# Patient Record
Sex: Male | Born: 1960 | Race: White | Hispanic: No | Smoking: Current every day smoker
Health system: Southern US, Community
[De-identification: ages and names within clinical notes are randomized; demographics above are authoritative.]

## PROBLEM LIST (undated history)

## (undated) DIAGNOSIS — F1020 Alcohol dependence, uncomplicated: Secondary | ICD-10-CM

## (undated) DIAGNOSIS — M069 Rheumatoid arthritis, unspecified: Secondary | ICD-10-CM

## (undated) DIAGNOSIS — E7800 Pure hypercholesterolemia, unspecified: Secondary | ICD-10-CM

## (undated) DIAGNOSIS — K7460 Unspecified cirrhosis of liver: Secondary | ICD-10-CM

## (undated) DIAGNOSIS — M1990 Unspecified osteoarthritis, unspecified site: Secondary | ICD-10-CM

## (undated) DIAGNOSIS — J849 Interstitial pulmonary disease, unspecified: Secondary | ICD-10-CM

## (undated) DIAGNOSIS — R0600 Dyspnea, unspecified: Secondary | ICD-10-CM

## (undated) DIAGNOSIS — I2510 Atherosclerotic heart disease of native coronary artery without angina pectoris: Secondary | ICD-10-CM

## (undated) DIAGNOSIS — I10 Essential (primary) hypertension: Secondary | ICD-10-CM

## (undated) DIAGNOSIS — F419 Anxiety disorder, unspecified: Secondary | ICD-10-CM

## (undated) DIAGNOSIS — J8410 Pulmonary fibrosis, unspecified: Secondary | ICD-10-CM

## (undated) HISTORY — DX: Atherosclerotic heart disease of native coronary artery without angina pectoris: I25.10

## (undated) HISTORY — DX: Unspecified cirrhosis of liver: K74.60

## (undated) HISTORY — PX: KNEE CARTILAGE SURGERY: SHX688

## (undated) HISTORY — DX: Alcohol dependence, uncomplicated: F10.20

## (undated) HISTORY — DX: Interstitial pulmonary disease, unspecified: J84.9

## (undated) HISTORY — DX: Essential (primary) hypertension: I10

## (undated) HISTORY — PX: JOINT REPLACEMENT: SHX530

---

## 2002-07-16 ENCOUNTER — Ambulatory Visit (HOSPITAL_BASED_OUTPATIENT_CLINIC_OR_DEPARTMENT_OTHER): Admission: RE | Admit: 2002-07-16 | Discharge: 2002-07-16 | Payer: Self-pay | Admitting: Orthopedic Surgery

## 2002-09-28 ENCOUNTER — Emergency Department (HOSPITAL_COMMUNITY): Admission: EM | Admit: 2002-09-28 | Discharge: 2002-09-28 | Payer: Self-pay | Admitting: Emergency Medicine

## 2009-06-10 ENCOUNTER — Emergency Department (HOSPITAL_COMMUNITY): Admission: EM | Admit: 2009-06-10 | Discharge: 2009-06-10 | Payer: Self-pay | Admitting: Emergency Medicine

## 2010-04-11 LAB — DIFFERENTIAL
Basophils Absolute: 0.1 10*3/uL (ref 0.0–0.1)
Basophils Relative: 1 % (ref 0–1)
Lymphs Abs: 3.1 10*3/uL (ref 0.7–4.0)
Monocytes Absolute: 0.6 10*3/uL (ref 0.1–1.0)
Neutro Abs: 8.3 10*3/uL — ABNORMAL HIGH (ref 1.7–7.7)
Neutrophils Relative %: 68 % (ref 43–77)

## 2010-04-11 LAB — URINALYSIS, ROUTINE W REFLEX MICROSCOPIC
Bilirubin Urine: NEGATIVE
Glucose, UA: NEGATIVE mg/dL
Hgb urine dipstick: NEGATIVE
Ketones, ur: NEGATIVE mg/dL
Leukocytes, UA: NEGATIVE
Nitrite: NEGATIVE
Protein, ur: 30 mg/dL — AB
Specific Gravity, Urine: 1.026 (ref 1.005–1.030)
Urobilinogen, UA: 1 mg/dL (ref 0.0–1.0)
pH: 8 (ref 5.0–8.0)

## 2010-04-11 LAB — URINE MICROSCOPIC-ADD ON

## 2010-04-11 LAB — RAPID URINE DRUG SCREEN, HOSP PERFORMED
Amphetamines: NOT DETECTED
Barbiturates: NOT DETECTED
Benzodiazepines: NOT DETECTED
Cocaine: NOT DETECTED
Opiates: NOT DETECTED
Tetrahydrocannabinol: POSITIVE — AB

## 2010-04-11 LAB — CBC
Hemoglobin: 16.3 g/dL (ref 13.0–17.0)
MCHC: 34 g/dL (ref 30.0–36.0)
Platelets: 182 10*3/uL (ref 150–400)
WBC: 12.3 10*3/uL — ABNORMAL HIGH (ref 4.0–10.5)

## 2010-04-11 LAB — BASIC METABOLIC PANEL
BUN: 12 mg/dL (ref 6–23)
CO2: 25 mEq/L (ref 19–32)
Calcium: 9.4 mg/dL (ref 8.4–10.5)
Chloride: 107 mEq/L (ref 96–112)
Creatinine, Ser: 0.87 mg/dL (ref 0.4–1.5)
GFR calc Af Amer: >60 mL/min (ref >60)
GFR calc non Af Amer: >60 mL/min (ref >60)
Glucose, Bld: 105 mg/dL — ABNORMAL HIGH (ref 70–99)
Potassium: 3.4 mEq/L — ABNORMAL LOW (ref 3.5–5.1)
Sodium: 139 mEq/L (ref 135–145)

## 2010-04-11 LAB — POCT CARDIAC MARKERS: CKMB, poc: 1.9 ng/mL (ref 1.0–8.0)

## 2010-06-10 NOTE — Op Note (Signed)
NAME:  Robert Greene, Robert Greene                       ACCOUNT NO.:  [id number]   MEDICAL RECORD NO.:  [id number]                   PATIENT TYPE:  AMB   LOCATION:  DSC                                  FACILITY:  [facility]   PHYSICIAN:  Feliberto Gottron. Turner Daniels, M.D.                DATE OF BIRTH:  02-19-1960   DATE OF PROCEDURE:  07/16/2002  DATE OF DISCHARGE:                                 OPERATIVE REPORT   PREOPERATIVE DIAGNOSES:  Left knee medial meniscal tear.   POSTOPERATIVE DIAGNOSES:  Left knee bucket handle medial meniscal tear plus  osteochondral loose body.   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   SURGEON:  Feliberto Gottron. Turner Daniels, M.D.   ASSISTANTLaural Benes. Jannet Mantis.   ANESTHESIA:  General endotracheal.   ESTIMATED BLOOD LOSS:  Minimal.   FLUIDS REPLACED:  crystalloids.   DRAINS:  None.   TOURNIQUET TIME:  None.   INDICATIONS FOR PROCEDURE:  The patient is a 50 year old man who presented  to our office yesterday with a locked knee consistent with a bucket handle  tear of the medial meniscus.  Some years ago he had had a large open  procedure done by Dr. Orland Jarred, I believe this was done 30 years ago,  probably a medial meniscectomy.  York Spaniel he was around 14 when it was done, but  he doesn't recall the exact nature of the operation.  In any event, over the  last few days, he spontaneously developed a locked knee with exquisite  medial joint line pain and what feels like a palpable either stump of a  meniscus or loose body on the medial side of the joint.  He is taken for  arthroscopic evaluation and treatment of his knee.  Preoperative radiographs  were consistent with very mild grade 1 [place] changes, good maintenance  of articular cartilage height and only some squaring off of the medial  tibial and femoral  condyles.   DESCRIPTION OF PROCEDURE:  The patient was identified by arm band and take  to the operating room at [facility].  Appropriate  anesthetic monitors were attached.  General endotracheal was induced with  the patient in the supine position.  A lateral post was applied to the table  and the left lower extremity prepped and draped in the usual sterile fashion  from the ankle to the midthigh.  Using a #11 blade, standard inferomedial  and inferolateral peripatellar portals were then made allowing introduction  of the arthroscope through the inferolateral portal and outflow through the  inferomedial portal.  We immediately encountered some hypertrophic synovium  and what appeared to be a fibrous band along the tract of where you would  normally expect the medial plica.  The patella and trochlea themselves were  in good condition.  When we cleaned the synovium off of what appeared to be  a fibrous band, it actually turned out to be a bucket handle tear of the  medial meniscus that tracked all the way down into the notch and medially  around the medial femoral condyle.  This was then removed with a 4.2 great  white sucker shaver.  Moving down the medial femoral condyle __________ were  identified an osteochondral loose body that was about 6 x 7 x 4 mm in size.  Still on its bed on the medial femoral condyle.  Far medial at the junction  of the synovium and the cartilage and this was also removed with an  arthroscopic rasper using the medial portal.  The ACL and the PCL were noted  to be intact.  The lateral compartment was in excellent condition.  The  gutters were cleared.  The lateral meniscus was in good condition.  The  scope was used to clear the posterior compartments going lateral to the PCL.  At this point the knee was washed out with normal saline solution.  The  arthroscopic instruments were removed.  A dressing of Xeroform, 4 x 4  dressing sponges, Webril  and Ace wrap was applied.  The patient was awakened  and taken to the recovery room without difficulty.                                                 Feliberto Gottron. Turner Daniels, M.D.    Ovid Curd  D:  07/16/2002  T:  07/17/2002  Job:  [id number]

## 2010-11-19 ENCOUNTER — Emergency Department (HOSPITAL_COMMUNITY)
Admission: EM | Admit: 2010-11-19 | Discharge: 2010-11-19 | Disposition: A | Discharge status: Home / Self Care | Payer: PRIVATE HEALTH INSURANCE | Attending: Emergency Medicine | Admitting: Emergency Medicine

## 2010-11-19 DIAGNOSIS — T887XXA Unspecified adverse effect of drug or medicament, initial encounter: Secondary | ICD-10-CM | POA: Insufficient documentation

## 2010-11-19 DIAGNOSIS — I10 Essential (primary) hypertension: Secondary | ICD-10-CM | POA: Insufficient documentation

## 2010-11-19 DIAGNOSIS — F17200 Nicotine dependence, unspecified, uncomplicated: Secondary | ICD-10-CM | POA: Insufficient documentation

## 2013-05-12 ENCOUNTER — Emergency Department (HOSPITAL_COMMUNITY)
Admission: EM | Admit: 2013-05-12 | Discharge: 2013-05-12 | Disposition: A | Discharge status: Home / Self Care | Payer: PRIVATE HEALTH INSURANCE | Attending: Emergency Medicine | Admitting: Emergency Medicine

## 2013-05-12 ENCOUNTER — Encounter (HOSPITAL_COMMUNITY): Payer: Self-pay | Admitting: Emergency Medicine

## 2013-05-12 DIAGNOSIS — I10 Essential (primary) hypertension: Secondary | ICD-10-CM | POA: Insufficient documentation

## 2013-05-12 DIAGNOSIS — L500 Allergic urticaria: Secondary | ICD-10-CM | POA: Insufficient documentation

## 2013-05-12 DIAGNOSIS — Z79899 Other long term (current) drug therapy: Secondary | ICD-10-CM | POA: Insufficient documentation

## 2013-05-12 DIAGNOSIS — F10929 Alcohol use, unspecified with intoxication, unspecified: Secondary | ICD-10-CM

## 2013-05-12 DIAGNOSIS — F1010 Alcohol abuse, uncomplicated: Secondary | ICD-10-CM | POA: Insufficient documentation

## 2013-05-12 DIAGNOSIS — E7800 Pure hypercholesterolemia, unspecified: Secondary | ICD-10-CM | POA: Insufficient documentation

## 2013-05-12 DIAGNOSIS — F17200 Nicotine dependence, unspecified, uncomplicated: Secondary | ICD-10-CM | POA: Insufficient documentation

## 2013-05-12 DIAGNOSIS — T7840XA Allergy, unspecified, initial encounter: Secondary | ICD-10-CM

## 2013-05-12 HISTORY — DX: Essential (primary) hypertension: I10

## 2013-05-12 HISTORY — DX: Pure hypercholesterolemia, unspecified: E78.00

## 2013-05-12 MED ORDER — PREDNISONE 50 MG PO TABS: 50.0000 mg | ORAL_TABLET | Freq: Every day | ORAL | Status: DC

## 2013-05-12 MED ORDER — DIPHENHYDRAMINE HCL 50 MG/ML IJ SOLN
25.0000 mg | Freq: Once | INTRAMUSCULAR | Status: AC
  Administered 2013-05-12: 25 mg via INTRAVENOUS
  Filled 2013-05-12: qty 1

## 2013-05-12 MED ORDER — METHYLPREDNISOLONE SODIUM SUCC 125 MG IJ SOLR: 125.0000 mg | Freq: Once | INTRAMUSCULAR | Status: DC

## 2013-05-12 MED ORDER — DIPHENHYDRAMINE HCL 25 MG PO TABS: 50.0000 mg | ORAL_TABLET | ORAL | Status: DC | PRN

## 2013-05-12 MED ORDER — METHYLPREDNISOLONE SODIUM SUCC 125 MG IJ SOLR
125.0000 mg | Freq: Once | INTRAMUSCULAR | Status: AC
  Administered 2013-05-12: 125 mg via INTRAVENOUS
  Filled 2013-05-12: qty 2

## 2013-05-12 NOTE — ED Provider Notes (Signed)
CSN: [id number]     Arrival date & time 05/12/13  0120 History   First MD Initiated Contact with Patient 05/12/13 0158     Chief Complaint  Patient presents with  . Allergic Reaction  . Urticaria  . Insect Bite     (Consider location/radiation/quality/duration/timing/severity/associated sxs/prior Treatment) HPI Patient admits to drinking heavily this evening. He sat on the curb around 10 PM. He thinks he was bitten by an insect or spider. He develop a raised rash to the right posterior thigh that is itching. He had no shortness of breath or wheezing. He states he did not have his EpiPen so they called EMS. Paramedics state patient Benadryl and Zantac. He states the itching and rash has since improved significantly. He continues to have no respiratory difficulties. Past Medical History  Diagnosis Date  . Hypertension   . Hypercholesteremia    History reviewed. No pertinent past surgical history. No family history on file. History  Substance Use Topics  . Smoking status: Current Every Day Smoker  . Smokeless tobacco: Not on file  . Alcohol Use: Yes    Review of Systems  Constitutional: Negative for fever and chills.  HENT: Negative for facial swelling, trouble swallowing and voice change.   Respiratory: Negative for shortness of breath and stridor.   Cardiovascular: Negative for chest pain.  Gastrointestinal: Negative for nausea, vomiting and abdominal pain.  Musculoskeletal: Negative for back pain and neck stiffness.  Skin: Positive for rash. Negative for wound.  Neurological: Negative for dizziness, weakness, light-headedness, numbness and headaches.  All other systems reviewed and are negative.     Allergies  Spider antivenin  Home Medications   Prior to Admission medications   Medication Sig Start Date End Date Taking? Authorizing Provider  EPINEPHrine (EPIPEN) 0.3 mg/0.3 mL SOAJ injection Inject 0.3 mg into the muscle once.   Yes Historical Provider, MD   lisinopril (PRINIVIL,ZESTRIL) 20 MG tablet Take 20 mg by mouth daily.   Yes Historical Provider, MD  Omega-3 Fatty Acids (FISH OIL PO) Take 2-4 capsules by mouth daily.   Yes Historical Provider, MD  simvastatin (ZOCOR) 80 MG tablet Take 80 mg by mouth daily.   Yes Historical Provider, MD   BP 147/94  Pulse 92  Temp(Src) 98.2 F (36.8 C) (Oral)  Resp 20  SpO2 97% Physical Exam  Nursing note and vitals reviewed. Constitutional: He is oriented to person, place, and time. He appears well-developed and well-nourished. No distress.  HENT:  Head: Normocephalic and atraumatic.  Mouth/Throat: Oropharynx is clear and moist.  No intraoral swelling  Eyes: EOM are normal. Pupils are equal, round, and reactive to light.  Neck: Normal range of motion. Neck supple.  Cardiovascular: Normal rate and regular rhythm.   Pulmonary/Chest: Effort normal and breath sounds normal. No respiratory distress. He has no wheezes. He has no rales. He exhibits no tenderness.  Abdominal: Soft. Bowel sounds are normal. He exhibits no distension and no mass. There is no tenderness. There is no rebound and no guarding.  Musculoskeletal: Normal range of motion. He exhibits no edema and no tenderness.  Neurological: He is alert and oriented to person, place, and time.  Clinically intoxicated. Slurring words. Moves all extremities without deficit.  Skin: Skin is warm and dry. Rash noted. No erythema.  Raised erythematous rash to the posterior right thigh. No evidence of infection.  Psychiatric: He has a normal mood and affect. His behavior is normal.    ED Course  Procedures (including critical care time)  Labs Review Labs Reviewed - No data to display  Imaging Review No results found.   EKG Interpretation None      MDM   Final diagnoses:  None      Patient is now much more alert. He's been observed in the emergency department for some time. He has no stridor or difficulty breathing. His rash is  improved. Return precautions have been given. Patient will be discharged home with family.  Julianne Rice, MD 05/12/13 [phone]

## 2013-05-12 NOTE — Discharge Instructions (Signed)
Alcohol Intoxication °Alcohol intoxication occurs when the amount of alcohol that a person has consumed impairs his or her ability to mentally and physically function. Alcohol directly impairs the normal chemical activity of the brain. Drinking large amounts of alcohol can lead to changes in mental function and behavior, and it can cause many physical effects that can be harmful.  °Alcohol intoxication can range in severity from mild to very severe. Various factors can affect the level of intoxication that occurs, such as the person's age, gender, weight, frequency of alcohol consumption, and the presence of other medical conditions (such as diabetes, seizures, or heart conditions). Dangerous levels of alcohol intoxication may occur when people drink large amounts of alcohol in a short period (binge drinking). Alcohol can also be especially dangerous when combined with certain prescription medicines or "recreational" drugs. °SIGNS AND SYMPTOMS °Some common signs and symptoms of mild alcohol intoxication include: °· Loss of coordination. °· Changes in mood and behavior. °· Impaired judgment. °· Slurred speech. °As alcohol intoxication progresses to more severe levels, other signs and symptoms will appear. These may include: °· Vomiting. °· Confusion and impaired memory. °· Slowed breathing. °· Seizures. °· Loss of consciousness. °DIAGNOSIS  °Your health care provider will take a medical history and perform a physical exam. You will be asked about the amount and type of alcohol you have consumed. Blood tests will be done to measure the concentration of alcohol in your blood. In many places, your blood alcohol level must be lower than 80 mg/dL (0.08%) to legally drive. However, many dangerous effects of alcohol can occur at much lower levels.  °TREATMENT  °People with alcohol intoxication often do not require treatment. Most of the effects of alcohol intoxication are temporary, and they go away as the alcohol naturally  leaves the body. Your health care provider will monitor your condition until you are stable enough to go home. Fluids are sometimes given through an IV access tube to help prevent dehydration.  °HOME CARE INSTRUCTIONS °· Do not drive after drinking alcohol. °· Stay hydrated. Drink enough water and fluids to keep your urine clear or pale yellow. Avoid caffeine.   °· Only take over-the-counter or prescription medicines as directed by your health care provider.   °SEEK MEDICAL CARE IF:  °· You have persistent vomiting.   °· You do not feel better after a few days. °· You have frequent alcohol intoxication. Your health care provider can help determine if you should see a substance use treatment counselor. °SEEK IMMEDIATE MEDICAL CARE IF:  °· You become shaky or tremble when you try to stop drinking.   °· You shake uncontrollably (seizure).   °· You throw up (vomit) blood. This may be bright red or may look like black coffee grounds.   °· You have blood in your stool. This may be bright red or may appear as a black, tarry, bad smelling stool.   °· You become lightheaded or faint.   °MAKE SURE YOU:  °· Understand these instructions. °· Will watch your condition. °· Will get help right away if you are not doing well or get worse. °Document Released: 10/19/2004 Document Revised: 09/11/2012 Document Reviewed: 06/14/2012 °ExitCare® Patient Information ©2014 [employer]. °Allergies °Allergies may happen from anything your body is sensitive to. This may be food, medicines, pollens, chemicals, and nearly anything around you in everyday life that produces allergens. An allergen is anything that causes an allergy producing substance. Heredity is often a factor in causing these problems. This means you may have some of the   same allergies as your parents. Food allergies happen in all age groups. Food allergies are some of the most severe and life threatening. Some common food allergies are cow's milk, seafood, eggs, nuts, wheat,  and soybeans. SYMPTOMS   Swelling around the mouth.  An itchy red rash or hives.  Vomiting or diarrhea.  Difficulty breathing. SEVERE ALLERGIC REACTIONS ARE LIFE-THREATENING. This reaction is called anaphylaxis. It can cause the mouth and throat to swell and cause difficulty with breathing and swallowing. In severe reactions only a trace amount of food (for example, peanut oil in a salad) may cause death within seconds. Seasonal allergies occur in all age groups. These are seasonal because they usually occur during the same season every year. They may be a reaction to molds, grass pollens, or tree pollens. Other causes of problems are house dust mite allergens, pet dander, and mold spores. The symptoms often consist of nasal congestion, a runny itchy nose associated with sneezing, and tearing itchy eyes. There is often an associated itching of the mouth and ears. The problems happen when you come in contact with pollens and other allergens. Allergens are the particles in the air that the body reacts to with an allergic reaction. This causes you to release allergic antibodies. Through a chain of events, these eventually cause you to release histamine into the blood stream. Although it is meant to be protective to the body, it is this release that causes your discomfort. This is why you were given anti-histamines to feel better. If you are unable to pinpoint the offending allergen, it may be determined by skin or blood testing. Allergies cannot be cured but can be controlled with medicine. Hay fever is a collection of all or some of the seasonal allergy problems. It may often be treated with simple over-the-counter medicine such as diphenhydramine. Take medicine as directed. Do not drink alcohol or drive while taking this medicine. Check with your caregiver or package insert for child dosages. If these medicines are not effective, there are many new medicines your caregiver can prescribe. Stronger  medicine such as nasal spray, eye drops, and corticosteroids may be used if the first things you try do not work well. Other treatments such as immunotherapy or desensitizing injections can be used if all else fails. Follow up with your caregiver if problems continue. These seasonal allergies are usually not life threatening. They are generally more of a nuisance that can often be handled using medicine. HOME CARE INSTRUCTIONS   If unsure what causes a reaction, keep a diary of foods eaten and symptoms that follow. Avoid foods that cause reactions.  If hives or rash are present:  Take medicine as directed.  You may use an over-the-counter antihistamine (diphenhydramine) for hives and itching as needed.  Apply cold compresses (cloths) to the skin or take baths in cool water. Avoid hot baths or showers. Heat will make a rash and itching worse.  If you are severely allergic:  Following a treatment for a severe reaction, hospitalization is often required for closer follow-up.  Wear a medic-alert bracelet or necklace stating the allergy.  You and your family must learn how to give adrenaline or use an anaphylaxis kit.  If you have had a severe reaction, always carry your anaphylaxis kit or EpiPen with you. Use this medicine as directed by your caregiver if a severe reaction is occurring. Failure to do so could have a fatal outcome. SEEK MEDICAL CARE IF:  You suspect a food allergy. Symptoms generally  happen within 30 minutes of eating a food. °· Your symptoms have not gone away within 2 days or are getting worse. °· You develop new symptoms. °· You want to retest yourself or your child with a food or drink you think causes an allergic reaction. Never do this if an anaphylactic reaction to that food or drink has happened before. Only do this under the care of a caregiver. °SEEK IMMEDIATE MEDICAL CARE IF:  °· You have difficulty breathing, are wheezing, or have a tight feeling in your chest or  throat. °· You have a swollen mouth, or you have hives, swelling, or itching all over your body. °· You have had a severe reaction that has responded to your anaphylaxis kit or an EpiPen®. These reactions may return when the medicine has worn off. These reactions should be considered life threatening. °MAKE SURE YOU:  °· Understand these instructions. °· Will watch your condition. °· Will get help right away if you are not doing well or get worse. °Document Released: 04/04/2002 Document Revised: 05/06/2012 Document Reviewed: 09/09/2007 °ExitCare® Patient Information ©2014 [employer]. ° °

## 2013-05-12 NOTE — ED Notes (Signed)
Pt states that he has used an epi pen before due to spider bite. Pt states that tonight he believes he sat on a spider and has a big welt. States that he could not find his epi pen and was nervous that he would stop breathing and so called EMS. States that he is feeling a lot better now in the room. States that he has drank a pint of liquor today.

## 2013-05-12 NOTE — ED Notes (Signed)
Dr  Yelverton in room.  

## 2013-05-12 NOTE — ED Notes (Addendum)
Here by EMS for allergic reaction, reports sudden onset of hives, redness and itching. Was working in yard and sat on a wall, thinks he got bit by a spider. Also admits to drinking a pint of liquor and smoking a cigarette prior to sx onset. Severe hives and itching upon EMS arrival. NSL 20g placed in L hand and benadryl and zantac given. Sx improved upon arrival to ED. H/o same type of reaction. Has an epi pen but did not use tonight (it is out of date).pt intoxicated, talkative, repetitive. LS CTA. Throat unremarkable.   ("Pt also reports having a friend upstairs on a ventilator who is suppose to be taken of of vent tomorrow. That is why he was drinking and he would like to see that pt & or the family upstairs").

## 2013-07-25 ENCOUNTER — Emergency Department (HOSPITAL_COMMUNITY)
Admission: EM | Admit: 2013-07-25 | Discharge: 2013-07-25 | Disposition: A | Discharge status: Home / Self Care | Payer: PRIVATE HEALTH INSURANCE | Attending: Emergency Medicine | Admitting: Emergency Medicine

## 2013-07-25 ENCOUNTER — Encounter (HOSPITAL_COMMUNITY): Payer: Self-pay | Admitting: Emergency Medicine

## 2013-07-25 DIAGNOSIS — I10 Essential (primary) hypertension: Secondary | ICD-10-CM | POA: Insufficient documentation

## 2013-07-25 DIAGNOSIS — Y9389 Activity, other specified: Secondary | ICD-10-CM | POA: Insufficient documentation

## 2013-07-25 DIAGNOSIS — T63461A Toxic effect of venom of wasps, accidental (unintentional), initial encounter: Secondary | ICD-10-CM | POA: Insufficient documentation

## 2013-07-25 DIAGNOSIS — F17200 Nicotine dependence, unspecified, uncomplicated: Secondary | ICD-10-CM | POA: Insufficient documentation

## 2013-07-25 DIAGNOSIS — Z79899 Other long term (current) drug therapy: Secondary | ICD-10-CM | POA: Insufficient documentation

## 2013-07-25 DIAGNOSIS — T63441A Toxic effect of venom of bees, accidental (unintentional), initial encounter: Secondary | ICD-10-CM

## 2013-07-25 DIAGNOSIS — IMO0002 Reserved for concepts with insufficient information to code with codable children: Secondary | ICD-10-CM | POA: Insufficient documentation

## 2013-07-25 DIAGNOSIS — E7800 Pure hypercholesterolemia, unspecified: Secondary | ICD-10-CM | POA: Insufficient documentation

## 2013-07-25 DIAGNOSIS — T6391XA Toxic effect of contact with unspecified venomous animal, accidental (unintentional), initial encounter: Secondary | ICD-10-CM | POA: Insufficient documentation

## 2013-07-25 DIAGNOSIS — Y9289 Other specified places as the place of occurrence of the external cause: Secondary | ICD-10-CM | POA: Insufficient documentation

## 2013-07-25 MED ORDER — PREDNISONE 20 MG PO TABS: ORAL_TABLET | ORAL | Status: DC

## 2013-07-25 MED ORDER — METHYLPREDNISOLONE SODIUM SUCC 125 MG IJ SOLR
125.0000 mg | Freq: Once | INTRAMUSCULAR | Status: AC
  Administered 2013-07-25: 125 mg via INTRAVENOUS
  Filled 2013-07-25: qty 2

## 2013-07-25 MED ORDER — FAMOTIDINE 20 MG PO TABS: 20.0000 mg | ORAL_TABLET | Freq: Two times a day (BID) | ORAL | Status: DC

## 2013-07-25 MED ORDER — DIPHENHYDRAMINE HCL 25 MG PO TABS: 50.0000 mg | ORAL_TABLET | ORAL | Status: DC | PRN

## 2013-07-25 MED ORDER — SODIUM CHLORIDE 0.9 % IV BOLUS (SEPSIS)
1000.0000 mL | Freq: Once | INTRAVENOUS | Status: AC
  Administered 2013-07-25: 1000 mL via INTRAVENOUS

## 2013-07-25 MED ORDER — DIPHENHYDRAMINE HCL 50 MG/ML IJ SOLN
25.0000 mg | Freq: Once | INTRAMUSCULAR | Status: AC
  Administered 2013-07-25: 25 mg via INTRAVENOUS
  Filled 2013-07-25: qty 1

## 2013-07-25 MED ORDER — HYDROMORPHONE HCL PF 1 MG/ML IJ SOLN
0.5000 mg | Freq: Once | INTRAMUSCULAR | Status: AC
  Administered 2013-07-25: 14:00:00 via INTRAVENOUS
  Filled 2013-07-25: qty 1

## 2013-07-25 MED ORDER — FAMOTIDINE IN NACL 20-0.9 MG/50ML-% IV SOLN
20.0000 mg | Freq: Once | INTRAVENOUS | Status: AC
  Administered 2013-07-25: 20 mg via INTRAVENOUS
  Filled 2013-07-25: qty 50

## 2013-07-25 MED ORDER — EPINEPHRINE 0.3 MG/0.3ML IJ SOAJ
0.3000 mg | Freq: Once | INTRAMUSCULAR | Status: AC
  Administered 2013-07-25: 0.3 mg via INTRAMUSCULAR
  Filled 2013-07-25: qty 0.3

## 2013-07-25 MED ORDER — ONDANSETRON HCL 4 MG/2ML IJ SOLN
4.0000 mg | Freq: Once | INTRAMUSCULAR | Status: AC
  Administered 2013-07-25: 4 mg via INTRAVENOUS
  Filled 2013-07-25: qty 2

## 2013-07-25 NOTE — ED Notes (Signed)
Pt anxious, asking to go home.  Explained we need to continue to observe him for a while to ensure safe discharge home.  Swelling to face much improved, lips normal, ear lobes only slightly swollen.  Hives less pronounced but remain.

## 2013-07-25 NOTE — ED Provider Notes (Signed)
CSN: [id number]     Arrival date & time 07/25/13  1159 History   First MD Initiated Contact with Patient 07/25/13 1202     Chief Complaint  Patient presents with  . Allergic Reaction     (Consider location/radiation/quality/duration/timing/severity/associated sxs/prior Treatment) HPI  53 year old male with history of bee sting reaction who presents for evaluation of bee sting. Approximately 2-1/2 hours ago patient was outside fixing in a condition when he got stung by multiple bees. Reports things was to his right lower leg. Subsequently patient developed his, itchiness, and chills. Patient received 1 EpiPen injection and 50 of Benadryl prior to arrival. Currently patient complains of hives and chills, but denies any significant headache, throat swelling, tongue swelling, trouble breathing, nausea vomiting diarrhea with exception of some mild abdominal cramping.    Past Medical History  Diagnosis Date  . Hypertension   . Hypercholesteremia    No past surgical history on file. No family history on file. History  Substance Use Topics  . Smoking status: Current Every Day Smoker  . Smokeless tobacco: Not on file  . Alcohol Use: Yes    Review of Systems  All other systems reviewed and are negative.     Allergies  Spider antivenin  Home Medications   Prior to Admission medications   Medication Sig Start Date End Date Taking? Authorizing Provider  diphenhydrAMINE (BENADRYL) 25 MG tablet Take 2 tablets (50 mg total) by mouth every 4 (four) hours as needed for itching. 05/12/13   Julianne Rice, MD  EPINEPHrine (EPIPEN) 0.3 mg/0.3 mL SOAJ injection Inject 0.3 mg into the muscle once.    Historical Provider, MD  lisinopril (PRINIVIL,ZESTRIL) 20 MG tablet Take 20 mg by mouth daily.    Historical Provider, MD  Omega-3 Fatty Acids (FISH OIL PO) Take 2-4 capsules by mouth daily.    Historical Provider, MD  predniSONE (DELTASONE) 50 MG tablet Take 1 tablet (50 mg total) by mouth daily.  05/12/13   Julianne Rice, MD  simvastatin (ZOCOR) 80 MG tablet Take 80 mg by mouth daily.    Historical Provider, MD   There were no vitals taken for this visit. Physical Exam  Constitutional: He is oriented to person, place, and time. He appears well-developed and well-nourished. No distress.  HENT:  Head: Atraumatic.  No mucosal swelling, no tongue swelling, no lip swelling noted  Eyes: Conjunctivae are normal.  Neck: Normal range of motion. Neck supple.  Cardiovascular: Normal rate and regular rhythm.   Pulmonary/Chest: Effort normal and breath sounds normal. He has no wheezes.  Abdominal: Soft. There is no tenderness.  Musculoskeletal: He exhibits no edema.  Neurological: He is alert and oriented to person, place, and time.  Skin: No rash (pruritic hives noted throughout body.) noted.  Psychiatric: He has a normal mood and affect.    ED Course  Procedures (including critical care time)  Patient presents with acute allergic reaction to bee sting. No identified stinger noted on exam. Patient has pruritic hives throughout body without signs of airway compromise.  Mild abdominal cramping, which suggest some evidence of anaphylaxis. He did receive EpiPen injection prior to arrival. Will give Solu-Medrol, Benadryl, Pepcid, IV fluid, and we'll closely monitor. Discussed with Dr. Aline Brochure  1:39 PM Pt now complaining of face tightness and having body aches.  No obvious signs of airway compromise on reexamination mild edema noted to L lower lip and bilateral ear lobes.  will give 0.3 epi IM and continue to monitor.    5:18 PM  Patient received a total of 3 intramuscular epinephrine along with Solu-Medrol, Benadryl, and Pepcid. Patient was monitored for 5 hours with improvement. Patient felt stable for discharge. He is currently without any airway compromise. He does have EpiPen at home that is up-to-date. Patient was made aware to use EpiPen as needed and to also come to the ER promptly if he  get stung again. He agrees. Return precautions discussed.  CRITICAL CARE Performed by: Domenic Moras Total critical care time: 1hr Critical care time was exclusive of separately billable procedures and treating other patients. Critical care was necessary to treat or prevent imminent or life-threatening deterioration. Critical care was time spent personally by me on the following activities: development of treatment plan with patient and/or surrogate as well as nursing, discussions with consultants, evaluation of patient's response to treatment, examination of patient, obtaining history from patient or surrogate, ordering and performing treatments and interventions, ordering and review of laboratory studies, ordering and review of radiographic studies, pulse oximetry and re-evaluation of patient's condition.   Labs Review Labs Reviewed - No data to display  Imaging Review No results found.   EKG Interpretation   Date/Time:  Friday July 25 2013 12:32:28 EDT Ventricular Rate:  68 PR Interval:  161 QRS Duration: 78 QT Interval:  401 QTC Calculation: 426 R Axis:   49 Text Interpretation:  Sinus rhythm Left atrial enlargement Anteroseptal  infarct, old Confirmed by HARRISON  MD, [place] ([id number]) on 07/25/2013  12:34:57 PM      MDM   Final diagnoses:  Bee sting reaction, accidental or unintentional, initial encounter  Anaphylactic reaction to bee sting, accidental or unintentional, initial encounter    BP 121/72  Pulse 74  Temp(Src) 98.1 F (36.7 C) (Oral)  Resp 15  Ht 5\' 10"  (1.778 m)  Wt 205 lb (92.987 kg)  BMI 29.41 kg/m2  SpO2 98%     Domenic Moras, PA-C 07/25/13 1720

## 2013-07-25 NOTE — ED Notes (Signed)
Stung by a bee posterior right leg, epi pen used at home and took 50mg  benadryl prior to EMS arrival.  Hives noted all over body, pt shakey.

## 2013-07-25 NOTE — ED Notes (Signed)
Pt face more swollen, ear lobes very puffy, eyes more swollen.  Dr Aline Brochure into room.

## 2013-07-25 NOTE — Discharge Instructions (Signed)

## 2013-07-25 NOTE — ED Notes (Signed)
Swelling subsiding minimally; pt feels his lips are less tingling. Continues to deny respiratory distress.  No signs of abnormal respirations noted.

## 2013-07-25 NOTE — ED Notes (Signed)
Skin normal in color, minimal ear lobe swelling remains.  Lungs clear.  Pt more than ready to go home.

## 2013-07-25 NOTE — ED Notes (Signed)
C/O bee sting at 0930.  Went home and used epi pen, then took benadryl.  Felt better then suddenly became worse, called 911.  Face swollen, feels as though mouth is swollen, hives cover body.  C/O knot epigastric area.  Suddenly felt nausea, medication given here.

## 2013-07-26 ENCOUNTER — Emergency Department (HOSPITAL_COMMUNITY)
Admission: EM | Admit: 2013-07-26 | Discharge: 2013-07-26 | Disposition: A | Discharge status: Home / Self Care | Payer: PRIVATE HEALTH INSURANCE | Attending: Emergency Medicine | Admitting: Emergency Medicine

## 2013-07-26 ENCOUNTER — Encounter (HOSPITAL_COMMUNITY): Payer: Self-pay | Admitting: Emergency Medicine

## 2013-07-26 DIAGNOSIS — T63441S Toxic effect of venom of bees, accidental (unintentional), sequela: Secondary | ICD-10-CM

## 2013-07-26 DIAGNOSIS — E7800 Pure hypercholesterolemia, unspecified: Secondary | ICD-10-CM | POA: Insufficient documentation

## 2013-07-26 DIAGNOSIS — T63461A Toxic effect of venom of wasps, accidental (unintentional), initial encounter: Secondary | ICD-10-CM | POA: Insufficient documentation

## 2013-07-26 DIAGNOSIS — IMO0002 Reserved for concepts with insufficient information to code with codable children: Secondary | ICD-10-CM | POA: Insufficient documentation

## 2013-07-26 DIAGNOSIS — Y939 Activity, unspecified: Secondary | ICD-10-CM | POA: Insufficient documentation

## 2013-07-26 DIAGNOSIS — Z79899 Other long term (current) drug therapy: Secondary | ICD-10-CM | POA: Insufficient documentation

## 2013-07-26 DIAGNOSIS — Y929 Unspecified place or not applicable: Secondary | ICD-10-CM | POA: Insufficient documentation

## 2013-07-26 DIAGNOSIS — I10 Essential (primary) hypertension: Secondary | ICD-10-CM | POA: Insufficient documentation

## 2013-07-26 DIAGNOSIS — T6391XA Toxic effect of contact with unspecified venomous animal, accidental (unintentional), initial encounter: Secondary | ICD-10-CM | POA: Insufficient documentation

## 2013-07-26 DIAGNOSIS — F17200 Nicotine dependence, unspecified, uncomplicated: Secondary | ICD-10-CM | POA: Insufficient documentation

## 2013-07-26 LAB — CBC WITH DIFFERENTIAL/PLATELET
BASOS ABS: 0 10*3/uL (ref 0.0–0.1)
Basophils Relative: 0 % (ref 0–1)
EOS PCT: 0 % (ref 0–5)
Eosinophils Absolute: 0 10*3/uL (ref 0.0–0.7)
HCT: 47.3 % (ref 39.0–52.0)
Hemoglobin: 16.2 g/dL (ref 13.0–17.0)
Lymphocytes Relative: 13 % (ref 12–46)
Lymphs Abs: 2.4 10*3/uL (ref 0.7–4.0)
MCH: 31.6 pg (ref 26.0–34.0)
MCHC: 34.2 g/dL (ref 30.0–36.0)
MCV: 92.2 fL (ref 78.0–100.0)
Monocytes Absolute: 0.7 10*3/uL (ref 0.1–1.0)
Monocytes Relative: 4 % (ref 3–12)
Neutro Abs: 15.2 10*3/uL — ABNORMAL HIGH (ref 1.7–7.7)
Neutrophils Relative %: 83 % — ABNORMAL HIGH (ref 43–77)
Platelets: 155 10*3/uL (ref 150–400)
RBC: 5.13 MIL/uL (ref 4.22–5.81)
RDW: 13.2 % (ref 11.5–15.5)
WBC: 18.4 10*3/uL — ABNORMAL HIGH (ref 4.0–10.5)

## 2013-07-26 LAB — BASIC METABOLIC PANEL
ANION GAP: 13 (ref 5–15)
BUN: 13 mg/dL (ref 6–23)
CALCIUM: 9.3 mg/dL (ref 8.4–10.5)
CO2: 23 mEq/L (ref 19–32)
CREATININE: 0.63 mg/dL (ref 0.50–1.35)
Chloride: 103 mEq/L (ref 96–112)
GFR calc Af Amer: >90 mL/min (ref >90)
GFR calc non Af Amer: >90 mL/min (ref >90)
Glucose, Bld: 109 mg/dL — ABNORMAL HIGH (ref 70–99)
Potassium: 4.4 mEq/L (ref 3.7–5.3)
Sodium: 139 mEq/L (ref 137–147)

## 2013-07-26 MED ORDER — SODIUM CHLORIDE 0.9 % IV SOLN
INTRAVENOUS | Status: DC
Start: 2013-07-26 — End: 2013-07-26
  Administered 2013-07-26: 15:00:00 via INTRAVENOUS

## 2013-07-26 MED ORDER — EPINEPHRINE 0.3 MG/0.3ML IJ SOAJ
0.3000 mg | Freq: Once | INTRAMUSCULAR | Status: AC
Start: 2013-07-26 — End: 2013-07-26
  Administered 2013-07-26: 0.3 mg via INTRAMUSCULAR

## 2013-07-26 MED ORDER — SODIUM CHLORIDE 0.9 % IV BOLUS (SEPSIS)
1000.0000 mL | Freq: Once | INTRAVENOUS | Status: AC
  Administered 2013-07-26: 1000 mL via INTRAVENOUS

## 2013-07-26 MED ORDER — FAMOTIDINE IN NACL 20-0.9 MG/50ML-% IV SOLN
20.0000 mg | Freq: Once | INTRAVENOUS | Status: AC
  Administered 2013-07-26: 20 mg via INTRAVENOUS
  Filled 2013-07-26: qty 50

## 2013-07-26 MED ORDER — DIPHENHYDRAMINE HCL 50 MG/ML IJ SOLN
25.0000 mg | Freq: Once | INTRAMUSCULAR | Status: AC
  Administered 2013-07-26: 25 mg via INTRAVENOUS
  Filled 2013-07-26: qty 1

## 2013-07-26 NOTE — ED Provider Notes (Signed)
Medical screening examination/treatment/procedure(s) were conducted as a shared visit with non-physician practitioner(s) and myself.  I personally evaluated the patient during the encounter.   EKG Interpretation   Date/Time:  Friday July 25 2013 12:32:28 EDT Ventricular Rate:  68 PR Interval:  161 QRS Duration: 78 QT Interval:  401 QTC Calculation: 426 R Axis:   49 Text Interpretation:  Sinus rhythm Left atrial enlargement Anteroseptal  infarct, old Confirmed by Sibel Khurana  MD, [place] ([id number]) on 07/25/2013  12:34:57 PM      I interviewed and examined the patient. Lungs are CTAB. Cardiac exam wnl. Abdomen soft. Diffuse urticarial rash. Gi cramping on my exam. Pt gave him self epi IM pta. On exam he began developing swelling of his lips, ears, eye lids. He got a total of 3 IM epi's. He was monitored for 5 hours. Rash and swelling resolving. Pt has an epi pen at home. He continues to appear well. Strong return precautions given.    Blanchard Kelch, MD 07/26/13 [phone]

## 2013-07-26 NOTE — Discharge Instructions (Signed)
Anaphylactic Reaction °An anaphylactic reaction is a sudden, severe allergic reaction that involves the whole body. It can be life threatening. A hospital stay is often required. People with asthma, eczema, or hay fever are slightly more likely to have an anaphylactic reaction. °CAUSES  °An anaphylactic reaction may be caused by anything to which you are allergic. After being exposed to the allergic substance, your immune system becomes sensitized to it. When you are exposed to that allergic substance again, an allergic reaction can occur. Common causes of an anaphylactic reaction include: °· Medicines. °· Foods, especially peanuts, wheat, shellfish, milk, and eggs. °· Insect bites or stings. °· Blood products. °· Chemicals, such as dyes, latex, and contrast material used for imaging tests. °SYMPTOMS  °When an allergic reaction occurs, the body releases histamine and other substances. These substances cause symptoms such as tightening of the airway. Symptoms often develop within seconds or minutes of exposure. Symptoms may include: °· Skin rash or hives. °· Itching. °· Chest tightness. °· Swelling of the eyes, tongue, or lips. °· Trouble breathing or swallowing. °· Lightheadedness or fainting. °· Anxiety or confusion. °· Stomach pains, vomiting, or diarrhea. °· Nasal congestion. °· A fast or irregular heartbeat (palpitations). °DIAGNOSIS  °Diagnosis is based on your history of recent exposure to allergic substances, your symptoms, and a physical exam. Your caregiver may also perform blood or urine tests to confirm the diagnosis. °TREATMENT  °Epinephrine medicine is the main treatment for an anaphylactic reaction. Other medicines that may be used for treatment include antihistamines, steroids, and albuterol. In severe cases, fluids and medicine to support blood pressure may be given through an intravenous line (IV). Even if you improve after treatment, you need to be observed to make sure your condition does not get  worse. This may require a stay in the hospital. °HOME CARE INSTRUCTIONS  °· Wear a medical alert bracelet or necklace stating your allergy. °· You and your family must learn how to use an anaphylaxis kit or give an epinephrine injection to temporarily treat an emergency allergic reaction. Always carry your epinephrine injection or anaphylaxis kit with you. This can be lifesaving if you have a severe reaction. °· Do not drive or perform tasks after treatment until the medicines used to treat your reaction have worn off, or until your caregiver says it is okay. °· If you have hives or a rash: °¨ Take medicines as directed by your caregiver. °¨ You may use an over-the-counter antihistamine (diphenhydramine) as needed. °¨ Apply cold compresses to the skin or take baths in cool water. Avoid hot baths or showers. °SEEK MEDICAL CARE IF:  °· You develop symptoms of an allergic reaction to a new substance. Symptoms may start right away or minutes later. °· You develop a rash, hives, or itching. °· You develop new symptoms. °SEEK IMMEDIATE MEDICAL CARE IF:  °· You have swelling of the mouth, difficulty breathing, or wheezing. °· You have a tight feeling in your chest or throat. °· You develop hives, swelling, or itching all over your body. °· You develop severe vomiting or diarrhea. °· You feel faint or pass out. °This is an emergency. Use your epinephrine injection or anaphylaxis kit as you have been instructed. Call your local emergency services (911 in [place] Even if you improve after the injection, you need to be examined at a hospital emergency department. °MAKE SURE YOU:  °· Understand these instructions. °· Will watch your condition. °· Will get help right away if you are not   doing well or get worse. Document Released: 01/09/2005 Document Revised: 01/14/2013 Document Reviewed: 04/12/2011 Exit[employer] Patient Information 2015 [employer]. This information is not intended to replace advice given to you by your health  care provider. Make sure you discuss any questions you have with your health care provider. Epinephrine Injection Epinephrine is a medicine given by injection to temporarily treat an emergency allergic reaction. It is also used to treat severe asthmatic attacks and other lung problems. The medicine helps to enlarge (dilate) the small breathing tubes of the lungs. A life-threatening, sudden allergic reaction that involves the whole body is called anaphylaxis. Because of potential side effects, epinephrine should only be used as directed by your caregiver. RISKS AND COMPLICATIONS Possible side effects of epinephrine injections include:  Chest pain.  Irregular or rapid heartbeat.  Shortness of breath.  Nausea.  Vomiting.  Abdominal pain or cramping.  Sweating.  Dizziness.  Weakness.  Headache.  Nervousness. Report all side effects to your caregiver. HOW TO GIVE AN EPINEPHRINE INJECTION Give the epinephrine injection immediately when symptoms of a severe reaction begin. Inject the medicine into the outer thigh or any available, large muscle. Your caregiver can teach you how to do this. You do not need to remove any clothing. After the injection, call your local emergency services (911 in [place] Even if you improve after the injection, you need to be examined at a hospital emergency department. Epinephrine works quickly, but it also wears off quickly. Delayed reactions can occur. A delayed reaction may be as serious and dangerous as the initial reaction. HOME CARE INSTRUCTIONS  Make sure you and your family know how to give an epinephrine injection.  Use epinephrine injections as directed by your caregiver. Do not use this medicine more often or in larger doses than prescribed.  Always carry your epinephrine injection or anaphylaxis kit with you. This can be lifesaving if you have a severe reaction.  Store the medicine in a cool, dry place. If the medicine becomes discolored or  cloudy, dispose of it properly and replace it with new medicine.  Check the expiration date on your medicine. It may be unsafe to use medicines past their expiration date.  Tell your caregiver about any other medicines you are taking. Some medicines can react badly with epinephrine.  Tell your caregiver about any medical conditions you have, such as diabetes, high blood pressure (hypertension), heart disease, irregular heartbeats, or if you are pregnant. SEEK IMMEDIATE MEDICAL CARE IF:  You have used an epinephrine injection. Call your local emergency services (911 in [place] Even if you improve after the injection, you need to be examined at a hospital emergency department to make sure your allergic reaction is under control. You will also be monitored for adverse effects from the medicine.  You have chest pain.  You have irregular or fast heartbeats.  You have shortness of breath.  You have severe headaches.  You have severe nausea, vomiting, or abdominal cramps.  You have severe pain, swelling, or redness in the area where you gave the injection. Document Released: 01/07/2000 Document Revised: 04/03/2011 Document Reviewed: 09/28/2010 [employer] Patient Information 2015 [employer]. This information is not intended to replace advice given to you by your health care provider. Make sure you discuss any questions you have with your health care provider.  

## 2013-07-26 NOTE — ED Notes (Signed)
Pt in via EMS, in c/o allergic reaction, pt seen for same yesterday after a bee sting, given epi three times during stay yesterday, today had recurrence of symptoms, first noted hives and rash, went to fire station and EMS was called, noted hoarseness to his voice and noted airway swelling- given 0.15mg  of EPI, 5mg  albuterol, 50mg  benadryl, 50mg  zantac, and 125mg  solumedrol. Hoarseness noted on arrival with continued oral swelling which does not look improved via EMS. PA to bedside.

## 2013-07-26 NOTE — ED Provider Notes (Signed)
CSN: [id number]     Arrival date & time 07/26/13  1135 History   First MD Initiated Contact with Patient 07/26/13 1140     Chief Complaint  Patient presents with  . Allergic Reaction     (Consider location/radiation/quality/duration/timing/severity/associated sxs/prior Treatment) HPI  53 year old male brought in via EMS for worsening allergic reaction. Patient was seen yesterday in ER by me for evaluation of an allergic reaction to bee sting. He had a mild anaphylactic reaction, and received 3 separate dose of epinephrine along with cadre of meds including Benadryl, Pepcid, IV fluid, Solu-Medrol. He was monitored for 5 hours and was improving. He was discharge with strong return precautions. He was discharged with prescription for steroid, pepcid, benadryl but did not fill his meds.  Patient report he woke up this morning not feeling well, tightness sensation around the throat and noticed that his voice was hoarse. He then noticed return of his hives and itchiness. He then went to the closest fire station and EMS was called. Prior to arrival patient received 0.15 mg epi, 5 mg of albuterol, 50 mg of Benadryl, and 50 mg of Zantac along with 125 mg of soluMedrol.  He is currently complaining of difficulty breathing, feeling nauseous, and tightness throughout. No reexposure to bee sting or any other offending agent.  Hx limited due to acuity of the pt's condition.    Past Medical History  Diagnosis Date  . Hypertension   . Hypercholesteremia   . Hypercholesteremia    Past Surgical History  Procedure Laterality Date  . Knee arthroplasty Left    History reviewed. No pertinent family history. History  Substance Use Topics  . Smoking status: Current Every Day Smoker -- 1.50 packs/day    Types: Cigarettes  . Smokeless tobacco: Not on file  . Alcohol Use: 0.5 oz/week    1 drink(s) per week    Review of Systems  Unable to perform ROS: Acuity of condition      Allergies  Bee venom and  Spider antivenin  Home Medications   Prior to Admission medications   Medication Sig Start Date End Date Taking? Authorizing Provider  diphenhydrAMINE (BENADRYL) 25 MG tablet Take 2 tablets (50 mg total) by mouth every 4 (four) hours as needed for itching or allergies. 07/25/13   Domenic Moras, PA-C  EPINEPHrine (EPIPEN) 0.3 mg/0.3 mL SOAJ injection Inject 0.3 mg into the muscle once.    Historical Provider, MD  famotidine (PEPCID) 20 MG tablet Take 1 tablet (20 mg total) by mouth 2 (two) times daily. 07/25/13   Domenic Moras, PA-C  lisinopril (PRINIVIL,ZESTRIL) 20 MG tablet Take 20 mg by mouth daily.    Historical Provider, MD  Omega-3 Fatty Acids (FISH OIL PO) Take 2 capsules by mouth 2 (two) times daily.     Historical Provider, MD  predniSONE (DELTASONE) 20 MG tablet 2 tabs po daily x 4 days 07/25/13   Domenic Moras, PA-C  simvastatin (ZOCOR) 80 MG tablet Take 80 mg by mouth daily.    Historical Provider, MD   BP 160/98  Pulse 63  Resp 22  SpO2 99% Physical Exam  Nursing note and vitals reviewed. Constitutional: He is oriented to person, place, and time.  Patient is sitting upright, hoarseness noted in mild distress.  HENT:  Head: Normocephalic and atraumatic.  Mild lip edema, and tongue edema. No stridor. Hoarsed voice  Eyes: Conjunctivae are normal.  Neck: No tracheal deviation present.  Cardiovascular: Normal rate and regular rhythm.   Pulmonary/Chest: Effort normal  and breath sounds normal. He has no wheezes.  Abdominal: Soft. There is no tenderness.  Neurological: He is alert and oriented to person, place, and time.  Skin:  The skin is flushed, hives noted throughout body less intense than yesterday.    ED Course  Procedures (including critical care time)  12:00 PM Pt with anaphylactic reaction from bee sting from yesterday, improves with treatment in ER but now worsen this AM.  On exam pt has mild resp distress, with some airway compromise given hoarsed voice and mild lip and tongue  edema.  Pt is hemodynamically stable, normal heart rate.  Appropriate treatment was given prior to arrival.  IVF continues in ER.  Another dose of 0.3mg  Epi IM given promptly upon arrival.  Worsening of sxs likely because pt did not continue with his steroid treatment as previously prescribed.    1:00 PM No worsening of sxs after reexamination.  Plan to monitor for the next 4-6 hrs.  Care discussed with Dr. Roderic Palau.   Patient has elevated WBC of 18.4. This is likely secondary to stress reaction and recent steroid use.  2:56 PM Pt felt better, voice is improving but not back to normal, however hives has nearly resolved.  Will continue to monitor.  Will continue with IVF, benadryl/pepcid.  5:03 PM Patient state he felt much better he feels comfortable returning home. Patient agrees to resume his medication previously prescribed. He is aware to return promptly if his symptoms worsen. He is to follow with his PCP for further management.  Labs Review Labs Reviewed  CBC WITH DIFFERENTIAL - Abnormal; Notable for the following:    WBC 18.4 (*)    Neutrophils Relative % 83 (*)    Neutro Abs 15.2 (*)    All other components within normal limits  BASIC METABOLIC PANEL - Abnormal; Notable for the following:    Glucose, Bld 109 (*)    All other components within normal limits    Imaging Review No results found.   EKG Interpretation None      MDM   Final diagnoses:  Bee sting-induced anaphylaxis, accidental or unintentional, sequela    BP 131/62  Pulse 78  Resp 19  SpO2 98%     Domenic Moras, PA-C 07/26/13 1704

## 2013-07-26 NOTE — ED Provider Notes (Signed)
Medical screening examination/treatment/procedure(s) were conducted as a shared visit with non-physician practitioner(s) and myself.  I personally evaluated the patient during the encounter.   EKG Interpretation None      Pt complains of an allergic reaction.  pe rash to torso with swelling to face  Maudry Diego, MD 07/26/13 1945

## 2015-04-02 ENCOUNTER — Emergency Department (HOSPITAL_COMMUNITY)
Admission: EM | Admit: 2015-04-02 | Discharge: 2015-04-03 | Disposition: A | Discharge status: Home / Self Care | Payer: PRIVATE HEALTH INSURANCE | Attending: Emergency Medicine | Admitting: Emergency Medicine

## 2015-04-02 ENCOUNTER — Encounter (HOSPITAL_COMMUNITY): Payer: Self-pay | Admitting: Oncology

## 2015-04-02 DIAGNOSIS — E7800 Pure hypercholesterolemia, unspecified: Secondary | ICD-10-CM | POA: Diagnosis not present

## 2015-04-02 DIAGNOSIS — Z7952 Long term (current) use of systemic steroids: Secondary | ICD-10-CM | POA: Insufficient documentation

## 2015-04-02 DIAGNOSIS — I10 Essential (primary) hypertension: Secondary | ICD-10-CM | POA: Insufficient documentation

## 2015-04-02 DIAGNOSIS — H578 Other specified disorders of eye and adnexa: Secondary | ICD-10-CM | POA: Diagnosis present

## 2015-04-02 DIAGNOSIS — R531 Weakness: Secondary | ICD-10-CM | POA: Diagnosis not present

## 2015-04-02 DIAGNOSIS — F17210 Nicotine dependence, cigarettes, uncomplicated: Secondary | ICD-10-CM | POA: Insufficient documentation

## 2015-04-02 DIAGNOSIS — I6521 Occlusion and stenosis of right carotid artery: Secondary | ICD-10-CM

## 2015-04-02 DIAGNOSIS — Z79899 Other long term (current) drug therapy: Secondary | ICD-10-CM | POA: Insufficient documentation

## 2015-04-02 NOTE — ED Notes (Signed)
Pt reports attempting to go to work today however reports that he, "couldn't function."  "Didn't feel right."  Pt cannot expound on sx.  Does report difficulty keeping right eye open.  Speech is clear, grips equal b/l, face is symmetrical.

## 2015-04-03 ENCOUNTER — Emergency Department (HOSPITAL_COMMUNITY): Payer: PRIVATE HEALTH INSURANCE

## 2015-04-03 LAB — DIFFERENTIAL
BASOS ABS: 0 10*3/uL (ref 0.0–0.1)
Basophils Relative: 0 %
EOS ABS: 0.2 10*3/uL (ref 0.0–0.7)
Eosinophils Relative: 2 %
LYMPHS ABS: 5.5 10*3/uL — AB (ref 0.7–4.0)
Lymphocytes Relative: 54 %
MONO ABS: 0.7 10*3/uL (ref 0.1–1.0)
MONOS PCT: 7 %
Neutro Abs: 3.8 10*3/uL (ref 1.7–7.7)
Neutrophils Relative %: 37 %

## 2015-04-03 LAB — COMPREHENSIVE METABOLIC PANEL
ALT: 52 U/L (ref 17–63)
AST: 48 U/L — AB (ref 15–41)
Albumin: 4.1 g/dL (ref 3.5–5.0)
Alkaline Phosphatase: 71 U/L (ref 38–126)
Anion gap: 12 (ref 5–15)
BILIRUBIN TOTAL: 0.3 mg/dL (ref 0.3–1.2)
BUN: 12 mg/dL (ref 6–20)
CO2: 23 mmol/L (ref 22–32)
CREATININE: 0.67 mg/dL (ref 0.61–1.24)
Calcium: 9 mg/dL (ref 8.9–10.3)
Chloride: 108 mmol/L (ref 101–111)
GFR calc Af Amer: >60 mL/min (ref >60)
Glucose, Bld: 111 mg/dL — ABNORMAL HIGH (ref 65–99)
POTASSIUM: 3.5 mmol/L (ref 3.5–5.1)
Sodium: 143 mmol/L (ref 135–145)
TOTAL PROTEIN: 7.1 g/dL (ref 6.5–8.1)

## 2015-04-03 LAB — CBC
HEMATOCRIT: 45.6 % (ref 39.0–52.0)
HEMOGLOBIN: 15.5 g/dL (ref 13.0–17.0)
MCH: 32 pg (ref 26.0–34.0)
MCHC: 34 g/dL (ref 30.0–36.0)
MCV: 94 fL (ref 78.0–100.0)
Platelets: 191 10*3/uL (ref 150–400)
RBC: 4.85 MIL/uL (ref 4.22–5.81)
RDW: 13.7 % (ref 11.5–15.5)
WBC: 10.1 10*3/uL (ref 4.0–10.5)

## 2015-04-03 LAB — I-STAT CHEM 8, ED
BUN: 11 mg/dL (ref 6–20)
CALCIUM ION: 1.09 mmol/L — AB (ref 1.12–1.23)
Chloride: 106 mmol/L (ref 101–111)
Creatinine, Ser: 1 mg/dL (ref 0.61–1.24)
GLUCOSE: 108 mg/dL — AB (ref 65–99)
HCT: 49 % (ref 39.0–52.0)
HEMOGLOBIN: 16.7 g/dL (ref 13.0–17.0)
Potassium: 3.5 mmol/L (ref 3.5–5.1)
Sodium: 143 mmol/L (ref 135–145)
TCO2: 24 mmol/L (ref 0–100)

## 2015-04-03 LAB — CBG MONITORING, ED: GLUCOSE-CAPILLARY: 100 mg/dL — AB (ref 65–99)

## 2015-04-03 LAB — PROTIME-INR
INR: 1.02 (ref 0.00–1.49)
Prothrombin Time: 13.2 seconds (ref 11.6–15.2)

## 2015-04-03 LAB — APTT: APTT: 28 s (ref 24–37)

## 2015-04-03 MED ORDER — IOHEXOL 350 MG/ML SOLN
100.0000 mL | Freq: Once | INTRAVENOUS | Status: AC | PRN
  Administered 2015-04-03: 100 mL via INTRAVENOUS

## 2015-04-03 MED ORDER — ASPIRIN 81 MG PO CHEW
324.0000 mg | CHEWABLE_TABLET | Freq: Once | ORAL | Status: AC
  Administered 2015-04-03: 324 mg via ORAL
  Filled 2015-04-03: qty 4

## 2015-04-03 NOTE — ED Provider Notes (Signed)
CSN: [id number]     Arrival date & time 04/02/15  2305 History   None    Chief Complaint  Patient presents with  . Eye Problem     (Consider location/radiation/quality/duration/timing/severity/associated sxs/prior Treatment) Patient is a 55 y.o. male presenting with neurologic complaint. The history is provided by the patient.  Neurologic Problem This is a new problem. The current episode started today. The problem has been resolved. Associated symptoms include a visual change and weakness. Nothing aggravates the symptoms. He has tried nothing for the symptoms. The treatment provided no relief.  Pt reports at 10 am on 3/10 he felt weak, was unable to drive.  Pt reports difficulty with depth perception, weakness.  Pt reports trouble getting right eyelid to stay up.   Pt reports symptoms have resolved now. Pt has a history of htn, diabetes, hypercholesteremia,   Past Medical History  Diagnosis Date  . Hypertension   . Hypercholesteremia   . Hypercholesteremia    Past Surgical History  Procedure Laterality Date  . Knee arthroplasty Left    History reviewed. No pertinent family history. Social History  Substance Use Topics  . Smoking status: Current Every Day Smoker -- 1.50 packs/day    Types: Cigarettes  . Smokeless tobacco: Never Used  . Alcohol Use: 0.5 oz/week    1 Standard drinks or equivalent per week    Review of Systems  Neurological: Positive for weakness.  All other systems reviewed and are negative.     Allergies  Bee venom and Spider antivenin  Home Medications   Prior to Admission medications   Medication Sig Start Date End Date Taking? Authorizing Provider  atorvastatin (LIPITOR) 40 MG tablet 1 TABLET ONCE A DAY ORALLY 30 DAY(S) 03/04/15  Yes Historical Provider, MD  diphenhydrAMINE (BENADRYL) 25 MG tablet Take 25 mg by mouth every 4 (four) hours as needed for itching.   Yes Historical Provider, MD  lisinopril (PRINIVIL,ZESTRIL) 20 MG tablet Take 20 mg by  mouth daily.   Yes Historical Provider, MD  Omega-3 Fatty Acids (FISH OIL PO) Take 2 capsules by mouth 2 (two) times daily.    Yes Historical Provider, MD  predniSONE (DELTASONE) 5 MG tablet Take 5 mg by mouth daily. 03/22/15  Yes Historical Provider, MD  ranitidine (ZANTAC) 150 MG tablet Take 150 mg by mouth 2 (two) times daily.   Yes Historical Provider, MD  EPINEPHrine (EPIPEN) 0.3 mg/0.3 mL SOAJ injection Inject 0.3 mg into the muscle once.    Historical Provider, MD   BP 130/85 mmHg  Pulse 68  Temp(Src) 98.3 F (36.8 C) (Oral)  Resp 15  Ht 5\' 10"  (1.778 m)  Wt 97.523 kg  BMI 30.85 kg/m2  SpO2 98% Physical Exam  Constitutional: He is oriented to person, place, and time. He appears well-developed and well-nourished.  HENT:  Head: Normocephalic.  Right Ear: External ear normal.  Left Ear: External ear normal.  Nose: Nose normal.  Mouth/Throat: Oropharynx is clear and moist.  Eyes: Conjunctivae and EOM are normal. Pupils are equal, round, and reactive to light.  Neck: Normal range of motion.  Cardiovascular: Normal rate and normal heart sounds.   Pulmonary/Chest: Effort normal and breath sounds normal.  Abdominal: Soft. He exhibits no distension.  Musculoskeletal: Normal range of motion.  Neurological: He is alert and oriented to person, place, and time.  Skin: Skin is warm.  Psychiatric: He has a normal mood and affect.  Nursing note and vitals reviewed.   ED Course  Procedures (including  critical care time) Labs Review Labs Reviewed  DIFFERENTIAL - Abnormal; Notable for the following:    Lymphs Abs 5.5 (*)    All other components within normal limits  COMPREHENSIVE METABOLIC PANEL - Abnormal; Notable for the following:    Glucose, Bld 111 (*)    AST 48 (*)    All other components within normal limits  CBG MONITORING, ED - Abnormal; Notable for the following:    Glucose-Capillary 100 (*)    All other components within normal limits  I-STAT CHEM 8, ED - Abnormal;  Notable for the following:    Glucose, Bld 108 (*)    Calcium, Ion 1.09 (*)    All other components within normal limits  PROTIME-INR  APTT  CBC    Imaging Review Ct Angio Head W/cm &/or Wo Cm  04/03/2015  CLINICAL DATA:  Initial evaluation for acute confusion. EXAM: CT ANGIOGRAPHY HEAD AND NECK TECHNIQUE: Multidetector CT imaging of the head and neck was performed using the standard protocol during bolus administration of intravenous contrast. Multiplanar CT image reconstructions and MIPs were obtained to evaluate the vascular anatomy. Carotid stenosis measurements (when applicable) are obtained utilizing NASCET criteria, using the distal internal carotid diameter as the denominator. CONTRAST:  134mL OMNIPAQUE IOHEXOL 350 MG/ML SOLN COMPARISON:  Prior CT from earlier the same day. FINDINGS: FINDINGS CTA NECK Aortic arch: Visualized aortic arch of normal caliber with normal branch pattern. No high-grade stenosis seen at the origin of the great vessels. Minimal plaque at the origin of the left subclavian artery noted. Visualized subclavian arteries well opacified distally. Right carotid system: Right common carotid artery patent from its origin to the bifurcation. Scattered atheromatous plaque about the right carotid bifurcation/proximal right ICA. Stenosis of approximately 40-50% by NASCET criteria. Right ICA widely patent distally to the skullbase. No evidence for dissection, high-grade flow-limiting stenosis, or vascular occlusion within the right rib carotid are system. Atheromatous plaque at the origin of the right external carotid artery as well. Right external carotid artery and its branches otherwise normal in appearance. Left carotid system: Left common carotid artery widely patent from its origin to the bifurcation. Scattered calcified atheromatous plaque about the left carotid bifurcation without significant narrowing. Left ICA well opacified distally to the skullbase. No dissection,  flow-limiting stenosis, or vascular occlusion within the left carotid artery system. Vertebral arteries:Both vertebral arteries arise from the subclavian arteries. Focal plaque at the origin of the right vertebral artery with a least moderate stenosis. Vertebral arteries are otherwise well opacified along their entire course without evidence for dissection, stenosis, or occlusion. Vertebral arteries are somewhat diffusely diminutive bilaterally. Skeleton: No acute osseous abnormality. No worrisome lytic or blastic osseous lesions. Straightening with slight reversal the normal cervical lordosis. Mild degenerative spondylolysis present at C5-6 and C6-7. Other neck: Visualized lungs are grossly clear. Paraseptal and centrilobular emphysema noted. Visualized superior mediastinum within normal limits. Thyroid normal. No adenopathy within the neck. No acute soft tissue abnormality. CTA HEAD Anterior circulation: Petrous segments are widely patent bilaterally. Scattered calcified atheromatous plaque within the cavernous ICAs with secondary mild to moderate multi focal narrowing A1 segments patent. Anterior communicating artery normal. Anterior cerebral arteries well opacified to their distal aspects. M1 segments patent without stenosis or occlusion. MCA bifurcations normal. Distal MCA branches well opacified and symmetric. Posterior circulation: Vertebral arteries are somewhat diminutive but widely patent to the vertebrobasilar junction. Posterior inferior cerebral arteries patent. Basilar artery diminutive but patent to its distal aspect. Superior cerebellar arteries patent bilaterally. P1  segments markedly hypoplastic, particularly on the left. Prominent bilateral posterior communicating arteries. PCAs well opacified to their distal aspect without stenosis or proximal occlusion. Venous sinuses: Patent without evidence for venous sinus thrombosis. Anatomic variants: Diminutive vertebrobasilar system with PCA supplied  mainly via posterior communicating arteries. No aneurysm. Delayed phase: No pathologic enhancement. IMPRESSION: CTA NECK IMPRESSION: 1. No acute abnormality within the major arterial vasculature of the neck. 2. Atheromatous plaque about the proximal right ICA with short-segment stenosis of approximately 40-50% by NASCET criteria. 3. Mild atheromatous plaque about the left carotid bifurcation without significant stenosis. 4. Patent vertebral arteries within the neck. CTA HEAD IMPRESSION: 1. No large or proximal arterial branch occlusion within the intracranial circulation. No high-grade or correctable stenosis. 2. Atheromatous plaque within the cavernous ICAs bilaterally with secondary mild to moderate multi focal narrowing. Electronically Signed   By: Jeannine Boga M.D.   On: 04/03/2015 05:23   Ct Head Wo Contrast  04/03/2015  CLINICAL DATA:  TIA symptoms at 10 a.m. yesterday. Patient reports "He did not feel right" EXAM: CT HEAD WITHOUT CONTRAST TECHNIQUE: Contiguous axial images were obtained from the base of the skull through the vertex without intravenous contrast. COMPARISON:  06/10/2009 FINDINGS: No intracranial hemorrhage, mass effect, or midline shift. No hydrocephalus. The basilar cisterns are patent. No evidence of territorial infarct. No intracranial fluid collection. Calvarium is intact. Included paranasal sinuses and mastoid air cells are well aerated. IMPRESSION: No acute intracranial abnormality. Electronically Signed   By: Jeb Levering M.D.   On: 04/03/2015 03:17   Ct Angio Neck W/cm &/or Wo/cm  04/03/2015  CLINICAL DATA:  Initial evaluation for acute confusion. EXAM: CT ANGIOGRAPHY HEAD AND NECK TECHNIQUE: Multidetector CT imaging of the head and neck was performed using the standard protocol during bolus administration of intravenous contrast. Multiplanar CT image reconstructions and MIPs were obtained to evaluate the vascular anatomy. Carotid stenosis measurements (when  applicable) are obtained utilizing NASCET criteria, using the distal internal carotid diameter as the denominator. CONTRAST:  111mL OMNIPAQUE IOHEXOL 350 MG/ML SOLN COMPARISON:  Prior CT from earlier the same day. FINDINGS: FINDINGS CTA NECK Aortic arch: Visualized aortic arch of normal caliber with normal branch pattern. No high-grade stenosis seen at the origin of the great vessels. Minimal plaque at the origin of the left subclavian artery noted. Visualized subclavian arteries well opacified distally. Right carotid system: Right common carotid artery patent from its origin to the bifurcation. Scattered atheromatous plaque about the right carotid bifurcation/proximal right ICA. Stenosis of approximately 40-50% by NASCET criteria. Right ICA widely patent distally to the skullbase. No evidence for dissection, high-grade flow-limiting stenosis, or vascular occlusion within the right rib carotid are system. Atheromatous plaque at the origin of the right external carotid artery as well. Right external carotid artery and its branches otherwise normal in appearance. Left carotid system: Left common carotid artery widely patent from its origin to the bifurcation. Scattered calcified atheromatous plaque about the left carotid bifurcation without significant narrowing. Left ICA well opacified distally to the skullbase. No dissection, flow-limiting stenosis, or vascular occlusion within the left carotid artery system. Vertebral arteries:Both vertebral arteries arise from the subclavian arteries. Focal plaque at the origin of the right vertebral artery with a least moderate stenosis. Vertebral arteries are otherwise well opacified along their entire course without evidence for dissection, stenosis, or occlusion. Vertebral arteries are somewhat diffusely diminutive bilaterally. Skeleton: No acute osseous abnormality. No worrisome lytic or blastic osseous lesions. Straightening with slight reversal the normal cervical lordosis.  Mild degenerative spondylolysis present at C5-6 and C6-7. Other neck: Visualized lungs are grossly clear. Paraseptal and centrilobular emphysema noted. Visualized superior mediastinum within normal limits. Thyroid normal. No adenopathy within the neck. No acute soft tissue abnormality. CTA HEAD Anterior circulation: Petrous segments are widely patent bilaterally. Scattered calcified atheromatous plaque within the cavernous ICAs with secondary mild to moderate multi focal narrowing A1 segments patent. Anterior communicating artery normal. Anterior cerebral arteries well opacified to their distal aspects. M1 segments patent without stenosis or occlusion. MCA bifurcations normal. Distal MCA branches well opacified and symmetric. Posterior circulation: Vertebral arteries are somewhat diminutive but widely patent to the vertebrobasilar junction. Posterior inferior cerebral arteries patent. Basilar artery diminutive but patent to its distal aspect. Superior cerebellar arteries patent bilaterally. P1 segments markedly hypoplastic, particularly on the left. Prominent bilateral posterior communicating arteries. PCAs well opacified to their distal aspect without stenosis or proximal occlusion. Venous sinuses: Patent without evidence for venous sinus thrombosis. Anatomic variants: Diminutive vertebrobasilar system with PCA supplied mainly via posterior communicating arteries. No aneurysm. Delayed phase: No pathologic enhancement. IMPRESSION: CTA NECK IMPRESSION: 1. No acute abnormality within the major arterial vasculature of the neck. 2. Atheromatous plaque about the proximal right ICA with short-segment stenosis of approximately 40-50% by NASCET criteria. 3. Mild atheromatous plaque about the left carotid bifurcation without significant stenosis. 4. Patent vertebral arteries within the neck. CTA HEAD IMPRESSION: 1. No large or proximal arterial branch occlusion within the intracranial circulation. No high-grade or  correctable stenosis. 2. Atheromatous plaque within the cavernous ICAs bilaterally with secondary mild to moderate multi focal narrowing. Electronically Signed   By: Jeannine Boga M.D.   On: 04/03/2015 05:23   I have personally reviewed and evaluated these images and lab results as part of my medical decision-making.   EKG Interpretation   Date/Time:  Saturday April 03 2015 02:10:58 EST Ventricular Rate:  60 PR Interval:  189 QRS Duration: 93 QT Interval:  428 QTC Calculation: 428 R Axis:   65 Text Interpretation:  Sinus rhythm Consider left ventricular hypertrophy  Confirmed by [facility]  MD, Emmaline Kluver ([id number]) on 04/03/2015 2:56:40 AM      MDM I spoke to Dr. Leonel Ramsay Neurology  who advised ct angio head and neck, if normal aspirin therapy.  I discussed results with Dr. Leonel Ramsay.   Pt counseled on results.   Final diagnoses:  Transient cerebral ischemia, unspecified transient cerebral ischemia type  Bilateral carotid artery disease ([place])    Meds ordered this encounter  Medications  . predniSONE (DELTASONE) 5 MG tablet    Sig: Take 5 mg by mouth daily.    Refill:  1  . atorvastatin (LIPITOR) 40 MG tablet    Sig: 1 TABLET ONCE A DAY ORALLY 30 DAY(S)    Refill:  4  . ranitidine (ZANTAC) 150 MG tablet    Sig: Take 150 mg by mouth 2 (two) times daily.  Marland Kitchen aspirin chewable tablet 324 mg    Sig:   . iohexol (OMNIPAQUE) 350 MG/ML injection 100 mL    Sig:     An After Visit Summary was printed and given to the patient.  Hollace Kinnier [place], PA-C 04/03/15 [id number]  April Palumbo, MD 04/03/15 [phone]

## 2015-04-03 NOTE — Discharge Instructions (Signed)
°Carotid Artery Disease °The carotid arteries are the two main arteries on either side of the neck that supply blood to the brain. Carotid artery disease, also called carotid artery stenosis, is the narrowing or blockage of one or both carotid arteries. Carotid artery disease increases your risk for a stroke or a transient ischemic attack (TIA). A TIA is an episode in which a waxy, fatty substance that accumulates within the artery (plaque) blocks blood flow to the brain. A TIA is considered a "warning stroke."  °CAUSES  °· Buildup of plaque inside the carotid arteries (atherosclerosis) (common). °· A weakened outpouching in an artery (aneurysm). °· Inflammation of the carotid artery (arteritis). °· A fibrous growth within the carotid artery (fibromuscular dysplasia). °· Tissue death within the carotid artery due to radiation treatment (post-radiation necrosis). °· Decreased blood flow due to spasms of the carotid artery (vasospasm). °· Separation of the walls of the carotid artery (carotid dissection). °RISK FACTORS °· High cholesterol (dyslipidemia).   °· High blood pressure (hypertension).   °· Smoking.   °· Obesity.   °· Diabetes.   °· Family history of cardiovascular disease.   °· Inactivity or lack of regular exercise.   °· Being male. Men have an increased risk of developing atherosclerosis earlier in life than women.   °SYMPTOMS  °Carotid artery disease does not cause symptoms. °DIAGNOSIS °Diagnosis of carotid artery disease may include:  °· A physical exam. Your health care provider may hear an abnormal sound (bruit) when listening to the carotid arteries.   °· Specific tests that look at the blood flow in the carotid arteries. These tests include:   °¨ Carotid artery ultrasonography.   °¨ Carotid or cerebral angiography.   °¨ Computerized tomographic angiography (CTA).   °¨ Magnetic resonance angiography (MRA).   °TREATMENT  °Treatment of carotid artery disease can include a combination of treatments.  Treatment options include: °· Surgery. You may have:   °¨ A carotid endarterectomy. This is a surgery to remove the blockages in the carotid arteries.   °¨ A carotid angioplasty with stenting. This is a nonsurgical interventional procedure. A wire mesh (stent) is used to widen the blocked carotid arteries.   °· Medicines to control blood pressure, cholesterol, and reduce blood clotting (antiplatelet therapy).   °· Adjusting your diet.   °· Lifestyle changes such as:   °¨ Quitting smoking.   °¨ Exercising as tolerated or as directed by your health care provider.   °¨ Controlling and maintaining a good blood pressure.   °¨ Keeping cholesterol levels under control.   °HOME CARE INSTRUCTIONS  °· Take medicines only as directed by your health care provider. Make sure you understand all your medicine instructions. Do not stop your medicines without talking to your health care provider.   °· Follow your health care provider's diet instructions. It is important to eat a healthy diet that is low in saturated fats and includes plenty of fresh fruits, vegetables, and lean meats. High-fat, high-sodium foods as well as foods that are fried, overly processed, or have poor nutritional value should be avoided. °· Maintain a healthy weight.   °· Stay physically active. It is recommended that you get at least 30 minutes of activity every day.   °· Do not use any tobacco products including cigarettes, chewing tobacco, or electronic cigarettes. If you need help quitting, ask your health care provider. °· Limit alcohol use to:   °¨ No more than 2 drinks per day for men.   °¨ No more than 1 drink per day for nonpregnant women.   °· Do not use illegal drugs.   °· Keep all follow-up visits as directed by your health   care provider.   °SEEK IMMEDIATE MEDICAL CARE IF:  °You develop TIA or stroke symptoms. These include:  °· Sudden weakness or numbness on one side of the body, such as in the face, arm, or leg.   °· Sudden confusion.    °· Trouble speaking (aphasia) or understanding.   °· Sudden trouble seeing out of one or both eyes.   °· Sudden trouble walking.   °· Dizziness or feeling like you might faint.   °· Loss of balance or coordination.   °· Sudden severe headache with no known cause.   °· Sudden trouble swallowing (dysphagia).   °If you have any of these symptoms, call your local emergency services (911 in [place] Do not drive yourself to the clinic or hospital. This is a medical emergency.  °  °This information is not intended to replace advice given to you by your health care provider. Make sure you discuss any questions you have with your health care provider. °  °Document Released: 04/03/2011 Document Revised: 01/30/2014 Document Reviewed: 07/10/2012 °Elsevier Interactive Patient Education ©2016 [employer] ° °

## 2016-01-20 ENCOUNTER — Other Ambulatory Visit: Payer: Self-pay | Admitting: Physician Assistant

## 2016-01-20 DIAGNOSIS — R945 Abnormal results of liver function studies: Principal | ICD-10-CM

## 2016-01-20 DIAGNOSIS — R7989 Other specified abnormal findings of blood chemistry: Secondary | ICD-10-CM

## 2016-02-02 ENCOUNTER — Ambulatory Visit
Admission: RE | Admit: 2016-02-02 | Discharge: 2016-02-02 | Disposition: A | Discharge status: Home / Self Care | Payer: PRIVATE HEALTH INSURANCE | Source: Ambulatory Visit | Attending: Physician Assistant | Admitting: Physician Assistant

## 2016-02-02 DIAGNOSIS — R7989 Other specified abnormal findings of blood chemistry: Secondary | ICD-10-CM

## 2016-02-02 DIAGNOSIS — R945 Abnormal results of liver function studies: Principal | ICD-10-CM

## 2016-11-19 ENCOUNTER — Encounter (HOSPITAL_COMMUNITY): Payer: Self-pay | Admitting: *Deleted

## 2016-11-19 ENCOUNTER — Emergency Department (HOSPITAL_COMMUNITY)
Admission: EM | Admit: 2016-11-19 | Discharge: 2016-11-19 | Disposition: A | Discharge status: Home / Self Care | Payer: PRIVATE HEALTH INSURANCE | Attending: Emergency Medicine | Admitting: Emergency Medicine

## 2016-11-19 DIAGNOSIS — Z96652 Presence of left artificial knee joint: Secondary | ICD-10-CM | POA: Insufficient documentation

## 2016-11-19 DIAGNOSIS — F17210 Nicotine dependence, cigarettes, uncomplicated: Secondary | ICD-10-CM | POA: Insufficient documentation

## 2016-11-19 DIAGNOSIS — Z79899 Other long term (current) drug therapy: Secondary | ICD-10-CM | POA: Insufficient documentation

## 2016-11-19 DIAGNOSIS — T782XXA Anaphylactic shock, unspecified, initial encounter: Secondary | ICD-10-CM | POA: Insufficient documentation

## 2016-11-19 DIAGNOSIS — I10 Essential (primary) hypertension: Secondary | ICD-10-CM | POA: Insufficient documentation

## 2016-11-19 HISTORY — DX: Unspecified osteoarthritis, unspecified site: M19.90

## 2016-11-19 MED ORDER — SODIUM CHLORIDE 0.9 % IV BOLUS (SEPSIS)
1000.0000 mL | Freq: Once | INTRAVENOUS | Status: AC
  Administered 2016-11-19: 1000 mL via INTRAVENOUS

## 2016-11-19 MED ORDER — PREDNISONE 10 MG (21) PO TBPK: ORAL_TABLET | ORAL | 0 refills | Status: DC

## 2016-11-19 MED ORDER — METHYLPREDNISOLONE SODIUM SUCC 125 MG IJ SOLR
125.0000 mg | Freq: Once | INTRAMUSCULAR | Status: AC
  Administered 2016-11-19: 125 mg via INTRAVENOUS
  Filled 2016-11-19: qty 2

## 2016-11-19 MED ORDER — SODIUM CHLORIDE 0.9 % IV SOLN
INTRAVENOUS | Status: DC
  Administered 2016-11-19: 12:00:00 via INTRAVENOUS

## 2016-11-19 MED ORDER — HYDROXYZINE HCL 25 MG PO TABS: 25.0000 mg | ORAL_TABLET | Freq: Four times a day (QID) | ORAL | 0 refills | Status: DC

## 2016-11-19 MED ORDER — FAMOTIDINE 20 MG PO TABS
20.0000 mg | ORAL_TABLET | Freq: Once | ORAL | Status: AC
  Administered 2016-11-19: 20 mg via ORAL
  Filled 2016-11-19: qty 1

## 2016-11-19 MED ORDER — HYDROXYZINE HCL 25 MG PO TABS
25.0000 mg | ORAL_TABLET | Freq: Once | ORAL | Status: AC
  Administered 2016-11-19: 25 mg via ORAL
  Filled 2016-11-19: qty 1

## 2016-11-19 MED ORDER — EPINEPHRINE 0.3 MG/0.3ML IJ SOAJ: INTRAMUSCULAR | 0 refills | Status: AC

## 2016-11-19 NOTE — ED Notes (Signed)
Patient is resting comfortably. 

## 2016-11-19 NOTE — ED Triage Notes (Signed)
Pt arrived via EMS, from home due to allergic reaction possible spider bite. He is not quite sure. He woke up with hives on stomach, arms and legs. He experience nausea en route to hospital. No emesis episode. He was administered Benadryl 75 mg at home and Benadryl 25 mg IV en route. He also had his Epi Pen this morning with another dose en route to ED. Zofran 4 mg  IV. Nausea and hives relieved by time of arrival to ED.

## 2016-11-19 NOTE — ED Notes (Signed)
Bed: KG40 Expected date: 11/19/16 Expected time: 9:06 AM Means of arrival: Ambulance Comments: Allergic reaction ([place])

## 2016-11-19 NOTE — ED Provider Notes (Signed)
[facility] DEPT Provider Note   CSN: [id number] Arrival date & time: 11/19/16  6387     History   Chief Complaint Chief Complaint  Patient presents with  . Allergic Reaction    HPI Robert Greene is a 56 y.o. male.  Pt presents to the ED today with an allergic reaction.  He woke up around 0600 with hives and sob.  He felt like his face was swelling.  He took benadryl at home.  This did not help, so he called EMS.  He gave himself his epi pen (prior hx bee sting allergy) which did not help that much either.  The EMS gave him additional 25 mg IV benadryl enroute and another epi pen.  Pt had some nausea, so he was given zofran en route as well.  Sx have gotten much better.  Pt denies sob or nausea.  He does not feel like there is any swelling in his throat.  Pt has no idea what caused the reaction.  No new food or products.  He did have a new itching insect bite to his right posterior thigh, so he questions if a spider bit him.      Past Medical History:  Diagnosis Date  . Arthritis    rheumatoid  . Hypercholesteremia   . Hypercholesteremia   . Hypertension     There are no active problems to display for this patient.   Past Surgical History:  Procedure Laterality Date  . KNEE ARTHROPLASTY Left        Home Medications    Prior to Admission medications   Medication Sig Start Date End Date Taking? Authorizing Provider  Adalimumab (HUMIRA) 40 MG/0.4ML PSKT Inject 40 mg into the skin every 14 (fourteen) days.    Yes [provider]  atorvastatin (LIPITOR) 80 MG tablet Take 80 mg by mouth daily. 10/18/16  Yes [provider]  diphenhydrAMINE (BENADRYL) 25 MG tablet Take 25 mg by mouth every 4 (four) hours as needed for itching.   Yes [provider]  lisinopril (PRINIVIL,ZESTRIL) 20 MG tablet Take 20 mg by mouth daily.   Yes [provider]  naproxen sodium (ANAPROX) 220 MG tablet Take 220 mg by mouth 2  (two) times daily with a meal.   Yes [provider]  Omega-3 Fatty Acids (FISH OIL PO) Take 2 capsules by mouth 2 (two) times daily.    Yes [provider]  ranitidine (ZANTAC) 150 MG tablet Take 150 mg by mouth 2 (two) times daily.   Yes [provider]  EPINEPHrine 0.3 mg/0.3 mL IJ SOAJ injection Use once.  If ineffective, use 2nd dose. 11/19/16   Isla Pence, MD  hydrOXYzine (ATARAX/VISTARIL) 25 MG tablet Take 1 tablet (25 mg total) by mouth every 6 (six) hours. 11/19/16   Isla Pence, MD  predniSONE (STERAPRED UNI-PAK 21 TAB) 10 MG (21) TBPK tablet Take 6 tabs by mouth daily  for 2 days, then 5 tabs for 2 days, then 4 tabs for 2 days, then 3 tabs for 2 days, 2 tabs for 2 days, then 1 tab by mouth daily for 2 days 11/19/16   Isla Pence, MD    Family History No family history on file.  Social History Social History  Substance Use Topics  . Smoking status: Current Every Day Smoker    Packs/day: 1.50    Types: Cigarettes  . Smokeless tobacco: Never Used  . Alcohol use 0.5 oz/week    1 Standard  drinks or equivalent per week     Allergies   Bee venom and Spider antivenin [black widow spider antivenin (l.mactans)]   Review of Systems Review of Systems  HENT: Positive for facial swelling.   Respiratory: Positive for shortness of breath.   Skin: Positive for rash.  All other systems reviewed and are negative.    Physical Exam Updated Vital Signs BP 140/80   Pulse 76   Temp 97.7 F (36.5 C) (Oral)   Resp (!) 21   SpO2 94%   Physical Exam  Constitutional: He is oriented to person, place, and time. He appears well-developed and well-nourished.  HENT:  Head: Normocephalic and atraumatic.  Right Ear: External ear normal.  Left Ear: External ear normal.  Nose: Nose normal.  Mouth/Throat: Oropharynx is clear and moist.  Voice is hoarse, but pt said this is normal for him.   Eyes: Pupils are equal, round, and reactive to light.  Conjunctivae and EOM are normal.  Neck: Normal range of motion. Neck supple.  Cardiovascular: Normal rate, regular rhythm, normal heart sounds and intact distal pulses.   Pulmonary/Chest: Effort normal and breath sounds normal.  Abdominal: Soft. Bowel sounds are normal.  Musculoskeletal: Normal range of motion.  Neurological: He is alert and oriented to person, place, and time.  Skin: Skin is warm.  Skin still red, but no more hives  Psychiatric: He has a normal mood and affect. His behavior is normal. Judgment and thought content normal.  Nursing note and vitals reviewed.    ED Treatments / Results  Labs (all labs ordered are listed, but only abnormal results are displayed) Labs Reviewed - No data to display  EKG  EKG Interpretation None       Radiology No results found.  Procedures Procedures (including critical care time)  Medications Ordered in ED Medications  sodium chloride 0.9 % bolus 1,000 mL (0 mLs Intravenous Stopped 11/19/16 1302)    And  0.9 %  sodium chloride infusion ( Intravenous New Bag/Given 11/19/16 1206)  methylPREDNISolone sodium succinate (SOLU-MEDROL) 125 mg/2 mL injection 125 mg (125 mg Intravenous Given 11/19/16 1055)  famotidine (PEPCID) tablet 20 mg (20 mg Oral Given 11/19/16 1056)  hydrOXYzine (ATARAX/VISTARIL) tablet 25 mg (25 mg Oral Given 11/19/16 1204)     Initial Impression / Assessment and Plan / ED Course  I have reviewed the triage vital signs and the nursing notes.  Pertinent labs & imaging results that were available during my care of the patient were reviewed by me and considered in my medical decision making (see chart for details).     Pt is doing much better.  He was watched for several hours.  He is stable for d/c.  He knows to return if worse.  Final Clinical Impressions(s) / ED Diagnoses   Final diagnoses:  Anaphylaxis, initial encounter    New Prescriptions New Prescriptions   EPINEPHRINE 0.3 MG/0.3 ML IJ SOAJ  INJECTION    Use once.  If ineffective, use 2nd dose.   HYDROXYZINE (ATARAX/VISTARIL) 25 MG TABLET    Take 1 tablet (25 mg total) by mouth every 6 (six) hours.   PREDNISONE (STERAPRED UNI-PAK 21 TAB) 10 MG (21) TBPK TABLET    Take 6 tabs by mouth daily  for 2 days, then 5 tabs for 2 days, then 4 tabs for 2 days, then 3 tabs for 2 days, 2 tabs for 2 days, then 1 tab by mouth daily for 2 days     Isla Pence, MD  11/19/16 1303  

## 2017-02-09 ENCOUNTER — Other Ambulatory Visit: Payer: Self-pay | Admitting: Family Medicine

## 2017-02-09 ENCOUNTER — Other Ambulatory Visit: Payer: Self-pay | Admitting: Acute Care

## 2017-02-09 DIAGNOSIS — F17210 Nicotine dependence, cigarettes, uncomplicated: Secondary | ICD-10-CM

## 2017-02-09 DIAGNOSIS — Z122 Encounter for screening for malignant neoplasm of respiratory organs: Secondary | ICD-10-CM

## 2017-02-09 DIAGNOSIS — I739 Peripheral vascular disease, unspecified: Principal | ICD-10-CM

## 2017-02-09 DIAGNOSIS — I779 Disorder of arteries and arterioles, unspecified: Secondary | ICD-10-CM

## 2017-02-14 ENCOUNTER — Encounter: Payer: Self-pay | Admitting: Acute Care

## 2017-02-14 ENCOUNTER — Ambulatory Visit (INDEPENDENT_AMBULATORY_CARE_PROVIDER_SITE_OTHER): Payer: PRIVATE HEALTH INSURANCE | Admitting: Acute Care

## 2017-02-14 ENCOUNTER — Ambulatory Visit
Admission: RE | Admit: 2017-02-14 | Discharge: 2017-02-14 | Disposition: A | Discharge status: Home / Self Care | Payer: 59 | Source: Ambulatory Visit | Attending: Family Medicine | Admitting: Family Medicine

## 2017-02-14 ENCOUNTER — Ambulatory Visit (INDEPENDENT_AMBULATORY_CARE_PROVIDER_SITE_OTHER)
Admission: RE | Admit: 2017-02-14 | Discharge: 2017-02-14 | Disposition: A | Discharge status: Home / Self Care | Payer: PRIVATE HEALTH INSURANCE | Source: Ambulatory Visit | Attending: Acute Care | Admitting: Acute Care

## 2017-02-14 DIAGNOSIS — F17210 Nicotine dependence, cigarettes, uncomplicated: Secondary | ICD-10-CM

## 2017-02-14 DIAGNOSIS — I779 Disorder of arteries and arterioles, unspecified: Secondary | ICD-10-CM

## 2017-02-14 DIAGNOSIS — I739 Peripheral vascular disease, unspecified: Principal | ICD-10-CM

## 2017-02-14 DIAGNOSIS — Z122 Encounter for screening for malignant neoplasm of respiratory organs: Secondary | ICD-10-CM

## 2017-02-14 NOTE — Progress Notes (Signed)
Shared Decision Making Visit Lung Cancer Screening Program [phone])   Eligibility:  Age 57 y.o.  Pack Years Smoking History Calculation 50 pack year smoking history (# packs/per year x # years smoked)  Recent History of coughing up blood  no  Unexplained weight loss? no ( >Than 15 pounds within the last 6 months )  Prior History Lung / other cancer no (Diagnosis within the last 5 years already requiring surveillance chest CT Scans).  Smoking Status Current Smoker  Former Smokers: Years since quit: NA  Quit Date: NA  Visit Components:  Discussion included one or more decision making aids. yes  Discussion included risk/benefits of screening. yes  Discussion included potential follow up diagnostic testing for abnormal scans. yes  Discussion included meaning and risk of over diagnosis. yes  Discussion included meaning and risk of False Positives. yes  Discussion included meaning of total radiation exposure. yes  Counseling Included:  Importance of adherence to annual lung cancer LDCT screening. yes  Impact of comorbidities on ability to participate in the program. yes  Ability and willingness to under diagnostic treatment. yes  Smoking Cessation Counseling:  Current Smokers:   Discussed importance of smoking cessation. yes  Information about tobacco cessation classes and interventions provided to patient. yes  Patient provided with "ticket" for LDCT Scan. yes  Symptomatic Patient. no  Counseling  Diagnosis Code: Tobacco Use Z72.0  Asymptomatic Patient yes  Counseling (Intermediate counseling: > three minutes counseling) D5329  Former Smokers:   Discussed the importance of maintaining cigarette abstinence. yes  Diagnosis Code: Personal History of Nicotine Dependence. J24.268  Information about tobacco cessation classes and interventions provided to patient. Yes  Patient provided with "ticket" for LDCT Scan. yes  Written Order for Lung Cancer  Screening with LDCT placed in Epic. Yes (CT Chest Lung Cancer Screening Low Dose W/O CM) [id number] Z12.2-Screening of respiratory organs Z87.891-Personal history of nicotine dependence  I have spent 25 minutes of face to face time with Robert Greene discussing the risks and benefits of lung cancer screening. We viewed a power point together that explained in detail the above noted topics. We paused at intervals to allow for questions to be asked and answered to ensure understanding.We discussed that the single most powerful action that he can take to decrease his risk of developing lung cancer is to quit smoking. We discussed whether or not he is ready to commit to setting a quit date. He is currently not ready to set a quit date.We discussed options for tools to aid in quitting smoking including nicotine replacement therapy, non-nicotine medications, support groups, Quit Smart classes, and behavior modification. We discussed that often times setting smaller, more achievable goals, such as eliminating 1 cigarette a day for a week and then 2 cigarettes a day for a week can be helpful in slowly decreasing the number of cigarettes smoked. This allows for a sense of accomplishment as well as providing a clinical benefit. I gave him the " Be Stronger Than Your Excuses" card with contact information for community resources, classes, free nicotine replacement therapy, and access to mobile apps, text messaging, and on-line smoking cessation help. I have also given him my card and contact information in the event he needs to contact me. We discussed the time and location of the scan, and that either Robert Glassman RN or I will call with the results within 24-48 hours of receiving them. I have offered him  a copy of the power point we viewed  as  a resource in the event they need reinforcement of the concepts we discussed today in the office. The patient verbalized understanding of all of  the above and had no further  questions upon leaving the office. They have my contact information in the event they have any further questions.  I spent 4 minutes counseling on smoking cessation and the health risks of continued tobacco abuse.  I explained to the patient that there has been a high incidence of coronary artery disease noted on these exams. I explained that this is a non-gated exam therefore degree or severity cannot be determined. This patient is on statin therapy. I have asked the patient to follow-up with their PCP regarding any incidental finding of coronary artery disease and management with diet or medication as their PCP  feels is clinically indicated. The patient verbalized understanding of the above and had no further questions upon completion of the visit.      Magdalen Spatz, NP 02/14/2017

## 2017-02-23 ENCOUNTER — Telehealth: Payer: Self-pay | Admitting: Acute Care

## 2017-02-23 DIAGNOSIS — J849 Interstitial pulmonary disease, unspecified: Secondary | ICD-10-CM

## 2017-02-27 ENCOUNTER — Other Ambulatory Visit: Payer: Self-pay | Admitting: Acute Care

## 2017-02-27 DIAGNOSIS — F17210 Nicotine dependence, cigarettes, uncomplicated: Secondary | ICD-10-CM

## 2017-02-27 DIAGNOSIS — Z122 Encounter for screening for malignant neoplasm of respiratory organs: Secondary | ICD-10-CM

## 2017-02-27 NOTE — Telephone Encounter (Signed)
Spoke to pt and given consult appt with Dr [name] 03/27/17 4:30.  Order placed for HRCT supine and prone.  Pt verbalized understanding.

## 2017-03-22 ENCOUNTER — Ambulatory Visit (INDEPENDENT_AMBULATORY_CARE_PROVIDER_SITE_OTHER)
Admission: RE | Admit: 2017-03-22 | Discharge: 2017-03-22 | Disposition: A | Discharge status: Home / Self Care | Payer: 59 | Source: Ambulatory Visit | Attending: Acute Care | Admitting: Acute Care

## 2017-03-22 DIAGNOSIS — J849 Interstitial pulmonary disease, unspecified: Secondary | ICD-10-CM

## 2017-03-25 ENCOUNTER — Telehealth: Payer: Self-pay | Admitting: Internal Medicine

## 2017-03-25 DIAGNOSIS — I2510 Atherosclerotic heart disease of native coronary artery without angina pectoris: Secondary | ICD-10-CM

## 2017-03-25 DIAGNOSIS — I2584 Coronary atherosclerosis due to calcified coronary lesion: Principal | ICD-10-CM

## 2017-03-25 NOTE — Telephone Encounter (Signed)
Robert Greene  Seeing him 03/27/17 for ILD picked up on low dose CT by SArah G in Jan 2019. When he comes   A. Needs ILD questionnaire B. Simple walk test   Dr. Brand Males, M.D., [facility].C.P Pulmonary and Critical Care Medicine Staff Physician, [place] Director - Interstitial Lung Disease  Program  Pulmonary [place] at [place], [place], [address]  Pager: [phone], If no answer or between  15:00h - 7:00h: call [phone] Telephone: [phone]

## 2017-03-26 NOTE — Telephone Encounter (Signed)
Noted. Already had that written down on my schedule tomorrow of pt appts.  Thanks!

## 2017-03-27 ENCOUNTER — Ambulatory Visit (INDEPENDENT_AMBULATORY_CARE_PROVIDER_SITE_OTHER): Payer: 59 | Admitting: Internal Medicine

## 2017-03-27 ENCOUNTER — Encounter: Payer: Self-pay | Admitting: Internal Medicine

## 2017-03-27 VITALS — BP 136/82 | HR 65 | Ht 70.0 in | Wt 218.8 lb

## 2017-03-27 DIAGNOSIS — F17210 Nicotine dependence, cigarettes, uncomplicated: Secondary | ICD-10-CM | POA: Diagnosis not present

## 2017-03-27 DIAGNOSIS — J849 Interstitial pulmonary disease, unspecified: Secondary | ICD-10-CM | POA: Diagnosis not present

## 2017-03-27 DIAGNOSIS — Z77120 Contact with and (suspected) exposure to mold (toxic): Secondary | ICD-10-CM

## 2017-03-27 DIAGNOSIS — M069 Rheumatoid arthritis, unspecified: Secondary | ICD-10-CM

## 2017-03-27 DIAGNOSIS — Z77098 Contact with and (suspected) exposure to other hazardous, chiefly nonmedicinal, chemicals: Secondary | ICD-10-CM

## 2017-03-27 DIAGNOSIS — Z7729 Contact with and (suspected ) exposure to other hazardous substances: Secondary | ICD-10-CM | POA: Diagnosis not present

## 2017-03-27 NOTE — Progress Notes (Signed)
Subjective:    Patient ID: Robert Greene, male    DOB: [birth year], 57 y.o.   MRN: [id number]  PCP [name], MD   HPI    OV 03/27/2017 - ILD CLINIC  Chief Complaint  Patient presents with  . Consult    Consult per Eric Form, NP for ILD seen on HRCT 03/22/17.  Pt states he has occ. SOB, and sinus problems. Denies any cough or CP.   57 year old male referred for interstitial lung disease.  He is known to have rheumatoid arthritis for a few years and is on Humira.  He is also a heavy smoker.  He underwent screening low-dose CT scan of the chest and of January 2019 and this showed interstitial lung disease.  This was followed up by high-resolution CT chest which has confirmed interstitial lung disease.  According to thoracic radiology it is indeterminate for UIP.  There is also associated emphysema.  Therefore has been referred to interstitial lung disease clinic  [employer] College of chest physicians interstitial lung disease questionnaire  : He has symptoms of cough occasionally but not bothersome for several years.  It is cough at night and it is a dry cough without any sputum.  He has mild shortness of breath when hurrying on level ground or walking up a slight hill.  This is been going on for 2 years without any change.  In terms of his past medical history he has some arthralgia which is due to rheumatoid arthritis and is on Humira.  He has acid reflux disease.  Otherwise past medical history is negative  In terms of personal exposure history: He is a heavy smoker started smoking at age 41.  Smokes 20 cigarettes a day.  He is trying to quit.  He does not want the help of Chantix or Zyban.  He is trying to quit on his own by reducing the number of cigarettes he smokes each day.  He does not use any street drugs.  In terms of family history of lung disease: Positive for COPD.  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  He thinks his father-in-law got this from paint exposure.  There is no pulmonary fibrosis and the blood family  Home exposure history: Negative for humidifier [place] or hot tub or Jacuzzi O.  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  So he gets both bleach exposure and mold exposure.  He gets exposed at least 4 homes each year for the last 35 years.  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  There were around 40 pigeons and a small 100 square feet room.  He would visit these patients once a week for 2 years.  He would then feed them and pick them up to race.  During this time he got exposed to significant amount of bird feathers and toilet.  Occupational history: As a Curator as above involving bleach, paint fumes and mold  Pulmonary toxicity history: Negative.  Denies any amiodarone or beta really mL cobalt or radiation or chemotherapy or BCG or nitrofurantoin exposure  Walking desaturation test on 03/27/2017 185 feet x 3 laps on ROOM AIR:  did not desaturate.  Rest pulse ox was 100%, final pulse ox was 98%. HR response 63/min at rest to 86/min at peak exertion. Patient FERLANDO LIA  Did not Desaturate < 88% . [name] did not  Desaturated </= 3% points. [name] did not get tachyardic   IMPRESSION: 1. The appearance of the lungs is compatible with interstitial lung disease, with a CT pattern considered indeterminate for UIP (usual interstitial pneumonia). 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 2. Mild diffuse bronchial wall thickening with mild centrilobular and  paraseptal emphysema; imaging findings suggestive of underlying COPD. 3. Aortic atherosclerosis, in addition to left anterior descending coronary artery disease. Please note that although the presence of coronary artery calcium documents the presence of coronary artery disease, the severity of this disease and any potential stenosis cannot be assessed on this non-gated CT examination. Assessment for potential risk factor modification, dietary therapy or pharmacologic therapy may be warranted, if clinically indicated.  Aortic Atherosclerosis (ICD10-I70.0) and Emphysema (ICD10-J43.9).   Electronically Signed   By: Vinnie Langton M.D.   On: 03/22/2017 10:25     has a past medical history of Arthritis, Hypercholesteremia, Hypercholesteremia, and Hypertension.   reports that he has been smoking cigarettes.  He has a 50.00 pack-year smoking history. he has never used smokeless tobacco.  Past Surgical History:  Procedure Laterality Date  . KNEE ARTHROPLASTY Left     Allergies  Allergen Reactions  . Bee Venom Anaphylaxis, Hives and Rash  . Spider Antivenin [Black Widow Spider Antivenin (L.Mactans)] Anaphylaxis, Hives and Rash     There is no immunization history on file for this patient.  No family history on file.   Current Outpatient Medications:  .  Adalimumab (HUMIRA) 40 MG/0.4ML PSKT, Inject 40 mg into the skin every 14 (fourteen) days. , Disp: , Rfl:  .  atorvastatin (LIPITOR) 80 MG tablet, Take 80 mg by mouth daily., Disp: , Rfl: 1 .  gabapentin (NEURONTIN) 100 MG capsule, , Disp: , Rfl:  .  hydroxychloroquine (PLAQUENIL) 200 MG tablet, TAKE 2 TABLETS BY MOUTH ONCE A DAY WITH FOOD OR MILK, Disp: , Rfl: 2 .  lisinopril (PRINIVIL,ZESTRIL) 20 MG tablet, Take 20 mg by mouth daily., Disp: , Rfl:  .  Omega-3 Fatty Acids (FISH OIL PO), Take 2 capsules by mouth 2 (two) times daily. , Disp: , Rfl:  .  ranitidine (ZANTAC) 150 MG tablet, Take 150 mg by mouth 2 (two) times  daily., Disp: , Rfl:  .  diphenhydrAMINE (BENADRYL) 25 MG tablet, Take 25 mg by mouth every 4 (four) hours as needed for itching., Disp: , Rfl:  .  EPINEPHrine 0.3 mg/0.3 mL IJ SOAJ injection, Use once.  If ineffective, use 2nd dose. (Patient not taking: Reported on 03/27/2017), Disp: 2 Device, Rfl: 0 .  hydrOXYzine (ATARAX/VISTARIL) 25 MG tablet, Take 1 tablet (25 mg total) by mouth every 6 (six) hours. (Patient not taking: Reported on 03/27/2017), Disp: 12 tablet, Rfl: 0 .  naproxen sodium (ANAPROX) 220 MG tablet, Take 220 mg by mouth 2 (two) times daily with a meal., Disp: , Rfl:    Review of Systems  Constitutional: Negative for fever and unexpected weight change.  HENT: Negative for congestion, dental problem, ear pain, nosebleeds, postnasal drip, rhinorrhea, sinus pressure, sneezing, sore throat and trouble swallowing.  Eyes: Negative for redness and itching.  Respiratory: Positive for shortness of breath. Negative for cough, chest tightness and wheezing.   Cardiovascular: Negative for palpitations and leg swelling.  Gastrointestinal: Negative for nausea and vomiting.  Genitourinary: Negative for dysuria.  Musculoskeletal: Positive for joint swelling.  Skin: Negative for rash.  Allergic/Immunologic: Negative.  Negative for environmental allergies, food allergies and immunocompromised state.  Neurological: Negative for headaches.  Hematological: Does not bruise/bleed easily.  Psychiatric/Behavioral: Negative for dysphoric mood. The patient is not nervous/anxious.        Objective:   Physical Exam  Constitutional: He is oriented to person, place, and time. He appears well-developed and well-nourished. No distress.  HENT:  Head: Normocephalic and atraumatic.  Right Ear: External ear normal.  Left Ear: External ear normal.  Mouth/Throat: Oropharynx is clear and moist. No oropharyngeal exudate.  Eyes: Conjunctivae and EOM are normal. Pupils are equal, round, and reactive to light. Right  eye exhibits no discharge. Left eye exhibits no discharge. No scleral icterus.  Neck: Normal range of motion. Neck supple. No JVD present. No tracheal deviation present. No thyromegaly present.  Cardiovascular: Normal rate, regular rhythm and intact distal pulses. Exam reveals no gallop and no friction rub.  No murmur heard. Pulmonary/Chest: Effort normal and breath sounds normal. No respiratory distress. He has no wheezes. He has no rales. He exhibits no tenderness.  Scattered crackles  Abdominal: Soft. Bowel sounds are normal. He exhibits no distension and no mass. There is no tenderness. There is no rebound and no guarding.  Visible obesity  Musculoskeletal: Normal range of motion. He exhibits no edema or tenderness.  Warm hand joints  Lymphadenopathy:    He has no cervical adenopathy.  Neurological: He is alert and oriented to person, place, and time. He has normal reflexes. No cranial nerve deficit. Coordination normal.  Skin: Skin is warm and dry. No rash noted. He is not diaphoretic. No erythema. No pallor.  Psychiatric: He has a normal mood and affect. His behavior is normal. Judgment and thought content normal.  Nursing note and vitals reviewed.   Vitals:   03/27/17 1618  BP: 136/82  Pulse: 65  SpO2: 97%  Weight: 218 lb 12.8 oz (99.2 kg)  Height: 5\' 10"  (1.778 m)         Assessment & Plan:     ICD-10-CM   1. Interstitial pulmonary disease (HCC) J84.9 CT Chest High Resolution    Pulmonary function test  2. Cigarette smoker F17.210   3. Mold exposure Z77.120   4. Long-term exposure involving bird droppings Z77.29   5. Exposure to paint fumes Z77.098   6. Rheumatoid arthritis, involving unspecified site, unspecified rheumatoid factor presence ([place]) M06.9     He has multiple different possibilities for interstitial lung disease and this includes smoking-related ILD, undifferentiated ILD because of fumes exposure, hypersensitivity pneumonitis both due to mold and bird  exposure and rheumatoid arthritis related ILD.  He could still have IPF although the probability for this is less than 50% on basis of indeterminate CT scan but he is a male with a current history of smoking.  Based on all this and the fact that interstitial lung disease can be unpredictable and can get worse at any time I recommended surgical lung biopsy.  However he is worried about the risks of surgical lung biopsy.  I told him he is at low risk.  Therefore we created an alternative plan which is that he is guaranteed to me that he will  quit smoking within the next 4-6 weeks.  He will then do a CT scan of the chest 3 months after that and of the interstitial lung disease is still persistent he will subject himself to surgical lung biopsy.  There is some risk of interstitial lung disease getting worse during this time it is less than 5-10%.  He wants to accept this risk and try the quit smoking approach first.  Meanwhile I will try to query his autoimmune records from Dr. Amil Amen his rheumatologist.     > 50% of this > 40 min visit spent in face to face counseling or/and coordination of care     Dr. Brand Males, M.D., [facility].C.P Pulmonary and Critical Care Medicine Staff Physician, [place] Director - Interstitial Lung Disease  Program  Pulmonary [place] at [place], [place], [address]  Pager: [phone], If no answer or between  15:00h - 7:00h: call [phone] Telephone: [phone]

## 2017-03-27 NOTE — Patient Instructions (Signed)
ICD-10-CM   1. Interstitial pulmonary disease ([place]) J84.9   2. Cigarette smoker F17.210   3. Mold exposure Z77.120   4. Long-term exposure involving bird droppings Z77.29   5. Exposure to paint fumes Z77.098   6. Rheumatoid arthritis, involving unspecified site, unspecified rheumatoid factor presence ([place]) M06.9     You have Pulmonary fibrosis which could be due to #2 to #6  PLAN  - you need surgical lung biopsy to figure out the cause - I think you are low risk for biopsy realated complication - I am willing to hold off on biopsy for now provided you quit smoking in next 1 month and we can reassess in 3 months aftert that if findings are resolved - So, quit smoking next month - Do HRCT in Aug 2019 - IN Aug 2019: Pre-bd spiro and dlco only. No lung volume or bd response. No post-bd spiro - Sign release to get Dr Amil Amen notes  Followup  August 2019 with HRCT and PFT test; return earlier if worse

## 2017-03-27 NOTE — Telephone Encounter (Signed)
Let patient know that  does have coronary artery calcification and if no normal cardiac stress test past few years; please refer to cardiologist - [facility] or Dr Einar Gip, first available  I forgto to tell him this

## 2017-03-28 ENCOUNTER — Telehealth: Payer: Self-pay | Admitting: Internal Medicine

## 2017-03-28 NOTE — Telephone Encounter (Signed)
MR please advise patient is currently seeing cardiologist

## 2017-03-28 NOTE — Telephone Encounter (Signed)
lmtcb x1 for pt. 

## 2017-03-28 NOTE — Telephone Encounter (Signed)
Called and spoke with patient, he is aware of MR results. Awaiting MR response back for a question.

## 2017-03-28 NOTE — Telephone Encounter (Signed)
Any cardiologist - DR Einar Gip if he can be seen sooner or [facility]

## 2017-03-28 NOTE — Telephone Encounter (Signed)
Who is his cardiologist ? And has he ever had a stress test?

## 2017-03-28 NOTE — Telephone Encounter (Signed)
Called and spoke with patient he is now stating that he does not see a cardiologist he saw a radiologist at one point. MR please advise who you would like for me to refer him to. Thanks.

## 2017-03-29 NOTE — Telephone Encounter (Signed)
Called and spoke with patient, advised him that we are sending in a referral for cardiology and he verbalized that was ok. Sending in referral, nothing further needed.

## 2017-04-25 ENCOUNTER — Telehealth: Payer: Self-pay | Admitting: Internal Medicine

## 2017-04-25 NOTE — Telephone Encounter (Signed)
Treadmill with SPECT stress test - 04/16/08 - low risk study at DR [facility] pracicie  ,Dr. Brand Males, M.D., [facility].C.P Pulmonary and Critical Care Medicine Staff Physician, [place] Director - Interstitial Lung Disease  Program  Pulmonary [place] at [place], [place], [address]  Pager: [phone], If no answer or between  15:00h - 7:00h: call [phone] Telephone: [phone]

## 2017-09-06 ENCOUNTER — Ambulatory Visit (INDEPENDENT_AMBULATORY_CARE_PROVIDER_SITE_OTHER)
Admission: RE | Admit: 2017-09-06 | Discharge: 2017-09-06 | Disposition: A | Discharge status: Home / Self Care | Payer: 59 | Source: Ambulatory Visit | Attending: Internal Medicine | Admitting: Internal Medicine

## 2017-09-06 DIAGNOSIS — J849 Interstitial pulmonary disease, unspecified: Secondary | ICD-10-CM | POA: Diagnosis not present

## 2017-09-07 NOTE — Progress Notes (Signed)
ATC, NA and voicemail not set up yet

## 2017-09-26 ENCOUNTER — Ambulatory Visit (INDEPENDENT_AMBULATORY_CARE_PROVIDER_SITE_OTHER): Payer: 59 | Admitting: Internal Medicine

## 2017-09-26 ENCOUNTER — Encounter: Payer: Self-pay | Admitting: Internal Medicine

## 2017-09-26 VITALS — BP 128/80 | HR 67 | Ht 69.25 in | Wt 213.0 lb

## 2017-09-26 DIAGNOSIS — M0600 Rheumatoid arthritis without rheumatoid factor, unspecified site: Secondary | ICD-10-CM

## 2017-09-26 DIAGNOSIS — Z77120 Contact with and (suspected) exposure to mold (toxic): Secondary | ICD-10-CM

## 2017-09-26 DIAGNOSIS — F17210 Nicotine dependence, cigarettes, uncomplicated: Secondary | ICD-10-CM | POA: Diagnosis not present

## 2017-09-26 DIAGNOSIS — Z77098 Contact with and (suspected) exposure to other hazardous, chiefly nonmedicinal, chemicals: Secondary | ICD-10-CM

## 2017-09-26 DIAGNOSIS — Z23 Encounter for immunization: Secondary | ICD-10-CM | POA: Diagnosis not present

## 2017-09-26 DIAGNOSIS — J849 Interstitial pulmonary disease, unspecified: Secondary | ICD-10-CM

## 2017-09-26 LAB — PULMONARY FUNCTION TEST
DL/VA % PRED: 106 %
DL/VA: 4.9 ml/min/mmHg/L
DLCO UNC % PRED: 97 %
DLCO UNC: 30.39 ml/min/mmHg
FEF 25-75 Pre: 1.82 L/sec
FEF2575-%PRED-PRE: 60 %
FEV1-%Pred-Pre: 84 %
FEV1-Pre: 3.03 L
FEV1FVC-%PRED-PRE: 92 %
FEV6-%PRED-PRE: 93 %
FEV6-PRE: 4.21 L
FEV6FVC-%PRED-PRE: 101 %
FVC-%PRED-PRE: 91 %
FVC-PRE: 4.32 L
PRE FEV6/FVC RATIO: 97 %
Pre FEV1/FVC ratio: 70 %

## 2017-09-26 NOTE — Progress Notes (Signed)
PFT done today. 

## 2017-09-26 NOTE — Progress Notes (Signed)
OV 03/27/2017 - ILD CLINIC  Chief Complaint  Patient presents with  . Consult    Consult per Eric Form, NP for ILD seen on HRCT 03/22/17.  Pt states he has occ. SOB, and sinus problems. Denies any cough or CP.   57 year old male referred for interstitial lung disease.  He is known to have rheumatoid arthritis for a few years and is on Humira.  He is also a heavy smoker.  He underwent screening low-dose CT scan of the chest and of January 2019 and this showed interstitial lung disease.  This was followed up by high-resolution CT chest which has confirmed interstitial lung disease.  According to thoracic radiology it is indeterminate for UIP.  There is also associated emphysema.  Therefore has been referred to interstitial lung disease clinic  American College of chest physicians interstitial lung disease questionnaire  : He has symptoms of cough occasionally but not bothersome for several years.  It is cough at night and it is a dry cough without any sputum.  He has mild shortness of breath when hurrying on level ground or walking up a slight hill.  This is been going on for 2 years without any change.  In terms of his past medical history he has some arthralgia which is due to rheumatoid arthritis and is on Humira.  He has acid reflux disease.  Otherwise past medical history is negative  In terms of personal exposure history: He is a heavy smoker started smoking at age 46.  Smokes 20 cigarettes a day.  He is trying to quit.  He does not want the help of Chantix or Zyban.  He is trying to quit on his own by reducing the number of cigarettes he smokes each day.  He does not use any street drugs.  In terms of family history of lung disease: Positive for COPD.  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  He thinks his father-in-law got this from paint exposure.  There is no pulmonary fibrosis and the blood  family  Home exposure history: Negative for humidifier [place] or hot tub or Jacuzzi O.  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  So he gets both bleach exposure and mold exposure.  He gets exposed at least 4 homes each year for the last 35 years.  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  There were around 40 pigeons and a small 100 square feet room.  He would visit these patients once a week for 2 years.  He would then feed them and pick them up to race.  During this time he got exposed to significant amount of bird feathers and toilet.  Occupational history: As a Curator as above involving bleach, paint fumes and mold  Pulmonary toxicity history: Negative.  Denies any amiodarone or beta really mL cobalt or radiation or chemotherapy or BCG or nitrofurantoin exposure  Walking desaturation test on 03/27/2017 185 feet x 3 laps on ROOM AIR:  did not desaturate. Rest pulse ox was 100%, final pulse ox was 98%. HR response 63/min at rest to 86/min at peak exertion. Patient Robert Greene  Did not Desaturate <  88% . Robert Greene did not  Desaturated </= 3% points. Robert Greene did not get tachyardic   IMPRESSION: 1. The appearance of the lungs is compatible with interstitial lung disease, with a CT pattern considered indeterminate for UIP (usual interstitial pneumonia). 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 2. Mild diffuse bronchial wall thickening with mild centrilobular and paraseptal emphysema; imaging findings suggestive of underlying COPD. 3. Aortic atherosclerosis, in addition to  left anterior descending coronary artery disease. Please note that although the presence of coronary artery calcium documents the presence of coronary artery disease, the severity of this disease and any potential stenosis cannot be assessed on this non-gated CT examination. Assessment for potential risk factor modification, dietary therapy or pharmacologic therapy may be warranted, if clinically indicated.  Aortic Atherosclerosis (ICD10-I70.0) and Emphysema (ICD10-J43.9).   Electronically Signed   By: Vinnie Langton M.D.   On: 03/22/2017 10:25      OV 09/26/2017  Subjective:  Patient ID: Robert Greene, male , DOB: 01-17-1961 , age MRN: [id number] , ADDRESS: [place] [id number]   09/26/2017 -   Chief Complaint  Patient presents with  . Follow-up    HRCT performed 8/15 and PFT performed today. Pt states he will have occ SOB but denies any real complaints.     HPI Robert Greene 57 y.o. -presents for shortness of breath and ILD evaluation. In the interim he has seen Dr. Harriet Pho. He had a cardiac stress test in March 2019 actually and this was normal with an ejection fraction of 60%. He has seen Dr. Amil Amen of rheumatology 05/08/2017 and I reviewed this note.Diagnosis is polymyalgia rheumatica and seronegative rheumatoid arthritis. He has a history of heavy alcohol use.Marland Kitchen Appears July 2016 rheumatoid factor, CCP, ANA and IgM were negative. In this visit he was supposed to quit smoking and reevaluate. However he has not been able to quit smoking. He continues to spray paint. Continues to be exposed to pigeons. His primary function test ironically is normal as is the walk test but his high resolution CT chest August 2019 that I personally visualized the show ILD. Based on 2018 ATS criteria and mypersonal visualization opinion I think the pattern is indeterminate although the most recent radiologist has called this is probable UIP.      Simple office walk 185 feet  x  3 laps goal with forehead probe 09/26/2017   O2 used Room air  Number laps completed 3  Comments about pace Normal to mod pace  Resting Pulse Ox/HR 99% and 69/min  Final Pulse Ox/HR 99% and 89/min  Desaturated </= 88% no  Desaturated <= 3% points no  Got Tachycardic >/= 90/min No but almost  Symptoms at end of test no  Miscellaneous comments x    IMPRESSION:  1. Spectrum of findings suggestive of a mild fibrotic interstitial lung disease with slight basilar gradient and no frank honeycombing. 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 No appreciable progression since 02/14/2017 chest CT. 2. One vessel coronary atherosclerosis. 3. Mild diffuse hepatic steatosis.  Aortic Atherosclerosis (ICD10-I70.0) and Emphysema (ICD10-J43.9).   Electronically Signed   By: Ilona Sorrel M.D.   On: 09/06/2017 10:53  ROS     has a past medical history of Arthritis, Hypercholesteremia, Hypercholesteremia, and Hypertension.  reports that he has been smoking cigarettes. He has a 50.00 pack-year smoking history. He has never used smokeless tobacco.  Past Surgical History:  Procedure Laterality Date  . KNEE ARTHROPLASTY Left     Allergies  Allergen Reactions  . Bee Venom Anaphylaxis, Hives and Rash  . Spider Antivenin [Black Widow Spider Antivenin (L.Mactans)] Anaphylaxis, Hives and Rash     There is no immunization history on file for this patient.  No family history on file.   Current Outpatient Medications:  .  Adalimumab (HUMIRA) 40 MG/0.4ML PSKT, Inject 40 mg into the skin every 14 (fourteen) days. , Disp: , Rfl:  .  atorvastatin (LIPITOR) 80 MG tablet, Take 80 mg by mouth daily., Disp: , Rfl: 1 .  diphenhydrAMINE (BENADRYL) 25 MG tablet, Take 25 mg by mouth every 4 (four) hours as needed for itching., Disp: , Rfl:  .  gabapentin (NEURONTIN) 100 MG capsule, , Disp: , Rfl:  .  hydroxychloroquine (PLAQUENIL) 200 MG tablet, Patient been taking 1 a day,  Disp: , Rfl: 2 .  hydrOXYzine (ATARAX/VISTARIL) 25 MG tablet, Take 1 tablet (25 mg total) by mouth every 6 (six) hours., Disp: 12 tablet, Rfl: 0 .  lisinopril (PRINIVIL,ZESTRIL) 20 MG tablet, Take 20 mg by mouth daily., Disp: , Rfl:  .  naproxen sodium (ANAPROX) 220 MG tablet, Take 220 mg by mouth 2 (two) times daily with a meal., Disp: , Rfl:  .  Omega-3 Fatty Acids (FISH OIL PO), Take 2 capsules by mouth 2 (two) times daily. , Disp: , Rfl:  .  ranitidine (ZANTAC) 150 MG tablet, Take 150 mg by mouth 2 (two) times daily., Disp: , Rfl:  .  EPINEPHrine 0.3 mg/0.3 mL IJ SOAJ injection, Use once.  If ineffective, use 2nd dose. (Patient not taking: Reported on 09/26/2017), Disp: 2 Device, Rfl: 0      Objective:   Vitals:   09/26/17 1040  BP: 128/80  Pulse: 67  SpO2: 97%  Weight: 213 lb (96.6 kg)  Height: 5' 9.25" (1.759 m)    Estimated body mass index is 31.23 kg/m as calculated from the following:   Height as of this encounter: 5' 9.25" (1.759 m).   Weight as of this encounter: 213 lb (96.6 kg).  @WEIGHTCHANGE @  [employer]   09/26/17 1040  Weight: 213 lb (96.6 kg)     Physical Exam  mainly discusion visit    Assessment:       ICD-10-CM   1. Interstitial pulmonary disease ([place]) J84.9   2. Exposure to paint fumes Z77.098   3. Mold exposure Z77.120   4. Cigarette smoker F17.210   5. Seronegative rheumatoid arthritis ([place]) M06.00        Plan:     There is still mild persistent interstitial lung disease or pulmonary fibrosis Cause of this is unknown but he has several risk factors as mentioned above. Based on our extensive discussion about the risks, benefits and limitations of different approaches we have agreed especially based on the fact he could not quit smoking since last visit  PLAN  -refer Dr Roxan Hockey - CVTS for surgical lung biopsy eval - biopsy decision based on early lung disease ILD, need to make a diagnosis, prognostic information and therapeutic  information. And the fact is unable to quit smoking  Followup 8 weeks in ILD clinic   > 50% of this > 25 min visit spent in face to face counseling or coordination of care - by this undersigned MD -  Dr Brand Males. This includes one or more of the following documented above: discussion of test results, diagnostic or treatment recommendations, prognosis, risks and benefits of management options, instructions, education, compliance or risk-factor reduction          SIGNATURE    Dr. Brand Males, M.D., F.C.C.P,  Pulmonary and Critical Care Medicine Staff Physician, [place] Director - Interstitial Lung Disease  Program  Pulmonary [place] at [place], [place], [address]  Pager: [phone], If no answer or between  15:00h - 7:00h: call [phone] Telephone: [phone]  11:34 AM 09/26/2017

## 2017-09-26 NOTE — Addendum Note (Signed)
Addended by: Jannette Spanner on: 09/26/2017 11:53 AM   Modules accepted: Orders

## 2017-09-26 NOTE — Patient Instructions (Addendum)
Interstitial pulmonary disease (HCC) Exposure to paint fumes Mold exposure Cigarette smoker Seronegative rheumatoid arthritis ([place])   There is still mild persistent interstitial lung disease or pulmonary fibrosis Cause of this is unknown but he has several risk factors as mentioned above. Based on our extensive discussion about the risks, benefits and limitations of different approaches we have agreed especially based on the fact he could not quit smoking since last visit  PLAN  -refer Dr Roxan Hockey - CV[facility] for surgical lung biopsy eval  Followup 8 weeks in ILD clinic

## 2017-10-15 ENCOUNTER — Encounter: Payer: Self-pay | Admitting: Thoracic Surgery (Cardiothoracic Vascular Surgery)

## 2017-10-15 ENCOUNTER — Other Ambulatory Visit: Payer: Self-pay

## 2017-10-15 ENCOUNTER — Institutional Professional Consult (permissible substitution) (INDEPENDENT_AMBULATORY_CARE_PROVIDER_SITE_OTHER): Payer: 59 | Admitting: Thoracic Surgery (Cardiothoracic Vascular Surgery)

## 2017-10-15 VITALS — BP 130/84 | HR 64 | Resp 18 | Ht 69.25 in | Wt 212.4 lb

## 2017-10-15 DIAGNOSIS — J849 Interstitial pulmonary disease, unspecified: Secondary | ICD-10-CM | POA: Diagnosis not present

## 2017-10-15 NOTE — Progress Notes (Signed)
PCP is [name], MD Referring Provider is Brand Males, MD  Chief Complaint  Patient presents with  . Interstitial Lung Disease    new patient consultation Chest CT 09/06/17, PFTs 09/26/17    HPI: [name] is a 57 year old gentleman with a history of tobacco abuse, hypertension, hyperlipidemia, and rheumatoid arthritis.  He smoked about 1-1/4 packs of cigarettes for 40 years.  He saw Dr. Rex Kras back in January.  He recommended a low-dose screening CT for lung cancer.  There were no suspicious lung nodules, but the CT did show evidence of interstitial lung disease.  He was referred to Dr. [name].  He recently had a follow-up CT which again showed interstitial lung disease without significant honeycombing.  He does have some mild exertional shortness of breath.  He is currently in the process of applying for disability.  He is able to walk up and down stairs and climb ladders.  He has been trying to quit smoking.  He is not having any chest pain, pressure, or tightness at rest or with exertion. Zubrod Score: At the time of surgery this patient's most appropriate activity status/level should be described as: []     0    Normal activity, no symptoms [x]     1    Restricted in physical strenuous activity but ambulatory, able to do out light work []     2    Ambulatory and capable of self care, unable to do work activities, up and about >50 % of waking hours                              []     3    Only limited self care, in bed greater than 50% of waking hours []     4    Completely disabled, no self care, confined to bed or chair []     5    Moribund  Past Medical History:  Diagnosis Date  . Arthritis    rheumatoid  . Hypercholesteremia   . Hypercholesteremia   . Hypertension     Past Surgical History:  Procedure Laterality Date  . KNEE ARTHROPLASTY Left     No family history on file.  Social History Social History   Tobacco Use  . Smoking status: Current Every Day  Smoker    Packs/day: 1.25    Years: 40.00    Pack years: 50.00    Types: Cigarettes  . Smokeless tobacco: Never Used  . Tobacco comment: currently smoking 0.5ppd as of 09/26/17 ep  Substance Use Topics  . Alcohol use: Yes    Alcohol/week: 1.0 standard drinks    Types: 1 Standard drinks or equivalent per week  . Drug use: No    Current Outpatient Medications  Medication Sig Dispense Refill  . Adalimumab (HUMIRA) 40 MG/0.4ML PSKT Inject 40 mg into the skin every 14 (fourteen) days.     Marland Kitchen atorvastatin (LIPITOR) 80 MG tablet Take 80 mg by mouth daily.  1  . diphenhydrAMINE (BENADRYL) 25 MG tablet Take 25 mg by mouth every 4 (four) hours as needed for itching.    Marland Kitchen EPINEPHrine 0.3 mg/0.3 mL IJ SOAJ injection Use once.  If ineffective, use 2nd dose. 2 Device 0  . gabapentin (NEURONTIN) 100 MG capsule     . hydroxychloroquine (PLAQUENIL) 200 MG tablet Patient been taking 1 a day  2  . hydrOXYzine (ATARAX/VISTARIL) 25 MG tablet Take 1 tablet (25 mg total) by  mouth every 6 (six) hours. 12 tablet 0  . lisinopril (PRINIVIL,ZESTRIL) 20 MG tablet Take 20 mg by mouth daily.    . naproxen sodium (ANAPROX) 220 MG tablet Take 220 mg by mouth 2 (two) times daily with a meal.    . Omega-3 Fatty Acids (FISH OIL PO) Take 2 capsules by mouth 2 (two) times daily.     . ranitidine (ZANTAC) 150 MG tablet Take 150 mg by mouth 2 (two) times daily.     No current facility-administered medications for this visit.     Allergies  Allergen Reactions  . Bee Venom Anaphylaxis, Hives and Rash  . Spider Antivenin [Black Widow Spider Antivenin (L.Mactans)] Anaphylaxis, Hives and Rash    Review of Systems  Constitutional: Negative for activity change, chills, fever and unexpected weight change.  HENT: Negative for trouble swallowing and voice change.   Eyes: Negative for visual disturbance.  Respiratory: Positive for shortness of breath. Negative for cough and wheezing.   Cardiovascular: Negative for chest pain,  palpitations and leg swelling.  Gastrointestinal: Positive for abdominal pain (Frequent heartburn). Negative for abdominal distention.  Genitourinary: Negative for difficulty urinating and dysuria.  Musculoskeletal: Positive for arthralgias and joint swelling. Negative for gait problem.  Neurological: Negative for seizures, syncope and weakness.  Hematological: Negative for adenopathy. Does not bruise/bleed easily.  All other systems reviewed and are negative.   BP 130/84 (BP Location: Left Arm, Patient Position: Sitting, Cuff Size: Normal)   Pulse 64   Resp 18   Ht 5' 9.25" (1.759 m)   Wt 212 lb 6.4 oz (96.3 kg)   SpO2 98% Comment: RA  BMI 31.14 kg/m  Physical Exam  Constitutional: He is oriented to person, place, and time. He appears well-developed and well-nourished. No distress.  HENT:  Head: Normocephalic and atraumatic.  Mouth/Throat: No oropharyngeal exudate.  Eyes: Pupils are equal, round, and reactive to light. Conjunctivae and EOM are normal. No scleral icterus.  Neck: No thyromegaly present.  Cardiovascular: Normal rate, regular rhythm and normal heart sounds. Exam reveals no gallop and no friction rub.  No murmur heard. Pulmonary/Chest: Effort normal and breath sounds normal. No respiratory distress. He has no wheezes. He has no rales.  Abdominal: Soft. He exhibits no distension. There is no tenderness.  Musculoskeletal: He exhibits deformity (Mild joint swelling hands). He exhibits no edema.  Lymphadenopathy:    He has no cervical adenopathy.  Neurological: He is alert and oriented to person, place, and time. No cranial nerve deficit. He exhibits normal muscle tone. Coordination normal.  Skin: Skin is warm and dry.  Vitals reviewed.    Diagnostic Tests: CT CHEST WITHOUT CONTRAST  TECHNIQUE: Multidetector CT imaging of the chest was performed following the standard protocol without intravenous contrast. High resolution imaging of the lungs, as well as  inspiratory and expiratory imaging, was performed.  COMPARISON:  03/22/2017 high-resolution chest CT.  FINDINGS: Cardiovascular: Normal heart size. No significant pericardial effusion/thickening. Left anterior descending coronary atherosclerosis. Atherosclerotic nonaneurysmal thoracic aorta. Normal caliber pulmonary arteries.  Mediastinum/Nodes: No discrete thyroid nodules. Unremarkable esophagus. No pathologically enlarged axillary, mediastinal or hilar lymph nodes, noting limited sensitivity for the detection of hilar adenopathy on this noncontrast study.  Lungs/Pleura: No pneumothorax. No pleural effusion. Mild centrilobular and paraseptal emphysema with diffuse bronchial wall thickening and saber sheath trachea. No acute consolidative airspace disease, lung masses or significant pulmonary nodules. There is patchy subpleural reticulation and ground-glass attenuation in both lungs with associated minimal traction bronchiolectasis. No frank honeycombing. No  significant air trapping on the expiration sequence. Slight basilar gradient to these findings. Findings asymmetrically involve the right lung. No appreciable progression since 02/14/2017 chest CT.  Upper abdomen: Mild diffuse hepatic steatosis.  Musculoskeletal: No aggressive appearing focal osseous lesions. Mild thoracic spondylosis.  IMPRESSION: 1. Spectrum of findings suggestive of a mild fibrotic interstitial lung disease with slight basilar gradient and no frank honeycombing. 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 No appreciable progression since 02/14/2017 chest CT. 2. One vessel coronary atherosclerosis. 3. Mild diffuse hepatic steatosis.  Aortic Atherosclerosis (ICD10-I70.0) and Emphysema (ICD10-J43.9).   Electronically Signed   By: Ilona Sorrel M.D.   On: 09/06/2017 10:53 I personally reviewed the CT images and concur with the findings noted above  Impression: Mr.  Marling is a 57 year old gentleman with a history of tobacco abuse, hypertension, hyperlipidemia, and rheumatoid arthritis.  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  He has some exposure to chickens and pigeons which his brother owns, that has been rather minor.  He had a low-dose screening CT for lung cancer due to his smoking history back in January.  He was found to have interstitial lung disease.  High-resolution CT there is mild fibrotic interstitial lung disease without frank honeycombing.  Dr. [name] feels that a lung biopsy will be useful in providing a definitive diagnosis that we will give prognostic information and help guide therapy.  I discussed the procedure with him.  We will plan to do a right VATS for lung biopsy.  I informed him of the general nature of the procedure, the need for general anesthesia, the incisions to be used, the use of a drainage tube postoperatively, the expected hospital stay, and the overall recovery.  He understands this is diagnostic and not therapeutic.  I informed him of the indications, risk, benefits, and alternatives.  He understands the risks include, but are not limited to death, MI, DVT, PE, bleeding, possible need for transfusion, infection, prolonged air leak, cardiac arrhythmias, as well as possibility of other unforeseeable complications.  He thinks he wants to proceed with the biopsy, but wants to think about it before setting a date for the procedure.  He is trying to quit smoking and wants to get further along with that process before having the procedure done.  He also is waiting on some disability paperwork.  He thinks he may want to do the procedure in mid or late October.  Plan: Right VATS for lung biopsy for interstitial lung disease.  Patient will call to schedule.  Melrose Nakayama, MD [facility] [phone]

## 2017-11-22 ENCOUNTER — Ambulatory Visit (INDEPENDENT_AMBULATORY_CARE_PROVIDER_SITE_OTHER): Payer: 59 | Admitting: Internal Medicine

## 2017-11-22 ENCOUNTER — Encounter: Payer: Self-pay | Admitting: Internal Medicine

## 2017-11-22 VITALS — BP 132/88 | HR 73 | Ht 70.0 in | Wt 215.0 lb

## 2017-11-22 DIAGNOSIS — Z23 Encounter for immunization: Secondary | ICD-10-CM | POA: Diagnosis not present

## 2017-11-22 DIAGNOSIS — J849 Interstitial pulmonary disease, unspecified: Secondary | ICD-10-CM | POA: Diagnosis not present

## 2017-11-22 NOTE — Addendum Note (Signed)
Addended by: Karmen Stabs on: 11/22/2017 02:14 PM   Modules accepted: Orders

## 2017-11-22 NOTE — Patient Instructions (Signed)
ICD-10-CM   1. ILD (interstitial lung disease) ([place]) J84.9      Please make appointment with Dr Roxan Hockey and schedule lung biopsy Pneumovax vaccine 11/22/2017 Please talk to PCP [name], MD -  and ensure you get  shingrix (GSK) inactivated vaccine against shingles   Followup 6 weeks to ILD clinic to discuss biopsy results

## 2017-11-22 NOTE — Progress Notes (Signed)
OV 03/27/2017 - ILD CLINIC  Chief Complaint  Patient presents with  . Consult    Consult per Eric Form, NP for ILD seen on HRCT 03/22/17.  Pt states he has occ. SOB, and sinus problems. Denies any cough or CP.   57 year old male referred for interstitial lung disease.  He is known to have rheumatoid arthritis for a few years and is on Humira.  He is also a heavy smoker.  He underwent screening low-dose CT scan of the chest and of January 2019 and this showed interstitial lung disease.  This was followed up by high-resolution CT chest which has confirmed interstitial lung disease.  According to thoracic radiology it is indeterminate for UIP.  There is also associated emphysema.  Therefore has been referred to interstitial lung disease clinic  [employer] College of chest physicians interstitial lung disease questionnaire  : He has symptoms of cough occasionally but not bothersome for several years.  It is cough at night and it is a dry cough without any sputum.  He has mild shortness of breath when hurrying on level ground or walking up a slight hill.  This is been going on for 2 years without any change.  In terms of his past medical history he has some arthralgia which is due to rheumatoid arthritis and is on Humira.  He has acid reflux disease.  Otherwise past medical history is negative  In terms of personal exposure history: He is a heavy smoker started smoking at age 13.  Smokes 20 cigarettes a day.  He is trying to quit.  He does not want the help of Chantix or Zyban.  He is trying to quit on his own by reducing the number of cigarettes he smokes each day.  He does not use any street drugs.  In terms of family history of lung disease: Positive for COPD.  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  He thinks his father-in-law got this from paint exposure.  There is no pulmonary fibrosis and the blood family  Home  exposure history: Negative for humidifier [place] or hot tub or Jacuzzi O.  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  So he gets both bleach exposure and mold exposure.  He gets exposed at least 4 homes each year for the last 35 years.  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  There were around 40 pigeons and a small 100 square feet room.  He would visit these patients once a week for 2 years.  He would then feed them and pick them up to race.  During this time he got exposed to significant amount of bird feathers and toilet.  Occupational history: As a Curator as above involving bleach, paint fumes and mold  Pulmonary toxicity history: Negative.  Denies any amiodarone or beta really mL cobalt or radiation or chemotherapy or BCG or nitrofurantoin exposure  Walking desaturation test on 03/27/2017 185 feet x 3 laps on ROOM AIR:  did not desaturate. Rest pulse ox was 100%, final pulse ox was 98%. HR response 63/min at rest to 86/min at peak exertion. Patient Robert Greene  Did not Desaturate < 88% .  Robert Greene did not  Desaturated </= 3% points. Robert Greene did not get tachyardic   IMPRESSION: 1. The appearance of the lungs is compatible with interstitial lung disease, with a CT pattern considered indeterminate for UIP (usual interstitial pneumonia). 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 2. Mild diffuse bronchial wall thickening with mild centrilobular and paraseptal emphysema; imaging findings suggestive of underlying COPD. 3. Aortic atherosclerosis, in addition to left anterior  descending coronary artery disease. Please note that although the presence of coronary artery calcium documents the presence of coronary artery disease, the severity of this disease and any potential stenosis cannot be assessed on this non-gated CT examination. Assessment for potential risk factor modification, dietary therapy or pharmacologic therapy may be warranted, if clinically indicated.  Aortic Atherosclerosis (ICD10-I70.0) and Emphysema (ICD10-J43.9).   Electronically Signed   By: Vinnie Langton M.D.   On: 03/22/2017 10:25      OV 09/26/2017  Subjective:  Patient ID: Robert Greene, male , DOB: 10/15/1960 , age MRN: [id number] , ADDRESS: [place] [id number]   09/26/2017 -   Chief Complaint  Patient presents with  . Follow-up    HRCT performed 8/15 and PFT performed today. Pt states he will have occ SOB but denies any real complaints.     HPI Robert Greene 57 y.o. -presents for shortness of breath and ILD evaluation. In the interim he has seen Dr. Harriet Pho. He had a cardiac stress test in March 2019 actually and this was normal with an ejection fraction of 60%. He has seen Dr. Amil Amen of rheumatology 05/08/2017 and I reviewed this note.Diagnosis is polymyalgia rheumatica and seronegative rheumatoid arthritis. He has a history of heavy alcohol use.Marland Kitchen Appears July 2016 rheumatoid factor, CCP, ANA and IgM were negative. In this visit he was supposed to quit smoking and reevaluate. However he has not been able to quit smoking. He continues to spray paint. Continues to be exposed to pigeons. His primary function test ironically is normal as is the walk test but his high resolution CT chest August 2019 that I personally visualized the show ILD. Based on 2018 ATS criteria and mypersonal visualization opinion I think the pattern is indeterminate although the most recent radiologist has called this is probable UIP.     IMPRESSION:  1. Spectrum of findings  suggestive of a mild fibrotic interstitial lung disease with slight basilar gradient and no frank honeycombing. 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 No appreciable progression since 02/14/2017 chest CT. 2. One vessel coronary atherosclerosis. 3. Mild diffuse hepatic steatosis.  Aortic Atherosclerosis (ICD10-I70.0) and Emphysema (ICD10-J43.9).   Electronically Signed   By: Ilona Sorrel M.D.   On: 09/06/2017 10:53   OV 11/22/2017  Subjective:  Patient ID: Robert Greene, male , DOB: 09-Oct-1960 , age 34 y.o. , MRN: [id number] , ADDRESS: [place] [address]   11/22/2017 -   Chief Complaint  Patient presents with  . Follow-up    States his breathing is the same at his baseline. No new concerns.      HPI Robert Greene 57 y.o. -interstitial lung disease not otherwise specified.  Here for follow-up.  This visit was supposed to be after surgical lung biopsy.  However he visited with Dr. Roxan Hockey and decided  to defer the date of the biopsy because he wanted to go on disability.  He now is on disability and feels he is ready to have surgical lung biopsy.  He says he will call Dr. Roxan Hockey and make an appointment.  In the interim he feels he is mildly more short of breath especially before inclines and steps than before.  Otherwise no other new problems.  Immunization record shows that he could benefit from Pneumovax.  He is up-to-date with the flu shot.  Walking desaturation test suggest possible worsening and tachycardia than before     Simple office walk 185 feet x  3 laps goal with forehead probe 09/26/2017  11/22/2017   O2 used Room air Room air  Number laps completed 3 3  Comments about pace Normal to mod pace   Resting Pulse Ox/HR 99% and 69/min 100% and 73/min  Final Pulse Ox/HR 99% and 89/min 100% and 97/min  Desaturated </= 88% no no  Desaturated <= 3% points no no  Got Tachycardic >/= 90/min No but almost yes  Symptoms  at end of test no x  Miscellaneous comments x x     ROS - per HPI     has a past medical history of Arthritis, Hypercholesteremia, Hypercholesteremia, and Hypertension.   reports that he has been smoking cigarettes. He has a 50.00 pack-year smoking history. He has never used smokeless tobacco.  Past Surgical History:  Procedure Laterality Date  . KNEE ARTHROPLASTY Left     Allergies  Allergen Reactions  . Bee Venom Anaphylaxis, Hives and Rash  . Spider Antivenin [Black Widow Spider Antivenin (L.Mactans)] Anaphylaxis, Hives and Rash    Immunization History  Administered Date(s) Administered  . Influenza,inj,Quad PF,6+ Mos 09/26/2017    No family history on file.   Current Outpatient Medications:  .  Adalimumab (HUMIRA) 40 MG/0.4ML PSKT, Inject 40 mg into the skin every 14 (fourteen) days. , Disp: , Rfl:  .  atorvastatin (LIPITOR) 80 MG tablet, Take 80 mg by mouth daily., Disp: , Rfl: 1 .  diphenhydrAMINE (BENADRYL) 25 MG tablet, Take 25 mg by mouth every 4 (four) hours as needed for itching., Disp: , Rfl:  .  EPINEPHrine 0.3 mg/0.3 mL IJ SOAJ injection, Use once.  If ineffective, use 2nd dose., Disp: 2 Device, Rfl: 0 .  gabapentin (NEURONTIN) 100 MG capsule, , Disp: , Rfl:  .  hydroxychloroquine (PLAQUENIL) 200 MG tablet, Patient been taking 1 a day, Disp: , Rfl: 2 .  lisinopril (PRINIVIL,ZESTRIL) 20 MG tablet, Take 20 mg by mouth daily., Disp: , Rfl:  .  naproxen sodium (ANAPROX) 220 MG tablet, Take 220 mg by mouth 2 (two) times daily with a meal., Disp: , Rfl:  .  Omega-3 Fatty Acids (FISH OIL PO), Take 2 capsules by mouth 2 (two) times daily. , Disp: , Rfl:  .  ranitidine (ZANTAC) 150 MG tablet, Take 150 mg by mouth 2 (two) times daily., Disp: , Rfl:       Objective:   Vitals:   11/22/17 1328  BP: 132/88  Pulse: 73  SpO2: (!) 10%  Weight: 215 lb (97.5 kg)  Height: 5\' 10"  (1.778 m)    Estimated body mass index is 30.85 kg/m as calculated from the  following:   Height as of this encounter: 5\' 10"  (1.778 m).   Weight as of this encounter: 215 lb (97.5 kg).  @WEIGHTCHANGE @  Filed Weights   11/22/17 1328  Weight: 215 lb (97.5 kg)  Physical Exam  General Appearance:    Alert, cooperative, no distress, appears stated age - yes , Deconditioned looking - no , OBESE  - overwight +, Sitting on Wheelchair -  no  Head:    Normocephalic, without obvious abnormality, atraumatic  Eyes:    PERRL, conjunctiva/corneas clear,  Ears:    Normal TM's and external ear canals, both ears  Nose:   Nares normal, septum midline, mucosa normal, no drainage    or sinus tenderness. OXYGEN ON  - no . Patient is @ ra   Throat:   Lips, mucosa, and tongue normal; teeth and gums normal. Cyanosis on lips - no  Neck:   Supple, symmetrical, trachea midline, no adenopathy;    thyroid:  no enlargement/tenderness/nodules; no carotid   bruit or JVD  Back:     Symmetric, no curvature, ROM normal, no CVA tenderness  Lungs:     Distress - no , Wheeze no, Barrell Chest - no, Purse lip breathing - no, Crackles - very faint basal crackles   Chest Wall:    No tenderness or deformity.    Heart:    Regular rate and rhythm, S1 and S2 normal, no rub   or gallop, Murmur - no  Breast Exam:    NOT DONE  Abdomen:     Soft, non-tender, bowel sounds active all four quadrants,    no masses, no organomegaly. Visceral obesity - yes  Genitalia:   NOT DONE  Rectal:   NOT DONE  Extremities:   Extremities - normal, Has Cane - no, Clubbing - no, Edema - no  Pulses:   2+ and symmetric all extremities  Skin:   Stigmata of Connective Tissue Disease - no  Lymph nodes:   Cervical, supraclavicular, and axillary nodes normal  Psychiatric:  Neurologic:   Pleasant - yes, Anxious - no, Flat affect - no  CAm-ICU - neg, Alert and Oriented x 3 - yes, Moves all 4s - yes, Speech - normal, Cognition - intact           Assessment:       ICD-10-CM   1. ILD (interstitial lung disease) ([place])  J84.9        Plan:     Patient Instructions     ICD-10-CM   1. ILD (interstitial lung disease) ([place]) J84.9      Please make appointment with Dr Roxan Hockey and schedule lung biopsy Pneumovax vaccine 11/22/2017 Please talk to PCP [name], MD -  and ensure you get  shingrix (GSK) inactivated vaccine against shingles   Followup 6 weeks to ILD clinic to discuss biopsy results   (> 50% of this 15 min visit spent in face to face counseling or/and coordination of care by this undersigned MD - Dr Brand Males. This includes one or more of the following documented above: discussion of test results, diagnostic or treatment recommendations, prognosis, risks and benefits of management options, instructions, education, compliance or risk-factor reduction)   SIGNATURE    Dr. Brand Males, M.D., F.C.C.P,  Pulmonary and Critical Care Medicine Staff Physician, [place] Director - Interstitial Lung Disease  Program  [facility] [place] [facility] [place], [place], [address]  Pager: [phone], If no answer or between  15:00h - 7:00h: call [phone] Telephone: [phone]  1:59 PM 11/22/2017

## 2017-11-28 ENCOUNTER — Encounter (HOSPITAL_COMMUNITY): Payer: Self-pay | Admitting: Emergency Medicine

## 2017-11-28 ENCOUNTER — Emergency Department (HOSPITAL_COMMUNITY)
Admission: EM | Admit: 2017-11-28 | Discharge: 2017-11-28 | Discharge status: Against Medical Advice | Payer: 59 | Attending: Emergency Medicine | Admitting: Emergency Medicine

## 2017-11-28 ENCOUNTER — Other Ambulatory Visit: Payer: Self-pay

## 2017-11-28 ENCOUNTER — Telehealth: Payer: Self-pay | Admitting: Internal Medicine

## 2017-11-28 DIAGNOSIS — Z5321 Procedure and treatment not carried out due to patient leaving prior to being seen by health care provider: Secondary | ICD-10-CM | POA: Diagnosis not present

## 2017-11-28 DIAGNOSIS — L500 Allergic urticaria: Secondary | ICD-10-CM | POA: Diagnosis present

## 2017-11-28 NOTE — Telephone Encounter (Signed)
I spoke with Robert Greene and she states that you gave her a verbal order to schedule this. This has been canceled at this time.

## 2017-11-28 NOTE — ED Notes (Signed)
Bed: WA07 Expected date:  Expected time:  Means of arrival:  Comments: 63 M Anaphylaxis, epi given

## 2017-11-28 NOTE — Telephone Encounter (Signed)
Patient is aware and he will make a follow up apt with Dr. Gorden Harms.Nothing further needed at this time.

## 2017-11-28 NOTE — ED Notes (Signed)
Patient states he has had 5-6 episodes of allergic reaction. His doctor think it is rheumatoid arthritis. Patient has rash all down right side. Patient states he feels fine.

## 2017-11-28 NOTE — ED Notes (Signed)
Patient requested for IV to be taken out and he was leaving because the doctor was taking to long. Patient has ETOH on board.

## 2017-11-28 NOTE — Telephone Encounter (Signed)
Called and spoke with Robert Greene I let her know that we would be canceling the bronch for 11/29/17. We would be calling to reschedule the bronch.    Patient is aware that bronch has been canceled and that we would call back to reschedule.  MR please advise on dates you are able  to do the Bronch.

## 2017-11-28 NOTE — ED Triage Notes (Signed)
Patient had anaphylatic reaction. He had hives, sob, and flushed all over. Patient waited 2 hours before calling EMS. Patient is itching on face but is not having the sob now.

## 2017-11-28 NOTE — ED Triage Notes (Signed)
Per EMS, pt was given Solu-Medrol 125 mg IV and epi 0.3 mg and Benadryl 100 mg.

## 2017-11-28 NOTE — Telephone Encounter (Signed)
reivew of my 11/22/17 notes indicate that he is supposed to Dr Roxan Hockey for surgical lung bx. My notes do NOT indicate scheduling bronchoscopy. I called Baxter Flattery in [place] 3:24 PM 11/28/2017 and she said that patient was confused as well. She said [place] from our office scheduled the bronchoscopy on10/31/19. Could you please investigate how this happened? Did I make an error?  Also, please get patient an appt with DR Roxan Hockey.

## 2017-11-29 ENCOUNTER — Other Ambulatory Visit: Payer: Self-pay | Admitting: *Deleted

## 2017-11-29 ENCOUNTER — Encounter: Payer: Self-pay | Admitting: *Deleted

## 2017-11-29 ENCOUNTER — Inpatient Hospital Stay (HOSPITAL_COMMUNITY)
Admission: RE | Admit: 2017-11-29 | Discharge: 2017-11-29 | Disposition: A | Discharge status: Home / Self Care | Payer: 59 | Source: Ambulatory Visit

## 2017-11-29 ENCOUNTER — Ambulatory Visit (HOSPITAL_COMMUNITY): Admission: RE | Admit: 2017-11-29 | Payer: 59 | Source: Ambulatory Visit | Admitting: Internal Medicine

## 2017-11-29 ENCOUNTER — Encounter (HOSPITAL_COMMUNITY): Admission: RE | Payer: Self-pay | Source: Ambulatory Visit

## 2017-11-29 DIAGNOSIS — J849 Interstitial pulmonary disease, unspecified: Secondary | ICD-10-CM

## 2017-11-29 SURGERY — BRONCHOSCOPY, WITH FLUOROSCOPY
Anesthesia: Moderate Sedation | Laterality: Bilateral

## 2017-12-04 NOTE — Pre-Procedure Instructions (Signed)
IVAL PACER  12/04/2017      [id number] - [place], [place] [address] Phone: [phone] Fax: [phone]    Your procedure is scheduled on December 06, 2017.  Report to [facility] Admitting at 1045 AM.  Call this number if you have problems the morning of surgery:  [phone]   Remember:  Do not eat or drink after midnight.    Take these medicines the morning of surgery with A SIP OF WATER  Hydroxychloroquine (plaquenil) Epi Pen-if needed Benadryl-if needed  7 days prior to surgery STOP taking any Aspirin (unless otherwise instructed by your surgeon), Aleve, Naproxen, Ibuprofen, Motrin, Advil, Goody's, BC's, all herbal medications, fish oil, and all vitamins    Do not wear jewelry  Do not wear lotions, powders, or colognes, or deodorant.  Men may shave face and neck.  Do not bring valuables to the hospital.  [facility] is not responsible for any belongings or valuables.  Contacts, dentures or bridgework may not be worn into surgery.  Leave your suitcase in the car.  After surgery it may be brought to your room.  For patients admitted to the hospital, discharge time will be determined by your treatment team.  Patients discharged the day of surgery will not be allowed to drive home.   [facility]- Preparing For Surgery  Before surgery, you can play an important role. Because skin is not sterile, your skin needs to be as free of germs as possible. You can reduce the number of germs on your skin by washing with CHG (chlorahexidine gluconate) Soap before surgery.  CHG is an antiseptic cleaner which kills germs and bonds with the skin to continue killing germs even after washing.    Oral Hygiene is also important to reduce your risk of infection.  Remember - BRUSH YOUR TEETH THE MORNING OF SURGERY WITH YOUR REGULAR TOOTHPASTE  Please do not use if you have an allergy to CHG or antibacterial soaps.  If your skin becomes reddened/irritated stop using the CHG.  Do not shave (including legs and underarms) for at least 48 hours prior to first CHG shower. It is OK to shave your face.  Please follow these instructions carefully.   1. Shower the NIGHT BEFORE SURGERY and the MORNING OF SURGERY with CHG.   2. If you chose to wash your hair, wash your hair first as usual with your normal shampoo.  3. After you shampoo, rinse your hair and body thoroughly to remove the shampoo.  4. Use CHG as you would any other liquid soap. You can apply CHG directly to the skin and wash gently with a scrungie or a clean washcloth.   5. Apply the CHG Soap to your body ONLY FROM THE NECK DOWN.  Do not use on open wounds or open sores. Avoid contact with your eyes, ears, mouth and genitals (private parts). Wash Face and genitals (private parts)  with your normal soap.  6. Wash thoroughly, paying special attention to the area where your surgery will be performed.  7. Thoroughly rinse your body with warm water from the neck down.  8. DO NOT shower/wash with your normal soap after using and rinsing off the CHG Soap.  9. Pat yourself dry with a CLEAN TOWEL.  10. Wear CLEAN PAJAMAS to bed the night before surgery, wear comfortable clothes the morning of surgery  11. Place CLEAN SHEETS on your bed the night of your  first shower and DO NOT SLEEP WITH PETS.  Day of Surgery:  Do not apply any deodorants/lotions.  Please wear clean clothes to the hospital/surgery center.   Remember to brush your teeth WITH YOUR REGULAR TOOTHPASTE.  Please read over the following fact sheets that you were given. Pain Booklet, Coughing and Deep Breathing, MRSA Information and Surgical Site Infection Prevention

## 2017-12-05 ENCOUNTER — Ambulatory Visit (HOSPITAL_COMMUNITY)
Admission: RE | Admit: 2017-12-05 | Discharge: 2017-12-05 | Disposition: A | Discharge status: Home / Self Care | Payer: 59 | Source: Ambulatory Visit | Attending: Thoracic Surgery (Cardiothoracic Vascular Surgery) | Admitting: Thoracic Surgery (Cardiothoracic Vascular Surgery)

## 2017-12-05 ENCOUNTER — Encounter (HOSPITAL_COMMUNITY)
Admission: RE | Admit: 2017-12-05 | Discharge: 2017-12-05 | Disposition: A | Discharge status: Home / Self Care | Payer: 59 | Source: Ambulatory Visit | Attending: Thoracic Surgery (Cardiothoracic Vascular Surgery) | Admitting: Thoracic Surgery (Cardiothoracic Vascular Surgery)

## 2017-12-05 ENCOUNTER — Encounter (HOSPITAL_COMMUNITY): Payer: Self-pay

## 2017-12-05 ENCOUNTER — Other Ambulatory Visit: Payer: Self-pay

## 2017-12-05 DIAGNOSIS — F419 Anxiety disorder, unspecified: Secondary | ICD-10-CM | POA: Insufficient documentation

## 2017-12-05 DIAGNOSIS — J8410 Pulmonary fibrosis, unspecified: Secondary | ICD-10-CM

## 2017-12-05 DIAGNOSIS — Z01818 Encounter for other preprocedural examination: Secondary | ICD-10-CM | POA: Insufficient documentation

## 2017-12-05 DIAGNOSIS — E7800 Pure hypercholesterolemia, unspecified: Secondary | ICD-10-CM

## 2017-12-05 DIAGNOSIS — M069 Rheumatoid arthritis, unspecified: Secondary | ICD-10-CM | POA: Insufficient documentation

## 2017-12-05 DIAGNOSIS — K760 Fatty (change of) liver, not elsewhere classified: Secondary | ICD-10-CM | POA: Insufficient documentation

## 2017-12-05 DIAGNOSIS — J439 Emphysema, unspecified: Secondary | ICD-10-CM | POA: Insufficient documentation

## 2017-12-05 DIAGNOSIS — F17210 Nicotine dependence, cigarettes, uncomplicated: Secondary | ICD-10-CM

## 2017-12-05 DIAGNOSIS — I2510 Atherosclerotic heart disease of native coronary artery without angina pectoris: Secondary | ICD-10-CM

## 2017-12-05 DIAGNOSIS — I10 Essential (primary) hypertension: Secondary | ICD-10-CM | POA: Insufficient documentation

## 2017-12-05 DIAGNOSIS — J849 Interstitial pulmonary disease, unspecified: Secondary | ICD-10-CM

## 2017-12-05 HISTORY — DX: Anxiety disorder, unspecified: F41.9

## 2017-12-05 HISTORY — DX: Dyspnea, unspecified: R06.00

## 2017-12-05 HISTORY — DX: Pulmonary fibrosis, unspecified: J84.10

## 2017-12-05 LAB — BLOOD GAS, ARTERIAL
ACID-BASE EXCESS: 3.5 mmol/L — AB (ref 0.0–2.0)
Bicarbonate: 27 mmol/L (ref 20.0–28.0)
DRAWN BY: 470591
FIO2: 0.21
O2 SAT: 94.8 %
PCO2 ART: 37.2 mmHg (ref 32.0–48.0)
Patient temperature: 98.6
pH, Arterial: 7.473 — ABNORMAL HIGH (ref 7.350–7.450)
pO2, Arterial: 73 mmHg — ABNORMAL LOW (ref 83.0–108.0)

## 2017-12-05 LAB — CBC
HEMATOCRIT: 48.6 % (ref 39.0–52.0)
Hemoglobin: 15.2 g/dL (ref 13.0–17.0)
MCH: 29.3 pg (ref 26.0–34.0)
MCHC: 31.3 g/dL (ref 30.0–36.0)
MCV: 93.6 fL (ref 80.0–100.0)
PLATELETS: 188 10*3/uL (ref 150–400)
RBC: 5.19 MIL/uL (ref 4.22–5.81)
RDW: 13.6 % (ref 11.5–15.5)
WBC: 7.4 10*3/uL (ref 4.0–10.5)
nRBC: 0 % (ref 0.0–0.2)

## 2017-12-05 LAB — COMPREHENSIVE METABOLIC PANEL
ALBUMIN: 3.9 g/dL (ref 3.5–5.0)
ALT: 47 U/L — ABNORMAL HIGH (ref 0–44)
ANION GAP: 10 (ref 5–15)
AST: 40 U/L (ref 15–41)
Alkaline Phosphatase: 69 U/L (ref 38–126)
BILIRUBIN TOTAL: 0.5 mg/dL (ref 0.3–1.2)
BUN: 13 mg/dL (ref 6–20)
CHLORIDE: 106 mmol/L (ref 98–111)
CO2: 24 mmol/L (ref 22–32)
Calcium: 9.2 mg/dL (ref 8.9–10.3)
Creatinine, Ser: 0.69 mg/dL (ref 0.61–1.24)
GFR calc Af Amer: >60 mL/min (ref >60)
GLUCOSE: 103 mg/dL — AB (ref 70–99)
POTASSIUM: 3.7 mmol/L (ref 3.5–5.1)
Sodium: 140 mmol/L (ref 135–145)
TOTAL PROTEIN: 6.9 g/dL (ref 6.5–8.1)

## 2017-12-05 LAB — URINALYSIS, ROUTINE W REFLEX MICROSCOPIC
BILIRUBIN URINE: NEGATIVE
Glucose, UA: NEGATIVE mg/dL
Hgb urine dipstick: NEGATIVE
Ketones, ur: 5 mg/dL — AB
Leukocytes, UA: NEGATIVE
NITRITE: NEGATIVE
PH: 7 (ref 5.0–8.0)
PROTEIN: NEGATIVE mg/dL
Specific Gravity, Urine: 1.026 (ref 1.005–1.030)

## 2017-12-05 LAB — SURGICAL PCR SCREEN
MRSA, PCR: NEGATIVE
Staphylococcus aureus: NEGATIVE

## 2017-12-05 LAB — TYPE AND SCREEN
ABO/RH(D): O POS
ANTIBODY SCREEN: NEGATIVE

## 2017-12-05 LAB — APTT: APTT: 29 s (ref 24–36)

## 2017-12-05 LAB — ABO/RH: ABO/RH(D): O POS

## 2017-12-05 LAB — PROTIME-INR
INR: 0.91
Prothrombin Time: 12.2 seconds (ref 11.4–15.2)

## 2017-12-05 NOTE — Progress Notes (Signed)
Anesthesia Chart Review:  Case:  [id number] Date/Time:  12/06/17 1229   Procedures:      VIDEO ASSISTED THORACOSCOPY (Right Chest)     LUNG BIOPSY (Right )   Anesthesia type:  General   Pre-op diagnosis:  ILD   Location:  [facility] OR ROOM 10 / [place] [facility]   Surgeon:  Melrose Nakayama, MD      DISCUSSION: Patient is a 57 year old male scheduled for the above procedure. He was found to have ILD on recent lung cancer screening CT. Pulmonologist referred to CT surgery for lung biopsy in hopes of getting a definitive diagnosis.   History includes smoking, HTN, hypercholesterolemia, RA, pulmonary fibrosis, dyspnea, anxiety. He had recent cardiology evaluation for dyspnea and coronary calcifications on CT (see below).   Unusual P axis (inverted or biphasic p wave inferior leads, mainly aVF) on pre-operative EKG is more prominent when compared to 10/05/17 EKG at [facility]. No afib noted. HR 57-68 bpm. Recent cardiac testing. If no acute changes then I would anticipate that he can proceed as planned.   VS: BP 137/86   Pulse 68   Temp 36.7 C   Resp 20   Ht 5\' 11"  (1.803 m)   Wt 98.7 kg   SpO2 97%   BMI 30.34 kg/m   PROVIDERS: [name], MD is PCP Kela Millin, MD is cardiologist. Last visit 10/07/17 with Jeri Lager, NP. Six month follow-up recommended.  Leigh Aurora, MD is rheumatologist [facility])  LABS: Labs reviewed: Acceptable for surgery. (all labs ordered are listed, but only abnormal results are displayed)  Labs Reviewed  BLOOD GAS, ARTERIAL - Abnormal; Notable for the following components:      Result Value   pH, Arterial 7.473 (*)    pO2, Arterial 73.0 (*)    Acid-Base Excess 3.5 (*)    All other components within normal limits  COMPREHENSIVE METABOLIC PANEL - Abnormal; Notable for the following components:   Glucose, Bld 103 (*)    ALT 47 (*)    All other components within normal limits  URINALYSIS, ROUTINE W REFLEX MICROSCOPIC - Abnormal;  Notable for the following components:   APPearance HAZY (*)    Ketones, ur 5 (*)    All other components within normal limits  SURGICAL PCR SCREEN  APTT  CBC  PROTIME-INR  TYPE AND SCREEN  ABO/RH    OTHER: PFTs 09/26/17: FVC 4.32 (91%), FEV1 3.03 (84%), FEV1/FVC 70% (92%), FEF 25-75% 1.82 (60%). DLCO unc 30.39 (97%).  Simple office walk 185 feet x  3 laps goal with forehead probe 09/26/2017  11/22/2017   O2 used Room air Room air  Number laps completed 3 3  Comments about pace Normal to mod pace   Resting Pulse Ox/HR 99% and 69/min 100% and 73/min  Final Pulse Ox/HR 99% and 89/min 100% and 97/min  Desaturated </= 88% no no  Desaturated <= 3% points no no  Got Tachycardic >/= 90/min No but almost yes  Symptoms at end of test no x  Miscellaneous comments x x    IMAGES: CXR 12/05/17: PENDING  CT chest (high resolution) 09/06/17: IMPRESSION: 1. Spectrum of findings suggestive of a mild fibrotic interstitial lung disease with slight basilar gradient and no frank honeycombing. 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 No appreciable progression since 02/14/2017 chest CT. 2. One vessel coronary atherosclerosis. 3. Mild diffuse hepatic steatosis. Aortic Atherosclerosis (ICD10-I70.0) and Emphysema (ICD10-J43.9).   EKG: 12/05/17: Unusual P axis, possible ectopic atrial  bradycardia with PACs.    CV: Echo 05/10/17 [facility]): Conclusions: 1.  Left ventricle cavity is normal in size.  Mild concentric hypertrophy of the left ventricle.  Normal global wall motion.  Normal diastolic filling pattern.  Calculated EF 60%. 2.  No significant valvular abnormality.   3.  Inadequate tricuspid regurgitation jet to estimate pulmonary artery pressure.  Normal right atrial pressure.  Nuclear stress test 04/16/17 (ordered by Dr. [name] due to coronary calcifications on CT, done at [facility]): Impression: 1.  The patient performed  treadmill exercise using a Bruce protocol, completing 6:44 minutes.  The patient completed an estimated workload of 8.09 METS, reaching 89% of maximal predicted heart rate.  Normal resting blood pressure with mild exaggerated exercise response, peak BP 210/70 mmHg.  Patient did not develop symptoms other than fatigue during the procedure.  Good exercise capacity.  Stress electrocardiogram showed no ischemic changes. 2.  The overall quality of study is excellent.  There is no evidence of abnormal lung activity.  Stress and rest SPECT images demonstrate homogeneous tracer distribution throughout the myocardium.  Gated SPECT imaging reveals normal myocardial thickening and wall motion.  The left ventricular ejection fraction was normal at 60%.  Low risk study.  Carotid U/S 02/14/17: IMPRESSION: Moderate bilateral carotid atherosclerosis. No hemodynamically significant ICA stenosis. Degree of narrowing less than 50% bilaterally by ultrasound criteria. Patent antegrade vertebral flow bilaterally.   Past Medical History:  Diagnosis Date  . Anxiety   . Arthritis    rheumatoid  . Dyspnea   . Hypercholesteremia   . Hypercholesteremia   . Hypertension   . Pulmonary fibrosis ([place])     Past Surgical History:  Procedure Laterality Date  . JOINT REPLACEMENT     x2 scopes not replacement    MEDICATIONS: . atorvastatin (LIPITOR) 80 MG tablet  . diphenhydrAMINE (BENADRYL) 25 MG tablet  . EPINEPHrine 0.3 mg/0.3 mL IJ SOAJ injection  . gabapentin (NEURONTIN) 100 MG capsule  . HUMIRA PEN 40 MG/0.8ML PNKT  . hydrochlorothiazide (HYDRODIURIL) 12.5 MG tablet  . hydroxychloroquine (PLAQUENIL) 200 MG tablet  . losartan (COZAAR) 100 MG tablet  . naproxen sodium (ANAPROX) 220 MG tablet  . Omega-3 Fatty Acids (FISH OIL PO)  . ranitidine (ZANTAC) 150 MG tablet   No current facility-administered medications for this encounter.     George Hugh [facility] Short Stay Center/Anesthesiology Phone  [phone] 12/05/2017 2:47 PM

## 2017-12-06 ENCOUNTER — Inpatient Hospital Stay (HOSPITAL_COMMUNITY)
Admission: RE | Admit: 2017-12-06 | Discharge: 2017-12-09 | DRG: 168 | Disposition: A | Discharge status: Home / Self Care | Payer: 59 | Source: Ambulatory Visit | Attending: Thoracic Surgery (Cardiothoracic Vascular Surgery) | Admitting: Thoracic Surgery (Cardiothoracic Vascular Surgery)

## 2017-12-06 ENCOUNTER — Inpatient Hospital Stay (HOSPITAL_COMMUNITY): Payer: 59 | Admitting: Certified Registered Nurse Anesthetist

## 2017-12-06 ENCOUNTER — Other Ambulatory Visit: Payer: Self-pay

## 2017-12-06 ENCOUNTER — Inpatient Hospital Stay (HOSPITAL_COMMUNITY): Payer: 59 | Admitting: Vascular Surgery

## 2017-12-06 ENCOUNTER — Encounter (HOSPITAL_COMMUNITY)
Admission: RE | Disposition: A | Discharge status: Home / Self Care | Payer: Self-pay | Source: Ambulatory Visit | Attending: Thoracic Surgery (Cardiothoracic Vascular Surgery)

## 2017-12-06 ENCOUNTER — Inpatient Hospital Stay (HOSPITAL_COMMUNITY): Payer: 59

## 2017-12-06 ENCOUNTER — Encounter (HOSPITAL_COMMUNITY): Payer: Self-pay | Admitting: *Deleted

## 2017-12-06 DIAGNOSIS — J84112 Idiopathic pulmonary fibrosis: Principal | ICD-10-CM | POA: Diagnosis present

## 2017-12-06 DIAGNOSIS — E7800 Pure hypercholesterolemia, unspecified: Secondary | ICD-10-CM | POA: Diagnosis present

## 2017-12-06 DIAGNOSIS — F17210 Nicotine dependence, cigarettes, uncomplicated: Secondary | ICD-10-CM | POA: Diagnosis present

## 2017-12-06 DIAGNOSIS — Z96652 Presence of left artificial knee joint: Secondary | ICD-10-CM | POA: Diagnosis present

## 2017-12-06 DIAGNOSIS — E785 Hyperlipidemia, unspecified: Secondary | ICD-10-CM | POA: Diagnosis present

## 2017-12-06 DIAGNOSIS — Z91030 Bee allergy status: Secondary | ICD-10-CM

## 2017-12-06 DIAGNOSIS — J432 Centrilobular emphysema: Secondary | ICD-10-CM | POA: Diagnosis present

## 2017-12-06 DIAGNOSIS — I700 Atherosclerosis of aorta: Secondary | ICD-10-CM | POA: Diagnosis present

## 2017-12-06 DIAGNOSIS — Z91038 Other insect allergy status: Secondary | ICD-10-CM | POA: Diagnosis not present

## 2017-12-06 DIAGNOSIS — M47814 Spondylosis without myelopathy or radiculopathy, thoracic region: Secondary | ICD-10-CM | POA: Diagnosis present

## 2017-12-06 DIAGNOSIS — K760 Fatty (change of) liver, not elsewhere classified: Secondary | ICD-10-CM | POA: Diagnosis present

## 2017-12-06 DIAGNOSIS — J849 Interstitial pulmonary disease, unspecified: Secondary | ICD-10-CM | POA: Diagnosis not present

## 2017-12-06 DIAGNOSIS — Z4682 Encounter for fitting and adjustment of non-vascular catheter: Secondary | ICD-10-CM

## 2017-12-06 DIAGNOSIS — M069 Rheumatoid arthritis, unspecified: Secondary | ICD-10-CM | POA: Diagnosis present

## 2017-12-06 DIAGNOSIS — I10 Essential (primary) hypertension: Secondary | ICD-10-CM | POA: Diagnosis present

## 2017-12-06 DIAGNOSIS — Z09 Encounter for follow-up examination after completed treatment for conditions other than malignant neoplasm: Secondary | ICD-10-CM

## 2017-12-06 DIAGNOSIS — I2510 Atherosclerotic heart disease of native coronary artery without angina pectoris: Secondary | ICD-10-CM | POA: Diagnosis present

## 2017-12-06 HISTORY — PX: LUNG BIOPSY: SHX5088

## 2017-12-06 HISTORY — PX: VIDEO ASSISTED THORACOSCOPY: SHX5073

## 2017-12-06 SURGERY — VIDEO ASSISTED THORACOSCOPY
Anesthesia: General | Site: Chest | Laterality: Right

## 2017-12-06 MED ORDER — FENTANYL CITRATE (PF) 250 MCG/5ML IJ SOLN
INTRAMUSCULAR | Status: AC
  Filled 2017-12-06: qty 5

## 2017-12-06 MED ORDER — ONDANSETRON HCL 4 MG/2ML IJ SOLN: 4.0000 mg | Freq: Once | INTRAMUSCULAR | Status: DC | PRN

## 2017-12-06 MED ORDER — ACETAMINOPHEN 500 MG PO TABS
1000.0000 mg | ORAL_TABLET | Freq: Four times a day (QID) | ORAL | Status: DC
  Administered 2017-12-06 – 2017-12-09 (×8): 1000 mg via ORAL
  Filled 2017-12-06 (×8): qty 2

## 2017-12-06 MED ORDER — MEPERIDINE HCL 50 MG/ML IJ SOLN: 6.2500 mg | INTRAMUSCULAR | Status: DC | PRN

## 2017-12-06 MED ORDER — PANTOPRAZOLE SODIUM 40 MG PO TBEC
40.0000 mg | DELAYED_RELEASE_TABLET | Freq: Every day | ORAL | Status: DC
  Administered 2017-12-06 – 2017-12-09 (×4): 40 mg via ORAL
  Filled 2017-12-06 (×4): qty 1

## 2017-12-06 MED ORDER — MIDAZOLAM HCL 2 MG/2ML IJ SOLN
INTRAMUSCULAR | Status: AC
  Filled 2017-12-06: qty 2

## 2017-12-06 MED ORDER — LACTATED RINGERS IV SOLN
INTRAVENOUS | Status: DC
  Administered 2017-12-06: 11:00:00 via INTRAVENOUS

## 2017-12-06 MED ORDER — ONDANSETRON HCL 4 MG/2ML IJ SOLN: 4.0000 mg | Freq: Four times a day (QID) | INTRAMUSCULAR | Status: DC | PRN

## 2017-12-06 MED ORDER — LACTATED RINGERS IV SOLN
INTRAVENOUS | Status: DC | PRN
  Administered 2017-12-06: 11:00:00 via INTRAVENOUS

## 2017-12-06 MED ORDER — TRAMADOL HCL 50 MG PO TABS
50.0000 mg | ORAL_TABLET | Freq: Four times a day (QID) | ORAL | Status: DC | PRN
  Administered 2017-12-07 – 2017-12-09 (×3): 100 mg via ORAL
  Filled 2017-12-06 (×3): qty 2

## 2017-12-06 MED ORDER — BISACODYL 5 MG PO TBEC
10.0000 mg | DELAYED_RELEASE_TABLET | Freq: Every day | ORAL | Status: DC
  Administered 2017-12-08 – 2017-12-09 (×2): 10 mg via ORAL
  Filled 2017-12-06 (×4): qty 2

## 2017-12-06 MED ORDER — FENTANYL CITRATE (PF) 100 MCG/2ML IJ SOLN
INTRAMUSCULAR | Status: AC
  Filled 2017-12-06: qty 2

## 2017-12-06 MED ORDER — LIDOCAINE 2% (20 MG/ML) 5 ML SYRINGE
INTRAMUSCULAR | Status: DC | PRN
  Administered 2017-12-06: 80 mg via INTRAVENOUS

## 2017-12-06 MED ORDER — GABAPENTIN 100 MG PO CAPS
100.0000 mg | ORAL_CAPSULE | Freq: Every day | ORAL | Status: DC
  Administered 2017-12-06 – 2017-12-08 (×3): 100 mg via ORAL
  Filled 2017-12-06 (×3): qty 1

## 2017-12-06 MED ORDER — HYDROMORPHONE HCL 1 MG/ML IJ SOLN
0.2500 mg | INTRAMUSCULAR | Status: DC | PRN
  Administered 2017-12-06 (×4): 0.5 mg via INTRAVENOUS

## 2017-12-06 MED ORDER — ATORVASTATIN CALCIUM 80 MG PO TABS
80.0000 mg | ORAL_TABLET | Freq: Every day | ORAL | Status: DC
  Administered 2017-12-07 – 2017-12-09 (×3): 80 mg via ORAL
  Filled 2017-12-06 (×3): qty 1

## 2017-12-06 MED ORDER — NALOXONE HCL 0.4 MG/ML IJ SOLN: 0.4000 mg | INTRAMUSCULAR | Status: DC | PRN

## 2017-12-06 MED ORDER — SUGAMMADEX SODIUM 200 MG/2ML IV SOLN
INTRAVENOUS | Status: DC | PRN
  Administered 2017-12-06: 200 mg via INTRAVENOUS

## 2017-12-06 MED ORDER — ACETAMINOPHEN 160 MG/5ML PO SOLN
1000.0000 mg | Freq: Four times a day (QID) | ORAL | Status: DC
  Filled 2017-12-06: qty 40.6

## 2017-12-06 MED ORDER — CEFAZOLIN SODIUM-DEXTROSE 2-4 GM/100ML-% IV SOLN
INTRAVENOUS | Status: AC
  Filled 2017-12-06: qty 100

## 2017-12-06 MED ORDER — FENTANYL CITRATE (PF) 100 MCG/2ML IJ SOLN
INTRAMUSCULAR | Status: DC | PRN
  Administered 2017-12-06: 50 ug via INTRAVENOUS
  Administered 2017-12-06: 150 ug via INTRAVENOUS
  Administered 2017-12-06: 100 ug via INTRAVENOUS

## 2017-12-06 MED ORDER — HYDROMORPHONE HCL 1 MG/ML IJ SOLN
INTRAMUSCULAR | Status: AC
  Administered 2017-12-06: 0.5 mg via INTRAVENOUS
  Filled 2017-12-06: qty 1

## 2017-12-06 MED ORDER — PROPOFOL 10 MG/ML IV BOLUS
INTRAVENOUS | Status: AC
  Filled 2017-12-06: qty 20

## 2017-12-06 MED ORDER — POTASSIUM CHLORIDE 10 MEQ/50ML IV SOLN: 10.0000 meq | Freq: Every day | INTRAVENOUS | Status: DC | PRN

## 2017-12-06 MED ORDER — ROCURONIUM BROMIDE 10 MG/ML (PF) SYRINGE
PREFILLED_SYRINGE | INTRAVENOUS | Status: DC | PRN
  Administered 2017-12-06: 50 mg via INTRAVENOUS
  Administered 2017-12-06: 10 mg via INTRAVENOUS

## 2017-12-06 MED ORDER — ALBUTEROL SULFATE (2.5 MG/3ML) 0.083% IN NEBU
2.5000 mg | INHALATION_SOLUTION | RESPIRATORY_TRACT | Status: DC
  Administered 2017-12-06: 2.5 mg via RESPIRATORY_TRACT
  Filled 2017-12-06: qty 3

## 2017-12-06 MED ORDER — CEFAZOLIN SODIUM-DEXTROSE 2-4 GM/100ML-% IV SOLN
2.0000 g | Freq: Three times a day (TID) | INTRAVENOUS | Status: AC
  Administered 2017-12-06 – 2017-12-07 (×2): 2 g via INTRAVENOUS
  Filled 2017-12-06 (×2): qty 100

## 2017-12-06 MED ORDER — ONDANSETRON HCL 4 MG/2ML IJ SOLN
INTRAMUSCULAR | Status: DC | PRN
  Administered 2017-12-06: 4 mg via INTRAVENOUS

## 2017-12-06 MED ORDER — DIPHENHYDRAMINE HCL 50 MG/ML IJ SOLN: 12.5000 mg | Freq: Four times a day (QID) | INTRAMUSCULAR | Status: DC | PRN

## 2017-12-06 MED ORDER — BUPIVACAINE HCL (PF) 0.5 % IJ SOLN
INTRAMUSCULAR | Status: AC
  Filled 2017-12-06: qty 30

## 2017-12-06 MED ORDER — SENNOSIDES-DOCUSATE SODIUM 8.6-50 MG PO TABS
1.0000 | ORAL_TABLET | Freq: Every day | ORAL | Status: DC
  Administered 2017-12-06 – 2017-12-08 (×3): 1 via ORAL
  Filled 2017-12-06 (×3): qty 1

## 2017-12-06 MED ORDER — PROPOFOL 10 MG/ML IV BOLUS
INTRAVENOUS | Status: DC | PRN
  Administered 2017-12-06: 40 mg via INTRAVENOUS
  Administered 2017-12-06: 120 mg via INTRAVENOUS

## 2017-12-06 MED ORDER — HYDROCHLOROTHIAZIDE 12.5 MG PO CAPS
12.5000 mg | ORAL_CAPSULE | Freq: Every day | ORAL | Status: DC
  Administered 2017-12-07 – 2017-12-09 (×3): 12.5 mg via ORAL
  Filled 2017-12-06 (×3): qty 1

## 2017-12-06 MED ORDER — SODIUM CHLORIDE 0.9% FLUSH: 9.0000 mL | INTRAVENOUS | Status: DC | PRN

## 2017-12-06 MED ORDER — LOSARTAN POTASSIUM 50 MG PO TABS
100.0000 mg | ORAL_TABLET | Freq: Every day | ORAL | Status: DC
  Administered 2017-12-07 – 2017-12-09 (×2): 100 mg via ORAL
  Filled 2017-12-06 (×3): qty 2

## 2017-12-06 MED ORDER — HYDROXYCHLOROQUINE SULFATE 200 MG PO TABS: 200.0000 mg | ORAL_TABLET | Freq: Every day | ORAL | Status: DC

## 2017-12-06 MED ORDER — FENTANYL 40 MCG/ML IV SOLN
INTRAVENOUS | Status: DC
  Administered 2017-12-06: 45 ug via INTRAVENOUS
  Administered 2017-12-06: 18:00:00 via INTRAVENOUS
  Administered 2017-12-06: 105 ug via INTRAVENOUS
  Administered 2017-12-07: 150 ug via INTRAVENOUS
  Administered 2017-12-07 (×2): 75 ug via INTRAVENOUS
  Administered 2017-12-07: 126.9 ug via INTRAVENOUS
  Administered 2017-12-07: 90 ug via INTRAVENOUS
  Administered 2017-12-08: 1000 ug via INTRAVENOUS
  Administered 2017-12-08: 75 ug via INTRAVENOUS
  Administered 2017-12-08: 105 ug via INTRAVENOUS
  Administered 2017-12-08: 90 ug via INTRAVENOUS
  Filled 2017-12-06: qty 1000
  Filled 2017-12-06: qty 25

## 2017-12-06 MED ORDER — DEXAMETHASONE SODIUM PHOSPHATE 10 MG/ML IJ SOLN
INTRAMUSCULAR | Status: DC | PRN
  Administered 2017-12-06: 10 mg via INTRAVENOUS

## 2017-12-06 MED ORDER — MIDAZOLAM HCL 5 MG/5ML IJ SOLN
INTRAMUSCULAR | Status: DC | PRN
  Administered 2017-12-06: 2 mg via INTRAVENOUS

## 2017-12-06 MED ORDER — DIPHENHYDRAMINE HCL 12.5 MG/5ML PO ELIX
12.5000 mg | ORAL_SOLUTION | Freq: Four times a day (QID) | ORAL | Status: DC | PRN
  Filled 2017-12-06: qty 5

## 2017-12-06 MED ORDER — BUPIVACAINE LIPOSOME 1.3 % IJ SUSP
INTRAMUSCULAR | Status: DC | PRN
  Administered 2017-12-06: 75 mL

## 2017-12-06 MED ORDER — ACETAMINOPHEN 10 MG/ML IV SOLN
INTRAVENOUS | Status: AC
  Filled 2017-12-06: qty 100

## 2017-12-06 MED ORDER — CEFAZOLIN SODIUM-DEXTROSE 2-4 GM/100ML-% IV SOLN
2.0000 g | INTRAVENOUS | Status: AC
  Administered 2017-12-06: 2 g via INTRAVENOUS

## 2017-12-06 MED ORDER — BUPIVACAINE LIPOSOME 1.3 % IJ SUSP
20.0000 mL | Freq: Once | INTRAMUSCULAR | Status: DC
  Filled 2017-12-06: qty 20

## 2017-12-06 MED ORDER — ENOXAPARIN SODIUM 40 MG/0.4ML ~~LOC~~ SOLN
40.0000 mg | SUBCUTANEOUS | Status: DC
  Administered 2017-12-07 – 2017-12-09 (×3): 40 mg via SUBCUTANEOUS
  Filled 2017-12-06 (×3): qty 0.4

## 2017-12-06 MED ORDER — DEXTROSE-NACL 5-0.9 % IV SOLN
INTRAVENOUS | Status: DC
  Administered 2017-12-06: via INTRAVENOUS

## 2017-12-06 SURGICAL SUPPLY — 79 items
ADH SKN CLS APL DERMABOND .7 (GAUZE/BANDAGES/DRESSINGS) ×1
APL SWBSTK 6 STRL LF DISP (MISCELLANEOUS)
APPLICATOR COTTON TIP 6 STRL (MISCELLANEOUS) IMPLANT
APPLICATOR COTTON TIP 6IN STRL (MISCELLANEOUS)
APPLIER CLIP ROT 10 11.4 M/L (STAPLE)
APR CLP MED LRG 11.4X10 (STAPLE)
BAG SPEC RTRVL LRG 6X4 10 (ENDOMECHANICALS)
CANISTER SUCT 3000ML PPV (MISCELLANEOUS) ×2 IMPLANT
CATH THORACIC 28FR (CATHETERS) ×2 IMPLANT
CATH THORACIC 28FR RT ANG (CATHETERS) IMPLANT
CATH THORACIC 36FR (CATHETERS) IMPLANT
CATH THORACIC 36FR RT ANG (CATHETERS) IMPLANT
CLIP APPLIE ROT 10 11.4 M/L (STAPLE) IMPLANT
CLIP VESOCCLUDE MED 6/CT (CLIP) IMPLANT
CONN Y 3/8X3/8X3/8  BEN (MISCELLANEOUS) ×1
CONN Y 3/8X3/8X3/8 BEN (MISCELLANEOUS) ×1 IMPLANT
CONT SPEC 4OZ CLIKSEAL STRL BL (MISCELLANEOUS) ×12 IMPLANT
COVER SURGICAL LIGHT HANDLE (MISCELLANEOUS) ×2 IMPLANT
COVER WAND RF STERILE (DRAPES) ×2 IMPLANT
DERMABOND ADVANCED (GAUZE/BANDAGES/DRESSINGS) ×1
DERMABOND ADVANCED .7 DNX12 (GAUZE/BANDAGES/DRESSINGS) ×1 IMPLANT
DRAIN CHANNEL 28F RND 3/8 FF (WOUND CARE) IMPLANT
DRAIN CHANNEL 32F RND 10.7 FF (WOUND CARE) IMPLANT
DRAPE CV SPLIT W-CLR ANES SCRN (DRAPES) ×2 IMPLANT
DRAPE LAPAROSCOPIC ABDOMINAL (DRAPES) ×2 IMPLANT
DRAPE ORTHO SPLIT 77X108 STRL (DRAPES) ×2
DRAPE SLUSH/WARMER DISC (DRAPES) ×2 IMPLANT
DRAPE SURG ORHT 6 SPLT 77X108 (DRAPES) ×1 IMPLANT
ELECT REM PT RETURN 9FT ADLT (ELECTROSURGICAL) ×2
ELECTRODE REM PT RTRN 9FT ADLT (ELECTROSURGICAL) ×1 IMPLANT
GAUZE SPONGE 4X4 12PLY STRL (GAUZE/BANDAGES/DRESSINGS) ×2 IMPLANT
GAUZE SPONGE 4X4 12PLY STRL LF (GAUZE/BANDAGES/DRESSINGS) ×2 IMPLANT
GLOVE SURG SIGNA 7.5 PF LTX (GLOVE) ×4 IMPLANT
GOWN STRL REUS W/ TWL LRG LVL3 (GOWN DISPOSABLE) ×2 IMPLANT
GOWN STRL REUS W/ TWL XL LVL3 (GOWN DISPOSABLE) ×1 IMPLANT
GOWN STRL REUS W/TWL LRG LVL3 (GOWN DISPOSABLE) ×4
GOWN STRL REUS W/TWL XL LVL3 (GOWN DISPOSABLE) ×2
HEMOSTAT SURGICEL 2X14 (HEMOSTASIS) IMPLANT
IV CATH 22GX1 FEP (IV SOLUTION) IMPLANT
KIT BASIN OR (CUSTOM PROCEDURE TRAY) ×2 IMPLANT
KIT TURNOVER KIT B (KITS) ×2 IMPLANT
NS IRRIG 1000ML POUR BTL (IV SOLUTION) ×4 IMPLANT
PACK CHEST (CUSTOM PROCEDURE TRAY) ×2 IMPLANT
PAD ARMBOARD 7.5X6 YLW CONV (MISCELLANEOUS) ×4 IMPLANT
POUCH ENDO CATCH II 15MM (MISCELLANEOUS) IMPLANT
POUCH SPECIMEN RETRIEVAL 10MM (ENDOMECHANICALS) IMPLANT
RELOAD STAPLER GOLD 60MM (STAPLE) ×5 IMPLANT
SEALANT PROGEL (MISCELLANEOUS) IMPLANT
SEALANT SURG COSEAL 4ML (VASCULAR PRODUCTS) IMPLANT
SEALANT SURG COSEAL 8ML (VASCULAR PRODUCTS) IMPLANT
SOLUTION ANTI FOG 6CC (MISCELLANEOUS) ×2 IMPLANT
SPONGE INTESTINAL PEANUT (DISPOSABLE) IMPLANT
SPONGE TONSIL TAPE 1 RFD (DISPOSABLE) ×2 IMPLANT
STAPLE ECHEON FLEX 60 POW ENDO (STAPLE) ×2 IMPLANT
STAPLER RELOAD GOLD 60MM (STAPLE) ×10
SUT PROLENE 4 0 RB 1 (SUTURE)
SUT PROLENE 4-0 RB1 .5 CRCL 36 (SUTURE) IMPLANT
SUT SILK  1 MH (SUTURE) ×1
SUT SILK 1 MH (SUTURE) ×1 IMPLANT
SUT SILK 2 0SH CR/8 30 (SUTURE) IMPLANT
SUT SILK 3 0SH CR/8 30 (SUTURE) IMPLANT
SUT VIC AB 1 CTX 36 (SUTURE) ×2
SUT VIC AB 1 CTX36XBRD ANBCTR (SUTURE) ×1 IMPLANT
SUT VIC AB 2-0 CTX 36 (SUTURE) ×2 IMPLANT
SUT VIC AB 2-0 UR6 27 (SUTURE) IMPLANT
SUT VIC AB 3-0 MH 27 (SUTURE) IMPLANT
SUT VIC AB 3-0 X1 27 (SUTURE) ×2 IMPLANT
SUT VICRYL 2 TP 1 (SUTURE) IMPLANT
SYR 20CC LL (SYRINGE) IMPLANT
SYSTEM SAHARA CHEST DRAIN ATS (WOUND CARE) ×2 IMPLANT
TAPE CLOTH 4X10 WHT NS (GAUZE/BANDAGES/DRESSINGS) ×2 IMPLANT
TAPE CLOTH SURG 4X10 WHT LF (GAUZE/BANDAGES/DRESSINGS) ×2 IMPLANT
TIP APPLICATOR SPRAY EXTEND 16 (VASCULAR PRODUCTS) IMPLANT
TOWEL GREEN STERILE (TOWEL DISPOSABLE) ×2 IMPLANT
TOWEL GREEN STERILE FF (TOWEL DISPOSABLE) ×2 IMPLANT
TRAY FOLEY MTR SLVR 16FR STAT (SET/KITS/TRAYS/PACK) ×2 IMPLANT
TROCAR XCEL BLADELESS 5X75MML (TROCAR) ×2 IMPLANT
TROCAR XCEL NON-BLD 5MMX100MML (ENDOMECHANICALS) IMPLANT
WATER STERILE IRR 1000ML POUR (IV SOLUTION) ×2 IMPLANT

## 2017-12-06 NOTE — Anesthesia Preprocedure Evaluation (Signed)
Anesthesia Evaluation  Patient identified by MRN, date of birth, ID band Patient awake    Reviewed: Allergy & Precautions, NPO status , Patient's Chart, lab work & pertinent test results  Airway Mallampati: I  TM Distance: >3 FB Neck ROM: Full    Dental   Pulmonary Current Smoker,    Pulmonary exam normal        Cardiovascular hypertension, Pt. on medications Normal cardiovascular exam     Neuro/Psych Anxiety    GI/Hepatic   Endo/Other    Renal/GU      Musculoskeletal   Abdominal   Peds  Hematology   Anesthesia Other Findings   Reproductive/Obstetrics                             Anesthesia Physical Anesthesia Plan  ASA: III  Anesthesia Plan: General   Post-op Pain Management:    Induction: Intravenous  PONV Risk Score and Plan: 1 and Ondansetron and Treatment may vary due to age or medical condition  Airway Management Planned: Double Lumen EBT  Additional Equipment: Arterial line and CVP  Intra-op Plan:   Post-operative Plan: Extubation in OR  Informed Consent: I have reviewed the patients History and Physical, chart, labs and discussed the procedure including the risks, benefits and alternatives for the proposed anesthesia with the patient or authorized representative who has indicated his/her understanding and acceptance.     Plan Discussed with: CRNA and Surgeon  Anesthesia Plan Comments:         Anesthesia Quick Evaluation

## 2017-12-06 NOTE — Brief Op Note (Addendum)
12/06/2017  4:48 PM  PATIENT:  Robert Greene  57 y.o. male  PRE-OPERATIVE DIAGNOSIS:  ILD  POST-OPERATIVE DIAGNOSIS:  ILD  PROCEDURE:  Procedure(s): VIDEO ASSISTED THORACOSCOPY (Right) LUNG BIOPSY (Right)  INTERCOSTAL NERVE BLOCKS- 3-9  SURGEON:  Surgeon(s) and Role:    * Melrose Nakayama, MD - Primary  PHYSICIAN ASSISTANT: WAYNE GOLD PA-C  ANESTHESIA:   general  EBL:  100 mL   BLOOD ADMINISTERED:none  DRAINS: 1 28 F Chest Tube(s) in the RIGHT HEMITHORAX   LOCAL MEDICATIONS USED:  BUPIVICAINE   SPECIMEN:  Source of Specimen:  RUL/RML/RLL WEDGE RESECTIONS  DISPOSITION OF SPECIMEN:  PATHOLOGY  COUNTS:  YES  TOURNIQUET:  * No tourniquets in log *  DICTATION: .Other Dictation: Dictation Number PENDING  PLAN OF CARE: Admit to inpatient   PATIENT DISPOSITION:  PACU - hemodynamically stable.   Delay start of Pharmacological VTE agent (>24hrs) due to surgical blood loss or risk of bleeding: yes  COMPLICATIONS: NO KNOWN

## 2017-12-06 NOTE — Anesthesia Postprocedure Evaluation (Signed)
Anesthesia Post Note  [name]  Procedure(s) Performed: VIDEO ASSISTED THORACOSCOPY (Right Chest) LUNG BIOPSY (Right Chest)     Patient location during evaluation: PACU Anesthesia Type: General Level of consciousness: awake and alert Pain management: pain level controlled Vital Signs Assessment: post-procedure vital signs reviewed and stable Respiratory status: spontaneous breathing, nonlabored ventilation, respiratory function stable and patient connected to nasal cannula oxygen Cardiovascular status: blood pressure returned to baseline and stable Postop Assessment: no apparent nausea or vomiting Anesthetic complications: no    Last Vitals:  Vitals:   12/06/17 1805 12/06/17 1931  BP:    Pulse:    Resp:    Temp: 36.5 C   SpO2:  97%    Last Pain:  Vitals:   12/06/17 1805  TempSrc:   PainSc: 4                  Ziyon Soltau DAVID

## 2017-12-06 NOTE — H&P (Signed)
PCP is [name], MD Referring Provider is Brand Males, MD      Chief Complaint  Patient presents with  . Interstitial Lung Disease    new patient consultation Chest CT 09/06/17, PFTs 09/26/17    HPI: Robert Greene is a 57 year old gentleman with a history of tobacco abuse, hypertension, hyperlipidemia, and rheumatoid arthritis.  He smoked about 1-1/4 packs of cigarettes for 40 years.  He saw Dr. Rex Kras back in January.  He recommended a low-dose screening CT for lung cancer.  There were no suspicious lung nodules, but the CT did show evidence of interstitial lung disease.  He was referred to Dr. [name].  He recently had a follow-up CT which again showed interstitial lung disease without significant honeycombing.  He does have some mild exertional shortness of breath.  He is currently in the process of applying for disability.  He is able to walk up and down stairs and climb ladders.  He has been trying to quit smoking.  He is not having any chest pain, pressure, or tightness at rest or with exertion. Zubrod Score: At the time of surgery this patient's most appropriate activity status/level should be described as: []     0    Normal activity, no symptoms [x]     1    Restricted in physical strenuous activity but ambulatory, able to do out light work []     2    Ambulatory and capable of self care, unable to do work activities, up and about >50 % of waking hours                              []     3    Only limited self care, in bed greater than 50% of waking hours []     4    Completely disabled, no self care, confined to bed or chair []     5    Moribund      Past Medical History:  Diagnosis Date  . Arthritis    rheumatoid  . Hypercholesteremia   . Hypercholesteremia   . Hypertension          Past Surgical History:  Procedure Laterality Date  . KNEE ARTHROPLASTY Left     No family history on file.  Social History Social History        Tobacco Use   . Smoking status: Current Every Day Smoker    Packs/day: 1.25    Years: 40.00    Pack years: 50.00    Types: Cigarettes  . Smokeless tobacco: Never Used  . Tobacco comment: currently smoking 0.5ppd as of 09/26/17 ep  Substance Use Topics  . Alcohol use: Yes    Alcohol/week: 1.0 standard drinks    Types: 1 Standard drinks or equivalent per week  . Drug use: No          Current Outpatient Medications  Medication Sig Dispense Refill  . Adalimumab (HUMIRA) 40 MG/0.4ML PSKT Inject 40 mg into the skin every 14 (fourteen) days.     Marland Kitchen atorvastatin (LIPITOR) 80 MG tablet Take 80 mg by mouth daily.  1  . diphenhydrAMINE (BENADRYL) 25 MG tablet Take 25 mg by mouth every 4 (four) hours as needed for itching.    Marland Kitchen EPINEPHrine 0.3 mg/0.3 mL IJ SOAJ injection Use once.  If ineffective, use 2nd dose. 2 Device 0  . gabapentin (NEURONTIN) 100 MG capsule     . hydroxychloroquine (PLAQUENIL) 200  MG tablet Patient been taking 1 a day  2  . hydrOXYzine (ATARAX/VISTARIL) 25 MG tablet Take 1 tablet (25 mg total) by mouth every 6 (six) hours. 12 tablet 0  . lisinopril (PRINIVIL,ZESTRIL) 20 MG tablet Take 20 mg by mouth daily.    . naproxen sodium (ANAPROX) 220 MG tablet Take 220 mg by mouth 2 (two) times daily with a meal.    . Omega-3 Fatty Acids (FISH OIL PO) Take 2 capsules by mouth 2 (two) times daily.     . ranitidine (ZANTAC) 150 MG tablet Take 150 mg by mouth 2 (two) times daily.     No current facility-administered medications for this visit.         Allergies  Allergen Reactions  . Bee Venom Anaphylaxis, Hives and Rash  . Spider Antivenin [Black Widow Spider Antivenin (L.Mactans)] Anaphylaxis, Hives and Rash    Review of Systems  Constitutional: Negative for activity change, chills, fever and unexpected weight change.  HENT: Negative for trouble swallowing and voice change.   Eyes: Negative for visual disturbance.  Respiratory: Positive for shortness of  breath. Negative for cough and wheezing.   Cardiovascular: Negative for chest pain, palpitations and leg swelling.  Gastrointestinal: Positive for abdominal pain (Frequent heartburn). Negative for abdominal distention.  Genitourinary: Negative for difficulty urinating and dysuria.  Musculoskeletal: Positive for arthralgias and joint swelling. Negative for gait problem.  Neurological: Negative for seizures, syncope and weakness.  Hematological: Negative for adenopathy. Does not bruise/bleed easily.  All other systems reviewed and are negative.   BP 130/84 (BP Location: Left Arm, Patient Position: Sitting, Cuff Size: Normal)   Pulse 64   Resp 18   Ht 5' 9.25" (1.759 m)   Wt 212 lb 6.4 oz (96.3 kg)   SpO2 98% Comment: RA  BMI 31.14 kg/m  Physical Exam  Constitutional: He is oriented to person, place, and time. He appears well-developed and well-nourished. No distress.  HENT:  Head: Normocephalic and atraumatic.  Mouth/Throat: No oropharyngeal exudate.  Eyes: Pupils are equal, round, and reactive to light. Conjunctivae and EOM are normal. No scleral icterus.  Neck: No thyromegaly present.  Cardiovascular: Normal rate, regular rhythm and normal heart sounds. Exam reveals no gallop and no friction rub.  No murmur heard. Pulmonary/Chest: Effort normal and breath sounds normal. No respiratory distress. He has no wheezes. He has no rales.  Abdominal: Soft. He exhibits no distension. There is no tenderness.  Musculoskeletal: He exhibits deformity (Mild joint swelling hands). He exhibits no edema.  Lymphadenopathy:    He has no cervical adenopathy.  Neurological: He is alert and oriented to person, place, and time. No cranial nerve deficit. He exhibits normal muscle tone. Coordination normal.  Skin: Skin is warm and dry.  Vitals reviewed.    Diagnostic Tests: CT CHEST WITHOUT CONTRAST  TECHNIQUE: Multidetector CT imaging of the chest was performed following the standard protocol  without intravenous contrast. High resolution imaging of the lungs, as well as inspiratory and expiratory imaging, was performed.  COMPARISON: 03/22/2017 high-resolution chest CT.  FINDINGS: Cardiovascular: Normal heart size. No significant pericardial effusion/thickening. Left anterior descending coronary atherosclerosis. Atherosclerotic nonaneurysmal thoracic aorta. Normal caliber pulmonary arteries.  Mediastinum/Nodes: No discrete thyroid nodules. Unremarkable esophagus. No pathologically enlarged axillary, mediastinal or hilar lymph nodes, noting limited sensitivity for the detection of hilar adenopathy on this noncontrast study.  Lungs/Pleura: No pneumothorax. No pleural effusion. Mild centrilobular and paraseptal emphysema with diffuse bronchial wall thickening and saber sheath trachea. No acute consolidative airspace  disease, lung masses or significant pulmonary nodules. There is patchy subpleural reticulation and ground-glass attenuation in both lungs with associated minimal traction bronchiolectasis. No frank honeycombing. No significant air trapping on the expiration sequence. Slight basilar gradient to these findings. Findings asymmetrically involve the right lung. No appreciable progression since 02/14/2017 chest CT.  Upper abdomen: Mild diffuse hepatic steatosis.  Musculoskeletal: No aggressive appearing focal osseous lesions. Mild thoracic spondylosis.  IMPRESSION: 1. Spectrum of findings suggestive of a mild fibrotic interstitial lung disease with slight basilar gradient and no frank honeycombing. 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 No appreciable progression since 02/14/2017 chest CT. 2. One vessel coronary atherosclerosis. 3. Mild diffuse hepatic steatosis.  Aortic Atherosclerosis (ICD10-I70.0) and Emphysema (ICD10-J43.9).   Electronically Signed By: Ilona Sorrel M.D. On: 09/06/2017 10:53 I personally  reviewed the CT images and concur with the findings noted above  Impression: Robert Greene is a 57 year old gentleman with a history of tobacco abuse, hypertension, hyperlipidemia, and rheumatoid arthritis.  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  He has some exposure to chickens and pigeons which his brother owns, that has been rather minor.  He had a low-dose screening CT for lung cancer due to his smoking history back in January.  He was found to have interstitial lung disease.  High-resolution CT there is mild fibrotic interstitial lung disease without frank honeycombing.  Dr. [name] feels that a lung biopsy will be useful in providing a definitive diagnosis that we will give prognostic information and help guide therapy.  I discussed the procedure with him.  We will plan to do a right VATS for lung biopsy.  I informed him of the general nature of the procedure, the need for general anesthesia, the incisions to be used, the use of a drainage tube postoperatively, the expected hospital stay, and the overall recovery.  He understands this is diagnostic and not therapeutic.  I informed him of the indications, risk, benefits, and alternatives.  He understands the risks include, but are not limited to death, MI, DVT, PE, bleeding, possible need for transfusion, infection, prolonged air leak, cardiac arrhythmias, as well as possibility of other unforeseeable complications.  He thinks he wants to proceed with the biopsy, but wants to think about it before setting a date for the procedure.  He is trying to quit smoking and wants to get further along with that process before having the procedure done.  He also is waiting on some disability paperwork.  He thinks he may want to do the procedure in mid or late October.  Plan: Right VATS for lung biopsy for interstitial lung disease.  Patient will call to schedule.  Melrose Nakayama, MD [facility] [phone]        Electronically signed by Melrose Nakayama, MD at 10/15/2017 4:58 PM

## 2017-12-06 NOTE — Anesthesia Procedure Notes (Signed)
Procedure Name: Intubation Date/Time: 12/06/2017 3:45 PM Performed by: Myna Bright, CRNA Pre-anesthesia Checklist: Patient identified, Emergency Drugs available, Suction available and Patient being monitored Patient Re-evaluated:Patient Re-evaluated prior to induction Oxygen Delivery Method: Circle system utilized Preoxygenation: Pre-oxygenation with 100% oxygen Induction Type: IV induction Ventilation: Mask ventilation without difficulty and Oral airway inserted - appropriate to patient size Laryngoscope Size: 4 and Mac Grade View: Grade I Endobronchial tube: Left, Double lumen EBT, EBT position confirmed by auscultation and EBT position confirmed by fiberoptic bronchoscope and 39 Fr Number of attempts: 1 Airway Equipment and Method: Stylet Placement Confirmation: ETT inserted through vocal cords under direct vision,  positive ETCO2 and breath sounds checked- equal and bilateral Tube secured with: Tape Dental Injury: Teeth and Oropharynx as per pre-operative assessment

## 2017-12-06 NOTE — Anesthesia Procedure Notes (Signed)
Arterial Line Insertion Start/End11/14/2019 11:35 AM, 12/06/2017 11:43 AM Performed by: Cleda Daub, CRNA, CRNA  Patient location: Pre-op. Preanesthetic checklist: patient identified, IV checked, site marked, risks and benefits discussed, surgical consent, monitors and equipment checked, pre-op evaluation and anesthesia consent Right, radial was placed Catheter size: 20 G Hand hygiene performed , maximum sterile barriers used  and Seldinger technique used Allen's test indicative of satisfactory collateral circulation Attempts: 2 (1st attempt by Neldon Newport.) Procedure performed without using ultrasound guided technique. Ultrasound Notes:anatomy identified, needle tip was noted to be adjacent to the nerve/plexus identified and no ultrasound evidence of intravascular and/or intraneural injection Following insertion, dressing applied and Biopatch. Post procedure assessment: normal  Patient tolerated the procedure well with no immediate complications.

## 2017-12-06 NOTE — Interval H&P Note (Signed)
History and Physical Interval Note: No interval change 12/06/2017 3:45 PM  [name]  has presented today for surgery, with the diagnosis of ILD  The various methods of treatment have been discussed with the patient and family. After consideration of risks, benefits and other options for treatment, the patient has consented to  Procedure(s): VIDEO ASSISTED THORACOSCOPY (Right) LUNG BIOPSY (Right) as a surgical intervention .  The patient's history has been reviewed, patient examined, no change in status, stable for surgery.  I have reviewed the patient's chart and labs.  Questions were answered to the patient's satisfaction.     Robert Greene

## 2017-12-06 NOTE — Transfer of Care (Signed)
Immediate Anesthesia Transfer of Care Note  [name]  Procedure(s) Performed: VIDEO ASSISTED THORACOSCOPY (Right Chest) LUNG BIOPSY (Right Chest)  Patient Location: PACU  Anesthesia Type:General  Level of Consciousness: awake, alert , oriented and patient cooperative  Airway & Oxygen Therapy: Patient Spontanous Breathing and Patient connected to nasal cannula oxygen  Post-op Assessment: Report given to RN, Post -op Vital signs reviewed and stable and Patient moving all extremities  Post vital signs: Reviewed and stable  Last Vitals:  Vitals Value Taken Time  BP 166/95 12/06/2017  5:03 PM  Temp    Pulse 74 12/06/2017  5:03 PM  Resp 17 12/06/2017  5:04 PM  SpO2 97 % 12/06/2017  5:03 PM  Vitals shown include unvalidated device data.  Last Pain:  Vitals:   12/06/17 1057  TempSrc:   PainSc: 0-No pain      Patients Stated Pain Goal: 0 (89/38/10 1751)  Complications: No apparent anesthesia complications

## 2017-12-07 ENCOUNTER — Inpatient Hospital Stay (HOSPITAL_COMMUNITY): Payer: 59

## 2017-12-07 ENCOUNTER — Encounter (HOSPITAL_COMMUNITY): Payer: Self-pay | Admitting: Thoracic Surgery (Cardiothoracic Vascular Surgery)

## 2017-12-07 LAB — BLOOD GAS, ARTERIAL
Acid-Base Excess: 0.1 mmol/L (ref 0.0–2.0)
BICARBONATE: 23.9 mmol/L (ref 20.0–28.0)
Drawn by: 317771
O2 CONTENT: 2 L/min
O2 Saturation: 96.4 %
PATIENT TEMPERATURE: 98.6
PH ART: 7.429 (ref 7.350–7.450)
pCO2 arterial: 36.8 mmHg (ref 32.0–48.0)
pO2, Arterial: 86.2 mmHg (ref 83.0–108.0)

## 2017-12-07 LAB — BASIC METABOLIC PANEL
ANION GAP: 10 (ref 5–15)
BUN: 12 mg/dL (ref 6–20)
CO2: 22 mmol/L (ref 22–32)
Calcium: 8.9 mg/dL (ref 8.9–10.3)
Chloride: 102 mmol/L (ref 98–111)
Creatinine, Ser: 1 mg/dL (ref 0.61–1.24)
GFR calc Af Amer: >60 mL/min (ref >60)
Glucose, Bld: 205 mg/dL — ABNORMAL HIGH (ref 70–99)
POTASSIUM: 3.7 mmol/L (ref 3.5–5.1)
SODIUM: 134 mmol/L — AB (ref 135–145)

## 2017-12-07 LAB — CBC
HCT: 43.3 % (ref 39.0–52.0)
HEMOGLOBIN: 14.5 g/dL (ref 13.0–17.0)
MCH: 30.3 pg (ref 26.0–34.0)
MCHC: 33.5 g/dL (ref 30.0–36.0)
MCV: 90.6 fL (ref 80.0–100.0)
Platelets: 164 10*3/uL (ref 150–400)
RBC: 4.78 MIL/uL (ref 4.22–5.81)
RDW: 13.2 % (ref 11.5–15.5)
WBC: 16.4 10*3/uL — AB (ref 4.0–10.5)
nRBC: 0 % (ref 0.0–0.2)

## 2017-12-07 LAB — ACID FAST SMEAR (AFB)

## 2017-12-07 LAB — ACID FAST SMEAR (AFB, MYCOBACTERIA): Acid Fast Smear: NEGATIVE

## 2017-12-07 MED ORDER — ALBUTEROL SULFATE (2.5 MG/3ML) 0.083% IN NEBU
2.5000 mg | INHALATION_SOLUTION | RESPIRATORY_TRACT | Status: DC
  Administered 2017-12-07: 2.5 mg via RESPIRATORY_TRACT
  Filled 2017-12-07: qty 3

## 2017-12-07 MED ORDER — ORAL CARE MOUTH RINSE
15.0000 mL | Freq: Two times a day (BID) | OROMUCOSAL | Status: DC
  Administered 2017-12-07 – 2017-12-08 (×4): 15 mL via OROMUCOSAL

## 2017-12-07 MED ORDER — ALBUTEROL SULFATE (2.5 MG/3ML) 0.083% IN NEBU: 2.5000 mg | INHALATION_SOLUTION | RESPIRATORY_TRACT | Status: DC | PRN

## 2017-12-07 MED ORDER — ALUM & MAG HYDROXIDE-SIMETH 200-200-20 MG/5ML PO SUSP
30.0000 mL | Freq: Four times a day (QID) | ORAL | Status: DC | PRN
  Administered 2017-12-07 (×2): 30 mL via ORAL
  Filled 2017-12-07 (×2): qty 30

## 2017-12-07 MED ORDER — KETOROLAC TROMETHAMINE 30 MG/ML IJ SOLN
30.0000 mg | Freq: Four times a day (QID) | INTRAMUSCULAR | Status: AC
  Administered 2017-12-07 – 2017-12-08 (×4): 30 mg via INTRAVENOUS
  Filled 2017-12-07 (×4): qty 1

## 2017-12-07 NOTE — Progress Notes (Signed)
1 Day Post-Op Procedure(s) (LRB): VIDEO ASSISTED THORACOSCOPY (Right) LUNG BIOPSY (Right) Subjective: C/o incisional pain   Objective: Vital signs in last 24 hours: Temp:  [97.7 F (36.5 C)-98.7 F (37.1 C)] 98.3 F (36.8 C) (11/15 0736) Pulse Rate:  [58-74] 70 (11/15 0736) Cardiac Rhythm: Normal sinus rhythm (11/15 0700) Resp:  [12-22] 18 (11/15 0736) BP: (143-172)/(85-102) 143/102 (11/15 0736) SpO2:  [93 %-100 %] 98 % (11/15 0741) Arterial Line BP: (190)/(98) 190/98 (11/14 1719) Weight:  [98.7 kg] 98.7 kg (11/14 1057)  Hemodynamic parameters for last 24 hours:    Intake/Output from previous day: 11/14 0701 - 11/15 0700 In: 1269.1 [I.V.:1169; IV Piggyback:100.1] Out: 1207 [Urine:1025; Blood:100; Chest Tube:82] Intake/Output this shift: Total I/O In: -  Out: 525 [Urine:525]  General appearance: alert, cooperative and no distress Neurologic: intact Heart: regular rate and rhythm Lungs: clear to auscultation bilaterally Abdomen: normal findings: soft, non-tender no air leak  Lab Results: Recent Labs    12/05/17 1015 12/07/17 0412  WBC 7.4 16.4*  HGB 15.2 14.5  HCT 48.6 43.3  PLT 188 164   BMET:  Recent Labs    12/05/17 1015 12/07/17 0412  NA 140 134*  K 3.7 3.7  CL 106 102  CO2 24 22  GLUCOSE 103* 205*  BUN 13 12  CREATININE 0.69 1.00  CALCIUM 9.2 8.9    PT/INR:  Recent Labs    12/05/17 1015  LABPROT 12.2  INR 0.91   ABG    Component Value Date/Time   PHART 7.429 12/07/2017 0416   HCO3 23.9 12/07/2017 0416   TCO2 24 04/03/2015 0212   O2SAT 96.4 12/07/2017 0416   CBG (last 3)  No results for input(s): GLUCAP in the last 72 hours.  Assessment/Plan: S/P Procedure(s) (LRB): VIDEO ASSISTED THORACOSCOPY (Right) LUNG BIOPSY (Right) -Doing well POD # 1 Pain control- continue PCA, add toradol for 24 hours No air leak- CT to water seal, should be able to DC tomorrow Tolerating PO- IV to KVO Enoxaparin for DVT prophylaxis Dc a  line ambulate  LOS: 1 day    Melrose Nakayama 12/07/2017

## 2017-12-07 NOTE — Progress Notes (Signed)
There is chest remove order in the morning, but Pt stated that doctor told him CT tube remove by tomorrow. Read Dr. Roxan Hockey note, but there wasn't note remove chest tube. Paged Dr. Roxan Hockey, no returned call from him. Clarified with PA [place] regarding this matter and leave chest tube in until tomorrow. Patient took Protonix as scheduled in the morning, but Pt c/o heart burn. PA Patrick Jupiter will order for it. HS [employer]

## 2017-12-07 NOTE — Discharge Instructions (Signed)
Thoracoscopy, Care After °Refer to this sheet in the next few weeks. These instructions provide you with information about caring for yourself after your procedure. Your health care provider may also give you more specific instructions. Your treatment has been planned according to current medical practices, but problems sometimes occur. Call your health care provider if you have any problems or questions after your procedure. °What can I expect after the procedure? °After your procedure, it is common to feel sore for up to two weeks. °Follow these instructions at home: °· There are many different ways to close and cover an incision, including stitches (sutures), skin glue, and adhesive strips. Follow your health care provider's instructions about: °? Incision care. °? Bandage (dressing) changes and removal. °? Incision closure removal. °· Check your incision area every day for signs of infection. Watch for: °? Redness, swelling, or pain. °? Fluid, blood, or pus. °· Take medicines only as directed by your health care provider. °· Try to cough often. Coughing helps to protect against lung infection (pneumonia). It may hurt to cough. If this happens, hold a pillow against your chest when you cough. °· Take deep breaths. This also helps to protect against pneumonia. °· If you were given an incentive spirometer, use it as directed by your health care provider. °· Do not take baths, swim, or use a hot tub until your health care provider approves. You may take showers. °· Avoid lifting until your health care provider approves. °· Avoid driving until your health care provider approves. °· Do not travel by airplane after the chest tube is removed until your health care provider approves. °Contact a health care provider if: °· You have a fever. °· Pain medicines do not ease your pain. °· You have redness, swelling, or increasing pain in your incision area. °· You develop a cough that does not go away, or you are coughing up  mucus that is yellow or green. °Get help right away if: °· You have fluid, blood, or pus coming from your incision. °· There is a bad smell coming from your incision or dressing. °· You develop a rash. °· You have difficulty breathing. °· You cough up blood. °· You develop light-headedness or you feel faint. °· You develop chest pain. °· Your heartbeat feels irregular or very fast. °This information is not intended to replace advice given to you by your health care provider. Make sure you discuss any questions you have with your health care provider. °Document Released: 07/29/2004 Document Revised: 09/12/2015 Document Reviewed: 09/24/2013 °Elsevier Interactive Patient Education © 2018 Elsevier Inc. ° °

## 2017-12-07 NOTE — Op Note (Signed)
NAME: TASEAN, MANCHA MEDICAL RECORD JA:[id number] ACCOUNT [id number] DATE OF BIRTH:09/16/1960 FACILITY: [facility] LOCATION: [facility] PHYSICIAN:STEVEN Chaya Jan, MD  OPERATIVE REPORT  DATE OF PROCEDURE:  12/06/2017  PREOPERATIVE DIAGNOSIS:  Interstitial lung disease.  POSTOPERATIVE DIAGNOSIS:  Interstitial lung disease.  PROCEDURE:  Right video-assisted thoracoscopy, Lung biopsy x3 and Intercostal nerve block.  SURGEON:  Modesto Charon, MD  ASSISTANT:  Jadene Pierini, PA-C  ANESTHESIA:  General.  FINDINGS:  Frozen section showed some fibrosis and interstitial changes.  CLINICAL NOTE:  The patient is a 57 year old gentleman with interstitial lung disease.  He was referred for a surgical biopsy.  He understood this was diagnostic and not therapeutic.  No guarantee could be given of a definitive diagnosis.  He was  informed of the indications, risks, benefits, and alternatives.  He accepted the risks and wished to proceed.  OPERATIVE NOTE:  Mr. Sofranko was brought to the operating room on 12/06/2017.  Anesthesia placed an arterial blood pressure monitoring line in the preoperative holding area.  He had induction of general anesthesia and was intubated with a double lumen  endotracheal tube.  Intravenous antibiotics were administered.  A Foley catheter was placed.  Sequential compression devices were placed for DVT prophylaxis.  He was placed in a left lateral decubitus position and the right chest was prepped and draped  in the usual sterile fashion.  Single lung ventilation of the left lung was initiated and was tolerated well throughout the procedure.  A solution containing 20 mL of liposomal bupivacaine, 30 mL of 0.5% bupivacaine and 50 mL of saline was mixed.  This was used for local anesthesia at the incisions as well as for the intercostal nerve blocks.  The area of the seventh interspace in the  mid axillary line was injected.  An incision then was made and a 5 mm port was  advanced into the chest.  The thoracoscope was advanced into the chest.  There was good isolation of the right lung.  Site was selected for incision in the fifth interspace  anterolaterally.  This area was injected with the bupivacaine solution.  An incision was made.  This was about 4 cm in length.  No rib spreading was performed during the procedure.  The CT scan showed significant changes in the superior segment of the  lower lobes well as in the upper lobe adjacent to that.  The upper lobe adjacent to the superior segment was biopsied with sequential firings of an Echelon 60 mm stapler using gold cartridges.  The specimen was removed.  A small piece was sent for AFB and fungal cultures and the remainder sent to pathology. Frozen section of the specimen subsequently returned showing interstitial changes and fibrosis.  The lateral aspect of the middle lobe then was biopsied again using the Echelon stapler and finally the superior segment of the lower  lobe was biopsied with sequential firings of the Echelon stapler.  These specimens were sent for permanent pathology only.  There was good hemostasis at all the staple lines.  Intercostal nerve blocks were performed from the third to the ninth  interspaces, 5 mL of the liposomal bupivacaine solution was injected into each interspace.  A 28 French chest tube was placed through the original port incision and secured with a #1 silk suture.  The lung was reinflated.  There was good aeration of all  3 lobes.  The working incision was closed in 3 layers.  The chest tube was placed to suction.  The patient was  placed back in supine position.  He was extubated in the operating room and taken to the [facility] in good condition.  TN/NUANCE  D:12/06/2017 T:12/07/2017 JOB:[id number]

## 2017-12-07 NOTE — Discharge Summary (Addendum)
Physician Discharge Summary  Patient ID: Robert Greene MRN: [id number] DOB/AGE: [birth year] 57 y.o.  Admit date: 12/06/2017 Discharge date: 12/09/2017  Admission Diagnoses: Interstitial lung disease  Discharge Diagnoses:  Active Problems:   ILD (interstitial lung disease) ([place])  Past Medical History:  Diagnosis Date  . Anxiety   . Arthritis    rheumatoid  . Dyspnea   . Hypercholesteremia   . Hypercholesteremia   . Hypertension   . Pulmonary fibrosis ([place])    History of present illness: The patient is a 57 year old male with a history of tobacco abuse, hypertension, hyperlipidemia and rheumatoid arthritis who was referred to Dr. Roxan Hockey for thoracic surgical consultation.  The patient smoked 1 to 1-1/4 packs of cigarettes daily for approximately 40 years.  He was recently seen by Dr. Rex Kras and recommended low-dose CT scan of his lung for surveillance.  Were no suspicious lung nodules but the CT did show evidence of interstitial lung disease and he was referred to Dr. [name] with pulmonary medicine.  He recently had a follow-up CT scan which again showed interstitial lung disease without significant honeycombing.  Does have some mild additional shortness of breath.  He was referred to Dr. Roxan Hockey for lung biopsy for further diagnostic evaluation and was admitted this hospitalization for the procedure.  Discharged Condition: good  Hospital Course: The patient was admitted electively and on 12/06/2017  he was taken to the operating room where he underwent the below described procedure.  He tolerated it well was taken to the postanesthesia care unit in stable condition.  On postoperative day #1 his chest tube had no air leak and was placed to waterseal.  Chest tube was removed on 12/08/2017.  All other routine lines monitors and drainage devices have been discontinued in the standard fashion.  He does not have a postoperative acute blood loss anemia and hemoglobin hematocrit are  14.5/43.3 respectively.  At the time of discharge the patient is felt to be quite stable.  Postoperative hospital course:  The patient is overall progressed nicely.  He has maintained stable hemodynamics.  Pathology is currently pending.  Pathology: pending  Consults: None  Significant Diagnostic Studies: Routine postop labs and serial chest x-rays  Treatments: surgery:   12/06/2017  4:48 PM  PATIENT:  Robert Greene  57 y.o. male  PRE-OPERATIVE DIAGNOSIS:  ILD  POST-OPERATIVE DIAGNOSIS:  ILD  PROCEDURE:  Procedure(s): VIDEO ASSISTED THORACOSCOPY (Right) LUNG BIOPSY (Right)  INTERCOSTAL NERVE BLOCKS- 3-9  SURGEON:  Surgeon(s) and Role:    * Melrose Nakayama, MD - Primary  PHYSICIAN ASSISTANT: WAYNE GOLD PA-C  ANESTHESIA:   general  Discharge Exam: Blood pressure 135/77, pulse (!) 52, temperature 98.1 F (36.7 C), temperature source Oral, resp. rate 18, height 5\' 11"  (1.803 m), weight 98.7 kg, SpO2 95 %.  General appearance: alert, cooperative and no distress Heart: sinus brady Lungs: clear to auscultation bilaterally Abdomen: soft, non-tender; bowel sounds normal; no masses,  no organomegaly Extremities: extremities normal, atraumatic, no cyanosis or edema Wound: clean and dry  Disposition: Discharge disposition: 01-Home or Self Care        Allergies as of 12/09/2017      Reactions   Bee Venom Anaphylaxis, Hives, Rash   Spider Antivenin [black Widow Spider Antivenin (l.mactans)] Anaphylaxis, Hives, Rash      Medication List    STOP taking these medications   naproxen sodium 220 MG tablet Commonly known as:  ALEVE     TAKE these medications   acetaminophen 500  MG tablet Commonly known as:  TYLENOL Take 2 tablets (1,000 mg total) by mouth every 6 (six) hours.   atorvastatin 80 MG tablet Commonly known as:  LIPITOR Take 80 mg by mouth daily.   diphenhydrAMINE 25 MG tablet Commonly known as:  BENADRYL Take 25 mg by mouth daily  as needed for itching.   EPINEPHrine 0.3 mg/0.3 mL Soaj injection Commonly known as:  EPI-PEN Use once.  If ineffective, use 2nd dose. What changed:    how much to take  how to take this  when to take this  additional instructions   FISH OIL PO Take 2 capsules by mouth 2 (two) times daily.   gabapentin 100 MG capsule Commonly known as:  NEURONTIN Take 100 mg by mouth at bedtime.   HUMIRA PEN 40 MG/0.8ML Pnkt Generic drug:  Adalimumab Inject 40 mg into the skin every 14 (fourteen) days.   hydrochlorothiazide 12.5 MG tablet Commonly known as:  HYDRODIURIL Take 12.5 mg by mouth daily.   hydroxychloroquine 200 MG tablet Commonly known as:  PLAQUENIL Take 200 mg by mouth daily.   losartan 100 MG tablet Commonly known as:  COZAAR Take 100 mg by mouth daily.   ranitidine 150 MG tablet Commonly known as:  ZANTAC Take 150 mg by mouth at bedtime as needed for heartburn.   traMADol 50 MG tablet Commonly known as:  ULTRAM Take 1 tablet (50 mg total) by mouth every 6 (six) hours as needed (mild pain).      Follow-up Information    [facility] Follow up.   Specialty:  Cardiothoracic Surgery Why:  Appointment with the nurse on 12/14/2017 at 11:30 AM for suture removal Contact information: [place], [place]       Melrose Nakayama, MD Follow up.   Specialty:  Cardiothoracic Surgery Why:  Appointment to see the surgeon on 12/25/2017 at 3 PM.  Is obtain a chest x-ray at [place] at 2:30 PM.  [facility] imaging is located in the same office complex on the first floor. Contact information: [address] [place] [address] [phone]        Brand Males, MD. Call in 1 day(s).   Specialty:  Pulmonary Disease Why:  Appointment scheduled for early December Contact information: [place]  [id number] [phone]           Signed: Elgie Collard 12/09/2017, 11:27 AM

## 2017-12-07 NOTE — Clinical Social Work Note (Signed)
CSW acknowledges consult for medications. RNCM has been notified.   CSW signing off. Consult again if any social work needs arise.  Dayton Scrape, [place]

## 2017-12-08 ENCOUNTER — Inpatient Hospital Stay (HOSPITAL_COMMUNITY): Payer: 59

## 2017-12-08 LAB — COMPREHENSIVE METABOLIC PANEL
ALT: 31 U/L (ref 0–44)
AST: 30 U/L (ref 15–41)
Albumin: 3.4 g/dL — ABNORMAL LOW (ref 3.5–5.0)
Alkaline Phosphatase: 54 U/L (ref 38–126)
Anion gap: 6 (ref 5–15)
BILIRUBIN TOTAL: 0.9 mg/dL (ref 0.3–1.2)
BUN: 14 mg/dL (ref 6–20)
CHLORIDE: 102 mmol/L (ref 98–111)
CO2: 27 mmol/L (ref 22–32)
CREATININE: 0.83 mg/dL (ref 0.61–1.24)
Calcium: 8.6 mg/dL — ABNORMAL LOW (ref 8.9–10.3)
GFR calc Af Amer: >60 mL/min (ref >60)
Glucose, Bld: 133 mg/dL — ABNORMAL HIGH (ref 70–99)
Potassium: 3.6 mmol/L (ref 3.5–5.1)
Sodium: 135 mmol/L (ref 135–145)
TOTAL PROTEIN: 6.1 g/dL — AB (ref 6.5–8.1)

## 2017-12-08 LAB — CBC
HCT: 42.3 % (ref 39.0–52.0)
HEMOGLOBIN: 13.4 g/dL (ref 13.0–17.0)
MCH: 29.2 pg (ref 26.0–34.0)
MCHC: 31.7 g/dL (ref 30.0–36.0)
MCV: 92.2 fL (ref 80.0–100.0)
NRBC: 0 % (ref 0.0–0.2)
Platelets: 155 10*3/uL (ref 150–400)
RBC: 4.59 MIL/uL (ref 4.22–5.81)
RDW: 13.6 % (ref 11.5–15.5)
WBC: 13.1 10*3/uL — AB (ref 4.0–10.5)

## 2017-12-08 MED ORDER — GUAIFENESIN ER 600 MG PO TB12
1200.0000 mg | ORAL_TABLET | Freq: Two times a day (BID) | ORAL | Status: DC
  Administered 2017-12-08 – 2017-12-09 (×3): 1200 mg via ORAL
  Filled 2017-12-08 (×3): qty 2

## 2017-12-08 MED ORDER — POTASSIUM CHLORIDE CRYS ER 20 MEQ PO TBCR
20.0000 meq | EXTENDED_RELEASE_TABLET | Freq: Two times a day (BID) | ORAL | Status: AC
  Administered 2017-12-08 (×2): 20 meq via ORAL
  Filled 2017-12-08 (×2): qty 1

## 2017-12-08 NOTE — Progress Notes (Signed)
HR 40's-50's Sinus brady around 9 am. Stayed few minuts on 50's then back to 60's, but continuous HR between 50's - 60's. Especially when he resting on the bed. When he ambulates, HR goes up 70's-80's. Notified PA Tessa regarding this matter and hold losartan. Wasted fentanyl 18 ml into container and witness by RN Earlie Server. HS [employer]

## 2017-12-08 NOTE — Progress Notes (Signed)
Patient ID: Robert Greene, male   DOB: [birth year], 57 y.o.   MRN: [id number]      [address]       [place] [address]             [phone]                 2 Days Post-Op Procedure(s) (LRB): VIDEO ASSISTED THORACOSCOPY (Right) LUNG BIOPSY (Right)  LOS: 2 days   Subjective: Ambulating ,   Objective: Vital signs in last 24 hours: Patient Vitals for the past 24 hrs:  BP Temp Temp src Pulse Resp SpO2  12/08/17 0832 - - - - 18 -  12/08/17 0751 137/88 98 F (36.7 C) Oral (!) 55 17 97 %  12/08/17 0418 123/76 97.8 F (36.6 C) Oral 62 16 94 %  12/08/17 0038 - - - - (!) 25 92 %  12/07/17 2329 131/84 98.2 F (36.8 C) Oral 60 19 94 %  12/07/17 2023 - - - - 15 94 %  12/07/17 1945 (!) 143/85 97.6 F (36.4 C) Oral 82 20 93 %  12/07/17 1559 - - - - (!) 23 98 %  12/07/17 1533 (!) 145/81 (!) 97.5 F (36.4 C) Oral 63 13 95 %  12/07/17 1307 - - - - (!) 22 94 %  12/07/17 1200 (!) 155/95 97.9 F (36.6 C) Oral 70 18 92 %    Filed Weights   12/06/17 1057  Weight: 98.7 kg    Hemodynamic parameters for last 24 hours:    Intake/Output from previous day: 11/15 0701 - 11/16 0700 In: 993.5 [P.O.:360; I.V.:633.5] Out: 1577 [Urine:1375; Chest Tube:202] Intake/Output this shift: No intake/output data recorded.  Scheduled Meds: . acetaminophen  1,000 mg Oral Q6H   Or  . acetaminophen (TYLENOL) oral liquid 160 mg/5 mL  1,000 mg Oral Q6H  . atorvastatin  80 mg Oral Daily  . bisacodyl  10 mg Oral Daily  . enoxaparin (LOVENOX) injection  40 mg Subcutaneous Q24H  . fentaNYL   Intravenous Q4H  . gabapentin  100 mg Oral QHS  . hydrochlorothiazide  12.5 mg Oral Daily  . losartan  100 mg Oral Daily  . mouth rinse  15 mL Mouth Rinse BID  . pantoprazole  40 mg Oral Daily  . senna-docusate  1 tablet Oral QHS   Continuous Infusions: . dextrose 5 % and 0.9% NaCl 10 mL/hr at 12/07/17 0910  . potassium chloride     PRN Meds:.albuterol, alum & mag hydroxide-simeth,  diphenhydrAMINE **OR** diphenhydrAMINE, naloxone **AND** sodium chloride flush, ondansetron (ZOFRAN) IV, potassium chloride, traMADol  General appearance: alert, cooperative and no distress Neurologic: intact Heart: regular rate and rhythm, S1, S2 normal, no murmur, click, rub or gallop Lungs: diminished breath sounds RLL Abdomen: soft, non-tender; bowel sounds normal; no masses,  no organomegaly Extremities: extremities normal, atraumatic, no cyanosis or edema and Homans sign is negative, no sign of DVT Wound: intact  Lab Results: CBC: Recent Labs    12/07/17 0412 12/08/17 0231  WBC 16.4* 13.1*  HGB 14.5 13.4  HCT 43.3 42.3  PLT 164 155   BMET:  Recent Labs    12/07/17 0412 12/08/17 0231  NA 134* 135  K 3.7 3.6  CL 102 102  CO2 22 27  GLUCOSE 205* 133*  BUN 12 14  CREATININE 1.00 0.83  CALCIUM 8.9 8.6*    PT/INR: No results for input(s): LABPROT, INR in the last 72 hours.   Radiology Dg  Chest Port 1 View  Result Date: 12/08/2017 CLINICAL DATA:  Follow-up right lung biopsy EXAM: PORTABLE CHEST 1 VIEW COMPARISON:  12/07/2017 FINDINGS: Cardiac shadow is mildly enlarged but accentuated by the portable technique. The lungs are well aerated bilaterally. Minimal pneumothorax is noted in the right apex. Right chest tube is noted in place. No focal infiltrate or effusion is seen. Mild stable interstitial changes are noted. IMPRESSION: Minimal right apical pneumothorax. Chest tube in satisfactory position. Electronically Signed   By: Inez Catalina M.D.   On: 12/08/2017 09:39   Dg Chest Port 1 View  Result Date: 12/07/2017 CLINICAL DATA:  Interstitial lung disease. EXAM: PORTABLE CHEST 1 VIEW COMPARISON:  12/06/2017. FINDINGS: Right chest tube in stable position. No pneumothorax identified. Stable right chest wall subcutaneous emphysema. Stable cardiomegaly. Stable right lung postsurgical change. Right perihilar atelectatic changes again noted. No pleural effusion. No acute bony  abnormality. IMPRESSION: Right chest tube in stable position. No pneumothorax. Stable right chest wall subcutaneous emphysema. Postsurgical changes right lung unchanged. Mild right perihilar atelectasis again noted. Electronically Signed   By: Marcello Moores  Register   On: 12/07/2017 09:42   Dg Chest Port 1 View  Result Date: 12/06/2017 CLINICAL DATA:  Status post right lung biopsy. EXAM: PORTABLE CHEST 1 VIEW COMPARISON:  December 05, 2017 FINDINGS: Postsurgical changes are seen in the right mid lung with a suture line. A right chest tube is identified. No definitive pneumothorax. The cardiomediastinal silhouette is stable. No other acute abnormalities. Air is seen in the soft tissues of the right chest wall. IMPRESSION: Postoperative changes on the right with a suture line in the right mid lung and air in the subcutaneous tissues of the right chest wall. There is a right chest tube but no identified pneumothorax. Electronically Signed   By: Dorise Bullion III M.D   On: 12/06/2017 19:33     Assessment/Plan: S/P Procedure(s) (LRB): VIDEO ASSISTED THORACOSCOPY (Right) LUNG BIOPSY (Right) Mobilize D/c chest tube  Start laxative, stool softener Fu chest xray in am   Grace Isaac MD 12/08/2017 11:06 AM

## 2017-12-08 NOTE — Plan of Care (Signed)
Pt progressing

## 2017-12-09 ENCOUNTER — Inpatient Hospital Stay (HOSPITAL_COMMUNITY): Payer: 59

## 2017-12-09 MED ORDER — TRAMADOL HCL 50 MG PO TABS: 50.0000 mg | ORAL_TABLET | Freq: Four times a day (QID) | ORAL | 0 refills | Status: DC | PRN

## 2017-12-09 MED ORDER — ACETAMINOPHEN 500 MG PO TABS: 1000.0000 mg | ORAL_TABLET | Freq: Four times a day (QID) | ORAL | 0 refills | Status: DC

## 2017-12-09 NOTE — Progress Notes (Addendum)
      [address]       [place] [address]             [phone]      3 Days Post-Op Procedure(s) (LRB): VIDEO ASSISTED THORACOSCOPY (Right) LUNG BIOPSY (Right) Subjective: Ready for discharge today  Objective: Vital signs in last 24 hours: Temp:  [97.5 F (36.4 C)-98.9 F (37.2 C)] 98.1 F (36.7 C) (11/17 0743) Pulse Rate:  [52-81] 52 (11/17 0743) Cardiac Rhythm: Sinus bradycardia (11/17 0750) Resp:  [15-37] 18 (11/17 0743) BP: (123-173)/(59-99) 135/77 (11/17 0743) SpO2:  [92 %-99 %] 95 % (11/17 0743)     Intake/Output from previous day: 11/16 0701 - 11/17 0700 In: 1340 [P.O.:1030; I.V.:310] Out: 1000 [Urine:1000] Intake/Output this shift: Total I/O In: 120 [P.O.:120] Out: -   General appearance: alert, cooperative and no distress Heart: sinus brady Lungs: clear to auscultation bilaterally Abdomen: soft, non-tender; bowel sounds normal; no masses,  no organomegaly Extremities: extremities normal, atraumatic, no cyanosis or edema Wound: clean and dry  Lab Results: Recent Labs    12/07/17 0412 12/08/17 0231  WBC 16.4* 13.1*  HGB 14.5 13.4  HCT 43.3 42.3  PLT 164 155   BMET:  Recent Labs    12/07/17 0412 12/08/17 0231  NA 134* 135  K 3.7 3.6  CL 102 102  CO2 22 27  GLUCOSE 205* 133*  BUN 12 14  CREATININE 1.00 0.83  CALCIUM 8.9 8.6*    PT/INR: No results for input(s): LABPROT, INR in the last 72 hours. ABG    Component Value Date/Time   PHART 7.429 12/07/2017 0416   HCO3 23.9 12/07/2017 0416   TCO2 24 04/03/2015 0212   O2SAT 96.4 12/07/2017 0416   CBG (last 3)  No results for input(s): GLUCAP in the last 72 hours.  Dg Chest 2 View  Result Date: 12/09/2017 CLINICAL DATA:  Status post right VATS and lung biopsy EXAM: CHEST - 2 VIEW COMPARISON:  12/08/2017 FINDINGS: Cardiac shadow is stable. Previously seen right-sided chest tube has been removed in the interval. The tiny right apical pneumothorax is again identified  and stable. No new focal infiltrate is seen. Mild stable interstitial opacities are seen. No bony abnormality is noted. IMPRESSION: Tiny right apical pneumothorax stable from the previous exam. Stable interstitial opacities bilaterally. Electronically Signed   By: Inez Catalina M.D.   On: 12/09/2017 07:24   Assessment/Plan: S/P Procedure(s) (LRB): VIDEO ASSISTED THORACOSCOPY (Right) LUNG BIOPSY (Right)  1. Tiny stable right apical pneumo s/p chest tube removal yesterday. Tolerating room air with excellent oxygen saturation.  2. Sinus brady in the 50s, BP well controlled. Continue cozaar and HCTZ. Not on BB. He does drop into the 40s when he is sleeping.  3. Renal function stable-creatinine 0.83 4. Not a diabetic 5. H and H is stable- 13.4/42.3  Plan: Discharge today. Will need cardiology follow-up for bradycardia. Hold off on suture removal-we will remove in our office.     LOS: 3 days    Elgie Collard 12/09/2017  Patient feels well today Chest xray this am reviewed  I have seen and examined Robert Greene and agree with the above assessment  and plan.  Grace Isaac MD [place] [phone] Office [phone] 12/09/2017 11:35 AM

## 2017-12-09 NOTE — Plan of Care (Signed)

## 2017-12-09 NOTE — Progress Notes (Signed)
Pt discharge instructions given to patient by RN, pt understood upcoming appointments and new medication changes. Pt discharged with spouse.

## 2017-12-11 LAB — AEROBIC/ANAEROBIC CULTURE W GRAM STAIN (SURGICAL/DEEP WOUND)
Culture: NO GROWTH
Gram Stain: NONE SEEN

## 2017-12-14 ENCOUNTER — Ambulatory Visit (INDEPENDENT_AMBULATORY_CARE_PROVIDER_SITE_OTHER): Payer: Self-pay

## 2017-12-14 DIAGNOSIS — Z4802 Encounter for removal of sutures: Secondary | ICD-10-CM

## 2017-12-14 DIAGNOSIS — J849 Interstitial pulmonary disease, unspecified: Secondary | ICD-10-CM

## 2017-12-14 NOTE — Progress Notes (Signed)
Removed 1 suture from chest tube incision sites, no signs of infection and patient tolerated well.

## 2017-12-15 ENCOUNTER — Telehealth: Payer: Self-pay | Admitting: Internal Medicine

## 2017-12-15 NOTE — Telephone Encounter (Signed)
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  Pl;an -he will send to [facility] in [place], [place] for 2nd opinion - keep fu with Mannam 01/08/18    SIGNATURE    Dr. Brand Males, M.D., F.C.C.P,  Pulmonary and Critical Care Medicine Staff Physician, [place] Director - Interstitial Lung Disease  Program  Pulmonary [place] at [place], [place], [address]  Pager: [phone], If no answer or between  15:00h - 7:00h: call [phone] Telephone: [phone]  4:16 PM 12/15/2017

## 2017-12-17 ENCOUNTER — Other Ambulatory Visit (HOSPITAL_COMMUNITY)
Admission: RE | Admit: 2017-12-17 | Discharge: 2017-12-17 | Disposition: A | Discharge status: Home / Self Care | Payer: 59 | Source: Ambulatory Visit | Attending: Anatomic Pathology | Admitting: Anatomic Pathology

## 2017-12-17 DIAGNOSIS — J849 Interstitial pulmonary disease, unspecified: Secondary | ICD-10-CM | POA: Diagnosis present

## 2017-12-17 DIAGNOSIS — J84115 Respiratory bronchiolitis interstitial lung disease: Secondary | ICD-10-CM | POA: Insufficient documentation

## 2017-12-18 ENCOUNTER — Other Ambulatory Visit: Payer: Self-pay | Admitting: Physician Assistant

## 2017-12-20 ENCOUNTER — Other Ambulatory Visit: Payer: Self-pay | Admitting: Physician Assistant

## 2017-12-25 ENCOUNTER — Ambulatory Visit
Admission: RE | Admit: 2017-12-25 | Discharge: 2017-12-25 | Disposition: A | Discharge status: Home / Self Care | Payer: 59 | Source: Ambulatory Visit | Attending: Thoracic Surgery (Cardiothoracic Vascular Surgery) | Admitting: Thoracic Surgery (Cardiothoracic Vascular Surgery)

## 2017-12-25 ENCOUNTER — Ambulatory Visit (INDEPENDENT_AMBULATORY_CARE_PROVIDER_SITE_OTHER): Payer: Self-pay | Admitting: Thoracic Surgery (Cardiothoracic Vascular Surgery)

## 2017-12-25 ENCOUNTER — Other Ambulatory Visit: Payer: Self-pay | Admitting: Thoracic Surgery (Cardiothoracic Vascular Surgery)

## 2017-12-25 VITALS — BP 150/87 | HR 90 | Resp 20 | Ht 71.0 in | Wt 217.0 lb

## 2017-12-25 DIAGNOSIS — J849 Interstitial pulmonary disease, unspecified: Secondary | ICD-10-CM

## 2017-12-25 DIAGNOSIS — Z09 Encounter for follow-up examination after completed treatment for conditions other than malignant neoplasm: Secondary | ICD-10-CM

## 2017-12-25 MED ORDER — TRAMADOL HCL 50 MG PO TABS: 50.0000 mg | ORAL_TABLET | Freq: Four times a day (QID) | ORAL | 0 refills | Status: DC | PRN

## 2017-12-25 NOTE — Progress Notes (Signed)
[address]       [place], [address]             [phone]    HPI: Mr. Nee returns for scheduled follow-up visit  Vaughn Beaumier is a 57 year old gentleman with history of tobacco abuse who recently was found to have interstitial lung disease on a low-dose screening CT for lung cancer.  He was referred for a lung biopsy.  I did that on 12/06/2017.  Postoperative course was uncomplicated and he went home on day 3.  He is still having some incisional pain.  He is run out of tramadol.  He is requesting a refill on that primarily to use at night before he goes to bed.  His breathing is unchanged.  He did stop smoking.  He has an appointment with Dr. [name] on December 19.  Past Medical History:  Diagnosis Date  . Anxiety   . Arthritis    rheumatoid  . Dyspnea   . Hypercholesteremia   . Hypercholesteremia   . Hypertension   . Pulmonary fibrosis ([place])     Current Outpatient Medications  Medication Sig Dispense Refill  . acetaminophen (TYLENOL) 500 MG tablet Take 2 tablets (1,000 mg total) by mouth every 6 (six) hours. 30 tablet 0  . atorvastatin (LIPITOR) 80 MG tablet Take 80 mg by mouth daily.  1  . diphenhydrAMINE (BENADRYL) 25 MG tablet Take 25 mg by mouth daily as needed for itching.     Marland Kitchen EPINEPHrine 0.3 mg/0.3 mL IJ SOAJ injection Use once.  If ineffective, use 2nd dose. (Patient taking differently: Inject 0.3 mg into the muscle once. ) 2 Device 0  . gabapentin (NEURONTIN) 100 MG capsule Take 100 mg by mouth at bedtime.     Marland Kitchen HUMIRA PEN 40 MG/0.8ML PNKT Inject 40 mg into the skin every 14 (fourteen) days.    . hydrochlorothiazide (HYDRODIURIL) 12.5 MG tablet Take 12.5 mg by mouth daily.     . hydroxychloroquine (PLAQUENIL) 200 MG tablet Take 200 mg by mouth daily.   2  . losartan (COZAAR) 100 MG tablet Take 100 mg by mouth daily.  5  . Omega-3 Fatty Acids (FISH OIL PO) Take 2 capsules by mouth 2 (two) times daily.     . ranitidine (ZANTAC) 150  MG tablet Take 150 mg by mouth at bedtime as needed for heartburn.     . traMADol (ULTRAM) 50 MG tablet Take 1 tablet (50 mg total) by mouth every 6 (six) hours as needed (mild pain). 20 tablet 0   No current facility-administered medications for this visit.     Physical Exam BP (!) 150/87   Pulse 90   Resp 20   Ht 5\' 11"  (1.803 m)   Wt 217 lb (98.4 kg)   SpO2 99% Comment: RA  BMI 30.78 kg/m  57 year old man in no acute distress Alert and oriented x3 with no focal deficits Lungs with faint crackles bilaterally Cardiac regular rate and rhythm Incisions healing well  Diagnostic Tests: CHEST - 2 VIEW  COMPARISON:  12/09/17  FINDINGS: Cardiac shadows within normal limits. The lungs are well aerated bilaterally. Patchy bibasilar changes are noted similar to that seen on the prior exam consistent with the given clinical history of interstitial lung disease. No new focal infiltrate or effusion is seen. No bony abnormality is noted.  IMPRESSION: Patchy interstitial changes in the bases bilaterally stable from the previous exam.   Electronically Signed   By: Elta Guadeloupe  Lukens M.D.   On: 12/25/2017 14:43 I personally reviewed the chest x-ray images and concur with the findings noted above  Impression: Mr. Disney is a 57 year old gentleman with a history of exposure to paint and also tobacco abuse.  He has interstitial lung disease that was found on a low-dose CT screening for lung cancer.  I did a right VATS for lung biopsies on 12/06/2017.  He did well postoperatively and went home on day 3.  He is still having some incisional pain.  It primarily bothers him at night and then when he first gets up in the morning.  He is requesting additional tramadol.  I gave him a prescription for 20 tablets no refills he can use 1 tablet up to 4 times daily as needed for pain.  He also may use acetaminophen or ibuprofen for pain if needed.  He may begin driving.  Appropriate precautions  were discussed.  He should build into new activities gradually but there are no strict limits on his activities.  Plan:  Tramadol 50 mg p.o. every 6 hours as needed, 20 tablets, no refills Follow-up with Dr. [name] as scheduled I will be happy to see him back anytime in the future if I can be of any further assistance with his care  Melrose Nakayama, MD [facility] [phone]

## 2017-12-26 ENCOUNTER — Encounter (HOSPITAL_COMMUNITY): Payer: Self-pay | Admitting: Thoracic Surgery (Cardiothoracic Vascular Surgery)

## 2017-12-27 LAB — CULTURE, FUNGUS WITHOUT SMEAR

## 2018-01-01 ENCOUNTER — Telehealth: Payer: Self-pay | Admitting: Internal Medicine

## 2018-01-01 DIAGNOSIS — J849 Interstitial pulmonary disease, unspecified: Secondary | ICD-10-CM

## 2018-01-01 NOTE — Telephone Encounter (Signed)
Called and spoke with pt letting him know that MR was wanting him to repeat PFT prior to OV with Dr.Mannam. Pt expressed understanding. appt has been scheduled for pt to have PFT (spiro and dlco only) Friday, 12/13 at 11:30. Pt does have our new office address.  Order was also placed for the PFT. Nothing further needed.

## 2018-01-01 NOTE — Telephone Encounter (Signed)
Raquel Sarna: Please have patient do spirometry/dlco prior to seeing Dr Vaughan Browner next week  Praveen: Bx read by local and [facility] - is SRIF. He still smokes. He si coming to review bx results with you.   THanks    SIGNATURE    Dr. Brand Males, M.D., F.C.C.P,  Pulmonary and Critical Care Medicine Staff Physician, [employer] Director - Interstitial Lung Disease  Program  Pulmonary [place] at [place], [place], [address]  Pager: [phone], If no answer or between  15:00h - 7:00h: call [phone] Telephone: [phone]  9:09 AM 01/01/2018       reports that he has been smoking cigarettes. He has a 50.00 pack-year smoking history. He has never used smokeless tobacco.

## 2018-01-04 ENCOUNTER — Ambulatory Visit (INDEPENDENT_AMBULATORY_CARE_PROVIDER_SITE_OTHER): Payer: 59 | Admitting: Internal Medicine

## 2018-01-04 DIAGNOSIS — J849 Interstitial pulmonary disease, unspecified: Secondary | ICD-10-CM | POA: Diagnosis not present

## 2018-01-04 LAB — PULMONARY FUNCTION TEST
DL/VA % PRED: 97 %
DL/VA: 4.45 ml/min/mmHg/L
DLCO unc % pred: 81 %
DLCO unc: 25.65 ml/min/mmHg
FEF 25-75 Pre: 1.93 L/sec
FEF2575-%Pred-Pre: 63 %
FEV1-%Pred-Pre: 78 %
FEV1-PRE: 2.84 L
FEV1FVC-%Pred-Pre: 94 %
FEV6-%Pred-Pre: 85 %
FEV6-PRE: 3.89 L
FEV6FVC-%Pred-Pre: 102 %
FVC-%Pred-Pre: 83 %
FVC-PRE: 3.95 L
Pre FEV1/FVC ratio: 72 %
Pre FEV6/FVC Ratio: 98 %

## 2018-01-04 NOTE — Progress Notes (Signed)
Spirometry and dlco. 

## 2018-01-08 ENCOUNTER — Encounter: Payer: Self-pay | Admitting: Pulmonary Disease

## 2018-01-08 ENCOUNTER — Ambulatory Visit (INDEPENDENT_AMBULATORY_CARE_PROVIDER_SITE_OTHER): Payer: 59 | Admitting: Pulmonary Disease

## 2018-01-08 VITALS — BP 132/84 | HR 74 | Ht 71.0 in | Wt 222.0 lb

## 2018-01-08 DIAGNOSIS — M0600 Rheumatoid arthritis without rheumatoid factor, unspecified site: Secondary | ICD-10-CM

## 2018-01-08 DIAGNOSIS — J849 Interstitial pulmonary disease, unspecified: Secondary | ICD-10-CM

## 2018-01-08 NOTE — Patient Instructions (Addendum)
Your lung biopsy shows smoking-related interstitial lung disease and scarring The treatment for this is to stop smoking and not pick up cigarettes again  There is no evidence that the rheumatoid arthritis is causing the pulmonary fibrosis We will need to keep a close watch on your lungs to make sure there is no progression I will order 6-minute walk test, spirometry and diffusion capacity 3 months time. Follow-up with Dr. [name] after these tests.

## 2018-01-08 NOTE — Progress Notes (Addendum)
Robert Greene    [id number]    22-Jul-1960  Primary Care Physician:Little, Lennette Bihari, MD  Referring Physician: [name], [place], [place] [address]  Chief complaint: Follow-up for interstitial lung disease.  HPI: 57 Y/O with ILD, rheumatoid arthritis, heavy smoker with inhalational exposures. Patient of Dr. 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  Underwent surgical lung biopsy and is here for review of biopsy results  States that he quit smoking in November 2019 History of rheumatoid arthritis.  He is currently on Humira with good control of symptoms. He has recovered well from surgery.  He has some mild tenderness at the site otherwise no dyspnea, cough, sputum production.  Pets: Has a cat Occupation: Works as a Curator Exposures: Reports exposure to paint fumes mold, remote exposure to pigeons Smoking history: 50-pack-year smoker.  Quit smoking in Nov 2019 Travel history: No significant travel. Relevant family history: Father had COPD.  Outpatient Encounter Medications as of 01/08/2018  Medication Sig  . acetaminophen (TYLENOL) 500 MG tablet Take 2 tablets (1,000 mg total) by mouth every 6 (six) hours.  Marland Kitchen atorvastatin (LIPITOR) 80 MG tablet Take 80 mg by mouth daily.  . diphenhydrAMINE (BENADRYL) 25 MG tablet Take 25 mg by mouth daily as needed for itching.   Marland Kitchen EPINEPHrine 0.3 mg/0.3 mL IJ SOAJ injection Use once.  If ineffective, use 2nd dose. (Patient taking differently: Inject 0.3 mg into the muscle once. )  . gabapentin (NEURONTIN) 100 MG capsule Take 100 mg by mouth at bedtime.   Marland Kitchen HUMIRA PEN 40 MG/0.8ML PNKT Inject 40 mg into the skin every 14 (fourteen) days.  . hydrochlorothiazide (HYDRODIURIL) 12.5 MG tablet Take 12.5 mg by mouth daily.   . hydroxychloroquine (PLAQUENIL) 200 MG tablet Take 200 mg by mouth daily.   Marland Kitchen losartan (COZAAR) 100 MG tablet Take 100 mg by mouth daily.  . Omega-3 Fatty  Acids (FISH OIL PO) Take 2 capsules by mouth 2 (two) times daily.   . ranitidine (ZANTAC) 150 MG tablet Take 150 mg by mouth at bedtime as needed for heartburn.   . [DISCONTINUED] traMADol (ULTRAM) 50 MG tablet Take 1 tablet (50 mg total) by mouth every 6 (six) hours as needed (mild pain).   No facility-administered encounter medications on file as of 01/08/2018.     Allergies as of 01/08/2018 - Review Complete 01/08/2018  Allergen Reaction Noted  . Bee venom Anaphylaxis, Hives, and Rash 07/25/2013  . Spider antivenin [black widow spider antivenin (l.mactans)] Anaphylaxis, Hives, and Rash 05/12/2013    Past Medical History:  Diagnosis Date  . Anxiety   . Arthritis    rheumatoid  . Dyspnea   . Hypercholesteremia   . Hypercholesteremia   . Hypertension   . Pulmonary fibrosis ([place])     Past Surgical History:  Procedure Laterality Date  . JOINT REPLACEMENT     x2 scopes not replacement  . LUNG BIOPSY Right 12/06/2017   Procedure: LUNG BIOPSY;  Surgeon: Melrose Nakayama, MD;  Location: [facility];  Service: Thoracic;  Laterality: Right;  Marland Kitchen VIDEO ASSISTED THORACOSCOPY Right 12/06/2017   Procedure: VIDEO ASSISTED THORACOSCOPY;  Surgeon: Melrose Nakayama, MD;  Location: [facility];  Service: Thoracic;  Laterality: Right;    No family history on file.  Social History   Socioeconomic History  . Marital status: Married    Spouse name: Not on file  . Number of children: Not on  file  . Years of education: Not on file  . Highest education level: Not on file  Occupational History  . Not on file  Social Needs  . Financial resource strain: Not on file  . Food insecurity:    Worry: Not on file    Inability: Not on file  . Transportation needs:    Medical: Not on file    Non-medical: Not on file  Tobacco Use  . Smoking status: Former Smoker    Packs/day: 1.25    Years: 40.00    Pack years: 50.00    Types: Cigarettes    Last attempt to quit: 12/05/2017    Years since  quitting: 0.0  . Smokeless tobacco: Never Used  . Tobacco comment: 5 cigarettes per day 11/22/2017  Substance and Sexual Activity  . Alcohol use: Yes    Alcohol/week: 1.0 standard drinks    Types: 1 Standard drinks or equivalent per week    Comment: daily  . Drug use: No  . Sexual activity: Yes    Birth control/protection: None  Lifestyle  . Physical activity:    Days per week: Not on file    Minutes per session: Not on file  . Stress: Not on file  Relationships  . Social connections:    Talks on phone: Not on file    Gets together: Not on file    Attends religious service: Not on file    Active member of club or organization: Not on file    Attends meetings of clubs or organizations: Not on file    Relationship status: Not on file  . Intimate partner violence:    Fear of current or ex partner: Not on file    Emotionally abused: Not on file    Physically abused: Not on file    Forced sexual activity: Not on file  Other Topics Concern  . Not on file  Social History Narrative  . Not on file   Review of systems: Review of Systems  Constitutional: Negative for fever and chills.  HENT: Negative.   Eyes: Negative for blurred vision.  Respiratory: as per HPI  Cardiovascular: Negative for chest pain and palpitations.  Gastrointestinal: Negative for vomiting, diarrhea, blood per rectum. Genitourinary: Negative for dysuria, urgency, frequency and hematuria.  Musculoskeletal: Negative for myalgias, back pain and joint pain.  Skin: Negative for itching and rash.  Neurological: Negative for dizziness, tremors, focal weakness, seizures and loss of consciousness.  Endo/Heme/Allergies: Negative for environmental allergies.  Psychiatric/Behavioral: Negative for depression, suicidal ideas and hallucinations.  All other systems reviewed and are negative.  Physical Exam: Blood pressure 132/84, pulse 74, height 5\' 11"  (1.803 m), weight 222 lb (100.7 kg), SpO2 96 %. Gen:      No acute  distress HEENT:  EOMI, sclera anicteric Neck:     No masses; no thyromegaly Lungs:    Clear to auscultation bilaterally; normal respiratory effort CV:         Regular rate and rhythm; no murmurs Abd:      + bowel sounds; soft, non-tender; no palpable masses, no distension Ext:    No edema; adequate peripheral perfusion Skin:      Warm and dry; no rash Neuro: alert and oriented x 3 Psych: normal mood and affect  Data Reviewed: Imaging: CT high-resolution 03/22/2017- groundglass attenuation, peripheral bronchiolectasis.  No honeycombing with mild basilar gradient.  Mild emphysematous changes. CT high-resolution 09/06/2017-stable interstitial lung disease and emphysema. I reviewed the images personally  PFTs: 09/26/2017-FVC  4.32 [91%], FEV1 3.03 [84%], F/F 70, DLCO 97% 01/04/2018-FVC 3.95 [83%], FEV1 2.84 [78%], F/F 72, DLCO 81%  Pathology Surgical lung biopsy 12/06/2017 Chronic interstitial lung disease with emphysema, respiratory bronchiolitis and smoking-related interstitial fibrosis Case sent to [facility] clinic for second opinion with similar read.  Assessment:  Interstitial lung disease Path reviewed with patient.  Findings are consistent with smoking-related interstitial fibrosis The main treatment for this is smoking cessation.  Patient has been off cigarettes for about 4 weeks now PFTs today show slight reduction in FVC and diffusion capacity but unreliable as he still has some postoperative pain and was unable to give full effort.  He does have rheumatoid arthritis but no evidence of NSIP fibrosis or RA-ILD.  No evidence of hypersensitivity pneumonitis on pathology Suggest close monitoring.  Hopefully his pulmonary fibrosis will remain stable If there is progression then we may need to reassess Discussed plan in detail with patient today.  Plan/Recommendations: - Smoking cessation - Spirometry, diffusion capacity and 6-minute walk test.  Follow-up with Dr.  [name].  Marshell Garfinkel MD [facility] 01/08/2018, 1:30 PM  CC: [name], MD

## 2018-01-21 LAB — ACID FAST CULTURE WITH REFLEXED SENSITIVITIES: ACID FAST CULTURE - AFSCU3: NEGATIVE

## 2018-01-23 DIAGNOSIS — N200 Calculus of kidney: Secondary | ICD-10-CM

## 2018-01-23 HISTORY — DX: Calculus of kidney: N20.0

## 2018-02-08 ENCOUNTER — Ambulatory Visit (INDEPENDENT_AMBULATORY_CARE_PROVIDER_SITE_OTHER): Payer: 59 | Admitting: *Deleted

## 2018-02-08 DIAGNOSIS — J849 Interstitial pulmonary disease, unspecified: Secondary | ICD-10-CM

## 2018-02-08 NOTE — Progress Notes (Signed)
SIX MIN WALK 02/08/2018 11/22/2017 09/26/2017 03/27/2017  Medications Hydrochlorothiazide 12.5mg , Omega-3 100mg  this morning at 7am. - - -  Supplimental Oxygen during Test? (L/min) No - No No  Laps 12 - - -  Partial Lap (in Meters) 0 - - -  Baseline BP (sitting) 150/98 - - -  Baseline Heartrate 66 - - -  Baseline Dyspnea (Borg Scale) 3 - - -  Baseline Fatigue (Borg Scale) 4 - - -  Baseline SPO2 99 - - -  BP (sitting) 144/100 - - -  Heartrate 75 - - -  Dyspnea (Borg Scale) 4 - - -  Fatigue (Borg Scale) 4 - - -  SPO2 100 - - -  BP (sitting) 140/96 - - -  Heartrate 75 - - -  SPO2 99 - - -  Stopped or Paused before Six Minutes No - - -  Distance Completed 408 - - -  Tech Comments: Patient walked normal pace, denied any SOB or Dizzy feeling or any pain during the walk. kmw Walked a moderate pace, denies sob or chest discomfort. Tolerated well.  Pt walked at a normal to moderate pace completing all required laps. Pt denied any complaints. Pt walked at a moderate pace completing all required laps. Denied any complaints during walk.

## 2018-02-13 ENCOUNTER — Ambulatory Visit: Payer: 59 | Admitting: Internal Medicine

## 2018-03-12 ENCOUNTER — Encounter: Payer: Self-pay | Admitting: Internal Medicine

## 2018-03-12 ENCOUNTER — Ambulatory Visit (INDEPENDENT_AMBULATORY_CARE_PROVIDER_SITE_OTHER): Payer: 59 | Admitting: Internal Medicine

## 2018-03-12 VITALS — BP 138/98 | HR 68 | Ht 70.0 in | Wt 225.0 lb

## 2018-03-12 DIAGNOSIS — J849 Interstitial pulmonary disease, unspecified: Secondary | ICD-10-CM | POA: Diagnosis not present

## 2018-03-12 LAB — PULMONARY FUNCTION TEST
DL/VA % PRED: 119 %
DL/VA: 5.16 ml/min/mmHg/L
DLCO unc % pred: 105 %
DLCO unc: 29.08 ml/min/mmHg
FEF 25-75 Pre: 1.86 L/sec
FEF2575-%Pred-Pre: 60 %
FEV1-%PRED-PRE: 73 %
FEV1-PRE: 2.64 L
FEV1FVC-%Pred-Pre: 94 %
FEV6-%PRED-PRE: 80 %
FEV6-PRE: 3.67 L
FEV6FVC-%PRED-PRE: 103 %
FVC-%PRED-PRE: 77 %
FVC-PRE: 3.69 L
Pre FEV1/FVC ratio: 72 %
Pre FEV6/FVC Ratio: 99 %

## 2018-03-12 NOTE — Progress Notes (Signed)
OV 03/27/2017 - ILD CLINIC  Chief Complaint  Patient presents with  . Consult    Consult per Eric Form, NP for ILD seen on HRCT 03/22/17.  Pt states he has occ. SOB, and sinus problems. Denies any cough or CP.   58 year old male referred for interstitial lung disease.  He is known to have rheumatoid arthritis for a few years and is on Humira.  He is also a heavy smoker.  He underwent screening low-dose CT scan of the chest and of January 2019 and this showed interstitial lung disease.  This was followed up by high-resolution CT chest which has confirmed interstitial lung disease.  According to thoracic radiology it is indeterminate for UIP.  There is also associated emphysema.  Therefore has been referred to interstitial lung disease clinic  American College of chest physicians interstitial lung disease questionnaire  : He has symptoms of cough occasionally but not bothersome for several years.  It is cough at night and it is a dry cough without any sputum.  He has mild shortness of breath when hurrying on level ground or walking up a slight hill.  This is been going on for 2 years without any change.  In terms of his past medical history he has some arthralgia which is due to rheumatoid arthritis and is on Humira.  He has acid reflux disease.  Otherwise past medical history is negative  In terms of personal exposure history: He is a heavy smoker started smoking at age 13.  Smokes 20 cigarettes a day.  He is trying to quit.  He does not want the help of Chantix or Zyban.  He is trying to quit on his own by reducing the number of cigarettes he smokes each day.  He does not use any street drugs.  In terms of family history of lung disease: Positive for COPD.  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  He thinks his father-in-law got this from paint exposure.  There is no pulmonary fibrosis and the blood family  Home  exposure history: Negative for humidifier [place] or hot tub or Jacuzzi O.  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  So he gets both bleach exposure and mold exposure.  He gets exposed at least 4 homes each year for the last 35 years.  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  There were around 40 pigeons and a small 100 square feet room.  He would visit these patients once a week for 2 years.  He would then feed them and pick them up to race.  During this time he got exposed to significant amount of bird feathers and toilet.  Occupational history: As a Curator as above involving bleach, paint fumes and mold  Pulmonary toxicity history: Negative.  Denies any amiodarone or beta really mL cobalt or radiation or chemotherapy or BCG or nitrofurantoin exposure  Walking desaturation test on 03/27/2017 185 feet x 3 laps on ROOM AIR:  did not desaturate. Rest pulse ox was 100%, final pulse ox was 98%. HR response 63/min at rest to 86/min at peak exertion. Patient Robert Greene  Did not Desaturate < 88% .  Robert Greene did not  Desaturated </= 3% points. Robert Greene did not get tachyardic   IMPRESSION: 1. The appearance of the lungs is compatible with interstitial lung disease, with a CT pattern considered indeterminate for UIP (usual interstitial pneumonia). 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 2. Mild diffuse bronchial wall thickening with mild centrilobular and paraseptal emphysema; imaging findings suggestive of underlying COPD. 3. Aortic atherosclerosis, in addition to left anterior  descending coronary artery disease. Please note that although the presence of coronary artery calcium documents the presence of coronary artery disease, the severity of this disease and any potential stenosis cannot be assessed on this non-gated CT examination. Assessment for potential risk factor modification, dietary therapy or pharmacologic therapy may be warranted, if clinically indicated.  Aortic Atherosclerosis (ICD10-I70.0) and Emphysema (ICD10-J43.9).   Electronically Signed   By: Vinnie Langton M.D.   On: 03/22/2017 10:25      OV 09/26/2017  Subjective:  Patient ID: Robert Greene, male , DOB: 10/15/1960 , age MRN: [id number] , ADDRESS: [place] [id number]   09/26/2017 -   Chief Complaint  Patient presents with  . Follow-up    HRCT performed 8/15 and PFT performed today. Pt states he will have occ SOB but denies any real complaints.     HPI Robert Greene 58 y.o. -presents for shortness of breath and ILD evaluation. In the interim he has seen Dr. Harriet Pho. He had a cardiac stress test in March 2019 actually and this was normal with an ejection fraction of 60%. He has seen Dr. Amil Amen of rheumatology 05/08/2017 and I reviewed this note.Diagnosis is polymyalgia rheumatica and seronegative rheumatoid arthritis. He has a history of heavy alcohol use.Marland Kitchen Appears July 2016 rheumatoid factor, CCP, ANA and IgM were negative. In this visit he was supposed to quit smoking and reevaluate. However he has not been able to quit smoking. He continues to spray paint. Continues to be exposed to pigeons. His primary function test ironically is normal as is the walk test but his high resolution CT chest August 2019 that I personally visualized the show ILD. Based on 2018 ATS criteria and mypersonal visualization opinion I think the pattern is indeterminate although the most recent radiologist has called this is probable UIP.     IMPRESSION:  1. Spectrum of findings  suggestive of a mild fibrotic interstitial lung disease with slight basilar gradient and no frank honeycombing. 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 No appreciable progression since 02/14/2017 chest CT. 2. One vessel coronary atherosclerosis. 3. Mild diffuse hepatic steatosis.  Aortic Atherosclerosis (ICD10-I70.0) and Emphysema (ICD10-J43.9).   Electronically Signed   By: Ilona Sorrel M.D.   On: 09/06/2017 10:53   OV 11/22/2017  Subjective:  Patient ID: Robert Greene, male , DOB: 09-Oct-1960 , age 34 y.o. , MRN: [id number] , ADDRESS: [place] [address]   11/22/2017 -   Chief Complaint  Patient presents with  . Follow-up    States his breathing is the same at his baseline. No new concerns.      HPI Robert Greene 58 y.o. -interstitial lung disease not otherwise specified.  Here for follow-up.  This visit was supposed to be after surgical lung biopsy.  However he visited with Dr. Roxan Hockey and decided  to defer the date of the biopsy because he wanted to go on disability.  He now is on disability and feels he is ready to have surgical lung biopsy.  He says he will call Dr. Roxan Hockey and make an appointment.  In the interim he feels he is mildly more short of breath especially before inclines and steps than before.  Otherwise no other new problems.  Immunization record shows that he could benefit from Pneumovax.  He is up-to-date with the flu shot.  Walking desaturation test suggest possible worsening and tachycardia than before       OV 03/12/2018  Subjective:  Patient ID: Robert Greene, male , DOB: 01-Mar-1960 , age 89 y.o. , MRN: [id number] , ADDRESS: [place] [address]   03/12/2018 -   Chief Complaint  Patient presents with  . Follow-up    PFT performed today. Pt states breathing is about the same since last visit and states he has had some chest congestion.    Follow-up biopsy-proven smoking-related  interstitial lung fibrosis #14 2019  HPI Robert Greene 58 y.o. -returns for follow-up.  He says that he has quit smoking now for 4 months.  Overall he feels stable.  He had pulmonary function test today that shows a decline in FVC but stability and DLCO value.  He feels the same.  He does not believe that the [facility] decline is real.  His symptom scores are reported below and minimal.  He is gaining some weight because of quitting smoking.  He is up-to-date with his flu shot.  SYMPTOM SCALE - ILD 03/12/2018   O2 use ra  Shortness of Breath 0 -> 5 scale with 5 being worst (score 6 If unable to do)  At rest 1  Simple tasks - showers, clothes change, eating, shaving 0*  Household (dishes, doing bed, laundry) 1*  Shopping 0  Walking level at own pace 1  Walking keeping up with others of same age 69  Walking up Stairs 2  Walking up Hill 2  Total (40 - 48) Dyspnea Score 8  How bad is your cough? 1  How bad is your fatigue 1        Simple office walk 185 feet x  3 laps goal with forehead probe 09/26/2017  11/22/2017  03/12/2018   O2 used Room air Room air Room air  Number laps completed 3 3 3  x 185 feet  Comments about pace Normal to mod pace  normal  Resting Pulse Ox/HR 99% and 69/min 100% and 73/min 99% and 68/min  Final Pulse Ox/HR 99% and 89/min 100% and 97/min 97% and 88/min  Desaturated </= 88% no no no  Desaturated <= 3% points no no no  Got Tachycardic >/= 90/min No but almost yes no  Symptoms at end of test no x none  Miscellaneous comments x x x    Results for Robert Greene, Robert Greene (MRN [id number]) as of 03/12/2018 10:11  Ref. Range 09/26/2017 09:45 01/04/2018 11:05 03/12/2018 09:01  FVC-Pre Latest Units: L 4.32 3.95 3.69  FVC-%Pred-Pre Latest Units: % 91 83 77   Results for Robert Greene, Robert Greene (MRN [id number]) as of 03/12/2018 10:11  Ref. Range 09/26/2017 09:45 01/04/2018 11:05 03/12/2018 09:01-GL i.e. equation  DLCO unc Latest Units: ml/min/mmHg 30.39 25.65 29.08  DLCO unc % pred  Latest Units: % 97 81 105   ROS - per HPI     has a past medical history of Anxiety, Arthritis, Dyspnea, Hypercholesteremia, Hypercholesteremia, Hypertension,  and Pulmonary fibrosis ([place]).   reports that he quit smoking about 3 months ago. His smoking use included cigarettes. He has a 50.00 pack-year smoking history. He has never used smokeless tobacco.  Past Surgical History:  Procedure Laterality Date  . JOINT REPLACEMENT     x2 scopes not replacement  . LUNG BIOPSY Right 12/06/2017   Procedure: LUNG BIOPSY;  Surgeon: Melrose Nakayama, MD;  Location: [facility];  Service: Thoracic;  Laterality: Right;  Marland Kitchen VIDEO ASSISTED THORACOSCOPY Right 12/06/2017   Procedure: VIDEO ASSISTED THORACOSCOPY;  Surgeon: Melrose Nakayama, MD;  Location: [facility];  Service: Thoracic;  Laterality: Right;    Allergies  Allergen Reactions  . Bee Venom Anaphylaxis, Hives and Rash  . Spider Antivenin [Black Widow Spider Antivenin (L.Mactans)] Anaphylaxis, Hives and Rash    Immunization History  Administered Date(s) Administered  . Influenza,inj,Quad PF,6+ Mos 09/26/2017  . Pneumococcal Conjugate-13 11/22/2017    No family history on file.   Current Outpatient Medications:  .  acetaminophen (TYLENOL) 500 MG tablet, Take 2 tablets (1,000 mg total) by mouth every 6 (six) hours., Disp: 30 tablet, Rfl: 0 .  atorvastatin (LIPITOR) 80 MG tablet, Take 80 mg by mouth daily., Disp: , Rfl: 1 .  diphenhydrAMINE (BENADRYL) 25 MG tablet, Take 25 mg by mouth daily as needed for itching. , Disp: , Rfl:  .  gabapentin (NEURONTIN) 100 MG capsule, Take 100 mg by mouth at bedtime. , Disp: , Rfl:  .  HUMIRA PEN 40 MG/0.8ML PNKT, Inject 40 mg into the skin every 14 (fourteen) days., Disp: , Rfl:  .  hydrochlorothiazide (HYDRODIURIL) 12.5 MG tablet, Take 12.5 mg by mouth daily. , Disp: , Rfl:  .  hydroxychloroquine (PLAQUENIL) 200 MG tablet, Take 200 mg by mouth daily. , Disp: , Rfl: 2 .  losartan (COZAAR) 100 MG  tablet, Take 100 mg by mouth daily., Disp: , Rfl: 5 .  Omega-3 Fatty Acids (FISH OIL PO), Take 2 capsules by mouth 2 (two) times daily. , Disp: , Rfl:  .  EPINEPHrine 0.3 mg/0.3 mL IJ SOAJ injection, Use once.  If ineffective, use 2nd dose. (Patient not taking: Reported on 03/12/2018), Disp: 2 Device, Rfl: 0      Objective:   Vitals:   03/12/18 0929  BP: (!) 138/98  Pulse: 68  SpO2: 96%  Weight: 225 lb (102.1 kg)  Height: 5\' 10"  (1.778 m)    Estimated body mass index is 32.28 kg/m as calculated from the following:   Height as of this encounter: 5\' 10"  (1.778 m).   Weight as of this encounter: 225 lb (102.1 kg).  @WEIGHTCHANGE @  [employer]   03/12/18 0929  Weight: 225 lb (102.1 kg)     Physical Exam  General Appearance:    Alert, cooperative, no distress, appears stated age - es , Deconditioned looking - no , OBESE  - no, Sitting on Wheelchair -  no  Head:    Normocephalic, without obvious abnormality, atraumatic  Eyes:    PERRL, conjunctiva/corneas clear,  Ears:    Normal TM's and external ear canals, both ears  Nose:   Nares normal, septum midline, mucosa normal, no drainage    or sinus tenderness. OXYGEN ON  - no . Patient is @ ra   Throat:   Lips, mucosa, and tongue normal; teeth and gums normal. Cyanosis on lips - no  Neck:   Supple, symmetrical, trachea midline, no adenopathy;    thyroid:  no enlargement/tenderness/nodules; no carotid  bruit or JVD  Back:     Symmetric, no curvature, ROM normal, no CVA tenderness  Lungs:     Distress - no , Wheeze no, Barrell Chest - no, Purse lip breathing - no, Crackles - no   Chest Wall:    No tenderness or deformity.    Heart:    Regular rate and rhythm, S1 and S2 normal, no rub   or gallop, Murmur - no  Breast Exam:    NOT DONE  Abdomen:     Soft, non-tender, bowel sounds active all four quadrants,    no masses, no organomegaly. Visceral obesity - yes  Genitalia:   NOT DONE  Rectal:   NOT DONE  Extremities:    Extremities - normal, Has Cane - no, Clubbing - no, Edema - no  Pulses:   2+ and symmetric all extremities  Skin:   Stigmata of Connective Tissue Disease - no  Lymph nodes:   Cervical, supraclavicular, and axillary nodes normal  Psychiatric:  Neurologic:   Pleasant - yes, Anxious - no, Flat affect - no  CAm-ICU - neg, Alert and Oriented x 3 - yes, Moves all 4s - yes, Speech - normal, Cognition - intact           Assessment:       ICD-10-CM   1. ILD (interstitial lung disease) ([place]) J84.9 Pulmonary function test       Plan:     Patient Instructions     ICD-10-CM   1. ILD (interstitial lung disease) (HCC) J84.9    There is one element of your breathing test that is slowly worse over time but based on walking test and symptom scores everything appears stable  Plan  Glad you quit smoking and this will make a big difference  But please keep in mind that sometimes these fibrotic lung diseases can decide to get worse even if you have quit smoking  In case your lungs decide to get worse in the future we can consider nintedanib anti-fibrotic drug that should be approved in the next few months for progressive fibrosis of the non--IPF variety  I will also consider you for the ILD-Pro research study in the future; take consent form   Do repeat spirometry and diffusion capacity breathing test in 6 months    Follow-up Return to ILD clinic in 6 months but after the breathing test Continue to remain in remission with your smoking Return sooner if you feel your breathing is getting worse I   > 50% of this > 25 min visit spent in face to face counseling or coordination of care - by this undersigned MD - Dr Brand Males. This includes one or more of the following documented above: discussion of test results, diagnostic or treatment recommendations, prognosis, risks and benefits of management options, instructions, education, compliance or risk-factor reduction    SIGNATURE     Dr. Brand Males, M.D., F.C.C.P,  Pulmonary and Critical Care Medicine Staff Physician, [place] Director - Interstitial Lung Disease  Program  Pulmonary [place] [facility] [place], [place], [address]  Pager: [phone], If no answer or between  15:00h - 7:00h: call [phone] Telephone: [phone]  10:30 AM 03/12/2018

## 2018-03-12 NOTE — Patient Instructions (Addendum)
ICD-10-CM   1. ILD (interstitial lung disease) (HCC) J84.9    There is one element of your breathing test that is slowly worse over time but based on walking test and symptom scores everything appears stable  Plan  Glad you quit smoking and this will make a big difference  But please keep in mind that sometimes these fibrotic lung diseases can decide to get worse even if you have quit smoking  In case your lungs decide to get worse in the future we can consider nintedanib anti-fibrotic drug that should be approved in the next few months for progressive fibrosis of the non--IPF variety  I will also consider you for the ILD-Pro research study in the future; take consent form   Do repeat spirometry and diffusion capacity breathing test in 6 months    Follow-up Return to ILD clinic in 6 months but after the breathing test Continue to remain in remission with your smoking Return sooner if you feel your breathing is getting worse I

## 2018-03-12 NOTE — Progress Notes (Signed)
Patient completed pre spiro and dlco only.

## 2018-04-03 ENCOUNTER — Ambulatory Visit (INDEPENDENT_AMBULATORY_CARE_PROVIDER_SITE_OTHER): Payer: 59 | Admitting: Pulmonary Disease

## 2018-04-03 DIAGNOSIS — J849 Interstitial pulmonary disease, unspecified: Secondary | ICD-10-CM

## 2018-04-03 NOTE — Progress Notes (Signed)
   Interstitial Lung Disease Multidisciplinary Conference   Robert Greene    MRN [id number]    DOB [birth year]  Primary Care Physician:Little, Lennette Bihari, MD  Referring Physician: Dr. Brand Males, MD  Time of Conference: 7.30am- 8.30am Date of conference: 04/03/2018 Location of Conference: -  [place]; typically 2nd Tuesday of each month  Participating Pulmonary:  - Dr Marshell Garfinkel, MD Pathology: Dr Jaquita Folds, MD Radiology: Dr Vinnie Langton MD  Brief History:  58 year old heavy smoker with interstitial lung disease.  Has bird, paint exposure.  Underwent surgical lung biopsy on 12/06/2017  MDD discussion of CT scan   - Date or time period of scan: February 2019, August 2019  - Features mentioned:  Lower lobe predominant disease which is asymmetric in appearance > Right greater than left with groundglass opacities, septal thickening, peribronchovascular thickening, traction bronchiectasis.  No honeycombing  - What is the final conclusion per 2018 ATS/Fleischner Criteria -Probable UIP  Pathology discussion of biopsy: Emphysematous changes.  Pigmented macrophages, ropy fibrosis with architectural distortion.  No granulomas.  Stellate scars Findings are consistent with SRIF.  No UIP.  Second opinion obtained from [facility] clinic which agrees with above conclusion.  PFTs:  Mild obstructive airway disease.  Normal diffusion capacity.  MDD Impression/Recs:  Pathology and clinical picture consistent with smoking related interstitial fibrosis (SRIF).  Time Spent in preparation and discussion: 35 mins  Marshell Garfinkel MD [facility] 04/03/2018, 5:29 PM

## 2018-04-05 NOTE — Progress Notes (Signed)
Subjective:   Robert Greene, male    DOB: 05-13-1960, 58 y.o.   MRN: [id number]  [name], MD:  Chief Complaint  Patient presents with   Hypertension   Follow-up    HPI: Robert Greene  is a 58 y.o. male  with known coronary calcification by CT scan, rheumatoid arthritis, hypertension, hyperlipidemia, history of heavy tobacco use, recently diagnosed in January 2019 with interstitial lung disease and pulmonary fibrosis followed by Dr. [name]. Underwent exercise stress testing on 04/16/2017 that was considered low risk study. Echocardiogram on 05/10/2017 showed mild LVH, otherwise normal. No evidence of pulmonary hypertension.   Underwent Surgical lung biopsy on 12/06/2017 revealing chronic interstitial lung disease with emphysema, respiratory bronchiolitis and smoking-related interstitial fibrosis.  He is now here on 6 month office visit.   Past Medical History:  Diagnosis Date   Anxiety    Arthritis    rheumatoid   Coronary artery calcification seen on CAT scan 04/08/2018   Dyspnea    HTN (hypertension) 04/08/2018   Hypercholesteremia    Hypercholesteremia    Hypertension    Pulmonary fibrosis ([place])     Past Surgical History:  Procedure Laterality Date   JOINT REPLACEMENT     x2 scopes not replacement   LUNG BIOPSY Right 12/06/2017   Procedure: LUNG BIOPSY;  Surgeon: Melrose Nakayama, MD;  Location: [facility];  Service: Thoracic;  Laterality: Right;   VIDEO ASSISTED THORACOSCOPY Right 12/06/2017   Procedure: VIDEO ASSISTED THORACOSCOPY;  Surgeon: Melrose Nakayama, MD;  Location: [facility];  Service: Thoracic;  Laterality: Right;    History reviewed. No pertinent family history.  Social History   Socioeconomic History   Marital status: Married    Spouse name: Not on file   Number of children: 2   Years of education: Not on file   Highest education level: Not on file  Occupational History   Not on file  Social Needs    Financial resource strain: Not on file   Food insecurity:    Worry: Not on file    Inability: Not on file   Transportation needs:    Medical: Not on file    Non-medical: Not on file  Tobacco Use   Smoking status: Former Smoker    Packs/day: 1.25    Years: 40.00    Pack years: 50.00    Types: Cigarettes    Last attempt to quit: 12/05/2017    Years since quitting: 0.3   Smokeless tobacco: Never Used   Tobacco comment: 5 cigarettes per day 11/22/2017  Substance and Sexual Activity   Alcohol use: Yes    Alcohol/week: 1.0 standard drinks    Types: 1 Standard drinks or equivalent per week    Comment: daily   Drug use: No   Sexual activity: Yes    Birth control/protection: None  Lifestyle   Physical activity:    Days per week: Not on file    Minutes per session: Not on file   Stress: Not on file  Relationships   Social connections:    Talks on phone: Not on file    Gets together: Not on file    Attends religious service: Not on file    Active member of club or organization: Not on file    Attends meetings of clubs or organizations: Not on file    Relationship status: Not on file   Intimate partner violence:    Fear of current or ex partner: Not  on file    Emotionally abused: Not on file    Physically abused: Not on file    Forced sexual activity: Not on file  Other Topics Concern   Not on file  Social History Narrative   Not on file    Current Meds  Medication Sig   atorvastatin (LIPITOR) 80 MG tablet Take 80 mg by mouth daily.   diphenhydrAMINE (BENADRYL) 25 MG tablet Take 25 mg by mouth daily as needed for itching.    EPINEPHrine 0.3 mg/0.3 mL IJ SOAJ injection Use once.  If ineffective, use 2nd dose.   gabapentin (NEURONTIN) 100 MG capsule Take 100 mg by mouth daily as needed.    HUMIRA PEN 40 MG/0.8ML PNKT Inject 40 mg into the skin every 14 (fourteen) days.   hydrochlorothiazide (HYDRODIURIL) 12.5 MG tablet Take 12.5 mg by mouth daily.     losartan (COZAAR) 100 MG tablet Take 100 mg by mouth daily.   Omega-3 Fatty Acids (FISH OIL PO) Take 2 capsules by mouth 2 (two) times daily.      Review of Systems  Constitution: Negative for decreased appetite, malaise/fatigue, weight gain and weight loss.  HENT: Positive for hoarse voice (chronic).   Eyes: Negative for visual disturbance.  Cardiovascular: Positive for dyspnea on exertion and leg swelling (bilateral knees; RA). Negative for chest pain, claudication, orthopnea, palpitations and syncope.  Respiratory: Negative for hemoptysis and wheezing.   Endocrine: Negative for cold intolerance and heat intolerance.  Hematologic/Lymphatic: Does not bruise/bleed easily.  Skin: Negative for nail changes.  Musculoskeletal: Negative for muscle weakness and myalgias.  Gastrointestinal: Negative for abdominal pain, change in bowel habit, nausea and vomiting.  Neurological: Negative for difficulty with concentration, dizziness, focal weakness and headaches.  Psychiatric/Behavioral: Negative for altered mental status and suicidal ideas.  All other systems reviewed and are negative.      Objective:     Blood pressure (!) 141/90, pulse 70, height _0  (1.702 m), weight 227 lb 6.4 oz (103.1 kg), SpO2 97 %.  Cardiac studies:  EKG 04/08/2018: Probable sinus rhythm vs. ectopic atrial rhythm at 66 bpm, normal axis, early R wave provression. No evidence of ischemia.  EKG 10/05/2017: Sinus bradycardia at 57 bpm, normal axis, early repolarization. No evidence of ischemia. No change compared to EKG 03/2017.  CT scan of chest 09/06/2017: 1. Spectrum of findings suggestive of a mild fibrotic interstitial lung disease with slight basilar gradient and no frank honeycombing. 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  No appreciable progression since 02/14/2017 chest CT. 2. One vessel coronary atherosclerosis. 3. Mild diffuse hepatic steatosis.  Echocardiogram 05/10/2017:  Left ventricle cavity is normal in size. Mild concentric hypertrophy of the left ventricle. Normal global wall motion. Normal diastolic filling pattern. Calculated EF 60%. No significant valvular abnormality. Inadequate tricuspid regurgitation jet to estimate pulmonary artery pressure. Normal right atrial pressure.  Exercise sestamibi stress test 04/16/2017: 1. The patient performed treadmill exercise using a Bruce protocol, completing 6:44 minutes. The patient completed an estimated workload of 8.09 METS, reaching 89% of the maximum predicted heart rate. Normal resting blood pressure with mildly exaggerated exercise response, peak BP 210/70 mmHg. The patient did not develop symptoms other than fatigue during the procedure. Good exercise capacity. Stress electrocardiogram showed no ischemic changes. 2. The overall quality of the study is excellent. There is no evidence of abnormal lung activity. Stress and rest SPECT images demonstrate homogeneous tracer distribution throughout the myocardium. Gated SPECT imaging reveals normal myocardial thickening and wall  motion. The left ventricular ejection fraction was normal (60%). 3. Low risk study.  CT scan of chest 03/22/2017: 1. The appearance of the lungs is compatible with interstitial lung disease, with a CT pattern considered indeterminate for UIP (usual interstitial pneumonia). 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 2. Mild diffuse bronchial wall thickening with mild centrilobular and paraseptal emphysema; imaging findings suggestive of underlying COPD. 3. Aortic atherosclerosis, in addition to left anterior descending coronary artery disease. Please note that although the presence of coronary artery calcium documents the presence of coronary artery  disease, the severity of this disease and any potential stenosis cannot be assessed on this non-gated CT examination.  US Carotid Bilateral 02/14/2017: Moderate bilateral carotid atherosclerosis. No hemodynamically significant ICA stenosis. Degree of narrowing less than 50% bilaterally by ultrasound criteria.  Recent Labs:    CMP Latest Ref Rng & Units 12/08/2017  Glucose 70 - 99 mg/dL 133(H)  BUN 6 - 20 mg/dL 14  Creatinine 0.61 - 1.24 mg/dL 0.83  Sodium 135 - 145 mmol/L 135  Potassium 3.5 - 5.1 mmol/L 3.6  Chloride 98 - 111 mmol/L 102  CO2 22 - 32 mmol/L 27  Calcium 8.9 - 10.3 mg/dL 8.6(L)  Total Protein 6.5 - 8.1 g/dL 6.1(L)  Total Bilirubin 0.3 - 1.2 mg/dL 0.9  Alkaline Phos 38 - 126 U/L 54  AST 15 - 41 U/L 30  ALT 0 - 44 U/L 31  eGFR non Af Amer >70m/min >60   CBC Latest Ref Rng & Units 12/08/2017  WBC 4.0 - 10.5 K/uL 13.1(H)  Hemoglobin 13.0 - 17.0 g/dL 13.4  Hematocrit 39.0 - 52.0 % 42.3  Platelets 150 - 400 K/uL 155   08/28/2017: Hepatitis panel negative. 05/24/2017: Cholesterol 181, triglycerides 73, HDL 57, LDL 109. 02/08/2017: Hemoglobin A1c 6%. TSH normal.     Physical Exam  Constitutional: He appears well-developed and well-nourished. No distress.  HENT:  Head: Atraumatic.  Eyes: Conjunctivae are normal.  Neck: Neck supple. No JVD present. No thyromegaly present.  Cardiovascular: Normal rate, regular rhythm, normal heart sounds and intact distal pulses. Exam reveals no gallop.  No murmur heard. Pulmonary/Chest: Effort normal and breath sounds normal.  Abdominal: Soft. Bowel sounds are normal.  Musculoskeletal: Normal range of motion.        General: No edema.  Neurological: He is alert.  Skin: Skin is warm and dry.  Psychiatric: He has a normal mood and affect.          Assessment & Recommendations:  1. Essential hypertension Elevated today, but is found to be out of amlodipine. Will refill this today. He will continue to monitor at home as well.    2. Coronary artery calcification seen on CAT scan Negative nuclear stress test. No symptoms of angina. Has shortness of breath from ILD that is stable. Recommended continued aggressive medical management.  3. Mixed hyperlipidemia On high dose Lipitor and Zetia. Will obtain lipids and CMP today for follow up.  4. ILD (interstitial lung disease) ([place] Being followed by Dr. RChase Caller Found to have ILD from tobacco use. Shortness of breath has been stable. He is currently debating starting research study and will further discuss with pulmonary.   5. Former smoker Recently quit smoking a few months ago. I have congratulated him on this. Encouraged continued complete smoking cessation.  Advised him to be careful of weight gain. Has gained approximately 10 lbs since quitting.   6. Laboratory examination 05/24/2017: Cholesterol 181, triglycerides 73, HDL 57, LDL 109.   Plan: Overall patient is doing well without any worsening symptoms. Recommend continued risk factor modification. Will continue with annual follow ups, but encouraged sooner follow up if needed.   Jeri Lager, MSN, APRN, FNP-C [facility], [place] Office: [phone] Fax: [phone]

## 2018-04-08 ENCOUNTER — Other Ambulatory Visit: Payer: Self-pay

## 2018-04-08 ENCOUNTER — Ambulatory Visit: Payer: Self-pay | Admitting: Cardiology

## 2018-04-08 ENCOUNTER — Encounter: Payer: Self-pay | Admitting: Cardiology

## 2018-04-08 VITALS — BP 141/90 | HR 70 | Ht 67.0 in | Wt 227.4 lb

## 2018-04-08 DIAGNOSIS — J849 Interstitial pulmonary disease, unspecified: Secondary | ICD-10-CM

## 2018-04-08 DIAGNOSIS — Z87891 Personal history of nicotine dependence: Secondary | ICD-10-CM

## 2018-04-08 DIAGNOSIS — I2510 Atherosclerotic heart disease of native coronary artery without angina pectoris: Secondary | ICD-10-CM

## 2018-04-08 DIAGNOSIS — E782 Mixed hyperlipidemia: Secondary | ICD-10-CM | POA: Insufficient documentation

## 2018-04-08 DIAGNOSIS — Z0189 Encounter for other specified special examinations: Secondary | ICD-10-CM | POA: Insufficient documentation

## 2018-04-08 DIAGNOSIS — I10 Essential (primary) hypertension: Secondary | ICD-10-CM | POA: Insufficient documentation

## 2018-04-08 HISTORY — DX: Essential (primary) hypertension: I10

## 2018-04-08 HISTORY — DX: Atherosclerotic heart disease of native coronary artery without angina pectoris: I25.10

## 2018-04-08 MED ORDER — AMLODIPINE BESYLATE 5 MG PO TABS: 5.0000 mg | ORAL_TABLET | Freq: Every day | ORAL | 3 refills | Status: DC

## 2018-04-09 ENCOUNTER — Other Ambulatory Visit: Payer: Self-pay | Admitting: Cardiology

## 2018-04-10 LAB — COMPREHENSIVE METABOLIC PANEL
ALK PHOS: 99 IU/L (ref 39–117)
ALT: 88 IU/L — ABNORMAL HIGH (ref 0–44)
AST: 82 IU/L — AB (ref 0–40)
Albumin/Globulin Ratio: 1.8 (ref 1.2–2.2)
Albumin: 4.6 g/dL (ref 3.8–4.9)
BILIRUBIN TOTAL: 0.3 mg/dL (ref 0.0–1.2)
BUN/Creatinine Ratio: 12 (ref 9–20)
BUN: 9 mg/dL (ref 6–24)
CO2: 25 mmol/L (ref 20–29)
Calcium: 9.5 mg/dL (ref 8.7–10.2)
Chloride: 99 mmol/L (ref 96–106)
Creatinine, Ser: 0.75 mg/dL — ABNORMAL LOW (ref 0.76–1.27)
GFR calc Af Amer: 118 mL/min/{1.73_m2} (ref >59)
GFR, EST NON AFRICAN AMERICAN: 102 mL/min/{1.73_m2} (ref >59)
GLOBULIN, TOTAL: 2.5 g/dL (ref 1.5–4.5)
Glucose: 149 mg/dL — ABNORMAL HIGH (ref 65–99)
POTASSIUM: 4.2 mmol/L (ref 3.5–5.2)
SODIUM: 142 mmol/L (ref 134–144)
Total Protein: 7.1 g/dL (ref 6.0–8.5)

## 2018-07-30 ENCOUNTER — Other Ambulatory Visit: Payer: Self-pay

## 2018-07-30 MED ORDER — HYDROCHLOROTHIAZIDE 12.5 MG PO TABS: 12.5000 mg | ORAL_TABLET | Freq: Every day | ORAL | 1 refills | Status: DC

## 2018-08-09 ENCOUNTER — Other Ambulatory Visit: Payer: Self-pay

## 2018-08-09 MED ORDER — LOSARTAN POTASSIUM 100 MG PO TABS: 100.0000 mg | ORAL_TABLET | Freq: Every day | ORAL | 1 refills | Status: DC

## 2018-10-15 ENCOUNTER — Ambulatory Visit: Payer: PRIVATE HEALTH INSURANCE | Admitting: Cardiology

## 2018-11-08 ENCOUNTER — Ambulatory Visit: Payer: 59 | Admitting: Cardiology

## 2018-11-11 ENCOUNTER — Other Ambulatory Visit: Payer: Self-pay | Admitting: *Deleted

## 2018-11-11 DIAGNOSIS — F17210 Nicotine dependence, cigarettes, uncomplicated: Secondary | ICD-10-CM

## 2018-11-11 DIAGNOSIS — Z122 Encounter for screening for malignant neoplasm of respiratory organs: Secondary | ICD-10-CM

## 2018-11-27 ENCOUNTER — Telehealth (INDEPENDENT_AMBULATORY_CARE_PROVIDER_SITE_OTHER): Payer: PRIVATE HEALTH INSURANCE | Admitting: Cardiology

## 2018-11-27 ENCOUNTER — Other Ambulatory Visit: Payer: Self-pay

## 2018-11-27 ENCOUNTER — Encounter: Payer: Self-pay | Admitting: Cardiology

## 2018-11-27 ENCOUNTER — Telehealth: Payer: Self-pay

## 2018-11-27 VITALS — BP 130/76 | HR 107 | Ht 70.0 in | Wt 222.0 lb

## 2018-11-27 DIAGNOSIS — I2510 Atherosclerotic heart disease of native coronary artery without angina pectoris: Secondary | ICD-10-CM | POA: Diagnosis not present

## 2018-11-27 DIAGNOSIS — Z720 Tobacco use: Secondary | ICD-10-CM | POA: Diagnosis not present

## 2018-11-27 DIAGNOSIS — I10 Essential (primary) hypertension: Secondary | ICD-10-CM

## 2018-11-27 DIAGNOSIS — E782 Mixed hyperlipidemia: Secondary | ICD-10-CM

## 2018-11-27 NOTE — Progress Notes (Signed)
Primary Physician:  [name], MD   Patient ID: Robert Greene, male    DOB: 07-05-1960, 58 y.o.   MRN: [id number]  Subjective:    Chief Complaint  Patient presents with  . Hypertension  . Hyperlipidemia  . Follow-up   This visit type was conducted due to national recommendations for restrictions regarding the COVID-19 Pandemic (e.g. social distancing).  This format is felt to be most appropriate for this patient at this time.  All issues noted in this document were discussed and addressed.  No physical exam was performed (except for noted visual exam findings with Telehealth visits).  The patient has consented to conduct a Telehealth visit and understands insurance will be billed.   I discussed the limitations of evaluation and management by telemedicine and the availability of in person appointments. The patient expressed understanding and agreed to proceed.  Virtual Visit via Video Note is as below  I connected with Robert Greene, on 11/27/18 at 0900 by telephone and verified that I am speaking with the correct person using two identifiers. Unable to perform video visit as patient did not have equipment.    I have discussed with the patient regarding the safety during COVID Pandemic and steps and precautions including social distancing with the patient.    HPI: Robert Greene  is a 58 y.o. male  with known coronary calcification by CT scan, rheumatoid arthritis, hypertension, hyperlipidemia, history of heavy tobacco use, recently diagnosed in January 2019 with interstitial lung disease and pulmonary fibrosis followed by Dr. [name]. Underwent exercise stress testing on 04/16/2017 that was considered low risk study. Echocardiogram on 05/10/2017 showed mild LVH, otherwise normal. No evidence of pulmonary hypertension.   Underwent Surgical lung biopsy on 12/06/2017 revealing chronic interstitial lung disease with emphysema, respiratory bronchiolitis and smoking-related  interstitial fibrosis.  This is a 6 month virtual visit for HTN and HLD. He is doing well without any complaints. Dyspnea on exertion is unchanged, only if he over exerts himself. No chest pain. He has been changed from Humira to Enbrel due to worsening arthritis, but does feel that his symptoms have improved.   He states that he is due for physical with PCP, he has not had recent labs. Blood pressure has been well controlled.  Past Medical History:  Diagnosis Date  . Anxiety   . Arthritis    rheumatoid  . Coronary artery calcification seen on CAT scan 04/08/2018  . Dyspnea   . HTN (hypertension) 04/08/2018  . Hypercholesteremia   . Hypercholesteremia   . Hypertension   . Pulmonary fibrosis ([place])     Past Surgical History:  Procedure Laterality Date  . JOINT REPLACEMENT     x2 scopes not replacement  . LUNG BIOPSY Right 12/06/2017   Procedure: LUNG BIOPSY;  Surgeon: Melrose Nakayama, MD;  Location: [facility];  Service: Thoracic;  Laterality: Right;  Marland Kitchen VIDEO ASSISTED THORACOSCOPY Right 12/06/2017   Procedure: VIDEO ASSISTED THORACOSCOPY;  Surgeon: Melrose Nakayama, MD;  Location: [facility];  Service: Thoracic;  Laterality: Right;    Social History   Socioeconomic History  . Marital status: Married    Spouse name: Not on file  . Number of children: 2  . Years of education: Not on file  . Highest education level: Not on file  Occupational History  . Not on file  Social Needs  . Financial resource strain: Not on file  . Food insecurity    Worry: Not on file  Inability: Not on file  . Transportation needs    Medical: Not on file    Non-medical: Not on file  Tobacco Use  . Smoking status: Current Every Day Smoker    Years: 40.00    Types: Cigarettes    Last attempt to quit: 12/05/2017    Years since quitting: 0.9  . Smokeless tobacco: Never Used  . Tobacco comment: 2 cigarettes daily   Substance and Sexual Activity  . Alcohol use: Yes    Alcohol/week: 1.0  standard drinks    Types: 1 Standard drinks or equivalent per week    Comment: daily  . Drug use: No  . Sexual activity: Yes    Birth control/protection: None  Lifestyle  . Physical activity    Days per week: Not on file    Minutes per session: Not on file  . Stress: Not on file  Relationships  . Social Herbalist on phone: Not on file    Gets together: Not on file    Attends religious service: Not on file    Active member of club or organization: Not on file    Attends meetings of clubs or organizations: Not on file    Relationship status: Not on file  . Intimate partner violence    Fear of current or ex partner: Not on file    Emotionally abused: Not on file    Physically abused: Not on file    Forced sexual activity: Not on file  Other Topics Concern  . Not on file  Social History Narrative  . Not on file    Review of Systems  Constitution: Negative for decreased appetite, malaise/fatigue, weight gain and weight loss.  HENT: Positive for hoarse voice (chronic).   Eyes: Negative for visual disturbance.  Cardiovascular: Positive for dyspnea on exertion and leg swelling (bilateral knees; RA). Negative for chest pain, claudication, orthopnea, palpitations and syncope.  Respiratory: Negative for hemoptysis and wheezing.   Endocrine: Negative for cold intolerance and heat intolerance.  Hematologic/Lymphatic: Does not bruise/bleed easily.  Skin: Negative for nail changes.  Musculoskeletal: Negative for muscle weakness and myalgias.  Gastrointestinal: Negative for abdominal pain, change in bowel habit, nausea and vomiting.  Neurological: Negative for difficulty with concentration, dizziness, focal weakness and headaches.  Psychiatric/Behavioral: Negative for altered mental status and suicidal ideas.  All other systems reviewed and are negative.     Objective:  Blood pressure 130/76, pulse (!) 107, height 5\' 10"  (1.778 m), weight 222 lb (100.7 kg). Body mass index  is 31.85 kg/m.   Physical exam not performed or limited due to virtual visit.    Please see exam details from prior visit is as below.     Physical Exam  Constitutional: He is oriented to person, place, and time. He appears well-developed and well-nourished. No distress.  HENT:  Head: Normocephalic and atraumatic.  Eyes: Conjunctivae are normal.  Neck: Normal range of motion. Neck supple. No JVD present. No thyromegaly present.  Cardiovascular: Normal rate, regular rhythm, normal heart sounds and intact distal pulses. Exam reveals no gallop.  No murmur heard. Pulmonary/Chest: Effort normal and breath sounds normal.  Abdominal: Soft. Bowel sounds are normal.  Musculoskeletal: Normal range of motion.        General: No edema.  Neurological: He is alert and oriented to person, place, and time.  Skin: Skin is warm and dry.  Psychiatric: He has a normal mood and affect. His behavior is normal.  Radiology: CT scan of chest 09/06/2017: 1. Spectrum of findings suggestive of a mild fibrotic interstitial lung disease with slight basilar gradient and no frank honeycombing. 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  No appreciable progression since 02/14/2017 chest CT. 2. One vessel coronary atherosclerosis. 3. Mild diffuse hepatic steatosis.  Laboratory examination:   05/24/2017: Cholesterol 181, triglycerides 73, HDL 57, LDL 109.  CMP Latest Ref Rng & Units 04/09/2018 12/08/2017 12/07/2017  Glucose 65 - 99 mg/dL 149(H) 133(H) 205(H)  BUN 6 - 24 mg/dL 9 14 12   Creatinine 0.76 - 1.27 mg/dL 0.75(L) 0.83 1.00  Sodium 134 - 144 mmol/L 142 135 134(L)  Potassium 3.5 - 5.2 mmol/L 4.2 3.6 3.7  Chloride 96 - 106 mmol/L 99 102 102  CO2 20 - 29 mmol/L 25 27 22   Calcium 8.7 - 10.2 mg/dL 9.5 8.6(L) 8.9  Total Protein 6.0 - 8.5 g/dL 7.1 6.1(L) -  Total Bilirubin 0.0 - 1.2 mg/dL 0.3 0.9 -  Alkaline Phos 39 - 117 IU/L 99 54 -  AST 0 - 40 IU/L 82(H) 30 -  ALT 0 - 44 IU/L  88(H) 31 -   CBC Latest Ref Rng & Units 12/08/2017 12/07/2017 12/05/2017  WBC 4.0 - 10.5 K/uL 13.1(H) 16.4(H) 7.4  Hemoglobin 13.0 - 17.0 g/dL 13.4 14.5 15.2  Hematocrit 39.0 - 52.0 % 42.3 43.3 48.6  Platelets 150 - 400 K/uL 155 164 188   Lipid Panel  No results found for: CHOL, TRIG, HDL, CHOLHDL, VLDL, LDLCALC, LDLDIRECT HEMOGLOBIN A1C No results found for: HGBA1C, MPG TSH No results for input(s): TSH in the last 8760 hours.  PRN Meds:. Medications Discontinued During This Encounter  Medication Reason  . acetaminophen (TYLENOL) 500 MG tablet   . gabapentin (NEURONTIN) 100 MG capsule   . HUMIRA PEN 40 MG/0.8ML PNKT    Current Meds  Medication Sig  . amLODipine (NORVASC) 5 MG tablet Take 1 tablet (5 mg total) by mouth daily.  Marland Kitchen atorvastatin (LIPITOR) 80 MG tablet Take 80 mg by mouth daily.  . diphenhydrAMINE (BENADRYL) 25 MG tablet Take 25 mg by mouth daily as needed for itching.   Marland Kitchen EPINEPHrine 0.3 mg/0.3 mL IJ SOAJ injection Use once.  If ineffective, use 2nd dose.  . etanercept (ENBREL) 50 MG/ML injection Inject 50 mg into the skin once a week.  . hydrochlorothiazide (HYDRODIURIL) 12.5 MG tablet Take 1 tablet (12.5 mg total) by mouth daily.  . hydroxychloroquine (PLAQUENIL) 200 MG tablet Take 200 mg by mouth daily.   Marland Kitchen losartan (COZAAR) 100 MG tablet Take 1 tablet (100 mg total) by mouth daily.  . Naproxen Sodium 220 MG CAPS Take 1 tablet by mouth as needed.  . Omega-3 Fatty Acids (FISH OIL PO) Take 2 capsules by mouth 2 (two) times daily.     Cardiac Studies:   Echocardiogram 05/10/2017: Left ventricle cavity is normal in size. Mild concentric hypertrophy of the left ventricle. Normal global wall motion. Normal diastolic filling pattern. Calculated EF 60%. No significant valvular abnormality. Inadequate tricuspid regurgitation jet to estimate pulmonary artery pressure. Normal right atrial pressure.  Exercise sestamibi stress test 04/16/2017: 1. The patient performed  treadmill exercise using a Bruce protocol, completing 6:44 minutes. The patient completed an estimated workload of 8.09 METS, reaching 89% of the maximum predicted heart rate. Normal resting blood pressure with mildly exaggerated exercise response, peak BP 210/70 mmHg. The patient did not develop symptoms other than fatigue during the procedure. Good exercise capacity. Stress electrocardiogram showed no ischemic  changes. 2. The overall quality of the study is excellent. There is no evidence of abnormal lung activity. Stress and rest SPECT images demonstrate homogeneous tracer distribution throughout the myocardium. Gated SPECT imaging reveals normal myocardial thickening and wall motion. The left ventricular ejection fraction was normal (60%). 3. Low risk study.  US Carotid Bilateral 02/14/2017: Moderate bilateral carotid atherosclerosis. No hemodynamically significant ICA stenosis. Degree of narrowing less than 50% bilaterally by ultrasound criteria.  Assessment:   Coronary artery calcification seen on CAT scan  Mixed hyperlipidemia  Essential hypertension  Current tobacco use  EKG 04/08/2018: Probable sinus rhythm vs. ectopic atrial rhythm at 66 bpm, normal axis, early R wave provression. No evidence of ischemia.  Recommendations:   This is a 19-month virtual visit for follow-up on hypertension, hyperlipidemia, and coronary artery calcification seen on CT scan.  Patient is doing well without any complaints.  Has chronic dyspnea on exertion related to interstitial lung disease and tobacco use.  No exertional chest pain.  He has remained active despite the pandemic and tolerates as well.  Blood pressure remains well controlled on current medical therapy, will continue the same.  He has not had recent lipid profile testing.  He is due for annual physical with his PCP.  He will call to schedule an appointment in have lipids checked at that time and then will send a copy to me for evaluation.  He is on  Lipitor for management of this, he was previously on Zetia but is unsure as to what happened to this prescription.  We will hold off on restarting until we know baseline lipids.  Unfortunately, patient had quit smoking for 6 months but has recently resumed smoking 2 cigarettes/day.  We have discussed the importance of again working on complete smoking cessation.  He appears motivated and will work on this.  I will plan to see him back in 6 months or sooner if needed.  Miquel Dunn, MSN, APRN, FNP-C [facility]. [place] Office: [phone] Fax: [phone]

## 2018-12-06 ENCOUNTER — Other Ambulatory Visit: Payer: Self-pay

## 2018-12-06 ENCOUNTER — Ambulatory Visit (INDEPENDENT_AMBULATORY_CARE_PROVIDER_SITE_OTHER)
Admission: RE | Admit: 2018-12-06 | Discharge: 2018-12-06 | Disposition: A | Discharge status: Home / Self Care | Payer: PRIVATE HEALTH INSURANCE | Source: Ambulatory Visit | Attending: Acute Care | Admitting: Acute Care

## 2018-12-06 DIAGNOSIS — Z87891 Personal history of nicotine dependence: Secondary | ICD-10-CM

## 2018-12-06 DIAGNOSIS — Z122 Encounter for screening for malignant neoplasm of respiratory organs: Secondary | ICD-10-CM

## 2018-12-06 DIAGNOSIS — F17210 Nicotine dependence, cigarettes, uncomplicated: Secondary | ICD-10-CM

## 2018-12-17 ENCOUNTER — Other Ambulatory Visit: Payer: Self-pay | Admitting: *Deleted

## 2018-12-17 DIAGNOSIS — Z122 Encounter for screening for malignant neoplasm of respiratory organs: Secondary | ICD-10-CM

## 2018-12-17 DIAGNOSIS — F17210 Nicotine dependence, cigarettes, uncomplicated: Secondary | ICD-10-CM

## 2018-12-18 ENCOUNTER — Telehealth: Payer: Self-pay | Admitting: Internal Medicine

## 2018-12-18 NOTE — Telephone Encounter (Signed)
Spoke with pt, advised him we called him about the CT scan results. He already has the results and nothing further is needed.

## 2019-02-05 ENCOUNTER — Other Ambulatory Visit: Payer: Self-pay | Admitting: Cardiology

## 2019-03-14 ENCOUNTER — Telehealth: Payer: Self-pay | Admitting: Internal Medicine

## 2019-03-14 DIAGNOSIS — J849 Interstitial pulmonary disease, unspecified: Secondary | ICD-10-CM

## 2019-03-14 NOTE — Telephone Encounter (Signed)
Robert Greene was last seen in January 2020 with a plan to come back in 6 months with this has not happened because of the pandemic.  I just reviewed the note from rheumatology [facility] rheumatology December 05, 2018.  He is on rheumatoid arthritis treatment with TNF alpha blockade.  He also is a diagnosis of polymyalgia rheumatica.  He has smoking-related ILD  Plan -Do spirometry and DLCO appointment in April 2021 on May 2021 -Given appointment to see me 15 minutes slot ILD clinic or any other clinic 15 minutes but after the spirometry.

## 2019-03-18 NOTE — Telephone Encounter (Signed)
Order placed for PFT and recall placed for pt's appts to be scheduled May. Nothing further needed.

## 2019-04-11 ENCOUNTER — Other Ambulatory Visit: Payer: Self-pay | Admitting: Cardiology

## 2019-06-13 ENCOUNTER — Other Ambulatory Visit (HOSPITAL_COMMUNITY)
Admission: RE | Admit: 2019-06-13 | Discharge: 2019-06-13 | Disposition: A | Discharge status: Home / Self Care | Payer: 59 | Source: Ambulatory Visit | Attending: Cardiology | Admitting: Cardiology

## 2019-06-13 DIAGNOSIS — Z01812 Encounter for preprocedural laboratory examination: Secondary | ICD-10-CM | POA: Diagnosis present

## 2019-06-13 DIAGNOSIS — Z20822 Contact with and (suspected) exposure to covid-19: Secondary | ICD-10-CM | POA: Diagnosis not present

## 2019-06-13 LAB — SARS CORONAVIRUS 2 (TAT 6-24 HRS): SARS Coronavirus 2: NEGATIVE

## 2019-06-16 ENCOUNTER — Other Ambulatory Visit: Payer: Self-pay

## 2019-06-16 ENCOUNTER — Ambulatory Visit (INDEPENDENT_AMBULATORY_CARE_PROVIDER_SITE_OTHER): Payer: 59 | Admitting: Internal Medicine

## 2019-06-16 DIAGNOSIS — J849 Interstitial pulmonary disease, unspecified: Secondary | ICD-10-CM | POA: Diagnosis not present

## 2019-06-16 LAB — PULMONARY FUNCTION TEST
DL/VA % pred: 113 %
DL/VA: 4.83 ml/min/mmHg/L
DLCO cor % pred: 103 %
DLCO cor: 28.02 ml/min/mmHg
DLCO unc % pred: 103 %
DLCO unc: 28.02 ml/min/mmHg
FEF 25-75 Pre: 2 L/sec
FEF2575-%Pred-Pre: 67 %
FEV1-%Pred-Pre: 83 %
FEV1-Pre: 2.96 L
FEV1FVC-%Pred-Pre: 94 %
FEV6-%Pred-Pre: 92 %
FEV6-Pre: 4.12 L
FEV6FVC-%Pred-Pre: 104 %
FVC-%Pred-Pre: 88 %
FVC-Pre: 4.14 L
Pre FEV1/FVC ratio: 71 %
Pre FEV6/FVC Ratio: 100 %

## 2019-06-16 NOTE — Progress Notes (Signed)
Spiro/DLCO performed today. 

## 2019-06-26 ENCOUNTER — Other Ambulatory Visit: Payer: Self-pay

## 2019-06-26 ENCOUNTER — Ambulatory Visit (INDEPENDENT_AMBULATORY_CARE_PROVIDER_SITE_OTHER): Payer: 59 | Admitting: Internal Medicine

## 2019-06-26 ENCOUNTER — Encounter: Payer: Self-pay | Admitting: Internal Medicine

## 2019-06-26 VITALS — BP 120/70 | HR 94 | Temp 97.9°F | Ht 70.0 in | Wt 216.6 lb

## 2019-06-26 DIAGNOSIS — Z7189 Other specified counseling: Secondary | ICD-10-CM | POA: Diagnosis not present

## 2019-06-26 DIAGNOSIS — J849 Interstitial pulmonary disease, unspecified: Secondary | ICD-10-CM

## 2019-06-26 DIAGNOSIS — Z7185 Encounter for immunization safety counseling: Secondary | ICD-10-CM

## 2019-06-26 DIAGNOSIS — F17210 Nicotine dependence, cigarettes, uncomplicated: Secondary | ICD-10-CM

## 2019-06-26 NOTE — Progress Notes (Signed)
OV 03/27/2017 - ILD CLINIC  Chief Complaint  Patient presents with  . Consult    Consult per Robert Form, NP for ILD seen on HRCT 03/22/17.  Pt states he has occ. SOB, and sinus problems. Denies any cough or CP.   59 year old male referred for interstitial lung disease.  He is known to have rheumatoid arthritis for a few years and is on Humira.  He is also a heavy smoker.  He underwent screening low-dose CT scan of the chest and of January 2019 and this showed interstitial lung disease.  This was followed up by high-resolution CT chest which has confirmed interstitial lung disease.  According to thoracic radiology it is indeterminate for UIP.  There is also associated emphysema.  Therefore has been referred to interstitial lung disease clinic  [employer] College of chest physicians interstitial lung disease questionnaire  : He has symptoms of cough occasionally but not bothersome for several years.  It is cough at night and it is a dry cough without any sputum.  He has mild shortness of breath when hurrying on level ground or walking up a slight hill.  This is been going on for 2 years without any change.  In terms of his past medical history he has some arthralgia which is due to rheumatoid arthritis and is on Humira.  He has acid reflux disease.  Otherwise past medical history is negative  In terms of personal exposure history: He is a heavy smoker started smoking at age 13.  Smokes 20 cigarettes a day.  He is trying to quit.  He does not want the help of Chantix or Zyban.  He is trying to quit on his own by reducing the number of cigarettes he smokes each day.  He does not use any street drugs.  In terms of family history of lung disease: Positive for COPD.  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  He thinks his father-in-law got this from paint exposure.  There is no pulmonary fibrosis and the blood family  Home  exposure history: Negative for humidifier [place] or hot tub or Jacuzzi O.  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  So he gets both bleach exposure and mold exposure.  He gets exposed at least 4 homes each year for the last 35 years.  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  There were around 40 pigeons and a small 100 square feet room.  He would visit these patients once a week for 2 years.  He would then feed them and pick them up to race.  During this time he got exposed to significant amount of bird feathers and toilet.  Occupational history: As a Curator as above involving bleach, paint fumes and mold  Pulmonary toxicity history: Negative.  Denies any amiodarone or beta really mL cobalt or radiation or chemotherapy or BCG or nitrofurantoin exposure  Walking desaturation test on 03/27/2017 185 feet x 3 laps on ROOM AIR:  did not desaturate. Rest pulse ox was 100%, final pulse ox was 98%. HR response 63/min at rest to 86/min at peak exertion. Patient Robert Greene  Did not Desaturate < 88% .  Robert Greene did not  Desaturated </= 3% points. Robert Greene did not get tachyardic   IMPRESSION: 1. The appearance of the lungs is compatible with interstitial lung disease, with a CT pattern considered indeterminate for UIP (usual interstitial pneumonia). 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 2. Mild diffuse bronchial wall thickening with mild centrilobular and paraseptal emphysema; imaging findings suggestive of underlying COPD. 3. Aortic atherosclerosis, in addition to left anterior  descending coronary artery disease. Please note that although the presence of coronary artery calcium documents the presence of coronary artery disease, the severity of this disease and any potential stenosis cannot be assessed on this non-gated CT examination. Assessment for potential risk factor modification, dietary therapy or pharmacologic therapy may be warranted, if clinically indicated.  Aortic Atherosclerosis (ICD10-I70.0) and Emphysema (ICD10-J43.9).   Electronically Signed   By: Robert Greene M.D.   On: 03/22/2017 10:25      OV 09/26/2017  Subjective:  Patient ID: Robert Greene, male , DOB: 10/15/1960 , age MRN: [id number] , ADDRESS: [place] [id number]   09/26/2017 -   Chief Complaint  Patient presents with  . Follow-up    HRCT performed 8/15 and PFT performed today. Pt states he will have occ SOB but denies any real complaints.     HPI Robert Greene 59 y.o. -presents for shortness of breath and ILD evaluation. In the interim he has seen Dr. Harriet Greene. He had a cardiac stress test in March 2019 actually and this was normal with an ejection fraction of 60%. He has seen Dr. Amil Greene of rheumatology 05/08/2017 and I reviewed this note.Diagnosis is polymyalgia rheumatica and seronegative rheumatoid arthritis. He has a history of heavy alcohol use.Marland Kitchen Appears July 2016 rheumatoid factor, CCP, ANA and IgM were negative. In this visit he was supposed to quit smoking and reevaluate. However he has not been able to quit smoking. He continues to spray paint. Continues to be exposed to pigeons. His primary function test ironically is normal as is the walk test but his high resolution CT chest August 2019 that I personally visualized the show ILD. Based on 2018 ATS criteria and mypersonal visualization opinion I think the pattern is indeterminate although the most recent radiologist has called this is probable UIP.     IMPRESSION:  1. Spectrum of findings  suggestive of a mild fibrotic interstitial lung disease with slight basilar gradient and no frank honeycombing. 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 No appreciable progression since 02/14/2017 chest CT. 2. One vessel coronary atherosclerosis. 3. Mild diffuse hepatic steatosis.  Aortic Atherosclerosis (ICD10-I70.0) and Emphysema (ICD10-J43.9).   Electronically Signed   By: Ilona Sorrel M.D.   On: 09/06/2017 10:53   OV 11/22/2017  Subjective:  Patient ID: Robert Greene, male , DOB: 09-Oct-1960 , age 34 y.o. , MRN: [id number] , ADDRESS: [place] [address]   11/22/2017 -   Chief Complaint  Patient presents with  . Follow-up    States his breathing is the same at his baseline. No new concerns.      HPI Robert Greene 59 y.o. -interstitial lung disease not otherwise specified.  Here for follow-up.  This visit was supposed to be after surgical lung biopsy.  However he visited with Dr. Roxan Hockey and decided  to defer the date of the biopsy because he wanted to go on disability.  He now is on disability and feels he is ready to have surgical lung biopsy.  He says he will call Dr. Roxan Hockey and make an appointment.  In the interim he feels he is mildly more short of breath especially before inclines and steps than before.  Otherwise no other new problems.  Immunization record shows that he could benefit from Pneumovax.  He is up-to-date with the flu shot.  Walking desaturation test suggest possible worsening and tachycardia than before       OV 03/12/2018  Subjective:  Patient ID: Robert Greene, male , DOB: 11/20/1960 , age 59 y.o. , MRN: [id number] , ADDRESS: [place] [address]   03/12/2018 -   Chief Complaint  Patient presents with  . Follow-up    PFT performed today. Pt states breathing is about the same since last visit and states he has had some chest congestion.    Follow-up biopsy-proven smoking-related  interstitial lung fibrosis #14 2019  HPI Robert Greene 59 y.o. -returns for follow-up.  He says that he has quit smoking now for 4 months.  Overall he feels stable.  He had pulmonary function test today that shows a decline in FVC but stability and DLCO value.  He feels the same.  He does not believe that the [facility] decline is real.  His symptom scores are reported below and minimal.  He is gaining some weight because of quitting smoking.  He is up-to-date with his flu shot.      Results for MICHEIL, KLAUS (MRN [id number]) as of 03/12/2018 10:11  Ref. Range 09/26/2017 09:45 01/04/2018 11:05 03/12/2018 09:01  FVC-Pre Latest Units: L 4.32 3.95 3.69  FVC-%Pred-Pre Latest Units: % 91 83 77   Results for NAMARI, BRETON (MRN [id number]) as of 03/12/2018 10:11  Ref. Range 09/26/2017 09:45 01/04/2018 11:05 03/12/2018 09:01-GL i.e. equation  DLCO unc Latest Units: ml/min/mmHg 30.39 25.65 29.08  DLCO unc % pred Latest Units: % 97 81 105     OV 06/26/2019  Subjective:  Patient ID: Robert Greene, male , DOB: Aug 02, 1960 , age 66 y.o. , MRN: [id number] , ADDRESS: [place] [id number]   06/26/2019 -   Chief Complaint  Patient presents with  . Follow-up    sob with yard work.  cough from sinus.  has post nasal drip.   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 -presents for follow-up of the same.  He is on observation therapy given stability in lung function.  Last seen before the pandemic.  After that discussion ILD conference confirmed smoking-related interstitial fibrosis.  He says he was doing well.  Overall he says shortness of breath is the same.  I will do symptom questionnaire shows a two-point variation and dyspnea..  This could not be clinically significant.  His simple walking desaturation test is stable.  His pulmonary function test done today is stable.  However he is relapsed with the smoking.  This happened in May 2020.  He  says he did smoke 1 cigarette after a craving and then he relapsed.  His wife is also smoking but she is quitting.  She is down to 2 cigarettes/day.  He is refused help with Chantix or Zyban.  He says he will do it out of willpower and he will do it along with his wife in terms of quitting  smoking.  He did have a low-dose CT scan of the chest a #2020 for lung cancer screening.  In this the ILD is noted to be stable.  He is not had his Covid vaccine.  He says that is not in of long-term safety data.  He is just happy isolating.  He says he might consider Covid vaccine once his long-term safety data.  He is not satisfied with the short-term safety data and the current safety data that is available worldwide.    SYMPTOM SCALE - ILD 03/12/2018  06/26/2019   O2 use ra   Shortness of Breath 0 -> 5 scale with 5 being worst (score 6 If unable to do)   At rest 1 1  Simple tasks - showers, clothes change, eating, shaving 0* 1  Household (dishes, doing bed, laundry) 1* 2  Shopping 0 1  Walking level at own pace 1 2  Walking up Stairs 2 3  Total (40 - 48) Dyspnea Score 8 10  How bad is your cough? 1 1  How bad is your fatigue 1 1  nausea  1  vomit  0  diarrhea  0  anxiety  1  depression  0       Simple office walk 185 feet x  3 laps goal with forehead probe 09/26/2017  11/22/2017  03/12/2018  06/26/2019   O2 used Room air Room air Room air Room air  Number laps completed 3 3 3  x 185 feet 3 x 185  Comments about pace Normal to mod pace  normal nl  Resting Pulse Ox/HR 99% and 69/min 100% and 73/min 99% and 68/min 100% and 69  Final Pulse Ox/HR 99% and 89/min 100% and 97/min 97% and 88/min 100% and 91/min  Desaturated </= 88% no no no no  Desaturated <= 3% points no no no no  Got Tachycardic >/= 90/min No but almost yes no yes  Symptoms at end of test no x none Mild dyspnea  Miscellaneous comments x x x      Results for TARA, MALISZEWSKI (MRN [id number]) as of 06/26/2019 11:49  Ref. Range  09/26/2017 10:17 01/04/2018 11:05 03/12/2018 09:01 06/16/2019 08:51  FVC-Pre Latest Units: L 4.32 3.95 3.69 4.14  FVC-%Pred-Pre Latest Units: % 91 83 77 88   Results for Robert Greene, Robert Greene (MRN [id number]) as of 06/26/2019 11:49  Ref. Range 09/26/2017 10:17 01/04/2018 11:05 03/12/2018 09:01 06/16/2019 08:51  DLCO unc Latest Units: ml/min/mmHg 30.39 25.65 29.08 28.02  DLCO unc % pred Latest Units: % 97 81 105 103   ROS - per HPI     has a past medical history of Anxiety, Arthritis, Coronary artery calcification seen on CAT scan (04/08/2018), Dyspnea, HTN (hypertension) (04/08/2018), Hypercholesteremia, Hypercholesteremia, Hypertension, and Pulmonary fibrosis ([place]).   reports that he has been smoking cigarettes. He has smoked for the past 40.00 years. He has never used smokeless tobacco.  Past Surgical History:  Procedure Laterality Date  . JOINT REPLACEMENT     x2 scopes not replacement  . LUNG BIOPSY Right 12/06/2017   Procedure: LUNG BIOPSY;  Surgeon: Melrose Nakayama, MD;  Location: [facility];  Service: Thoracic;  Laterality: Right;  Marland Kitchen VIDEO ASSISTED THORACOSCOPY Right 12/06/2017   Procedure: VIDEO ASSISTED THORACOSCOPY;  Surgeon: Melrose Nakayama, MD;  Location: [facility];  Service: Thoracic;  Laterality: Right;    Allergies  Allergen Reactions  . Bee Venom Anaphylaxis, Hives and Rash  . Spider Antivenin [Black Widow Spider Antivenin (L.Mactans)] Anaphylaxis, Hives  and Rash    Immunization History  Administered Date(s) Administered  . Influenza,inj,Quad PF,6+ Mos 09/26/2017, 09/01/2018  . Pneumococcal Conjugate-13 11/22/2017  . Zoster 06/24/2019    No family history on file.   Current Outpatient Medications:  .  amLODipine (NORVASC) 5 MG tablet, TAKE 1 TABLET BY MOUTH EVERY DAY, Disp: 90 tablet, Rfl: 0 .  atorvastatin (LIPITOR) 80 MG tablet, Take 80 mg by mouth daily., Disp: , Rfl: 1 .  diphenhydrAMINE (BENADRYL) 25 MG tablet, Take 25 mg by mouth daily as needed for itching. ,  Disp: , Rfl:  .  EPINEPHrine 0.3 mg/0.3 mL IJ SOAJ injection, Use once.  If ineffective, use 2nd dose., Disp: 2 Device, Rfl: 0 .  etanercept (ENBREL) 50 MG/ML injection, Inject 50 mg into the skin once a week., Disp: , Rfl:  .  hydrochlorothiazide (HYDRODIURIL) 12.5 MG tablet, TAKE 1 TABLET BY MOUTH EVERY DAY, Disp: 90 tablet, Rfl: 1 .  hydroxychloroquine (PLAQUENIL) 200 MG tablet, Take 200 mg by mouth daily. , Disp: , Rfl: 2 .  losartan (COZAAR) 100 MG tablet, TAKE 1 TABLET BY MOUTH EVERY DAY, Disp: 90 tablet, Rfl: 1 .  Omega-3 Fatty Acids (FISH OIL PO), Take 2 capsules by mouth 2 (two) times daily. , Disp: , Rfl:       Objective:   Vitals:   06/26/19 1120  BP: 120/70  Pulse: 94  Temp: 97.9 F (36.6 C)  TempSrc: Oral  SpO2: 98%  Weight: 216 lb 9.6 oz (98.2 kg)  Height: 5\' 10"  (1.778 m)    Estimated body mass index is 31.08 kg/m as calculated from the following:   Height as of this encounter: 5\' 10"  (1.778 m).   Weight as of this encounter: 216 lb 9.6 oz (98.2 kg).  @WEIGHTCHANGE @  [employer]   06/26/19 1120  Weight: 216 lb 9.6 oz (98.2 kg)     Physical Exam Pleasant male.  Oral cavity normal faint basal crackles at the most.  Mild possible clubbing no sinus n no edema.  No stigmata of connective tissue disease.  Normal heart sounds.       Assessment:       ICD-10-CM   1. ILD (interstitial lung disease) ([place])  J84.9   2. Moderate cigarette smoker (10-19 per day)  F17.210 Pulmonary Function Test  3. Vaccine counseling  Z71.89        Plan:     Patient Instructions  ILD (interstitial lung disease) ([place])  -Currently stable and this is due to smoking call smoking-related interstitial fibrosis [S RIF] -Continued smoking is going to put you at risk for sudden progression but we are lucky that it is stable so far  Plan -November/December 2021 do high-resolution CT chest (cancer low-dose CT scan of the chest for cancer screening] -Do spirometry and DLCO  November/December 2021  Moderate cigarette smoker (10-19 per day)  -Too bad this has been relapsed for 1 year  Plan -Respect your decision not to take Chantix or Zyban -Respect your decision that he will work with your wife and willpower to quit smoking -I hope you have quit smoking in the next few months  Vaccine counseling  -Noted that you have not taken the Covid vaccine  Plan -After extensive counseling respect the fact that he will just follow social distancing approach to prevent Covid -Please note that there is presence of monoclonal antibody infusions as prophylaxis and treatment for COVID-19 in case you get infected or exposed  Follow-up -6 months do high-resolution  CT chest supine and prone -6 months do spirometry and DLCO -Return to see Dr. [name] for a 30-minute face-to-face visit in ILD clinic or any day in 6 months after the test       SIGNATURE    Dr. Brand Males, M.D., F.C.C.P,  Pulmonary and Critical Care Medicine Staff Physician, [place] Director - Interstitial Lung Disease  Program  Pulmonary [address] [facility] [place], [place], [address]  Pager: [phone], If no answer or between  15:00h - 7:00h: call [phone] Telephone: [phone]  12:46 PM 06/26/2019

## 2019-06-26 NOTE — Patient Instructions (Addendum)
ILD (interstitial lung disease) ([place])  -Currently stable and this is due to smoking call smoking-related interstitial fibrosis [S RIF] -Continued smoking is going to put you at risk for sudden progression but we are lucky that it is stable so far  Plan -November/December 2021 do high-resolution CT chest (cancer low-dose CT scan of the chest for cancer screening] -Do spirometry and DLCO November/December 2021  Moderate cigarette smoker (10-19 per day)  -Too bad this has been relapsed for 1 year  Plan -Respect your decision not to take Chantix or Zyban -Respect your decision that he will work with your wife and willpower to quit smoking -I hope you have quit smoking in the next few months  Vaccine counseling  -Noted that you have not taken the Covid vaccine  Plan -After extensive counseling respect the fact that he will just follow social distancing approach to prevent Covid -Please note that there is presence of monoclonal antibody infusions as prophylaxis and treatment for COVID-19 in case you get infected or exposed  Follow-up -6 months do high-resolution CT chest supine and prone -6 months do spirometry and DLCO -Return to see Dr. [name] for a 30-minute face-to-face visit in ILD clinic or any day in 6 months after the test

## 2019-06-27 ENCOUNTER — Ambulatory Visit: Payer: Self-pay | Admitting: Cardiology

## 2019-07-07 ENCOUNTER — Encounter: Payer: Self-pay | Admitting: Cardiology

## 2019-07-07 ENCOUNTER — Ambulatory Visit: Payer: 59 | Admitting: Cardiology

## 2019-07-07 ENCOUNTER — Other Ambulatory Visit: Payer: Self-pay

## 2019-07-07 VITALS — BP 140/82 | HR 64 | Ht 70.0 in | Wt 220.0 lb

## 2019-07-07 DIAGNOSIS — J849 Interstitial pulmonary disease, unspecified: Secondary | ICD-10-CM

## 2019-07-07 DIAGNOSIS — I10 Essential (primary) hypertension: Secondary | ICD-10-CM

## 2019-07-07 DIAGNOSIS — E782 Mixed hyperlipidemia: Secondary | ICD-10-CM

## 2019-07-07 DIAGNOSIS — I6523 Occlusion and stenosis of bilateral carotid arteries: Secondary | ICD-10-CM | POA: Insufficient documentation

## 2019-07-07 DIAGNOSIS — I2510 Atherosclerotic heart disease of native coronary artery without angina pectoris: Secondary | ICD-10-CM

## 2019-07-07 DIAGNOSIS — Z720 Tobacco use: Secondary | ICD-10-CM

## 2019-07-07 NOTE — Progress Notes (Signed)
Robert Greene Date of Birth: 02-08-1960 MRN: [id number] Primary Care Provider:Little, Lennette Bihari, MD Former Cardiology Providers: Jeri Lager, APRN, FNP-C Primary Cardiologist: Rex Kras, DO, [facility] (established care 07/07/2019)  Date: 07/07/19 Last Visit: 11/27/2018  Chief Complaint  Patient presents with   Coronary Artery Disease   Follow-up    HPI  Robert Greene is a 59 y.o.  male who presents to the office with a chief complaint of " management of hypertension hyperlipidemia." Patient's past medical history and cardiovascular risk factors include:  coronary calcification by CT scan, rheumatoid arthritis, hypertension, hyperlipidemia, history of heavy tobacco use, recently diagnosed in January 2019 with interstitial lung disease and pulmonary fibrosis followed by Dr. [name].  His cardiac work-up so far has included an echocardiogram and stress test. Underwent exercise stress testing on 04/16/2017 that was considered low risk study. Echocardiogram on 05/10/2017 showed mild LVH, otherwise normal. No evidence of pulmonary hypertension.  UnderwentSurgical lung biopsyon11/14/2019 revealing chronic interstitial lung disease with emphysema, respiratory bronchiolitis and smoking-related interstitial fibrosis.  He is here today for 41-month follow-up for management of hypertension and hyperlipidemia. Patient does not check his blood pressures at home.  However compliant with medications and dietary changes.  His blood pressure is on the normal and he is recommended to check it periodically to make sure that his systolic blood pressures are between 120-130 mmHg.  Patient is currently on atorvastatin for lipid management.  His last lipid profile was checked in February 2020 results reviewed and noted below.  Unfortunately, patient continues to smoke three fourth of a pack per day.  He had period of time for 6 months where he had stopped completely.  He is encouraged on importance is  complete smoking cessation.  ALLERGIES: Allergies  Allergen Reactions   Bee Venom Anaphylaxis, Hives and Rash   Spider Antivenin [Black Widow Spider Antivenin (L.Mactans)] Anaphylaxis, Hives and Rash     MEDICATION LIST PRIOR TO VISIT: Current Outpatient Medications on File Prior to Visit  Medication Sig Dispense Refill   amLODipine (NORVASC) 5 MG tablet TAKE 1 TABLET BY MOUTH EVERY DAY 90 tablet 0   atorvastatin (LIPITOR) 80 MG tablet Take 80 mg by mouth daily.  1   diphenhydrAMINE (BENADRYL) 25 MG tablet Take 25 mg by mouth daily as needed for itching.      EPINEPHrine 0.3 mg/0.3 mL IJ SOAJ injection Use once.  If ineffective, use 2nd dose. 2 Device 0   etanercept (ENBREL) 50 MG/ML injection Inject 50 mg into the skin once a week.     hydrochlorothiazide (HYDRODIURIL) 12.5 MG tablet TAKE 1 TABLET BY MOUTH EVERY DAY 90 tablet 1   hydroxychloroquine (PLAQUENIL) 200 MG tablet Take 200 mg by mouth daily.   2   losartan (COZAAR) 100 MG tablet TAKE 1 TABLET BY MOUTH EVERY DAY 90 tablet 1   Omega-3 Fatty Acids (FISH OIL PO) Take 2 capsules by mouth 2 (two) times daily.      No current facility-administered medications on file prior to visit.    PAST MEDICAL HISTORY: Past Medical History:  Diagnosis Date   Anxiety    Arthritis    rheumatoid   Coronary artery calcification seen on CAT scan 04/08/2018   Dyspnea    HTN (hypertension) 04/08/2018   Hypercholesteremia    Hypercholesteremia    Hypertension    Pulmonary fibrosis ([place])     PAST SURGICAL HISTORY: Past Surgical History:  Procedure Laterality Date   JOINT REPLACEMENT     x2 scopes  not replacement   LUNG BIOPSY Right 12/06/2017   Procedure: LUNG BIOPSY;  Surgeon: Melrose Nakayama, MD;  Location: [facility];  Service: Thoracic;  Laterality: Right;   VIDEO ASSISTED THORACOSCOPY Right 12/06/2017   Procedure: VIDEO ASSISTED THORACOSCOPY;  Surgeon: Melrose Nakayama, MD;  Location: [place];  Service:  Thoracic;  Laterality: Right;    FAMILY HISTORY: The patient's family history is not on file.   SOCIAL HISTORY:  The patient  reports that he has been smoking cigarettes. He has smoked for the past 40.00 years. He has never used smokeless tobacco. He reports current alcohol use of about 1.0 standard drink of alcohol per week. He reports that he does not use drugs.  Review of Systems  Constitutional: Negative for decreased appetite, malaise/fatigue, weight gain and weight loss.  HENT: Positive for hoarse voice (chronic).   Eyes: Negative for visual disturbance.  Cardiovascular: Positive for dyspnea on exertion (chronic and stable ). Negative for chest pain, claudication, leg swelling, orthopnea, palpitations and syncope.  Respiratory: Negative for hemoptysis and wheezing.   Endocrine: Negative for cold intolerance and heat intolerance.  Hematologic/Lymphatic: Does not bruise/bleed easily.  Skin: Negative for nail changes.  Musculoskeletal: Positive for joint pain. Negative for muscle weakness and myalgias.  Gastrointestinal: Negative for abdominal pain, change in bowel habit, nausea and vomiting.  Neurological: Negative for difficulty with concentration, dizziness, focal weakness and headaches.  Psychiatric/Behavioral: Negative for altered mental status and suicidal ideas.  All other systems reviewed and are negative.   PHYSICAL EXAM: Vitals with BMI 07/07/2019 07/07/2019 06/26/2019  Height - 5\' 10"  5\' 10"   Weight - 220 lbs 216 lbs 10 oz  BMI - 58.85 02.77  Systolic 412 878 676  Diastolic 82 96 70  Pulse 64 65 94    CONSTITUTIONAL: Well-developed and well-nourished. No acute distress.  SKIN: Skin is warm and dry. No rash noted. No cyanosis. No pallor. No jaundice HEAD: Normocephalic and atraumatic.  EYES: No scleral icterus MOUTH/THROAT: Moist oral membranes.  NECK: No JVD present. No thyromegaly noted. No carotid bruits  LYMPHATIC: No visible cervical adenopathy.  CHEST Normal  respiratory effort. No intercostal retractions  LUNGS: Decreased breath sound at the bases.  No stridor. No wheezes. No rales.  CARDIOVASCULAR: Regular, positive S1-S2, no murmurs rubs or gallops appreciated. ABDOMINAL: No apparent ascites.  EXTREMITIES: No peripheral edema  HEMATOLOGIC: No significant bruising NEUROLOGIC: Oriented to person, place, and time. Nonfocal. Normal muscle tone.  PSYCHIATRIC: Normal mood and affect. Normal behavior. Cooperative  RADIOLOGY: CT scan of chest8/15/2019: 1. Spectrum of findings suggestive of a mild fibrotic interstitial lung disease with slight basilar gradient and no frank honeycombing. 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  No appreciable progression since 02/14/2017 chest CT. 2. One vessel coronary atherosclerosis. 3. Mild diffuse hepatic steatosis.  CARDIAC DATABASE: EKG: 07/07/2019: Normal sinus, 61 bpm, normal axis, poor R wave progression, without underlying injury pattern.    Echocardiogram: 05/05/2017: LVEF 60%, mild LVH, normal diastolic filling pressures, no significant valvular abnormality,  Stress Testing:  Exercise sestamibi stress test 04/16/2017: 1. The patient performed treadmill exercise using a Bruce protocol, completing 6:44 minutes, achieved 8.09 METS, 89%MPHR.  2.  Hypertensive response to exercise. 3. Stress electrocardiogram showed no ischemic changes.  4. Stress and rest SPECT images demonstrate homogeneous tracer distribution throughout the myocardium. Gated SPECT imaging reveals normal myocardial thickening and wall motion. LVEF by Gated SPECT 60%. Low risk study.  US Carotid Bilateral 02/14/2017:Moderate bilateral carotid atherosclerosis. No hemodynamically  significant ICA stenosis. Degree of narrowing less than 50% bilaterally by ultrasound criteria.  LABORATORY DATA: CBC Latest Ref Rng & Units 12/08/2017 12/07/2017 12/05/2017  WBC 4.0 - 10.5 K/uL 13.1(H) 16.4(H) 7.4  Hemoglobin 13.0 -  17.0 g/dL 13.4 14.5 15.2  Hematocrit 39 - 52 % 42.3 43.3 48.6  Platelets 150 - 400 K/uL 155 164 188    CMP Latest Ref Rng & Units 04/09/2018 12/08/2017 12/07/2017  Glucose 65 - 99 mg/dL 149(H) 133(H) 205(H)  BUN 6 - 24 mg/dL 9 14 12   Creatinine 0.76 - 1.27 mg/dL 0.75(L) 0.83 1.00  Sodium 134 - 144 mmol/L 142 135 134(L)  Potassium 3.5 - 5.2 mmol/L 4.2 3.6 3.7  Chloride 96 - 106 mmol/L 99 102 102  CO2 20 - 29 mmol/L 25 27 22   Calcium 8.7 - 10.2 mg/dL 9.5 8.6(L) 8.9  Total Protein 6.0 - 8.5 g/dL 7.1 6.1(L) -  Total Bilirubin 0.0 - 1.2 mg/dL 0.3 0.9 -  Alkaline Phos 39 - 117 IU/L 99 54 -  AST 0 - 40 IU/L 82(H) 30 -  ALT 0 - 44 IU/L 88(H) 31 -    Lipid Panel  No results found for: CHOL, TRIG, HDL, CHOLHDL, VLDL, LDLCALC, LDLDIRECT, LABVLDL  No results found for: HGBA1C No components found for: NTPROBNP No results found for: TSH  Cardiac Panel (last 3 results) No results for input(s): CKTOTAL, CKMB, TROPONINIHS, RELINDX in the last 72 hours.  IMPRESSION:    ICD-10-CM   1. Coronary artery calcification seen on CAT scan  I25.10 EKG 12-Lead  2. Essential hypertension  I10   3. Mixed hyperlipidemia  E78.2   4. ILD (interstitial lung disease) ([place])  J84.9   5. Current tobacco use  Z72.0   6. Atherosclerosis of both carotid arteries  I65.23 PCV CAROTID DUPLEX (BILATERAL)     RECOMMENDATIONS: Robert Greene is a 59 y.o. male whose past medical history and cardiovascular risk factors include:  coronary calcification by CT scan, rheumatoid arthritis, hypertension, hyperlipidemia, history of heavy tobacco use, recently diagnosed in January 2019 with interstitial lung disease and pulmonary fibrosis followed by Dr. [name].  Benign essential hypertension:  Office blood pressures are upper limit of normal.  Patient is asked to keep a log of his blood pressures at home approximately 3 times a week.  If his systolic blood pressures are consistently greater than 140 mmHg he is asked to  call the office for further medication titration prior to the next visit.  Low-salt diet recommended.  Medications reconciled.  Hyperlipidemia:  Last lipid profile performed in February 2020.  Recommended repeat lipid profile but he would like to get it done with his PCP.  I have asked him to bring the labs at the next office visit.  Currently on atorvastatin 80 mg p.o. nightly  He used to be on Zetia in the past.  Bilateral carotid artery atherosclerosis:  We will check carotid duplex.  Patient will have lipids done with his PCP  Continue statin therapy.  Active tobacco use:  Patient has stopped smoking for approximately 6 months.  However, he has started smoking again and at the last office visit had plans to stop.  However he continues to smoke three fourths of a pack on a daily basis.  Educated on the importance of complete smoking cessation.  Had a detailed discussion with the patient in regards to being on aspirin 81 mg p.o. daily given his underlying history of carotid artery atherosclerosis and coronary artery calcification.  However, patient states that in the past he had nosebleeds and therefore has stopped taking aspirin.  I have asked him to discuss this further with his PCP and even possibly see ENT if needed.  FINAL MEDICATION LIST END OF ENCOUNTER: No orders of the defined types were placed in this encounter.   There are no discontinued medications.   Current Outpatient Medications:    amLODipine (NORVASC) 5 MG tablet, TAKE 1 TABLET BY MOUTH EVERY DAY, Disp: 90 tablet, Rfl: 0   atorvastatin (LIPITOR) 80 MG tablet, Take 80 mg by mouth daily., Disp: , Rfl: 1   diphenhydrAMINE (BENADRYL) 25 MG tablet, Take 25 mg by mouth daily as needed for itching. , Disp: , Rfl:    EPINEPHrine 0.3 mg/0.3 mL IJ SOAJ injection, Use once.  If ineffective, use 2nd dose., Disp: 2 Device, Rfl: 0   etanercept (ENBREL) 50 MG/ML injection, Inject 50 mg into the skin once a week.,  Disp: , Rfl:    hydrochlorothiazide (HYDRODIURIL) 12.5 MG tablet, TAKE 1 TABLET BY MOUTH EVERY DAY, Disp: 90 tablet, Rfl: 1   hydroxychloroquine (PLAQUENIL) 200 MG tablet, Take 200 mg by mouth daily. , Disp: , Rfl: 2   losartan (COZAAR) 100 MG tablet, TAKE 1 TABLET BY MOUTH EVERY DAY, Disp: 90 tablet, Rfl: 1   Omega-3 Fatty Acids (FISH OIL PO), Take 2 capsules by mouth 2 (two) times daily. , Disp: , Rfl:   Orders Placed This Encounter  Procedures   EKG 12-Lead   PCV CAROTID DUPLEX (BILATERAL)   --Continue cardiac medications as reconciled in final medication list. --Return in about 6 weeks (around 08/18/2019) for review test results.. Or sooner if needed. --Continue follow-up with your primary care physician regarding the management of your other chronic comorbid conditions.  Patient's questions and concerns were addressed to his satisfaction. He voices understanding of the instructions provided during this encounter.   This note was created using a voice recognition software as a result there may be grammatical errors inadvertently enclosed that do not reflect the nature of this encounter. Every attempt is made to correct such errors.  Rex Kras, [place], [facility]  Pager: [phone] Office: [phone]

## 2019-07-16 ENCOUNTER — Other Ambulatory Visit: Payer: Self-pay

## 2019-07-16 ENCOUNTER — Ambulatory Visit: Payer: 59

## 2019-07-16 DIAGNOSIS — I6523 Occlusion and stenosis of bilateral carotid arteries: Secondary | ICD-10-CM

## 2019-07-24 ENCOUNTER — Telehealth: Payer: Self-pay

## 2019-07-24 NOTE — Telephone Encounter (Signed)
-----   Message from [place], [place] sent at 07/21/2019 12:06 AM EDT ----- Carotid duplex noted plaque in bilateral common and internal carotid arteries no significant stenosis.  We will discuss additional details at the next office visit.

## 2019-07-24 NOTE — Telephone Encounter (Signed)
LMTCB

## 2019-07-25 NOTE — Telephone Encounter (Signed)
LM with results and appointment time . Instructed to call back with any questions

## 2019-07-29 ENCOUNTER — Other Ambulatory Visit: Payer: Self-pay | Admitting: Cardiology

## 2019-08-18 ENCOUNTER — Ambulatory Visit: Payer: 59 | Admitting: Cardiology

## 2019-08-24 ENCOUNTER — Other Ambulatory Visit: Payer: Self-pay | Admitting: Cardiology

## 2019-09-16 ENCOUNTER — Other Ambulatory Visit: Payer: Self-pay | Admitting: Cardiology

## 2019-09-16 ENCOUNTER — Other Ambulatory Visit: Payer: Self-pay

## 2019-09-16 ENCOUNTER — Ambulatory Visit
Admission: EM | Admit: 2019-09-16 | Discharge: 2019-09-16 | Disposition: A | Discharge status: Home / Self Care | Payer: 59 | Attending: Emergency Medicine | Admitting: Emergency Medicine

## 2019-09-16 DIAGNOSIS — N23 Unspecified renal colic: Secondary | ICD-10-CM | POA: Diagnosis not present

## 2019-09-16 LAB — POCT URINALYSIS DIP (MANUAL ENTRY)
Bilirubin, UA: NEGATIVE
Blood, UA: NEGATIVE
Glucose, UA: NEGATIVE mg/dL
Ketones, POC UA: NEGATIVE mg/dL
Leukocytes, UA: NEGATIVE
Nitrite, UA: NEGATIVE
Protein Ur, POC: NEGATIVE mg/dL
Spec Grav, UA: 1.02 (ref 1.010–1.025)
Urobilinogen, UA: 0.2 E.U./dL
pH, UA: 7.5 (ref 5.0–8.0)

## 2019-09-16 MED ORDER — ONDANSETRON 4 MG PO TBDP: 4.0000 mg | ORAL_TABLET | Freq: Three times a day (TID) | ORAL | 0 refills | Status: DC | PRN

## 2019-09-16 MED ORDER — KETOROLAC TROMETHAMINE 10 MG PO TABS: 10.0000 mg | ORAL_TABLET | Freq: Four times a day (QID) | ORAL | 0 refills | Status: DC | PRN

## 2019-09-16 MED ORDER — KETOROLAC TROMETHAMINE 30 MG/ML IJ SOLN
30.0000 mg | Freq: Once | INTRAMUSCULAR | Status: AC
  Administered 2019-09-16: 30 mg via INTRAMUSCULAR

## 2019-09-16 NOTE — Discharge Instructions (Addendum)
Toradol shot given. Take toradol every 6 hours for pain, zofran every 8 hours for nausea. Go to ER for worsening pain, vomiting, blood in urine, you are unable to urinate, or you develop a fever.

## 2019-09-16 NOTE — ED Triage Notes (Signed)
Pt c/o L flank pain since last night, pain radiating into LLQ starting this AM

## 2019-09-16 NOTE — ED Provider Notes (Signed)
[facility]    CSN: [id number] Arrival date & time: 09/16/19  2025      History   Chief Complaint Chief Complaint  Patient presents with  . Flank Pain    HPI Robert Greene is a 59 y.o. male  Presenting for left flank pain that radiates to left lower quadrant of abdomen since very early this morning.  Approximately 3 AM.  States has become worse, constant since.  Denies urinary symptoms such as hematuria, dysuria, decreased urine output.  No nausea or vomiting, fever.  Denies history of renal calculi.  No change in activity level or injury.  Past Medical History:  Diagnosis Date  . Anxiety   . Arthritis    rheumatoid  . Coronary artery calcification seen on CAT scan 04/08/2018  . Dyspnea   . HTN (hypertension) 04/08/2018  . Hypercholesteremia   . Hypercholesteremia   . Hypertension   . Pulmonary fibrosis [facility])     Patient Active Problem List   Diagnosis Date Noted  . Atherosclerosis of both carotid arteries 07/07/2019  . Essential hypertension 04/08/2018  . Coronary artery calcification seen on CAT scan 04/08/2018  . Laboratory examination 04/08/2018  . Mixed hyperlipidemia 04/08/2018  . ILD (interstitial lung disease) ([place]) 12/06/2017    Past Surgical History:  Procedure Laterality Date  . JOINT REPLACEMENT     x2 scopes not replacement  . LUNG BIOPSY Right 12/06/2017   Procedure: LUNG BIOPSY;  Surgeon: Melrose Nakayama, MD;  Location: [place];  Service: Thoracic;  Laterality: Right;  Marland Kitchen VIDEO ASSISTED THORACOSCOPY Right 12/06/2017   Procedure: VIDEO ASSISTED THORACOSCOPY;  Surgeon: Melrose Nakayama, MD;  Location: [facility] OR;  Service: Thoracic;  Laterality: Right;       Home Medications    Prior to Admission medications   Medication Sig Start Date End Date Taking? Authorizing Provider  losartan (COZAAR) 100 MG tablet TAKE 1 TABLET BY MOUTH EVERY DAY 08/25/19   Tolia, Sunit, DO  amLODipine (NORVASC) 5 MG tablet TAKE 1 TABLET BY MOUTH  EVERY DAY 07/30/19   Adrian Prows, MD  atorvastatin (LIPITOR) 80 MG tablet Take 80 mg by mouth daily. 10/18/16   [provider]  diphenhydrAMINE (BENADRYL) 25 MG tablet Take 25 mg by mouth daily as needed for itching.     [provider]  EPINEPHrine 0.3 mg/0.3 mL IJ SOAJ injection Use once.  If ineffective, use 2nd dose. 11/19/16   Isla Pence, MD  etanercept (ENBREL) 50 MG/ML injection Inject 50 mg into the skin once a week.    [provider]  hydrochlorothiazide (HYDRODIURIL) 12.5 MG tablet TAKE 1 TABLET BY MOUTH EVERY DAY 02/05/19   Miquel Dunn, NP  hydroxychloroquine (PLAQUENIL) 200 MG tablet Take 200 mg by mouth daily.  02/14/17   [provider]  ketorolac (TORADOL) 10 MG tablet Take 1 tablet (10 mg total) by mouth every 6 (six) hours as needed. 09/16/19   Hall-Potvin, [place], PA-C  Omega-3 Fatty Acids (FISH OIL PO) Take 2 capsules by mouth 2 (two) times daily.     [provider]  ondansetron (ZOFRAN ODT) 4 MG disintegrating tablet Take 1 tablet (4 mg total) by mouth every 8 (eight) hours as needed for nausea or vomiting. 09/16/19   Hall-Potvin, [place], PA-C    Family History History reviewed. No pertinent family history.  Social History Social History   Tobacco Use  . Smoking status: Current Every Day Smoker    Years: 40.00    Types:  Cigarettes  . Smokeless tobacco: Never Used  . Tobacco comment: quit 12/05/2017, but is now smoking 10-15 cigarettes daily   Vaping Use  . Vaping Use: Former  Substance Use Topics  . Alcohol use: Yes    Alcohol/week: 1.0 standard drink    Types: 1 Standard drinks or equivalent per week    Comment: daily  . Drug use: No     Allergies   Bee venom and Spider antivenin [black widow spider antivenin (l.mactans)]   Review of Systems As per HPI   Physical Exam Triage Vital Signs ED Triage Vitals [09/16/19 1025]  Enc Vitals Group     BP 116/79     Pulse Rate 81     Resp 20     Temp  97.8 F (36.6 C)     Temp Source Oral     SpO2 97 %     Weight      Height      Head Circumference      Peak Flow      Pain Score      Pain Loc      Pain Edu?      Excl. in [place]?    No data found.  Updated Vital Signs BP 116/79 (BP Location: Left Arm)   Pulse 81   Temp 97.8 F (36.6 C) (Oral)   Resp 20   SpO2 97%   Visual Acuity Right Eye Distance:   Left Eye Distance:   Bilateral Distance:    Right Eye Near:   Left Eye Near:    Bilateral Near:     Physical Exam Constitutional:      General: He is not in acute distress. HENT:     Head: Normocephalic and atraumatic.  Eyes:     General: No scleral icterus.    Pupils: Pupils are equal, round, and reactive to light.  Cardiovascular:     Rate and Rhythm: Normal rate.  Pulmonary:     Effort: Pulmonary effort is normal. No respiratory distress.     Breath sounds: No wheezing.  Abdominal:     Tenderness: There is left CVA tenderness. There is no right CVA tenderness.     Comments: LLQ TTP  Skin:    Coloration: Skin is not jaundiced or pale.  Neurological:     Mental Status: He is alert and oriented to person, place, and time.      UC Treatments / Results  Labs (all labs ordered are listed, but only abnormal results are displayed) Labs Reviewed  POCT URINALYSIS DIP (MANUAL ENTRY)    EKG   Radiology No results found.  Procedures Procedures (including critical care time)  Medications Ordered in UC Medications  ketorolac (TORADOL) 30 MG/ML injection 30 mg (30 mg Intramuscular Given 09/16/19 1126)    Initial Impression / Assessment and Plan / UC Course  I have reviewed the triage vital signs and the nursing notes.  Pertinent labs & imaging results that were available during my care of the patient were reviewed by me and considered in my medical decision making (see chart for details).     Patient given Toradol in office.  Urine unremarkable.  H&P concerning for renal colic.  Toradol improved  symptoms.  Will provide supportive care, strainer as outlined below.  Return precautions discussed, pt verbalized understanding and is agreeable to plan. Final Clinical Impressions(s) / UC Diagnoses   Final diagnoses:  Renal colic on left side     Discharge Instructions  Toradol shot given. Take toradol every 6 hours for pain, zofran every 8 hours for nausea. Go to ER for worsening pain, vomiting, blood in urine, you are unable to urinate, or you develop a fever.    ED Prescriptions    Medication Sig Dispense Auth. Provider   ketorolac (TORADOL) 10 MG tablet Take 1 tablet (10 mg total) by mouth every 6 (six) hours as needed. 20 tablet Hall-Potvin, [place], PA-C   ondansetron (ZOFRAN ODT) 4 MG disintegrating tablet Take 1 tablet (4 mg total) by mouth every 8 (eight) hours as needed for nausea or vomiting. 21 tablet Hall-Potvin, [place], PA-C     PDMP not reviewed this encounter.   Hall-Potvin, [place], [place] 09/16/19 1143

## 2019-09-19 IMAGING — CR DG CHEST 2V
2 series · 2 of 2 positions shown · non-contrast
Comparison: Chest CT scan September 06, 2017 and chest x-ray November 20, 2011.

CLINICAL DATA: Preoperative examination prior right lung surgery.
Long-term smoker recently discontinued. History of pulmonary
fibrosis.

EXAM:
CHEST - 2 VIEW

[w chest pa]
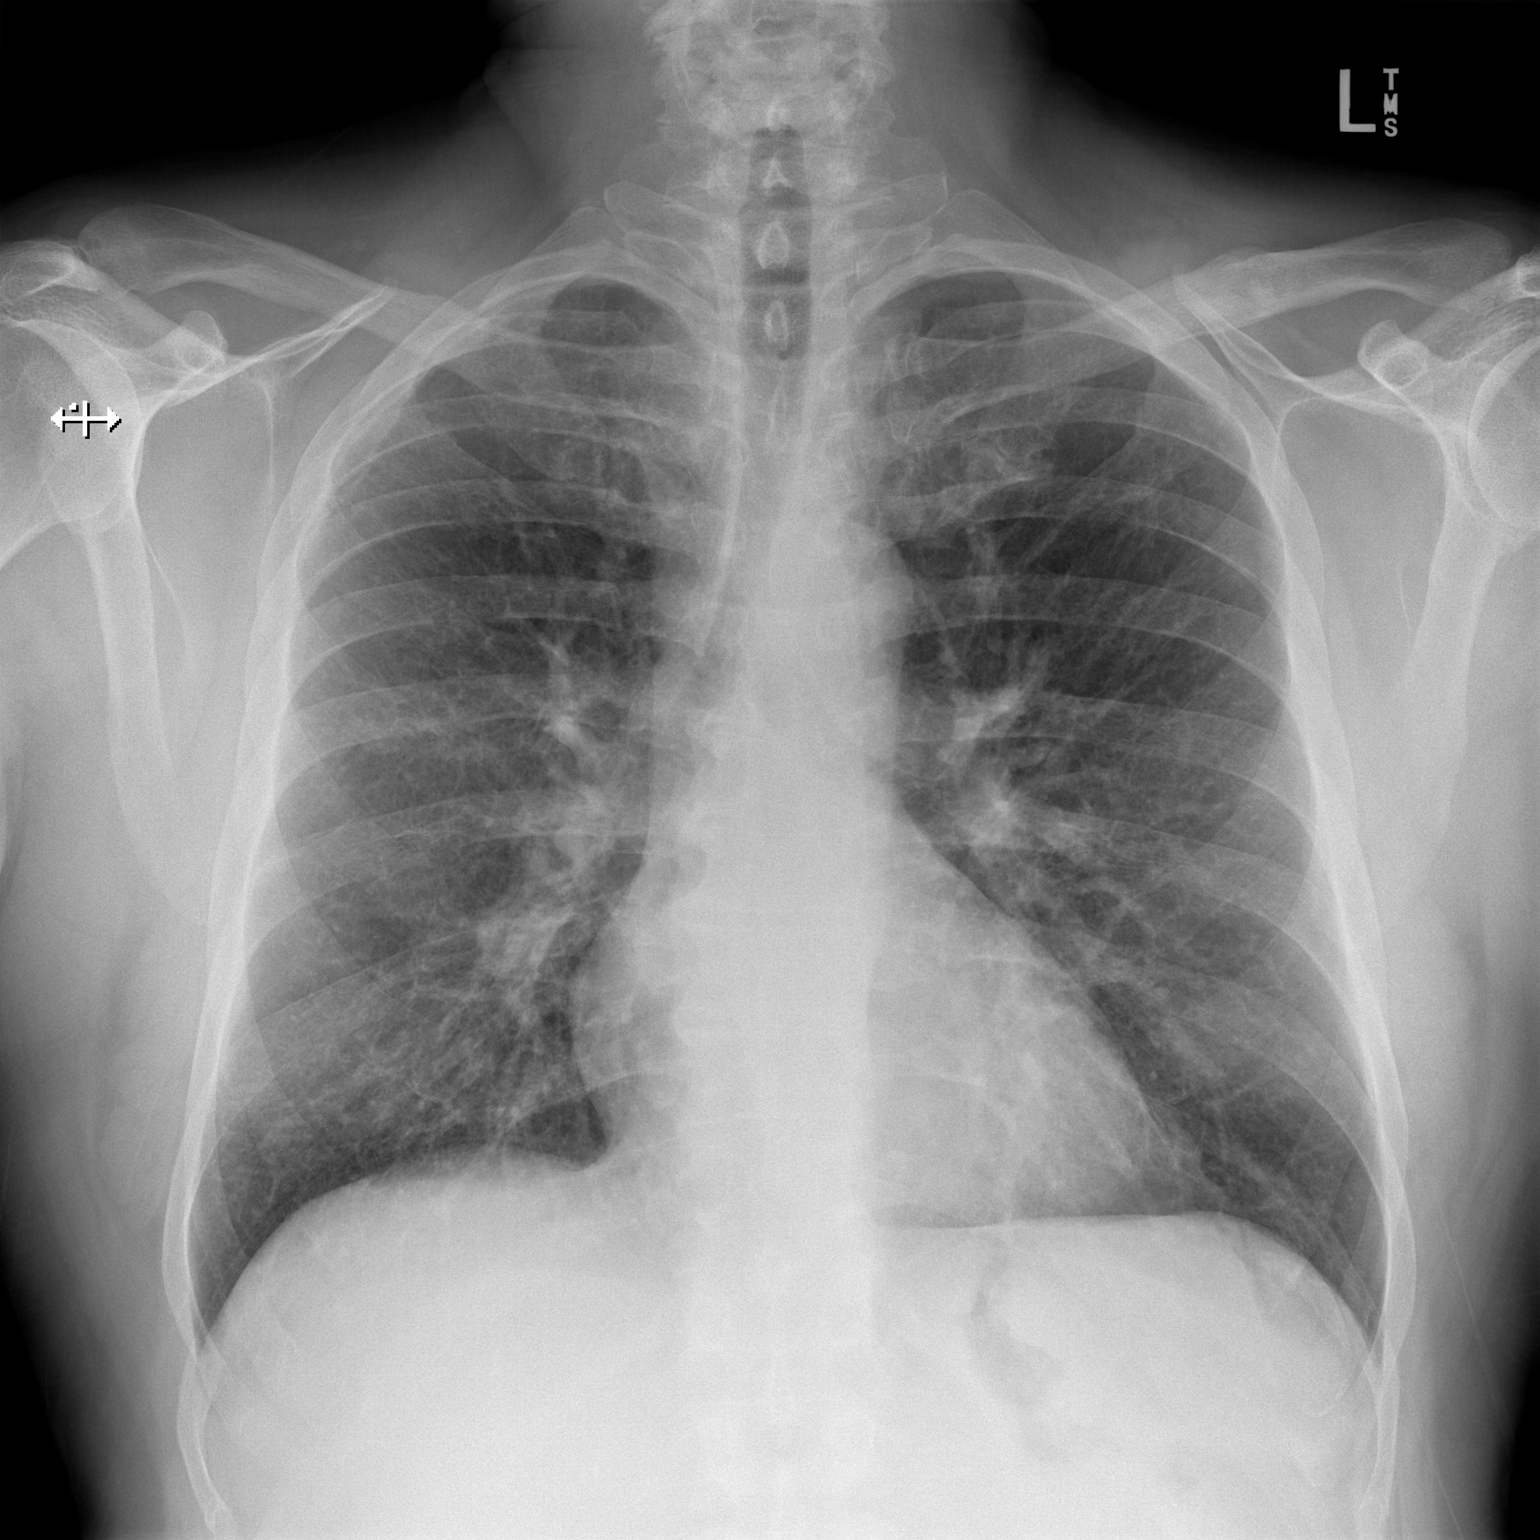

[w chest lat]
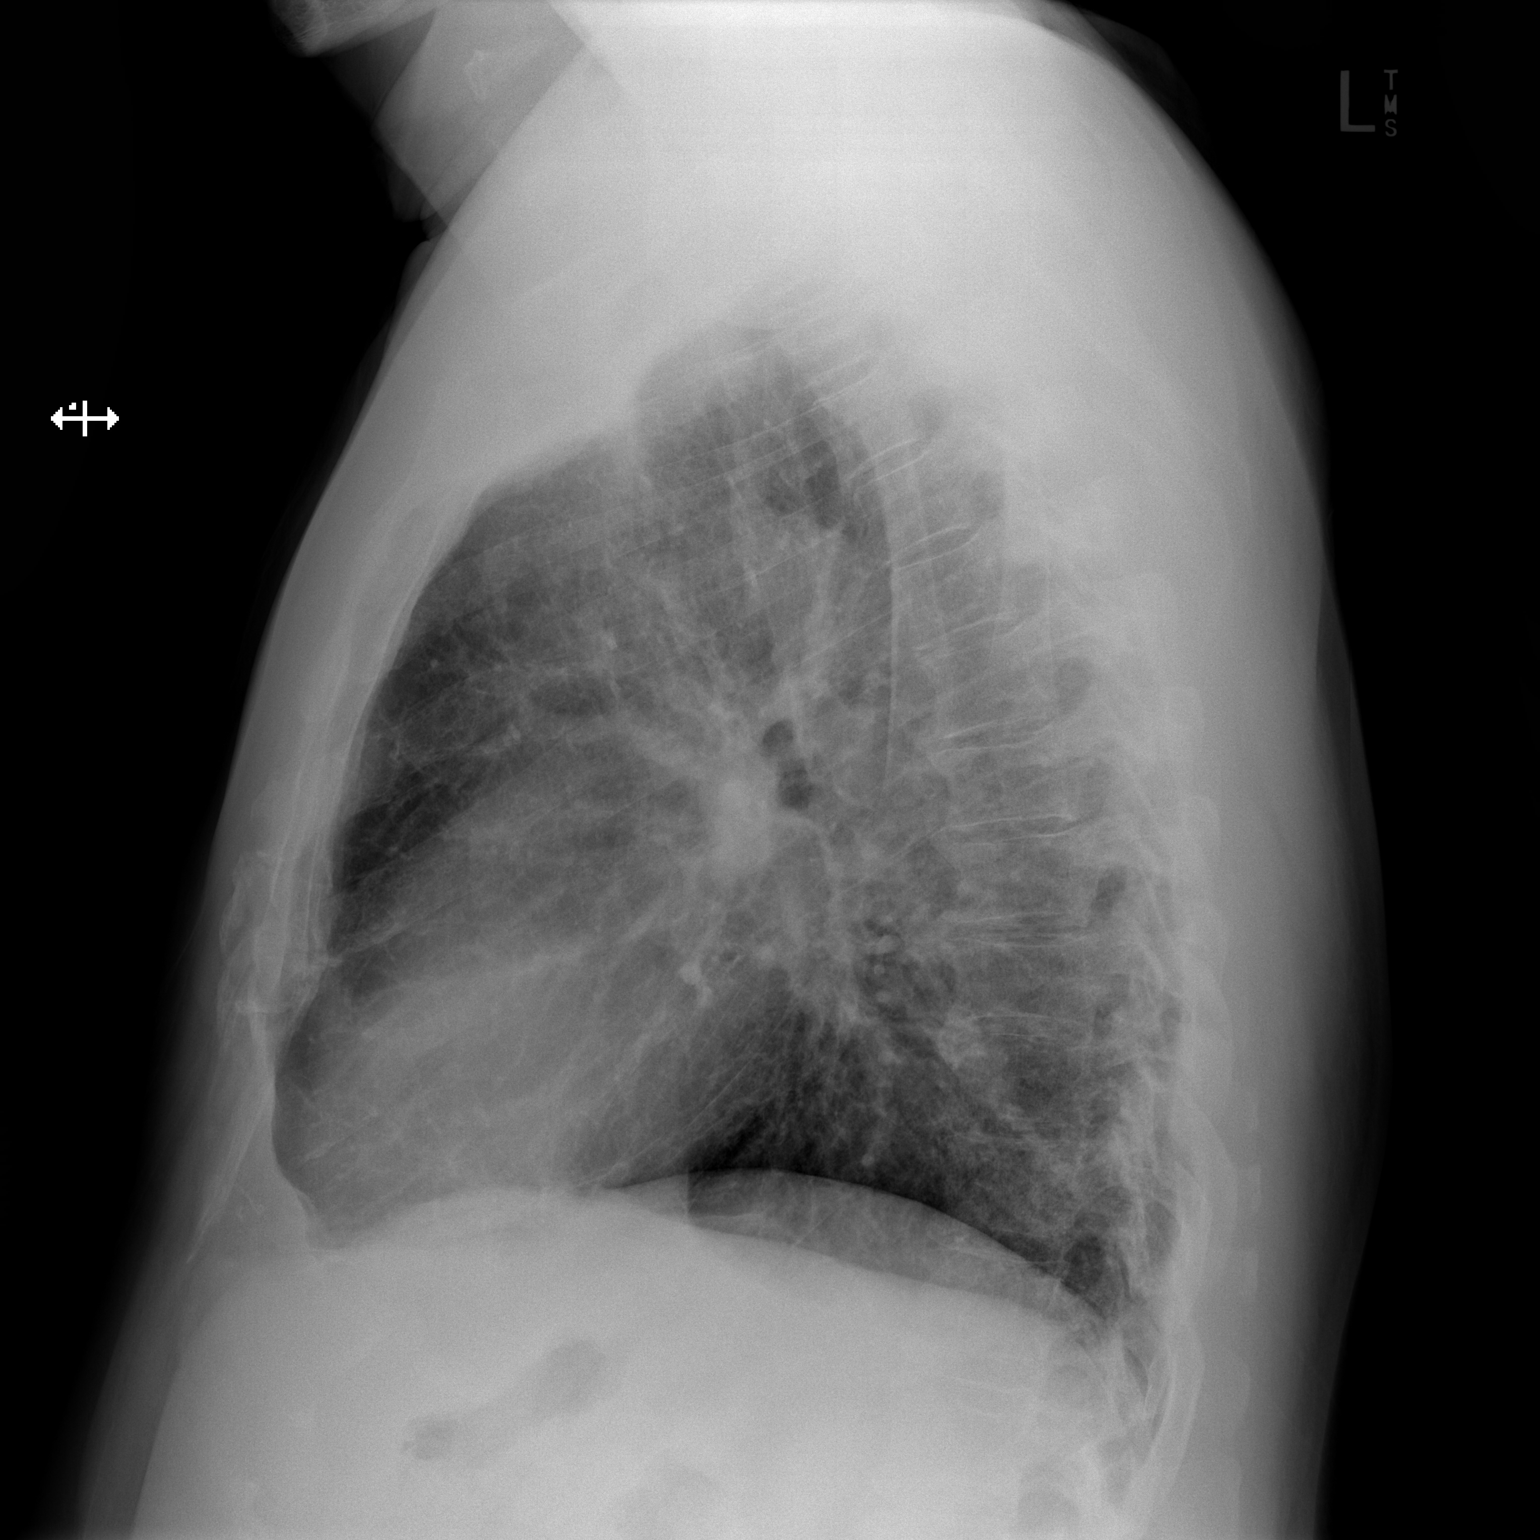

[2 of 2 positions shown; findings below may reference images not displayed]

FINDINGS: The lungs are well-expanded. The interstitial markings are increased
bilaterally. There is no alveolar infiltrate or pleural effusion.
The heart and pulmonary vascularity are normal. The mediastinum is
normal in width. The bony thorax exhibits no acute abnormality.
IMPRESSION: Increased interstitial markings consistent within the patient's
known diagnosis of pulmonary fibrosis. No alveolar pneumonia, CHF,
nor other acute cardiopulmonary abnormality.

## 2019-09-20 IMAGING — DX DG CHEST 1V PORT
1 series · 1 of 1 positions shown · non-contrast
Comparison: December 05, 2017

CLINICAL DATA: Status post right lung biopsy.

EXAM:
PORTABLE CHEST 1 VIEW

[chest ap]
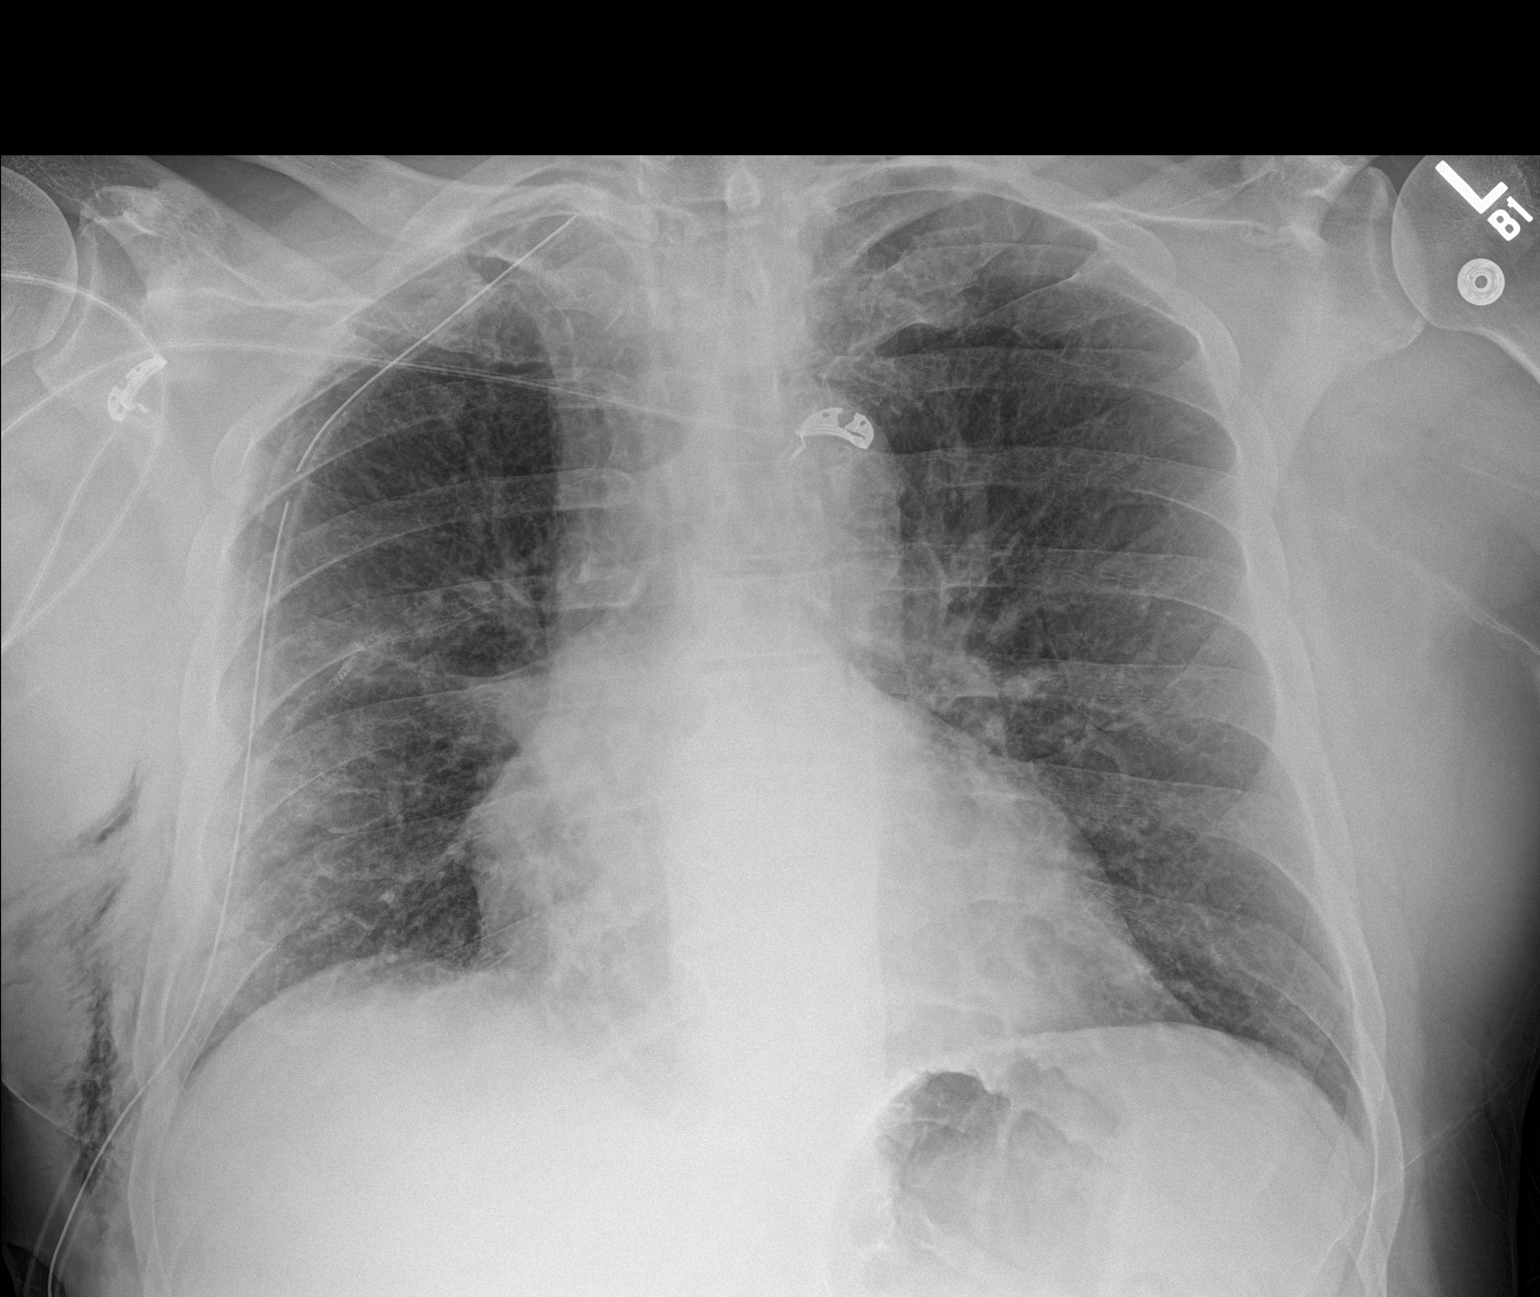

[1 of 1 positions shown; findings below may reference images not displayed]

FINDINGS: Postsurgical changes are seen in the right mid lung with a suture
line. A right chest tube is identified. No definitive pneumothorax.
The cardiomediastinal silhouette is stable. No other acute
abnormalities. Air is seen in the soft tissues of the right chest
wall.
IMPRESSION: Postoperative changes on the right with a suture line in the right
mid lung and air in the subcutaneous tissues of the right chest
wall. There is a right chest tube but no identified pneumothorax.

## 2019-09-21 IMAGING — DX DG CHEST 1V PORT
1 series · 1 of 1 positions shown · non-contrast
Comparison: 12/06/2017.

CLINICAL DATA: Interstitial lung disease.

EXAM:
PORTABLE CHEST 1 VIEW

[chest]
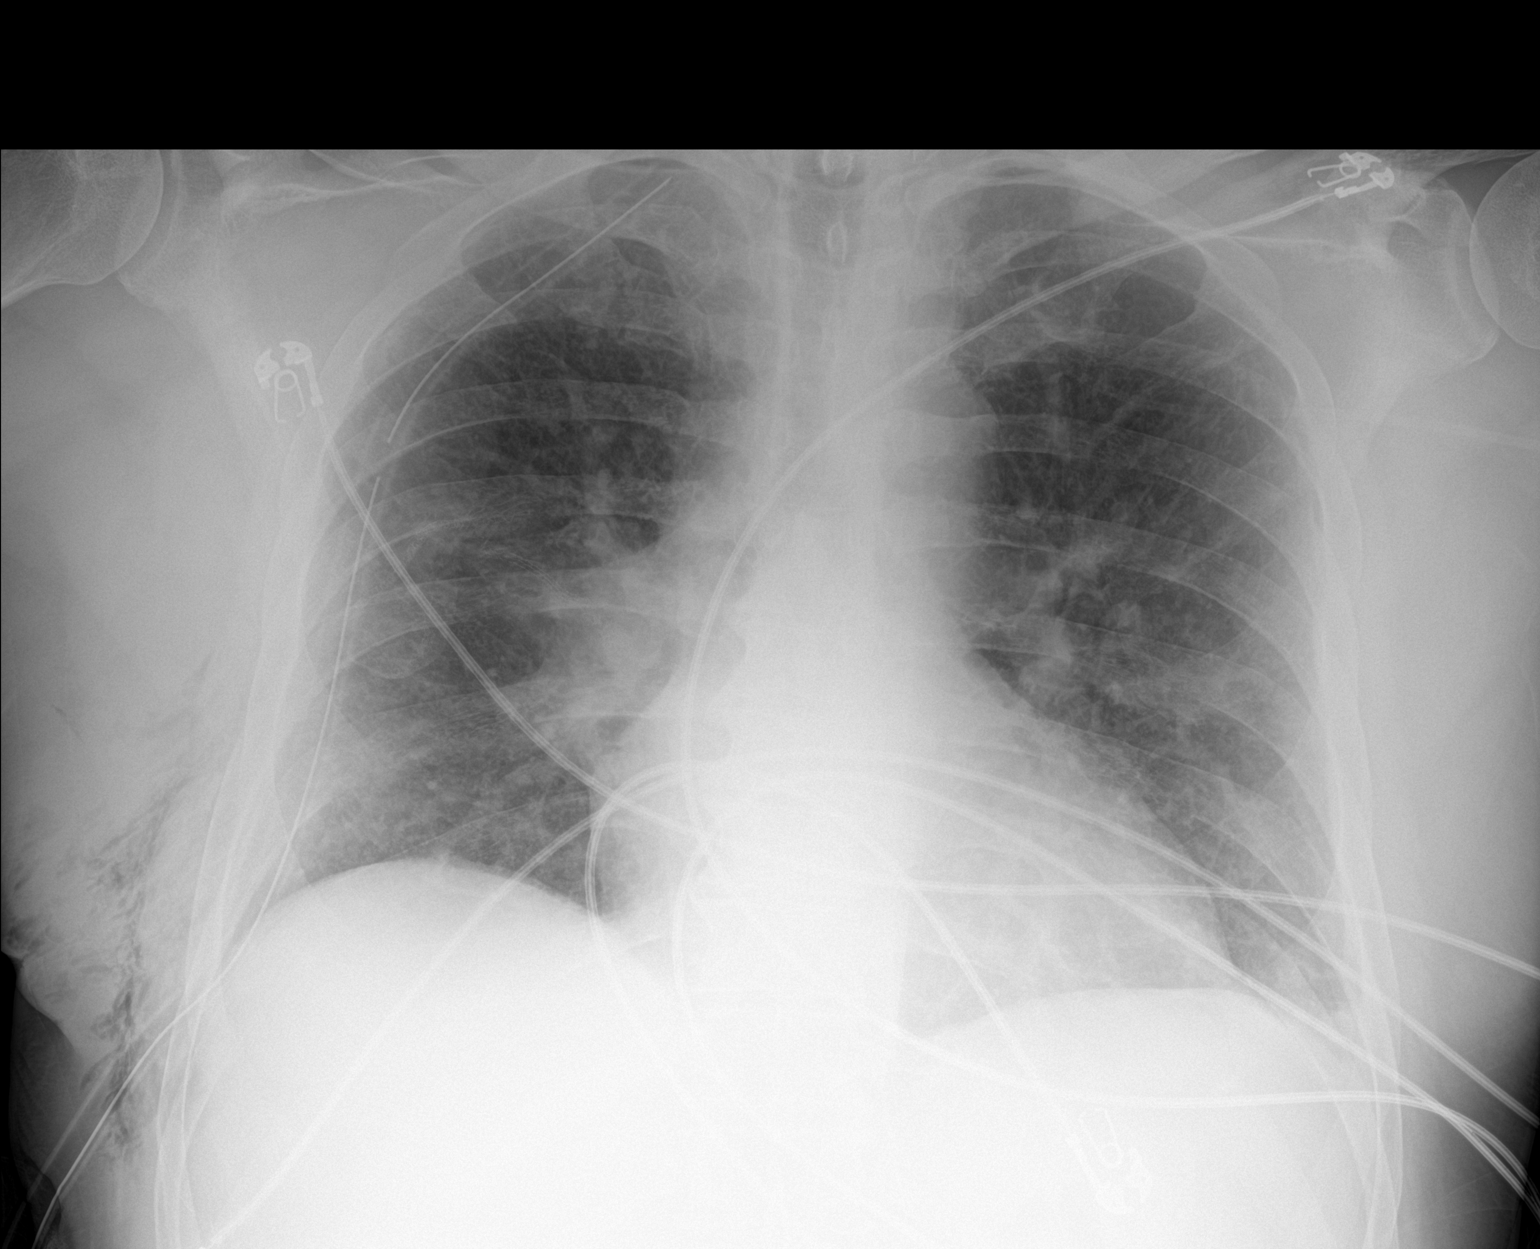

[1 of 1 positions shown; findings below may reference images not displayed]

FINDINGS: Right chest tube in stable position. No pneumothorax identified.
Stable right chest wall subcutaneous emphysema. Stable cardiomegaly.
Stable right lung postsurgical change. Right perihilar atelectatic
changes again noted. No pleural effusion. No acute bony abnormality.
IMPRESSION: Right chest tube in stable position. No pneumothorax. Stable right
chest wall subcutaneous emphysema. Postsurgical changes right lung
unchanged. Mild right perihilar atelectasis again noted.

## 2019-09-23 IMAGING — DX DG CHEST 2V
2 series · 2 of 2 positions shown · non-contrast
Comparison: 12/08/2017

CLINICAL DATA: Status post right VATS and lung biopsy

EXAM:
CHEST - 2 VIEW

[chest pa]
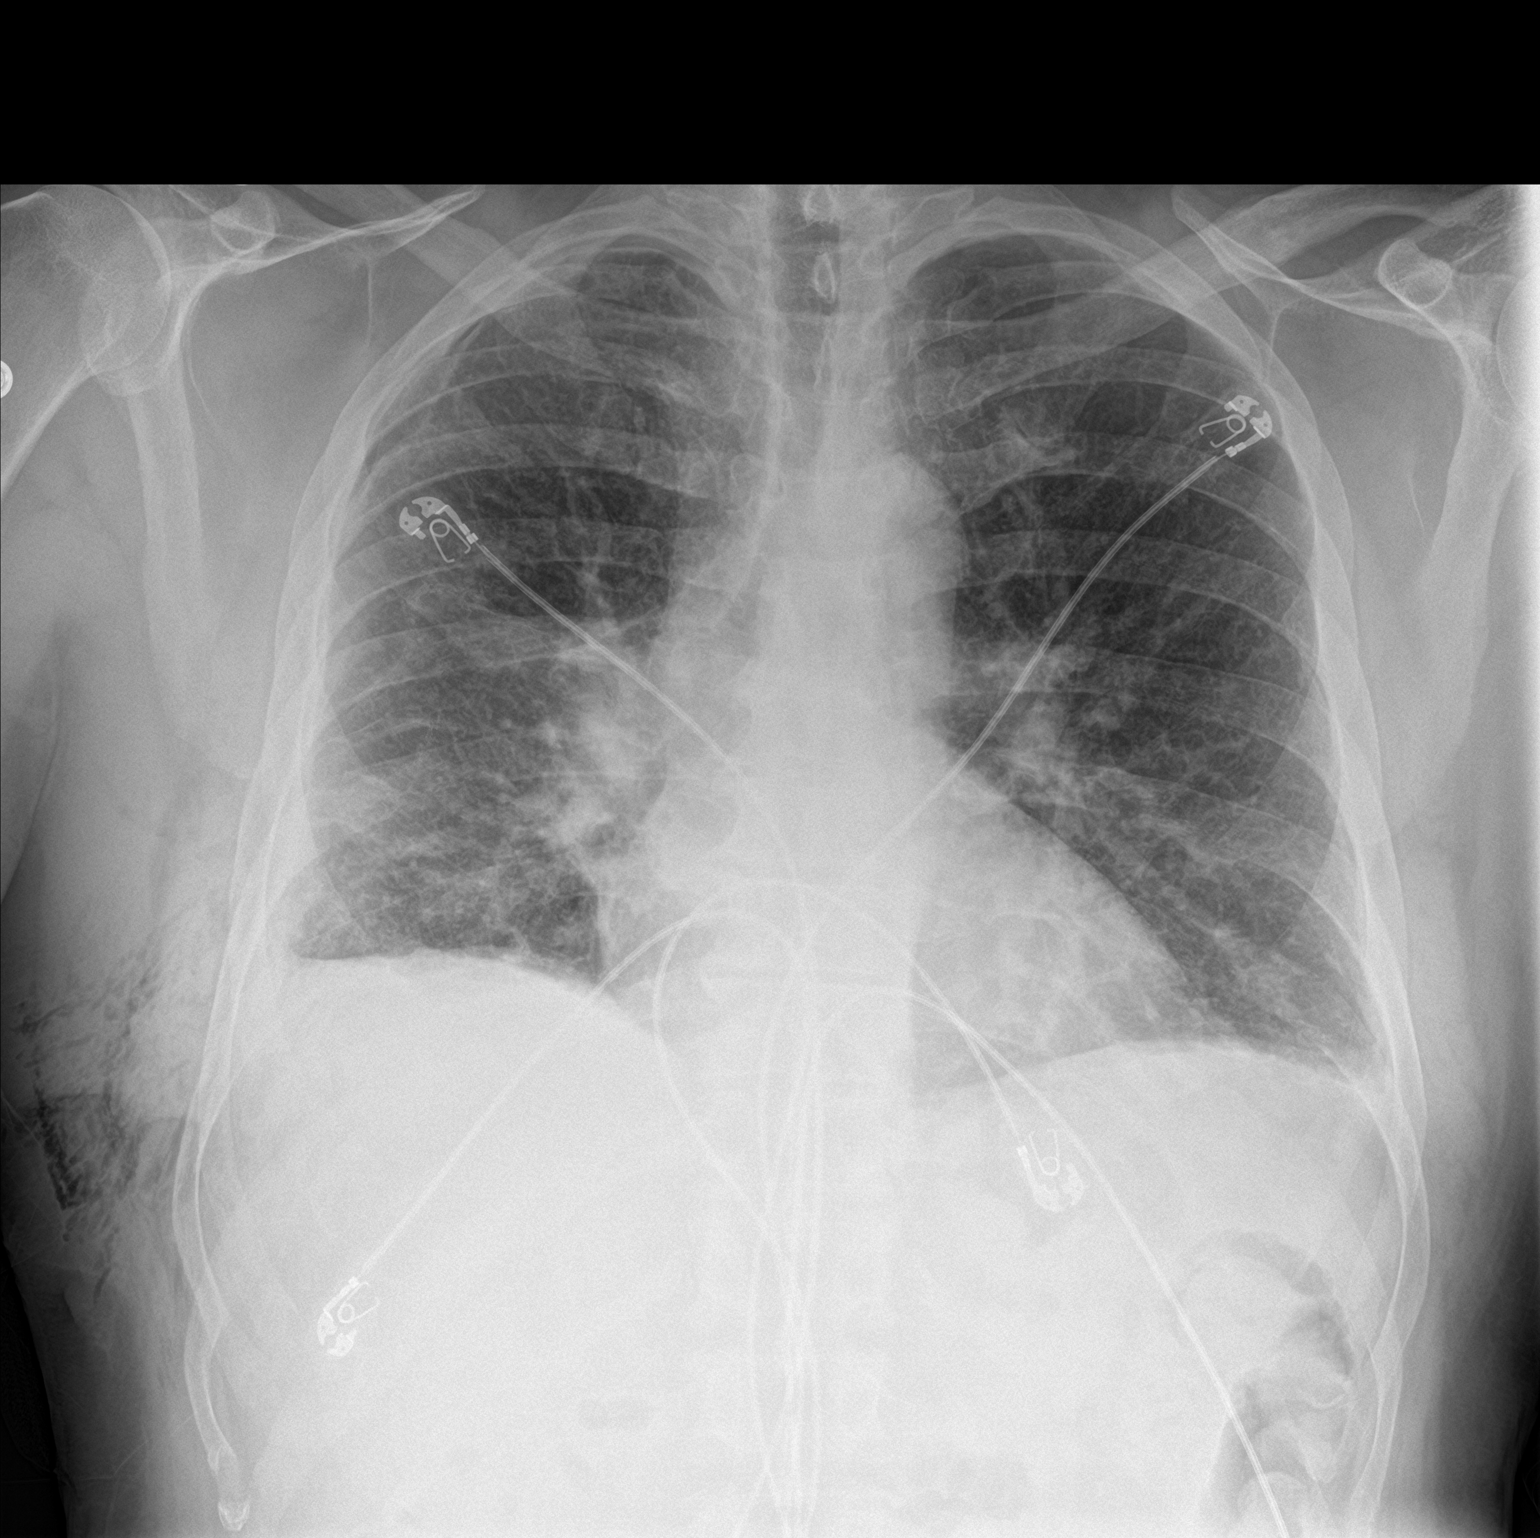

[chest lat]
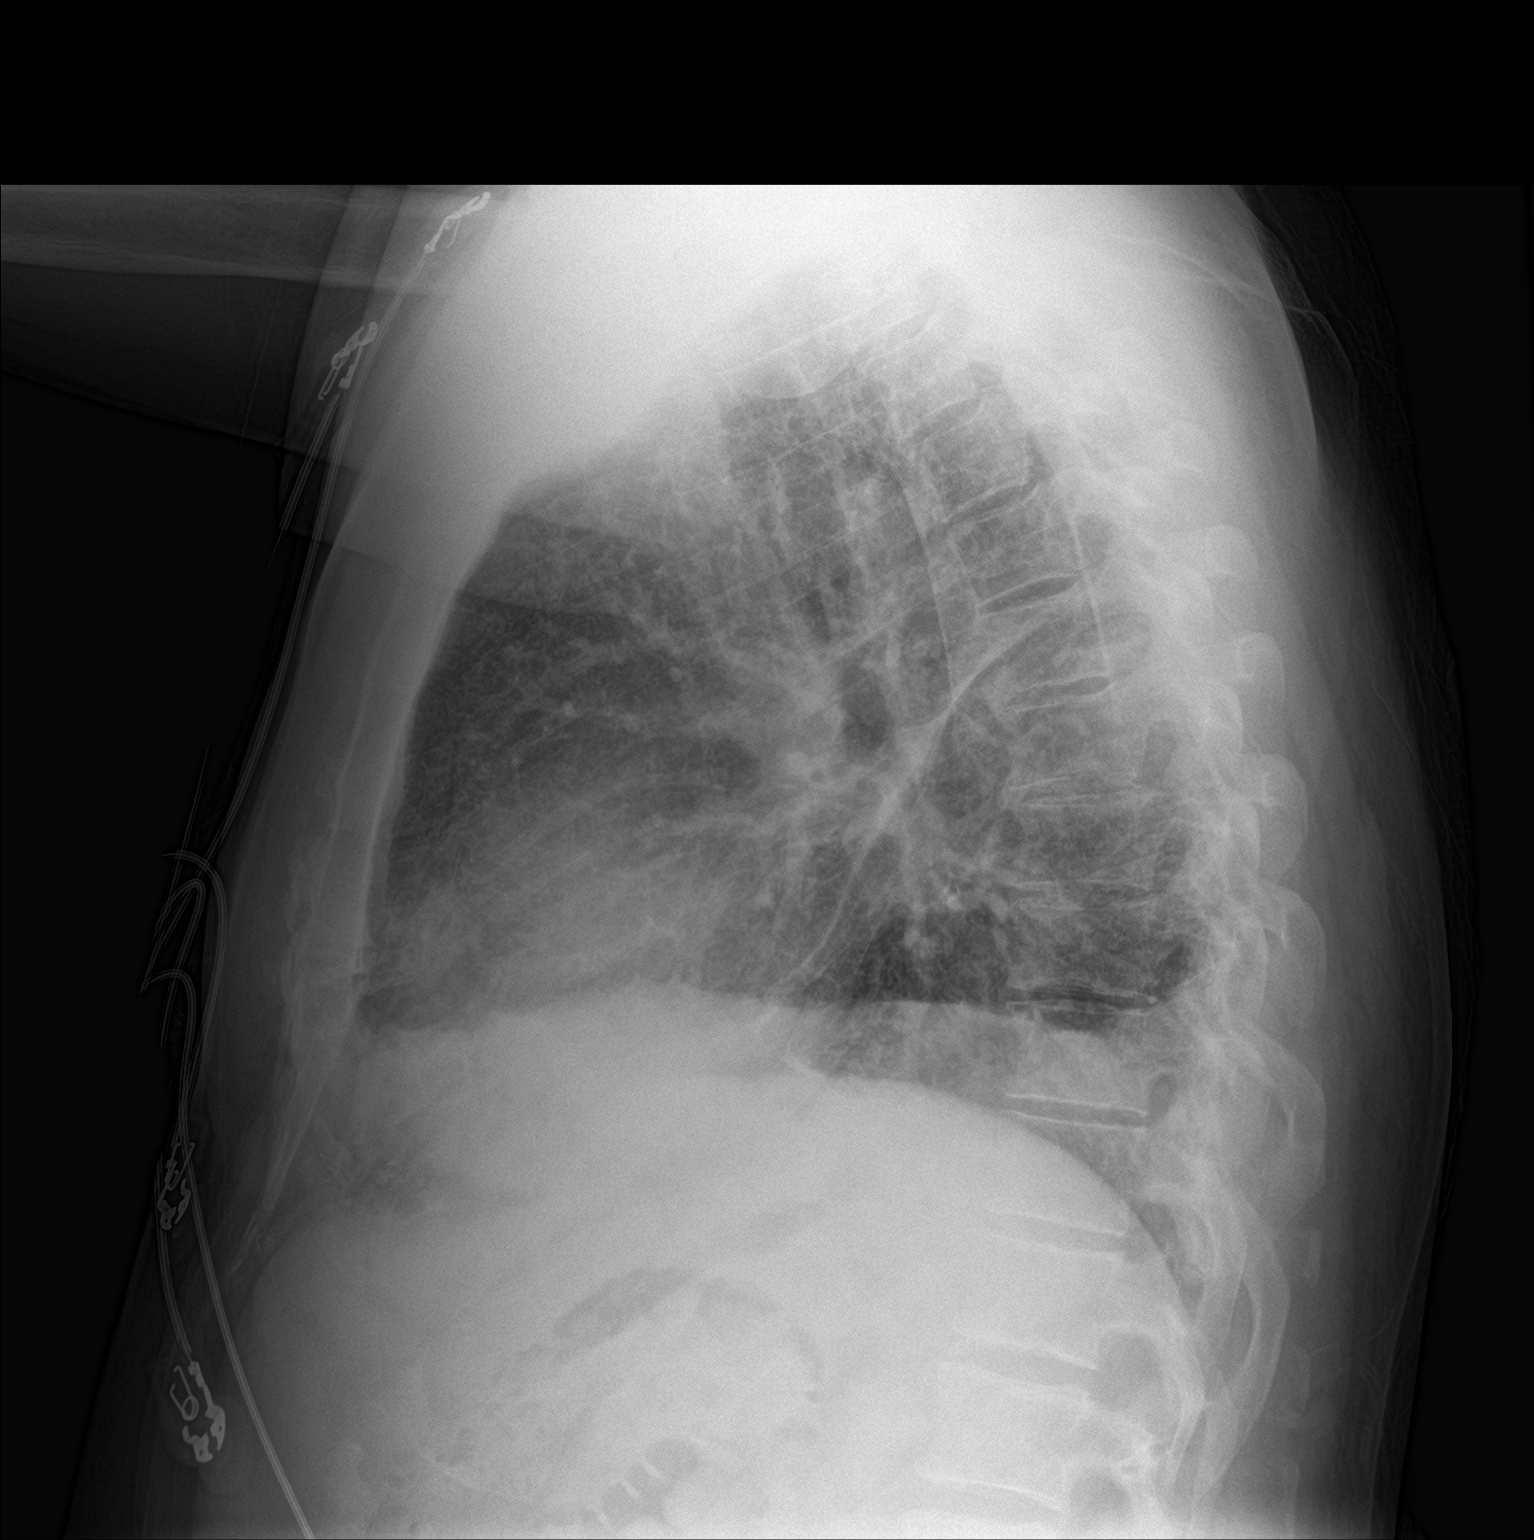

[2 of 2 positions shown; findings below may reference images not displayed]

FINDINGS: Cardiac shadow is stable. Previously seen right-sided chest tube has
been removed in the interval. The tiny right apical pneumothorax is
again identified and stable. No new focal infiltrate is seen. Mild
stable interstitial opacities are seen. No bony abnormality is
noted.
IMPRESSION: Tiny right apical pneumothorax stable from the previous exam.

Stable interstitial opacities bilaterally.

## 2019-10-28 ENCOUNTER — Other Ambulatory Visit: Payer: Self-pay | Admitting: Cardiology

## 2019-12-08 ENCOUNTER — Ambulatory Visit (INDEPENDENT_AMBULATORY_CARE_PROVIDER_SITE_OTHER)
Admission: RE | Admit: 2019-12-08 | Discharge: 2019-12-08 | Disposition: A | Discharge status: Home / Self Care | Payer: Medicare (Managed Care) | Source: Ambulatory Visit | Attending: Acute Care | Admitting: Acute Care

## 2019-12-08 ENCOUNTER — Other Ambulatory Visit: Payer: Self-pay

## 2019-12-08 DIAGNOSIS — F17210 Nicotine dependence, cigarettes, uncomplicated: Secondary | ICD-10-CM

## 2019-12-08 DIAGNOSIS — Z122 Encounter for screening for malignant neoplasm of respiratory organs: Secondary | ICD-10-CM

## 2019-12-08 NOTE — Progress Notes (Signed)
Please call patient and let them  know their  low dose Ct was read as a Lung RADS 1, negative study: no nodules or definitely benign nodules. Radiology recommendation is for a repeat LDCT in 12 months. .Please let them  know we will order and schedule their  annual screening scan for 11/2020. Please let them  know there was notation of CAD on their  scan.  Please remind the patient  that this is a non-gated exam therefore degree or severity of disease  cannot be determined. Please have them  follow up with their PCP regarding potential risk factor modification, dietary therapy or pharmacologic therapy if clinically indicated. Pt.  is  currently on statin therapy. Please place order for annual  screening scan for  01/2020 and fax results to PCP. Thanks so much. Pt. Is followed by Dr. [name] for ILD.

## 2019-12-10 ENCOUNTER — Other Ambulatory Visit: Payer: Self-pay | Admitting: *Deleted

## 2019-12-10 DIAGNOSIS — F17210 Nicotine dependence, cigarettes, uncomplicated: Secondary | ICD-10-CM

## 2019-12-23 ENCOUNTER — Other Ambulatory Visit (HOSPITAL_COMMUNITY)
Admission: RE | Admit: 2019-12-23 | Discharge: 2019-12-23 | Disposition: A | Discharge status: Home / Self Care | Payer: Medicare (Managed Care) | Source: Ambulatory Visit | Attending: Internal Medicine | Admitting: Internal Medicine

## 2019-12-23 DIAGNOSIS — Z20822 Contact with and (suspected) exposure to covid-19: Secondary | ICD-10-CM | POA: Diagnosis not present

## 2019-12-23 DIAGNOSIS — Z01812 Encounter for preprocedural laboratory examination: Secondary | ICD-10-CM | POA: Diagnosis present

## 2019-12-23 LAB — SARS CORONAVIRUS 2 (TAT 6-24 HRS): SARS Coronavirus 2: NEGATIVE

## 2019-12-26 ENCOUNTER — Other Ambulatory Visit: Payer: Self-pay

## 2019-12-26 ENCOUNTER — Ambulatory Visit (INDEPENDENT_AMBULATORY_CARE_PROVIDER_SITE_OTHER): Payer: Medicare (Managed Care) | Admitting: Internal Medicine

## 2019-12-26 DIAGNOSIS — F17210 Nicotine dependence, cigarettes, uncomplicated: Secondary | ICD-10-CM | POA: Diagnosis not present

## 2019-12-26 LAB — PULMONARY FUNCTION TEST
DL/VA % pred: 99 %
DL/VA: 4.26 ml/min/mmHg/L
DLCO cor % pred: 97 %
DLCO cor: 26.6 ml/min/mmHg
DLCO unc % pred: 97 %
DLCO unc: 26.6 ml/min/mmHg
FEF 25-75 Pre: 2.09 L/sec
FEF2575-%Pred-Pre: 70 %
FEV1-%Pred-Pre: 86 %
FEV1-Pre: 3.07 L
FEV1FVC-%Pred-Pre: 95 %
FEV6-%Pred-Pre: 93 %
FEV6-Pre: 4.17 L
FEV6FVC-%Pred-Pre: 103 %
FVC-%Pred-Pre: 90 %
FVC-Pre: 4.24 L
Pre FEV1/FVC ratio: 72 %
Pre FEV6/FVC Ratio: 98 %

## 2019-12-26 NOTE — Progress Notes (Signed)
Spirometry and Dlco done today. 

## 2020-01-01 ENCOUNTER — Other Ambulatory Visit: Payer: Self-pay

## 2020-01-01 ENCOUNTER — Ambulatory Visit (INDEPENDENT_AMBULATORY_CARE_PROVIDER_SITE_OTHER): Payer: Medicare (Managed Care) | Admitting: Internal Medicine

## 2020-01-01 ENCOUNTER — Encounter: Payer: Self-pay | Admitting: Internal Medicine

## 2020-01-01 ENCOUNTER — Telehealth: Payer: Self-pay | Admitting: Internal Medicine

## 2020-01-01 VITALS — BP 120/80 | HR 91 | Temp 98.2°F | Ht 70.0 in | Wt 226.0 lb

## 2020-01-01 DIAGNOSIS — Z7185 Encounter for immunization safety counseling: Secondary | ICD-10-CM | POA: Diagnosis not present

## 2020-01-01 DIAGNOSIS — J849 Interstitial pulmonary disease, unspecified: Secondary | ICD-10-CM

## 2020-01-01 DIAGNOSIS — Z23 Encounter for immunization: Secondary | ICD-10-CM | POA: Diagnosis not present

## 2020-01-01 DIAGNOSIS — F17210 Nicotine dependence, cigarettes, uncomplicated: Secondary | ICD-10-CM | POA: Diagnosis not present

## 2020-01-01 NOTE — Telephone Encounter (Signed)
HHi Robert Greene got LDCT 12/08/19. Because he has ILD , I think I will just take over doing HRCT once a year. I do not see value in LDCT   Is that ok?  Thanks  MR \

## 2020-01-01 NOTE — Telephone Encounter (Signed)
That is your call totally. If you would prefer to remove the patient from the screening program and manage his screenings with HRCT we understand.   Langley Gauss and Rodena Piety, Dr. [name] is going to manage this patient's Lung Cancer Screenings  with HRCT's annually. Please remove him from the lung cancer screening program at [facility] request.   Thanks

## 2020-01-01 NOTE — Patient Instructions (Addendum)
ILD (interstitial lung disease) ([place])  -Currently stable and this is due to smoking call smoking-related interstitial fibrosis [S RIF] -Continued smoking is going to put you at risk for sudden progression but we are lucky that it is stable so far =- respect rehab referral  Plan -6 months do spirometry and dlco - return in 6 months - aim for HRCT in Nov /Dec 2022  Moderate cigarette smoker (10-19 per day)  -Too bad this has been relapsed for 1.5  years  Plan -Respect your decision not to take Zyban -I hope you have quit smoking in the next few months - replace LDCT screen once a year with HRCT once a yerr - next Nov 2022 - Dr [name] will coordinate Vaccine counseling  -respect covid vaccine hesitancy - you will benefit from pneumovax  - Plan -Pneumovax vaccine 01/01/2020  -  let CMA know to update your flu vaccine record - consider Covid vaccine when you feel comfortable with data   Follow-up -6 months dodo spirometry and DLCO -Return to see Dr. [name] for a 30-minute face-to-face visit in ILD clinic or any day in 6 months after the test

## 2020-01-01 NOTE — Progress Notes (Signed)
OV 03/27/2017 - ILD CLINIC  Chief Complaint  Patient presents with  . Consult    Consult per Robert Form, NP for ILD seen on HRCT 03/22/17.  Pt states he has occ. SOB, and sinus problems. Denies any cough or CP.   59 year old male referred for interstitial lung disease.  He is known to have rheumatoid arthritis for a few years and is on Humira.  He is also a heavy smoker.  He underwent screening low-dose CT scan of the chest and of January 2019 and this showed interstitial lung disease.  This was followed up by high-resolution CT chest which has confirmed interstitial lung disease.  According to thoracic radiology it is indeterminate for UIP.  There is also associated emphysema.  Therefore has been referred to interstitial lung disease clinic  American College of chest physicians interstitial lung disease questionnaire  : He has symptoms of cough occasionally but not bothersome for several years.  It is cough at night and it is a dry cough without any sputum.  He has mild shortness of breath when hurrying on level ground or walking up a slight hill.  This is been going on for 2 years without any change.  In terms of his past medical history he has some arthralgia which is due to rheumatoid arthritis and is on Humira.  He has acid reflux disease.  Otherwise past medical history is negative  In terms of personal exposure history: He is a heavy smoker started smoking at age 13.  Smokes 20 cigarettes a day.  He is trying to quit.  He does not want the help of Chantix or Zyban.  He is trying to quit on his own by reducing the number of cigarettes he smokes each day.  He does not use any street drugs.  In terms of family history of lung disease: Positive for COPD.  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  He thinks his father-in-law got this from paint exposure.  There is no pulmonary fibrosis and the blood family  Home  exposure history: Negative for humidifier [place] or hot tub or Jacuzzi O.  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  So he gets both bleach exposure and mold exposure.  He gets exposed at least 4 homes each year for the last 35 years.  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  There were around 40 pigeons and a small 100 square feet room.  He would visit these patients once a week for 2 years.  He would then feed them and pick them up to race.  During this time he got exposed to significant amount of bird feathers and toilet.  Occupational history: As a Curator as above involving bleach, paint fumes and mold  Pulmonary toxicity history: Negative.  Denies any amiodarone or beta really mL cobalt or radiation or chemotherapy or BCG or nitrofurantoin exposure  Walking desaturation test on 03/27/2017 185 feet x 3 laps on ROOM AIR:  did not desaturate. Rest pulse ox was 100%, final pulse ox was 98%. HR response 63/min at rest to 86/min at peak exertion. Patient Robert Greene  Did not Desaturate < 88% .  Robert Greene did not  Desaturated </= 3% points. Robert Greene did not get tachyardic   IMPRESSION: 1. The appearance of the lungs is compatible with interstitial lung disease, with a CT pattern considered indeterminate for UIP (usual interstitial pneumonia). 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 2. Mild diffuse bronchial wall thickening with mild centrilobular and paraseptal emphysema; imaging findings suggestive of underlying COPD. 3. Aortic atherosclerosis, in addition to left anterior  descending coronary artery disease. Please note that although the presence of coronary artery calcium documents the presence of coronary artery disease, the severity of this disease and any potential stenosis cannot be assessed on this non-gated CT examination. Assessment for potential risk factor modification, dietary therapy or pharmacologic therapy may be warranted, if clinically indicated.  Aortic Atherosclerosis (ICD10-I70.0) and Emphysema (ICD10-J43.9).   Electronically Signed   By: Robert Greene M.D.   On: 03/22/2017 10:25      OV 09/26/2017  Subjective:  Patient ID: Robert Greene, male , DOB: 10/15/1960 , age MRN: [id number] , ADDRESS: [place] [id number]   09/26/2017 -   Chief Complaint  Patient presents with  . Follow-up    HRCT performed 8/15 and PFT performed today. Pt states he will have occ SOB but denies any real complaints.     HPI Robert Greene 59 y.o. -presents for shortness of breath and ILD evaluation. In the interim he has seen Dr. Harriet Pho. He had a cardiac stress test in March 2019 actually and this was normal with an ejection fraction of 60%. He has seen Dr. Amil Amen of rheumatology 05/08/2017 and I reviewed this note.Diagnosis is polymyalgia rheumatica and seronegative rheumatoid arthritis. He has a history of heavy alcohol use.Marland Kitchen Appears July 2016 rheumatoid factor, CCP, ANA and IgM were negative. In this visit he was supposed to quit smoking and reevaluate. However he has not been able to quit smoking. He continues to spray paint. Continues to be exposed to pigeons. His primary function test ironically is normal as is the walk test but his high resolution CT chest August 2019 that I personally visualized the show ILD. Based on 2018 ATS criteria and mypersonal visualization opinion I think the pattern is indeterminate although the most recent radiologist has called this is probable UIP.     IMPRESSION:  1. Spectrum of findings  suggestive of a mild fibrotic interstitial lung disease with slight basilar gradient and no frank honeycombing. 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 No appreciable progression since 02/14/2017 chest CT. 2. One vessel coronary atherosclerosis. 3. Mild diffuse hepatic steatosis.  Aortic Atherosclerosis (ICD10-I70.0) and Emphysema (ICD10-J43.9).   Electronically Signed   By: Ilona Sorrel M.D.   On: 09/06/2017 10:53   OV 11/22/2017  Subjective:  Patient ID: Robert Greene, male , DOB: 09-Oct-1960 , age 34 y.o. , MRN: [id number] , ADDRESS: [place] [address]   11/22/2017 -   Chief Complaint  Patient presents with  . Follow-up    States his breathing is the same at his baseline. No new concerns.      HPI Robert Greene 59 y.o. -interstitial lung disease not otherwise specified.  Here for follow-up.  This visit was supposed to be after surgical lung biopsy.  However he visited with Dr. Roxan Hockey and decided  to defer the date of the biopsy because he wanted to go on disability.  He now is on disability and feels he is ready to have surgical lung biopsy.  He says he will call Dr. Roxan Hockey and make an appointment.  In the interim he feels he is mildly more short of breath especially before inclines and steps than before.  Otherwise no other new problems.  Immunization record shows that he could benefit from Pneumovax.  He is up-to-date with the flu shot.  Walking desaturation test suggest possible worsening and tachycardia than before       OV 03/12/2018  Subjective:  Patient ID: Robert Greene, male , DOB: 11/20/1960 , age 59 y.o. , MRN: [id number] , ADDRESS: [place] [address]   03/12/2018 -   Chief Complaint  Patient presents with  . Follow-up    PFT performed today. Pt states breathing is about the same since last visit and states he has had some chest congestion.    Follow-up biopsy-proven smoking-related  interstitial lung fibrosis #14 2019  HPI Robert Greene 59 y.o. -returns for follow-up.  He says that he has quit smoking now for 4 months.  Overall he feels stable.  He had pulmonary function test today that shows a decline in FVC but stability and DLCO value.  He feels the same.  He does not believe that the [facility] decline is real.  His symptom scores are reported below and minimal.  He is gaining some weight because of quitting smoking.  He is up-to-date with his flu shot.      Results for MICHEIL, KLAUS (MRN [id number]) as of 03/12/2018 10:11  Ref. Range 09/26/2017 09:45 01/04/2018 11:05 03/12/2018 09:01  FVC-Pre Latest Units: L 4.32 3.95 3.69  FVC-%Pred-Pre Latest Units: % 91 83 77   Results for NAMARI, BRETON (MRN [id number]) as of 03/12/2018 10:11  Ref. Range 09/26/2017 09:45 01/04/2018 11:05 03/12/2018 09:01-GL i.e. equation  DLCO unc Latest Units: ml/min/mmHg 30.39 25.65 29.08  DLCO unc % pred Latest Units: % 97 81 105     OV 06/26/2019  Subjective:  Patient ID: Robert Greene, male , DOB: Aug 02, 1960 , age 66 y.o. , MRN: [id number] , ADDRESS: [place] [id number]   06/26/2019 -   Chief Complaint  Patient presents with  . Follow-up    sob with yard work.  cough from sinus.  has post nasal drip.   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 -presents for follow-up of the same.  He is on observation therapy given stability in lung function.  Last seen before the pandemic.  After that discussion ILD conference confirmed smoking-related interstitial fibrosis.  He says he was doing well.  Overall he says shortness of breath is the same.  I will do symptom questionnaire shows a two-point variation and dyspnea..  This could not be clinically significant.  His simple walking desaturation test is stable.  His pulmonary function test done today is stable.  However he is relapsed with the smoking.  This happened in May 2020.  He  says he did smoke 1 cigarette after a craving and then he relapsed.  His wife is also smoking but she is quitting.  She is down to 2 cigarettes/day.  He is refused help with Chantix or Zyban.  He says he will do it out of willpower and he will do it along with his wife in terms of quitting  smoking.  He did have a low-dose CT scan of the chest a #2020 for lung cancer screening.  In this the ILD is noted to be stable.  He is not had his Covid vaccine.  He says that is not in of long-term safety data.  He is just happy isolating.  He says he might consider Covid vaccine once his long-term safety data.  He is not satisfied with the short-term safety data and the current safety data that is available worldwide.    OV 01/01/2020   Subjective:  Patient ID: Robert Greene, male , DOB: [birth year], age 70 y.o. years. , MRN: [id number],  ADDRESS: [address] [place] [address] PCP  [name], MD Providers : Treatment Team:  Attending Provider: Brand Males, MD Patient Care Team: [name], MD as PCP - General (Family Medicine)    Chief Complaint  Patient presents with  . Follow-up    ILD, doing well       HPI Robert Greene 59 y.o. 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  Overall he is stable as documented in the symptom score below.  He continues to smoke.  In fact he smoking half pack cigarettes a day.  In terms of his vaccination he has had Prevnar but not Pneumovax.  He is willing to have Pneumovax.  He says he has had his flu shot but the records do not seem updated for this.  He again refused Covid vaccine.  His symptom score and PFTs are stable.  He did not have high-resolution CT chest but instead he had low-dose CT chest for lung cancer screening.  But at least looking at symptom score walk test and desaturation test everything is stable in terms of his ILD.  He complains of some dyspnea on exertion as documented below.  We offered him pulmonary rehabilitation but  he refused.  He says he is working out enough in the yard.    SYMPTOM SCALE - ILD 03/12/2018  06/26/2019  01/01/2020   O2 use ra  ra  Shortness of Breath 0 -> 5 scale with 5 being worst (score 6 If unable to do)    At rest 1 1 1   Simple tasks - showers, clothes change, eating, shaving 0* 1 2  Household (dishes, doing bed, laundry) 1* 2 2  Shopping 0 1 1  Walking level at own pace 1 2 1   Walking up Stairs 2 3 3   Total (40 - 48) Dyspnea Score 8 10 10   How bad is your cough? 1 1 1   How bad is your fatigue 1 1 1   nausea  1 2  vomit  0 0  diarrhea  0 0  anxiety  1 0  depression  0 0       Simple office walk 185 feet x  3 laps goal with forehead probe 09/26/2017  11/22/2017  03/12/2018  /yf  06/26/2019   O2 used Room air Room air Room air  Room air  Number laps completed 3 3 3  x 185 feet  3 x 185  Comments about pace Normal to mod pace  normal  nl  Resting Pulse Ox/HR 99% and 69/min 100% and 73/min 99% and 68/min 96% annd 91/min 100% and 69  Final Pulse Ox/HR 99% and 89/min 100% and 97/min 97% and 88/min 96% and 101 100% and 91/min  Desaturated </= 88% no no no  no  Desaturated <= 3% points no no no  no  Got Tachycardic >/=  90/min No but almost yes no  yes  Symptoms at end of test no x none  Mild dyspnea  Miscellaneous comments x x x       Results for FERNAND, SORBELLO (MRN [id number]) as of 06/26/2019 11:49  Ref. Range 09/26/2017 10:17 01/04/2018 11:05 03/12/2018 09:01 06/16/2019 08:51 12/26/19  FVC-Pre Latest Units: L 4.32 3.95 3.69 4.14 4.24  FVC-%Pred-Pre Latest Units: % 91 83 77 88 90   Results for NNAEMEKA, SAMSON (MRN [id number]) as of 06/26/2019 11:49  Ref. Range 09/26/2017 10:17 01/04/2018 11:05 03/12/2018 09:01 06/16/2019 08:51 01/01/2020   DLCO unc Latest Units: ml/min/mmHg 30.39 25.65 29.08 28.02 26.6  DLCO unc % pred Latest Units: % 97 81 105 103 97%    CT chest LDCT 12/08/19   IMPRESSION: 1. Lung-RADS 1, negative. Continue annual screening with low-dose chest CT  without contrast in 12 months. 2. Basilar predominant subpleural fibrosis, suboptimally characterized with low-dose technique. Nonspecific interstitial pneumonitis or usual interstitial pneumonitis could have this appearance. Patient underwent lung biopsy 12/06/2017. 3. Aortic atherosclerosis (ICD10-I70.0). Coronary artery calcification. 4.  Emphysema (ICD10-J43.9).   Electronically Signed   By: Lorin Picket M.D.   On: 12/08/2019 11:24    has a past medical history of Anxiety, Arthritis, Coronary artery calcification seen on CAT scan (04/08/2018), Dyspnea, HTN (hypertension) (04/08/2018), Hypercholesteremia, Hypercholesteremia, Hypertension, and Pulmonary fibrosis ([place]).   reports that he has been smoking cigarettes. He has smoked for the past 40.00 years. He has never used smokeless tobacco.  Past Surgical History:  Procedure Laterality Date  . JOINT REPLACEMENT     x2 scopes not replacement  . LUNG BIOPSY Right 12/06/2017   Procedure: LUNG BIOPSY;  Surgeon: Melrose Nakayama, MD;  Location: [facility];  Service: Thoracic;  Laterality: Right;  Marland Kitchen VIDEO ASSISTED THORACOSCOPY Right 12/06/2017   Procedure: VIDEO ASSISTED THORACOSCOPY;  Surgeon: Melrose Nakayama, MD;  Location: [facility];  Service: Thoracic;  Laterality: Right;    Allergies  Allergen Reactions  . Bee Venom Anaphylaxis, Hives and Rash  . Spider Antivenin [Black Widow Spider Antivenin (L.Mactans)] Anaphylaxis, Hives and Rash    Immunization History  Administered Date(s) Administered  . Influenza,inj,Quad PF,6+ Mos 09/26/2017, 09/01/2018  . Pneumococcal Conjugate-13 11/22/2017  . Zoster 06/24/2019    History reviewed. No pertinent family history.   Current Outpatient Medications:  .  amLODipine (NORVASC) 5 MG tablet, TAKE 1 TABLET BY MOUTH EVERY DAY, Disp: 90 tablet, Rfl: 0 .  atorvastatin (LIPITOR) 80 MG tablet, Take 80 mg by mouth daily., Disp: , Rfl: 1 .  diphenhydrAMINE (BENADRYL) 25 MG tablet, Take  25 mg by mouth daily as needed for itching. , Disp: , Rfl:  .  EPINEPHrine 0.3 mg/0.3 mL IJ SOAJ injection, Use once.  If ineffective, use 2nd dose., Disp: 2 Device, Rfl: 0 .  etanercept (ENBREL) 50 MG/ML injection, Inject 50 mg into the skin once a week., Disp: , Rfl:  .  hydrochlorothiazide (HYDRODIURIL) 12.5 MG tablet, TAKE 1 TABLET BY MOUTH EVERY DAY, Disp: 90 tablet, Rfl: 1 .  hydroxychloroquine (PLAQUENIL) 200 MG tablet, Take 200 mg by mouth daily. , Disp: , Rfl: 2 .  ketorolac (TORADOL) 10 MG tablet, Take 1 tablet (10 mg total) by mouth every 6 (six) hours as needed., Disp: 20 tablet, Rfl: 0 .  losartan (COZAAR) 100 MG tablet, TAKE 1 TABLET BY MOUTH EVERY DAY, Disp: 90 tablet, Rfl: 1 .  Omega-3 Fatty Acids (FISH OIL PO), Take 2 capsules by mouth 2 (two)  times daily. , Disp: , Rfl:  .  ondansetron (ZOFRAN ODT) 4 MG disintegrating tablet, Take 1 tablet (4 mg total) by mouth every 8 (eight) hours as needed for nausea or vomiting., Disp: 21 tablet, Rfl: 0      Objective:   Vitals:   01/01/20 0912  BP: 120/80  Pulse: 91  Temp: 98.2 F (36.8 C)  TempSrc: Oral  SpO2: 96%  Weight: 226 lb (102.5 kg)  Height: 5\' 10"  (1.778 m)    Estimated body mass index is 32.43 kg/m as calculated from the following:   Height as of this encounter: 5\' 10"  (1.778 m).   Weight as of this encounter: 226 lb (102.5 kg).  @WEIGHTCHANGE @  [employer]   01/01/20 0912  Weight: 226 lb (102.5 kg)     Physical Exam General: No distress. Looks well Neuro: Alert and Oriented x 3. GCS 15. Speech normal Psych: Pleasant Resp:  Barrel Chest - no.  Wheeze - no, Crackles - yes t base, No overt respiratory distress CVS: Normal heart sounds. Murmurs - no Ext: Stigmata of Connective Tissue Disease - no HEENT: Normal upper airway. PEERL +. No post nasal drip        Assessment:       ICD-10-CM   1. ILD (interstitial lung disease) ([facility])  J84.9 Pulmonary function test       Plan:     Patient  Instructions  ILD (interstitial lung disease) ([place])  -Currently stable and this is due to smoking call smoking-related interstitial fibrosis [S RIF] -Continued smoking is going to put you at risk for sudden progression but we are lucky that it is stable so far =- respect rehab referral  Plan -6 months do spirometry and dlco - return in 6 months - aim for HRCT in Nov /Dec 2022  Moderate cigarette smoker (10-19 per day)  -Too bad this has been relapsed for 1.5  years  Plan -Respect your decision not to take Zyban -I hope you have quit smoking in the next few months - replace LDCT screen once a year with HRCT once a yerr - next Nov 2022 - Dr [name] will coordinate Vaccine counseling  -respect covid vaccine hesitancy - you will benefit from pneumovax  - Plan -Pneumovax vaccine 01/01/2020  -  let CMA know to update your flu vaccine record - consider Covid vaccine when you feel comfortable with data   Follow-up -6 months dodo spirometry and DLCO -Return to see Dr. [name] for a 30-minute face-to-face visit in ILD clinic or any day in 6 months after the test       SIGNATURE    Dr. Brand Males, M.D., F.C.C.P,  Pulmonary and Critical Care Medicine Staff Physician, [place] Director - Interstitial Lung Disease  Program  Pulmonary [place] at [place], [place], [address]  Pager: [phone], If no answer or between  15:00h - 7:00h: call [phone] Telephone: [phone]  9:39 AM 01/01/2020

## 2020-01-02 ENCOUNTER — Other Ambulatory Visit: Payer: Self-pay | Admitting: Internal Medicine

## 2020-01-02 DIAGNOSIS — J849 Interstitial pulmonary disease, unspecified: Secondary | ICD-10-CM

## 2020-01-02 NOTE — Telephone Encounter (Signed)
Ok thanks. I will take care of it

## 2020-02-17 ENCOUNTER — Other Ambulatory Visit: Payer: Self-pay | Admitting: Cardiology

## 2020-03-02 ENCOUNTER — Other Ambulatory Visit: Payer: Self-pay | Admitting: Cardiology

## 2020-03-17 ENCOUNTER — Other Ambulatory Visit: Payer: Self-pay | Admitting: Cardiology

## 2020-06-01 ENCOUNTER — Other Ambulatory Visit: Payer: Self-pay | Admitting: Cardiology

## 2020-07-05 ENCOUNTER — Other Ambulatory Visit: Payer: Self-pay | Admitting: Cardiology

## 2020-07-27 ENCOUNTER — Other Ambulatory Visit (HOSPITAL_COMMUNITY)
Admission: RE | Admit: 2020-07-27 | Discharge: 2020-07-27 | Disposition: A | Discharge status: Home / Self Care | Payer: Medicare Other | Source: Ambulatory Visit | Attending: Internal Medicine | Admitting: Internal Medicine

## 2020-07-27 DIAGNOSIS — Z01812 Encounter for preprocedural laboratory examination: Secondary | ICD-10-CM | POA: Insufficient documentation

## 2020-07-27 DIAGNOSIS — Z20822 Contact with and (suspected) exposure to covid-19: Secondary | ICD-10-CM | POA: Insufficient documentation

## 2020-07-28 LAB — SARS CORONAVIRUS 2 (TAT 6-24 HRS): SARS Coronavirus 2: NEGATIVE

## 2020-07-30 ENCOUNTER — Other Ambulatory Visit: Payer: Self-pay

## 2020-07-30 ENCOUNTER — Ambulatory Visit (INDEPENDENT_AMBULATORY_CARE_PROVIDER_SITE_OTHER): Payer: Medicare Other | Admitting: Internal Medicine

## 2020-07-30 DIAGNOSIS — J849 Interstitial pulmonary disease, unspecified: Secondary | ICD-10-CM

## 2020-07-30 LAB — PULMONARY FUNCTION TEST
DL/VA % pred: 103 %
DL/VA: 4.39 ml/min/mmHg/L
DLCO cor % pred: 95 %
DLCO cor: 25.83 ml/min/mmHg
DLCO unc % pred: 95 %
DLCO unc: 25.83 ml/min/mmHg
FEF 25-75 Pre: 2.11 L/sec
FEF2575-%Pred-Pre: 72 %
FEV1-%Pred-Pre: 85 %
FEV1-Pre: 2.99 L
FEV1FVC-%Pred-Pre: 96 %
FEV6-%Pred-Pre: 92 %
FEV6-Pre: 4.1 L
FEV6FVC-%Pred-Pre: 104 %
FVC-%Pred-Pre: 88 %
FVC-Pre: 4.1 L
Pre FEV1/FVC ratio: 73 %
Pre FEV6/FVC Ratio: 100 %

## 2020-07-30 NOTE — Progress Notes (Signed)
Spirometry/DLCO performed today. 

## 2020-07-30 NOTE — Patient Instructions (Addendum)
Spirometry/DLCO performed today. 

## 2020-08-03 DIAGNOSIS — R21 Rash and other nonspecific skin eruption: Secondary | ICD-10-CM | POA: Diagnosis not present

## 2020-08-28 ENCOUNTER — Other Ambulatory Visit: Payer: Self-pay | Admitting: Cardiology

## 2020-09-04 ENCOUNTER — Other Ambulatory Visit: Payer: Self-pay | Admitting: Cardiology

## 2020-09-06 ENCOUNTER — Other Ambulatory Visit: Payer: Self-pay | Admitting: Cardiology

## 2020-09-06 DIAGNOSIS — K922 Gastrointestinal hemorrhage, unspecified: Secondary | ICD-10-CM | POA: Diagnosis not present

## 2020-09-06 DIAGNOSIS — M25561 Pain in right knee: Secondary | ICD-10-CM | POA: Diagnosis not present

## 2020-09-06 DIAGNOSIS — R21 Rash and other nonspecific skin eruption: Secondary | ICD-10-CM | POA: Diagnosis not present

## 2020-09-11 ENCOUNTER — Other Ambulatory Visit: Payer: Self-pay | Admitting: Student

## 2020-09-13 DIAGNOSIS — R7309 Other abnormal glucose: Secondary | ICD-10-CM | POA: Diagnosis not present

## 2020-09-13 DIAGNOSIS — K219 Gastro-esophageal reflux disease without esophagitis: Secondary | ICD-10-CM | POA: Diagnosis not present

## 2020-09-13 DIAGNOSIS — K922 Gastrointestinal hemorrhage, unspecified: Secondary | ICD-10-CM | POA: Diagnosis not present

## 2020-09-13 DIAGNOSIS — G4700 Insomnia, unspecified: Secondary | ICD-10-CM | POA: Diagnosis not present

## 2020-09-21 ENCOUNTER — Ambulatory Visit: Payer: Medicare Other | Admitting: Internal Medicine

## 2020-10-02 ENCOUNTER — Other Ambulatory Visit: Payer: Self-pay | Admitting: Cardiology

## 2020-10-18 ENCOUNTER — Other Ambulatory Visit: Payer: Self-pay

## 2020-10-18 ENCOUNTER — Encounter: Payer: Self-pay | Admitting: Dermatology

## 2020-10-18 ENCOUNTER — Ambulatory Visit: Payer: Medicare Other | Admitting: Dermatology

## 2020-10-18 DIAGNOSIS — L918 Other hypertrophic disorders of the skin: Secondary | ICD-10-CM

## 2020-10-18 DIAGNOSIS — R21 Rash and other nonspecific skin eruption: Secondary | ICD-10-CM | POA: Diagnosis not present

## 2020-10-18 DIAGNOSIS — L821 Other seborrheic keratosis: Secondary | ICD-10-CM

## 2020-10-22 ENCOUNTER — Encounter: Payer: Self-pay | Admitting: Internal Medicine

## 2020-10-22 ENCOUNTER — Ambulatory Visit (INDEPENDENT_AMBULATORY_CARE_PROVIDER_SITE_OTHER): Payer: Medicare Other | Admitting: Internal Medicine

## 2020-10-22 ENCOUNTER — Other Ambulatory Visit: Payer: Self-pay

## 2020-10-22 VITALS — BP 132/76 | HR 63 | Temp 98.3°F | Wt 212.4 lb

## 2020-10-22 DIAGNOSIS — Z23 Encounter for immunization: Secondary | ICD-10-CM | POA: Diagnosis not present

## 2020-10-22 DIAGNOSIS — J849 Interstitial pulmonary disease, unspecified: Secondary | ICD-10-CM | POA: Diagnosis not present

## 2020-10-22 NOTE — Progress Notes (Signed)
Subjective:  Patient ID: Robert Greene, male , DOB: 1960/05/30 , age 60 y.o. 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  Overall he is stable as documented in the symptom score below.  He continues to smoke.  In fact he smoking half pack cigarettes a day.  In terms of his vaccination he has had Prevnar but not Pneumovax.  He is willing to have Pneumovax.  He says he has had his flu shot but the records do not seem updated for this.  He again refused Covid vaccine.  His symptom score and PFTs are stable.  He did not have high-resolution CT chest but instead he had low-dose CT chest for lung cancer screening.  But at least looking at symptom score walk test and desaturation test everything is stable in terms of his ILD.  He complains of some dyspnea on exertion as documented below.  We offered him pulmonary rehabilitation but he refused.  He says he is working out enough in the yard.    Results for Robert Greene, Robert Greene (MRN [id number]) as of 06/26/2019 11:49  Ref. Range 09/26/2017 10:17 01/04/2018 11:05 03/12/2018 09:01 06/16/2019 08:51 12/26/19  FVC-Pre Latest Units: L 4.32 3.95 3.69 4.14 4.24  FVC-%Pred-Pre Latest Units: % 91 83 77 88 90   Results for Robert Greene, Robert Greene (MRN [id number]) as of 06/26/2019 11:49  Ref. Range 09/26/2017 10:17 01/04/2018 11:05 03/12/2018 09:01 06/16/2019 08:51 01/01/2020   DLCO unc Latest Units: ml/min/mmHg 30.39 25.65 29.08 28.02 26.6  DLCO unc % pred Latest Units: % 97 81 105 103 97%    CT chest LDCT 12/08/19   IMPRESSION: 1. Lung-RADS 1, negative.  Continue annual screening with low-dose chest CT without contrast in 12 months. 2. Basilar predominant subpleural fibrosis, suboptimally characterized with low-dose technique. Nonspecific interstitial pneumonitis or usual interstitial pneumonitis could have this appearance. Patient underwent lung biopsy 12/06/2017. 3. Aortic atherosclerosis (ICD10-I70.0). Coronary artery calcification. 4.  Emphysema (ICD10-J43.9).     Electronically Signed   By: Lorin Picket M.D.   On: 12/08/2019 11:24   OV 10/22/2020      10/22/2020 -   Chief Complaint  Patient presents with   Follow-up    Pt states he has been doing okay since last visit and states his breathing is about the same.     HPI Robert Greene 60 y.o. -with a history of smoking related-ILD and rheumatoid arthritis here for follow-up.  Patient reports that since his last visit he has been doing about the same.  His breathing has been stable at rest.  However he continues to have dyspnea on exertion especially after working in his yard.  He reports difficulty walking on the stairs but he thinks this is more related to joint stiffness and pain from his rheumatoid arthritis.  He has cut down on his smoking to about half a pack of cigarettes per day.  He wants to continue to quit cold [place] and is not interested in any smoking cessation resources.  States he received the pneumonia vaccine last year and  he is interested in getting the flu vaccine today.   SYMPTOM SCALE - ILD 03/12/2018  06/26/2019  01/01/2020  10/22/2020  O2 use ra  ra   Shortness of Breath 0 -> 5 scale with 5 being worst (score 6 If unable to do)     At rest 1 1 1 1   Simple tasks - showers, clothes change, eating, shaving 0* 1 2 1   Household (dishes, doing bed, laundry) 1* 2 2 2   Shopping 0 1 1 1   Walking level at own pace 1 2 1  0  Walking up Stairs 2 3 3 3   Total (40 - 48) Dyspnea Score 8 10 10 9   How bad is your cough? 1 1 1 1   How bad is your fatigue 1 1 1 1    nausea  1 2 1   vomit  0 0 0  diarrhea  0 0 0  anxiety  1 0 1  depression  0 0 2     Simple office walk 185 feet x  3 laps goal with forehead probe 09/26/2017  11/22/2017  03/12/2018  /yf  06/26/2019  10/22/2020  O2 used Room air Room air Room air  Room air Room air  Number laps completed 3 3 3  x 185 feet  3 x 185 3  Comments about pace Normal to mod pace  normal  nl nl  Resting Pulse Ox/HR 99% and 69/min 100% and 73/min 99% and 68/min 96% annd 91/min 100% and 69 100% and 63  Final Pulse Ox/HR 99% and 89/min 100% and 97/min 97% and 88/min 96% and 101 100% and 91/min 100% and 102  Desaturated </= 88% no no no  no no  Desaturated <= 3% points no no no  no Yes  Got Tachycardic >/= 90/min No but almost yes no  yes Yes  Symptoms at end of test no x none  Mild dyspnea Mild SOB  Miscellaneous comments x x x   x    CT Chest data  No results found.  PFT  PFT Results Latest Ref Rng & Units 07/30/2020 12/26/2019 06/16/2019 03/12/2018 01/04/2018 09/26/2017  FVC-Pre L 4.10 4.24 4.14 3.69 3.95 4.32  FVC-Predicted Pre % 88 90 88 77 83 91  Pre FEV1/FVC % % 73 72 71 72 72 70  FEV1-Pre L 2.99 3.07 2.96 2.64 2.84 3.03  FEV1-Predicted Pre % 85 86 83 73 78 84  DLCO uncorrected ml/min/mmHg 25.83 26.60 28.02 29.08 25.65 30.39  DLCO UNC% % 95 97 103 105 81 97  DLCO corrected ml/min/mmHg 25.83 26.60 28.02 - - -  DLCO COR %Predicted % 95 97 103 - - -  DLVA Predicted % 103 99 113 119 97 106       has a past medical history of Anxiety, Arthritis, Coronary artery calcification seen on CAT scan (04/08/2018), Dyspnea, HTN (hypertension) (04/08/2018), Hypercholesteremia, Hypercholesteremia, Hypertension, and Pulmonary fibrosis ([place]).   reports that he has been smoking cigarettes. He has a 60.00 pack-year smoking history. He has never used smokeless tobacco.  Past Surgical History:  Procedure Laterality Date   JOINT REPLACEMENT     x2 scopes not replacement   LUNG BIOPSY Right 12/06/2017   Procedure: LUNG  BIOPSY;  Surgeon: Melrose Nakayama, MD;  Location: [facility];  Service: Thoracic;  Laterality: Right;   VIDEO ASSISTED THORACOSCOPY Right 12/06/2017   Procedure: VIDEO ASSISTED THORACOSCOPY;  Surgeon: Melrose Nakayama, MD;  Location: [facility];  Service: Thoracic;  Laterality: Right;    Allergies  Allergen Reactions   Bee Venom Anaphylaxis, Hives and Rash   Spider Antivenin [Black Widow Spider Antivenin (L.Mactans)] Anaphylaxis, Hives and Rash    Immunization History  Administered Date(s) Administered   Influenza,inj,Quad PF,6+ Mos 09/26/2017, 09/01/2018   Pneumococcal Conjugate-13 11/22/2017   Pneumococcal Polysaccharide-23 01/01/2020   Zoster, Live 06/24/2019    No family history on file.   Current Outpatient Medications:    amLODipine (NORVASC) 5 MG tablet, TAKE 1 TABLET BY MOUTH EVERY DAY, Disp: 90 tablet, Rfl: 0   atorvastatin (LIPITOR) 80 MG tablet, Take 80 mg by mouth daily., Disp: , Rfl: 1   diphenhydrAMINE (BENADRYL) 25 MG tablet, Take 25 mg by mouth daily as needed for itching. , Disp: , Rfl:    EPINEPHrine 0.3 mg/0.3 mL IJ SOAJ injection, Use once.  If ineffective, use 2nd dose., Disp: 2 Device, Rfl: 0   etanercept (ENBREL) 50 MG/ML injection, Inject 50 mg into the skin once a week., Disp: , Rfl:    hydrochlorothiazide (HYDRODIURIL) 12.5 MG tablet, TAKE 1 TABLET BY MOUTH EVERY DAY, Disp: 90 tablet, Rfl: 1   hydroxychloroquine (PLAQUENIL) 200 MG tablet, Take 200 mg by mouth daily. , Disp: , Rfl: 2   ketorolac (TORADOL) 10 MG tablet, Take 1 tablet (10 mg total) by mouth every 6 (six) hours as needed., Disp: 20 tablet, Rfl: 0   losartan (COZAAR) 100 MG tablet, TAKE 1 TABLET BY MOUTH EVERY DAY, Disp: 90 tablet, Rfl: 1   Omega-3 Fatty Acids (FISH OIL PO), Take 2 capsules by mouth 2 (two) times daily. , Disp: , Rfl:    ondansetron (ZOFRAN ODT) 4 MG disintegrating tablet, Take 1 tablet (4 mg total) by mouth every 8 (eight) hours as needed for nausea or vomiting., Disp: 21  tablet, Rfl: 0      Objective:   Vitals:   10/22/20 1428  BP: 132/76  Pulse: 63  Temp: 98.3 F (36.8 C)  TempSrc: Oral  SpO2: 100%  Weight: 212 lb 6.4 oz (96.3 kg)    Estimated body mass index is 30.48 kg/m as calculated from the following:   Height as of 01/01/20: 5\' 10"  (1.778 m).   Weight as of this encounter: 212 lb 6.4 oz (96.3 kg).  @WEIGHTCHANGE @  [employer]   10/22/20 1428  Weight: 212 lb 6.4 oz (96.3 kg)     Physical Exam General: Pleasant, well-appearing elderly male.  No distress.  Neuro: Alert and Oriented x 3. GCS 15. Speech normal Psych: Pleasant.  Normal mood and affect. Resp: Lungs clear to auscultation bilaterally.  Normal effort.  No wheezing or rales. CVS: Normal heart sounds.  Regular rate and rhythm.  No M/R/G. Ext: Stigmata of Connective Tissue Disease -no HEENT: Normal upper airway. PEERL +. No post nasal drip.  Moist mucous membranes.     Assessment:       ICD-10-CM   1. Interstitial pulmonary disease (HCC)  J84.9 Pulmonary function test    CT Chest High Resolution    2. Need for immunization against influenza  Z23 Flu Vaccine QUAD 73mo+IM (Fluarix, Fluzone & Alfiuria Quad PF)     60 year old patient with smoking-related interstitial lung disease here for follow-up. Patient reports that his symptoms have been stable but continues to have progressive dyspnea on exertion.  PFT in July 2022 showed stable lung function.  2-minute walk test in clinic today show tachycardia but stable O2 sats at 100% before and after.   Patient continues to smoke but has not cut down to half  a pack of cigarettes per day.  He is not interested in Chantix and would like to continue to quit on his own.  Plan to get a follow-up PFT and high-resolution chest CT in 3 months to further evaluate lung function.  Interested in getting a flu shot today.  Does not want the COVID-vaccine.  Rheumatoid arthritis Reports he was diagnosed about 4 years ago.  He was initially  on Humira but was switched to Enbrel about 2 years ago.  States this has been helping with his symptoms but he continues to have joint stiffness and occasional pain limiting his ability to walk up the stairs.  Patient is also on Plaquenil 200 mg daily    Plan:   Plan: Flu shot today High-res CT chest December PFT in December  Clinic follow in December after studies Patient Instructions  Continue to work on quitting cigarette. Great job cutting down to half a pack/day.  Plan: Flu shot today High-res CT chest December PFT in December  Clinic follow in December after studies   SIGNATURE    Dr. Brand Males, M.D., F.C.C.P,  Pulmonary and Critical Care Medicine Staff Physician, [place] Director - Interstitial Lung Disease  Program  [facility] [place], [place], [address]  Pager: [phone], If no answer or between  15:00h - 7:00h: call [phone] Telephone: [phone]  3:00 PM 10/22/2020

## 2020-10-22 NOTE — Patient Instructions (Signed)
Continue to work on quitting cigarette. Great job cutting down to half a pack/day.  Plan: Flu shot today High-res CT chest December PFT in December  Clinic follow in December after studies

## 2020-10-25 DIAGNOSIS — Z91030 Bee allergy status: Secondary | ICD-10-CM | POA: Diagnosis not present

## 2020-10-25 DIAGNOSIS — M25561 Pain in right knee: Secondary | ICD-10-CM | POA: Diagnosis not present

## 2020-10-25 DIAGNOSIS — G4700 Insomnia, unspecified: Secondary | ICD-10-CM | POA: Diagnosis not present

## 2020-11-01 ENCOUNTER — Encounter: Payer: Self-pay | Admitting: Dermatology

## 2020-11-01 NOTE — Progress Notes (Signed)
   Follow-Up Visit   Subjective  Robert Greene is a 60 y.o. male who presents for the following: Skin Problem (Rash  x 3 months- on left and right leg- no itch just irritating Tx - cortisone cream ).  Rash on legs Location:  Duration:  Quality:  Associated Signs/Symptoms: Modifying Factors:  Severity:  Timing: Context:   Objective  Well appearing patient in no apparent distress; mood and affect are within normal limits. Left Lower Leg - Anterior, Right Lower Leg - Anterior See photograph: Patchy macular hyperpigmentation with some nonblanching telangiectasia.  This would be compatible with pigmented purpuric eruption or changes of chronic stasis dermatitis.  Minimal pedal edema.     Left Forearm - Anterior Tan textured 6 mm flattopped papule  Right Axilla 2 mm pedunculated papule    All sun exposed areas plus back examined.   Assessment & Plan    Rash and other nonspecific skin eruption Left Lower Leg - Anterior; Right Lower Leg - Anterior  We discussed the possibility that biopsy would help establish a diagnosis.  However this is unlikely to result in effective therapy or improve patient's overall wellness and could cause a nonhealing leg wound.  Biopsy is deferred.  Seborrheic keratosis Left Forearm - Anterior  Leave if stable  Skin tag Right Axilla  No intervention necessary      I, Lavonna Monarch, MD, have reviewed all documentation for this visit.  The documentation on 11/01/20 for the exam, diagnosis, procedures, and orders are all accurate and complete.

## 2020-11-15 DIAGNOSIS — I7300 Raynaud's syndrome without gangrene: Secondary | ICD-10-CM | POA: Diagnosis not present

## 2020-11-15 DIAGNOSIS — Z7952 Long term (current) use of systemic steroids: Secondary | ICD-10-CM | POA: Diagnosis not present

## 2020-11-15 DIAGNOSIS — M0609 Rheumatoid arthritis without rheumatoid factor, multiple sites: Secondary | ICD-10-CM | POA: Diagnosis not present

## 2020-11-15 DIAGNOSIS — J849 Interstitial pulmonary disease, unspecified: Secondary | ICD-10-CM | POA: Diagnosis not present

## 2020-11-15 DIAGNOSIS — R7989 Other specified abnormal findings of blood chemistry: Secondary | ICD-10-CM | POA: Diagnosis not present

## 2020-11-15 DIAGNOSIS — M25461 Effusion, right knee: Secondary | ICD-10-CM | POA: Diagnosis not present

## 2020-11-15 DIAGNOSIS — M353 Polymyalgia rheumatica: Secondary | ICD-10-CM | POA: Diagnosis not present

## 2020-12-09 ENCOUNTER — Other Ambulatory Visit: Payer: Self-pay

## 2020-12-09 ENCOUNTER — Ambulatory Visit (INDEPENDENT_AMBULATORY_CARE_PROVIDER_SITE_OTHER)
Admission: RE | Admit: 2020-12-09 | Discharge: 2020-12-09 | Disposition: A | Discharge status: Home / Self Care | Payer: Medicare Other | Source: Ambulatory Visit | Attending: Internal Medicine | Admitting: Internal Medicine

## 2020-12-09 DIAGNOSIS — J432 Centrilobular emphysema: Secondary | ICD-10-CM | POA: Diagnosis not present

## 2020-12-09 DIAGNOSIS — J849 Interstitial pulmonary disease, unspecified: Secondary | ICD-10-CM | POA: Diagnosis not present

## 2020-12-09 DIAGNOSIS — J479 Bronchiectasis, uncomplicated: Secondary | ICD-10-CM | POA: Diagnosis not present

## 2020-12-09 DIAGNOSIS — I2510 Atherosclerotic heart disease of native coronary artery without angina pectoris: Secondary | ICD-10-CM | POA: Diagnosis not present

## 2020-12-10 ENCOUNTER — Telehealth: Payer: Self-pay | Admitting: Internal Medicine

## 2020-12-10 ENCOUNTER — Ambulatory Visit (INDEPENDENT_AMBULATORY_CARE_PROVIDER_SITE_OTHER): Payer: Medicare Other | Admitting: Internal Medicine

## 2020-12-10 DIAGNOSIS — K7460 Unspecified cirrhosis of liver: Secondary | ICD-10-CM

## 2020-12-10 DIAGNOSIS — R160 Hepatomegaly, not elsewhere classified: Secondary | ICD-10-CM | POA: Diagnosis not present

## 2020-12-10 DIAGNOSIS — F1010 Alcohol abuse, uncomplicated: Secondary | ICD-10-CM | POA: Diagnosis not present

## 2020-12-10 DIAGNOSIS — F4321 Adjustment disorder with depressed mood: Secondary | ICD-10-CM

## 2020-12-10 NOTE — Progress Notes (Signed)
OV 12/10/2020  Subjective:  Patient ID: Robert Greene, male , DOB: 12-10-1960 , age 60 y.o. , MRN: [id number] , ADDRESS: [place] [id number] PCP Collene Leyden, MD Patient Care Team: Collene Leyden, MD as PCP - General (Family Medicine) Lavonna Monarch, MD as Consulting Physician (Dermatology)  This Provider for this visit: Treatment Team:  Attending Provider: Brand Males, MD  Type of visit: Telephone/Video Circumstance: COVID-19 national emergency Identification of patient Robert Greene with August 28, 1960 and MRN [id number] - 2 person identifier Risks: Risks, benefits, limitations of telephone visit explained. Patient understood and verbalized agreement to proceed Anyone else on call: just patient Patient location: his home - [phone] This provider location: [address]; [place]; [place] [address]. [facility]. [phone]    12/10/2020 -  call to give CT abd results.  He has interstitial lung disease.  He has follow-up appointment pending with me in mid December 2022.  He had routine ILD CT scan and this shows ILD stable but the report also shows new onset of cirrhosis with liver mass.  I therefore set up this telephone appointment to call him and give him the results.  He said his wife passed away at age 75 from a heart attack in the spring 2022.  Since then he has been drinking a lot of alcohol.  He saw his rheumatologist Dr. Amil Amen recently and was advised to quit drinking because his liver enzymes were high.  I did express to him that there is findings of cirrhosis.  He told me that a few years ago he had right upper quadrant ultrasound it was normal.  Expressed to him that he also has a liver mass.  Told him this requires an MRI.  He recollects having had an MRI.  He says there is no metal in his body.  He feels that there is no claustrophobia and he can safely have an MRI.  Told him that I would set up these test but then  refer him to GI.  He is agreeable with this plan.   HPI Robert Greene 60 y.o. -    CT Chest data  CT Chest High Resolution  Result Date: 12/09/2020 CLINICAL DATA:  60 year old male with history of pulmonary fibrosis. Shortness of breath on exertion. Smoker. EXAM: CT CHEST WITHOUT CONTRAST TECHNIQUE: Multidetector CT imaging of the chest was performed following the standard protocol without intravenous contrast. High resolution imaging of the lungs, as well as inspiratory and expiratory imaging, was performed. COMPARISON:  Chest CT 12/08/2019. FINDINGS: Cardiovascular: Heart size is normal. There is no significant pericardial fluid, thickening or pericardial calcification. There is aortic atherosclerosis, as well as atherosclerosis of the great vessels of the mediastinum and the coronary arteries, including calcified atherosclerotic plaque in the left main, left anterior descending and left circumflex coronary arteries. Mediastinum/Nodes: No pathologically enlarged mediastinal or hilar lymph nodes. Esophagus is unremarkable in appearance. No axillary lymphadenopathy. Lungs/Pleura: High-resolution images again demonstrate some patchy areas of ground-glass attenuation, septal thickening, subpleural reticulation, traction bronchiectasis and peripheral bronchiolectasis. Findings are mildly progressive compared to the prior study. Findings are also asymmetric involving the right lung to a greater extent than the left (similar to the prior study). No frank honeycombing. Inspiratory and expiratory imaging is unremarkable. There is also mild diffuse bronchial wall thickening with mild centrilobular and paraseptal emphysema. No acute consolidative airspace disease. No pleural effusions. No definite suspicious appearing pulmonary nodules or  masses are noted. Upper Abdomen: Diffuse low attenuation throughout the visualized hepatic parenchyma, indicative of a background of hepatic steatosis. Liver has a slightly  nodular contour, suggesting underlying cirrhosis. There is an incompletely imaged mass-like area in the right upper quadrant of the abdomen, which appears contiguous with the undersurface of the right lobe of the liver in the region of segment 6 (axial image 177 of series 2), measuring at least 4.3 x 3.6 cm with ill-defined poorly delineated margins, concerning for potential neoplasm. Musculoskeletal: There are no aggressive appearing lytic or blastic lesions noted in the visualized portions of the skeleton. IMPRESSION: 1. The appearance of the lungs is compatible with interstitial lung disease, with a spectrum of findings categorized as probable usual interstitial pneumonia (UIP) per current ATS guidelines. 2. Mild diffuse bronchial wall thickening with mild centrilobular and paraseptal emphysema is also noted; imaging findings suggestive of concurrent COPD. 3. Aortic atherosclerosis, in addition to left main and 3 vessel coronary artery disease. Please note that although the presence of coronary artery calcium documents the presence of coronary artery disease, the severity of this disease and any potential stenosis cannot be assessed on this non-gated CT examination. Assessment for potential risk factor modification, dietary therapy or pharmacologic therapy may be warranted, if clinically indicated. 4. Hepatic steatosis, with morphologic changes in the liver concerning for underlying cirrhosis. Notably, there is also an incompletely imaged mass-like area in the right upper quadrant of the abdomen, potentially emanating from the liver. This has poorly defined margins and is concerning for neoplasm. Further evaluation with nonemergent abdominal MRI with and without IV gadolinium is strongly recommended in the near future to better evaluate this finding. These results will be called to the ordering clinician or representative by the Radiologist Assistant, and communication documented in the PACS or [employer].  Aortic Atherosclerosis (ICD10-I70.0) and Emphysema (ICD10-J43.9). Electronically Signed   By: Vinnie Langton M.D.   On: 12/09/2020 19:21      PFT  PFT Results Latest Ref Rng & Units 07/30/2020 12/26/2019 06/16/2019 03/12/2018 01/04/2018 09/26/2017  FVC-Pre L 4.10 4.24 4.14 3.69 3.95 4.32  FVC-Predicted Pre % 88 90 88 77 83 91  Pre FEV1/FVC % % 73 72 71 72 72 70  FEV1-Pre L 2.99 3.07 2.96 2.64 2.84 3.03  FEV1-Predicted Pre % 85 86 83 73 78 84  DLCO uncorrected ml/min/mmHg 25.83 26.60 28.02 29.08 25.65 30.39  DLCO UNC% % 95 97 103 105 81 97  DLCO corrected ml/min/mmHg 25.83 26.60 28.02 - - -  DLCO COR %Predicted % 95 97 103 - - -  DLVA Predicted % 103 99 113 119 97 106       has a past medical history of Anxiety, Arthritis, Coronary artery calcification seen on CAT scan (04/08/2018), Dyspnea, HTN (hypertension) (04/08/2018), Hypercholesteremia, Hypercholesteremia, Hypertension, and Pulmonary fibrosis ([place]).   reports that he has been smoking cigarettes. He has a 60.00 pack-year smoking history. He has never used smokeless tobacco.  Past Surgical History:  Procedure Laterality Date   JOINT REPLACEMENT     x2 scopes not replacement   LUNG BIOPSY Right 12/06/2017   Procedure: LUNG BIOPSY;  Surgeon: Melrose Nakayama, MD;  Location: [facility];  Service: Thoracic;  Laterality: Right;   VIDEO ASSISTED THORACOSCOPY Right 12/06/2017   Procedure: VIDEO ASSISTED THORACOSCOPY;  Surgeon: Melrose Nakayama, MD;  Location: [facility];  Service: Thoracic;  Laterality: Right;    Allergies  Allergen Reactions   Bee Venom Anaphylaxis, Hives and Rash  Spider Antivenin [Black Widow Spider Antivenin (L.Mactans)] Anaphylaxis, Hives and Rash    Immunization History  Administered Date(s) Administered   Influenza,inj,Quad PF,6+ Mos 09/26/2017, 09/01/2018, 10/22/2020   Pneumococcal Conjugate-13 11/22/2017   Pneumococcal Polysaccharide-23 01/01/2020   Zoster, Live 06/24/2019    No family history on  file.   Current Outpatient Medications:    amLODipine (NORVASC) 5 MG tablet, TAKE 1 TABLET BY MOUTH EVERY DAY, Disp: 90 tablet, Rfl: 0   atorvastatin (LIPITOR) 80 MG tablet, Take 80 mg by mouth daily., Disp: , Rfl: 1   diphenhydrAMINE (BENADRYL) 25 MG tablet, Take 25 mg by mouth daily as needed for itching. , Disp: , Rfl:    EPINEPHrine 0.3 mg/0.3 mL IJ SOAJ injection, Use once.  If ineffective, use 2nd dose., Disp: 2 Device, Rfl: 0   etanercept (ENBREL) 50 MG/ML injection, Inject 50 mg into the skin once a week., Disp: , Rfl:    hydrochlorothiazide (HYDRODIURIL) 12.5 MG tablet, TAKE 1 TABLET BY MOUTH EVERY DAY, Disp: 90 tablet, Rfl: 1   hydroxychloroquine (PLAQUENIL) 200 MG tablet, Take 200 mg by mouth daily. , Disp: , Rfl: 2   ketorolac (TORADOL) 10 MG tablet, Take 1 tablet (10 mg total) by mouth every 6 (six) hours as needed., Disp: 20 tablet, Rfl: 0   losartan (COZAAR) 100 MG tablet, TAKE 1 TABLET BY MOUTH EVERY DAY, Disp: 90 tablet, Rfl: 1   Omega-3 Fatty Acids (FISH OIL PO), Take 2 capsules by mouth 2 (two) times daily. , Disp: , Rfl:    ondansetron (ZOFRAN ODT) 4 MG disintegrating tablet, Take 1 tablet (4 mg total) by mouth every 8 (eight) hours as needed for nausea or vomiting., Disp: 21 tablet, Rfl: 0      Objective:   There were no vitals filed for this visit.  Estimated body mass index is 30.48 kg/m as calculated from the following:   Height as of 01/01/20: 5\' 10"  (1.778 m).   Weight as of 10/22/20: 212 lb 6.4 oz (96.3 kg).  @WEIGHTCHANGE @  There were no vitals filed for this visit.   Physical Exam Sounded sad with a hoarse voice on the phone     Assessment:       ICD-10-CM   1. Liver mass  R16.0     2. Hepatic cirrhosis, unspecified hepatic cirrhosis type, unspecified whether ascites present ([place])  K74.60     3. Alcohol abuse  F10.10     4. Grief reaction  F43.21          Plan:     Patient Instructions     ICD-10-CM   1. Liver mass  R16.0     2.  Hepatic cirrhosis, unspecified hepatic cirrhosis type, unspecified whether ascites present ([place])  K74.60     3. Alcohol abuse  F10.10     4. Grief reaction  F43.21      ILD itself is stable But you appear to have cirrhosis of the liver and liver mass that is new Sorry to hear about your wife passing away in the spring 2022 from a heart attack at age 36 The grief is understandable and appreciate you being vulnerable and expressing higher intake of alcohol  Plan   Do blood work CBC, chemistry, liver function test, PT, PTT alpha-fetoprotein, alpha-1 antitrypsin phenotype, hepatitis virus panel and QuantiFERON gold  Do MRI of the abdomen with and without IV gadolinium  Refer to GI  Encourage you to quit drinking alcohol  Keep pulmonary clinic follow-up for pulmonary  issues in mid December 2020 as scheduled    (Telephone visit - Level 02 visit: Estb 11-20 for this visit type which was visit type: telephone visit in total care time and counseling or/and coordination of care by this undersigned MD - Dr Brand Males. This includes one or more of the following for care delivered on 12/10/2020 same day: pre-charting, chart review, note writing, documentation discussion of test results, diagnostic or treatment recommendations, prognosis, risks and benefits of management options, instructions, education, compliance or risk-factor reduction. It excludes time spent by the [facility] or office staff in the care of the patient. Actual time was 11 min. E&M code is [id number])   SIGNATURE    Dr. Brand Males, M.D., F.C.C.P,  Pulmonary and Critical Care Medicine Staff Physician, [place] Director - Interstitial Lung Disease  Program  P[facility] at [place], [place], [address]  Pager: [phone], If no answer or between  15:00h - 7:00h: call [phone] Telephone: [phone]  10:52 AM 12/10/2020

## 2020-12-10 NOTE — Patient Instructions (Addendum)
ICD-10-CM   1. Liver mass  R16.0     2. Hepatic cirrhosis, unspecified hepatic cirrhosis type, unspecified whether ascites present ([place])  K74.60     3. Alcohol abuse  F10.10     4. Grief reaction  F43.21      ILD itself is stable But you appear to have cirrhosis of the liver and liver mass that is new Sorry to hear about your wife passing away in the spring 2022 from a heart attack at age 60 The grief is understandable and appreciate you being vulnerable and expressing higher intake of alcohol  Plan   Do blood work CBC, chemistry, liver function test, PT, PTT alpha-fetoprotein, alpha-1 antitrypsin phenotype, hepatitis virus panel and QuantiFERON gold  Do MRI of the abdomen with and without IV gadolinium  Refer to GI  Encourage you to quit drinking alcohol  Keep pulmonary clinic follow-up for pulmonary issues in mid December 2020 as scheduled

## 2020-12-10 NOTE — Telephone Encounter (Signed)
Received call report from Opal Sidles with [facility] radi[facility] on CT impression 4.  Report is available via mychart.   MR, please advise. Thanks

## 2020-12-10 NOTE — Telephone Encounter (Signed)
  I need to disclose these MRI results 12/10/2020 to him - going to call him know [phone] Narrative & Impression  CLINICAL DATA:  60 year old male with history of pulmonary fibrosis. Shortness of breath on exertion. Smoker.   EXAM: CT CHEST WITHOUT CONTRAST   TECHNIQUE: Multidetector CT imaging of the chest was performed following the standard protocol without intravenous contrast. High resolution imaging of the lungs, as well as inspiratory and expiratory imaging, was performed.   COMPARISON:  Chest CT 12/08/2019.   FINDINGS: Cardiovascular: Heart size is normal. There is no significant pericardial fluid, thickening or pericardial calcification. There is aortic atherosclerosis, as well as atherosclerosis of the great vessels of the mediastinum and the coronary arteries, including calcified atherosclerotic plaque in the left main, left anterior descending and left circumflex coronary arteries.   Mediastinum/Nodes: No pathologically enlarged mediastinal or hilar lymph nodes. Esophagus is unremarkable in appearance. No axillary lymphadenopathy.   Lungs/Pleura: High-resolution images again demonstrate some patchy areas of ground-glass attenuation, septal thickening, subpleural reticulation, traction bronchiectasis and peripheral bronchiolectasis. Findings are mildly progressive compared to the prior study. Findings are also asymmetric involving the right lung to a greater extent than the left (similar to the prior study). No frank honeycombing. Inspiratory and expiratory imaging is unremarkable. There is also mild diffuse bronchial wall thickening with mild centrilobular and paraseptal emphysema. No acute consolidative airspace disease. No pleural effusions. No definite suspicious appearing pulmonary nodules or masses are noted.   Upper Abdomen: Diffuse low attenuation throughout the visualized hepatic parenchyma, indicative of a background of hepatic steatosis. Liver  has a slightly nodular contour, suggesting underlying cirrhosis. There is an incompletely imaged mass-like area in the right upper quadrant of the abdomen, which appears contiguous with the undersurface of the right lobe of the liver in the region of segment 6 (axial image 177 of series 2), measuring at least 4.3 x 3.6 cm with ill-defined poorly delineated margins, concerning for potential neoplasm.   Musculoskeletal: There are no aggressive appearing lytic or blastic lesions noted in the visualized portions of the skeleton.   IMPRESSION: 1. The appearance of the lungs is compatible with interstitial lung disease, with a spectrum of findings categorized as probable usual interstitial pneumonia (UIP) per current ATS guidelines. 2. Mild diffuse bronchial wall thickening with mild centrilobular and paraseptal emphysema is also noted; imaging findings suggestive of concurrent COPD. 3. Aortic atherosclerosis, in addition to left main and 3 vessel coronary artery disease. Please note that although the presence of coronary artery calcium documents the presence of coronary artery disease, the severity of this disease and any potential stenosis cannot be assessed on this non-gated CT examination. Assessment for potential risk factor modification, dietary therapy or pharmacologic therapy may be warranted, if clinically indicated. 4. Hepatic steatosis, with morphologic changes in the liver concerning for underlying cirrhosis. Notably, there is also an incompletely imaged mass-like area in the right upper quadrant of the abdomen, potentially emanating from the liver. This has poorly defined margins and is concerning for neoplasm. Further evaluation with nonemergent abdominal MRI with and without IV gadolinium is strongly recommended in the near future to better evaluate this finding.   These results will be called to the ordering clinician or representative by the Radiologist Assistant, and  communication documented in the PACS or [employer].   Aortic Atherosclerosis (ICD10-I70.0) and Emphysema (ICD10-J43.9).     Electronically Signed   By: Vinnie Langton M.D.   On: 12/09/2020 19:21

## 2020-12-13 ENCOUNTER — Other Ambulatory Visit (INDEPENDENT_AMBULATORY_CARE_PROVIDER_SITE_OTHER): Payer: Medicare Other

## 2020-12-13 DIAGNOSIS — R160 Hepatomegaly, not elsewhere classified: Secondary | ICD-10-CM

## 2020-12-13 DIAGNOSIS — K7460 Unspecified cirrhosis of liver: Secondary | ICD-10-CM | POA: Diagnosis not present

## 2020-12-13 LAB — CBC WITH DIFFERENTIAL/PLATELET
Basophils Absolute: 0.1 10*3/uL (ref 0.0–0.1)
Basophils Relative: 1.2 % (ref 0.0–3.0)
Eosinophils Absolute: 0.2 10*3/uL (ref 0.0–0.7)
Eosinophils Relative: 3.6 % (ref 0.0–5.0)
HCT: 36.6 % — ABNORMAL LOW (ref 39.0–52.0)
Hemoglobin: 12 g/dL — ABNORMAL LOW (ref 13.0–17.0)
Lymphocytes Relative: 28.8 % (ref 12.0–46.0)
Lymphs Abs: 1.6 10*3/uL (ref 0.7–4.0)
MCHC: 32.7 g/dL (ref 30.0–36.0)
MCV: 83 fl (ref 78.0–100.0)
Monocytes Absolute: 0.7 10*3/uL (ref 0.1–1.0)
Monocytes Relative: 13.4 % — ABNORMAL HIGH (ref 3.0–12.0)
Neutro Abs: 2.9 10*3/uL (ref 1.4–7.7)
Neutrophils Relative %: 53 % (ref 43.0–77.0)
Platelets: 77 10*3/uL — ABNORMAL LOW (ref 150.0–400.0)
RBC: 4.41 Mil/uL (ref 4.22–5.81)
RDW: 18 % — ABNORMAL HIGH (ref 11.5–15.5)
WBC: 5.5 10*3/uL (ref 4.0–10.5)

## 2020-12-13 LAB — BASIC METABOLIC PANEL
BUN: 10 mg/dL (ref 6–23)
CO2: 25 mEq/L (ref 19–32)
Calcium: 9.4 mg/dL (ref 8.4–10.5)
Chloride: 105 mEq/L (ref 96–112)
Creatinine, Ser: 0.7 mg/dL (ref 0.40–1.50)
GFR: 100.07 mL/min (ref >60.00)
Glucose, Bld: 122 mg/dL — ABNORMAL HIGH (ref 70–99)
Potassium: 3.8 mEq/L (ref 3.5–5.1)
Sodium: 137 mEq/L (ref 135–145)

## 2020-12-13 LAB — HEPATIC FUNCTION PANEL
ALT: 60 U/L — ABNORMAL HIGH (ref 0–53)
AST: 139 U/L — ABNORMAL HIGH (ref 0–37)
Albumin: 3.8 g/dL (ref 3.5–5.2)
Alkaline Phosphatase: 112 U/L (ref 39–117)
Bilirubin, Direct: 0.4 mg/dL — ABNORMAL HIGH (ref 0.0–0.3)
Total Bilirubin: 1.1 mg/dL (ref 0.2–1.2)
Total Protein: 7.9 g/dL (ref 6.0–8.3)

## 2020-12-13 LAB — PROTIME-INR
INR: 1.2 ratio — ABNORMAL HIGH (ref 0.8–1.0)
Prothrombin Time: 12.8 s (ref 9.6–13.1)

## 2020-12-13 LAB — APTT: aPTT: 33.7 s — ABNORMAL HIGH (ref 23.4–32.7)

## 2020-12-15 LAB — HAV, HBV PANEL
Hep B Core Total Ab: NEGATIVE
Hep B Surface Ab, Qual: NONREACTIVE
Hepatitis B Surface Ag: NEGATIVE
hep A Total Ab: POSITIVE — AB

## 2020-12-15 LAB — QUANTIFERON-TB GOLD PLUS
Mitogen-NIL: >10 IU/mL
NIL: 0.03 IU/mL
QuantiFERON-TB Gold Plus: NEGATIVE
TB1-NIL: 0.01 IU/mL
TB2-NIL: 0.01 IU/mL

## 2020-12-15 LAB — HEPATITIS A ANTIBODY, IGM: Hep A IgM: NEGATIVE

## 2020-12-19 NOTE — Progress Notes (Signed)
Slightly more anemic than past. Platelets lower Liver enzymes slightly higher -> stop etoh. Needs to see GI ASAP. I still do not see an appt.  Needs to complete MRI as well

## 2020-12-20 ENCOUNTER — Telehealth: Payer: Self-pay

## 2020-12-20 NOTE — Telephone Encounter (Signed)
Called and spoke to patient regarding results. Answered all questions and patient voiced understanding.

## 2020-12-20 NOTE — Telephone Encounter (Signed)
-----   Message from Brand Males, MD sent at 12/19/2020 11:05 PM EST ----- Slightly more anemic than past. Platelets lower Liver enzymes slightly higher -> stop etoh. Needs to see GI ASAP. I still do not see an appt.  Needs to complete MRI as well

## 2020-12-22 LAB — AFP TUMOR MARKER: AFP-Tumor Marker: 6.1 ng/mL — ABNORMAL HIGH (ref <6.1)

## 2020-12-22 LAB — ALPHA-1 ANTITRYPSIN PHENOTYPE: A-1 Antitrypsin, Ser: 230 mg/dL — ABNORMAL HIGH (ref 83–199)

## 2020-12-25 ENCOUNTER — Ambulatory Visit (HOSPITAL_COMMUNITY)
Admission: RE | Admit: 2020-12-25 | Discharge: 2020-12-25 | Disposition: A | Discharge status: Home / Self Care | Payer: Medicare Other | Source: Ambulatory Visit | Attending: Internal Medicine | Admitting: Internal Medicine

## 2020-12-25 DIAGNOSIS — R160 Hepatomegaly, not elsewhere classified: Secondary | ICD-10-CM | POA: Diagnosis not present

## 2020-12-25 DIAGNOSIS — K828 Other specified diseases of gallbladder: Secondary | ICD-10-CM | POA: Diagnosis not present

## 2020-12-25 DIAGNOSIS — K7460 Unspecified cirrhosis of liver: Secondary | ICD-10-CM | POA: Diagnosis not present

## 2020-12-25 DIAGNOSIS — K760 Fatty (change of) liver, not elsewhere classified: Secondary | ICD-10-CM | POA: Diagnosis not present

## 2020-12-25 DIAGNOSIS — I700 Atherosclerosis of aorta: Secondary | ICD-10-CM | POA: Diagnosis not present

## 2020-12-25 MED ORDER — GADOBUTROL 1 MMOL/ML IV SOLN
10.0000 mL | Freq: Once | INTRAVENOUS | Status: AC | PRN
  Administered 2020-12-25: 10 mL via INTRAVENOUS

## 2020-12-27 ENCOUNTER — Telehealth: Payer: Self-pay | Admitting: Internal Medicine

## 2020-12-27 DIAGNOSIS — K7460 Unspecified cirrhosis of liver: Secondary | ICD-10-CM

## 2020-12-28 NOTE — Telephone Encounter (Signed)
MR, please advise on the results.

## 2020-12-28 NOTE — Telephone Encounter (Signed)
Cirrhoss + No evidence of liver cancer  Plan  - non-urgent Gi referral -    IMPRESSION: 1. There is hepatic steatosis and morphologic changes in the liver indicative of underlying cirrhosis. However, there is no hepatic mass identified. The findings on the recent chest CT appear to correspond with a partially visualized gallbladder, and trace amount of perihepatic ascites localized in part around the gallbladder. 2. Aortic atherosclerosis.     Electronically Signed   By: Vinnie Langton M.D.   On: 12/27/2020 13:33

## 2020-12-29 NOTE — Telephone Encounter (Signed)
I spoke with the pt and notified him of results/resc per MR  He verbalized understanding  Referral to GI was placed

## 2020-12-31 ENCOUNTER — Other Ambulatory Visit: Payer: Self-pay | Admitting: Internal Medicine

## 2020-12-31 ENCOUNTER — Other Ambulatory Visit: Payer: Self-pay | Admitting: *Deleted

## 2020-12-31 DIAGNOSIS — F17210 Nicotine dependence, cigarettes, uncomplicated: Secondary | ICD-10-CM

## 2021-01-01 LAB — SARS CORONAVIRUS 2 (TAT 6-24 HRS): SARS Coronavirus 2: NEGATIVE

## 2021-01-02 ENCOUNTER — Other Ambulatory Visit: Payer: Self-pay | Admitting: Cardiology

## 2021-01-04 ENCOUNTER — Encounter: Payer: Self-pay | Admitting: Internal Medicine

## 2021-01-04 ENCOUNTER — Telehealth: Payer: Self-pay | Admitting: Internal Medicine

## 2021-01-04 ENCOUNTER — Ambulatory Visit (INDEPENDENT_AMBULATORY_CARE_PROVIDER_SITE_OTHER): Payer: Medicare Other | Admitting: Internal Medicine

## 2021-01-04 ENCOUNTER — Other Ambulatory Visit: Payer: Self-pay

## 2021-01-04 VITALS — BP 144/78 | HR 70 | Temp 98.3°F | Ht 69.25 in | Wt 214.0 lb

## 2021-01-04 DIAGNOSIS — F1010 Alcohol abuse, uncomplicated: Secondary | ICD-10-CM | POA: Diagnosis not present

## 2021-01-04 DIAGNOSIS — J849 Interstitial pulmonary disease, unspecified: Secondary | ICD-10-CM

## 2021-01-04 DIAGNOSIS — K7460 Unspecified cirrhosis of liver: Secondary | ICD-10-CM | POA: Diagnosis not present

## 2021-01-04 DIAGNOSIS — F17210 Nicotine dependence, cigarettes, uncomplicated: Secondary | ICD-10-CM | POA: Diagnosis not present

## 2021-01-04 LAB — PULMONARY FUNCTION TEST
DL/VA % pred: 114 %
DL/VA: 4.85 ml/min/mmHg/L
DLCO cor % pred: 102 %
DLCO cor: 27.73 ml/min/mmHg
DLCO unc % pred: 94 %
DLCO unc: 25.46 ml/min/mmHg
FEF 25-75 Pre: 2.37 L/sec
FEF2575-%Pred-Pre: 81 %
FEV1-%Pred-Pre: 85 %
FEV1-Pre: 3.01 L
FEV1FVC-%Pred-Pre: 99 %
FEV6-%Pred-Pre: 90 %
FEV6-Pre: 4 L
FEV6FVC-%Pred-Pre: 104 %
FVC-%Pred-Pre: 86 %
FVC-Pre: 4 L
Pre FEV1/FVC ratio: 75 %
Pre FEV6/FVC Ratio: 100 %

## 2021-01-04 LAB — HEPATIC FUNCTION PANEL
ALT: 54 U/L — ABNORMAL HIGH (ref 0–53)
AST: 116 U/L — ABNORMAL HIGH (ref 0–37)
Albumin: 3.8 g/dL (ref 3.5–5.2)
Alkaline Phosphatase: 112 U/L (ref 39–117)
Bilirubin, Direct: 0.7 mg/dL — ABNORMAL HIGH (ref 0.0–0.3)
Total Bilirubin: 1.9 mg/dL — ABNORMAL HIGH (ref 0.2–1.2)
Total Protein: 8 g/dL (ref 6.0–8.3)

## 2021-01-04 NOTE — Progress Notes (Signed)
Spiromety/DLCO performed today. ?

## 2021-01-04 NOTE — Addendum Note (Signed)
Addended by: Lorretta Harp on: 01/04/2021 11:18 AM   Modules accepted: Orders

## 2021-01-04 NOTE — Patient Instructions (Signed)
Spirometry/DLCO performed today. 

## 2021-01-04 NOTE — Patient Instructions (Addendum)
ILD (interstitial lung disease) ([place])  -Currently stable and this is due to smoking call smoking-related interstitial fibrosis [S RIF] but ongoung smoking and rheumatoid can make it worse   - supportive care is the plan  Plan -6 months do spirometry and dlco - return in 6 months   Moderate cigarette smoker (10-19 per day)  -Too bad this has been relapsed for   Plan --I hope you have quit smoking in the next few months  - consider Covid vaccine when you feel comfortable with data   Alcoholism and cirrhosis liver  Plan  - refer GI [facility], [place], Dr Collene Mares); whoever can see you first - check LFT 01/04/2021   Follow-up -6 months dodo spirometry and DLCO -Return to see Dr. [name] for a 30-minute face-to-face visit in [facility] clinic or any day in 6 months after the test

## 2021-01-04 NOTE — Progress Notes (Signed)
Subjective:  Patient ID: Robert Greene, male , DOB: [birth year] , age 60 y.o. , MRN: [id number] , ADDRESS: [place] [id number] PCP Collene Leyden, MD Patient Care Team: Collene Leyden, MD as PCP - General (Family Medicine) Lavonna Monarch, MD as Consulting Physician (Dermatology)  This Provider for this visit: Treatment Team:  Attending Provider: Brand Males, MD    Chief Complaint  Patient presents with   Follow-up    ILD, doing well       HPI Robert Greene 60 y.o. 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  Overall he is stable as documented in the symptom score below.  He continues to smoke.  In fact he smoking half pack cigarettes a day.  In terms of his vaccination he has had Prevnar but not Pneumovax.  He is willing to have Pneumovax.  He says he has had his flu shot but the records do not seem updated for this.  He again refused Covid vaccine.  His symptom score and PFTs are stable.  He did not have high-resolution CT chest but instead he had low-dose CT chest for lung cancer screening.  But at least looking at symptom score walk test and desaturation test everything is stable in terms of his ILD.  He complains of some dyspnea on exertion as documented below.  We offered him pulmonary rehabilitation but he refused.  He says he is working out enough in the yard.    Results for ARDITH, LEWMAN (MRN [id number]) as of 06/26/2019 11:49  Ref. Range 09/26/2017 10:17 01/04/2018 11:05 03/12/2018 09:01 06/16/2019 08:51 12/26/19  FVC-Pre Latest Units: L 4.32 3.95 3.69 4.14 4.24  FVC-%Pred-Pre Latest Units: % 91 83 77 88 90   Results for RAMESES, OU (MRN [id number]) as of 06/26/2019 11:49  Ref. Range 09/26/2017 10:17 01/04/2018 11:05 03/12/2018 09:01 06/16/2019 08:51 01/01/2020   DLCO unc Latest Units: ml/min/mmHg 30.39 25.65 29.08 28.02 26.6  DLCO unc % pred Latest Units: % 97 81 105 103 97%    CT chest LDCT 12/08/19   IMPRESSION: 1. Lung-RADS 1,  negative. Continue annual screening with low-dose chest CT without contrast in 12 months. 2. Basilar predominant subpleural fibrosis, suboptimally characterized with low-dose technique. Nonspecific interstitial pneumonitis or usual interstitial pneumonitis could have this appearance. Patient underwent lung biopsy 12/06/2017. 3. Aortic atherosclerosis (ICD10-I70.0). Coronary artery calcification. 4.  Emphysema (ICD10-J43.9).     Electronically Signed   By: Lorin Picket M.D.   On: 12/08/2019 11:24   OV 10/22/2020      10/22/2020 -   Chief Complaint  Patient presents with   Follow-up    Pt states he has been doing okay since last visit and states his breathing is about the same.     HPI Robert Greene 60 y.o. -with a history of smoking related-ILD and rheumatoid arthritis here for follow-up.  Patient reports that since his last visit he has been doing about the same.  His breathing has been stable at rest.  However he continues to have dyspnea on exertion especially after working in his yard.  He reports difficulty walking on the stairs but he thinks this is more related to joint stiffness and pain from his rheumatoid arthritis.  He has cut down on his smoking to about half a pack of cigarettes per day.  He wants to continue to quit cold [place] and is not interested in any smoking cessation resources.  States he received the pneumonia vaccine last  year and he is interested in getting the flu vaccine today.     CT Chest data   OV 12/10/2020  Subjective:  Patient ID: Robert Greene, male , DOB: 1960/05/23 , age 60 y.o. , MRN: [id number] , ADDRESS: [place] [id number] PCP Collene Leyden, MD Patient Care Team: Collene Leyden, MD as PCP - General (Family Medicine) Lavonna Monarch, MD as Consulting Physician (Dermatology)  This Provider for this visit: Treatment Team:  Attending Provider: Brand Males, MD  Type of visit: Telephone/Video Circumstance:  COVID-19 national emergency Identification of patient Robert Greene with 1960/09/14 and MRN [id number] - 2 person identifier Risks: Risks, benefits, limitations of telephone visit explained. Patient understood and verbalized agreement to proceed Anyone else on call: just patient Patient location: his home - [phone] This provider location: [address], [address]; [place]; [place] [address]. [facility]. [phone]    12/10/2020 -  call to give CT abd results.  He has interstitial lung disease.  He has follow-up appointment pending with me in mid December 2022.  He had routine ILD CT scan and this shows ILD stable but the report also shows new onset of cirrhosis with liver mass.  I therefore set up this telephone appointment to call him and give him the results.  He said his wife passed away at age 45 from a heart attack in the spring 2022.  Since then he has been drinking a lot of alcohol.  He saw his rheumatologist Dr. Amil Amen recently and was advised to quit drinking because his liver enzymes were high.  I did express to him that there is findings of cirrhosis.  He told me that a few years ago he had right upper quadrant ultrasound it was normal.  Expressed to him that he also has a liver mass.  Told him this requires an MRI.  He recollects having had an MRI.  He says there is no metal in his body.  He feels that there is no claustrophobia and he can safely have an MRI.  Told him that I would set up these test but then refer him to GI.  He is agreeable with this plan.   HPI Robert Greene 60 y.o. -    CT Chest data  CT Chest High Resolution  Result Date: 12/09/2020 CLINICAL DATA:  60 year old male with history of pulmonary fibrosis. Shortness of breath on exertion. Smoker. EXAM: CT CHEST WITHOUT CONTRAST TECHNIQUE: Multidetector CT imaging of the chest was performed following the standard protocol without intravenous contrast. High resolution imaging of the  lungs, as well as inspiratory and expiratory imaging, was performed. COMPARISON:  Chest CT 12/08/2019. FINDINGS: Cardiovascular: Heart size is normal. There is no significant pericardial fluid, thickening or pericardial calcification. There is aortic atherosclerosis, as well as atherosclerosis of the great vessels of the mediastinum and the coronary arteries, including calcified atherosclerotic plaque in the left main, left anterior descending and left circumflex coronary arteries. Mediastinum/Nodes: No pathologically enlarged mediastinal or hilar lymph nodes. Esophagus is unremarkable in appearance. No axillary lymphadenopathy. Lungs/Pleura: High-resolution images again demonstrate some patchy areas of ground-glass attenuation, septal thickening, subpleural reticulation, traction bronchiectasis and peripheral bronchiolectasis. Findings are mildly progressive compared to the prior study. Findings are also asymmetric involving the right lung to a greater extent than the left (similar to the prior study). No frank honeycombing. Inspiratory and expiratory imaging is unremarkable. There is also mild diffuse bronchial wall thickening with mild centrilobular and  paraseptal emphysema. No acute consolidative airspace disease. No pleural effusions. No definite suspicious appearing pulmonary nodules or masses are noted. Upper Abdomen: Diffuse low attenuation throughout the visualized hepatic parenchyma, indicative of a background of hepatic steatosis. Liver has a slightly nodular contour, suggesting underlying cirrhosis. There is an incompletely imaged mass-like area in the right upper quadrant of the abdomen, which appears contiguous with the undersurface of the right lobe of the liver in the region of segment 6 (axial image 177 of series 2), measuring at least 4.3 x 3.6 cm with ill-defined poorly delineated margins, concerning for potential neoplasm. Musculoskeletal: There are no aggressive appearing lytic or blastic  lesions noted in the visualized portions of the skeleton. IMPRESSION: 1. The appearance of the lungs is compatible with interstitial lung disease, with a spectrum of findings categorized as probable usual interstitial pneumonia (UIP) per current ATS guidelines. 2. Mild diffuse bronchial wall thickening with mild centrilobular and paraseptal emphysema is also noted; imaging findings suggestive of concurrent COPD. 3. Aortic atherosclerosis, in addition to left main and 3 vessel coronary artery disease. Please note that although the presence of coronary artery calcium documents the presence of coronary artery disease, the severity of this disease and any potential stenosis cannot be assessed on this non-gated CT examination. Assessment for potential risk factor modification, dietary therapy or pharmacologic therapy may be warranted, if clinically indicated. 4. Hepatic steatosis, with morphologic changes in the liver concerning for underlying cirrhosis. Notably, there is also an incompletely imaged mass-like area in the right upper quadrant of the abdomen, potentially emanating from the liver. This has poorly defined margins and is concerning for neoplasm. Further evaluation with nonemergent abdominal MRI with and without IV gadolinium is strongly recommended in the near future to better evaluate this finding. These results will be called to the ordering clinician or representative by the Radiologist Assistant, and communication documented in the PACS or [employer]. Aortic Atherosclerosis (ICD10-I70.0) and Emphysema (ICD10-J43.9). Electronically Signed   By: Vinnie Langton M.D.   On: 12/09/2020 19:21       OV 01/04/2021  Subjective:  Patient ID: Robert Greene, male , DOB: 08/07/1960 , age 65 y.o. , MRN: [id number] , ADDRESS: [place] [id number] PCP Collene Leyden, MD Patient Care Team: Collene Leyden, MD as PCP - General (Family Medicine) Lavonna Monarch, MD as Consulting Physician  (Dermatology)  This Provider for this visit: Treatment Team:  Attending Provider: Brand Males, MD    01/04/2021 -   Chief Complaint  Patient presents with   Follow-up    PFT performed today. HRCT and MRA recently performed. Pt states he has been doing okay since last visit and denies any complaints. States breathing is about the same.     HPI Robert Greene 60 y.o. -returns for follow-up for smoking-related interstitial fibrosis.  #ILD-smoking-related interstitial lung fibrosis [also rheumatoid arthritis on TNF alpha blockade]-continues to be stable.  Pulmonary function test might show slow decline over many years but most recently stable.  CT scan of the chest is also deemed stable  #Incidental finding: When we did the high-resolution CT the CT chest showed some liver masses.  So we got an MRI there are no liver masses but he definitely has cirrhosis.  He states he continues to drink alcohol because of grief reaction after his wife passed away.  He is also smoking now.  His liver function test is abnormal.  I referred him to GI but for some reason this has not  happened he does not know why.  We also got hepatitis virus panel.  He is worried about the findings on his liver.  He knows he needs to quit but is unable to    #smoking: This has not been relapse for 2 years.  Problem with a risk factor for progression of the ILD.   CT Chest data  No results found.   SYMPTOM SCALE - ILD 03/12/2018  06/26/2019  01/01/2020  10/22/2020  O2 use ra  ra   Shortness of Breath 0 -> 5 scale with 5 being worst (score 6 If unable to do)     At rest 1 1 1 1   Simple tasks - showers, clothes change, eating, shaving 0* 1 2 1   Household (dishes, doing bed, laundry) 1* 2 2 2   Shopping 0 1 1 1   Walking level at own pace 1 2 1  0  Walking up Stairs 2 3 3 3   Total (40 - 48) Dyspnea Score 8 10 10 9   How bad is your cough? 1 1 1 1   How bad is your fatigue 1 1 1 1   nausea  1 2 1   vomit  0 0 0   diarrhea  0 0 0  anxiety  1 0 1  depression  0 0 2     Simple office walk 185 feet x  3 laps goal with forehead probe 09/26/2017  11/22/2017  03/12/2018  /yf  06/26/2019  10/22/2020  O2 used Room air Room air Room air  Room air Room air  Number laps completed 3 3 3  x 185 feet  3 x 185 3  Comments about pace Normal to mod pace  normal  nl nl  Resting Pulse Ox/HR 99% and 69/min 100% and 73/min 99% and 68/min 96% annd 91/min 100% and 69 100% and 63  Final Pulse Ox/HR 99% and 89/min 100% and 97/min 97% and 88/min 96% and 101 100% and 91/min 100% and 102  Desaturated </= 88% no no no  no no  Desaturated <= 3% points no no no  no Yes  Got Tachycardic >/= 90/min No but almost yes no  yes Yes  Symptoms at end of test no x none  Mild dyspnea Mild SOB  Miscellaneous comments x x x   x    PFT  PFT Results Latest Ref Rng & Units 01/04/2021 07/30/2020 12/26/2019 06/16/2019 03/12/2018 01/04/2018 09/26/2017  FVC-Pre L 4.00 4.10 4.24 4.14 3.69 3.95 4.32  FVC-Predicted Pre % 86 88 90 88 77 83 91  Pre FEV1/FVC % % 75 73 72 71 72 72 70  FEV1-Pre L 3.01 2.99 3.07 2.96 2.64 2.84 3.03  FEV1-Predicted Pre % 85 85 86 83 73 78 84  DLCO uncorrected ml/min/mmHg 25.46 25.83 26.60 28.02 29.08 25.65 30.39  DLCO UNC% % 94 95 97 103 105 81 97  DLCO corrected ml/min/mmHg 27.73 25.83 26.60 28.02 - - -  DLCO COR %Predicted % 102 95 97 103 - - -  DLVA Predicted % 114 103 99 113 119 97 106       has a past medical history of Anxiety, Arthritis, Coronary artery calcification seen on CAT scan (04/08/2018), Dyspnea, HTN (hypertension) (04/08/2018), Hypercholesteremia, Hypercholesteremia, Hypertension, and Pulmonary fibrosis ([place]).   reports that he has been smoking cigarettes. He has a 60.00 pack-year smoking history. He has never used smokeless tobacco.  Past Surgical History:  Procedure Laterality Date   JOINT REPLACEMENT     x2 scopes not replacement  LUNG BIOPSY Right 12/06/2017   Procedure: LUNG BIOPSY;  Surgeon:  Melrose Nakayama, MD;  Location: [facility];  Service: Thoracic;  Laterality: Right;   VIDEO ASSISTED THORACOSCOPY Right 12/06/2017   Procedure: VIDEO ASSISTED THORACOSCOPY;  Surgeon: Melrose Nakayama, MD;  Location: [facility];  Service: Thoracic;  Laterality: Right;    Allergies  Allergen Reactions   Bee Venom Anaphylaxis, Hives and Rash   Spider Antivenin [Black Widow Spider Antivenin (L.Mactans)] Anaphylaxis, Hives and Rash    Immunization History  Administered Date(s) Administered   Influenza,inj,Quad PF,6+ Mos 09/26/2017, 09/01/2018, 10/22/2020   Pneumococcal Conjugate-13 11/22/2017   Pneumococcal Polysaccharide-23 01/01/2020   Zoster, Live 06/24/2019    No family history on file.   Current Outpatient Medications:    amLODipine (NORVASC) 5 MG tablet, TAKE 1 TABLET BY MOUTH EVERY DAY, Disp: 90 tablet, Rfl: 0   atorvastatin (LIPITOR) 80 MG tablet, Take 80 mg by mouth daily., Disp: , Rfl: 1   diphenhydrAMINE (BENADRYL) 25 MG tablet, Take 25 mg by mouth daily as needed for itching. , Disp: , Rfl:    EPINEPHrine 0.3 mg/0.3 mL IJ SOAJ injection, Use once.  If ineffective, use 2nd dose., Disp: 2 Device, Rfl: 0   etanercept (ENBREL) 50 MG/ML injection, Inject 50 mg into the skin once a week., Disp: , Rfl:    hydrochlorothiazide (HYDRODIURIL) 12.5 MG tablet, TAKE 1 TABLET BY MOUTH EVERY DAY, Disp: 90 tablet, Rfl: 1   hydroxychloroquine (PLAQUENIL) 200 MG tablet, Take 200 mg by mouth daily. , Disp: , Rfl: 2   ketorolac (TORADOL) 10 MG tablet, Take 1 tablet (10 mg total) by mouth every 6 (six) hours as needed., Disp: 20 tablet, Rfl: 0   losartan (COZAAR) 100 MG tablet, TAKE 1 TABLET BY MOUTH EVERY DAY, Disp: 90 tablet, Rfl: 1   Omega-3 Fatty Acids (FISH OIL PO), Take 2 capsules by mouth 2 (two) times daily. , Disp: , Rfl:    ondansetron (ZOFRAN ODT) 4 MG disintegrating tablet, Take 1 tablet (4 mg total) by mouth every 8 (eight) hours as needed for nausea or vomiting., Disp: 21 tablet,  Rfl: 0      Objective:   Vitals:   01/04/21 1032  BP: (!) 144/78  Pulse: 70  Temp: 98.3 F (36.8 C)  TempSrc: Oral  SpO2: 99%  Weight: 214 lb (97.1 kg)  Height: 5' 9.25" (1.759 m)    Estimated body mass index is 31.37 kg/m as calculated from the following:   Height as of this encounter: 5' 9.25" (1.759 m).   Weight as of this encounter: 214 lb (97.1 kg).  @WEIGHTCHANGE @  Filed Weights   01/04/21 1032  Weight: 214 lb (97.1 kg)     Physical ExamGeneral: No distress. Looks normal Neuro: Alert and Oriented x 3. GCS 15. Speech normal Psych: Pleasant Resp:  Barrel Chest - no.  Wheeze - no, Crackles - yes, No overt respiratory distress CVS: Normal heart sounds. Murmurs - no Ext: Stigmata of Connective Tissue Disease - no HEENT: Normal upper airway. PEERL +. No post nasal drip        Assessment:       ICD-10-CM   1. Interstitial pulmonary disease ([place])  J84.9     2. Moderate cigarette smoker (10-19 per day)  F17.210     3. Hepatic cirrhosis, unspecified hepatic cirrhosis type, unspecified whether ascites present ([place])  K74.60     4. Alcohol abuse  F10.10          Plan:  Patient Instructions  ILD (interstitial lung disease) ([place])  -Currently stable and this is due to smoking call smoking-related interstitial fibrosis [S RIF] but ongoung smoking and rheumatoid can make it worse   - supportive care is the plan  Plan -6 months do spirometry and dlco - return in 6 months   Moderate cigarette smoker (10-19 per day)  -Too bad this has been relapsed for   Plan --I hope you have quit smoking in the next few months  - consider Covid vaccine when you feel comfortable with data   Alcoholism and cirrhosis liver  Plan  - refer GI [facility], [place], Dr Collene Mares); whoever can see you first - check LFT 01/04/2021   Follow-up -6 months dodo spirometry and DLCO -Return to see Dr. [name] for a 30-minute face-to-face visit in ILD clinic or any day in 6  months after the test     SIGNATURE    Dr. Brand Males, M.D., F.C.C.P,  Pulmonary and Critical Care Medicine Staff Physician, [place] Director - Interstitial Lung Disease  Program  Pulmonary [place] [place], [place], [address]  Pager: [phone], If no answer or between  15:00h - 7:00h: call [phone] Telephone: [phone]  11:10 AM 01/04/2021

## 2021-01-04 NOTE — Telephone Encounter (Signed)
°  He has evidence acute alcoholic hepatitis as evidenced by increasing bilirubin, AST greater than ALT and a 2:1 ratio but with total levels to less than 300.  If he continues drinking he can end up in the hospital severely ill  Plan - Typical treatment for this is steroids.   -Can somebody and GI see him this week?-Any practice  Please let me know ASAP   LABS    PULMONARY No results for input(s): PHART, PCO2ART, PO2ART, HCO3, TCO2, O2SAT in the last 168 hours.  Invalid input(s): PCO2, PO2  CBC No results for input(s): HGB, HCT, WBC, PLT in the last 168 hours.  COAGULATION No results for input(s): INR in the last 168 hours.  CARDIAC  No results for input(s): TROPONINI in the last 168 hours. No results for input(s): PROBNP in the last 168 hours.   CHEMISTRY No results for input(s): NA, K, CL, CO2, GLUCOSE, BUN, CREATININE, CALCIUM, MG, PHOS in the last 168 hours. CrCl cannot be calculated (Patient's most recent lab result is older than the maximum 21 days allowed.).   LIVER Recent Labs  Lab 01/04/21 1115  AST 116*  ALT 54*  ALKPHOS 112  BILITOT 1.9*  PROT 8.0  ALBUMIN 3.8     INFECTIOUS No results for input(s): LATICACIDVEN, PROCALCITON in the last 168 hours.   ENDOCRINE CBG (last 3)  No results for input(s): GLUCAP in the last 72 hours.       IMAGING x48h  - image(s) personally visualized  -   highlighted in bold No results found.

## 2021-01-06 NOTE — Telephone Encounter (Signed)
Called and spoke with pt letting him know the results of labwork and asked if he had heard anything about referral to GI. Pt said that he has not heard anything yet. Stated to him that we would follow up on this as MR said he needs to be seen by any GI provider ASAP.  PCCs, please advise on this as referral was placed 12/10/20. Pt was given the phone number for [facility] after visit 12/13 and he tried to call but could not get anyone on the phone. Per MR, pt can see any GI who has any openings but does need to get in ASAP.

## 2021-01-12 NOTE — Telephone Encounter (Signed)
I called GI they have the orders they have been overwhelmed with Referrals I have called pt and had to leave a vm with GI # for him to call [phone] opt 1

## 2021-01-12 NOTE — Telephone Encounter (Signed)
Robert Greene have you checked on this?

## 2021-03-08 DIAGNOSIS — E785 Hyperlipidemia, unspecified: Secondary | ICD-10-CM | POA: Diagnosis not present

## 2021-03-08 DIAGNOSIS — Z720 Tobacco use: Secondary | ICD-10-CM | POA: Diagnosis not present

## 2021-03-08 DIAGNOSIS — G8929 Other chronic pain: Secondary | ICD-10-CM | POA: Diagnosis not present

## 2021-03-08 DIAGNOSIS — Z811 Family history of alcohol abuse and dependence: Secondary | ICD-10-CM | POA: Diagnosis not present

## 2021-03-08 DIAGNOSIS — Z79620 Long term (current) use of immunosuppressive biologic: Secondary | ICD-10-CM | POA: Diagnosis not present

## 2021-03-08 DIAGNOSIS — T7840XD Allergy, unspecified, subsequent encounter: Secondary | ICD-10-CM | POA: Diagnosis not present

## 2021-03-08 DIAGNOSIS — M069 Rheumatoid arthritis, unspecified: Secondary | ICD-10-CM | POA: Diagnosis not present

## 2021-03-08 DIAGNOSIS — M1990 Unspecified osteoarthritis, unspecified site: Secondary | ICD-10-CM | POA: Diagnosis not present

## 2021-03-08 DIAGNOSIS — I10 Essential (primary) hypertension: Secondary | ICD-10-CM | POA: Diagnosis not present

## 2021-03-08 DIAGNOSIS — E669 Obesity, unspecified: Secondary | ICD-10-CM | POA: Diagnosis not present

## 2021-03-08 DIAGNOSIS — Z6830 Body mass index (BMI) 30.0-30.9, adult: Secondary | ICD-10-CM | POA: Diagnosis not present

## 2021-03-08 DIAGNOSIS — Z008 Encounter for other general examination: Secondary | ICD-10-CM | POA: Diagnosis not present

## 2021-03-08 DIAGNOSIS — K219 Gastro-esophageal reflux disease without esophagitis: Secondary | ICD-10-CM | POA: Diagnosis not present

## 2021-03-09 ENCOUNTER — Ambulatory Visit: Payer: Medicare HMO | Admitting: Nurse Practitioner

## 2021-03-09 ENCOUNTER — Telehealth: Payer: Self-pay

## 2021-03-09 ENCOUNTER — Encounter: Payer: Self-pay | Admitting: Nurse Practitioner

## 2021-03-09 ENCOUNTER — Other Ambulatory Visit (INDEPENDENT_AMBULATORY_CARE_PROVIDER_SITE_OTHER): Payer: Medicare HMO

## 2021-03-09 VITALS — BP 99/60 | HR 60 | Ht 70.0 in | Wt 214.0 lb

## 2021-03-09 DIAGNOSIS — F1010 Alcohol abuse, uncomplicated: Secondary | ICD-10-CM

## 2021-03-09 DIAGNOSIS — K7030 Alcoholic cirrhosis of liver without ascites: Secondary | ICD-10-CM | POA: Diagnosis not present

## 2021-03-09 DIAGNOSIS — K7460 Unspecified cirrhosis of liver: Secondary | ICD-10-CM | POA: Diagnosis not present

## 2021-03-09 DIAGNOSIS — K625 Hemorrhage of anus and rectum: Secondary | ICD-10-CM

## 2021-03-09 DIAGNOSIS — R160 Hepatomegaly, not elsewhere classified: Secondary | ICD-10-CM | POA: Diagnosis not present

## 2021-03-09 DIAGNOSIS — D649 Anemia, unspecified: Secondary | ICD-10-CM

## 2021-03-09 DIAGNOSIS — R69 Illness, unspecified: Secondary | ICD-10-CM | POA: Diagnosis not present

## 2021-03-09 DIAGNOSIS — K219 Gastro-esophageal reflux disease without esophagitis: Secondary | ICD-10-CM | POA: Diagnosis not present

## 2021-03-09 LAB — COMPREHENSIVE METABOLIC PANEL
ALT: 29 U/L (ref 0–53)
AST: 43 U/L — ABNORMAL HIGH (ref 0–37)
Albumin: 3.8 g/dL (ref 3.5–5.2)
Alkaline Phosphatase: 101 U/L (ref 39–117)
BUN: 21 mg/dL (ref 6–23)
CO2: 23 mEq/L (ref 19–32)
Calcium: 10.1 mg/dL (ref 8.4–10.5)
Chloride: 103 mEq/L (ref 96–112)
Creatinine, Ser: 1.81 mg/dL — ABNORMAL HIGH (ref 0.40–1.50)
GFR: 40.04 mL/min — ABNORMAL LOW (ref >60.00)
Glucose, Bld: 114 mg/dL — ABNORMAL HIGH (ref 70–99)
Potassium: 3.9 mEq/L (ref 3.5–5.1)
Sodium: 135 mEq/L (ref 135–145)
Total Bilirubin: 1.4 mg/dL — ABNORMAL HIGH (ref 0.2–1.2)
Total Protein: 8.7 g/dL — ABNORMAL HIGH (ref 6.0–8.3)

## 2021-03-09 LAB — HEPATIC FUNCTION PANEL
ALT: 29 U/L (ref 0–53)
AST: 43 U/L — ABNORMAL HIGH (ref 0–37)
Albumin: 3.8 g/dL (ref 3.5–5.2)
Alkaline Phosphatase: 101 U/L (ref 39–117)
Bilirubin, Direct: 0.4 mg/dL — ABNORMAL HIGH (ref 0.0–0.3)
Total Bilirubin: 1.4 mg/dL — ABNORMAL HIGH (ref 0.2–1.2)
Total Protein: 8.7 g/dL — ABNORMAL HIGH (ref 6.0–8.3)

## 2021-03-09 LAB — CBC
HCT: 38.3 % — ABNORMAL LOW (ref 39.0–52.0)
Hemoglobin: 12.4 g/dL — ABNORMAL LOW (ref 13.0–17.0)
MCHC: 32.5 g/dL (ref 30.0–36.0)
MCV: 83.2 fl (ref 78.0–100.0)
Platelets: 114 10*3/uL — ABNORMAL LOW (ref 150.0–400.0)
RBC: 4.6 Mil/uL (ref 4.22–5.81)
RDW: 17.4 % — ABNORMAL HIGH (ref 11.5–15.5)
WBC: 8 10*3/uL (ref 4.0–10.5)

## 2021-03-09 LAB — IBC + FERRITIN
Ferritin: 32.1 ng/mL (ref 22.0–322.0)
Iron: 79 ug/dL (ref 42–165)
Saturation Ratios: 18 % — ABNORMAL LOW (ref 20.0–50.0)
TIBC: 439.6 ug/dL (ref 250.0–450.0)
Transferrin: 314 mg/dL (ref 212.0–360.0)

## 2021-03-09 LAB — B12 AND FOLATE PANEL
Folate: 13.2 ng/mL (ref >5.9)
Vitamin B-12: 820 pg/mL (ref 211–911)

## 2021-03-09 LAB — PROTIME-INR
INR: 1.2 ratio — ABNORMAL HIGH (ref 0.8–1.0)
Prothrombin Time: 13.1 s (ref 9.6–13.1)

## 2021-03-09 NOTE — Patient Instructions (Signed)
RECOMMENDATION(S):  Stop alcohol. Can take up to 2,000 mg tylenol daily. Follow a 2 gram sodium diet. We will contact you to schedule a follow up once we get your records from [facility]. Please Avoid all NSAID's. Some examples of NSAID's are as follows: Aspirin (Bufferin, Bayer, and Excedrin) Ibuprofen (Advil, Motrin, Nuprin) Ketoprofen (Actron, Orudis) Naproxen (Aleve) Daypro  Indocin  Lodine  Naprosyn  Relafen  Vimovo Voltaren  LABS:   Please proceed to the basement level for lab work before leaving today. Press "B" on the elevator. The lab is located at the first door on the left as you exit the elevator.  HEALTHCARE LAWS AND MY CHART RESULTS:   Due to recent changes in healthcare laws, you may see results of your imaging and/or laboratory studies on MyChart before I have had a chance to review them.  I understand that in some cases there may be results that are confusing or concerning to you. Please understand that not all results are received at the same time and often I may need to interpret multiple results in order to provide you with the best plan of care or course of treatment. Therefore, I ask that you please give me 48 hours to thoroughly review all your results before contacting my office for clarification.    BMI:  If you are age 20 or older, your body mass index should be between 23-30. Your Body mass index is 30.71 kg/m. If this is out of the aforementioned range listed, please consider follow up with your Primary Care Provider.  If you are age 28 or younger, your body mass index should be between 19-25. Your Body mass index is 30.71 kg/m. If this is out of the aformentioned range listed, please consider follow up with your Primary Care Provider.   MY CHART:  The [facility] providers would like to encourage you to use [facility] to communicate with providers for non-urgent requests or questions.  Due to long hold times on the telephone, sending your provider a message by  [facility] may be a faster and more efficient way to get a response.  Please allow 48 business hours for a response.  Please remember that this is for non-urgent requests.   Thank you for trusting me with your gastrointestinal care!    Tye Savoy, NP

## 2021-03-09 NOTE — Telephone Encounter (Signed)
ROI faxed to [facility] for colonoscopy records and any pathology reports.

## 2021-03-09 NOTE — Progress Notes (Signed)
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 Small amount of perihepatic ascites, otherwise no radiographic evidence for portal HTN. Still consuming ETOH. --Liver enzymes elevated in pattern of Etoh with AST of 116 / ALT 54. Tbili 1.9. Normal albumin. Platelets 77. --check INR --Obtain complete hepatic seriologic workup to rule out other causes of chronic liver disease.  --Lengthy discussion about necessity of stopping Etoh. He understands the importance of abstinence . Recommended AA.  --HCC screening : Suggested liver mass on chest CT. No liver lesions on MRI . Will obtain AFP --Varices screening. Will need an EGD. I will await records from [facility] before scheduling as he may also be due for a colonoscopy.  --Can use up to 2000 mg of Tylenol as needed for pain.  --He is HAV immune. Needs HBV vaccine, he will think about it.  --2,000 NA+ restricted diet daily. Has lower extremity edema and very small volume ascites on MRI. May need diuretics in near future.  --Here with daughter. She understands need to monitor for any mental status changes in patient  # Colon cancer screening. He has apparenlty had 2 colonoscopies with [facility]. Referred to [place] for liver disease so we can assume remainder of GI care --Get colonoscopy reports from [facility] --Once we receive these will bring patient back in to discuss timing of next colonoscopy ( and EGD for varices screening) and also continue with evaluation / treatment of cirrhosis.   # [place] anemia, new in November 2022. Saw PCP at the time because he was having "indigestion" / acid reflux / heartburn and rectal bleeding at the time. He was taking a lot of NSAIDs at the time. Started on Omeprazole , NSAIDs stopped. He hasn't had any further rectal bleeding or indigestion since . Still taking Omeprazole  --Repeat CBC today --Get colonoscopy reports from [facility] GI. Sounds like last colonoscoy was 5 years  ago, ? May of had a polyp. Will decide on date on next colonoscopy based on clinical course.   # GERD, symptoms resolved after recently starting a PPI --Continue daily PPI  # Rheumatoid Arthritis, on Embrel  # Interstitial Lung disease. Doesn't require oxygen.    HISTORY OF PRESENT ILLNESS     Chief Complaint : liver problems, acid reflux, history of colon polyps  Robert Greene is a 61 y.o. male with a past medical history significant for interstitial lung disease, tobacco abuse, rheumatoid arthritis, cirrhosis ( new) See PMH below for any additional history.   Patient referred by his pulmonologist for incidental findings of cirrhosis found on chest CT scan done for evaluation of interstitial lung disease.  Pulmonologist ordered an MRI for further evaluation and though this did suggest cirrhosis, no liver mass was seen.  The liver lesion on CT scan appeared to correspond with a partially visualized gallbladder.    Patient wasn't aware that he has cirrhosis. He has been drinking a gallon of vodka a week for a few years.  Prior to that was was drinking a 6 pack of beer + wine daily for 30 years. His father had liver disease but no details available.  Unfortunately Robert Greene continues to drink etoh. He hasn't had any vodka in 4 weeks, just beer and wine. He has gone several days without Etoh in the past without withdrawal symptoms. He thinks that he can stop drinking if puts mind to it.  He has no abdominal pain.  He was having rectal bleeding, acid reflux around November. He was taking a lot of NSAID. Saw a provider and was advised to stop NSAIDS ( he did). Started on Omeprazole and hasn't had any further reflux symptoms. No further rectal bleeding either.   Data Reviewed:  CBC Latest Ref Rng & Units 12/13/2020 12/08/2017 12/07/2017  WBC 4.0 - 10.5 K/uL 5.5 13.1(H) 16.4(H)  Hemoglobin 13.0 - 17.0 g/dL 12.0(L) 13.4 14.5  Hematocrit 39.0 - 52.0 % 36.6(L) 42.3 43.3  Platelets 150.0 - 400.0  K/uL 77.0(L) 155 164    No results found for: LIPASE CMP Latest Ref Rng & Units 01/04/2021 12/13/2020 04/09/2018  Glucose 70 - 99 mg/dL - 122(H) 149(H)  BUN 6 - 23 mg/dL - 10 9  Creatinine 0.40 - 1.50 mg/dL - 0.70 0.75(L)  Sodium 135 - 145 mEq/L - 137 142  Potassium 3.5 - 5.1 mEq/L - 3.8 4.2  Chloride 96 - 112 mEq/L - 105 99  CO2 19 - 32 mEq/L - 25 25  Calcium 8.4 - 10.5 mg/dL - 9.4 9.5  Total Protein 6.0 - 8.3 g/dL 8.0 7.9 7.1  Total Bilirubin 0.2 - 1.2 mg/dL 1.9(H) 1.1 0.3  Alkaline Phos 39 - 117 U/L 112 112 99  AST 0 - 37 U/L 116(H) 139(H) 82(H)  ALT 0 - 53 U/L 54(H) 60(H) 88(H)    Latest Reference Range & Units 12/13/20 11:10  Hep A Ab, IgM Negative  Negative  Hep A Ab, Total Negative  Positive !  Hepatitis B Surface Ag Negative  Negative  Hep B Surface Ab, Qual  Non Reactive  Hep B Core Total Ab Negative  Negative  !: Data is abnormal   MR ABDOMEN WWO CONTRAST CLINICAL DATA:  61 year old male with history of cirrhosis. Hepatic mass noted on recent CT examination. Follow-up study.  EXAM: MRI ABDOMEN WITHOUT AND WITH CONTRAST  TECHNIQUE: Multiplanar multisequence MR imaging of the abdomen was performed both before and after the administration of intravenous contrast.  CONTRAST:  66mL GADAVIST GADOBUTROL 1 MMOL/ML IV SOLN  COMPARISON:  No prior abdominal MRI.  CT the chest 12/09/2020.  FINDINGS: Lower chest: Unremarkable.  Hepatobiliary: Diffuse loss of signal intensity throughout the hepatic parenchyma on out of phase dual echo images, indicative of hepatic steatosis. Liver has a shrunken appearance and nodular contour, indicative of underlying cirrhosis. No suspicious cystic or solid hepatic lesions. The suspected lesion noted on the recent chest CT appears to correspond with a partially visualized gallbladder with trace amount of pericholecystic fluid no intra or extrahepatic biliary ductal dilatation. Gallbladder is moderately distended. No filling defect to  suggest gallstones. Common bile duct measures 5 mm in the porta hepatis. Trace volume of perihepatic ascites, most evident adjacent to the gallbladder.  Pancreas: No pancreatic mass. No pancreatic ductal dilatation. No pancreatic or peripancreatic fluid collections or inflammatory changes.  Spleen:  Unremarkable.  Adrenals/Urinary Tract: Bilateral kidneys and adrenal glands are normal in appearance. No hydroureteronephrosis in the visualized portions of the abdomen.  Stomach/Bowel: Visualized portions are unremarkable.  Vascular/Lymphatic: Aortic atherosclerosis, without evidence of aneurysm in the visualized abdomen. No lymphadenopathy.  Other: Trace volume of ascites noted adjacent to the liver. No larger volume of ascites noted in the visualized portions of the abdomen.  Musculoskeletal: No aggressive appearing osseous lesions are noted in the visualized portions of the skeleton.  IMPRESSION: 1. There is hepatic steatosis and morphologic changes in the liver indicative of underlying cirrhosis. However, there is no hepatic mass identified. The findings  on the recent chest CT appear to correspond with a partially visualized gallbladder, and trace amount of perihepatic ascites localized in part around the gallbladder. 2. Aortic atherosclerosis.  Electronically Signed   By: Vinnie Langton M.D.   On: 12/27/2020 13:33      PREVIOUS GI EVALUATIONS:   [facility]   Past Medical History:  Diagnosis Date   Alcoholism [facility])    Anxiety    Arthritis    rheumatoid   Coronary artery calcification seen on CAT scan 04/08/2018   Dyspnea    HTN (hypertension) 04/08/2018   Hypercholesteremia    Hypertension    Interstitial lung disease ([place])    Kidney stones 2020   Pulmonary fibrosis [facility])      Past Surgical History:  Procedure Laterality Date   KNEE CARTILAGE SURGERY Left    LUNG BIOPSY Right 12/06/2017   Procedure: LUNG BIOPSY;  Surgeon: Melrose Nakayama, MD;   Location: [facility] OR;  Service: Thoracic;  Laterality: Right;   VIDEO ASSISTED THORACOSCOPY Right 12/06/2017   Procedure: VIDEO ASSISTED THORACOSCOPY;  Surgeon: Melrose Nakayama, MD;  Location: [facility];  Service: Thoracic;  Laterality: Right;   Family History  Problem Relation Age of Onset   Cirrhosis Father    Colon cancer Neg Hx    Stomach cancer Neg Hx    Social History   Tobacco Use   Smoking status: Every Day    Packs/day: 1.50    Years: 40.00    Pack years: 60.00    Types: Cigarettes   Smokeless tobacco: Never   Tobacco comments:    Currently smoking 3/4ppd as of 01/04/21 ep  Vaping Use   Vaping Use: Every day  Substance Use Topics   Alcohol use: Yes    Alcohol/week: 1.0 standard drink    Types: 1 Standard drinks or equivalent per week    Comment: daily, about 3 to 5 a day   Drug use: No   Current Outpatient Medications  Medication Sig Dispense Refill   amLODipine (NORVASC) 5 MG tablet TAKE 1 TABLET BY MOUTH EVERY DAY 90 tablet 0   atorvastatin (LIPITOR) 80 MG tablet Take 80 mg by mouth daily.  1   diphenhydrAMINE (BENADRYL) 25 MG tablet Take 25 mg by mouth daily as needed for itching.      EPINEPHrine 0.3 mg/0.3 mL IJ SOAJ injection Use once.  If ineffective, use 2nd dose. 2 Device 0   etanercept (ENBREL) 50 MG/ML injection Inject 50 mg into the skin once a week.     hydrochlorothiazide (HYDRODIURIL) 12.5 MG tablet TAKE 1 TABLET BY MOUTH EVERY DAY 90 tablet 1   losartan (COZAAR) 100 MG tablet TAKE 1 TABLET BY MOUTH EVERY DAY 90 tablet 1   Omega-3 Fatty Acids (FISH OIL PO) Take 2 capsules by mouth 2 (two) times daily.      omeprazole (PRILOSEC) 20 MG capsule Take 20 mg by mouth daily.     hydroxychloroquine (PLAQUENIL) 200 MG tablet Take 200 mg by mouth daily.  (Patient not taking: Reported on 03/09/2021)  2   No current facility-administered medications for this visit.   Allergies  Allergen Reactions   Bee Venom Anaphylaxis, Hives and Rash   Spider Antivenin [Black  Widow Spider Antivenin (L.Mactans)] Anaphylaxis, Hives and Rash     Review of Systems: Positive for allergy, sinus problem, arthritis, muscle pain and cramps, excessive urination, swelling of feet and legs and sleeping for.  All other systems reviewed and negative except where noted in  HPI.    PHYSICAL EXAM :    Wt Readings from Last 3 Encounters:  03/09/21 214 lb (97.1 kg)  01/04/21 214 lb (97.1 kg)  10/22/20 212 lb 6.4 oz (96.3 kg)    BP 99/60    Pulse 60    Ht 5\' 10"  (1.778 m)    Wt 214 lb (97.1 kg)    SpO2 97%    BMI 30.71 kg/m  Constitutional:  Generally well appearing male in no acute distress. Psychiatric: Pleasant. Normal mood and affect. Behavior is normal. EENT: Pupils normal.  Conjunctivae are normal. No scleral icterus. Neck supple.  Cardiovascular: Normal rate, regular rhythm., 2-3 + BLE edema Pulmonary/chest: Effort normal and breath sounds normal. No wheezing, rales or rhonchi. Abdominal: Soft, nondistended, nontender. Mid upper abdomen firm ( ? Cirrhotic liver). Bowel sounds active throughout.  Neurological: Alert and oriented to person place and time. Skin: Skin is warm and dry. No rashes noted.  Tye Savoy, NP  03/09/2021, 10:35 AM  Cc:  Referring Provider Brand Males, MD

## 2021-03-10 ENCOUNTER — Telehealth: Payer: Self-pay

## 2021-03-10 NOTE — Progress Notes (Signed)
Attending Physician's Attestation   I have reviewed the chart.   I agree with the Advanced Practitioner's note, impression, and recommendations with any updates as below. Agree with endoscopic evaluation from above at least and if he is due for colonoscopy to be done at the same time.  Pending of the patient's weight in the coming weeks/months we will consider diuretic therapy as outlined.   Justice Britain, MD [facility] Advanced Endoscopy Office # [phone]

## 2021-03-10 NOTE — Telephone Encounter (Signed)
-----   Message from Eleanora Neighbor, [place] sent at 03/09/2021 11:37 AM EST ----- Once records from [facility] arrive, patient needs scheduled for a follow up with Nevin Bloodgood

## 2021-03-10 NOTE — Telephone Encounter (Signed)
ROI request faxed to [facility].

## 2021-03-12 LAB — TISSUE TRANSGLUTAMINASE ABS,IGG,IGA
(tTG) Ab, IgA: <1 U/mL
(tTG) Ab, IgG: <1 U/mL

## 2021-03-12 LAB — CERULOPLASMIN: Ceruloplasmin: 30 mg/dL (ref 18–36)

## 2021-03-12 LAB — ANA: Anti Nuclear Antibody (ANA): POSITIVE — AB

## 2021-03-12 LAB — HEPATITIS C ANTIBODY
Hepatitis C Ab: NONREACTIVE
SIGNAL TO CUT-OFF: 0.04 (ref <1.00)

## 2021-03-12 LAB — AFP TUMOR MARKER: AFP-Tumor Marker: 4.6 ng/mL (ref <6.1)

## 2021-03-12 LAB — ANTI-NUCLEAR AB-TITER (ANA TITER)
ANA TITER: 1:320 {titer} — ABNORMAL HIGH
ANA Titer 1: 1:40 {titer} — ABNORMAL HIGH

## 2021-03-12 LAB — MITOCHONDRIAL ANTIBODIES: Mitochondrial M2 Ab, IgG: <=20 U (ref <=20.0)

## 2021-03-12 LAB — IGA: Immunoglobulin A: 672 mg/dL — ABNORMAL HIGH (ref 47–310)

## 2021-03-12 LAB — ANTI-SMOOTH MUSCLE ANTIBODY, IGG: Actin (Smooth Muscle) Antibody (IGG): <20 U (ref <20)

## 2021-03-12 LAB — ALPHA-1-ANTITRYPSIN: A-1 Antitrypsin, Ser: 214 mg/dL — ABNORMAL HIGH (ref 83–199)

## 2021-03-13 ENCOUNTER — Other Ambulatory Visit: Payer: Self-pay | Admitting: Student

## 2021-03-25 ENCOUNTER — Other Ambulatory Visit: Payer: Self-pay | Admitting: Cardiology

## 2021-04-03 ENCOUNTER — Other Ambulatory Visit: Payer: Self-pay | Admitting: Cardiology

## 2021-04-07 DIAGNOSIS — J449 Chronic obstructive pulmonary disease, unspecified: Secondary | ICD-10-CM | POA: Diagnosis not present

## 2021-04-07 DIAGNOSIS — R748 Abnormal levels of other serum enzymes: Secondary | ICD-10-CM | POA: Diagnosis not present

## 2021-04-07 DIAGNOSIS — M069 Rheumatoid arthritis, unspecified: Secondary | ICD-10-CM | POA: Diagnosis not present

## 2021-04-07 DIAGNOSIS — E7800 Pure hypercholesterolemia, unspecified: Secondary | ICD-10-CM | POA: Diagnosis not present

## 2021-04-07 DIAGNOSIS — I10 Essential (primary) hypertension: Secondary | ICD-10-CM | POA: Diagnosis not present

## 2021-04-07 DIAGNOSIS — I872 Venous insufficiency (chronic) (peripheral): Secondary | ICD-10-CM | POA: Diagnosis not present

## 2021-04-07 DIAGNOSIS — R7309 Other abnormal glucose: Secondary | ICD-10-CM | POA: Diagnosis not present

## 2021-04-07 DIAGNOSIS — Z0000 Encounter for general adult medical examination without abnormal findings: Secondary | ICD-10-CM | POA: Diagnosis not present

## 2021-04-07 DIAGNOSIS — I779 Disorder of arteries and arterioles, unspecified: Secondary | ICD-10-CM | POA: Diagnosis not present

## 2021-04-07 DIAGNOSIS — Z125 Encounter for screening for malignant neoplasm of prostate: Secondary | ICD-10-CM | POA: Diagnosis not present

## 2021-04-07 DIAGNOSIS — K760 Fatty (change of) liver, not elsewhere classified: Secondary | ICD-10-CM | POA: Diagnosis not present

## 2021-04-07 NOTE — Telephone Encounter (Signed)
Records received and given to [facility]. ?

## 2021-05-02 ENCOUNTER — Other Ambulatory Visit: Payer: Self-pay | Admitting: Student

## 2021-05-16 DIAGNOSIS — J849 Interstitial pulmonary disease, unspecified: Secondary | ICD-10-CM | POA: Diagnosis not present

## 2021-05-16 DIAGNOSIS — M353 Polymyalgia rheumatica: Secondary | ICD-10-CM | POA: Diagnosis not present

## 2021-05-16 DIAGNOSIS — M25461 Effusion, right knee: Secondary | ICD-10-CM | POA: Diagnosis not present

## 2021-05-16 DIAGNOSIS — I7300 Raynaud's syndrome without gangrene: Secondary | ICD-10-CM | POA: Diagnosis not present

## 2021-05-16 DIAGNOSIS — M0609 Rheumatoid arthritis without rheumatoid factor, multiple sites: Secondary | ICD-10-CM | POA: Diagnosis not present

## 2021-05-16 DIAGNOSIS — R7989 Other specified abnormal findings of blood chemistry: Secondary | ICD-10-CM | POA: Diagnosis not present

## 2021-05-16 DIAGNOSIS — Z6832 Body mass index (BMI) 32.0-32.9, adult: Secondary | ICD-10-CM | POA: Diagnosis not present

## 2021-05-16 DIAGNOSIS — Z7952 Long term (current) use of systemic steroids: Secondary | ICD-10-CM | POA: Diagnosis not present

## 2021-05-16 DIAGNOSIS — E669 Obesity, unspecified: Secondary | ICD-10-CM | POA: Diagnosis not present

## 2021-05-25 ENCOUNTER — Ambulatory Visit: Payer: Medicare HMO | Admitting: Nurse Practitioner

## 2021-05-25 ENCOUNTER — Encounter: Payer: Self-pay | Admitting: Nurse Practitioner

## 2021-05-25 ENCOUNTER — Other Ambulatory Visit (INDEPENDENT_AMBULATORY_CARE_PROVIDER_SITE_OTHER): Payer: Medicare HMO

## 2021-05-25 VITALS — BP 120/62 | HR 66 | Ht 69.0 in | Wt 222.1 lb

## 2021-05-25 DIAGNOSIS — K219 Gastro-esophageal reflux disease without esophagitis: Secondary | ICD-10-CM

## 2021-05-25 DIAGNOSIS — K7030 Alcoholic cirrhosis of liver without ascites: Secondary | ICD-10-CM

## 2021-05-25 DIAGNOSIS — Z860100 Personal history of colon polyps, unspecified: Secondary | ICD-10-CM

## 2021-05-25 DIAGNOSIS — D509 Iron deficiency anemia, unspecified: Secondary | ICD-10-CM | POA: Diagnosis not present

## 2021-05-25 DIAGNOSIS — R69 Illness, unspecified: Secondary | ICD-10-CM | POA: Diagnosis not present

## 2021-05-25 LAB — COMPREHENSIVE METABOLIC PANEL
ALT: 22 U/L (ref 0–53)
AST: 36 U/L (ref 0–37)
Albumin: 3.9 g/dL (ref 3.5–5.2)
Alkaline Phosphatase: 121 U/L — ABNORMAL HIGH (ref 39–117)
BUN: 12 mg/dL (ref 6–23)
CO2: 25 mEq/L (ref 19–32)
Calcium: 9.4 mg/dL (ref 8.4–10.5)
Chloride: 103 mEq/L (ref 96–112)
Creatinine, Ser: 0.76 mg/dL (ref 0.40–1.50)
GFR: 97.31 mL/min (ref >60.00)
Glucose, Bld: 122 mg/dL — ABNORMAL HIGH (ref 70–99)
Potassium: 3.7 mEq/L (ref 3.5–5.1)
Sodium: 135 mEq/L (ref 135–145)
Total Bilirubin: 1.4 mg/dL — ABNORMAL HIGH (ref 0.2–1.2)
Total Protein: 7.7 g/dL (ref 6.0–8.3)

## 2021-05-25 LAB — CBC
HCT: 36.7 % — ABNORMAL LOW (ref 39.0–52.0)
Hemoglobin: 12.2 g/dL — ABNORMAL LOW (ref 13.0–17.0)
MCHC: 33.1 g/dL (ref 30.0–36.0)
MCV: 84.9 fl (ref 78.0–100.0)
Platelets: 99 10*3/uL — ABNORMAL LOW (ref 150.0–400.0)
RBC: 4.33 Mil/uL (ref 4.22–5.81)
RDW: 16.3 % — ABNORMAL HIGH (ref 11.5–15.5)
WBC: 5.7 10*3/uL (ref 4.0–10.5)

## 2021-05-25 LAB — PROTIME-INR
INR: 1.2 ratio — ABNORMAL HIGH (ref 0.8–1.0)
Prothrombin Time: 12.7 s (ref 9.6–13.1)

## 2021-05-25 NOTE — Patient Instructions (Signed)
We have scheduled you a follow up with Dr. Rush Landmark on 07/12/21 at 9:50 am. ? ?Please try to find someone who can accompany you for an EGD. We will schedule you at that time. ? ?Continue Prilosec. ?Stop alcohol. ?Your next colonoscopy is due Feb 2025. Our office will contact you closer to that date. ? ?Please proceed to the basement level for lab work before leaving today. Press "B" on the elevator. The lab is located at the first door on the left as you exit the elevator. ? ?HEALTHCARE LAWS AND MY CHART RESULTS:  ? ?Due to recent changes in healthcare laws, you may see results of your imaging and/or laboratory studies on MyChart before I have had a chance to review them.  I understand that in some cases there may be results that are confusing or concerning to you. Please understand that not all results are received at the same time and often I may need to interpret multiple results in order to provide you with the best plan of care or course of treatment. Therefore, I ask that you please give me 48 hours to thoroughly review all your results before contacting my office for clarification.  ? ?Thank you for trusting me with your gastrointestinal care!   ? ?Tye Savoy, NP ? ? ?BMI: ? ?If you are age 47 or older, your body mass index should be between 23-30. Your Body mass index is 32.8 kg/m?Marland Kitchen If this is out of the aforementioned range listed, please consider follow up with your Primary Care Provider. ? ?If you are age 31 or younger, your body mass index should be between 19-25. Your Body mass index is 32.8 kg/m?Marland Kitchen If this is out of the aformentioned range listed, please consider follow up with your Primary Care Provider.  ? ?MY CHART: ? ?The [place] providers would like to encourage you to use [facility] to communicate with providers for non-urgent requests or questions.  Due to long hold times on the telephone, sending your provider a message by [facility] may be a faster and more efficient way to get a response.   Please allow 48 business hours for a response.  Please remember that this is for non-urgent requests.  ? ?

## 2021-05-25 NOTE — Progress Notes (Signed)
Attending Physician's Attestation  ? ?I have reviewed the chart.  ? ?I agree with the Advanced Practitioner's note, impression, and recommendations with any updates as below. ?Agree with need for endoscopic evaluation but certainly up to the patient to decide what he feels comfortable doing.  We will see him in follow-up. ? ? ?Justice Britain, MD ?[facility] ?Advanced Endoscopy ?Office # [phone] ? ?

## 2021-05-25 NOTE — Progress Notes (Signed)
? ?Assessment   ? ?Patient Profile:  ?Robert Greene is a 61 y.o. male known to Dr. Rush Landmark with a past medical history of alcohol abuse, cirrhosis by CT scan / MRI, GERD, HTN, HLD, pulmonary fibrosis, rheumatoid arthritis.  Additional medical history as listed in [place] . ? ?Etoh abuse  / cirrhosis on MRI ?Hepatic serologic workup evaluation normal except for elevated ANA. Cirrhosis probably Etoh related. MELD 18 ( probably high due to AKI).   MRI >>shrunken appearance and nodular contour, indicative of underlying cirrhosis. Trace ascites but no other suggestion of associated portal HTN. No focal liver lesions.  Platelets 114,  INR 1.2,  Albumin 3.8. He has stopped liquor but still drinking 1.5 beers a day  ?  ?Lower extremity edema.  ?Trace ascites on MRI in December. He hasn't noticed any abdominal distention. Not sure edema is related to cirrhosis. On HCTZ, followed by Cardiology. Suspect he isn't closely following a sodium restricted diet.  ? ?Positive ANA 1: 320 titer.  ?May ned eventually referral to Rheumatology.  ? ?AKI, last labs were in mid February while taking NSAIDS. No NSAIDS in last 2 months.  ? ?GERD ?Asymptomatic since starting daily Prilosec ? ?History of colon polyps.  ?Had an 8 mm tubular in 2018.  ? ?[place] anemia, probably IDA with TIBC being high normal, percent iron sat low and low normal ferritin of 33.  ?Had been using NSAID, not stopped.. Hgb stable at 12.4 but down from baseline on 13.4 in 2019.   ? ?RA, on Enbrel ? ?Plan  ? ? ?Update labs today : CMET, CBC, INR ?Recommend HBV vaccine at follow visit. He is immune to HAV ?Discussed need to totally avoid Etoh ?[place] screening: No liver lesions on MRI Dec 2022. Normal AFP in Feb 2023. Should get a 6 month [place] in June ?Varices screening: He isn't convinced about having a EGD for  varices screening . We discussed that it is standard of care, can help prevent life threatening event. He will need to talk with family member to find a care partner.   ?Based on polyp surveillance guideline he would be due for a 7 year follow early Feb 2025 however he does have a mild, new anemia which is probably iron deficient. Can discuss colonoscopy + EGD ( if patient willing) at follow up visit in June.   ?Follow up with Dr. Rush Landmark in late June.  ?Again, discussed need to total Etoh cessation ? ?History of present illness: ? ?Chief Complaint : follow up on liver disease, colon polyps, GERD ? ?At time of our visit in Feb 2023 he was drinking a gallon of liqour a week. Since we talked about Etoh cessation he stopped drinking for 10 days but had sweats for a week. Now is is drinking about 1.5 beers a day. We had discussed the need to stop taking NSAIDS and he has done so.  He complains of swelling of lower extremity swelling.Trying to limit salt intake and is taking HCTZ. No noticeable abdominal swelling. No abdominal pain. Feels okay. No blood in stool ? ?CT scan in November suggested cirrhosis with possible liver mass. Follow up MRI also suggested cirrhosis. No liver mass. The findings on CT scan may have been a partially visualized gallbladder.  ? ? ?Laboratory data: ? ?  Latest Ref Rng & Units 03/09/2021  ? 11:31 AM 01/04/2021  ? 11:15 AM 12/13/2020  ? 11:10 AM  ?Hepatic Function  ?Total Protein 6.0 - 8.3 g/dL 8.7    ?  8.7   8.0   7.9    ?Albumin 3.5 - 5.2 g/dL 3.8    ? 3.8   3.8   3.8    ?AST 0 - 37 U/L 43    ? 43   116   139    ?ALT 0 - 53 U/L 29    ? 29   54   60    ?Alk Phosphatase 39 - 117 U/L 101    ? 101   112   112    ?Total Bilirubin 0.2 - 1.2 mg/dL 1.4    ? 1.4   1.9   1.1    ?Bilirubin, Direct 0.0 - 0.3 mg/dL 0.4   0.7   0.4    ? ? ?Previous GI Evaluations: ?Endoscopies:  ?Feb 2018 polyp surveillance colonoscopy ( [facility]) ?One 8 mm descending colon polyp removed - tubular adenoma ?Exam otherwise normal.  ? ? ?Imaging:  ?MR ABDOMEN WWO CONTRAST ?CLINICAL DATA:  61 year old male with history of cirrhosis. Hepatic ?mass noted on recent CT examination. Follow-up  study. ?  ?EXAM: ?MRI ABDOMEN WITHOUT AND WITH CONTRAST ?  ?TECHNIQUE: ?Multiplanar multisequence MR imaging of the abdomen was performed ?both before and after the administration of intravenous contrast. ?  ?CONTRAST:  31m GADAVIST GADOBUTROL 1 MMOL/ML IV SOLN ?  ?COMPARISON:  No prior abdominal MRI.  CT the chest 12/09/2020. ?  ?FINDINGS: ?Lower chest: Unremarkable. ?  ?Hepatobiliary: Diffuse loss of signal intensity throughout the ?hepatic parenchyma on out of phase dual echo images, indicative of ?hepatic steatosis. Liver has a shrunken appearance and nodular ?contour, indicative of underlying cirrhosis. No suspicious cystic or ?solid hepatic lesions. The suspected lesion noted on the recent ?chest CT appears to correspond with a partially visualized ?gallbladder with trace amount of pericholecystic fluid no intra or ?extrahepatic biliary ductal dilatation. Gallbladder is moderately ?distended. No filling defect to suggest gallstones. Common bile duct ?measures 5 mm in the porta hepatis. Trace volume of perihepatic ?ascites, most evident adjacent to the gallbladder. ?  ?Pancreas: No pancreatic mass. No pancreatic ductal dilatation. No ?pancreatic or peripancreatic fluid collections or inflammatory ?changes. ?  ?Spleen:  Unremarkable. ?  ?Adrenals/Urinary Tract: Bilateral kidneys and adrenal glands are ?normal in appearance. No hydroureteronephrosis in the visualized ?portions of the abdomen. ?  ?Stomach/Bowel: Visualized portions are unremarkable. ?  ?Vascular/Lymphatic: Aortic atherosclerosis, without evidence of ?aneurysm in the visualized abdomen. No lymphadenopathy. ?  ?Other: Trace volume of ascites noted adjacent to the liver. No ?larger volume of ascites noted in the visualized portions of the ?abdomen. ?  ?Musculoskeletal: No aggressive appearing osseous lesions are noted ?in the visualized portions of the skeleton. ?  ?IMPRESSION: ?1. There is hepatic steatosis and morphologic changes in the  liver ?indicative of underlying cirrhosis. However, there is no hepatic ?mass identified. The findings on the recent chest CT appear to ?correspond with a partially visualized gallbladder, and trace amount ?of perihepatic ascites localized in part around the gallbladder. ?2. Aortic atherosclerosis. ?  ?Electronically Signed ?  By: DVinnie LangtonM.D. ?  On: 12/27/2020 13:33 ? ? ?Past Medical History:  ?Diagnosis Date  ? [place]ism ([place]   ? Anxiety   ? Arthritis   ? rheumatoid  ? Coronary artery calcification seen on CAT scan 04/08/2018  ? Dyspnea   ? HTN (hypertension) 04/08/2018  ? Hypercholesteremia   ? Hypertension   ? [place] disease [place]   ? Kidney stones 2020  ? Pulmonary fibrosis ([place]   ? ? ?  Past Surgical History:  ?Procedure Laterality Date  ? KNEE CARTILAGE SURGERY Left   ? LUNG BIOPSY Right 12/06/2017  ? Procedure: LUNG BIOPSY;  Surgeon: Melrose Nakayama, MD;  Location: [facility];  Service: Thoracic;  Laterality: Right;  ? VIDEO ASSISTED THORACOSCOPY Right 12/06/2017  ? Procedure: VIDEO ASSISTED THORACOSCOPY;  Surgeon: Melrose Nakayama, MD;  Location: [facility];  Service: Thoracic;  Laterality: Right;  ? ? ?Current Medications, Allergies, Family History and Social History were reviewed in [employer] record. ?  ?  ?Current Outpatient Medications  ?Medication Sig Dispense Refill  ? amLODipine (NORVASC) 5 MG tablet TAKE 1 TABLET BY MOUTH EVERY DAY 90 tablet 0  ? atorvastatin (LIPITOR) 80 MG tablet Take 80 mg by mouth daily.  1  ? diphenhydrAMINE (BENADRYL) 25 MG tablet Take 25 mg by mouth daily as needed for itching.     ? etanercept (ENBREL) 50 MG/ML injection Inject 50 mg into the skin once a week.    ? gabapentin (NEURONTIN) 100 MG capsule Take 1 capsule by mouth daily.    ? hydrochlorothiazide (HYDRODIURIL) 12.5 MG tablet TAKE 1 TABLET BY MOUTH EVERY DAY 90 tablet 1  ? hydroxychloroquine (PLAQUENIL) 200 MG tablet Take 200 mg by mouth daily.  2  ? losartan (COZAAR)  100 MG tablet TAKE 1 TABLET BY MOUTH EVERY DAY 90 tablet 1  ? Omega-3 Fatty Acids (FISH OIL PO) Take 2 capsules by mouth 2 (two) times daily.     ? omeprazole (PRILOSEC) 20 MG capsule Take 20 mg by mouth dail

## 2021-07-08 DIAGNOSIS — R7309 Other abnormal glucose: Secondary | ICD-10-CM | POA: Diagnosis not present

## 2021-07-12 ENCOUNTER — Other Ambulatory Visit (INDEPENDENT_AMBULATORY_CARE_PROVIDER_SITE_OTHER): Payer: Medicare HMO

## 2021-07-12 ENCOUNTER — Ambulatory Visit: Payer: Medicare HMO | Admitting: Gastroenterology

## 2021-07-12 ENCOUNTER — Encounter: Payer: Self-pay | Admitting: Gastroenterology

## 2021-07-12 VITALS — BP 110/62 | HR 64 | Ht 69.0 in | Wt 213.0 lb

## 2021-07-12 DIAGNOSIS — K7030 Alcoholic cirrhosis of liver without ascites: Secondary | ICD-10-CM | POA: Diagnosis not present

## 2021-07-12 DIAGNOSIS — Z86010 Personal history of colonic polyps: Secondary | ICD-10-CM | POA: Diagnosis not present

## 2021-07-12 DIAGNOSIS — F1020 Alcohol dependence, uncomplicated: Secondary | ICD-10-CM

## 2021-07-12 DIAGNOSIS — D649 Anemia, unspecified: Secondary | ICD-10-CM | POA: Diagnosis not present

## 2021-07-12 DIAGNOSIS — R69 Illness, unspecified: Secondary | ICD-10-CM | POA: Diagnosis not present

## 2021-07-12 LAB — COMPREHENSIVE METABOLIC PANEL
ALT: 35 U/L (ref 0–53)
AST: 39 U/L — ABNORMAL HIGH (ref 0–37)
Albumin: 3.9 g/dL (ref 3.5–5.2)
Alkaline Phosphatase: 117 U/L (ref 39–117)
BUN: 19 mg/dL (ref 6–23)
CO2: 25 mEq/L (ref 19–32)
Calcium: 9.8 mg/dL (ref 8.4–10.5)
Chloride: 104 mEq/L (ref 96–112)
Creatinine, Ser: 1.58 mg/dL — ABNORMAL HIGH (ref 0.40–1.50)
GFR: 47.02 mL/min — ABNORMAL LOW (ref >60.00)
Glucose, Bld: 124 mg/dL — ABNORMAL HIGH (ref 70–99)
Potassium: 4.3 mEq/L (ref 3.5–5.1)
Sodium: 137 mEq/L (ref 135–145)
Total Bilirubin: 1.3 mg/dL — ABNORMAL HIGH (ref 0.2–1.2)
Total Protein: 7.9 g/dL (ref 6.0–8.3)

## 2021-07-12 LAB — CBC
HCT: 39.4 % (ref 39.0–52.0)
Hemoglobin: 12.7 g/dL — ABNORMAL LOW (ref 13.0–17.0)
MCHC: 32.3 g/dL (ref 30.0–36.0)
MCV: 82.4 fl (ref 78.0–100.0)
Platelets: 118 10*3/uL — ABNORMAL LOW (ref 150.0–400.0)
RBC: 4.78 Mil/uL (ref 4.22–5.81)
RDW: 15.9 % — ABNORMAL HIGH (ref 11.5–15.5)
WBC: 6.5 10*3/uL (ref 4.0–10.5)

## 2021-07-12 LAB — IBC + FERRITIN
Ferritin: 25.5 ng/mL (ref 22.0–322.0)
Iron: 55 ug/dL (ref 42–165)
Saturation Ratios: 11.2 % — ABNORMAL LOW (ref 20.0–50.0)
TIBC: 491.4 ug/dL — ABNORMAL HIGH (ref 250.0–450.0)
Transferrin: 351 mg/dL (ref 212.0–360.0)

## 2021-07-12 LAB — RETICULOCYTES
ABS Retic: 63570 cells/uL (ref 25000–90000)
Retic Ct Pct: 1.3 %

## 2021-07-12 LAB — PROTIME-INR
INR: 1.1 ratio — ABNORMAL HIGH (ref 0.8–1.0)
Prothrombin Time: 12.3 s (ref 9.6–13.1)

## 2021-07-12 LAB — FOLATE: Folate: 17.9 ng/mL (ref >5.9)

## 2021-07-12 LAB — VITAMIN B12: Vitamin B-12: 547 pg/mL (ref 211–911)

## 2021-07-12 NOTE — Progress Notes (Unsigned)
[place] VISIT   Primary Care Provider Collene Leyden, MD [place] [address] [phone]  Referring Provider Collene Leyden, MD [place],  [place] [address] [phone]  Patient Profile: Robert Greene is a 61 y.o. male with a pmh significant for  The patient presents to the [facility] for an evaluation and management of problem(s) noted below:  Problem List 1. Alcoholic cirrhosis, unspecified whether ascites present ([place])   2. Anemia, unspecified type     History of Present Illness    The patient does/does not take NSAIDs or BC/Goody Powder. Patient has/has not had an EGD. Patient has/has not had a Colonoscopy.  GI Review of Systems Positive as above Negative for  Pyrosis; Reflux; Regurgitation; Dysphagia; Odynophagia; Globus; Post-prandial cough; Nocturnal cough; Nasal regurgitation; Epigastric pain; Nausea; Vomiting; Hematemesis; Jaundice; Change in Appetite; Early satiety; Abdominal pain; Abdominal bloating; Eructation; Flatulence; Change in BM Frequency; Change in BM Consistency; Constipation; Diarrhea; Incontinence; Urgency; Tenesmus; Hematochezia; Melena  Review of Systems General: Denies fevers/chills/weight loss/night sweats HEENT: Denies oral lesions/sore throat/headaches/visual changes Cardiovascular: Denies chest pain/palpitations Pulmonary: Denies shortness of breath/cough Gastroenterological: See HPI Genitourinary: Denies darkened urine or hematuria Hematological: Denies easy bruising/bleeding Endocrine: Denies temperature intolerance Dermatological: Denies skin changes Psychological: Mood is stable Allergy & Immunology: Denies severe allergic reactions Musculoskeletal: Denies new arthralgias   Medications Current Outpatient Medications  Medication Sig Dispense Refill   amLODipine (NORVASC) 5 MG tablet TAKE 1 TABLET BY MOUTH EVERY DAY 90 tablet 0    atorvastatin (LIPITOR) 80 MG tablet Take 80 mg by mouth daily.  1   diphenhydrAMINE (BENADRYL) 25 MG tablet Take 25 mg by mouth daily as needed for itching.      EPINEPHrine 0.3 mg/0.3 mL IJ SOAJ injection Use once.  If ineffective, use 2nd dose. 2 Device 0   etanercept (ENBREL) 50 MG/ML injection Inject 50 mg into the skin once a week.     hydrochlorothiazide (HYDRODIURIL) 12.5 MG tablet TAKE 1 TABLET BY MOUTH EVERY DAY 90 tablet 1   losartan (COZAAR) 100 MG tablet TAKE 1 TABLET BY MOUTH EVERY DAY 90 tablet 1   Omega-3 Fatty Acids (FISH OIL PO) Take 2 capsules by mouth 2 (two) times daily.      omeprazole (PRILOSEC) 20 MG capsule Take 20 mg by mouth daily.     hydroxychloroquine (PLAQUENIL) 200 MG tablet Take 200 mg by mouth daily. (Patient not taking: Reported on 07/12/2021)  2   No current facility-administered medications for this visit.    Allergies Allergies  Allergen Reactions   Bee Venom Anaphylaxis, Hives and Rash   Spider Antivenin [S Black Widow (Latrodec Mactans) Antivenin] Anaphylaxis, Hives and Rash    Histories Past Medical History:  Diagnosis Date   Alcoholism ([place])    Anxiety    Arthritis    rheumatoid   Coronary artery calcification seen on CAT scan 04/08/2018   Dyspnea    HTN (hypertension) 04/08/2018   Hypercholesteremia    Hypertension    Interstitial lung disease ([place])    Kidney stones 2020   Pulmonary fibrosis ([place])    Past Surgical History:  Procedure Laterality Date   KNEE CARTILAGE SURGERY Left    LUNG BIOPSY Right 12/06/2017   Procedure: LUNG BIOPSY;  Surgeon: Melrose Nakayama, MD;  Location: [place];  Service: Thoracic;  Laterality: Right;   VIDEO ASSISTED THORACOSCOPY Right 12/06/2017   Procedure: VIDEO ASSISTED THORACOSCOPY;  Surgeon: Modesto Charon  C, MD;  Location: [facility];  Service: Thoracic;  Laterality: Right;   Social History   Socioeconomic History   Marital status: Married    Spouse name: Not on file   Number of children:  2   Years of education: Not on file   Highest education level: Not on file  Occupational History   Not on file  Tobacco Use   Smoking status: Every Day    Packs/day: 1.50    Years: 40.00    Total pack years: 60.00    Types: Cigarettes   Smokeless tobacco: Never   Tobacco comments:    Currently smoking 3/4ppd as of 01/04/21 ep  Vaping Use   Vaping Use: Every day  Substance and Sexual Activity   Alcohol use: Yes    Alcohol/week: 1.0 standard drink of alcohol    Types: 1 Standard drinks or equivalent per week    Comment: daily, about 3 to 5 a day   Drug use: No   Sexual activity: Yes    Birth control/protection: None  Other Topics Concern   Not on file  Social History Narrative   Not on file   Social Determinants of Health   Financial Resource Strain: Not on file  Food Insecurity: Not on file  Transportation Needs: Not on file  Physical Activity: Not on file  Stress: Not on file  Social Connections: Not on file  Intimate Partner Violence: Not on file   Family History  Problem Relation Age of Onset   Cirrhosis Father    Colon cancer Neg Hx    Stomach cancer Neg Hx    Esophageal cancer Neg Hx    Inflammatory bowel disease Neg Hx    Liver disease Neg Hx    Pancreatic cancer Neg Hx    Rectal cancer Neg Hx    I have reviewed his medical, social, and family history in detail and updated the electronic medical record as necessary.    PHYSICAL EXAMINATION  BP 110/62   Pulse 64   Ht '5\' 9"'  (1.753 m)   Wt 213 lb (96.6 kg)   BMI 31.45 kg/m  Wt Readings from Last 3 Encounters:  07/12/21 213 lb (96.6 kg)  05/25/21 222 lb 2 oz (100.8 kg)  03/09/21 214 lb (97.1 kg)   GEN: NAD, appears stated age, doesn't appear chronically ill PSYCH: Cooperative, without pressured speech EYE: Conjunctivae pink, sclerae anicteric ENT: MMM, without oral ulcers, no erythema or exudates noted NECK: Supple CV: RR without R/Gs  RESP: CTAB posteriorly, without wheezing GI: NABS, soft,  NT/ND, without rebound or guarding, no HSM appreciated GU: DRE shows MSK/EXT: _ edema, no palmar erythema SKIN: No jaundice, no spider angiomata, no concerning rashes NEURO:  Alert & Oriented x 3, no focal deficits, no evidence of asterixis   REVIEW OF DATA  I reviewed the following data at the time of this encounter:  GI Procedures and Studies  ***  Laboratory Studies  ***  Imaging Studies  ***   ASSESSMENT  Mr. Celmer is a 61 y.o. male with a pmh significant for The patient is seen today for evaluation and management of:  1. Alcoholic cirrhosis, unspecified whether ascites present ([place])   2. Anemia, unspecified type     ***   PLAN  1. Alcoholic cirrhosis, unspecified whether ascites present ([place]) *** - Ambulatory referral to Gastroenterology - CBC; Future - Comp Met (CMET); Future - IBC + Ferritin; Future - Reticulocytes; Future - B12; Future - Folate; Future -  INR/PT; Future  2. Anemia, unspecified type *** - Ambulatory referral to Gastroenterology - CBC; Future - Comp Met (CMET); Future - IBC + Ferritin; Future - Reticulocytes; Future - B12; Future - Folate; Future - INR/PT; Future    Orders Placed This Encounter  Procedures   CBC   Comp Met (CMET)   IBC + Ferritin   Reticulocytes   B12   Folate   INR/PT   Ambulatory referral to Gastroenterology    New Prescriptions   No medications on file   Modified Medications   No medications on file    Planned Follow Up No follow-ups on file.   Total Time in Face-to-Face and in Coordination of Care for patient including independent/personal interpretation/review of prior testing, medical history, examination, medication adjustment, communicating results with the patient directly, and documentation within the EHR is ***.   Justice Britain, MD [facility] Office # [phone]

## 2021-07-12 NOTE — Patient Instructions (Signed)
You have been scheduled for an endoscopy. Please follow written instructions given to you at your visit today. If you use inhalers (even only as needed), please bring them with you on the day of your procedure.  Your provider has requested that you go to the basement level for lab work before leaving today. Press "B" on the elevator. The lab is located at the first door on the left as you exit the elevator.  If you are age 61 or older, your body mass index should be between 23-30. Your Body mass index is 31.45 kg/m. If this is out of the aforementioned range listed, please consider follow up with your Primary Care Provider.  If you are age 49 or younger, your body mass index should be between 19-25. Your Body mass index is 31.45 kg/m. If this is out of the aformentioned range listed, please consider follow up with your Primary Care Provider.   ________________________________________________________  The [facility] providers would like to encourage you to use [facility] to communicate with providers for non-urgent requests or questions.  Due to long hold times on the telephone, sending your provider a message by [facility] may be a faster and more efficient way to get a response.  Please allow 48 business hours for a response.  Please remember that this is for non-urgent requests.  _______________________________________________________  Thank you for choosing me and [facility].  Dr. Rush Landmark

## 2021-07-14 ENCOUNTER — Ambulatory Visit: Payer: Medicare HMO | Admitting: Internal Medicine

## 2021-07-14 ENCOUNTER — Ambulatory Visit (INDEPENDENT_AMBULATORY_CARE_PROVIDER_SITE_OTHER): Payer: Medicare HMO | Admitting: Internal Medicine

## 2021-07-14 ENCOUNTER — Encounter: Payer: Self-pay | Admitting: Internal Medicine

## 2021-07-14 VITALS — BP 118/68 | HR 63 | Temp 98.5°F | Ht 69.25 in | Wt 211.0 lb

## 2021-07-14 DIAGNOSIS — J849 Interstitial pulmonary disease, unspecified: Secondary | ICD-10-CM | POA: Diagnosis not present

## 2021-07-14 DIAGNOSIS — F17210 Nicotine dependence, cigarettes, uncomplicated: Secondary | ICD-10-CM | POA: Diagnosis not present

## 2021-07-14 DIAGNOSIS — R69 Illness, unspecified: Secondary | ICD-10-CM | POA: Diagnosis not present

## 2021-07-14 LAB — PULMONARY FUNCTION TEST
DL/VA % pred: 92 %
DL/VA: 3.9 ml/min/mmHg/L
DLCO cor % pred: 88 %
DLCO cor: 23.72 ml/min/mmHg
DLCO unc % pred: 83 %
DLCO unc: 22.34 ml/min/mmHg
FEF 25-75 Pre: 2.24 L/sec
FEF2575-%Pred-Pre: 78 %
FEV1-%Pred-Pre: 91 %
FEV1-Pre: 3.17 L
FEV1FVC-%Pred-Pre: 99 %
FEV6-%Pred-Pre: 94 %
FEV6-Pre: 4.13 L
FEV6FVC-%Pred-Pre: 102 %
FVC-%Pred-Pre: 92 %
FVC-Pre: 4.25 L
Pre FEV1/FVC ratio: 75 %
Pre FEV6/FVC Ratio: 97 %

## 2021-07-14 NOTE — Patient Instructions (Signed)
Spirometry and DLCO Performed Today.  

## 2021-07-14 NOTE — Patient Instructions (Addendum)
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 [name] for a 30-minute face-to-face visit in ILD clinic or any day in 6 months after the test

## 2021-07-16 DIAGNOSIS — F1020 Alcohol dependence, uncomplicated: Secondary | ICD-10-CM | POA: Insufficient documentation

## 2021-07-16 DIAGNOSIS — D649 Anemia, unspecified: Secondary | ICD-10-CM | POA: Insufficient documentation

## 2021-07-16 DIAGNOSIS — Z86010 Personal history of colonic polyps: Secondary | ICD-10-CM | POA: Insufficient documentation

## 2021-07-16 DIAGNOSIS — K7030 Alcoholic cirrhosis of liver without ascites: Secondary | ICD-10-CM | POA: Insufficient documentation

## 2021-07-28 ENCOUNTER — Telehealth: Payer: Self-pay | Admitting: Gastroenterology

## 2021-07-28 NOTE — Telephone Encounter (Signed)
Robert Greene, You can let the patient know that I have reviewed his labs from our clinic visit yesterday. He does have a persisting anemia (albeit better than a few months ago) as well as iron deficiency that is a bit worse than a few months ago.  I recommend that he initiate oral iron (ferrous gluconate 324 mg versus ferrous sulfate 325 mg) daily.  I also recommend that we add a colonoscopy to his EGD if he is willing to have that done.  As of yesterday's discussion in clinic he was willing to consider that. Let me know what he decides. Thanks. GM

## 2021-07-28 NOTE — Telephone Encounter (Signed)
Left message on machine to call back  

## 2021-07-29 NOTE — Telephone Encounter (Signed)
I spoke with the pt and he has been advised of the lab results and agrees to the colon.  I have entered the order and sent the instructions to his My Chart.  He says his daughter will look at that and call if she has any questions  Amb referral sent to [facility].

## 2021-08-01 ENCOUNTER — Other Ambulatory Visit: Payer: Self-pay

## 2021-08-01 MED ORDER — METOCLOPRAMIDE HCL 5 MG PO TABS: 5.0000 mg | ORAL_TABLET | ORAL | 0 refills | Status: DC

## 2021-08-01 MED ORDER — POLYETHYLENE GLYCOL 3350 17 GM/SCOOP PO POWD: 1.0000 | Freq: Every day | ORAL | 3 refills | Status: DC

## 2021-08-03 ENCOUNTER — Ambulatory Visit (AMBULATORY_SURGERY_CENTER): Payer: Medicare HMO | Admitting: Gastroenterology

## 2021-08-03 ENCOUNTER — Encounter: Payer: Self-pay | Admitting: Gastroenterology

## 2021-08-03 VITALS — BP 124/67 | HR 53 | Temp 99.0°F | Resp 21 | Ht 69.0 in | Wt 211.0 lb

## 2021-08-03 DIAGNOSIS — K635 Polyp of colon: Secondary | ICD-10-CM | POA: Diagnosis not present

## 2021-08-03 DIAGNOSIS — D509 Iron deficiency anemia, unspecified: Secondary | ICD-10-CM | POA: Diagnosis not present

## 2021-08-03 DIAGNOSIS — K7031 Alcoholic cirrhosis of liver with ascites: Secondary | ICD-10-CM

## 2021-08-03 DIAGNOSIS — Z86010 Personal history of colonic polyps: Secondary | ICD-10-CM | POA: Diagnosis not present

## 2021-08-03 DIAGNOSIS — D649 Anemia, unspecified: Secondary | ICD-10-CM | POA: Diagnosis not present

## 2021-08-03 DIAGNOSIS — K2950 Unspecified chronic gastritis without bleeding: Secondary | ICD-10-CM | POA: Diagnosis not present

## 2021-08-03 DIAGNOSIS — K766 Portal hypertension: Secondary | ICD-10-CM

## 2021-08-03 DIAGNOSIS — K2970 Gastritis, unspecified, without bleeding: Secondary | ICD-10-CM | POA: Diagnosis not present

## 2021-08-03 DIAGNOSIS — K319 Disease of stomach and duodenum, unspecified: Secondary | ICD-10-CM

## 2021-08-03 DIAGNOSIS — K449 Diaphragmatic hernia without obstruction or gangrene: Secondary | ICD-10-CM | POA: Diagnosis not present

## 2021-08-03 DIAGNOSIS — D122 Benign neoplasm of ascending colon: Secondary | ICD-10-CM | POA: Diagnosis not present

## 2021-08-03 DIAGNOSIS — K648 Other hemorrhoids: Secondary | ICD-10-CM | POA: Diagnosis not present

## 2021-08-03 DIAGNOSIS — R69 Illness, unspecified: Secondary | ICD-10-CM | POA: Diagnosis not present

## 2021-08-03 DIAGNOSIS — K2980 Duodenitis without bleeding: Secondary | ICD-10-CM

## 2021-08-03 DIAGNOSIS — I8500 Esophageal varices without bleeding: Secondary | ICD-10-CM | POA: Diagnosis not present

## 2021-08-03 DIAGNOSIS — K3189 Other diseases of stomach and duodenum: Secondary | ICD-10-CM | POA: Diagnosis not present

## 2021-08-03 DIAGNOSIS — K2960 Other gastritis without bleeding: Secondary | ICD-10-CM

## 2021-08-03 DIAGNOSIS — I10 Essential (primary) hypertension: Secondary | ICD-10-CM | POA: Diagnosis not present

## 2021-08-03 MED ORDER — SODIUM CHLORIDE 0.9 % IV SOLN: 500.0000 mL | INTRAVENOUS | Status: DC

## 2021-08-03 MED ORDER — OMEPRAZOLE 20 MG PO CPDR: 20.0000 mg | DELAYED_RELEASE_CAPSULE | Freq: Two times a day (BID) | ORAL | 4 refills | Status: DC

## 2021-08-03 NOTE — Op Note (Signed)
Robert Greene Patient Name: Robert Greene Procedure Date: 08/03/2021 1:30 PM MRN: [id number] Endoscopist: Justice Britain , MD Age: 61 Referring MD:  Date of Birth: [birth year] Gender: Male Account #: [id number] Procedure:                Upper GI endoscopy Indications:              Iron deficiency anemia, Portal hypertension rule                            out esophageal varices Medicines:                Monitored Anesthesia Care Procedure:                Pre-Anesthesia Assessment:                           - Prior to the procedure, a History and Physical                            was performed, and patient medications and                            allergies were reviewed. The patient's tolerance of                            previous anesthesia was also reviewed. The risks                            and benefits of the procedure and the sedation                            options and risks were discussed with the patient.                            All questions were answered, and informed consent                            was obtained. Prior Anticoagulants: The patient has                            taken no previous anticoagulant or antiplatelet                            agents. ASA Grade Assessment: III - A patient with                            severe systemic disease. After reviewing the risks                            and benefits, the patient was deemed in                            satisfactory condition to undergo the procedure.  After obtaining informed consent, the endoscope was                            passed under direct vision. Throughout the                            procedure, the patient's blood pressure, pulse, and                            oxygen saturations were monitored continuously. The                            GIF [id number] #[id number] was introduced through the                            mouth, and advanced to the  second part of duodenum.                            The upper GI endoscopy was accomplished without                            difficulty. The patient tolerated the procedure. Scope In: Scope Out: Findings:                 A single 18 mm nodular area was found in the                            proximal esophagus, 19 cm from the incisors - query                            a gastric ectopic mucosa with some mild ulceration.                            Biopsies were taken with a cold forceps for                            histology to rule out dysplasia.                           No gross lesions were noted in the mid esophagus.                           Grade II varices were found in the distal esophagus.                           The Z-line was irregular and was found 41 cm from                            the incisors.                           A 1 cm hiatal hernia was present.  Moderate portal hypertensive gastropathy was found                            in the cardia, in the gastric fundus and in the                            gastric body.                           Patchy mild inflammation characterized by erosions,                            friability and granularity was found in the entire                            examined stomach. Biopsies were taken with a cold                            forceps for histology and Helicobacter pylori                            testing.                           Nodular mucosa was found in the duodenal bulb, in                            the first portion of the duodenum and in the second                            portion of the duodenum. Biopsies were taken with a                            cold forceps for histology. Biopsies were taken                            with a cold forceps for histology.                           A single 18 mm submucosal nodule was found in the                            second portion of the  duodenum - pillow test                            positive but larger than expected and more                            pedunculated in appearance. Complications:            No immediate complications. Estimated Blood Loss:     Estimated blood loss was minimal. Impression:               - Nodular mucosa - ?GEM found in the proximal  esophagus. Biopsied.                           - No gross lesions in esophagus in middle esophagus.                           - Grade II esophageal varices distally.                           - Z-line irregular, 41 cm from the incisors.                           - 1 cm hiatal hernia.                           - Portal hypertensive gastropathy.                           - Gastritis. Biopsied.                           - Nodular mucosa in the duodenal bulb and in the                            second portion of the duodenum. Biopsied.                           - Submucosal lesion found in D2 (not clearly a                            lipoma but not clearly a varix). Recommendation:           - Proceed to scheduled colonoscopy.                           - Continue present medications.                           - Recommend initiation of Nadolol or Propanolol as                            a NSBB or Carvedilol for primary prevention of                            Variceal bleeding. Will discuss with patient.                           - Increase to Omeprazole 20 mg twice daily (or can                            take both at once) for next few months while trying                            to heal gastric lining.                           -  Continue present medications.                           - Await pathology results.                           - Recommend, non-urgent EUS to evaluate the                            duodenal SEL to better define and ensure this is                            lipomatous and not variceal.                            - The findings and recommendations were discussed                            with the patient.                           - The findings and recommendations were discussed                            with the patient's family. Justice Britain, MD 08/03/2021 2:18:35 PM

## 2021-08-03 NOTE — Progress Notes (Signed)
Pt's states no medical or surgical changes since previsit or office visit. 

## 2021-08-03 NOTE — Progress Notes (Signed)
Called to room to assist during endoscopic procedure.  Patient ID and intended procedure confirmed with present staff. Received instructions for my participation in the procedure from the performing physician.  

## 2021-08-03 NOTE — Patient Instructions (Addendum)
Handouts provided on hiatal hernia, gastritis, polyps and hemorrhoids.   Recommend a high-fiber diet (see handout). Use FiberCon 1-2 tablets by mouth daily.   Increase to Omeprazole '20mg'$  twice daily (or can take both at once) for next few months while trying to heal gastric lining.   YOU HAD AN ENDOSCOPIC PROCEDURE TODAY AT [facility]:   Refer to the procedure report that was given to you for any specific questions about what was found during the examination.  If the procedure report does not answer your questions, please call your gastroenterologist to clarify.  If you requested that your care partner not be given the details of your procedure findings, then the procedure report has been included in a sealed envelope for you to review at your convenience later.  YOU SHOULD EXPECT: Some feelings of bloating in the abdomen. Passage of more gas than usual.  Walking can help get rid of the air that was put into your GI tract during the procedure and reduce the bloating. If you had a lower endoscopy (such as a colonoscopy or flexible sigmoidoscopy) you may notice spotting of blood in your stool or on the toilet paper. If you underwent a bowel prep for your procedure, you may not have a normal bowel movement for a few days.  Please Note:  You might notice some irritation and congestion in your nose or some drainage.  This is from the oxygen used during your procedure.  There is no need for concern and it should clear up in a day or so.  SYMPTOMS TO REPORT IMMEDIATELY:  Following lower endoscopy (colonoscopy or flexible sigmoidoscopy):  Excessive amounts of blood in the stool  Significant tenderness or worsening of abdominal pains  Swelling of the abdomen that is new, acute  Fever of 100F or higher  Following upper endoscopy (EGD)  Vomiting of blood or coffee ground material  New chest pain or pain under the shoulder blades  Painful or persistently difficult swallowing  New  shortness of breath  Fever of 100F or higher  Black, tarry-looking stools  For urgent or emergent issues, a gastroenterologist can be reached at any hour by calling [phone]. Do not use MyChart messaging for urgent concerns.    DIET:  We do recommend a small meal at first, but then you may proceed to your regular diet.  Drink plenty of fluids but you should avoid alcoholic beverages for 24 hours.  ACTIVITY:  You should plan to take it easy for the rest of today and you should NOT DRIVE or use heavy machinery until tomorrow (because of the sedation medicines used during the test).    FOLLOW UP: Our staff will call the number listed on your records the next business day following your procedure.  We will call around 7:15- 8:00 am to check on you and address any questions or concerns that you may have regarding the information given to you following your procedure. If we do not reach you, we will leave a message.  If you develop any symptoms (ie: fever, flu-like symptoms, shortness of breath, cough etc.) before then, please call [phone].  If you test positive for Covid 19 in the 2 weeks post procedure, please call and report this information to [place].    If any biopsies were taken you will be contacted by phone or by letter within the next 1-3 weeks.  Please call us at [phone] if you have not heard about the biopsies in 3 weeks.  SIGNATURES/CONFIDENTIALITY: You and/or your care partner have signed paperwork which will be entered into your electronic medical record.  These signatures attest to the fact that that the information above on your After Visit Summary has been reviewed and is understood.  Full responsibility of the confidentiality of this discharge information lies with you and/or your care-partner.

## 2021-08-03 NOTE — Op Note (Signed)
[facility] Patient Name: Robert Greene Procedure Date: 08/03/2021 1:29 PM MRN: [id number] Endoscopist: Justice Britain , MD Age: 61 Referring MD:  Date of Birth: [birth year] Gender: Male Account #: [id number] Procedure:                Colonoscopy Indications:              Surveillance: Personal history of adenomatous                            polyps on last colonoscopy 5 years ago, Incidental                            - Iron deficiency anemia Medicines:                Monitored Anesthesia Care Procedure:                Pre-Anesthesia Assessment:                           - Prior to the procedure, a History and Physical                            was performed, and patient medications and                            allergies were reviewed. The patient's tolerance of                            previous anesthesia was also reviewed. The risks                            and benefits of the procedure and the sedation                            options and risks were discussed with the patient.                            All questions were answered, and informed consent                            was obtained. Prior Anticoagulants: The patient has                            taken no previous anticoagulant or antiplatelet                            agents. ASA Grade Assessment: III - A patient with                            severe systemic disease. After reviewing the risks                            and benefits, the patient was deemed in  satisfactory condition to undergo the procedure.                           After obtaining informed consent, the colonoscope                            was passed under direct vision. Throughout the                            procedure, the patient's blood pressure, pulse, and                            oxygen saturations were monitored continuously. The                            CF [id number] #[id number] was  introduced through the anus                            and advanced to the the cecum, identified by                            appendiceal orifice and ileocecal valve. The                            colonoscopy was performed without difficulty. The                            patient tolerated the procedure. The quality of the                            bowel preparation was inadequate. The ileocecal                            valve, appendiceal orifice, and rectum were                            photographed. Scope In: 1:52:09 PM Scope Out: 2:04:09 PM Scope Withdrawal Time: 0 hours 6 minutes 52 seconds  Total Procedure Duration: 0 hours 12 minutes 0 seconds  Findings:                 The digital rectal exam findings include                            hemorrhoids. Pertinent negatives include no                            palpable rectal lesions.                           A 6 mm polyp was found in the ascending colon. The                            polyp was sessile. The polyp was removed with a  cold snare. Resection and retrieval were complete.                           Extensive amounts of semi-liquid stool was found in                            the entire colon, making visualization difficult.                            Lavage of the area was performed using copious                            amounts, resulting in incomplete clearance with                            continued poor visualization - it became apparaent                            that attempt at further cleaning would not be                            successful to even get the patient to a fair                            visualization.                           Non-bleeding non-thrombosed external and internal                            hemorrhoids were found during retroflexion, during                            perianal exam and during digital exam. The                            hemorrhoids  were Grade II (internal hemorrhoids                            that prolapse but reduce spontaneously). Complications:            No immediate complications. Estimated Blood Loss:     Estimated blood loss was minimal. Impression:               - Preparation of the colon was inadequate and after                            extensive lavage we still had poor visualization.                           - Hemorrhoids found on digital rectal exam.                           - One 6 mm polyp in the ascending colon, removed  with a cold snare. Resected and retrieved.                           - Non-bleeding non-thrombosed external and internal                            hemorrhoids. Recommendation:           - The patient will be observed post-procedure,                            until all discharge criteria are met.                           - Discharge patient to home.                           - Patient has a contact number available for                            emergencies. The signs and symptoms of potential                            delayed complications were discussed with the                            patient. Return to normal activities tomorrow.                            Written discharge instructions were provided to the                            patient.                           - High fiber diet.                           - Use FiberCon 1-2 tablets PO daily.                           - Continue present medications.                           - Await pathology results.                           - Repeat colonoscopy for surveillance of polyps                            with true split-dose laxative therapy with 1 week                            of Miralax twice daily vs 2-day preparation (he                            took all  his prep last night).                           - The findings and recommendations were discussed                            with  the patient.                           - The findings and recommendations were discussed                            with the patient's family. Justice Britain, MD 08/03/2021 2:22:47 PM

## 2021-08-03 NOTE — Progress Notes (Signed)
GASTROENTEROLOGY PROCEDURE H&P NOTE   Primary Care Physician: Collene Leyden, MD  HPI: Robert Greene is a 61 y.o. male who presents for EGD/colonoscopy for evaluation of portal hypertension screening (varices/PHG), iron deficiency, colon polyp surveillance (history of TA's).  Past Medical History:  Diagnosis Date   Alcoholism ([place])    Anxiety    Arthritis    rheumatoid   Coronary artery calcification seen on CAT scan 04/08/2018   Dyspnea    HTN (hypertension) 04/08/2018   Hypercholesteremia    Hypertension    Interstitial lung disease ([place])    Kidney stones 2020   Pulmonary fibrosis ([facility])    Past Surgical History:  Procedure Laterality Date   KNEE CARTILAGE SURGERY Left    LUNG BIOPSY Right 12/06/2017   Procedure: LUNG BIOPSY;  Surgeon: Melrose Nakayama, MD;  Location: [facility];  Service: Thoracic;  Laterality: Right;   VIDEO ASSISTED THORACOSCOPY Right 12/06/2017   Procedure: VIDEO ASSISTED THORACOSCOPY;  Surgeon: Melrose Nakayama, MD;  Location: [facility];  Service: Thoracic;  Laterality: Right;   Current Outpatient Medications  Medication Sig Dispense Refill   amLODipine (NORVASC) 5 MG tablet TAKE 1 TABLET BY MOUTH EVERY DAY 90 tablet 0   atorvastatin (LIPITOR) 80 MG tablet Take 80 mg by mouth daily.  1   diphenhydrAMINE (BENADRYL) 25 MG tablet Take 25 mg by mouth daily as needed for itching.      EPINEPHrine 0.3 mg/0.3 mL IJ SOAJ injection Use once.  If ineffective, use 2nd dose. 2 Device 0   etanercept (ENBREL) 50 MG/ML injection Inject 50 mg into the skin once a week.     hydrochlorothiazide (HYDRODIURIL) 12.5 MG tablet TAKE 1 TABLET BY MOUTH EVERY DAY 90 tablet 1   losartan (COZAAR) 100 MG tablet TAKE 1 TABLET BY MOUTH EVERY DAY 90 tablet 1   metoCLOPramide (REGLAN) 5 MG tablet Take 1 tablet (5 mg total) by mouth as directed. 2 tablet 0   Omega-3 Fatty Acids (FISH OIL PO) Take 2 capsules by mouth 2 (two) times daily.      omeprazole (PRILOSEC) 20 MG capsule  Take 20 mg by mouth daily.     polyethylene glycol powder (GLYCOLAX/MIRALAX) 17 GM/SCOOP powder Take 255 g by mouth daily. 255 g 3   Current Facility-Administered Medications  Medication Dose Route Frequency Provider Last Rate Last Admin   0.9 %  sodium chloride infusion  500 mL Intravenous Continuous Mansouraty, Telford Nab., MD        Current Outpatient Medications:    amLODipine (NORVASC) 5 MG tablet, TAKE 1 TABLET BY MOUTH EVERY DAY, Disp: 90 tablet, Rfl: 0   atorvastatin (LIPITOR) 80 MG tablet, Take 80 mg by mouth daily., Disp: , Rfl: 1   diphenhydrAMINE (BENADRYL) 25 MG tablet, Take 25 mg by mouth daily as needed for itching. , Disp: , Rfl:    EPINEPHrine 0.3 mg/0.3 mL IJ SOAJ injection, Use once.  If ineffective, use 2nd dose., Disp: 2 Device, Rfl: 0   etanercept (ENBREL) 50 MG/ML injection, Inject 50 mg into the skin once a week., Disp: , Rfl:    hydrochlorothiazide (HYDRODIURIL) 12.5 MG tablet, TAKE 1 TABLET BY MOUTH EVERY DAY, Disp: 90 tablet, Rfl: 1   losartan (COZAAR) 100 MG tablet, TAKE 1 TABLET BY MOUTH EVERY DAY, Disp: 90 tablet, Rfl: 1   metoCLOPramide (REGLAN) 5 MG tablet, Take 1 tablet (5 mg total) by mouth as directed., Disp: 2 tablet, Rfl: 0   Omega-3 Fatty Acids (FISH OIL  PO), Take 2 capsules by mouth 2 (two) times daily. , Disp: , Rfl:    omeprazole (PRILOSEC) 20 MG capsule, Take 20 mg by mouth daily., Disp: , Rfl:    polyethylene glycol powder (GLYCOLAX/MIRALAX) 17 GM/SCOOP powder, Take 255 g by mouth daily., Disp: 255 g, Rfl: 3  Current Facility-Administered Medications:    0.9 %  sodium chloride infusion, 500 mL, Intravenous, Continuous, Mansouraty, Telford Nab., MD Allergies  Allergen Reactions   Bee Venom Anaphylaxis, Hives and Rash   Spider Antivenin [S Black Widow (Oceanographer) Antivenin] Anaphylaxis, Hives and Rash   Family History  Problem Relation Age of Onset   Cirrhosis Father    Colon cancer Neg Hx    Stomach cancer Neg Hx    Esophageal cancer  Neg Hx    Inflammatory bowel disease Neg Hx    Liver disease Neg Hx    Pancreatic cancer Neg Hx    Rectal cancer Neg Hx    Social History   Socioeconomic History   Marital status: Married    Spouse name: Not on file   Number of children: 2   Years of education: Not on file   Highest education level: Not on file  Occupational History   Not on file  Tobacco Use   Smoking status: Every Day    Packs/day: 1.50    Years: 40.00    Total pack years: 60.00    Types: Cigarettes   Smokeless tobacco: Never   Tobacco comments:    Currently smoking 3/4ppd as of 07/14/21 ep  Vaping Use   Vaping Use: Every day  Substance and Sexual Activity   Alcohol use: Yes    Alcohol/week: 1.0 standard drink of alcohol    Types: 1 Standard drinks or equivalent per week    Comment: daily, about 3 to 5 a day   Drug use: No   Sexual activity: Yes    Birth control/protection: None  Other Topics Concern   Not on file  Social History Narrative   Not on file   Social Determinants of Health   Financial Resource Strain: Not on file  Food Insecurity: Not on file  Transportation Needs: Not on file  Physical Activity: Not on file  Stress: Not on file  Social Connections: Not on file  Intimate Partner Violence: Not on file    Physical Exam: Today's Vitals   08/03/21 1250  BP: (!) 111/54  Pulse: (!) 59  Temp: 99 F (37.2 C)  SpO2: 98%  Weight: 211 lb (95.7 kg)  Height: '5\' 9"'$  (1.753 m)   Body mass index is 31.16 kg/m. GEN: NAD EYE: Sclerae anicteric ENT: MMM CV: Non-tachycardic GI: Soft, NT/ND NEURO:  Alert & Oriented x 3  Lab Results: No results for input(s): "WBC", "HGB", "HCT", "PLT" in the last 72 hours. BMET No results for input(s): "NA", "K", "CL", "CO2", "GLUCOSE", "BUN", "CREATININE", "CALCIUM" in the last 72 hours. LFT No results for input(s): "PROT", "ALBUMIN", "AST", "ALT", "ALKPHOS", "BILITOT", "BILIDIR", "IBILI" in the last 72 hours. PT/INR No results for input(s):  "LABPROT", "INR" in the last 72 hours.   Impression / Plan: This is a 61 y.o.male who presents for EGD/colonoscopy for evaluation of portal hypertension screening (varices/PHG), iron deficiency, colon polyp surveillance (history of TA's).  The risks and benefits of endoscopic evaluation/treatment were discussed with the patient and/or family; these include but are not limited to the risk of perforation, infection, bleeding, missed lesions, lack of diagnosis, severe illness requiring hospitalization, as well  as anesthesia and sedation related illnesses.  The patient's history has been reviewed, patient examined, no change in status, and deemed stable for procedure.  The patient and/or family is agreeable to proceed.    Justice Britain, MD [facility] Advanced Endoscopy Office # [phone]

## 2021-08-03 NOTE — Progress Notes (Signed)
Pt awake, report to RN, VVS  °

## 2021-08-04 ENCOUNTER — Telehealth: Payer: Self-pay | Admitting: *Deleted

## 2021-08-04 NOTE — Telephone Encounter (Signed)
First follow call attempt.  LVM to call if any questions or concerns.

## 2021-08-08 ENCOUNTER — Encounter: Payer: Self-pay | Admitting: Gastroenterology

## 2021-09-07 ENCOUNTER — Encounter: Payer: Self-pay | Admitting: Nurse Practitioner

## 2021-09-07 ENCOUNTER — Ambulatory Visit: Payer: Medicare HMO | Admitting: Nurse Practitioner

## 2021-09-07 VITALS — BP 118/64 | HR 64 | Ht 70.0 in | Wt 207.0 lb

## 2021-09-07 DIAGNOSIS — Z86010 Personal history of colonic polyps: Secondary | ICD-10-CM

## 2021-09-07 DIAGNOSIS — K7030 Alcoholic cirrhosis of liver without ascites: Secondary | ICD-10-CM | POA: Diagnosis not present

## 2021-09-07 DIAGNOSIS — D509 Iron deficiency anemia, unspecified: Secondary | ICD-10-CM | POA: Diagnosis not present

## 2021-09-07 DIAGNOSIS — R69 Illness, unspecified: Secondary | ICD-10-CM | POA: Diagnosis not present

## 2021-09-07 NOTE — Patient Instructions (Signed)
We will call you to schedule your procedures.   Please stop all alcohol.  The [facility] providers would like to encourage you to use [facility] to communicate with providers for non-urgent requests or questions.  Due to long hold times on the telephone, sending your provider a message by [facility] may be a faster and more efficient way to get a response.  Please allow 48 business hours for a response.  Please remember that this is for non-urgent requests.

## 2021-09-07 NOTE — Progress Notes (Signed)
Chief Complaint:  needs repeat colonoscopy due poor prep    Assessment &  Plan   # 61 yo male with iron deficiency anemia / history of colon polyps. Inadequate bowel prep on colonoscopy last month. He is here to get rescheduled for a colonoscopy  From what I can tell from Dr. Donneta Romberg notes he is planning on doing an EUS for evaluation of a duodenal nodule. If so then perhaps colonoscopy can be scheduled at same time as EUS? Will need to let patient know about scheduling of procedure.  For colonoscopy he should get a two day bowel prep.  Hold iron for 10 days prior to colonoscopy ( though he isn't taking it consistently).   # Etoh abuse / probable cirrhosis with portal HTN manifested by grade II esophageal varices / portal hypertensive gastropathy on recent EGD.  We discussed starting a non-selective beta blocker as mentioned but not started at time of recent EGD. I explained the purpose of the medication. I explained that he would need to monitor his heart rate and BP should the medication be started. I will discuss with Dr. Rush Landmark to see if he had any other concerns about starting a non-selective beta blocker.  Reiterated the need to avoid Etoh. We specifically discussed potential for worsening portal HTN.    HPI   Robert Greene is a 61 y.o. male known to Dr. Rush Landmark with a past medical history significant for alcohol abuse, cirrhosis, adenomatous colon polyps GERD, HTN, HLD, pulmonary fibrosis, rheumatoid arthritis.  See PMH /PSH for additional history  I am not sure why Robert Greene is here today. He says he is here to get scheduled for repeat colonoscopy.  He had a colonoscopy in July but the prep was not adequate.  Also tells me he is suppose to start a new medication?   He was just seen him in the office by Dr. Rush Landmark late June at which time he was scheduled for colonoscopy and upper endoscopy to evaluate iron deficiency anemia, history of colon polyps as well as also to  screen for esophageal varices. Grade II esophageal varices found. A submucosal lesion found in D2 (not clearly a lipoma but not clearly a varix). Colonoscopy prep was not adequate, repeat exam recommended. Post EGD plan was to possible start a non-selective beta blocker but Dr. Rush Landmark appearing decided to hold off.  For evaluation of duodenal lesion he was to have a non-urgent EUS but this apparently hasn't been scheduled yet.   Interval History:  [place] is here with his daughter. He is still drinking about 1-2 beers a day but says he is about to quit. He feels okay. Currently no edema, no increased abdominal girth. No confusion at home per daughter.   Previous GI Evaluation   08/03/21 EGD  -Nodular mucosa - ?GEM found in the proximal esophagus. Biopsied. - No gross lesions in esophagus in middle esophagus - Grade II esophageal varices distally. - Z-line irregular, 41 cm from the incisors. - 1 cm hiatal hernia. - Portal hypertensive gastropathy. - Gastritis. Biopsied. - Nodular mucosa in the duodenal bulb and in the second portion of the duodenum. Biopsied. - Submucosal lesion found in D2 (not clearly a lipoma but not clearly a varix).  08/03/21 Colonoscopy  - Preparation of the colon was inadequate and after extensive lavage we still had poor visualization. - Hemorrhoids found on digital rectal exam. - One 6 mm polyp in the ascending colon, removed with a cold snare. Resected and retrieved. -  Non-bleeding non-thrombosed external and internal hemorrhoids.  Imaging   Labs:     Latest Ref Rng & Units 07/12/2021   10:57 AM 05/25/2021    9:32 AM 03/09/2021   11:31 AM  CBC  WBC 4.0 - 10.5 K/uL 6.5  5.7  8.0   Hemoglobin 13.0 - 17.0 g/dL 12.7  12.2  12.4   Hematocrit 39.0 - 52.0 % 39.4  36.7  38.3   Platelets 150.0 - 400.0 K/uL 118.0  99.0  114.0        Latest Ref Rng & Units 07/12/2021   10:57 AM 05/25/2021    9:32 AM 03/09/2021   11:31 AM  Hepatic Function  Total Protein 6.0 - 8.3  g/dL 7.9  7.7  8.7    8.7   Albumin 3.5 - 5.2 g/dL 3.9  3.9  3.8    3.8   AST 0 - 37 U/L 39  36  43    43   ALT 0 - 53 U/L 35  '22  29    29   ' Alk Phosphatase 39 - 117 U/L 117  121  101    101   Total Bilirubin 0.2 - 1.2 mg/dL 1.3  1.4  1.4    1.4   Bilirubin, Direct 0.0 - 0.3 mg/dL   0.4      Past Medical History:  Diagnosis Date   Alcoholism ([place])    Anxiety    Arthritis    rheumatoid   Coronary artery calcification seen on CAT scan 04/08/2018   Dyspnea    HTN (hypertension) 04/08/2018   Hypercholesteremia    Hypertension    Interstitial lung disease ([place])    Kidney stones 2020   Pulmonary fibrosis ([place])     Past Surgical History:  Procedure Laterality Date   KNEE CARTILAGE SURGERY Left    LUNG BIOPSY Right 12/06/2017   Procedure: LUNG BIOPSY;  Surgeon: Melrose Nakayama, MD;  Location: [facility];  Service: Thoracic;  Laterality: Right;   VIDEO ASSISTED THORACOSCOPY Right 12/06/2017   Procedure: VIDEO ASSISTED THORACOSCOPY;  Surgeon: Melrose Nakayama, MD;  Location: [facility] OR;  Service: Thoracic;  Laterality: Right;    Current Medications, Allergies, Family History and Social History were reviewed in [facility] record.     Current Outpatient Medications  Medication Sig Dispense Refill   amLODipine (NORVASC) 5 MG tablet TAKE 1 TABLET BY MOUTH EVERY DAY 90 tablet 0   atorvastatin (LIPITOR) 80 MG tablet Take 80 mg by mouth daily.  1   diphenhydrAMINE (BENADRYL) 25 MG tablet Take 25 mg by mouth daily as needed for itching.      EPINEPHrine 0.3 mg/0.3 mL IJ SOAJ injection Use once.  If ineffective, use 2nd dose. 2 Device 0   etanercept (ENBREL) 50 MG/ML injection Inject 50 mg into the skin once a week.     hydrochlorothiazide (HYDRODIURIL) 12.5 MG tablet TAKE 1 TABLET BY MOUTH EVERY DAY 90 tablet 1   losartan (COZAAR) 100 MG tablet TAKE 1 TABLET BY MOUTH EVERY DAY 90 tablet 1   Omega-3 Fatty Acids (FISH OIL PO) Take 2 capsules by mouth 2 (two)  times daily.      omeprazole (PRILOSEC) 20 MG capsule Take 20 mg by mouth daily.     No current facility-administered medications for this visit.    Review of Systems: No chest pain. No shortness of breath. No urinary complaints.    Physical Exam  Wt Readings from Last 3 Encounters:  09/07/21 207 lb (93.9 kg)  08/03/21 211 lb (95.7 kg)  07/14/21 211 lb (95.7 kg)    BP 118/64   Pulse 64   Ht '5\' 10"'  (1.778 m)   Wt 207 lb (93.9 kg)   BMI 29.70 kg/m  Constitutional:  Generally well appearing male in no acute distress. Psychiatric: Pleasant. Normal mood and affect. Behavior is normal. EENT: Pupils normal.  Conjunctivae are normal. No scleral icterus. Neck supple.  Cardiovascular: Normal rate, regular rhythm. No edema Pulmonary/chest: Effort normal and breath sounds normal. No wheezing, rales or rhonchi. Abdominal: Soft, nondistended, nontender. Bowel sounds active throughout. There are no masses palpable. No hepatomegaly. Neurological: Alert and oriented to person place and time. No asterixis Skin: Skin is warm and dry. No rashes noted.  Tye Savoy, NP  09/07/2021, 9:27 AM

## 2021-09-13 ENCOUNTER — Telehealth: Payer: Self-pay

## 2021-09-13 MED ORDER — NADOLOL 20 MG PO TABS: 20.0000 mg | ORAL_TABLET | Freq: Every day | ORAL | 6 refills | Status: DC

## 2021-09-13 NOTE — Telephone Encounter (Signed)
Author: Mansouraty, Telford Nab., MD Service: Gastroenterology Author Type: Physician  Filed: 09/13/2021  3:55 AM Encounter Date: 09/07/2021 Status: Signed  Editor: Mansouraty, Telford Nab., MD (Physician)                                     Attending 62 Attestation    I have reviewed the chart.    I agree with the Advanced Practitioner's note, impression, and recommendations with any updates as below. Happy to hear that he is trying to cut down on his alcohol consumption.  That will have the greatest impact for the patient in the long-term.  It seems the patient is willing, would recommend that we initiate nadolol 20 mg once daily in an effort of seeing if he can tolerate that medication and dosing.  We can pursue EUS/colonoscopy at the same time and that can be performed at any point within the next 6-12 months (as hospital-based procedures).  Agree with need for 2-day preparation for colonoscopy. Tawonna Esquer, please work with the patient and find out when he is willing to move forward with his procedures being scheduled.  Also go ahead and initiate him on nadolol and set up a follow-up with NP Chester Holstein or myself in 4 to 6 weeks to see how he is done with nadolol therapy.  He should begin a diary and take his heart rate periodically few times a week to ensure that his heart rate is not getting too low or he is having to low blood pressure.     Justice Britain, MD [facility] Advanced Endoscopy Office # [phone]

## 2021-09-13 NOTE — Telephone Encounter (Signed)
Nadolol has been sent to the pharmacy EUS Colon recall entered to be done in 9 months with 2 day prep Follow up made with Dr Rush Landmark on 11/08/21 at 2:50 pm Pt to be made aware to keep a diary of HR a few times a week to ensure HR and BP not dropping too low.   Left message on machine to call back

## 2021-09-13 NOTE — Telephone Encounter (Signed)
-----   Message from Irving Copas., MD sent at 09/13/2021  3:53 AM EDT -----    ----- Message ----- From: Willia Craze, NP Sent: 09/07/2021   1:36 PM EDT To: Irving Copas., MD

## 2021-09-13 NOTE — Telephone Encounter (Signed)
Patient returned your phone call.

## 2021-09-13 NOTE — Progress Notes (Signed)
Attending Physician's Attestation   I have reviewed the chart.   I agree with the Advanced Practitioner's note, impression, and recommendations with any updates as below. Happy to hear that he is trying to cut down on his alcohol consumption.  That will have the greatest impact for the patient in the long-term.  It seems the patient is willing, would recommend that we initiate nadolol 20 mg once daily in an effort of seeing if he can tolerate that medication and dosing.  We can pursue EUS/colonoscopy at the same time and that can be performed at any point within the next 6-12 months (as hospital-based procedures).  Agree with need for 2-day preparation for colonoscopy. Patty, please work with the patient and find out when he is willing to move forward with his procedures being scheduled.  Also go ahead and initiate him on nadolol and set up a follow-up with NP Chester Holstein or myself in 4 to 6 weeks to see how he is done with nadolol therapy.  He should begin a diary and take his heart rate periodically few times a week to ensure that his heart rate is not getting too low or he is having to low blood pressure.   Justice Britain, MD [facility] Advanced Endoscopy Office # [phone]

## 2021-09-14 NOTE — Telephone Encounter (Signed)
The pt has been advised of the prescription and the need to keep the diary of HR/BP a few times a week. He will notify us of any changes.  He was given the appt for follow up with Dr Rush Landmark.  Recall entered

## 2021-10-10 DIAGNOSIS — R69 Illness, unspecified: Secondary | ICD-10-CM | POA: Diagnosis not present

## 2021-10-10 DIAGNOSIS — J449 Chronic obstructive pulmonary disease, unspecified: Secondary | ICD-10-CM | POA: Diagnosis not present

## 2021-10-10 DIAGNOSIS — I10 Essential (primary) hypertension: Secondary | ICD-10-CM | POA: Diagnosis not present

## 2021-10-10 DIAGNOSIS — K7460 Unspecified cirrhosis of liver: Secondary | ICD-10-CM | POA: Diagnosis not present

## 2021-10-10 DIAGNOSIS — R7309 Other abnormal glucose: Secondary | ICD-10-CM | POA: Diagnosis not present

## 2021-10-10 DIAGNOSIS — K760 Fatty (change of) liver, not elsewhere classified: Secondary | ICD-10-CM | POA: Diagnosis not present

## 2021-10-10 DIAGNOSIS — I779 Disorder of arteries and arterioles, unspecified: Secondary | ICD-10-CM | POA: Diagnosis not present

## 2021-10-10 DIAGNOSIS — R748 Abnormal levels of other serum enzymes: Secondary | ICD-10-CM | POA: Diagnosis not present

## 2021-10-10 DIAGNOSIS — E7800 Pure hypercholesterolemia, unspecified: Secondary | ICD-10-CM | POA: Diagnosis not present

## 2021-10-10 DIAGNOSIS — J849 Interstitial pulmonary disease, unspecified: Secondary | ICD-10-CM | POA: Diagnosis not present

## 2021-10-10 DIAGNOSIS — M069 Rheumatoid arthritis, unspecified: Secondary | ICD-10-CM | POA: Diagnosis not present

## 2021-10-10 DIAGNOSIS — Z23 Encounter for immunization: Secondary | ICD-10-CM | POA: Diagnosis not present

## 2021-10-25 ENCOUNTER — Telehealth: Payer: Self-pay | Admitting: Acute Care

## 2021-10-25 NOTE — Telephone Encounter (Signed)
He he should only get 1 CT scan of the chest.  He should get a high-resolution CT chest sometime after mid November 2023 and this can be in December 2023 also but needs to be before he sees me.  He does not need low-dose CT scan chest

## 2021-10-25 NOTE — Telephone Encounter (Signed)
Dr [name], Please advise which CT you would prefer patient to have. HRCT is scheduled for 01/05/22 and lung screening CT is due 11/2021.

## 2021-10-26 NOTE — Telephone Encounter (Signed)
Noted  

## 2021-10-27 ENCOUNTER — Ambulatory Visit
Admission: EM | Admit: 2021-10-27 | Discharge: 2021-10-27 | Disposition: A | Discharge status: Home / Self Care | Payer: Medicare HMO | Attending: Physician Assistant | Admitting: Physician Assistant

## 2021-10-27 DIAGNOSIS — W19XXXA Unspecified fall, initial encounter: Secondary | ICD-10-CM

## 2021-10-27 DIAGNOSIS — M545 Low back pain, unspecified: Secondary | ICD-10-CM | POA: Diagnosis not present

## 2021-10-27 MED ORDER — CYCLOBENZAPRINE HCL 10 MG PO TABS: 10.0000 mg | ORAL_TABLET | Freq: Two times a day (BID) | ORAL | 0 refills | Status: DC | PRN

## 2021-10-27 MED ORDER — PREDNISONE 20 MG PO TABS: 40.0000 mg | ORAL_TABLET | Freq: Every day | ORAL | 0 refills | Status: AC

## 2021-10-27 NOTE — ED Provider Notes (Signed)
[facility]    CSN: [id number] Arrival date & time: 10/27/21  4854      History   Chief Complaint Chief Complaint  Patient presents with   Fall   Back Pain    HPI Robert Greene is a 61 y.o. male.   Patient here today for evaluation of right low back pain that occurred after a fall 2 days ago.  He reports that he was walking up a hill and lost his footing and fell backwards hitting a rock with his right.  He has tried over-the-counter medication without significant relief.  He denies any numbness or tingling.  He has not had any blood in his stool or dark tarry stools.  He denies any blood in his urine or trouble with urination.  He denies any pain to the middle of his back. He denies any loss of bowel or bladder function  The history is provided by the patient.    Past Medical History:  Diagnosis Date   Alcoholism ([place])    Anxiety    Arthritis    rheumatoid   Coronary artery calcification seen on CAT scan 04/08/2018   Dyspnea    HTN (hypertension) 04/08/2018   Hypercholesteremia    Hypertension    Interstitial lung disease ([place])    Kidney stones 2020   Pulmonary fibrosis [facility])     Patient Active Problem List   Diagnosis Date Noted   Alcoholic cirrhosis of liver without ascites ([place]) 07/16/2021   Anemia 07/16/2021   History of colon polyps 07/16/2021   Alcohol use disorder, severe, dependence ([place]) 07/16/2021   Atherosclerosis of both carotid arteries 07/07/2019   Essential hypertension 04/08/2018   Coronary artery calcification seen on CAT scan 04/08/2018   Laboratory examination 04/08/2018   Mixed hyperlipidemia 04/08/2018   ILD (interstitial lung disease) ([place]) 12/06/2017    Past Surgical History:  Procedure Laterality Date   KNEE CARTILAGE SURGERY Left    LUNG BIOPSY Right 12/06/2017   Procedure: LUNG BIOPSY;  Surgeon: Melrose Nakayama, MD;  Location: [place];  Service: Thoracic;  Laterality: Right;   VIDEO ASSISTED THORACOSCOPY Right  12/06/2017   Procedure: VIDEO ASSISTED THORACOSCOPY;  Surgeon: Melrose Nakayama, MD;  [facility];  Service: Thoracic;  Laterality: Right;       Home Medications    Prior to Admission medications   Medication Sig Start Date End Date Taking? Authorizing Provider  cyclobenzaprine (FLEXERIL) 10 MG tablet Take 1 tablet (10 mg total) by mouth 2 (two) times daily as needed for muscle spasms. 10/27/21  Yes Francene Finders, PA-C  predniSONE (DELTASONE) 20 MG tablet Take 2 tablets (40 mg total) by mouth daily with breakfast for 5 days. 10/27/21 11/01/21 Yes Francene Finders, PA-C  amLODipine (NORVASC) 5 MG tablet TAKE 1 TABLET BY MOUTH EVERY DAY 04/04/21   Tolia, Sunit, DO  atorvastatin (LIPITOR) 80 MG tablet Take 80 mg by mouth daily. 10/18/16   [provider]  diphenhydrAMINE (BENADRYL) 25 MG tablet Take 25 mg by mouth daily as needed for itching.     [provider]  EPINEPHrine 0.3 mg/0.3 mL IJ SOAJ injection Use once.  If ineffective, use 2nd dose. 11/19/16   Isla Pence, MD  etanercept (ENBREL) 50 MG/ML injection Inject 50 mg into the skin once a week.    [provider]  hydrochlorothiazide (HYDRODIURIL) 12.5 MG tablet TAKE 1 TABLET BY MOUTH EVERY DAY 05/02/21   Cantwell, Celeste C, PA-C  losartan (COZAAR) 100  MG tablet TAKE 1 TABLET BY MOUTH EVERY DAY 09/08/20   Tolia, Sunit, DO  nadolol (CORGARD) 20 MG tablet Take 1 tablet (20 mg total) by mouth daily. 09/13/21   Mansouraty, Telford Nab., MD  Omega-3 Fatty Acids (FISH OIL PO) Take 2 capsules by mouth 2 (two) times daily.     [provider]  omeprazole (PRILOSEC) 20 MG capsule Take 20 mg by mouth daily.    [provider]    Family History Family History  Problem Relation Age of Onset   Cirrhosis Father    Colon cancer Neg Hx    Stomach cancer Neg Hx    Esophageal cancer Neg Hx    Inflammatory bowel disease Neg Hx    Liver disease Neg Hx    Pancreatic cancer Neg Hx    Rectal  cancer Neg Hx     Social History Social History   Tobacco Use   Smoking status: Every Day    Packs/day: 1.50    Years: 40.00    Total pack years: 60.00    Types: Cigarettes   Smokeless tobacco: Never   Tobacco comments:    Currently smoking 3/4ppd as of 07/14/21 ep  Vaping Use   Vaping Use: Every day  Substance Use Topics   Alcohol use: Yes    Alcohol/week: 1.0 standard drink of alcohol    Types: 1 Standard drinks or equivalent per week    Comment: daily, about 3 to 5 a day   Drug use: No     Allergies   Bee venom and Spider antivenin [s black widow (latrodec mactans) antivenin]   Review of Systems Review of Systems  Constitutional:  Negative for chills and fever.  Eyes:  Negative for discharge and redness.  Respiratory:  Negative for shortness of breath.   Gastrointestinal:  Negative for blood in stool, nausea and vomiting.  Genitourinary:  Negative for difficulty urinating and hematuria.  Musculoskeletal:  Positive for back pain and myalgias.  Neurological:  Negative for weakness and numbness.     Physical Exam Triage Vital Signs ED Triage Vitals  Enc Vitals Group     BP      Pulse      Resp      Temp      Temp src      SpO2      Weight      Height      Head Circumference      Peak Flow      Pain Score      Pain Loc      Pain Edu?      Excl. in [place]?    No data found.  Updated Vital Signs BP 123/79 (BP Location: Left Arm)   Pulse 60   Temp 97.7 F (36.5 C) (Oral)   Resp 16   SpO2 96%      Physical Exam Vitals and nursing note reviewed.  Constitutional:      General: He is not in acute distress.    Appearance: Normal appearance. He is not ill-appearing.  HENT:     Head: Normocephalic and atraumatic.  Eyes:     Conjunctiva/sclera: Conjunctivae normal.  Cardiovascular:     Rate and Rhythm: Normal rate.  Pulmonary:     Effort: Pulmonary effort is normal.  Musculoskeletal:     Comments: No TTP to midline thoracic or lumbar spine-  no  step-offs, deformity noted, mild TTP to right low back with mild swelling noted to musculature  of right low back  Neurological:     Mental Status: He is alert.  Psychiatric:        Mood and Affect: Mood normal.        Behavior: Behavior normal.        Thought Content: Thought content normal.      UC Treatments / Results  Labs (all labs ordered are listed, but only abnormal results are displayed) Labs Reviewed - No data to display  EKG   Radiology No results found.  Procedures Procedures (including critical care time)  Medications Ordered in UC Medications - No data to display  Initial Impression / Assessment and Plan / UC Course  I have reviewed the triage vital signs and the nursing notes.  Pertinent labs & imaging results that were available during my care of the patient were reviewed by me and considered in my medical decision making (see chart for details).   We will treat to cover muscle pain and injury with steroid burst and muscle relaxer.  Advised that muscle relaxer may cause drowsiness.  Encouraged follow-up if no gradual improvement or with any further concerns.  Final Clinical Impressions(s) / UC Diagnoses   Final diagnoses:  Fall, initial encounter  Acute right-sided low back pain without sciatica   Discharge Instructions   None    ED Prescriptions     Medication Sig Dispense Auth. Provider   predniSONE (DELTASONE) 20 MG tablet Take 2 tablets (40 mg total) by mouth daily with breakfast for 5 days. 10 tablet Ewell Poe F, PA-C   cyclobenzaprine (FLEXERIL) 10 MG tablet Take 1 tablet (10 mg total) by mouth 2 (two) times daily as needed for muscle spasms. 20 tablet Francene Finders, PA-C      PDMP not reviewed this encounter.   Francene Finders, PA-C 10/27/21 1200

## 2021-10-27 NOTE — ED Triage Notes (Signed)
Pt presents with right lower back after a fall 2 nights ago.

## 2021-11-08 ENCOUNTER — Encounter: Payer: Self-pay | Admitting: Gastroenterology

## 2021-11-08 ENCOUNTER — Ambulatory Visit: Payer: Medicare HMO | Admitting: Gastroenterology

## 2021-11-08 ENCOUNTER — Other Ambulatory Visit (INDEPENDENT_AMBULATORY_CARE_PROVIDER_SITE_OTHER): Payer: Medicare HMO

## 2021-11-08 VITALS — BP 136/72 | HR 53 | Ht 70.0 in | Wt 210.1 lb

## 2021-11-08 DIAGNOSIS — K7030 Alcoholic cirrhosis of liver without ascites: Secondary | ICD-10-CM

## 2021-11-08 DIAGNOSIS — I8510 Secondary esophageal varices without bleeding: Secondary | ICD-10-CM

## 2021-11-08 DIAGNOSIS — K766 Portal hypertension: Secondary | ICD-10-CM

## 2021-11-08 DIAGNOSIS — K7460 Unspecified cirrhosis of liver: Secondary | ICD-10-CM

## 2021-11-08 DIAGNOSIS — K3189 Other diseases of stomach and duodenum: Secondary | ICD-10-CM

## 2021-11-08 DIAGNOSIS — R69 Illness, unspecified: Secondary | ICD-10-CM | POA: Diagnosis not present

## 2021-11-08 LAB — CBC
HCT: 44.9 % (ref 39.0–52.0)
Hemoglobin: 15.1 g/dL (ref 13.0–17.0)
MCHC: 33.7 g/dL (ref 30.0–36.0)
MCV: 86.6 fl (ref 78.0–100.0)
Platelets: 114 10*3/uL — ABNORMAL LOW (ref 150.0–400.0)
RBC: 5.18 Mil/uL (ref 4.22–5.81)
RDW: 15.3 % (ref 11.5–15.5)
WBC: 7.5 10*3/uL (ref 4.0–10.5)

## 2021-11-08 LAB — COMPREHENSIVE METABOLIC PANEL
ALT: 31 U/L (ref 0–53)
AST: 39 U/L — ABNORMAL HIGH (ref 0–37)
Albumin: 4.3 g/dL (ref 3.5–5.2)
Alkaline Phosphatase: 110 U/L (ref 39–117)
BUN: 8 mg/dL (ref 6–23)
CO2: 25 mEq/L (ref 19–32)
Calcium: 9.9 mg/dL (ref 8.4–10.5)
Chloride: 102 mEq/L (ref 96–112)
Creatinine, Ser: 0.71 mg/dL (ref 0.40–1.50)
GFR: 99.02 mL/min (ref >60.00)
Glucose, Bld: 96 mg/dL (ref 70–99)
Potassium: 3.9 mEq/L (ref 3.5–5.1)
Sodium: 135 mEq/L (ref 135–145)
Total Bilirubin: 1.5 mg/dL — ABNORMAL HIGH (ref 0.2–1.2)
Total Protein: 7.8 g/dL (ref 6.0–8.3)

## 2021-11-08 LAB — PROTIME-INR
INR: 1.1 ratio — ABNORMAL HIGH (ref 0.8–1.0)
Prothrombin Time: 12.2 s (ref 9.6–13.1)

## 2021-11-08 NOTE — Patient Instructions (Signed)
Your provider has requested that you go to the basement level for lab work before leaving today. Press "B" on the elevator. The lab is located at the first door on the left as you exit the elevator.   You have been scheduled for an abdominal ultrasound at [facility] (1st floor of hospital) on 11/14/21 at 9:00 am . Please arrive 15 minutes prior to your appointment for registration. Make certain not to have anything to eat or drink 6 hours prior to your appointment. Should you need to reschedule your appointment, please contact radiology at [phone]. This test typically takes about 30 minutes to perform.  Follow-up in 5 months. Office to contact at a later time to schedule.   _______________________________________________________  If you are age 13 or older, your body mass index should be between 23-30. Your Body mass index is 30.15 kg/m. If this is out of the aforementioned range listed, please consider follow up with your Primary Care Provider.  If you are age 61 or younger, your body mass index should be between 19-25. Your Body mass index is 30.15 kg/m. If this is out of the aformentioned range listed, please consider follow up with your Primary Care Provider.   ________________________________________________________  The [facility] providers would like to encourage you to use [facility] to communicate with providers for non-urgent requests or questions.  Due to long hold times on the telephone, sending your provider a message by [facility] may be a faster and more efficient way to get a response.  Please allow 48 business hours for a response.  Please remember that this is for non-urgent requests.  _______________________________________________________  Thank you for choosing me and [facility].  Dr. Rush Landmark

## 2021-11-10 ENCOUNTER — Encounter: Payer: Self-pay | Admitting: Gastroenterology

## 2021-11-10 DIAGNOSIS — I8510 Secondary esophageal varices without bleeding: Secondary | ICD-10-CM | POA: Insufficient documentation

## 2021-11-10 DIAGNOSIS — I8500 Esophageal varices without bleeding: Secondary | ICD-10-CM | POA: Insufficient documentation

## 2021-11-10 DIAGNOSIS — K3189 Other diseases of stomach and duodenum: Secondary | ICD-10-CM | POA: Insufficient documentation

## 2021-11-10 NOTE — Progress Notes (Signed)
[place] VISIT   Primary Care Provider Collene Leyden, MD [place] [address] [phone]  Patient Profile: Robert Greene is a 61 y.o. male with a pmh significant for CAD, hypertension, hyperlipidemia, nephrolithiasis, rheumatoid arthritis, alcoholic cirrhosis (complicated by portal hypertension manifested as EVs, PHG, Thrombocytopenia), colon polyps (TAs), duodenal SEL.  The patient presents to the [facility] for an evaluation and management of problem(s) noted below:  Problem List 1. Alcoholic cirrhosis of liver without ascites ([place])   2. Secondary esophageal varices without bleeding ([place])   3. Portal hypertensive gastropathy ([place])   4. Duodenal nodule    History of Present Illness Please see prior notes for full details of HPI.  Interval History The patient returns for follow-up and is accompanied by his daughter today.  The patient continues to work on alcohol cessation.  Patient has been doing relatively well since his last visit to clinic.  The patient denies any issues with jaundice, scleral icterus, generalized pruritus, darkened/amber urine, clay-colored stools, LE edema, hematemesis, coffee-ground emesis, abdominal distention, confusion.  He is aware that he is due for [facility] screening.  He is willing to consider the repeat colonoscopic evaluation early next year due to his poor preparation but finding of adenomatous tissue.  He is also willing to go endoscopic ultrasound to further evaluate the duodenal subepithelial lesion at that same time.  GI Review of Systems Positive as above Negative for dysphagia, odynophagia, nausea, vomiting, pain, alteration of bowel habits, melena, hematochezia   Review of Systems General: Denies fevers/chills/weight loss unintentionally Cardiovascular: Denies chest pain Pulmonary: Denies shortness of breath Gastroenterological: See HPI Genitourinary: Denies darkened  urine Hematological: Denies easy bruising/bleeding Dermatological: Denies jaundice Psychological: Mood is stable   Medications Current Outpatient Medications  Medication Sig Dispense Refill   atorvastatin (LIPITOR) 80 MG tablet Take 80 mg by mouth daily.  1   cyclobenzaprine (FLEXERIL) 10 MG tablet Take 1 tablet (10 mg total) by mouth 2 (two) times daily as needed for muscle spasms. 20 tablet 0   diphenhydrAMINE (BENADRYL) 25 MG tablet Take 25 mg by mouth daily as needed for itching.      EPINEPHrine 0.3 mg/0.3 mL IJ SOAJ injection Use once.  If ineffective, use 2nd dose. 2 Device 0   etanercept (ENBREL) 50 MG/ML injection Inject 50 mg into the skin once a week.     hydrochlorothiazide (HYDRODIURIL) 12.5 MG tablet TAKE 1 TABLET BY MOUTH EVERY DAY 90 tablet 1   losartan (COZAAR) 100 MG tablet TAKE 1 TABLET BY MOUTH EVERY DAY 90 tablet 1   nadolol (CORGARD) 20 MG tablet Take 1 tablet (20 mg total) by mouth daily. 30 tablet 6   Omega-3 Fatty Acids (FISH OIL PO) Take 2 capsules by mouth 2 (two) times daily.      omeprazole (PRILOSEC) 20 MG capsule Take 20 mg by mouth daily.     amLODipine (NORVASC) 5 MG tablet TAKE 1 TABLET BY MOUTH EVERY DAY (Patient not taking: Reported on 11/08/2021) 90 tablet 0   No current facility-administered medications for this visit.    Allergies Allergies  Allergen Reactions   Bee Venom Anaphylaxis, Hives and Rash   Spider Antivenin [S Black Widow (Latrodec Mactans) Antivenin] Anaphylaxis, Hives and Rash    Histories Past Medical History:  Diagnosis Date   Alcoholism ([place])    Anxiety    Arthritis    rheumatoid   Coronary artery calcification seen on CAT scan 04/08/2018  Dyspnea    HTN (hypertension) 04/08/2018   Hypercholesteremia    Hypertension    Interstitial lung disease ([place])    Kidney stones 2020   Pulmonary fibrosis [facility])    Past Surgical History:  Procedure Laterality Date   KNEE CARTILAGE SURGERY Left    LUNG BIOPSY Right 12/06/2017    Procedure: LUNG BIOPSY;  Surgeon: Melrose Nakayama, MD;  Location: [place];  Service: Thoracic;  Laterality: Right;   VIDEO ASSISTED THORACOSCOPY Right 12/06/2017   Procedure: VIDEO ASSISTED THORACOSCOPY;  Surgeon: Melrose Nakayama, MD;  Location: [facility] OR;  Service: Thoracic;  Laterality: Right;   Social History   Socioeconomic History   Marital status: Married    Spouse name: Not on file   Number of children: 2   Years of education: Not on file   Highest education level: Not on file  Occupational History   Not on file  Tobacco Use   Smoking status: Every Day    Packs/day: 1.50    Years: 40.00    Total pack years: 60.00    Types: Cigarettes   Smokeless tobacco: Never   Tobacco comments:    Currently smoking 3/4ppd as of 07/14/21 ep  Vaping Use   Vaping Use: Every day  Substance and Sexual Activity   Alcohol use: Yes    Alcohol/week: 1.0 standard drink of alcohol    Types: 1 Standard drinks or equivalent per week    Comment: daily, about 3 to 5 a day   Drug use: No   Sexual activity: Yes    Birth control/protection: None  Other Topics Concern   Not on file  Social History Narrative   Not on file   Social Determinants of Health   Financial Resource Strain: Not on file  Food Insecurity: Not on file  Transportation Needs: Not on file  Physical Activity: Not on file  Stress: Not on file  Social Connections: Not on file  Intimate Partner Violence: Not on file   Family History  Problem Relation Age of Onset   Cirrhosis Father    Colon cancer Neg Hx    Stomach cancer Neg Hx    Esophageal cancer Neg Hx    Inflammatory bowel disease Neg Hx    Liver disease Neg Hx    Pancreatic cancer Neg Hx    Rectal cancer Neg Hx    I have reviewed his medical, social, and family history in detail and updated the electronic medical record as necessary.    PHYSICAL EXAMINATION  BP 136/72   Pulse (!) 53   Ht _0  (1.778 m)   Wt 210 lb 2 oz (95.3 kg)   BMI 30.15 kg/m   Wt Readings from Last 3 Encounters:  11/08/21 210 lb 2 oz (95.3 kg)  09/07/21 207 lb (93.9 kg)  08/03/21 211 lb (95.7 kg)  GEN: NAD, appears older than stated age, doesn't appear chronically ill, accompanied by daughter PSYCH: Cooperative, without pressured speech EYE: Conjunctivae pale-pink, sclerae anicteric ENT: MMM CV: Nontachycardic RESP: No audible wheezing GI: NABS, soft, protuberant abdomen, rounded, obese, NT, no rebound MSK/EXT: 1/2+ pedal edema bilaterally SKIN: Positive for spider angiomata on upper thorax; no jaundice NEURO:  Alert & Oriented x 3, no focal deficits, no evidence of asterixis   REVIEW OF DATA  I reviewed the following data at the time of this encounter:  GI Procedures and Studies  Previous EGD/colonoscopy were reviewed with the patient and daughter  Laboratory Studies  Reviewed those in  epic  Imaging Studies  No new imaging studies to review   ASSESSMENT  Mr. Kisling is a 61 y.o. male with a pmh significant for CAD, hypertension, hyperlipidemia, nephrolithiasis, rheumatoid arthritis, alcoholic cirrhosis (complicated by portal hypertension manifested as EVs, PHG, Thrombocytopenia), colon polyps (TAs), duodenal SEL.  The patient presents to the [facility] for an evaluation and management of problem(s) noted below:  1. Alcoholic cirrhosis of liver without ascites ([place])   2. Secondary esophageal varices without bleeding ([place])   3. Portal hypertensive gastropathy ([place])   4. Duodenal nodule    The patient is hemodynamically and clinically stable at this time.  He remains in a compensated status.  He seems to have tolerated his beta-blocker at this time and will not have much room in his heart rate to alter things so we will keep him where he is.  He was offered hepatitis B vaccination/immunization but wants time to think about this (does not put himself at risk factors for getting this infection at this time).  He needs to continue  to work on alcohol cessation.  He needs a repeat colonoscopic evaluation due to a poor preparation and we will do that at the same time as an upper endoscopic ultrasound to evaluate the duodenal SEL/nodule that was found during EGD.  He is due for [facility] screening and we will get that scheduled in the coming weeks.  We will see him in early 2024 for his endoscopic evaluations.  Hopefully if all looks well with his laboratories we will see him 3-4 times per year and try to keep him compensated.  All patient questions were answered to the best of my ability, and the patient agrees to the aforementioned plan of action with follow-up as indicated.   PLAN  Laboratories as outlined below Complete alcohol cessation recommended again  Volume -Obtain daily standing weight and update [place] if weight increases by more than 3 pounds in a week -2000 mg Na diet Infection -No need for prophylaxis Bleeding -EVs in 2023 due for repeat in 2026 -On NSBB  Encephalopathy -None Screening -Need to update with Liver U/S in coming weeks Transplant -We have not broached this idea as of yet Vaccination -He needs HBV vaccination, he continues to think on this Other -Promote intake of 1.5 g/kg/day of Protein (Ensures/Boosts at each meal)  Upper endoscopic ultrasound evaluation and repeat colonoscopy in 2024   Orders Placed This Encounter  Procedures   US Abdomen Limited RUQ (LIVER/GB)   CBC   Comp Met (CMET)   INR/PT    New Prescriptions   No medications on file   Modified Medications   No medications on file    Planned Follow Up Return in about 5 months (around 04/09/2022).   Total Time in Face-to-Face and in Coordination of Care for patient including independent/personal interpretation/review of prior testing, medical history, examination, medication adjustment, communicating results with the patient directly, and documentation within the EHR is 25 minutes.   Justice Britain, MD [place]oscopy Office # [phone]

## 2021-11-14 ENCOUNTER — Ambulatory Visit (HOSPITAL_COMMUNITY)
Admission: RE | Admit: 2021-11-14 | Discharge: 2021-11-14 | Disposition: A | Discharge status: Home / Self Care | Payer: Medicare HMO | Source: Ambulatory Visit | Attending: Gastroenterology | Admitting: Gastroenterology

## 2021-11-14 DIAGNOSIS — R69 Illness, unspecified: Secondary | ICD-10-CM | POA: Diagnosis not present

## 2021-11-14 DIAGNOSIS — K7030 Alcoholic cirrhosis of liver without ascites: Secondary | ICD-10-CM | POA: Diagnosis not present

## 2021-11-14 DIAGNOSIS — K824 Cholesterolosis of gallbladder: Secondary | ICD-10-CM | POA: Diagnosis not present

## 2021-11-14 DIAGNOSIS — K3189 Other diseases of stomach and duodenum: Secondary | ICD-10-CM | POA: Diagnosis not present

## 2021-11-14 DIAGNOSIS — K7460 Unspecified cirrhosis of liver: Secondary | ICD-10-CM | POA: Diagnosis not present

## 2021-11-15 DIAGNOSIS — I7300 Raynaud's syndrome without gangrene: Secondary | ICD-10-CM | POA: Diagnosis not present

## 2021-11-15 DIAGNOSIS — Z6830 Body mass index (BMI) 30.0-30.9, adult: Secondary | ICD-10-CM | POA: Diagnosis not present

## 2021-11-15 DIAGNOSIS — E669 Obesity, unspecified: Secondary | ICD-10-CM | POA: Diagnosis not present

## 2021-11-15 DIAGNOSIS — M0609 Rheumatoid arthritis without rheumatoid factor, multiple sites: Secondary | ICD-10-CM | POA: Diagnosis not present

## 2021-11-15 DIAGNOSIS — M353 Polymyalgia rheumatica: Secondary | ICD-10-CM | POA: Diagnosis not present

## 2021-11-15 DIAGNOSIS — J849 Interstitial pulmonary disease, unspecified: Secondary | ICD-10-CM | POA: Diagnosis not present

## 2021-11-15 DIAGNOSIS — R7989 Other specified abnormal findings of blood chemistry: Secondary | ICD-10-CM | POA: Diagnosis not present

## 2021-11-15 DIAGNOSIS — M25461 Effusion, right knee: Secondary | ICD-10-CM | POA: Diagnosis not present

## 2021-11-15 DIAGNOSIS — Z7952 Long term (current) use of systemic steroids: Secondary | ICD-10-CM | POA: Diagnosis not present

## 2022-01-05 ENCOUNTER — Ambulatory Visit (HOSPITAL_BASED_OUTPATIENT_CLINIC_OR_DEPARTMENT_OTHER)
Admission: RE | Admit: 2022-01-05 | Discharge: 2022-01-05 | Disposition: A | Discharge status: Home / Self Care | Payer: Medicare HMO | Source: Ambulatory Visit | Attending: Internal Medicine | Admitting: Internal Medicine

## 2022-01-05 DIAGNOSIS — J849 Interstitial pulmonary disease, unspecified: Secondary | ICD-10-CM | POA: Diagnosis not present

## 2022-01-05 DIAGNOSIS — J432 Centrilobular emphysema: Secondary | ICD-10-CM | POA: Diagnosis not present

## 2022-01-05 DIAGNOSIS — J8410 Pulmonary fibrosis, unspecified: Secondary | ICD-10-CM | POA: Diagnosis not present

## 2022-01-11 ENCOUNTER — Telehealth: Payer: Self-pay | Admitting: Internal Medicine

## 2022-01-11 NOTE — Telephone Encounter (Signed)
Called patient and he states he would like the results of his recent CT scan that was done. CT was done 01/05/2022.   Please advise sir

## 2022-01-12 NOTE — Telephone Encounter (Signed)
Called and spoke to patient and let him know his results for his CT scan. He verbalized understanding. Nothing further needed

## 2022-01-12 NOTE — Telephone Encounter (Signed)
  Fibrosis + Stabel fibrosis since Nov 2022 but wore sinc3 2019  Narrative & Impression  CLINICAL DATA:  Interstitial lung disease.   EXAM: CT CHEST WITHOUT CONTRAST   TECHNIQUE: Multidetector CT imaging of the chest was performed following the standard protocol without intravenous contrast. High resolution imaging of the lungs, as well as inspiratory and expiratory imaging, was performed.   RADIATION DOSE REDUCTION: This exam was performed according to the departmental dose-optimization program which includes automated exposure control, adjustment of the mA and/or kV according to patient size and/or use of iterative reconstruction technique.   COMPARISON:  12/09/2020, 09/06/2017.   FINDINGS: Cardiovascular: Atherosclerotic calcification of the aorta, aortic valve and coronary arteries. Heart is at the upper limits of normal in size to mildly enlarged. No pericardial effusion.   Mediastinum/Nodes: No pathologically enlarged mediastinal or axillary lymph nodes. Hilar regions are difficult to definitively evaluate without IV contrast. Esophagus is grossly unremarkable.   Lungs/Pleura: Centrilobular and paraseptal emphysema. Peripheral and basilar predominant subpleural reticulation and ground-glass, similar to 12/09/2020 and minimally progressive from 09/06/2017. No pleural fluid. Airway is unremarkable. No air trapping.   Upper Abdomen: Liver is decreased in attenuation diffusely. Liver margin is irregular. Visualized portions of the spleen stomach are grossly unremarkable.   Musculoskeletal: Degenerative changes in the spine. No worrisome lytic or sclerotic lesions.   IMPRESSION: 1. Pulmonary parenchymal pattern of fibrosis, as detailed above, similar to most recent prior exam but progressive from 09/06/2017. Findings are categorized as probable UIP per consensus guidelines: Diagnosis of Idiopathic Pulmonary Fibrosis: An Official ATS/ERS/JRS/ALAT Clinical Practice  Guideline. [place], Iss 5, [phone], Sep 23 2016. 2. Steatotic cirrhotic liver. 3. Aortic atherosclerosis (ICD10-I70.0). Coronary artery calcification. 4.  Emphysema (ICD10-J43.9).     Electronically Signed   By: Lorin Picket M.D.   On: 01/06/2022 14:19

## 2022-02-14 ENCOUNTER — Encounter: Payer: Self-pay | Admitting: Internal Medicine

## 2022-02-14 ENCOUNTER — Ambulatory Visit (INDEPENDENT_AMBULATORY_CARE_PROVIDER_SITE_OTHER): Payer: Medicare HMO | Admitting: Internal Medicine

## 2022-02-14 ENCOUNTER — Ambulatory Visit: Payer: Medicare HMO | Admitting: Internal Medicine

## 2022-02-14 VITALS — BP 122/64 | HR 74 | Temp 98.7°F | Ht 69.25 in | Wt 211.0 lb

## 2022-02-14 DIAGNOSIS — J849 Interstitial pulmonary disease, unspecified: Secondary | ICD-10-CM

## 2022-02-14 DIAGNOSIS — R69 Illness, unspecified: Secondary | ICD-10-CM | POA: Diagnosis not present

## 2022-02-14 DIAGNOSIS — F17210 Nicotine dependence, cigarettes, uncomplicated: Secondary | ICD-10-CM | POA: Diagnosis not present

## 2022-02-14 DIAGNOSIS — Z7185 Encounter for immunization safety counseling: Secondary | ICD-10-CM

## 2022-02-14 LAB — PULMONARY FUNCTION TEST
DL/VA % pred: 98 %
DL/VA: 4.16 ml/min/mmHg/L
DLCO cor % pred: 89 %
DLCO cor: 24.13 ml/min/mmHg
DLCO unc % pred: 89 %
DLCO unc: 24.13 ml/min/mmHg
FEF 25-75 Pre: 2.24 L/sec
FEF2575-%Pred-Pre: 78 %
FEV1-%Pred-Pre: 89 %
FEV1-Pre: 3.12 L
FEV1FVC-%Pred-Pre: 97 %
FEV6-%Pred-Pre: 95 %
FEV6-Pre: 4.19 L
FEV6FVC-%Pred-Pre: 103 %
FVC-%Pred-Pre: 91 %
FVC-Pre: 4.24 L
Pre FEV1/FVC ratio: 74 %
Pre FEV6/FVC Ratio: 99 %

## 2022-02-14 NOTE — Progress Notes (Signed)
Subjective:  Patient ID: Robert Greene, male , DOB: [birth year] , age 62 y.o. , MRN: [id number] , ADDRESS: [place] [id number] PCP Collene Leyden, MD Patient Care Team: Collene Leyden, MD as PCP - General (Family Medicine) Lavonna Monarch, MD as Consulting Physician (Dermatology)  This Provider for this visit: Treatment Team:  Attending Provider: Brand Males, MD    Chief Complaint  Patient presents with   Follow-up    ILD, doing well       HPI Robert Greene 62 y.o. 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  Overall he is stable as documented in the symptom score below.  He continues to smoke.  In fact he smoking half pack cigarettes a day.  In terms of his vaccination he has had Prevnar but not Pneumovax.  He is willing to have Pneumovax.  He says he has had his flu shot but the records do not seem updated for this.  He again refused Covid vaccine.  His symptom score and PFTs are stable.  He did not have high-resolution CT chest but instead he had low-dose CT chest for lung cancer screening.  But at least looking at symptom score walk test and desaturation test everything is stable in terms of his ILD.  He complains of some dyspnea on exertion as documented below.  We offered him pulmonary rehabilitation but he refused.  He says he is working out enough in the yard.    CT chest LDCT 12/08/19   IMPRESSION: 1. Lung-RADS 1, negative. Continue annual screening with low-dose chest CT without contrast in 12 months. 2. Basilar predominant subpleural fibrosis, suboptimally characterized with low-dose technique. Nonspecific interstitial pneumonitis or usual interstitial pneumonitis could have this appearance. Patient underwent lung biopsy 12/06/2017. 3. Aortic atherosclerosis (ICD10-I70.0). Coronary artery calcification. 4.  Emphysema (ICD10-J43.9).     Electronically Signed   By: Lorin Picket M.D.   On: 12/08/2019 11:24   OV  10/22/2020      10/22/2020 -   Chief Complaint  Patient presents with   Follow-up    Pt states he has been doing okay since last visit and states his breathing is about the same.     HPI Robert Greene 62 y.o. -with a history of smoking related-ILD and rheumatoid arthritis here for follow-up.  Patient reports that since his last visit he has been doing about the same.  His breathing has been stable at rest.  However he continues to have dyspnea on exertion especially after working in his yard.  He reports difficulty walking on the stairs but he thinks this is more related to joint stiffness and pain from his rheumatoid arthritis.  He has cut down on his smoking to about half a pack of cigarettes per day.  He wants to continue to quit cold [place] and is not interested in any smoking cessation resources.  States he received the pneumonia vaccine last year and he is interested in getting the flu vaccine today.     CT Chest data   OV 12/10/2020  Subjective:  Patient ID: Robert Greene, male , DOB: 1960/04/19 , age 62 y.o. , MRN: [id number] , ADDRESS: [place] [id number] PCP Collene Leyden, MD Patient Care Team: Collene Leyden, MD as PCP - General (Family Medicine) Lavonna Monarch, MD as Consulting Physician (Dermatology)  This Provider for this visit: Treatment Team:  Attending Provider: Brand Males, MD  Type of visit: Telephone/Video Circumstance: COVID-19 national  emergency Identification of patient Robert Greene with [birth year] and MRN [id number] - 2 person identifier Risks: Risks, benefits, limitations of telephone visit explained. Patient understood and verbalized agreement to proceed Anyone else on call: just patient Patient location: his home - [phone] This provider location: [address] [place] [address]; [place]; [place] [address]. [facility]. [phone]    12/10/2020 -  call to give CT abd results.  He has interstitial lung  disease.  He has follow-up appointment pending with me in mid December 2022.  He had routine ILD CT scan and this shows ILD stable but the report also shows new onset of cirrhosis with liver mass.  I therefore set up this telephone appointment to call him and give him the results.  He said his wife passed away at age 1 from a heart attack in the spring 2022.  Since then he has been drinking a lot of alcohol.  He saw his rheumatologist Dr. Amil Amen recently and was advised to quit drinking because his liver enzymes were high.  I did express to him that there is findings of cirrhosis.  He told me that a few years ago he had right upper quadrant ultrasound it was normal.  Expressed to him that he also has a liver mass.  Told him this requires an MRI.  He recollects having had an MRI.  He says there is no metal in his body.  He feels that there is no claustrophobia and he can safely have an MRI.  Told him that I would set up these test but then refer him to GI.  He is agreeable with this plan.   HPI Robert Greene 62 y.o. -    CT Chest data  CT Chest High Resolution  Result Date: 12/09/2020 CLINICAL DATA:  62 year old male with history of pulmonary fibrosis. Shortness of breath on exertion. Smoker. EXAM: CT CHEST WITHOUT CONTRAST TECHNIQUE: Multidetector CT imaging of the chest was performed following the standard protocol without intravenous contrast. High resolution imaging of the lungs, as well as inspiratory and expiratory imaging, was performed. COMPARISON:  Chest CT 12/08/2019. FINDINGS: Cardiovascular: Heart size is normal. There is no significant pericardial fluid, thickening or pericardial calcification. There is aortic atherosclerosis, as well as atherosclerosis of the great vessels of the mediastinum and the coronary arteries, including calcified atherosclerotic plaque in the left main, left anterior descending and left circumflex coronary arteries. Mediastinum/Nodes: No pathologically  enlarged mediastinal or hilar lymph nodes. Esophagus is unremarkable in appearance. No axillary lymphadenopathy. Lungs/Pleura: High-resolution images again demonstrate some patchy areas of ground-glass attenuation, septal thickening, subpleural reticulation, traction bronchiectasis and peripheral bronchiolectasis. Findings are mildly progressive compared to the prior study. Findings are also asymmetric involving the right lung to a greater extent than the left (similar to the prior study). No frank honeycombing. Inspiratory and expiratory imaging is unremarkable. There is also mild diffuse bronchial wall thickening with mild centrilobular and paraseptal emphysema. No acute consolidative airspace disease. No pleural effusions. No definite suspicious appearing pulmonary nodules or masses are noted. Upper Abdomen: Diffuse low attenuation throughout the visualized hepatic parenchyma, indicative of a background of hepatic steatosis. Liver has a slightly nodular contour, suggesting underlying cirrhosis. There is an incompletely imaged mass-like area in the right upper quadrant of the abdomen, which appears contiguous with the undersurface of the right lobe of the liver in the region of segment 6 (axial image 177 of series 2), measuring at least 4.3 x 3.6 cm with  ill-defined poorly delineated margins, concerning for potential neoplasm. Musculoskeletal: There are no aggressive appearing lytic or blastic lesions noted in the visualized portions of the skeleton. IMPRESSION: 1. The appearance of the lungs is compatible with interstitial lung disease, with a spectrum of findings categorized as probable usual interstitial pneumonia (UIP) per current ATS guidelines. 2. Mild diffuse bronchial wall thickening with mild centrilobular and paraseptal emphysema is also noted; imaging findings suggestive of concurrent COPD. 3. Aortic atherosclerosis, in addition to left main and 3 vessel coronary artery disease. Please note that  although the presence of coronary artery calcium documents the presence of coronary artery disease, the severity of this disease and any potential stenosis cannot be assessed on this non-gated CT examination. Assessment for potential risk factor modification, dietary therapy or pharmacologic therapy may be warranted, if clinically indicated. 4. Hepatic steatosis, with morphologic changes in the liver concerning for underlying cirrhosis. Notably, there is also an incompletely imaged mass-like area in the right upper quadrant of the abdomen, potentially emanating from the liver. This has poorly defined margins and is concerning for neoplasm. Further evaluation with nonemergent abdominal MRI with and without IV gadolinium is strongly recommended in the near future to better evaluate this finding. These results will be called to the ordering clinician or representative by the Radiologist Assistant, and communication documented in the PACS or [employer]. Aortic Atherosclerosis (ICD10-I70.0) and Emphysema (ICD10-J43.9). Electronically Signed   By: Vinnie Langton M.D.   On: 12/09/2020 19:21       OV 01/04/2021  Subjective:  Patient ID: Robert Greene, male , DOB: 03/09/1960 , age 29 y.o. , MRN: [id number] , ADDRESS: [place] [id number] PCP Collene Leyden, MD Patient Care Team: Collene Leyden, MD as PCP - General (Family Medicine) Lavonna Monarch, MD as Consulting Physician (Dermatology)  This Provider for this visit: Treatment Team:  Attending Provider: Brand Males, MD    01/04/2021 -   Chief Complaint  Patient presents with   Follow-up    PFT performed today. HRCT and MRA recently performed. Pt states he has been doing okay since last visit and denies any complaints. States breathing is about the same.     HPI Robert Greene 62 y.o. -returns for follow-up for smoking-related interstitial fibrosis.  #ILD-smoking-related interstitial lung fibrosis [also rheumatoid  arthritis on TNF alpha blockade]-continues to be stable.  Pulmonary function test might show slow decline over many years but most recently stable.  CT scan of the chest is also deemed stable  #Incidental finding: When we did the high-resolution CT the CT chest showed some liver masses.  So we got an MRI there are no liver masses but he definitely has cirrhosis.  He states he continues to drink alcohol because of grief reaction after his wife passed away.  He is also smoking now.  His liver function test is abnormal.  I referred him to GI but for some reason this has not happened he does not know why.  We also got hepatitis virus panel.  He is worried about the findings on his liver.  He knows he needs to quit but is unable to    #smoking: This has not been relapse for 2 years.  Problem with a risk factor for progression of the ILD.   CT Chest data    OV 07/14/2021  Subjective:  Patient ID: Robert Greene, male , DOB: 1960/08/02 , age 62 y.o. , MRN: [id number] , ADDRESS: [id number] [place] [id number]  PCP Collene Leyden, MD Patient Care Team: Collene Leyden, MD as PCP - General (Family Medicine) Lavonna Monarch, MD as Consulting Physician (Dermatology)  This Provider for this visit: Treatment Team:  Attending Provider: Brand Males, MD    07/14/2021 -   Chief Complaint  Patient presents with   Follow-up    PFT performed today.  Pt states he has been doing okay since last visit.     HPI CALDEN DORSEY 62 y.o. -returns for follow-up for his interstitial lung disease [S RIF] associated with smoking but he also has rheumatoid arthritis.  Overall he feels stable.  His pulmonary function test is stable.  Still normal within normal limits.  He has no new problems.  His smoking has relapsed.  He has not been able to quit.  He wants to quit on his own.  He had early cirrhosis referred him to GI.  He has seen Earnest Bailey and Dr. Rush Landmark.  An endoscopy is being planned.  There are no  other issues.  He says he is quit hard liquor but he takes an occasional beer.     OV 02/14/2022  Subjective:  Patient ID: Robert Greene, male , DOB: 03-10-1960 , age 1 y.o. , MRN: [id number] , ADDRESS: [place] [id number] PCP Collene Leyden, MD Patient Care Team: Collene Leyden, MD as PCP - General (Family Medicine) Lavonna Monarch, MD (Inactive) as Consulting Physician (Dermatology)  This Provider for this visit: Treatment Team:  Attending Provider: Brand Males, MD  #ILD-smoking-related interstitial lung fibrosis [also rheumatoid arthritis on TNF alpha blockade]-continues to be stable.  .  CT scan of the chest is also deemed stable  02/14/2022 -   Chief Complaint  Patient presents with   Follow-up    Pt is here for follow up for IPF. Pt did have spiro before visit. CT scan was done last month on 12/14. Pt is not on any pulmonary medications at the moment     HPI Robert Greene 62 y.o. -minutes returns for 70-monthfollow-up.  He continues to do well.  He is improving in terms of his grief reaction from the loss of his wife.  He is now dating someone.  However he still is smoking a few cigarettes here and there.  He also continues to drink.  He has seen gastroenterology Dr. GJustice Britain10/17/2023.  External records reviewed.  He has been asked to return for follow-up in 5 months.  He has been asked to quit alcohol intake.  He says he is struggling to do this but is trying.  In any event symptoms are stable.  Pulmonary function test done today and reviewed is also stable.  He endorses no new issues.  He continues on Enbrel for his rheumatoid arthritis.   Issue #2: Smoking.  Advised him to quit.  He is known to quit on his own.  Issue #3: Vaccine counseling.  Recommended RSV vaccine.  Recommend flu shot todaybut he already had it.  He will not have the COVID-vaccine.  Recommend shingles vaccine  Issue 4: There is mild emphysema on the CT.  He is stable  without any wheezing or cough.  Will give him albuterol.    SYMPTOM SCALE - ILD 03/12/2018  06/26/2019  01/01/2020  10/22/2020 02/14/2022    O2 use ra  ra  ra  Shortness of Breath 0 -> 5 scale with 5 being worst (score 6 If unable to do)      At rest 1  $'1 1 1 'u$ 0  Simple tasks - showers, clothes change, eating, shaving 0* '1 2 1 '$ 0  Household (dishes, doing bed, laundry) 1* '2 2 2 '$ 0  Shopping 0 '1 1 1 '$ 0  Walking level at own pace '1 2 1 '$ 0 1  Walking up Stairs '2 3 3 3 2  '$ Total (40 - 48) Dyspnea Score '8 10 10 9 3  '$ How bad is your cough? '1 1 1 1 1  '$ How bad is your fatigue '1 1 1 1 '$ 0  nausea  '1 2 1 '$ 0  vomit  0 0 0 0  diarrhea  0 0 0 0  anxiety  1 0 1 0  depression  0 0 2 1     Simple office walk 185 feet x  3 laps goal with forehead probe 09/26/2017  11/22/2017  03/12/2018  /yf  06/26/2019  10/22/2020  O2 used Room air Room air Room air  Room air Room air  Number laps completed '3 3 3 '$ x 185 feet  3 x 185 3  Comments about pace Normal to mod pace  normal  nl nl  Resting Pulse Ox/HR 99% and 69/min 100% and 73/min 99% and 68/min 96% annd 91/min 100% and 69 100% and 63  Final Pulse Ox/HR 99% and 89/min 100% and 97/min 97% and 88/min 96% and 101 100% and 91/min 100% and 102  Desaturated </= 88% no no no  no no  Desaturated <= 3% points no no no  no Yes  Got Tachycardic >/= 90/min No but almost yes no  yes Yes  Symptoms at end of test no x none  Mild dyspnea Mild SOB  Miscellaneous comments x x x   x   Simple walk test   Modified Six Minute Walk - 02/14/22 1100     Type of O2 used  Room Air    Number of laps completed  3    Lap Pace Moderate    Resting Heartrate 94 bpm    Final Heartrate 104 bpm    Resting Pulse Ox 99 %    Desaturated to <= 3 points No    Desaturated to < 88% No    Became tachycardic No    Symptoms  patietn reported no symptoms other then have rhuematiod arth and having to walk slow and steady    Was the O2 correction test done? No    comments lap1: 98%/104,  lap2:98%/110, lap3:100%/104 and final 100%/92               PFT     Latest Ref Rng & Units 02/14/2022   10:24 AM 07/14/2021   12:44 PM 01/04/2021    9:38 AM 07/30/2020   11:40 AM 12/26/2019    9:48 AM 06/16/2019    8:51 AM 03/12/2018    9:01 AM  PFT Results  FVC-Pre L 4.24  P 4.25  4.00  4.10  4.24  4.14  3.69   FVC-Predicted Pre % 91  P 92  86  88  90  88  77   Pre FEV1/FVC % % 74  P 75  75  73  72  71  72   FEV1-Pre L 3.12  P 3.17  3.01  2.99  3.07  2.96  2.64   FEV1-Predicted Pre % 89  P 91  85  85  86  83  73   DLCO uncorrected ml/min/mmHg 24.13  P 22.34  25.46  25.83  26.60  28.02  29.08   DLCO UNC% % 89  P 83  94  95  97  103  105   DLCO corrected ml/min/mmHg 24.13  P 23.72  27.73  25.83  26.60  28.02    DLCO COR %Predicted % 89  P 88  102  95  97  103    DLVA Predicted % 98  P 92  114  103  99  113  119     P Preliminary result    HRCT 01/06/22  Narrative & Impression  CLINICAL DATA:  Interstitial lung disease.   EXAM: CT CHEST WITHOUT CONTRAST   TECHNIQUE: Multidetector CT imaging of the chest was performed following the standard protocol without intravenous contrast. High resolution imaging of the lungs, as well as inspiratory and expiratory imaging, was performed.   RADIATION DOSE REDUCTION: This exam was performed according to the departmental dose-optimization program which includes automated exposure control, adjustment of the mA and/or kV according to patient size and/or use of iterative reconstruction technique.   COMPARISON:  12/09/2020, 09/06/2017.   FINDINGS: Cardiovascular: Atherosclerotic calcification of the aorta, aortic valve and coronary arteries. Heart is at the upper limits of normal in size to mildly enlarged. No pericardial effusion.   Mediastinum/Nodes: No pathologically enlarged mediastinal or axillary lymph nodes. Hilar regions are difficult to definitively evaluate without IV contrast. Esophagus is grossly unremarkable.    Lungs/Pleura: Centrilobular and paraseptal emphysema. Peripheral and basilar predominant subpleural reticulation and ground-glass, similar to 12/09/2020 and minimally progressive from 09/06/2017. No pleural fluid. Airway is unremarkable. No air trapping.   Upper Abdomen: Liver is decreased in attenuation diffusely. Liver margin is irregular. Visualized portions of the spleen stomach are grossly unremarkable.   Musculoskeletal: Degenerative changes in the spine. No worrisome lytic or sclerotic lesions.   IMPRESSION: 1. Pulmonary parenchymal pattern of fibrosis, as detailed above, similar to most recent prior exam but progressive from 09/06/2017. Findings are categorized as probable UIP per consensus guidelines: Diagnosis of Idiopathic Pulmonary Fibrosis: An Official ATS/ERS/JRS/ALAT Clinical Practice Guideline. [place], Iss 5, [phone], Sep 23 2016. 2. Steatotic cirrhotic liver. 3. Aortic atherosclerosis (ICD10-I70.0). Coronary artery calcification. 4.  Emphysema (ICD10-J43.9).     Electronically Signed   By: Lorin Picket M.D.   On: 01/06/2022 14:19       has a past medical history of Alcoholism ([place]), Anxiety, Arthritis, Coronary artery calcification seen on CAT scan (04/08/2018), Dyspnea, HTN (hypertension) (04/08/2018), Hypercholesteremia, Hypertension, Interstitial lung disease ([place]), Kidney stones (2020), and Pulmonary fibrosis ([place]).   reports that he has been smoking cigarettes. He has a 60.00 pack-year smoking history. He has never used smokeless tobacco.  Past Surgical History:  Procedure Laterality Date   KNEE CARTILAGE SURGERY Left    LUNG BIOPSY Right 12/06/2017   Procedure: LUNG BIOPSY;  Surgeon: Melrose Nakayama, MD;  Location: [facility];  Service: Thoracic;  Laterality: Right;   VIDEO ASSISTED THORACOSCOPY Right 12/06/2017   Procedure: VIDEO ASSISTED THORACOSCOPY;  Surgeon: Melrose Nakayama, MD;  Location: [facility];  Service:  Thoracic;  Laterality: Right;    Allergies  Allergen Reactions   Bee Venom Anaphylaxis, Hives and Rash   Spider Antivenin [S Black Widow (Latrodec Mactans) Antivenin] Anaphylaxis, Hives and Rash    Immunization History  Administered Date(s) Administered   Influenza,inj,Quad PF,6+ Mos 09/26/2017, 09/01/2018, 10/22/2020   Influenza-Unspecified 12/04/2021   Pneumococcal Conjugate-13 11/22/2017   Pneumococcal Polysaccharide-23 01/01/2020   Zoster, Live 06/24/2019    Family History  Problem Relation Age of Onset   Cirrhosis Father    Colon cancer Neg Hx    Stomach cancer Neg Hx    Esophageal cancer Neg Hx    Inflammatory bowel disease Neg Hx    Liver disease Neg Hx    Pancreatic cancer Neg Hx    Rectal cancer Neg Hx      Current Outpatient Medications:    amLODipine (NORVASC) 5 MG tablet, TAKE 1 TABLET BY MOUTH EVERY DAY, Disp: 90 tablet, Rfl: 0   atorvastatin (LIPITOR) 80 MG tablet, Take 80 mg by mouth daily., Disp: , Rfl: 1   cyclobenzaprine (FLEXERIL) 10 MG tablet, Take 1 tablet (10 mg total) by mouth 2 (two) times daily as needed for muscle spasms., Disp: 20 tablet, Rfl: 0   diphenhydrAMINE (BENADRYL) 25 MG tablet, Take 25 mg by mouth daily as needed for itching. , Disp: , Rfl:    EPINEPHrine 0.3 mg/0.3 mL IJ SOAJ injection, Use once.  If ineffective, use 2nd dose., Disp: 2 Device, Rfl: 0   etanercept (ENBREL) 50 MG/ML injection, Inject 50 mg into the skin once a week., Disp: , Rfl:    hydrochlorothiazide (HYDRODIURIL) 12.5 MG tablet, TAKE 1 TABLET BY MOUTH EVERY DAY, Disp: 90 tablet, Rfl: 1   losartan (COZAAR) 100 MG tablet, TAKE 1 TABLET BY MOUTH EVERY DAY, Disp: 90 tablet, Rfl: 1   nadolol (CORGARD) 20 MG tablet, Take 1 tablet (20 mg total) by mouth daily., Disp: 30 tablet, Rfl: 6   Omega-3 Fatty Acids (FISH OIL PO), Take 2 capsules by mouth 2 (two) times daily. , Disp: , Rfl:    omeprazole (PRILOSEC) 20 MG capsule, Take 20 mg by mouth daily., Disp: , Rfl:        Objective:   Vitals:   02/14/22 1054  BP: 122/64  Pulse: 74  Temp: 98.7 F (37.1 C)  TempSrc: Oral  SpO2: 96%  Weight: 211 lb (95.7 kg)  Height: 5' 9.25" (1.759 m)    Estimated body mass index is 30.93 kg/m as calculated from the following:   Height as of this encounter: 5' 9.25" (1.759 m).   Weight as of this encounter: 211 lb (95.7 kg).  '@WEIGHTCHANGE'$ @  [employer]   02/14/22 1054  Weight: 211 lb (95.7 kg)     Physical Exam  General: No distress. Looks well Neuro: Alert and Oriented x 3. GCS 15. Speech normal Psych: Pleasant Resp:  Barrel Chest - no.  Wheeze - no, Crackles - no, No overt respiratory distress CVS: Normal heart sounds. Murmurs - non Ext: Stigmata of Connective Tissue Disease - no HEENT: Normal upper airway. PEERL +. No post nasal drip        Assessment:       ICD-10-CM   1. Interstitial pulmonary disease ([place])  J84.9     2. Moderate cigarette smoker (10-19 per day)  F17.210     3. Vaccine counseling  Z71.85          Plan:     Patient Instructions  ILD (interstitial lung disease) ([place])  -Currently stable and this is due to smoking call smoking-related interstitial fibrosis [S RIF] but ongoung smoking and rheumatoid can make it worse   - supportive care is the plan given stability  Plan -6 months do spirometry and dlco - return in 6 months - any worsening can consider anti-fibrotic  Mild pulmonary emphysema on CT dec 2023  Plan  - albuterol as needed - quit smoking   Moderate cigarette  smoker (10-19 per day)  -Too bad this has been relapsed but glad you are only smoking some puffs and trying to quit - continued smoking is risk factor for fibrosis getting worse  Plan --I hope you quit smoking in the next few months   Vaccine counseling  Plan  - recommend RSV vaccine from [facility] - recommend Please talk to PCP Collene Leyden, MD -  and ensure you get  shingrix ([place]) inactivated vaccine against  shingles    Alcoholism and cirrhosis liver  Glad you are seeing [facility]  Plan  - continue care through them   Follow-up -6 months dodo spirometry and DLCO -Return to see Dr. [name] for a 30-minute face-to-face visit in ILD clinic or any day in 6 months after the test  Moderate Complexity MDM NEW OFFICE  The table below is from the 2021 E/M guidelines, first released in 2021, with minor revisions added in 2023. Must meet the requirements for 2 out of 3 dimensions to qualify.    Number and complexity of problems addressed Amount and/or complexity of data reviewed Risk of complications and/or morbidity  One or more chronic illness with mild exacerbation, progression, or side effects of treatment  Two or more stable chronic illnesses  One undiagnosed new problem with uncertain prognosis  One acute illness with systemic symptoms   Acute complicated injury Must meet the requirements for 1 of 3 of the categories)  Category 1: Tests and documents, historian  Any combination of 3 of the following:  Assessment requiring an independent historian  Review of prior external records  Review of results of each unique test  Ordering of each unique test    Category 2: Interpretation of tests  Independent interpretation of a test perfromed by another physician/NPP  Category 3: Discuss management/tests  Discussion of magagement or tests with an external physician/NPP Prescription drug management  Decision regarding minor surgery with identfied patient or procedure risk factors  Decision regarding elective major surgery without identified patient or procedure risk factors  Diagnosis or treatment significantly limited by social determinants of health                SIGNATURE    Dr. Brand Males, M.D., F.C.C.P,  Pulmonary and Critical Care Medicine Staff Physician, [place] Director - Interstitial Lung Disease  Program  Pulmonary  [place] [facility] [place], [place], [address]  Pager: [phone], If no answer or between  15:00h - 7:00h: call [phone] Telephone: [phone]  11:38 AM 02/14/2022

## 2022-02-14 NOTE — Patient Instructions (Signed)
Spirometry/DLCO performed today.

## 2022-02-14 NOTE — Progress Notes (Signed)
Spirometry/DLCO performed today.

## 2022-02-14 NOTE — Patient Instructions (Addendum)
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  Plan  - albuterol as needed - quit smoking   Moderate cigarette smoker (10-19 per day)  -Too bad this has been relapsed but glad you are only smoking some puffs and trying to quit - continued smoking is risk factor for fibrosis getting worse  Plan --I hope you quit smoking in the next few months   Vaccine counseling  Plan  - recommend RSV vaccine from commercial pharmacy - recommend Please talk to PCP Robert Leyden, MD -  and ensure you get  shingrix ([place]) inactivated vaccine against shingles    Alcoholism and cirrhosis liver  Glad you are seeing [facility]  Plan  - continue care through them   Follow-up -6 months dodo spirometry and DLCO -Return to see Dr. Chase [facility] for a 30-minute face-to-face visit in ILD clinic or any day in 6 months after the test

## 2022-02-27 DIAGNOSIS — K625 Hemorrhage of anus and rectum: Secondary | ICD-10-CM | POA: Diagnosis not present

## 2022-03-15 ENCOUNTER — Other Ambulatory Visit: Payer: Self-pay | Admitting: Gastroenterology

## 2022-03-20 DIAGNOSIS — K760 Fatty (change of) liver, not elsewhere classified: Secondary | ICD-10-CM | POA: Diagnosis not present

## 2022-03-20 DIAGNOSIS — E669 Obesity, unspecified: Secondary | ICD-10-CM | POA: Diagnosis not present

## 2022-03-20 DIAGNOSIS — R69 Illness, unspecified: Secondary | ICD-10-CM | POA: Diagnosis not present

## 2022-03-20 DIAGNOSIS — M069 Rheumatoid arthritis, unspecified: Secondary | ICD-10-CM | POA: Diagnosis not present

## 2022-03-20 DIAGNOSIS — J8410 Pulmonary fibrosis, unspecified: Secondary | ICD-10-CM | POA: Diagnosis not present

## 2022-03-20 DIAGNOSIS — Z87892 Personal history of anaphylaxis: Secondary | ICD-10-CM | POA: Diagnosis not present

## 2022-03-20 DIAGNOSIS — I10 Essential (primary) hypertension: Secondary | ICD-10-CM | POA: Diagnosis not present

## 2022-03-20 DIAGNOSIS — K219 Gastro-esophageal reflux disease without esophagitis: Secondary | ICD-10-CM | POA: Diagnosis not present

## 2022-03-20 DIAGNOSIS — Z79620 Long term (current) use of immunosuppressive biologic: Secondary | ICD-10-CM | POA: Diagnosis not present

## 2022-03-20 DIAGNOSIS — E785 Hyperlipidemia, unspecified: Secondary | ICD-10-CM | POA: Diagnosis not present

## 2022-03-20 DIAGNOSIS — Z87891 Personal history of nicotine dependence: Secondary | ICD-10-CM | POA: Diagnosis not present

## 2022-03-20 DIAGNOSIS — Z6830 Body mass index (BMI) 30.0-30.9, adult: Secondary | ICD-10-CM | POA: Diagnosis not present

## 2022-03-23 DIAGNOSIS — M069 Rheumatoid arthritis, unspecified: Secondary | ICD-10-CM | POA: Diagnosis not present

## 2022-03-23 DIAGNOSIS — J449 Chronic obstructive pulmonary disease, unspecified: Secondary | ICD-10-CM | POA: Diagnosis not present

## 2022-03-23 DIAGNOSIS — G4700 Insomnia, unspecified: Secondary | ICD-10-CM | POA: Diagnosis not present

## 2022-03-23 DIAGNOSIS — E7800 Pure hypercholesterolemia, unspecified: Secondary | ICD-10-CM | POA: Diagnosis not present

## 2022-03-23 DIAGNOSIS — K219 Gastro-esophageal reflux disease without esophagitis: Secondary | ICD-10-CM | POA: Diagnosis not present

## 2022-03-23 DIAGNOSIS — I10 Essential (primary) hypertension: Secondary | ICD-10-CM | POA: Diagnosis not present

## 2022-04-03 DIAGNOSIS — H5203 Hypermetropia, bilateral: Secondary | ICD-10-CM | POA: Diagnosis not present

## 2022-04-03 DIAGNOSIS — Z135 Encounter for screening for eye and ear disorders: Secondary | ICD-10-CM | POA: Diagnosis not present

## 2022-04-03 DIAGNOSIS — H2513 Age-related nuclear cataract, bilateral: Secondary | ICD-10-CM | POA: Diagnosis not present

## 2022-04-03 DIAGNOSIS — H52223 Regular astigmatism, bilateral: Secondary | ICD-10-CM | POA: Diagnosis not present

## 2022-04-03 DIAGNOSIS — H524 Presbyopia: Secondary | ICD-10-CM | POA: Diagnosis not present

## 2022-04-11 DIAGNOSIS — I779 Disorder of arteries and arterioles, unspecified: Secondary | ICD-10-CM | POA: Diagnosis not present

## 2022-04-11 DIAGNOSIS — I700 Atherosclerosis of aorta: Secondary | ICD-10-CM | POA: Diagnosis not present

## 2022-04-11 DIAGNOSIS — R69 Illness, unspecified: Secondary | ICD-10-CM | POA: Diagnosis not present

## 2022-04-11 DIAGNOSIS — N529 Male erectile dysfunction, unspecified: Secondary | ICD-10-CM | POA: Diagnosis not present

## 2022-04-11 DIAGNOSIS — R7301 Impaired fasting glucose: Secondary | ICD-10-CM | POA: Diagnosis not present

## 2022-04-11 DIAGNOSIS — J449 Chronic obstructive pulmonary disease, unspecified: Secondary | ICD-10-CM | POA: Diagnosis not present

## 2022-04-11 DIAGNOSIS — M069 Rheumatoid arthritis, unspecified: Secondary | ICD-10-CM | POA: Diagnosis not present

## 2022-04-11 DIAGNOSIS — Z0000 Encounter for general adult medical examination without abnormal findings: Secondary | ICD-10-CM | POA: Diagnosis not present

## 2022-04-11 DIAGNOSIS — I10 Essential (primary) hypertension: Secondary | ICD-10-CM | POA: Diagnosis not present

## 2022-04-11 DIAGNOSIS — E7800 Pure hypercholesterolemia, unspecified: Secondary | ICD-10-CM | POA: Diagnosis not present

## 2022-04-11 DIAGNOSIS — Z125 Encounter for screening for malignant neoplasm of prostate: Secondary | ICD-10-CM | POA: Diagnosis not present

## 2022-04-11 DIAGNOSIS — R7309 Other abnormal glucose: Secondary | ICD-10-CM | POA: Diagnosis not present

## 2022-04-11 LAB — LAB REPORT - SCANNED
A1c: 6.3
EGFR: 106

## 2022-04-19 ENCOUNTER — Encounter: Payer: Self-pay | Admitting: Gastroenterology

## 2022-04-19 NOTE — Progress Notes (Signed)
Review of outside records These are from 04/11/2022  Hemoglobin A1c 6.3 Hemoglobin/hematocrit 13.9/42.2 WBC 5.1 Platelets 64 MCV 89.5 AST/ALT 60/34 Alk phos 88 T. bili 1.4 Total protein 7.3 Albumin 4.0 Sodium 141 Potassium 3.8 BUN/creatinine 8/0.66 Triglycerides 70 Total cholesterol 139

## 2022-05-10 ENCOUNTER — Ambulatory Visit: Payer: Medicare HMO | Admitting: Gastroenterology

## 2022-05-10 ENCOUNTER — Encounter: Payer: Self-pay | Admitting: Gastroenterology

## 2022-05-10 ENCOUNTER — Other Ambulatory Visit (INDEPENDENT_AMBULATORY_CARE_PROVIDER_SITE_OTHER): Payer: Medicare HMO

## 2022-05-10 VITALS — BP 112/70 | HR 64 | Ht 70.0 in | Wt 199.4 lb

## 2022-05-10 DIAGNOSIS — K7031 Alcoholic cirrhosis of liver with ascites: Secondary | ICD-10-CM

## 2022-05-10 DIAGNOSIS — Z86010 Personal history of colonic polyps: Secondary | ICD-10-CM

## 2022-05-10 DIAGNOSIS — Z1211 Encounter for screening for malignant neoplasm of colon: Secondary | ICD-10-CM | POA: Diagnosis not present

## 2022-05-10 DIAGNOSIS — K3189 Other diseases of stomach and duodenum: Secondary | ICD-10-CM | POA: Diagnosis not present

## 2022-05-10 DIAGNOSIS — R69 Illness, unspecified: Secondary | ICD-10-CM | POA: Diagnosis not present

## 2022-05-10 LAB — COMPREHENSIVE METABOLIC PANEL
ALT: 51 U/L (ref 0–53)
AST: 100 U/L — ABNORMAL HIGH (ref 0–37)
Albumin: 4.2 g/dL (ref 3.5–5.2)
Alkaline Phosphatase: 93 U/L (ref 39–117)
BUN: 10 mg/dL (ref 6–23)
CO2: 22 mEq/L (ref 19–32)
Calcium: 9.7 mg/dL (ref 8.4–10.5)
Chloride: 106 mEq/L (ref 96–112)
Creatinine, Ser: 0.77 mg/dL (ref 0.40–1.50)
GFR: 96.28 mL/min (ref >60.00)
Glucose, Bld: 129 mg/dL — ABNORMAL HIGH (ref 70–99)
Potassium: 4.2 mEq/L (ref 3.5–5.1)
Sodium: 138 mEq/L (ref 135–145)
Total Bilirubin: 1 mg/dL (ref 0.2–1.2)
Total Protein: 7.8 g/dL (ref 6.0–8.3)

## 2022-05-10 LAB — CBC
HCT: 44.9 % (ref 39.0–52.0)
Hemoglobin: 15.1 g/dL (ref 13.0–17.0)
MCHC: 33.6 g/dL (ref 30.0–36.0)
MCV: 90 fl (ref 78.0–100.0)
Platelets: 82 10*3/uL — ABNORMAL LOW (ref 150.0–400.0)
RBC: 4.99 Mil/uL (ref 4.22–5.81)
RDW: 17.4 % — ABNORMAL HIGH (ref 11.5–15.5)
WBC: 7.1 10*3/uL (ref 4.0–10.5)

## 2022-05-10 LAB — PROTIME-INR
INR: 1.1 ratio — ABNORMAL HIGH (ref 0.8–1.0)
Prothrombin Time: 11.5 s (ref 9.6–13.1)

## 2022-05-10 MED ORDER — NADOLOL 20 MG PO TABS: 20.0000 mg | ORAL_TABLET | Freq: Every day | ORAL | 2 refills | Status: DC

## 2022-05-10 MED ORDER — OMEPRAZOLE 20 MG PO CPDR: 20.0000 mg | DELAYED_RELEASE_CAPSULE | Freq: Every day | ORAL | 11 refills | Status: DC

## 2022-05-10 NOTE — Patient Instructions (Signed)
Your provider has requested that you go to the basement level for lab work before leaving today. Press "B" on the elevator. The lab is located at the first door on the left as you exit the elevator.  We have sent the following medications to your pharmacy for you to pick up at your convenience: Nadolol , Omeprazole   _______________________________________________________  If your blood pressure at your visit was 140/90 or greater, please contact your primary care physician to follow up on this.  _______________________________________________________  If you are age 16 or older, your body mass index should be between 23-30. Your Body mass index is 28.61 kg/m. If this is out of the aforementioned range listed, please consider follow up with your Primary Care Provider.  If you are age 79 or younger, your body mass index should be between 19-25. Your Body mass index is 28.61 kg/m. If this is out of the aformentioned range listed, please consider follow up with your Primary Care Provider.   ________________________________________________________  The [facility] providers would like to encourage you to use [facility] to communicate with providers for non-urgent requests or questions.  Due to long hold times on the telephone, sending your provider a message by [facility] may be a faster and more efficient way to get a response.  Please allow 48 business hours for a response.  Please remember that this is for non-urgent requests.  _______________________________________________________  Thank you for choosing me and [facility].  Dr. Meridee Score

## 2022-05-10 NOTE — Progress Notes (Signed)
GASTROENTEROLOGY OUTPATIENT CLINIC VISIT   Primary Care Provider Irven Coe, MD [address] [place] [address] [place] [address] [phone]  Patient Profile: [facility] is a 62 y.o. male with a pmh significant for CAD, hypertension, hyperlipidemia, nephrolithiasis, rheumatoid arthritis, alcoholic cirrhosis (complicated by portal hypertension manifested as EVs, PHG, Thrombocytopenia), colon polyps (TAs), duodenal SEL.  The patient presents to the [facility] for an evaluation and management of problem(s) noted below:  Problem List 1. Alcoholic cirrhosis of liver with ascites   2. Colon cancer screening   3. Duodenal nodule   4. History of colon polyps     History of Present Illness Please see prior notes for full details of HPI.  Interval History The patient returns for follow-up.  He is unaccompanied today.  He has had some intentional weight loss.  He feels and is doing well.  He has not had any changes or new abdominal pain.    The patient denies any issues with jaundice, scleral icterus, generalized pruritus, darkened/amber urine, clay-colored stools, LE edema, hematemesis, coffee-ground emesis, abdominal distention, confusion.  GI Review of Systems Positive as above Negative for dysphagia, odynophagia, nausea, vomiting, pain, alteration of bowel habits, melena, hematochezia   Review of Systems General: Denies fevers/chills/weight loss unintentionally Cardiovascular: Denies chest pain Pulmonary: Denies shortness of breath Gastroenterological: See HPI Genitourinary: Denies darkened urine Hematological: Denies easy bruising/bleeding Dermatological: Denies jaundice Psychological: Mood is stable   Medications Current Outpatient Medications  Medication Sig Dispense Refill   amLODipine (NORVASC) 5 MG tablet TAKE 1 TABLET BY MOUTH EVERY DAY 90 tablet 0   atorvastatin (LIPITOR) 80 MG tablet Take 80 mg by mouth daily.  1   cyclobenzaprine  (FLEXERIL) 10 MG tablet Take 1 tablet (10 mg total) by mouth 2 (two) times daily as needed for muscle spasms. 20 tablet 0   diphenhydrAMINE (BENADRYL) 25 MG tablet Take 25 mg by mouth daily as needed for itching.      EPINEPHrine 0.3 mg/0.3 mL IJ SOAJ injection Use once.  If ineffective, use 2nd dose. 2 Device 0   etanercept (ENBREL) 50 MG/ML injection Inject 50 mg into the skin once a week.     hydrochlorothiazide (HYDRODIURIL) 12.5 MG tablet TAKE 1 TABLET BY MOUTH EVERY DAY 90 tablet 1   losartan (COZAAR) 100 MG tablet TAKE 1 TABLET BY MOUTH EVERY DAY 90 tablet 1   Omega-3 Fatty Acids (FISH OIL PO) Take 2 capsules by mouth 2 (two) times daily.      nadolol (CORGARD) 20 MG tablet Take 1 tablet (20 mg total) by mouth daily. 90 tablet 2   omeprazole (PRILOSEC) 20 MG capsule Take 1 capsule (20 mg total) by mouth daily. 30 capsule 11   No current facility-administered medications for this visit.    Allergies Allergies  Allergen Reactions   Bee Venom Anaphylaxis, Hives and Rash   Spider Antivenin [S Black Widow (Latrodec Mactans) Antivenin] Anaphylaxis, Hives and Rash    Histories Past Medical History:  Diagnosis Date   Alcoholism    Anxiety    Arthritis    rheumatoid   Coronary artery calcification seen on CAT scan 04/08/2018   Dyspnea    HTN (hypertension) 04/08/2018   Hypercholesteremia    Hypertension    Interstitial lung disease    Kidney stones 2020   Pulmonary fibrosis    Past Surgical History:  Procedure Laterality Date   KNEE CARTILAGE SURGERY Left    LUNG BIOPSY Right 12/06/2017   Procedure:  LUNG BIOPSY;  Surgeon: Loreli Slot, MD;  Location: [facility] OR;  Service: Thoracic;  Laterality: Right;   VIDEO ASSISTED THORACOSCOPY Right 12/06/2017   Procedure: VIDEO ASSISTED THORACOSCOPY;  Surgeon: Loreli Slot, MD;  Location: [facility] OR;  Service: Thoracic;  Laterality: Right;   Social History   Socioeconomic History   Marital status: Widowed    Spouse name: Not  on file   Number of children: 2   Years of education: Not on file   Highest education level: Not on file  Occupational History   Not on file  Tobacco Use   Smoking status: Every Day    Packs/day: 1.50    Years: 40.00    Additional pack years: 0.00    Total pack years: 60.00    Types: Cigarettes   Smokeless tobacco: Never   Tobacco comments:    Pt states he does smoke every now and then but not much. 02/14/22 ALS   Vaping Use   Vaping Use: Every day  Substance and Sexual Activity   Alcohol use: Yes    Alcohol/week: 1.0 standard drink of alcohol    Types: 1 Standard drinks or equivalent per week    Comment: daily, about 3 to 5 a day   Drug use: No   Sexual activity: Yes    Birth control/protection: None  Other Topics Concern   Not on file  Social History Narrative   Not on file   Social Determinants of Health   Financial Resource Strain: Not on file  Food Insecurity: Not on file  Transportation Needs: Not on file  Physical Activity: Not on file  Stress: Not on file  Social Connections: Not on file  Intimate Partner Violence: Not on file   Family History  Problem Relation Age of Onset   Cirrhosis Father    Colon cancer Neg Hx    Stomach cancer Neg Hx    Esophageal cancer Neg Hx    Inflammatory bowel disease Neg Hx    Liver disease Neg Hx    Pancreatic cancer Neg Hx    Rectal cancer Neg Hx    I have reviewed his medical, social, and family history in detail and updated the electronic medical record as necessary.    PHYSICAL EXAMINATION  BP 112/70   Pulse 64   Ht  (1.778 m)   Wt 199 lb 6.4 oz (90.4 kg)   BMI 28.61 kg/m  Wt Readings from Last 3 Encounters:  05/10/22 199 lb 6.4 oz (90.4 kg)  02/14/22 211 lb (95.7 kg)  11/08/21 210 lb 2 oz (95.3 kg)  GEN: NAD, appears older than stated age, doesn't appear chronically ill, accompanied by daughter PSYCH: Cooperative, without pressured speech EYE: Conjunctivae pale-pink, sclerae anicteric ENT: MMM CV:  Nontachycardic RESP: No audible wheezing GI: NABS, soft, protuberant abdomen, rounded, obese, NT, no rebound MSK/EXT: 1/2+ pedal edema bilaterally (looks well currently) SKIN: Positive for spider angiomata on upper thorax; no jaundice NEURO:  Alert & Oriented x 3, no focal deficits, no evidence of asterixis   REVIEW OF DATA  I reviewed the following data at the time of this encounter:  GI Procedures and Studies  Previous EGD/colonoscopy were reviewed with the patient and daughter  Laboratory Studies  Reviewed those in epic  Imaging Studies  No new imaging studies to review   ASSESSMENT  Mr. Bedel is a 62 y.o. male with a pmh significant for CAD, hypertension, hyperlipidemia, nephrolithiasis, rheumatoid arthritis, alcoholic cirrhosis (complicated by portal hypertension  manifested as EVs, PHG, Thrombocytopenia), colon polyps (TAs), duodenal SEL.  The patient presents to the [facility] for an evaluation and management of problem(s) noted below:  1. Alcoholic cirrhosis of liver with ascites   2. Colon cancer screening   3. Duodenal nodule   4. History of colon polyps    The patient is hemodynamically and clinically stable.  He remains in a compensated cirrhotic status.  He still wants time to think about hepatitis B immunization, since he does not put himself into the risk factors for getting this infection.  Hopefully he will continue to work on his alcohol consumption.  Later this year we need to perform an upper endoscopic ultrasound to evaluate the subepithelial lesion/nodule of the duodenum and also for his poor preparation of the colon perform a colonoscopy repeat.  We will keep him at his current beta-blocker therapy. The patient is hemodynamically and clinically stable at this time.  He remains in a compensated status.  He seems to have tolerated his beta-blocker at this time and will not have much room in his heart rate to alter things so we will keep him  where he is.  He was offered hepatitis B vaccination/immunization but wants time to think about this (does not put himself at risk factors for getting this infection at this time).  He needs to continue to work on alcohol cessation.  He needs a repeat colonoscopic evaluation due to a poor preparation and we will do that at the same time as an upper endoscopic ultrasound to evaluate the duodenal SEL/nodule that was found during EGD.  He is due for [facility] screening and we will get that scheduled in the coming weeks.  All patient questions were answered to the best of my ability, and the patient agrees to the aforementioned plan of action with follow-up as indicated.   PLAN  Volume -Obtain standing weights at least 3 times weekly - 1500 mg Na diet Infection -No need for prophylaxis as no evidence of ascites Bleeding -EVs in 2023 due for repeat in 2026 -On NSBB  Encephalopathy -None Screening -UTD as of 10/23 but needs updated for 24 Transplant -We have not broached this idea as of yet Vaccination -He has been offered HBV vaccination, he continues to think on this Other -Promote intake of 1.5 g/kg/day of Protein (Ensures/Boosts at each meal) -Complete alcohol cessation recommended again -Upper endoscopic ultrasound evaluation for duodenal nodule this year -Repeat colonoscopy for inadequate preparation this year  -Laboratories as outlined below   Orders Placed This Encounter  Procedures   US ABDOMEN LIMITED WITH LIVER DOPPLER   CBC   Comp Met (CMET)   INR/PT   AFP tumor marker    New Prescriptions   No medications on file   Modified Medications   Modified Medication Previous Medication   NADOLOL (CORGARD) 20 MG TABLET nadolol (CORGARD) 20 MG tablet      Take 1 tablet (20 mg total) by mouth daily.    TAKE 1 TABLET BY MOUTH EVERY DAY   OMEPRAZOLE (PRILOSEC) 20 MG CAPSULE omeprazole (PRILOSEC) 20 MG capsule      Take 1 capsule (20 mg total) by mouth daily.    Take 20 mg by mouth  daily.    Planned Follow Up Return in about 6 months (around 11/09/2022).   Total Time in Face-to-Face and in Coordination of Care for patient including independent/personal interpretation/review of prior testing, medical history, examination, medication adjustment, communicating results with the patient directly, and  documentation within the EHR is 25 minutes.   Corliss Parish, MD [facility] Advanced Endoscopy Office # [phone]

## 2022-05-11 LAB — AFP TUMOR MARKER: AFP-Tumor Marker: 5.1 ng/mL (ref <6.1)

## 2022-05-13 ENCOUNTER — Encounter: Payer: Self-pay | Admitting: Gastroenterology

## 2022-05-13 DIAGNOSIS — K7031 Alcoholic cirrhosis of liver with ascites: Secondary | ICD-10-CM | POA: Insufficient documentation

## 2022-05-13 DIAGNOSIS — K7030 Alcoholic cirrhosis of liver without ascites: Secondary | ICD-10-CM | POA: Insufficient documentation

## 2022-05-13 DIAGNOSIS — Z1211 Encounter for screening for malignant neoplasm of colon: Secondary | ICD-10-CM | POA: Insufficient documentation

## 2022-05-17 ENCOUNTER — Telehealth: Payer: Self-pay

## 2022-05-17 DIAGNOSIS — M25461 Effusion, right knee: Secondary | ICD-10-CM | POA: Diagnosis not present

## 2022-05-17 DIAGNOSIS — Z7952 Long term (current) use of systemic steroids: Secondary | ICD-10-CM | POA: Diagnosis not present

## 2022-05-17 DIAGNOSIS — Z6828 Body mass index (BMI) 28.0-28.9, adult: Secondary | ICD-10-CM | POA: Diagnosis not present

## 2022-05-17 DIAGNOSIS — I7300 Raynaud's syndrome without gangrene: Secondary | ICD-10-CM | POA: Diagnosis not present

## 2022-05-17 DIAGNOSIS — K7031 Alcoholic cirrhosis of liver with ascites: Secondary | ICD-10-CM

## 2022-05-17 DIAGNOSIS — E663 Overweight: Secondary | ICD-10-CM | POA: Diagnosis not present

## 2022-05-17 DIAGNOSIS — M0609 Rheumatoid arthritis without rheumatoid factor, multiple sites: Secondary | ICD-10-CM | POA: Diagnosis not present

## 2022-05-17 DIAGNOSIS — J849 Interstitial pulmonary disease, unspecified: Secondary | ICD-10-CM | POA: Diagnosis not present

## 2022-05-17 DIAGNOSIS — R7989 Other specified abnormal findings of blood chemistry: Secondary | ICD-10-CM | POA: Diagnosis not present

## 2022-05-17 DIAGNOSIS — M353 Polymyalgia rheumatica: Secondary | ICD-10-CM | POA: Diagnosis not present

## 2022-05-17 NOTE — Telephone Encounter (Signed)
Left pt message to call back

## 2022-05-17 NOTE — Telephone Encounter (Signed)
-----   Message from Loretha Stapler, RN sent at 11/15/2021  9:12 AM EDT -----  88-month HCC recall liver ultrasound

## 2022-05-17 NOTE — Telephone Encounter (Signed)
Pt needs 6 month f/u US for liver screening

## 2022-05-18 NOTE — Telephone Encounter (Signed)
Spoke to pt. Pt agreed to [place] order entered and sent to schedulers

## 2022-09-04 DIAGNOSIS — M069 Rheumatoid arthritis, unspecified: Secondary | ICD-10-CM | POA: Diagnosis not present

## 2022-09-04 DIAGNOSIS — G629 Polyneuropathy, unspecified: Secondary | ICD-10-CM | POA: Diagnosis not present

## 2022-09-04 DIAGNOSIS — R7301 Impaired fasting glucose: Secondary | ICD-10-CM | POA: Diagnosis not present

## 2022-09-04 DIAGNOSIS — W19XXXA Unspecified fall, initial encounter: Secondary | ICD-10-CM | POA: Diagnosis not present

## 2022-09-04 DIAGNOSIS — K7460 Unspecified cirrhosis of liver: Secondary | ICD-10-CM | POA: Diagnosis not present

## 2022-09-04 DIAGNOSIS — Z043 Encounter for examination and observation following other accident: Secondary | ICD-10-CM | POA: Diagnosis not present

## 2022-09-06 DIAGNOSIS — H5203 Hypermetropia, bilateral: Secondary | ICD-10-CM | POA: Diagnosis not present

## 2022-09-06 DIAGNOSIS — H52223 Regular astigmatism, bilateral: Secondary | ICD-10-CM | POA: Diagnosis not present

## 2022-10-02 ENCOUNTER — Ambulatory Visit (HOSPITAL_COMMUNITY)
Admission: RE | Admit: 2022-10-02 | Discharge: 2022-10-02 | Disposition: A | Discharge status: Home / Self Care | Payer: Medicare HMO | Source: Ambulatory Visit | Attending: Gastroenterology | Admitting: Gastroenterology

## 2022-10-02 DIAGNOSIS — R945 Abnormal results of liver function studies: Secondary | ICD-10-CM | POA: Insufficient documentation

## 2022-10-02 DIAGNOSIS — K7689 Other specified diseases of liver: Secondary | ICD-10-CM | POA: Diagnosis not present

## 2022-10-02 DIAGNOSIS — K7031 Alcoholic cirrhosis of liver with ascites: Secondary | ICD-10-CM | POA: Diagnosis not present

## 2022-10-02 DIAGNOSIS — K769 Liver disease, unspecified: Secondary | ICD-10-CM | POA: Diagnosis not present

## 2022-10-02 DIAGNOSIS — K7460 Unspecified cirrhosis of liver: Secondary | ICD-10-CM | POA: Diagnosis not present

## 2022-10-20 ENCOUNTER — Ambulatory Visit: Payer: Medicare HMO | Admitting: Gastroenterology

## 2022-11-23 DIAGNOSIS — J849 Interstitial pulmonary disease, unspecified: Secondary | ICD-10-CM | POA: Diagnosis not present

## 2022-11-23 DIAGNOSIS — M353 Polymyalgia rheumatica: Secondary | ICD-10-CM | POA: Diagnosis not present

## 2022-11-23 DIAGNOSIS — Z7952 Long term (current) use of systemic steroids: Secondary | ICD-10-CM | POA: Diagnosis not present

## 2022-11-23 DIAGNOSIS — M170 Bilateral primary osteoarthritis of knee: Secondary | ICD-10-CM | POA: Diagnosis not present

## 2022-11-23 DIAGNOSIS — Z6828 Body mass index (BMI) 28.0-28.9, adult: Secondary | ICD-10-CM | POA: Diagnosis not present

## 2022-11-23 DIAGNOSIS — M0609 Rheumatoid arthritis without rheumatoid factor, multiple sites: Secondary | ICD-10-CM | POA: Diagnosis not present

## 2022-11-23 DIAGNOSIS — E663 Overweight: Secondary | ICD-10-CM | POA: Diagnosis not present

## 2022-11-23 DIAGNOSIS — Z111 Encounter for screening for respiratory tuberculosis: Secondary | ICD-10-CM | POA: Diagnosis not present

## 2022-11-23 DIAGNOSIS — M25461 Effusion, right knee: Secondary | ICD-10-CM | POA: Diagnosis not present

## 2022-11-23 DIAGNOSIS — I7300 Raynaud's syndrome without gangrene: Secondary | ICD-10-CM | POA: Diagnosis not present

## 2022-11-23 DIAGNOSIS — R7989 Other specified abnormal findings of blood chemistry: Secondary | ICD-10-CM | POA: Diagnosis not present

## 2022-11-28 LAB — LAB REPORT - SCANNED: EGFR: 99

## 2022-12-01 DIAGNOSIS — M25562 Pain in left knee: Secondary | ICD-10-CM | POA: Diagnosis not present

## 2022-12-01 DIAGNOSIS — E663 Overweight: Secondary | ICD-10-CM | POA: Diagnosis not present

## 2022-12-01 DIAGNOSIS — R7989 Other specified abnormal findings of blood chemistry: Secondary | ICD-10-CM | POA: Diagnosis not present

## 2022-12-01 DIAGNOSIS — Z6828 Body mass index (BMI) 28.0-28.9, adult: Secondary | ICD-10-CM | POA: Diagnosis not present

## 2022-12-01 DIAGNOSIS — Z7952 Long term (current) use of systemic steroids: Secondary | ICD-10-CM | POA: Diagnosis not present

## 2022-12-01 DIAGNOSIS — M170 Bilateral primary osteoarthritis of knee: Secondary | ICD-10-CM | POA: Diagnosis not present

## 2022-12-01 DIAGNOSIS — M353 Polymyalgia rheumatica: Secondary | ICD-10-CM | POA: Diagnosis not present

## 2022-12-01 DIAGNOSIS — I7300 Raynaud's syndrome without gangrene: Secondary | ICD-10-CM | POA: Diagnosis not present

## 2022-12-01 DIAGNOSIS — M0609 Rheumatoid arthritis without rheumatoid factor, multiple sites: Secondary | ICD-10-CM | POA: Diagnosis not present

## 2022-12-01 DIAGNOSIS — J849 Interstitial pulmonary disease, unspecified: Secondary | ICD-10-CM | POA: Diagnosis not present

## 2022-12-01 DIAGNOSIS — M25561 Pain in right knee: Secondary | ICD-10-CM | POA: Diagnosis not present

## 2022-12-03 ENCOUNTER — Encounter: Payer: Self-pay | Admitting: Gastroenterology

## 2022-12-03 NOTE — Progress Notes (Signed)
Review of outside records These will be scanned into the chart.  November 23, 2022 labs WBC 6.6 Hemoglobin/hematocrit 13.2/39.8 Platelets 80 MCV 96 ESR 30 CRP 2 Sodium 141 Potassium 4.0 BUN/creatinine 11/.82 Albumin 3.9 AST/ALT 129/52 Alkaline phosphatase 137 Total bili 1.5 QuantiFERON gold negative  His MELD score is calculated (although we do not have an INR) of being 9.  Grossly unchanged from prior. Will recommend repeat labs be drawn a week before his clinic visit in January.  CBC/CMP/INR.   Corliss Parish, MD [facility] Advanced Endoscopy Office # [phone]

## 2022-12-04 NOTE — Progress Notes (Signed)
Left message on machine to call back  

## 2022-12-05 NOTE — Progress Notes (Signed)
Left message on machine to call back  

## 2022-12-06 NOTE — Progress Notes (Signed)
Left message on machine to call back  Unable to reach the pt by phone- letter mailed

## 2022-12-15 ENCOUNTER — Other Ambulatory Visit: Payer: Self-pay | Admitting: Cardiology

## 2022-12-18 ENCOUNTER — Telehealth: Payer: Self-pay | Admitting: Gastroenterology

## 2022-12-18 NOTE — Telephone Encounter (Signed)
Calling in regards to results

## 2022-12-19 NOTE — Telephone Encounter (Signed)
The pt has been advised of the [place] results that were released to My Chart.  He has no further questions at this time.

## 2023-01-26 ENCOUNTER — Other Ambulatory Visit (INDEPENDENT_AMBULATORY_CARE_PROVIDER_SITE_OTHER): Payer: Medicare HMO

## 2023-01-26 ENCOUNTER — Encounter: Payer: Self-pay | Admitting: Gastroenterology

## 2023-01-26 ENCOUNTER — Ambulatory Visit: Payer: Medicare HMO | Admitting: Gastroenterology

## 2023-01-26 VITALS — BP 84/52 | HR 73 | Ht 69.0 in | Wt 195.0 lb

## 2023-01-26 DIAGNOSIS — I959 Hypotension, unspecified: Secondary | ICD-10-CM

## 2023-01-26 DIAGNOSIS — Z860100 Personal history of colon polyps, unspecified: Secondary | ICD-10-CM

## 2023-01-26 DIAGNOSIS — K7030 Alcoholic cirrhosis of liver without ascites: Secondary | ICD-10-CM | POA: Diagnosis not present

## 2023-01-26 DIAGNOSIS — K3189 Other diseases of stomach and duodenum: Secondary | ICD-10-CM

## 2023-01-26 DIAGNOSIS — F1090 Alcohol use, unspecified, uncomplicated: Secondary | ICD-10-CM | POA: Diagnosis not present

## 2023-01-26 DIAGNOSIS — F1020 Alcohol dependence, uncomplicated: Secondary | ICD-10-CM

## 2023-01-26 LAB — CBC
HCT: 40.1 % (ref 39.0–52.0)
Hemoglobin: 13.5 g/dL (ref 13.0–17.0)
MCHC: 33.7 g/dL (ref 30.0–36.0)
MCV: 97.5 fL (ref 78.0–100.0)
Platelets: 85 10*3/uL — ABNORMAL LOW (ref 150.0–400.0)
RBC: 4.12 Mil/uL — ABNORMAL LOW (ref 4.22–5.81)
RDW: 14.9 % (ref 11.5–15.5)
WBC: 6.8 10*3/uL (ref 4.0–10.5)

## 2023-01-26 LAB — PROTIME-INR
INR: 1.2 {ratio} — ABNORMAL HIGH (ref 0.8–1.0)
Prothrombin Time: 13.1 s (ref 9.6–13.1)

## 2023-01-26 LAB — COMPREHENSIVE METABOLIC PANEL
ALT: 32 U/L (ref 0–53)
AST: 71 U/L — ABNORMAL HIGH (ref 0–37)
Albumin: 3.8 g/dL (ref 3.5–5.2)
Alkaline Phosphatase: 105 U/L (ref 39–117)
BUN: 14 mg/dL (ref 6–23)
CO2: 20 meq/L (ref 19–32)
Calcium: 9.3 mg/dL (ref 8.4–10.5)
Chloride: 107 meq/L (ref 96–112)
Creatinine, Ser: 1.03 mg/dL (ref 0.40–1.50)
GFR: 77.73 mL/min (ref >60.00)
Glucose, Bld: 123 mg/dL — ABNORMAL HIGH (ref 70–99)
Potassium: 3.9 meq/L (ref 3.5–5.1)
Sodium: 137 meq/L (ref 135–145)
Total Bilirubin: 1.5 mg/dL — ABNORMAL HIGH (ref 0.2–1.2)
Total Protein: 7 g/dL (ref 6.0–8.3)

## 2023-01-26 NOTE — Patient Instructions (Signed)
 Your provider has requested that you go to the basement level for lab work before leaving today. Press B on the elevator. The lab is located at the first door on the left as you exit the elevator.   You will need RUQ ultrasound in March 2025. You will be contacted by [facility] Scheduling to schedule this ultrasound . If you have not heard from them within 1-2 weeks, please contact them at [phone] to schedule ultrasound.   STOP Nadolol  for now.   Check Blood Pressure 1-2 time daily. In the morning and 3 hrs after taking other blood pressure medications.   Please contact your Primary Care Physician and Cardiologist to follow-up on Low Blood Pressure.   Follow-up with our office in 3-4 months.   Your provider has requested that you go to the basement level for lab work before leaving today. Press B on the elevator. The lab is located at the first door on the left as you exit the elevator.  Due to recent changes in healthcare laws, you may see the results of your imaging and laboratory studies on MyChart before your provider has had a chance to review them.  We understand that in some cases there may be results that are confusing or concerning to you. Not all laboratory results come back in the same time frame and the provider may be waiting for multiple results in order to interpret others.  Please give us  48 hours in order for your provider to thoroughly review all the results before contacting the office for clarification of your results.   Thank you for choosing me and [facility].  Dr. Wilhel[place]ia    Alcohol Misuse and Dependence Information, Adult Alcohol is a widely available drug and people choose to drink alcohol in different amounts. Alcohol misuse and dependence can have a negative effect on your life. Alcohol misuse is when you use alcohol too much or too often. You may have a hard time setting a limit on the amount you drink. Alcohol dependence is when you  use alcohol consistently for a period of time, and your body changes as a result. Alcohol dependence can make it hard for you to stop drinking because you may start to feel sick or different when you do not drink alcohol. These symptoms are known as withdrawal. People who drink alcohol very often and in large amounts, may develop what is called an alcohol use disorder. How can alcohol misuse and dependence affect me? Drinking too much can lead to addiction. You may feel like you need alcohol to function normally. You may drink alcohol before work in the morning, during the day, or as soon as you get home from work in the evening. These actions can result in: Poor work performance. Job loss. Financial problems. Car crashes or criminal charges from driving after drinking alcohol. Problems in your relationships with friends and family. Losing the trust and respect of coworkers, friends, and family. Drinking heavily over a long period of time can permanently damage your body and brain, and can cause lifelong health issues, such as: Damage to your liver or pancreas. Heart problems, high blood pressure, or stroke. Certain cancers. Decreased ability to fight infections. Brain or nerve damage. Depression. Early death, also called premature death. If you are careless or you crave alcohol, it is easy to drink more than your body can handle (overdose). Alcohol overdose is a serious situation that requires hospitalization. It may lead to permanent injuries or death. What can increase  my risk? Having a family history of alcohol misuse. Having depression or other mental health conditions. Beginning to drink at an early age. Binge drinking often. Experiencing trauma, stress, and an unstable home life during childhood. Spending time with people who drink often. What actions can I take to prevent alcohol misuse and dependence? Do not drink alcohol if: Your health care provider tells you not to drink. You  are pregnant, may be pregnant, or are planning to become pregnant. If you drink alcohol: Limit how much you have to: 0-1 drink a day for women who are not pregnant. 0-2 drinks a day for men. Know how much alcohol is in your drink. In the [place] one drink equals one 12 oz bottle of beer (355 mL), one 5 oz glass of wine (148 mL), or one 1 oz glass of hard liquor (44 mL). If you think you have an alcohol dependency problem, decide to stop drinking. This can be very hard to do if you are used to frequently drinking alcohol. If you begin to have withdrawal symptoms, talk with your health care provider or a person that you trust. These symptoms may include anxiety, shaky hands, headache, nausea, sweating, or not being able to sleep. Choose to drink nonalcoholic beverages in social gatherings and places where there may be alcohol. Activity Spend more time on activities that you enjoy that do not involve alcohol, like hobbies or exercise. Find healthy ways to cope with stress, such as meditation or spending time with people you care about. General information Talk to your family, coworkers, and friends about supporting you in your efforts to stop drinking. If they drink, ask them not to drink around you. Spend more time with people who do not drink alcohol. If you think that you have an alcohol dependency problem: Tell friends or family about your concerns. Talk with your health care provider or another health professional about where to get help. Work with a paramedic and a network engineer. Consider joining a support group for people who struggle with alcohol misuse and dependence. Where to find support  Your health care provider. SMART Recovery: smartrecovery.org Local treatment centers or chemical dependency counselors. Local AA groups in your community: customizedrugs.fi Where to find more information Centers for Disease Control and Prevention: tonerpromos.no [employer] on Alcohol Abuse  and Alcoholism: backupsupply.hu Alcoholics Anonymous (AA): customizedrugs.fi Contact a health care provider if: You drank more or for longer than you intended on more than one occasion. You often drink to the point of vomiting or passing out. You have problems in your life due to drinking, but you continue to drink. You keep drinking even though you feel anxious, depressed, or have experienced memory loss. You have stopped doing the things you used to enjoy in order to drink. You have to drink more than you used to in order to get the effect you want. You experience anxiety, sweating, nausea, shakiness, and trouble sleeping when you try to stop drinking. Get help right away if: You have serious withdrawal symptoms, including: Confusion. Racing heart. High blood pressure. Fever. These symptoms may be an emergency. Get help right away. Call 911. Do not wait to see if the symptoms will go away. Do not drive yourself to the hospital. Also, get help right away if: You have thoughts about hurting yourself or others. Take one of these steps if you feel like you may hurt yourself or others, or have thoughts about taking your own life: Call 911. Call the [facility]  Suicide Prevention Lifeline at 779-578-8703 or 77. This is open 24 hours a day. Text the Crisis Text Line at 657-078-3551. Summary Alcohol misuse and dependence can have a negative effect on your life. Drinking too much or too often can lead to addiction. If you drink alcohol, limit how much you use. If you are having trouble keeping your drinking under control, find ways to change your behavior. Hobbies, calming activities, exercise, or support groups can help. If you feel you need help with changing your drinking habits, talk with your health care provider, a good friend, or a therapist, or go to a support group. This information is not intended to replace advice given to you by your health care provider. Make sure you discuss any questions you have  with your health care provider. Document Revised: 03/16/2021 Document Reviewed: 03/16/2021 Elsevier Patient Education  2024 [employer].

## 2023-01-26 NOTE — Progress Notes (Signed)
 GASTROENTEROLOGY OUTPATIENT CLINIC VISIT   Primary Care Provider [name], MD [address] [place] [address] [phone]  Patient Profile: Robert Greene is a 63 y.o. male with a pmh significant for CAD, hypertension, hyperlipidemia, nephrolithiasis, rheumatoid arthritis, continued alcohol use disorder, alcoholic cirrhosis (complicated by portal hypertension manifested as EVs, PHG, Thrombocytopenia), colon polyps (TAs), duodenal SEL.  The patient presents to the [facility] for an evaluation and management of problem(s) noted below:  Problem List 1. Alcoholic cirrhosis of liver without ascites (HCC)   2. Duodenal nodule   3. Alcohol use disorder, moderate, dependence (HCC)   4. Hypotension, unspecified hypotension type    Discussed the use of AI scribe software for clinical note transcription with the patient, who gave verbal consent to proceed.  History of Present Illness Please see prior notes for full details of HPI.  Interval History The patient presents for follow-up and is accompanied by his daughter.  He has recently been experiencing lower blood pressure and some dizziness.  He takes his blood pressure medications daily.  He does not monitor his blood pressure normally however, though he has a blood pressure monitor.  Today in clinic he has a lower blood pressure than normal.  He is currently on four different blood pressure medications, including amlodipine , hydrochlorothiazide , losartan , and nadolol  which we are using for variceal prophylaxis of bleeding.  He denies any new upper respiratory symptoms or feeling like he is coming down with a cold and does not feel his pulmonary fibrosis is any worse than normal.  The patient's arthritis has been fluctuating, with good and bad days. He reports that the last couple of days have been particularly bad.  Patient continues to drink alcohol unfortunately at times at least a six-pack of beer  and vodka daily.  He denies new progressive jaundice, pruritus, darkened urine, confusion.   GI Review of Systems Positive as above Negative for dysphagia, odynophagia, nausea, vomiting, pain, alteration of bowel habits, melena, hematochezia   Review of Systems General: Not discussed in clinic, but has had unintentional weight loss over the last 6 months as evidenced by his weight in clinic.  denies fevers/chills Cardiovascular: Denies chest pain Pulmonary: Denies shortness of breath Gastroenterological: See HPI Genitourinary: Denies darkened urine Hematological: Denies easy bruising/bleeding Dermatological: Denies jaundice Psychological: Mood is stable   Medications Current Outpatient Medications  Medication Sig Dispense Refill   amLODipine  (NORVASC ) 5 MG tablet TAKE 1 TABLET BY MOUTH EVERY DAY 90 tablet 0   atorvastatin  (LIPITOR ) 80 MG tablet Take 80 mg by mouth daily.  1   diphenhydrAMINE  (BENADRYL ) 25 MG tablet Take 25 mg by mouth daily as needed for itching.      EPINEPHrine  0.3 mg/0.3 mL IJ SOAJ injection Use once.  If ineffective, use 2nd dose. 2 Device 0   etanercept (ENBREL) 50 MG/ML injection Inject 50 mg into the skin once a week.     hydrochlorothiazide  (HYDRODIURIL ) 12.5 MG tablet TAKE 1 TABLET BY MOUTH EVERY DAY 90 tablet 1   losartan  (COZAAR ) 100 MG tablet TAKE 1 TABLET BY MOUTH EVERY DAY 90 tablet 1   nadolol  (CORGARD ) 20 MG tablet Take 1 tablet (20 mg total) by mouth daily. 90 tablet 2   Omega-3 Fatty Acids (FISH OIL PO) Take 2 capsules by mouth 2 (two) times daily.      omeprazole  (PRILOSEC) 20 MG capsule Take 1 capsule (20 mg total) by mouth daily. 30 capsule 11   cyclobenzaprine  (FLEXERIL )  10 MG tablet Take 1 tablet (10 mg total) by mouth 2 (two) times daily as needed for muscle spasms. (Patient not taking: Reported on 01/26/2023) 20 tablet 0   No current facility-administered medications for this visit.    Allergies Allergies  Allergen Reactions   Bee Venom  Anaphylaxis, Hives and Rash   Spider Antivenin [S Black Widow (Latrodec Mactans) Antivenin] Anaphylaxis, Hives and Rash    Histories Past Medical History:  Diagnosis Date   Alcoholism (HCC)    Anxiety    Arthritis    rheumatoid   Cirrhosis (HCC)    Coronary artery calcification seen on CAT scan 04/08/2018   Dyspnea    HTN (hypertension) 04/08/2018   Hypercholesteremia    Hypertension    Interstitial lung disease (HCC)    Kidney stones 2020   Pulmonary fibrosis (HCC)    Past Surgical History:  Procedure Laterality Date   KNEE CARTILAGE SURGERY Left    LUNG BIOPSY Right 12/06/2017   Procedure: LUNG BIOPSY;  Surgeon: Kerrin Elspeth BROCKS, MD;  Location: [facility] OR;  Service: Thoracic;  Laterality: Right;   VIDEO ASSISTED THORACOSCOPY Right 12/06/2017   Procedure: VIDEO ASSISTED THORACOSCOPY;  Surgeon: Kerrin Elspeth BROCKS, MD;  Location: [facility] OR;  Service: Thoracic;  Laterality: Right;   Social History   Socioeconomic History   Marital status: Widowed    Spouse name: Not on file   Number of children: 2   Years of education: Not on file   Highest education level: Not on file  Occupational History   Not on file  Tobacco Use   Smoking status: Every Day    Current packs/day: 1.50    Average packs/day: 1.5 packs/day for 40.0 years (60.0 ttl pk-yrs)    Types: Cigarettes   Smokeless tobacco: Never   Tobacco comments:    Pt states he does smoke every now and then but not much. 02/14/22 ALS   Vaping Use   Vaping status: Every Day  Substance and Sexual Activity   Alcohol use: Yes    Alcohol/week: 1.0 standard drink of alcohol    Types: 1 Standard drinks or equivalent per week    Comment: daily, about 3 to 5 beers plus 2 to 3 liquor   Drug use: No   Sexual activity: Yes    Birth control/protection: None  Other Topics Concern   Not on file  Social History Narrative   Not on file   Social Drivers of Health   Financial Resource Strain: Not on file  Food Insecurity: Not on  file  Transportation Needs: Not on file  Physical Activity: Not on file  Stress: Not on file  Social Connections: Not on file  Intimate Partner Violence: Not on file   Family History  Problem Relation Age of Onset   Cirrhosis Father    Colon cancer Neg Hx    Stomach cancer Neg Hx    Esophageal cancer Neg Hx    Inflammatory bowel disease Neg Hx    Liver disease Neg Hx    Pancreatic cancer Neg Hx    Rectal cancer Neg Hx    I have reviewed his medical, social, and family history in detail and updated the electronic medical record as necessary.    PHYSICAL EXAMINATION  BP (!) 84/52   Pulse 73   Ht 5' 9 (1.753 m)   Wt 195 lb (88.5 kg)   SpO2 95%   BMI 28.80 kg/m  Wt Readings from Last 3 Encounters:  01/26/23 195 lb (  88.5 kg)  05/10/22 199 lb 6.4 oz (90.4 kg)  02/14/22 211 lb (95.7 kg)  GEN: NAD, appears older than stated age, doesn't appear chronically ill, accompanied by daughter PSYCH: Cooperative, without pressured speech EYE: Conjunctivae pale-pink, sclerae anicteric ENT: MMM CV: Nontachycardic RESP: No audible wheezing GI: NABS, soft, protuberant abdomen, rounded, obese, NT, no rebound MSK/EXT: Trace bilateral pedal edema SKIN: Positive for spider angiomata on upper thorax; no jaundice NEURO:  Alert & Oriented x 3, no focal deficits, no evidence of asterixis   REVIEW OF DATA  I reviewed the following data at the time of this encounter:  GI Procedures and Studies  No new imaging studies to review  Laboratory Studies  Reviewed those in epic  Imaging Studies  September 2024 liver Doppler ultrasound IMPRESSION: 1. Diffuse increased echogenicity of the hepatic parenchyma is a nonspecific indicator of hepatocellular dysfunction, most commonly steatosis. No focal hepatic lesion. 2. Patent portal vein with appropriate direction of flow.   ASSESSMENT  Mr. Paver is a 63 y.o. male with a pmh significant for CAD, hypertension, hyperlipidemia, nephrolithiasis,  rheumatoid arthritis, continued alcohol use disorder, alcoholic cirrhosis (complicated by portal hypertension manifested as EVs, PHG, Thrombocytopenia), colon polyps (TAs), duodenal SEL.  The patient presents to the [facility] for an evaluation and management of problem(s) noted below:  1. Alcoholic cirrhosis of liver without ascites (HCC)   2. Duodenal nodule   3. Alcohol use disorder, moderate, dependence (HCC)   4. Hypotension, unspecified hypotension type    Today, the patient does have some clinical changes that are concerning from a known cirrhosis perspective.  Is hypotension and symptomatology is concerning in the setting of his multiple medications for blood pressure.  As such I am asking him to stop nadolol  for now.  He needs to follow-up with his primary care doctor and cardiologist to discuss whether further management of his other blood pressure medications may be possible such that we can restart nadolol  to decrease his risk of bleeding from his known varices.  Unfortunately he also has continued alcohol use disorder continues to pose threat for decompensation.  I have asked him to strongly consider cutting back or stopping completely (he says that he is able to do that on his own and does not need further help with that), though I think his daughter disagrees with that.  Will get a recheck laboratories today from a MELD standpoint but also have him have labs that will help his primary care doctor who he is seeing next week.  Will get his follow-up HCC screening in March.  If we cannot go back on nadolol  therapy, will need to consider repeat EGD for banding of his varices.  Also he has a duodenal nodule that it is recommended an upper endoscopic ultrasound at some point, which we will reevaluate when he is feeling better.  He does not want hepatitis B vaccination, although he was offered this once again.  I am worried that long-term continued alcohol use will lead to his  hospitalization in the future.  Will tell.     PLAN  Volume -Obtain standing weights at least 3 times weekly - 1500 mg Na diet Infection -No need for prophylaxis as no evidence of ascites Bleeding -EVs in 2023 due for repeat in 2026 -Stopping his NSBB as of 1/25 with hope to restart pending on blood pressure Encephalopathy -None Screening -UTD as of 9/24 Transplant -We have not broached this idea as of yet due to low MELD Vaccination -  He has been offered HBV vaccination, he continues to hold on it Other -Promote intake of 1.5 g/kg/day of Protein (Ensures/Boosts at each meal) -Complete alcohol cessation recommended again -Upper endoscopic ultrasound evaluation for duodenal nodule at some point -Repeat colonoscopy for inadequate preparation at some point he is not ready currently -Laboratories as outlined below   Orders Placed This Encounter  Procedures   US  Abdomen Limited RUQ (LIVER/GB)   CBC   Comp Met (CMET)   INR/PT   AFP tumor marker    New Prescriptions   No medications on file   Modified Medications   No medications on file    Planned Follow Up Return in about 4 months (around 05/26/2023).   Total Time in Face-to-Face and in Coordination of Care for patient including independent/personal interpretation/review of prior testing, medical history, examination, medication adjustment, communicating results with the patient directly, and documentation within the EHR is 30 minutes.   Aloha Finner, MD [facility] Office # [phone]

## 2023-01-29 LAB — AFP TUMOR MARKER: AFP-Tumor Marker: 6.4 ng/mL — ABNORMAL HIGH (ref <6.1)

## 2023-01-30 ENCOUNTER — Encounter: Payer: Self-pay | Admitting: Gastroenterology

## 2023-01-30 DIAGNOSIS — F1020 Alcohol dependence, uncomplicated: Secondary | ICD-10-CM | POA: Insufficient documentation

## 2023-01-30 DIAGNOSIS — I959 Hypotension, unspecified: Secondary | ICD-10-CM | POA: Insufficient documentation

## 2023-02-01 ENCOUNTER — Ambulatory Visit (HOSPITAL_COMMUNITY)
Admission: RE | Admit: 2023-02-01 | Discharge: 2023-02-01 | Disposition: A | Discharge status: Home / Self Care | Payer: Medicare HMO | Source: Ambulatory Visit | Attending: Gastroenterology | Admitting: Gastroenterology

## 2023-02-01 DIAGNOSIS — K8020 Calculus of gallbladder without cholecystitis without obstruction: Secondary | ICD-10-CM | POA: Diagnosis not present

## 2023-02-01 DIAGNOSIS — R932 Abnormal findings on diagnostic imaging of liver and biliary tract: Secondary | ICD-10-CM | POA: Diagnosis not present

## 2023-02-01 DIAGNOSIS — K7030 Alcoholic cirrhosis of liver without ascites: Secondary | ICD-10-CM | POA: Diagnosis not present

## 2023-02-06 ENCOUNTER — Other Ambulatory Visit: Payer: Self-pay

## 2023-02-06 DIAGNOSIS — K7030 Alcoholic cirrhosis of liver without ascites: Secondary | ICD-10-CM

## 2023-02-06 DIAGNOSIS — F1020 Alcohol dependence, uncomplicated: Secondary | ICD-10-CM

## 2023-02-19 ENCOUNTER — Other Ambulatory Visit: Payer: Self-pay | Admitting: Cardiology

## 2023-02-20 NOTE — Telephone Encounter (Signed)
This is Dr. Emelda Brothers pt. He hasn't been seen since 2021. Does Dr. Odis Hollingshead want to refill? Please advise.

## 2023-02-21 NOTE — Telephone Encounter (Signed)
Ideally defer to PCP for BP management as I have not seen her since 2021.   If she cannot get hold off the PCP please dispense 15 tabs no refills until she sees PCP.   Robert Greene [place], DO, [facility]

## 2023-02-27 ENCOUNTER — Encounter (HOSPITAL_COMMUNITY): Payer: Self-pay | Admitting: Emergency Medicine

## 2023-02-27 ENCOUNTER — Emergency Department (HOSPITAL_COMMUNITY): Payer: Medicare HMO

## 2023-02-27 ENCOUNTER — Other Ambulatory Visit: Payer: Self-pay

## 2023-02-27 ENCOUNTER — Emergency Department (HOSPITAL_COMMUNITY)
Admission: EM | Admit: 2023-02-27 | Discharge: 2023-02-27 | Disposition: A | Discharge status: Home / Self Care | Payer: Medicare HMO | Attending: Emergency Medicine | Admitting: Emergency Medicine

## 2023-02-27 DIAGNOSIS — F10929 Alcohol use, unspecified with intoxication, unspecified: Secondary | ICD-10-CM | POA: Diagnosis not present

## 2023-02-27 DIAGNOSIS — J439 Emphysema, unspecified: Secondary | ICD-10-CM | POA: Diagnosis not present

## 2023-02-27 DIAGNOSIS — R918 Other nonspecific abnormal finding of lung field: Secondary | ICD-10-CM | POA: Diagnosis not present

## 2023-02-27 DIAGNOSIS — F10120 Alcohol abuse with intoxication, uncomplicated: Secondary | ICD-10-CM | POA: Diagnosis not present

## 2023-02-27 DIAGNOSIS — F10920 Alcohol use, unspecified with intoxication, uncomplicated: Secondary | ICD-10-CM

## 2023-02-27 DIAGNOSIS — K7460 Unspecified cirrhosis of liver: Secondary | ICD-10-CM | POA: Diagnosis not present

## 2023-02-27 DIAGNOSIS — F10129 Alcohol abuse with intoxication, unspecified: Secondary | ICD-10-CM | POA: Diagnosis not present

## 2023-02-27 DIAGNOSIS — Y908 Blood alcohol level of 240 mg/100 ml or more: Secondary | ICD-10-CM | POA: Insufficient documentation

## 2023-02-27 DIAGNOSIS — R0600 Dyspnea, unspecified: Secondary | ICD-10-CM | POA: Diagnosis not present

## 2023-02-27 DIAGNOSIS — R188 Other ascites: Secondary | ICD-10-CM | POA: Insufficient documentation

## 2023-02-27 DIAGNOSIS — K8020 Calculus of gallbladder without cholecystitis without obstruction: Secondary | ICD-10-CM | POA: Diagnosis not present

## 2023-02-27 DIAGNOSIS — Z79899 Other long term (current) drug therapy: Secondary | ICD-10-CM | POA: Diagnosis not present

## 2023-02-27 DIAGNOSIS — K7031 Alcoholic cirrhosis of liver with ascites: Secondary | ICD-10-CM | POA: Diagnosis not present

## 2023-02-27 DIAGNOSIS — Z20822 Contact with and (suspected) exposure to covid-19: Secondary | ICD-10-CM | POA: Insufficient documentation

## 2023-02-27 DIAGNOSIS — J8410 Pulmonary fibrosis, unspecified: Secondary | ICD-10-CM | POA: Diagnosis not present

## 2023-02-27 DIAGNOSIS — Z743 Need for continuous supervision: Secondary | ICD-10-CM | POA: Diagnosis not present

## 2023-02-27 DIAGNOSIS — I10 Essential (primary) hypertension: Secondary | ICD-10-CM | POA: Diagnosis not present

## 2023-02-27 DIAGNOSIS — R0602 Shortness of breath: Secondary | ICD-10-CM | POA: Diagnosis not present

## 2023-02-27 LAB — RESP PANEL BY RT-PCR (RSV, FLU A&B, COVID)  RVPGX2
Influenza A by PCR: NEGATIVE
Influenza B by PCR: NEGATIVE
Resp Syncytial Virus by PCR: NEGATIVE
SARS Coronavirus 2 by RT PCR: NEGATIVE

## 2023-02-27 LAB — CBC WITH DIFFERENTIAL/PLATELET
Abs Immature Granulocytes: 0.03 10*3/uL (ref 0.00–0.07)
Basophils Absolute: 0.2 10*3/uL — ABNORMAL HIGH (ref 0.0–0.1)
Basophils Relative: 2 %
Eosinophils Absolute: 0.1 10*3/uL (ref 0.0–0.5)
Eosinophils Relative: 1 %
HCT: 41.2 % (ref 39.0–52.0)
Hemoglobin: 14.2 g/dL (ref 13.0–17.0)
Immature Granulocytes: 0 %
Lymphocytes Relative: 28 %
Lymphs Abs: 2.5 10*3/uL (ref 0.7–4.0)
MCH: 32.5 pg (ref 26.0–34.0)
MCHC: 34.5 g/dL (ref 30.0–36.0)
MCV: 94.3 fL (ref 80.0–100.0)
Monocytes Absolute: 1 10*3/uL (ref 0.1–1.0)
Monocytes Relative: 12 %
Neutro Abs: 5 10*3/uL (ref 1.7–7.7)
Neutrophils Relative %: 57 %
Platelets: 88 10*3/uL — ABNORMAL LOW (ref 150–400)
RBC: 4.37 MIL/uL (ref 4.22–5.81)
RDW: 14.7 % (ref 11.5–15.5)
WBC: 8.9 10*3/uL (ref 4.0–10.5)
nRBC: 0 % (ref 0.0–0.2)

## 2023-02-27 LAB — AMMONIA: Ammonia: 44 umol/L — ABNORMAL HIGH (ref 9–35)

## 2023-02-27 LAB — COMPREHENSIVE METABOLIC PANEL
ALT: 59 U/L — ABNORMAL HIGH (ref 0–44)
AST: 126 U/L — ABNORMAL HIGH (ref 15–41)
Albumin: 3.4 g/dL — ABNORMAL LOW (ref 3.5–5.0)
Alkaline Phosphatase: 116 U/L (ref 38–126)
Anion gap: 12 (ref 5–15)
BUN: 10 mg/dL (ref 8–23)
CO2: 21 mmol/L — ABNORMAL LOW (ref 22–32)
Calcium: 9 mg/dL (ref 8.9–10.3)
Chloride: 106 mmol/L (ref 98–111)
Creatinine, Ser: 1.08 mg/dL (ref 0.61–1.24)
GFR, Estimated: >60 mL/min (ref >60)
Glucose, Bld: 112 mg/dL — ABNORMAL HIGH (ref 70–99)
Potassium: 3.8 mmol/L (ref 3.5–5.1)
Sodium: 139 mmol/L (ref 135–145)
Total Bilirubin: 2.7 mg/dL — ABNORMAL HIGH (ref 0.0–1.2)
Total Protein: 7.2 g/dL (ref 6.5–8.1)

## 2023-02-27 LAB — ETHANOL: Alcohol, Ethyl (B): 273 mg/dL — ABNORMAL HIGH (ref <10)

## 2023-02-27 LAB — TROPONIN I (HIGH SENSITIVITY)
Troponin I (High Sensitivity): 20 ng/L — ABNORMAL HIGH (ref <18)
Troponin I (High Sensitivity): 19 ng/L — ABNORMAL HIGH (ref <18)

## 2023-02-27 LAB — PROTIME-INR
INR: 1.2 (ref 0.8–1.2)
Prothrombin Time: 15.3 s — ABNORMAL HIGH (ref 11.4–15.2)

## 2023-02-27 LAB — BRAIN NATRIURETIC PEPTIDE: B Natriuretic Peptide: 22.2 pg/mL (ref 0.0–100.0)

## 2023-02-27 LAB — LIPASE, BLOOD: Lipase: 85 U/L — ABNORMAL HIGH (ref 11–51)

## 2023-02-27 MED ORDER — LORAZEPAM 2 MG/ML IJ SOLN
1.0000 mg | Freq: Once | INTRAMUSCULAR | Status: AC
  Administered 2023-02-27: 1 mg via INTRAVENOUS
  Filled 2023-02-27: qty 1

## 2023-02-27 MED ORDER — IOHEXOL 350 MG/ML SOLN
75.0000 mL | Freq: Once | INTRAVENOUS | Status: AC | PRN
  Administered 2023-02-27: 75 mL via INTRAVENOUS

## 2023-02-27 MED ORDER — ALBUTEROL SULFATE HFA 108 (90 BASE) MCG/ACT IN AERS: 2.0000 | INHALATION_SPRAY | RESPIRATORY_TRACT | Status: DC | PRN

## 2023-02-27 NOTE — ED Notes (Signed)
 Patient transported to CT

## 2023-02-27 NOTE — ED Provider Notes (Signed)
 [facility] [facility] AT [facility] Provider Note   CSN: [id number] Arrival date & time: 02/27/23  1434     History  Chief Complaint  Patient presents with   Shortness of Breath    Robert Greene is a 63 y.o. male.  63 year old male with prior medical history detailed below presents for evaluation.  Patient reports ongoing dyspnea.  This appears to have been an issue for at least the last month.  He denies fever.  He denies chest pain or shortness of breath currently.  He denies abdominal pain.  Patient reports heavy drinking of alcohol every day.  His last drink was earlier today.  The history is provided by the patient and medical records.       Home Medications Prior to Admission medications   Medication Sig Start Date End Date Taking? Authorizing Provider  atorvastatin  (LIPITOR ) 80 MG tablet Take 80 mg by mouth daily. 10/18/16  Yes [provider]  EPINEPHrine  0.3 mg/0.3 mL IJ SOAJ injection Use once.  If ineffective, use 2nd dose. Patient taking differently: Inject 0.3 mg into the muscle as needed for anaphylaxis. 11/19/16  Yes Dean Clarity, MD  etanercept (ENBREL) 50 MG/ML injection Inject 50 mg into the skin once a week.   Yes [provider]  hydroxychloroquine  (PLAQUENIL ) 200 MG tablet Take 200 mg by mouth daily. 02/06/23  Yes [provider]  losartan  (COZAAR ) 100 MG tablet TAKE 1 TABLET BY MOUTH EVERY DAY 09/08/20  Yes Tolia, Sunit, DO  nadolol  (CORGARD ) 20 MG tablet Take 1 tablet (20 mg total) by mouth daily. 05/10/22  Yes Mansouraty, Aloha Raddle., MD  Omega-3 Fatty Acids (FISH OIL PO) Take 2 capsules by mouth 2 (two) times daily.    Yes [provider]  omeprazole  (PRILOSEC) 20 MG capsule Take 1 capsule (20 mg total) by mouth daily. 05/10/22  Yes Mansouraty, Aloha Raddle., MD  amLODipine  (NORVASC ) 5 MG tablet TAKE 1 TABLET BY MOUTH EVERY DAY Patient not taking: Reported on 02/27/2023 02/21/23   Tolia, Sunit,  DO      Allergies    Bee venom and Spider antivenin [s black widow (latrodec mactans) antivenin]    Review of Systems   Review of Systems  All other systems reviewed and are negative.   Physical Exam Updated Vital Signs BP 134/80   Pulse 88   Temp 97.9 F (36.6 C) (Oral)   Resp 16   Wt 88 kg   SpO2 100%   BMI 28.65 kg/m  Physical Exam Vitals and nursing note reviewed.  Constitutional:      General: He is not in acute distress.    Appearance: Normal appearance. He is well-developed.     Comments: Alert, oriented x 3, appears intoxicated  HENT:     Head: Normocephalic and atraumatic.  Eyes:     Conjunctiva/sclera: Conjunctivae normal.     Pupils: Pupils are equal, round, and reactive to light.  Cardiovascular:     Rate and Rhythm: Normal rate and regular rhythm.     Heart sounds: Normal heart sounds.  Pulmonary:     Effort: Pulmonary effort is normal. No respiratory distress.     Breath sounds: Normal breath sounds.  Abdominal:     General: There is no distension.     Palpations: Abdomen is soft.     Tenderness: There is no abdominal tenderness.     Comments: Soft, no rebound, no guarding, ascites present on exam  Musculoskeletal:  General: No deformity. Normal range of motion.     Cervical back: Normal range of motion and neck supple.  Skin:    General: Skin is warm and dry.  Neurological:     General: No focal deficit present.     Mental Status: He is alert and oriented to person, place, and time.     ED Results / Procedures / Treatments   Labs (all labs ordered are listed, but only abnormal results are displayed) Labs Reviewed  COMPREHENSIVE METABOLIC PANEL - Abnormal; Notable for the following components:      Result Value   CO2 21 (*)    Glucose, Bld 112 (*)    Albumin  3.4 (*)    AST 126 (*)    ALT 59 (*)    Total Bilirubin 2.7 (*)    All other components within normal limits  CBC WITH DIFFERENTIAL/PLATELET - Abnormal; Notable for the  following components:   Platelets 88 (*)    Basophils Absolute 0.2 (*)    All other components within normal limits  ETHANOL - Abnormal; Notable for the following components:   Alcohol, Ethyl (B) 273 (*)    All other components within normal limits  LIPASE, BLOOD - Abnormal; Notable for the following components:   Lipase 85 (*)    All other components within normal limits  PROTIME-INR - Abnormal; Notable for the following components:   Prothrombin Time 15.3 (*)    All other components within normal limits  AMMONIA - Abnormal; Notable for the following components:   Ammonia 44 (*)    All other components within normal limits  TROPONIN I (HIGH SENSITIVITY) - Abnormal; Notable for the following components:   Troponin I (High Sensitivity) 20 (*)    All other components within normal limits  TROPONIN I (HIGH SENSITIVITY) - Abnormal; Notable for the following components:   Troponin I (High Sensitivity) 19 (*)    All other components within normal limits  RESP PANEL BY RT-PCR (RSV, FLU A&B, COVID)  RVPGX2  BRAIN NATRIURETIC PEPTIDE    EKG EKG Interpretation Date/Time:  Tuesday February 27 2023 17:50:34 EST Ventricular Rate:  83 PR Interval:  171 QRS Duration:  81 QT Interval:  354 QTC Calculation: 416 R Axis:   29  Text Interpretation: Sinus rhythm Abnormal R-wave progression, early transition Confirmed by Laurice Coy [phone]) on 02/27/2023 5:51:37 PM  Radiology CT ABDOMEN PELVIS W CONTRAST Result Date: 02/27/2023 CLINICAL DATA:  Pulmonary embolus suspected with low to intermediate probability. Negative D-dimer. Shortness of breath. Acute nonlocalized abdominal pain. EXAM: CT ANGIOGRAPHY CHEST CT ABDOMEN AND PELVIS WITH CONTRAST TECHNIQUE: Multidetector CT imaging of the chest was performed using the standard protocol during bolus administration of intravenous contrast. Multiplanar CT image reconstructions and MIPs were obtained to evaluate the vascular anatomy. Multidetector CT  imaging of the abdomen and pelvis was performed using the standard protocol during bolus administration of intravenous contrast. RADIATION DOSE REDUCTION: This exam was performed according to the departmental dose-optimization program which includes automated exposure control, adjustment of the mA and/or kV according to patient size and/or use of iterative reconstruction technique. CONTRAST:  75mL OMNIPAQUE  IOHEXOL  350 MG/ML SOLN COMPARISON:  Chest radiograph 02/27/2023. CT chest 01/05/2022. MRI abdomen 01/05/2022 FINDINGS: CTA CHEST FINDINGS Cardiovascular: Technically adequate study with good opacification of the central and segmental pulmonary arteries. Moderate motion artifact. No focal filling defects are demonstrated. No evidence of significant pulmonary embolus. Normal heart size. No pericardial effusions. Normal caliber thoracic aorta. No aortic dissection. [employer]  vessel origins are patent. Mediastinum/Nodes: Thyroid  gland is unremarkable. Esophagus is decompressed. No significant lymphadenopathy. Lungs/Pleura: Emphysematous changes and peripheral fibrosis in the lungs. Bronchial wall thickening consistent with chronic bronchitis. No airspace disease or consolidation. No pleural effusion or pneumothorax. Musculoskeletal: Degenerative changes in the spine. No acute bony abnormalities. Review of the MIP images confirms the above findings. CT ABDOMEN and PELVIS FINDINGS Hepatobiliary: Cirrhotic changes in the liver with enlarged lateral segment left and caudate lobes. Nodular contour to the liver. No focal lesions are identified. Portal veins are patent. Cholelithiasis with small stone in the gallbladder. No gallbladder wall thickening. Bile ducts are normal. Pancreas: Unremarkable. No pancreatic ductal dilatation or surrounding inflammatory changes. Spleen: Normal in size without focal abnormality. Adrenals/Urinary Tract: Adrenal glands are unremarkable. Kidneys are normal, without renal calculi, focal lesion,  or hydronephrosis. Bladder is unremarkable. Stomach/Bowel: Stomach, small bowel, and colon are mostly decompressed. No wall thickening or inflammatory changes are suggested. Appendix is not identified. A lipoma is visualized in the duodenal. Vascular/Lymphatic: Aortic atherosclerosis. No enlarged abdominal or pelvic lymph nodes. Reproductive: Prostate is unremarkable. Other: Moderate free fluid in the abdomen and pelvis consistent with ascites. No free air. Varices are demonstrated in the paraesophageal region and upper abdomen. Musculoskeletal: Degenerative changes in the spine. No acute bony abnormalities. Review of the MIP images confirms the above findings. IMPRESSION: 1. No evidence of significant pulmonary embolus. 2. Emphysematous changes and fibrosis in the lungs. Chronic bronchitic changes. 3. Hepatic cirrhosis with moderate ascites and upper abdominal/paraesophageal varices. 4. Cholelithiasis without evidence of acute cholecystitis. 5. Aortic atherosclerosis. Electronically Signed   By: Elsie Gravely M.D.   On: 02/27/2023 20:24   CT Angio Chest PE W and/or Wo Contrast Result Date: 02/27/2023 CLINICAL DATA:  Pulmonary embolus suspected with low to intermediate probability. Negative D-dimer. Shortness of breath. Acute nonlocalized abdominal pain. EXAM: CT ANGIOGRAPHY CHEST CT ABDOMEN AND PELVIS WITH CONTRAST TECHNIQUE: Multidetector CT imaging of the chest was performed using the standard protocol during bolus administration of intravenous contrast. Multiplanar CT image reconstructions and MIPs were obtained to evaluate the vascular anatomy. Multidetector CT imaging of the abdomen and pelvis was performed using the standard protocol during bolus administration of intravenous contrast. RADIATION DOSE REDUCTION: This exam was performed according to the departmental dose-optimization program which includes automated exposure control, adjustment of the mA and/or kV according to patient size and/or use of  iterative reconstruction technique. CONTRAST:  75mL OMNIPAQUE  IOHEXOL  350 MG/ML SOLN COMPARISON:  Chest radiograph 02/27/2023. CT chest 01/05/2022. MRI abdomen 01/05/2022 FINDINGS: CTA CHEST FINDINGS Cardiovascular: Technically adequate study with good opacification of the central and segmental pulmonary arteries. Moderate motion artifact. No focal filling defects are demonstrated. No evidence of significant pulmonary embolus. Normal heart size. No pericardial effusions. Normal caliber thoracic aorta. No aortic dissection. Great vessel origins are patent. Mediastinum/Nodes: Thyroid  gland is unremarkable. Esophagus is decompressed. No significant lymphadenopathy. Lungs/Pleura: Emphysematous changes and peripheral fibrosis in the lungs. Bronchial wall thickening consistent with chronic bronchitis. No airspace disease or consolidation. No pleural effusion or pneumothorax. Musculoskeletal: Degenerative changes in the spine. No acute bony abnormalities. Review of the MIP images confirms the above findings. CT ABDOMEN and PELVIS FINDINGS Hepatobiliary: Cirrhotic changes in the liver with enlarged lateral segment left and caudate lobes. Nodular contour to the liver. No focal lesions are identified. Portal veins are patent. Cholelithiasis with small stone in the gallbladder. No gallbladder wall thickening. Bile ducts are normal. Pancreas: Unremarkable. No pancreatic ductal dilatation or surrounding inflammatory changes. Spleen: Normal  in size without focal abnormality. Adrenals/Urinary Tract: Adrenal glands are unremarkable. Kidneys are normal, without renal calculi, focal lesion, or hydronephrosis. Bladder is unremarkable. Stomach/Bowel: Stomach, small bowel, and colon are mostly decompressed. No wall thickening or inflammatory changes are suggested. Appendix is not identified. A lipoma is visualized in the duodenal. Vascular/Lymphatic: Aortic atherosclerosis. No enlarged abdominal or pelvic lymph nodes. Reproductive:  Prostate is unremarkable. Other: Moderate free fluid in the abdomen and pelvis consistent with ascites. No free air. Varices are demonstrated in the paraesophageal region and upper abdomen. Musculoskeletal: Degenerative changes in the spine. No acute bony abnormalities. Review of the MIP images confirms the above findings. IMPRESSION: 1. No evidence of significant pulmonary embolus. 2. Emphysematous changes and fibrosis in the lungs. Chronic bronchitic changes. 3. Hepatic cirrhosis with moderate ascites and upper abdominal/paraesophageal varices. 4. Cholelithiasis without evidence of acute cholecystitis. 5. Aortic atherosclerosis. Electronically Signed   By: Elsie Gravely M.D.   On: 02/27/2023 20:24   DG Chest 2 View Result Date: 02/27/2023 CLINICAL DATA:  Shortness of breath EXAM: CHEST - 2 VIEW COMPARISON:  Chest radiograph dated 12/25/2017 CT chest dated 01/05/2022 FINDINGS: Normal lung volumes. Increased bibasilar patchy opacities. Diffuse subpleural reticulations, right-greater-than-left. No pleural effusion or pneumothorax. The heart size and mediastinal contours are within normal limits. No acute osseous abnormality. IMPRESSION: Increased bibasilar patchy opacities, which may represent infection/inflammation superimposed on known interstitial lung disease. Electronically Signed   By: Limin  Xu M.D.   On: 02/27/2023 16:36    Procedures Procedures    Medications Ordered in ED Medications  albuterol  (VENTOLIN  HFA) 108 (90 Base) MCG/ACT inhaler 2 puff (has no administration in time range)  LORazepam  (ATIVAN ) injection 1 mg (has no administration in time range)  LORazepam  (ATIVAN ) injection 1 mg (1 mg Intravenous Given 02/27/23 1804)  iohexol  (OMNIPAQUE ) 350 MG/ML injection 75 mL (75 mLs Intravenous Contrast Given 02/27/23 2014)    ED Course/ Medical Decision Making/ A&P                                 Medical Decision Making Amount and/or Complexity of Data Reviewed Labs:  ordered. Radiology: ordered.  Risk Prescription drug management.    Medical Screen Complete  This patient presented to the ED with complaint of dyspnea.  This complaint involves an extensive number of treatment options. The initial differential diagnosis includes, but is not limited to, alcohol intoxication, metabolic abnormality, coagulopathy, etc.  This presentation is: Acute, Chronic, Self-Limited, Previously Undiagnosed, Uncertain Prognosis, Complicated, Systemic Symptoms, and Threat to Life/Bodily Function  Patient with history of chronic alcoholic abuse, cirrhosis, presents with complaint of shortness of breath.  Complaints are chronic in nature.  Screening labs obtained are without significant acute abnormality.  Patient is intoxicated with an alcohol up to 73.  Imaging of the chest abdomen pelvis are without acute abnormality.  On reevaluation the patient feels improved.  He desires discharge home.  He does have established outpatient care including gastroenterology.  Moderation of alcohol consumption is advised.  Close follow-up with GI is stressed.  Strict return precautions given and understood.    Additional history obtained: External records from outside sources obtained and reviewed including prior ED visits and prior Inpatient records.    Lab Tests:  I ordered and personally interpreted labs.  The pertinent results include: CBC, CMP, lipase, EtOH, ammonia, INR   Imaging Studies ordered:  I ordered imaging studies including CT chest, CT abdomen pelvis,  plain films of chest I independently visualized and interpreted obtained imaging which showed NAD, ascites present, no PE I agree with the radiologist interpretation.   Cardiac Monitoring:  The patient was maintained on a cardiac monitor.  I personally viewed and interpreted the cardiac monitor which showed an underlying rhythm of: NSR   Problem List / ED Course:  Alcohol intoxication, chronic alcohol  use, cirrhosis   Reevaluation:  After the interventions noted above, I reevaluated the patient and found that they have: improved  Disposition:  After consideration of the diagnostic results and the patients response to treatment, I feel that the patent would benefit from close outpatient follow-up.          Final Clinical Impression(s) / ED Diagnoses Final diagnoses:  Dyspnea, unspecified type  Cirrhosis of liver with ascites, unspecified hepatic cirrhosis type (HCC)  Alcoholic intoxication without complication [facility])    Rx / DC Orders ED Discharge Orders     None         Laurice Maude BROCKS, MD 02/27/23 2327

## 2023-02-27 NOTE — Discharge Instructions (Signed)
 Return for any problem.  ?

## 2023-02-27 NOTE — ED Triage Notes (Addendum)
Pt presents from home via EMS for SOB for 1 mo.  H/o pulmonary fibrosis  RR30, lungs clear, 98% RA, worse with exertion, EKG unremarkable, 136/93, 143CBG, 33F   Pt drinks 2-3 shots today, has had 2 shots today, last drink at 10am

## 2023-02-28 ENCOUNTER — Other Ambulatory Visit: Payer: Self-pay

## 2023-02-28 ENCOUNTER — Telehealth: Payer: Self-pay

## 2023-02-28 DIAGNOSIS — K7030 Alcoholic cirrhosis of liver without ascites: Secondary | ICD-10-CM

## 2023-02-28 DIAGNOSIS — R188 Other ascites: Secondary | ICD-10-CM

## 2023-02-28 NOTE — Telephone Encounter (Signed)
 I spoke with the pt and he tells me that he feels better today. He has no distention in his abdomen.  He does not feel like he needs to have a paracentesis at this time. He will call back if things change.

## 2023-02-28 NOTE — Telephone Encounter (Signed)
 The pt daughter returned call and states her dad is not telling everything about his health to us .  She says his stomach is still distended and she would like to proceed with paracentesis. He has agreed.   Dr Wilhelmenia would you like albumin  if so, how much?  Is there a limit to be removed?

## 2023-02-28 NOTE — Addendum Note (Signed)
 Addended by: Aneita Keens on: 02/28/2023 01:06 PM   Modules accepted: Orders

## 2023-02-28 NOTE — Telephone Encounter (Signed)
 Inbound call from patient's daughter stating patient is still having complications. States patient had blood in his cough. Requesting a call back to discuss further. Please advise, thank you.

## 2023-02-28 NOTE — Telephone Encounter (Signed)
 Appt scheduled for 2/7 at 130 pm for paracentesis pt aware

## 2023-02-28 NOTE — Telephone Encounter (Signed)
 Verbal order from Dr Brice Campi 4 liters max and 25 g albumin.    Orders placed and sent to the schedulers

## 2023-02-28 NOTE — Telephone Encounter (Signed)
 Mansouraty, Aloha Raddle., MD  Laurice Maude BROCKS, MD; Anitra Odetta CROME, RN [facility], Thanks for forwarding this.  Iman Reinertsen, Please reach out to patient and make a telephone note. Ask him if he feels his abdomen is distended enough that he would want a paracentesis. If he is in agreement, then we will need to send the following labs: Fluid Albumin /Total Protein/LDH/Culture/Cell-Count. Thanks. GM

## 2023-03-02 ENCOUNTER — Ambulatory Visit (HOSPITAL_COMMUNITY)
Admission: RE | Admit: 2023-03-02 | Discharge: 2023-03-02 | Disposition: A | Discharge status: Home / Self Care | Payer: Medicare HMO | Source: Ambulatory Visit | Attending: Gastroenterology | Admitting: Gastroenterology

## 2023-03-02 ENCOUNTER — Other Ambulatory Visit: Payer: Self-pay | Admitting: Gastroenterology

## 2023-03-02 DIAGNOSIS — R188 Other ascites: Secondary | ICD-10-CM

## 2023-03-02 DIAGNOSIS — K7030 Alcoholic cirrhosis of liver without ascites: Secondary | ICD-10-CM | POA: Insufficient documentation

## 2023-03-12 DIAGNOSIS — K7460 Unspecified cirrhosis of liver: Secondary | ICD-10-CM | POA: Diagnosis not present

## 2023-03-16 ENCOUNTER — Encounter: Payer: Self-pay | Admitting: Gastroenterology

## 2023-03-16 DIAGNOSIS — Z008 Encounter for other general examination: Secondary | ICD-10-CM | POA: Diagnosis not present

## 2023-03-16 DIAGNOSIS — Z833 Family history of diabetes mellitus: Secondary | ICD-10-CM | POA: Diagnosis not present

## 2023-03-16 DIAGNOSIS — K7030 Alcoholic cirrhosis of liver without ascites: Secondary | ICD-10-CM | POA: Diagnosis not present

## 2023-03-16 DIAGNOSIS — N529 Male erectile dysfunction, unspecified: Secondary | ICD-10-CM | POA: Diagnosis not present

## 2023-03-16 DIAGNOSIS — I8500 Esophageal varices without bleeding: Secondary | ICD-10-CM | POA: Diagnosis not present

## 2023-03-16 DIAGNOSIS — F1020 Alcohol dependence, uncomplicated: Secondary | ICD-10-CM | POA: Diagnosis not present

## 2023-03-16 DIAGNOSIS — M069 Rheumatoid arthritis, unspecified: Secondary | ICD-10-CM | POA: Diagnosis not present

## 2023-03-16 DIAGNOSIS — E785 Hyperlipidemia, unspecified: Secondary | ICD-10-CM | POA: Diagnosis not present

## 2023-03-16 DIAGNOSIS — I700 Atherosclerosis of aorta: Secondary | ICD-10-CM | POA: Diagnosis not present

## 2023-03-16 DIAGNOSIS — J439 Emphysema, unspecified: Secondary | ICD-10-CM | POA: Diagnosis not present

## 2023-03-16 DIAGNOSIS — J8410 Pulmonary fibrosis, unspecified: Secondary | ICD-10-CM | POA: Diagnosis not present

## 2023-03-16 DIAGNOSIS — R32 Unspecified urinary incontinence: Secondary | ICD-10-CM | POA: Diagnosis not present

## 2023-03-16 DIAGNOSIS — K219 Gastro-esophageal reflux disease without esophagitis: Secondary | ICD-10-CM | POA: Diagnosis not present

## 2023-03-16 NOTE — Progress Notes (Signed)
Review of outside records to be scanned into the chart  Laboratories from 2/17 outside  Sodium 139 Potassium 4.1 BUN/creatinine 10/0.78 AST/ALT 71/44 Alk phos 110 Total bili 2.0 Albumin 3.6 Total protein 6.7 WBC 4.9 Hemoglobin/hematocrit 13/38.8 MCV 96.8 Platelets 104

## 2023-04-02 ENCOUNTER — Telehealth: Payer: Self-pay

## 2023-04-02 NOTE — Telephone Encounter (Signed)
-----   Message from Nurse Adana Marik P sent at 10/02/2022  4:22 PM EDT ----- 76-month HCC screening with liver ultrasound recall.

## 2023-04-02 NOTE — Telephone Encounter (Signed)
 Pt due in July will call pt at that time.  Last [place] 01/2023

## 2023-04-11 ENCOUNTER — Ambulatory Visit: Payer: Medicare HMO | Admitting: Gastroenterology

## 2023-04-11 ENCOUNTER — Other Ambulatory Visit

## 2023-04-11 ENCOUNTER — Encounter: Payer: Self-pay | Admitting: Gastroenterology

## 2023-04-11 VITALS — BP 102/60 | HR 61 | Ht 69.0 in | Wt 201.0 lb

## 2023-04-11 DIAGNOSIS — F1020 Alcohol dependence, uncomplicated: Secondary | ICD-10-CM

## 2023-04-11 DIAGNOSIS — I8510 Secondary esophageal varices without bleeding: Secondary | ICD-10-CM

## 2023-04-11 DIAGNOSIS — K7030 Alcoholic cirrhosis of liver without ascites: Secondary | ICD-10-CM

## 2023-04-11 DIAGNOSIS — F10230 Alcohol dependence with withdrawal, uncomplicated: Secondary | ICD-10-CM | POA: Diagnosis not present

## 2023-04-11 LAB — COMPREHENSIVE METABOLIC PANEL
ALT: 39 U/L (ref 0–53)
AST: 47 U/L — ABNORMAL HIGH (ref 0–37)
Albumin: 3.6 g/dL (ref 3.5–5.2)
Alkaline Phosphatase: 102 U/L (ref 39–117)
BUN: 15 mg/dL (ref 6–23)
CO2: 25 meq/L (ref 19–32)
Calcium: 9.3 mg/dL (ref 8.4–10.5)
Chloride: 106 meq/L (ref 96–112)
Creatinine, Ser: 0.71 mg/dL (ref 0.40–1.50)
GFR: 98.03 mL/min (ref >60.00)
Glucose, Bld: 128 mg/dL — ABNORMAL HIGH (ref 70–99)
Potassium: 3.9 meq/L (ref 3.5–5.1)
Sodium: 136 meq/L (ref 135–145)
Total Bilirubin: 1.3 mg/dL — ABNORMAL HIGH (ref 0.2–1.2)
Total Protein: 6.9 g/dL (ref 6.0–8.3)

## 2023-04-11 LAB — CBC WITH DIFFERENTIAL/PLATELET
Basophils Absolute: 0.1 10*3/uL (ref 0.0–0.1)
Basophils Relative: 1.2 % (ref 0.0–3.0)
Eosinophils Absolute: 0.2 10*3/uL (ref 0.0–0.7)
Eosinophils Relative: 3.2 % (ref 0.0–5.0)
HCT: 39.3 % (ref 39.0–52.0)
Hemoglobin: 13.2 g/dL (ref 13.0–17.0)
Lymphocytes Relative: 28.4 % (ref 12.0–46.0)
Lymphs Abs: 2 10*3/uL (ref 0.7–4.0)
MCHC: 33.6 g/dL (ref 30.0–36.0)
MCV: 94.4 fl (ref 78.0–100.0)
Monocytes Absolute: 1 10*3/uL (ref 0.1–1.0)
Monocytes Relative: 14.8 % — ABNORMAL HIGH (ref 3.0–12.0)
Neutro Abs: 3.7 10*3/uL (ref 1.4–7.7)
Neutrophils Relative %: 52.4 % (ref 43.0–77.0)
Platelets: 117 10*3/uL — ABNORMAL LOW (ref 150.0–400.0)
RBC: 4.17 Mil/uL — ABNORMAL LOW (ref 4.22–5.81)
RDW: 14.8 % (ref 11.5–15.5)
WBC: 7.1 10*3/uL (ref 4.0–10.5)

## 2023-04-11 LAB — PROTIME-INR
INR: 1.3 ratio — ABNORMAL HIGH (ref 0.8–1.0)
Prothrombin Time: 13.8 s — ABNORMAL HIGH (ref 9.6–13.1)

## 2023-04-11 NOTE — Patient Instructions (Addendum)
 We will contact you to schedule an upper endoscopy at [facility].  Your provider has requested that you go to the basement level for lab work before leaving today. Press "B" on the elevator. The lab is located at the first door on the left as you exit the elevator.  Due to recent changes in healthcare laws, you may see the results of your imaging and laboratory studies on MyChart before your provider has had a chance to review them.  We understand that in some cases there may be results that are confusing or concerning to you. Not all laboratory results come back in the same time frame and the provider may be waiting for multiple results in order to interpret others.  Please give [place] 48 hours in order for your provider to thoroughly review all the results before contacting the office for clarification of your results.   Thank you for trusting me with your gastrointestinal care!   Boone Master, PA

## 2023-04-11 NOTE — Progress Notes (Signed)
 Recall has been entered for EUS as ordered

## 2023-04-11 NOTE — Progress Notes (Signed)
 Chief Complaint: Cirrhosis follow up Primary GI MD: Dr. Meridee Score  HPI: 63 y.o. male with a pmh significant for CAD, hypertension, hyperlipidemia, nephrolithiasis, rheumatoid arthritis, continued alcohol use disorder, alcoholic cirrhosis (complicated by portal hypertension manifested as EVs, PHG, Thrombocytopenia), colon polyps (TAs), duodenal SEL presents for follow up  Last seen 01/26/2023 by Dr. Meridee Score (please see his note for details) At last visit he had to stop nadolol due to hypotension.  He was recommended to follow-up with PCP and cardiologist and if unable to improve may have to consider EGD for banding. Last MELD 3.0: 13 02/27/2023  Seen in ED 02/27/2023 for dyspnea.  Had a CT abdomen pelvis with contrast which showed cirrhosis with moderate ascites and upper abdominal/paraesophageal varices.  No liver lesions.  Cholelithiasis..  Showed chronic bronchitic changes  Dr. Meridee Score ordered paracentesis 03/02/2023.  Very small amount of ascites was observed and was not amenable to percutaneous procedure.  Discussed the use of AI scribe software for clinical note transcription with the patient, who gave verbal consent to proceed.  He has a history of cirrhosis and was recently evaluated in the emergency department for breathing difficulties, which occurred once in February. No further episodes have occurred since then. During the emergency visit, a CT scan revealed esophageal varices, but no lesions were found. He is currently taking nadolol daily and today his BP is 102/60  He has a history of alcohol use and has abstained from alcohol for the past three weeks. Previous attempts to quit drinking resulted in withdrawal symptoms such as sickness lasting from one to four days. He previously consumed up to a fifth of alcohol daily. He attributes his decision to quit to complaints from his children and acknowledges the impact of alcohol on his liver health.  No current symptoms such as  abdominal pain, nausea, vomiting, or edema in the abdomen or legs. He reports regular bowel movements, typically one to two per day, and denies any tremors or seizures. He lives alone and engages in activities like cleaning and yard work to occupy his time.      PREVIOUS GI WORKUP   EGD 07/2021 - Nodular mucosa - ? GEM found in the proximal esophagus. Biopsied. - No gross lesions in esophagus in middle esophagus.- Grade II esophageal varices distally. - Z- line irregular, 41 cm from the incisors. - 1 cm hiatal hernia. - Portal hypertensive gastropathy. - Gastritis. Biopsied. - Nodular mucosa in the duodenal bulb and in the second portion of the duodenum. Biopsied. - Submucosal lesion found in D2 ( not clearly a lipoma but not clearly a varix) .  Colonoscopy 07/2021 - Preparation of the colon was inadequate and after extensive lavage we still had poor visualization. - Hemorrhoids found on digital rectal exam. - One 6 mm polyp in the ascending colon, removed with a cold snare. Resected and retrieved. - Non- bleeding non- thrombosed external and internal hemorrhoids.  Diagnosis 1. Surgical [P], gastric REACTIVE GASTROPATHY NEGATIVE FOR H. PYLORI, INTESTINAL METAPLASIA, DYSPLASIA OR CARCINOMA 2. Surgical [P], duodenal REACTIVE DUODENAL MUCOSA WITH GASTRIC METAPLASIA COMPATIBLE WITH PEPTIC DUODENITIS 3. Surgical [P], esophageal lesion REACTIVE SQUAMOUS MUCOSA MINIMAL CHRONIC GASTRITIS NEGATIVE FOR INTESTINAL METAPLASIA, DYSPLASIA AND CARCINOMA (SEE MICROSCOPIC COMMENT) 4. Surgical [P], colon, ascending, polyp (1) TUBULAR ADENOMA NEGATIVE FOR HIGH-GRADE DYSPLASIA AND CARCINOMA  Past Medical History:  Diagnosis Date   Alcoholism (HCC)    Anxiety    Arthritis    rheumatoid   Cirrhosis (HCC)    Coronary artery calcification seen  on CAT scan 04/08/2018   Dyspnea    HTN (hypertension) 04/08/2018   Hypercholesteremia    Hypertension    Interstitial lung disease (HCC)    Kidney stones 2020    Pulmonary fibrosis (HCC)     Past Surgical History:  Procedure Laterality Date   KNEE CARTILAGE SURGERY Left    LUNG BIOPSY Right 12/06/2017   Procedure: LUNG BIOPSY;  Surgeon: Loreli Slot, MD;  Location: [facility] OR;  Service: Thoracic;  Laterality: Right;   VIDEO ASSISTED THORACOSCOPY Right 12/06/2017   Procedure: VIDEO ASSISTED THORACOSCOPY;  Surgeon: Loreli Slot, MD;  Location: [facility] OR;  Service: Thoracic;  Laterality: Right;    Current Outpatient Medications  Medication Sig Dispense Refill   amLODipine (NORVASC) 5 MG tablet TAKE 1 TABLET BY MOUTH EVERY DAY 15 tablet 0   atorvastatin (LIPITOR) 80 MG tablet Take 80 mg by mouth daily.  1   EPINEPHrine 0.3 mg/0.3 mL IJ SOAJ injection Use once.  If ineffective, use 2nd dose. (Patient taking differently: Inject 0.3 mg into the muscle as needed for anaphylaxis.) 2 Device 0   etanercept (ENBREL) 50 MG/ML injection Inject 50 mg into the skin once a week.     hydroxychloroquine (PLAQUENIL) 200 MG tablet Take 200 mg by mouth daily.     losartan (COZAAR) 100 MG tablet TAKE 1 TABLET BY MOUTH EVERY DAY 90 tablet 1   nadolol (CORGARD) 20 MG tablet Take 1 tablet (20 mg total) by mouth daily. 90 tablet 2   Omega-3 Fatty Acids (FISH OIL PO) Take 2 capsules by mouth 2 (two) times daily.      omeprazole (PRILOSEC) 20 MG capsule Take 1 capsule (20 mg total) by mouth daily. 30 capsule 11   No current facility-administered medications for this visit.    Allergies as of 04/11/2023 - Review Complete 04/11/2023  Allergen Reaction Noted   Bee venom Anaphylaxis, Hives, and Rash 07/25/2013   Spider antivenin [s black widow (latrodec mactans) antivenin] Anaphylaxis, Hives, and Rash 05/12/2013    Family History  Problem Relation Age of Onset   Cirrhosis Father    Colon cancer Neg Hx    Stomach cancer Neg Hx    Esophageal cancer Neg Hx    Inflammatory bowel disease Neg Hx    Liver disease Neg Hx    Pancreatic cancer Neg Hx    Rectal cancer  Neg Hx     Social History   Socioeconomic History   Marital status: Widowed    Spouse name: Not on file   Number of children: 2   Years of education: Not on file   Highest education level: Not on file  Occupational History   Not on file  Tobacco Use   Smoking status: Every Day    Current packs/day: 1.50    Average packs/day: 1.5 packs/day for 40.0 years (60.0 ttl pk-yrs)    Types: Cigarettes   Smokeless tobacco: Never   Tobacco comments:    Pt states he does smoke every now and then but not much. 02/14/22 ALS   Vaping Use   Vaping status: Every Day  Substance and Sexual Activity   Alcohol use: Yes    Alcohol/week: 1.0 standard drink of alcohol    Types: 1 Standard drinks or equivalent per week    Comment: 3 weeks without alcohol   Drug use: No   Sexual activity: Yes    Birth control/protection: None  Other Topics Concern   Not on file  Social History Narrative  Not on file   Social Drivers of Health   Financial Resource Strain: Not on file  Food Insecurity: Not on file  Transportation Needs: Not on file  Physical Activity: Not on file  Stress: Not on file  Social Connections: Not on file  Intimate Partner Violence: Not on file    Review of Systems:    Constitutional: No weight loss, fever, chills, weakness or fatigue HEENT: Eyes: No change in vision               Ears, Nose, Throat:  No change in hearing or congestion Skin: No rash or itching Cardiovascular: No chest pain, chest pressure or palpitations   Respiratory: No SOB or cough Gastrointestinal: See HPI and otherwise negative Genitourinary: No dysuria or change in urinary frequency Neurological: No headache, dizziness or syncope Musculoskeletal: No new muscle or joint pain Hematologic: No bleeding or bruising Psychiatric: No history of depression or anxiety    Physical Exam:  Vital signs: BP 102/60   Pulse 61   Ht 5\' 9"  (1.753 m)   Wt 201 lb (91.2 kg)   BMI 29.68 kg/m   Constitutional: NAD,  Well developed, Well nourished, alert and cooperative Head:  Normocephalic and atraumatic. Eyes:   PEERL, EOMI. No icterus. Conjunctiva pink. Respiratory: Respirations even and unlabored. Lungs clear to auscultation bilaterally.   No wheezes, crackles, or rhonchi.  Cardiovascular:  Regular rate and rhythm. No peripheral edema, cyanosis or pallor.  Gastrointestinal:  Soft, nondistended, nontender. No rebound or guarding. Normal bowel sounds. No appreciable masses or hepatomegaly. Rectal:  Not performed.  Msk:  Symmetrical without gross deformities. Without edema, no deformity or joint abnormality.  Neurologic:  Alert and  oriented x4;  grossly normal neurologically.  Skin:   Dry and intact without significant lesions or rashes. Psychiatric: Oriented to person, place and time. Demonstrates good judgement and reason without abnormal affect or behaviors.  Physical Exam   CHEST: Lungs clear to auscultation bilaterally. CARDIOVASCULAR: Heart sounds normal. ABDOMEN: Normal bowel sounds. NEUROLOGICAL: No asterixis.       RELEVANT LABS AND IMAGING: CBC    Component Value Date/Time   WBC 8.9 02/27/2023 1721   RBC 4.37 02/27/2023 1721   HGB 14.2 02/27/2023 1721   HCT 41.2 02/27/2023 1721   PLT 88 (L) 02/27/2023 1721   MCV 94.3 02/27/2023 1721   MCH 32.5 02/27/2023 1721   MCHC 34.5 02/27/2023 1721   RDW 14.7 02/27/2023 1721   LYMPHSABS 2.5 02/27/2023 1721   MONOABS 1.0 02/27/2023 1721   EOSABS 0.1 02/27/2023 1721   BASOSABS 0.2 (H) 02/27/2023 1721    CMP     Component Value Date/Time   NA 139 02/27/2023 1721   NA 142 04/09/2018 1038   K 3.8 02/27/2023 1721   CL 106 02/27/2023 1721   CO2 21 (L) 02/27/2023 1721   GLUCOSE 112 (H) 02/27/2023 1721   BUN 10 02/27/2023 1721   BUN 9 04/09/2018 1038   CREATININE 1.08 02/27/2023 1721   CALCIUM 9.0 02/27/2023 1721   PROT 7.2 02/27/2023 1721   PROT 7.1 04/09/2018 1038   ALBUMIN 3.4 (L) 02/27/2023 1721   ALBUMIN 4.6 04/09/2018 1038    AST 126 (H) 02/27/2023 1721   ALT 59 (H) 02/27/2023 1721   ALKPHOS 116 02/27/2023 1721   BILITOT 2.7 (H) 02/27/2023 1721   BILITOT 0.3 04/09/2018 1038   GFRNONAA >60 02/27/2023 1721   GFRAA 118 04/09/2018 1038     Assessment/Plan:      Esophageal  varices 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 Risk of bleeding present. Nadolol prescribed for prophylaxis though was recommended to be d/c at last appt due to BP of 84/52. He did not follow up with PCP or cardiology but his BP has improved despite continued nadolol. - Schedule endoscopy for variceal banding to be done at the hospital - I thoroughly discussed the procedure with the patient (at bedside) to include nature of the procedure, alternatives, benefits, and risks (including but not limited to bleeding, infection, perforation, anesthesia/cardiac pulmonary complications).  Patient verbalized understanding and gave verbal consent to proceed with procedure.  - D/C nadolol due to continued hypotension (though improved from last visit) - follow up based on procedure  Alcoholic cirrhosis without ascites History of alcoholic cirrhosis with recent CTA showing varices but no focal lesions.  Declines hepatitis B vaccine.  Has abstained from alcohol for the last 3 weeks with minimal withdrawal symptoms. Last MELD 2/4 3:0: 13. Recent attempt at paracentesis aborted due to minimal ascites not amenable for procedure. No asterixis on exam. UTD on HCC screening (next due 02/2023) - Encourage continued abstinence from alcohol. - Obtain lab work to monitor liver function and calculate MELD (CBC, CMP, PT/INR)  Alcohol withdrawal History of alcohol withdrawal symptoms with recent improvement. Abstinent for three weeks. - Encourage continued abstinence from alcohol. - Provide support and resources for managing withdrawal symptoms.  Hypotension Low blood pressure noted. - Monitor blood pressure. - d/c nadolol for now - continue follow  up with PCP and cardiology      This visit required 38 minutes of patient care (this includes precharting, chart review, review of results, face-to-face time used for counseling as well as treatment plan and follow-up. The patient was provided an opportunity to ask questions and all were answered. The patient agreed with the plan and demonstrated an understanding of the instructions.   Lara Mulch [facility] 04/11/2023, 9:10 AM  Cc: Irven Coe, MD

## 2023-04-11 NOTE — Progress Notes (Signed)
 Attending Physician's Attestation   I have reviewed the chart.   I agree with the Advanced Practitioner's note, impression, and recommendations with any updates as below. Agree that in the setting of persistent hypotension, continued beta-blockade will likely not be possible.  In this particular case the consideration of endoscopic banding will need to be had.  Would try to allow patient to follow-up with primary care provider first and cardiology for so that they can work on blood pressure before we move forward with an endoscopic evaluation.  Thus hospital-based endoscopy in 2 to 4 months makes sense for variceal surveillance/screening and possible banding.  Please make this recall in the system.   Corliss Parish, MD [facility] Advanced Endoscopy Office # [phone]

## 2023-05-28 DIAGNOSIS — M0609 Rheumatoid arthritis without rheumatoid factor, multiple sites: Secondary | ICD-10-CM | POA: Diagnosis not present

## 2023-05-29 LAB — LAB REPORT - SCANNED: EGFR (Non-African Amer.): 101

## 2023-07-11 ENCOUNTER — Other Ambulatory Visit: Payer: Self-pay | Admitting: Gastroenterology

## 2023-07-11 NOTE — Telephone Encounter (Signed)
 Patient needs to contact office. Nadolol  per last note was to be d/c per [facility].

## 2023-07-25 DIAGNOSIS — E7800 Pure hypercholesterolemia, unspecified: Secondary | ICD-10-CM | POA: Diagnosis not present

## 2023-07-25 DIAGNOSIS — I10 Essential (primary) hypertension: Secondary | ICD-10-CM | POA: Diagnosis not present

## 2023-07-25 DIAGNOSIS — M069 Rheumatoid arthritis, unspecified: Secondary | ICD-10-CM | POA: Diagnosis not present

## 2023-07-25 DIAGNOSIS — F329 Major depressive disorder, single episode, unspecified: Secondary | ICD-10-CM | POA: Diagnosis not present

## 2023-07-25 DIAGNOSIS — K7460 Unspecified cirrhosis of liver: Secondary | ICD-10-CM | POA: Diagnosis not present

## 2023-07-25 DIAGNOSIS — R7301 Impaired fasting glucose: Secondary | ICD-10-CM | POA: Diagnosis not present

## 2023-07-25 DIAGNOSIS — J849 Interstitial pulmonary disease, unspecified: Secondary | ICD-10-CM | POA: Diagnosis not present

## 2023-07-25 DIAGNOSIS — Z125 Encounter for screening for malignant neoplasm of prostate: Secondary | ICD-10-CM | POA: Diagnosis not present

## 2023-07-25 DIAGNOSIS — Z0000 Encounter for general adult medical examination without abnormal findings: Secondary | ICD-10-CM | POA: Diagnosis not present

## 2023-08-01 ENCOUNTER — Inpatient Hospital Stay (HOSPITAL_COMMUNITY)
Admission: EM | Admit: 2023-08-01 | Discharge: 2023-08-06 | DRG: 432 | Disposition: A | Discharge status: Home Health | Attending: Internal Medicine | Admitting: Internal Medicine

## 2023-08-01 ENCOUNTER — Encounter (HOSPITAL_COMMUNITY): Payer: Self-pay

## 2023-08-01 ENCOUNTER — Emergency Department (HOSPITAL_COMMUNITY)

## 2023-08-01 DIAGNOSIS — I8511 Secondary esophageal varices with bleeding: Secondary | ICD-10-CM | POA: Diagnosis not present

## 2023-08-01 DIAGNOSIS — K3189 Other diseases of stomach and duodenum: Secondary | ICD-10-CM | POA: Diagnosis not present

## 2023-08-01 DIAGNOSIS — R112 Nausea with vomiting, unspecified: Secondary | ICD-10-CM | POA: Diagnosis present

## 2023-08-01 DIAGNOSIS — D125 Benign neoplasm of sigmoid colon: Secondary | ICD-10-CM | POA: Diagnosis present

## 2023-08-01 DIAGNOSIS — K625 Hemorrhage of anus and rectum: Principal | ICD-10-CM

## 2023-08-01 DIAGNOSIS — K449 Diaphragmatic hernia without obstruction or gangrene: Secondary | ICD-10-CM | POA: Diagnosis present

## 2023-08-01 DIAGNOSIS — K7460 Unspecified cirrhosis of liver: Secondary | ICD-10-CM | POA: Diagnosis not present

## 2023-08-01 DIAGNOSIS — J8410 Pulmonary fibrosis, unspecified: Secondary | ICD-10-CM | POA: Diagnosis not present

## 2023-08-01 DIAGNOSIS — Z538 Procedure and treatment not carried out for other reasons: Secondary | ICD-10-CM | POA: Diagnosis not present

## 2023-08-01 DIAGNOSIS — K219 Gastro-esophageal reflux disease without esophagitis: Secondary | ICD-10-CM | POA: Diagnosis present

## 2023-08-01 DIAGNOSIS — F419 Anxiety disorder, unspecified: Secondary | ICD-10-CM | POA: Diagnosis present

## 2023-08-01 DIAGNOSIS — Z889 Allergy status to unspecified drugs, medicaments and biological substances status: Secondary | ICD-10-CM

## 2023-08-01 DIAGNOSIS — F17290 Nicotine dependence, other tobacco product, uncomplicated: Secondary | ICD-10-CM | POA: Diagnosis present

## 2023-08-01 DIAGNOSIS — K921 Melena: Secondary | ICD-10-CM | POA: Diagnosis not present

## 2023-08-01 DIAGNOSIS — Z716 Tobacco abuse counseling: Secondary | ICD-10-CM

## 2023-08-01 DIAGNOSIS — I10 Essential (primary) hypertension: Secondary | ICD-10-CM | POA: Diagnosis present

## 2023-08-01 DIAGNOSIS — M159 Polyosteoarthritis, unspecified: Secondary | ICD-10-CM | POA: Diagnosis present

## 2023-08-01 DIAGNOSIS — I868 Varicose veins of other specified sites: Secondary | ICD-10-CM | POA: Diagnosis present

## 2023-08-01 DIAGNOSIS — D6959 Other secondary thrombocytopenia: Secondary | ICD-10-CM | POA: Diagnosis present

## 2023-08-01 DIAGNOSIS — Z79899 Other long term (current) drug therapy: Secondary | ICD-10-CM | POA: Diagnosis not present

## 2023-08-01 DIAGNOSIS — K7682 Hepatic encephalopathy: Secondary | ICD-10-CM | POA: Diagnosis not present

## 2023-08-01 DIAGNOSIS — R188 Other ascites: Secondary | ICD-10-CM

## 2023-08-01 DIAGNOSIS — D696 Thrombocytopenia, unspecified: Secondary | ICD-10-CM | POA: Diagnosis not present

## 2023-08-01 DIAGNOSIS — R42 Dizziness and giddiness: Secondary | ICD-10-CM | POA: Diagnosis not present

## 2023-08-01 DIAGNOSIS — M353 Polymyalgia rheumatica: Secondary | ICD-10-CM | POA: Diagnosis present

## 2023-08-01 DIAGNOSIS — E7800 Pure hypercholesterolemia, unspecified: Secondary | ICD-10-CM | POA: Diagnosis present

## 2023-08-01 DIAGNOSIS — K766 Portal hypertension: Secondary | ICD-10-CM | POA: Diagnosis present

## 2023-08-01 DIAGNOSIS — Z91030 Bee allergy status: Secondary | ICD-10-CM

## 2023-08-01 DIAGNOSIS — R58 Hemorrhage, not elsewhere classified: Secondary | ICD-10-CM | POA: Diagnosis not present

## 2023-08-01 DIAGNOSIS — K2970 Gastritis, unspecified, without bleeding: Secondary | ICD-10-CM | POA: Diagnosis present

## 2023-08-01 DIAGNOSIS — J439 Emphysema, unspecified: Secondary | ICD-10-CM | POA: Diagnosis not present

## 2023-08-01 DIAGNOSIS — R1111 Vomiting without nausea: Secondary | ICD-10-CM | POA: Diagnosis not present

## 2023-08-01 DIAGNOSIS — K5521 Angiodysplasia of colon with hemorrhage: Secondary | ICD-10-CM | POA: Diagnosis present

## 2023-08-01 DIAGNOSIS — F17210 Nicotine dependence, cigarettes, uncomplicated: Secondary | ICD-10-CM | POA: Diagnosis present

## 2023-08-01 DIAGNOSIS — K2289 Other specified disease of esophagus: Secondary | ICD-10-CM | POA: Diagnosis present

## 2023-08-01 DIAGNOSIS — K760 Fatty (change of) liver, not elsewhere classified: Secondary | ICD-10-CM | POA: Diagnosis present

## 2023-08-01 DIAGNOSIS — K641 Second degree hemorrhoids: Secondary | ICD-10-CM | POA: Diagnosis present

## 2023-08-01 DIAGNOSIS — M069 Rheumatoid arthritis, unspecified: Secondary | ICD-10-CM | POA: Diagnosis not present

## 2023-08-01 DIAGNOSIS — I8510 Secondary esophageal varices without bleeding: Secondary | ICD-10-CM | POA: Diagnosis not present

## 2023-08-01 DIAGNOSIS — E119 Type 2 diabetes mellitus without complications: Secondary | ICD-10-CM | POA: Diagnosis present

## 2023-08-01 DIAGNOSIS — D124 Benign neoplasm of descending colon: Secondary | ICD-10-CM | POA: Diagnosis present

## 2023-08-01 DIAGNOSIS — F10231 Alcohol dependence with withdrawal delirium: Secondary | ICD-10-CM | POA: Diagnosis not present

## 2023-08-01 DIAGNOSIS — D127 Benign neoplasm of rectosigmoid junction: Secondary | ICD-10-CM | POA: Diagnosis present

## 2023-08-01 DIAGNOSIS — D122 Benign neoplasm of ascending colon: Secondary | ICD-10-CM | POA: Diagnosis present

## 2023-08-01 DIAGNOSIS — I8500 Esophageal varices without bleeding: Secondary | ICD-10-CM | POA: Diagnosis not present

## 2023-08-01 DIAGNOSIS — I2510 Atherosclerotic heart disease of native coronary artery without angina pectoris: Secondary | ICD-10-CM | POA: Diagnosis not present

## 2023-08-01 DIAGNOSIS — E871 Hypo-osmolality and hyponatremia: Secondary | ICD-10-CM | POA: Diagnosis present

## 2023-08-01 DIAGNOSIS — W19XXXA Unspecified fall, initial encounter: Secondary | ICD-10-CM | POA: Diagnosis present

## 2023-08-01 DIAGNOSIS — S0010XA Contusion of unspecified eyelid and periocular area, initial encounter: Secondary | ICD-10-CM | POA: Diagnosis present

## 2023-08-01 DIAGNOSIS — E8770 Fluid overload, unspecified: Secondary | ICD-10-CM | POA: Diagnosis present

## 2023-08-01 DIAGNOSIS — K922 Gastrointestinal hemorrhage, unspecified: Secondary | ICD-10-CM | POA: Diagnosis present

## 2023-08-01 DIAGNOSIS — R55 Syncope and collapse: Secondary | ICD-10-CM | POA: Diagnosis not present

## 2023-08-01 DIAGNOSIS — D62 Acute posthemorrhagic anemia: Secondary | ICD-10-CM | POA: Diagnosis present

## 2023-08-01 DIAGNOSIS — K8020 Calculus of gallbladder without cholecystitis without obstruction: Secondary | ICD-10-CM | POA: Diagnosis present

## 2023-08-01 DIAGNOSIS — K7031 Alcoholic cirrhosis of liver with ascites: Principal | ICD-10-CM | POA: Diagnosis present

## 2023-08-01 DIAGNOSIS — F1010 Alcohol abuse, uncomplicated: Secondary | ICD-10-CM | POA: Diagnosis not present

## 2023-08-01 DIAGNOSIS — R932 Abnormal findings on diagnostic imaging of liver and biliary tract: Secondary | ICD-10-CM | POA: Diagnosis not present

## 2023-08-01 LAB — COMPREHENSIVE METABOLIC PANEL WITH GFR
ALT: 34 U/L (ref 0–44)
AST: 63 U/L — ABNORMAL HIGH (ref 15–41)
Albumin: 3.4 g/dL — ABNORMAL LOW (ref 3.5–5.0)
Alkaline Phosphatase: 106 U/L (ref 38–126)
Anion gap: 8 (ref 5–15)
BUN: 14 mg/dL (ref 8–23)
CO2: 20 mmol/L — ABNORMAL LOW (ref 22–32)
Calcium: 9.2 mg/dL (ref 8.9–10.3)
Chloride: 107 mmol/L (ref 98–111)
Creatinine, Ser: 0.85 mg/dL (ref 0.61–1.24)
GFR, Estimated: >60 mL/min (ref >60)
Glucose, Bld: 116 mg/dL — ABNORMAL HIGH (ref 70–99)
Potassium: 3.6 mmol/L (ref 3.5–5.1)
Sodium: 135 mmol/L (ref 135–145)
Total Bilirubin: 2.8 mg/dL — ABNORMAL HIGH (ref 0.0–1.2)
Total Protein: 6.8 g/dL (ref 6.5–8.1)

## 2023-08-01 LAB — CBC WITH DIFFERENTIAL/PLATELET
Abs Immature Granulocytes: 0.01 K/uL (ref 0.00–0.07)
Basophils Absolute: 0.1 K/uL (ref 0.0–0.1)
Basophils Relative: 1 %
Eosinophils Absolute: 0 K/uL (ref 0.0–0.5)
Eosinophils Relative: 0 %
HCT: 33 % — ABNORMAL LOW (ref 39.0–52.0)
Hemoglobin: 11.1 g/dL — ABNORMAL LOW (ref 13.0–17.0)
Immature Granulocytes: 0 %
Lymphocytes Relative: 23 %
Lymphs Abs: 1.5 K/uL (ref 0.7–4.0)
MCH: 29.4 pg (ref 26.0–34.0)
MCHC: 33.6 g/dL (ref 30.0–36.0)
MCV: 87.3 fL (ref 80.0–100.0)
Monocytes Absolute: 0.8 K/uL (ref 0.1–1.0)
Monocytes Relative: 12 %
Neutro Abs: 4.3 K/uL (ref 1.7–7.7)
Neutrophils Relative %: 64 %
Platelets: 83 K/uL — ABNORMAL LOW (ref 150–400)
RBC: 3.78 MIL/uL — ABNORMAL LOW (ref 4.22–5.81)
RDW: 16.2 % — ABNORMAL HIGH (ref 11.5–15.5)
WBC: 6.8 K/uL (ref 4.0–10.5)
nRBC: 0 % (ref 0.0–0.2)

## 2023-08-01 LAB — PROTIME-INR
INR: 1.2 (ref 0.8–1.2)
Prothrombin Time: 15.7 s — ABNORMAL HIGH (ref 11.4–15.2)

## 2023-08-01 LAB — TYPE AND SCREEN
ABO/RH(D): O POS
Antibody Screen: NEGATIVE

## 2023-08-01 LAB — GLUCOSE, CAPILLARY: Glucose-Capillary: 129 mg/dL — ABNORMAL HIGH (ref 70–99)

## 2023-08-01 MED ORDER — ONDANSETRON HCL 4 MG PO TABS: 4.0000 mg | ORAL_TABLET | Freq: Four times a day (QID) | ORAL | Status: DC | PRN

## 2023-08-01 MED ORDER — PANTOPRAZOLE SODIUM 40 MG IV SOLR
40.0000 mg | Freq: Two times a day (BID) | INTRAVENOUS | Status: DC
  Administered 2023-08-02 – 2023-08-03 (×3): 40 mg via INTRAVENOUS
  Filled 2023-08-01 (×3): qty 10

## 2023-08-01 MED ORDER — ONDANSETRON HCL 4 MG/2ML IJ SOLN
4.0000 mg | Freq: Four times a day (QID) | INTRAMUSCULAR | Status: DC | PRN
  Administered 2023-08-03: 4 mg via INTRAVENOUS

## 2023-08-01 MED ORDER — OCTREOTIDE LOAD VIA INFUSION
50.0000 ug | Freq: Once | INTRAVENOUS | Status: AC
  Administered 2023-08-01: 50 ug via INTRAVENOUS
  Filled 2023-08-01: qty 25

## 2023-08-01 MED ORDER — ACETAMINOPHEN 325 MG PO TABS
650.0000 mg | ORAL_TABLET | Freq: Four times a day (QID) | ORAL | Status: DC | PRN
Start: 2023-08-01 — End: 2023-08-06
  Administered 2023-08-03: 650 mg via ORAL
  Filled 2023-08-01: qty 2

## 2023-08-01 MED ORDER — PANTOPRAZOLE SODIUM 40 MG IV SOLR
40.0000 mg | INTRAVENOUS | Status: AC
  Administered 2023-08-01 (×2): 40 mg via INTRAVENOUS
  Filled 2023-08-01: qty 10

## 2023-08-01 MED ORDER — ONDANSETRON HCL 4 MG/2ML IJ SOLN
4.0000 mg | Freq: Once | INTRAMUSCULAR | Status: AC
  Administered 2023-08-01: 4 mg via INTRAVENOUS
  Filled 2023-08-01: qty 2

## 2023-08-01 MED ORDER — ACETAMINOPHEN 650 MG RE SUPP: 650.0000 mg | Freq: Four times a day (QID) | RECTAL | Status: DC | PRN

## 2023-08-01 MED ORDER — SENNOSIDES-DOCUSATE SODIUM 8.6-50 MG PO TABS: 1.0000 | ORAL_TABLET | Freq: Every evening | ORAL | Status: DC | PRN

## 2023-08-01 MED ORDER — SODIUM CHLORIDE 0.9 % IV SOLN
50.0000 ug/h | INTRAVENOUS | Status: DC
  Administered 2023-08-01 – 2023-08-03 (×3): 50 ug/h via INTRAVENOUS
  Filled 2023-08-01 (×5): qty 1

## 2023-08-01 MED ORDER — SODIUM CHLORIDE 0.9 % IV SOLN
2.0000 g | Freq: Once | INTRAVENOUS | Status: AC
  Administered 2023-08-01: 2 g via INTRAVENOUS
  Filled 2023-08-01: qty 20

## 2023-08-01 NOTE — ED Notes (Signed)
 Pt to ct

## 2023-08-01 NOTE — ED Provider Notes (Signed)
 [place] EMERGENCY DEPARTMENT AT [facility] Provider Note   CSN: [id number] Arrival date & time: 08/01/23  1647     Patient presents with: Rectal Bleeding and GI Problem   Robert Greene is a 63 y.o. male.   63 year old male with a history of alcoholic cirrhosis and rheumatoid arthritis on Plaquenil  who presents emergency department with bright red blood per rectum.  Patient reports that since yesterday has been having multiple loose stools that have been bloody.  Is bright red blood.  No black stools.  Had 2 episodes of nausea and vomiting yesterday as well that were nonbloody nonbilious.  Has not drink alcohol in a week.  Not on blood thinners.  Denies any abdominal pain or fevers.  Did have an episode where he got lightheaded and fell yesterday.  Did hit his head.  Did not lose consciousness       Prior to Admission medications   Medication Sig Start Date End Date Taking? Authorizing Provider  amLODipine  (NORVASC ) 5 MG tablet TAKE 1 TABLET BY MOUTH EVERY DAY 02/21/23   Tolia, Sunit, DO  atorvastatin  (LIPITOR ) 80 MG tablet Take 80 mg by mouth daily. 10/18/16   [provider]  EPINEPHrine  0.3 mg/0.3 mL IJ SOAJ injection Use once.  If ineffective, use 2nd dose. Patient taking differently: Inject 0.3 mg into the muscle as needed for anaphylaxis. 11/19/16   Dean Clarity, MD  etanercept (ENBREL) 50 MG/ML injection Inject 50 mg into the skin once a week.    [provider]  hydroxychloroquine  (PLAQUENIL ) 200 MG tablet Take 200 mg by mouth daily. 02/06/23   [provider]  losartan  (COZAAR ) 100 MG tablet TAKE 1 TABLET BY MOUTH EVERY DAY 09/08/20   Tolia, Sunit, DO  nadolol  (CORGARD ) 20 MG tablet Take 1 tablet (20 mg total) by mouth daily. 05/10/22   Mansouraty, Aloha Raddle., MD  Omega-3 Fatty Acids (FISH OIL PO) Take 2 capsules by mouth 2 (two) times daily.     [provider]  omeprazole  (PRILOSEC) 20 MG capsule Take 1 capsule (20 mg  total) by mouth daily. 05/10/22   Mansouraty, Aloha Raddle., MD    Allergies: Bee venom and Spider antivenin [s black widow (latrodec mactans) antivenin]    Review of Systems  Updated Vital Signs BP (!) 167/89 (BP Location: Right Arm)   Pulse 75   Temp 98.4 F (36.9 C) (Oral)   Resp 20   SpO2 100%   Physical Exam Vitals and nursing note reviewed.  Constitutional:      General: He is not in acute distress.    Appearance: He is well-developed.  HENT:     Head:     Comments: Bruising around right eye from fall Eyes:     Conjunctiva/sclera: Conjunctivae normal.     Pupils: Pupils are equal, round, and reactive to light.     Comments: 4 mm bilaterally  Neck:     Comments: No C-spine tenderness palpation Pulmonary:     Effort: Pulmonary effort is normal. No respiratory distress.  Abdominal:     General: There is distension.     Palpations: Abdomen is soft. There is no mass.     Tenderness: There is no abdominal tenderness. There is no guarding.  Genitourinary:    Comments: Chaperoned by patient's RN Amandip. No external hemorrhoids or fissures noted on external inspection. No internal masses or hemorroids noted. Normal rectal tone.  Gross blood noted.  Musculoskeletal:     Cervical back: Normal  range of motion and neck supple.  Skin:    General: Skin is warm and dry.  Neurological:     Mental Status: He is alert. Mental status is at baseline.  Psychiatric:        Mood and Affect: Mood normal.        Behavior: Behavior normal.     (all labs ordered are listed, but only abnormal results are displayed) Labs Reviewed  COMPREHENSIVE METABOLIC PANEL WITH GFR - Abnormal; Notable for the following components:      Result Value   CO2 20 (*)    Glucose, Bld 116 (*)    Albumin  3.4 (*)    AST 63 (*)    Total Bilirubin 2.8 (*)    All other components within normal limits  CBC WITH DIFFERENTIAL/PLATELET - Abnormal; Notable for the following components:   RBC 3.78 (*)     Hemoglobin 11.1 (*)    HCT 33.0 (*)    RDW 16.2 (*)    Platelets 83 (*)    All other components within normal limits  PROTIME-INR - Abnormal; Notable for the following components:   Prothrombin Time 15.7 (*)    All other components within normal limits  POC OCCULT BLOOD, ED  TYPE AND SCREEN    EKG: None  Radiology: CT Head Wo Contrast Result Date: 08/01/2023 CLINICAL DATA:  Lightheadedness, syncope, fall, vomiting EXAM: CT HEAD WITHOUT CONTRAST TECHNIQUE: Contiguous axial images were obtained from the base of the skull through the vertex without intravenous contrast. RADIATION DOSE REDUCTION: This exam was performed according to the departmental dose-optimization program which includes automated exposure control, adjustment of the mA and/or kV according to patient size and/or use of iterative reconstruction technique. COMPARISON:  04/03/2015 FINDINGS: Brain: No evidence of acute infarction, hemorrhage, hydrocephalus, extra-axial collection or mass lesion/mass effect. Periventricular white matter hypodensity. Vascular: No hyperdense vessel or unexpected calcification. Skull: Normal. Negative for fracture or focal lesion. Sinuses/Orbits: No acute finding. Other: None. IMPRESSION: No acute intracranial pathology. Electronically Signed   By: Marolyn JONETTA Jaksch M.D.   On: 08/01/2023 19:54     Procedures   Medications Ordered in the ED  cefTRIAXone  (ROCEPHIN ) 2 g in sodium chloride  0.9 % 100 mL IVPB (2 g Intravenous New Bag/Given 08/01/23 1957)  octreotide  (SANDOSTATIN ) 2 mcg/mL load via infusion 50 mcg (50 mcg Intravenous Bolus from Bag 08/01/23 1959)    And  octreotide  (SANDOSTATIN ) 500 mcg in sodium chloride  0.9 % 250 mL (2 mcg/mL) infusion (has no administration in time range)  pantoprazole  (PROTONIX ) injection 40 mg (40 mg Intravenous Given 08/01/23 1958)    Followed by  pantoprazole  (PROTONIX ) injection 40 mg (has no administration in time range)  ondansetron  (ZOFRAN ) injection 4 mg (4 mg  Intravenous Given 08/01/23 1954)    Clinical Course as of 08/01/23 2015  Wed Aug 01, 2023  2002 Dr Inocente Hausen from [facility] consulted and is reviewing the patient's chart [RP]    Clinical Course User Index [RP] Yolande Lamar BROCKS, MD                                 Medical Decision Making Amount and/or Complexity of Data Reviewed Labs: ordered. Radiology: ordered.  Risk Prescription drug management. Decision regarding hospitalization.   63 year old male with a history of alcoholic cirrhosis and rheumatoid arthritis on Plaquenil  who presents emergency department with bright red blood per rectum.   Initial Ddx:  Upper  GI bleed, peptic ulcer, lower GI bleed, diverticulosis, malignancy, AVM, hemorrhoids, variceal bleed, symptomatic anemia, SBP, TBI  MDM:  Concerned that the patient may have a GI bleed based on their symptoms.  Does have a history of cirrhosis and esophageal varices.  It is unclear if he has rectal varices as well.  With his bright red blood per rectum suspect a lower GI bleeding source.  Currently is hemodynamically stable.  No abdominal tenderness to palpation suggest SBP.  No apparent bleeding hemorrhoids on exam.  Will go ahead and start the patient on Protonix  and consult GI.  Did not appear to be having symptomatic anemia at this time.  Low concern for variceal bleed given the patient's history.  Will obtain a CT head with this fall as well  Plan:  Labs Type and screen Hemoccult Protonix  Octreotide  Ceftriaxone  INR GI consult CT head  ED Summary/Re-evaluation:  Lab work returned and showed that his hemoglobin is 11 with a baseline of 13.  INR WNL.  Platelets low at 83 but not within the range where I would transfuse him at this point in time.  Discussed with GI who recommends admission to hospitalist and trending CBCs.  They will see the patient but does not feel that he needs emergent endoscopy.  CT head without acute abnormality.  This patient  presents to the ED for concern of complaints listed in HPI, this involves an extensive number of treatment options, and is a complaint that carries with it a high risk of complications and morbidity. Disposition including potential need for admission considered.   Dispo: Admit to Floor   Records reviewed Outpatient Clinic Notes The following labs were independently interpreted: CBC and show acute anemia I independently reviewed the following imaging with scope of interpretation limited to determining acute life threatening conditions related to emergency care: CT Head and agree with the radiologist interpretation with the following exceptions: none I personally reviewed and interpreted cardiac monitoring: normal sinus rhythm  I personally reviewed and interpreted the pt's EKG: see above for interpretation  I have reviewed the patients home medications and made adjustments as needed Consults: Gastroenterology and Hospitalist Social Determinants of health:  EtOH abuse   Final diagnoses:  Rectal bleeding  Cirrhosis of liver with ascites, unspecified hepatic cirrhosis type [facility])    ED Discharge Orders     None          Yolande Lamar BROCKS, MD 08/01/23 2015

## 2023-08-01 NOTE — H&P (Signed)
 History and Physical  Robert Greene FMW:[id number] DOB: 05/02/1960 DOA: 08/01/2023  PCP: [name], MD   Chief Complaint: Rectal bleeding  HPI: Robert Greene is a 63 y.o. male with medical history significant for alcoholic cirrhosis with esophageal varices, HTN, pulmonary fibrosis/ILD, HLD, rheumatoid arthritis who presented to the ED for evaluation of rectal bleeding. Patient reports that since yesterday afternoon, he has had multiple episodes of rectal bleeding. It continued throughout the night so she informed the nurse from his PCP office. He was then advised by his PCP to present to the ED for evaluation. Reports he had 2 episodes of nausea and vomiting with nonbilious nonbloody emesis yesterday. He also reports mild lightheadedness but denies any abdominal pain, chest pain, palpitations, fevers, chills, melena, hemoptysis, hematemesis, rectal pain, confusion or hematuria.  Reports he started drinkin at his last alcohol use 1 week ago.  ED Course: Initial vitals show patient hypertensive with SBP in the 160s to 170s but otherwise normal vital. Initial labs significant for WBC 6.8, Hgb 11.1 (from 13.2 4 months ago), platelet 83 (from 117 4 months ago), normal BUN/creatinine, bilirubin 2.8, INR 1.2. CT head without acute intracranial abnormalities.  Pt received IV Rocephin , IV Protonix  and IV octreotide . GI was consulted for evaluation. [facility] was consulted for admission.   Review of Systems: Please see HPI for pertinent positives and negatives. A complete 10 system review of systems are otherwise negative.  Past Medical History:  Diagnosis Date   Alcoholism (HCC)    Anxiety    Arthritis    rheumatoid   Cirrhosis (HCC)    Coronary artery calcification seen on CAT scan 04/08/2018   Dyspnea    HTN (hypertension) 04/08/2018   Hypercholesteremia    Hypertension    Interstitial lung disease (HCC)    Kidney stones 2020   Pulmonary fibrosis [facility])    Past Surgical History:  Procedure  Laterality Date   KNEE CARTILAGE SURGERY Left    LUNG BIOPSY Right 12/06/2017   Procedure: LUNG BIOPSY;  Surgeon: Kerrin Elspeth BROCKS, MD;  Location: [facility] OR;  Service: Thoracic;  Laterality: Right;   VIDEO ASSISTED THORACOSCOPY Right 12/06/2017   Procedure: VIDEO ASSISTED THORACOSCOPY;  Surgeon: Kerrin Elspeth BROCKS, MD;  Location: [facility] OR;  Service: Thoracic;  Laterality: Right;   Social History:  reports that he has been smoking cigarettes. He has a 60 pack-year smoking history. He has never used smokeless tobacco. He reports current alcohol use of about 1.0 standard drink of alcohol per week. He reports that he does not use drugs.  Allergies  Allergen Reactions   Bee Venom Anaphylaxis, Hives and Rash   Spider Antivenin [S Black Widow (Latrodec Mactans) Antivenin] Anaphylaxis, Hives and Rash    Family History  Problem Relation Age of Onset   Cirrhosis Father    Colon cancer Neg Hx    Stomach cancer Neg Hx    Esophageal cancer Neg Hx    Inflammatory bowel disease Neg Hx    Liver disease Neg Hx    Pancreatic cancer Neg Hx    Rectal cancer Neg Hx      Prior to Admission medications   Medication Sig Start Date End Date Taking? Authorizing Provider  amLODipine  (NORVASC ) 5 MG tablet TAKE 1 TABLET BY MOUTH EVERY DAY 02/21/23   Tolia, Sunit, DO  atorvastatin  (LIPITOR ) 80 MG tablet Take 80 mg by mouth daily. 10/18/16   [provider]  EPINEPHrine  0.3 mg/0.3 mL IJ SOAJ injection Use once.  If ineffective,  use 2nd dose. Patient taking differently: Inject 0.3 mg into the muscle as needed for anaphylaxis. 11/19/16   Dean Clarity, MD  etanercept (ENBREL) 50 MG/ML injection Inject 50 mg into the skin once a week.    [provider]  hydroxychloroquine  (PLAQUENIL ) 200 MG tablet Take 200 mg by mouth daily. 02/06/23   [provider]  losartan  (COZAAR ) 100 MG tablet TAKE 1 TABLET BY MOUTH EVERY DAY 09/08/20   Tolia, Sunit, DO  nadolol  (CORGARD ) 20 MG tablet Take 1  tablet (20 mg total) by mouth daily. 05/10/22   Mansouraty, Aloha Raddle., MD  Omega-3 Fatty Acids (FISH OIL PO) Take 2 capsules by mouth 2 (two) times daily.     [provider]  omeprazole  (PRILOSEC) 20 MG capsule Take 1 capsule (20 mg total) by mouth daily. 05/10/22   Mansouraty, Aloha Raddle., MD    Physical Exam: BP (!) 166/77 (BP Location: Right Arm)   Pulse 67   Temp 99 F (37.2 C) (Oral)   Resp 18   SpO2 100%  General: Pleasant, well-appearing  laying in bed. No acute distress. HEENT: [place]/AT. Anicteric sclera CV: RRR. No murmurs, rubs, or gallops. No LE edema Pulmonary: Lungs CTAB. Normal effort. Fine rales.  Abdominal: Soft, mild distention. Nontender. Normal bowel sounds. Extremities: Palpable radial and DP pulses. Normal ROM. Skin: Warm and dry. No obvious rash or lesions. Neuro: A&Ox3. No asterixis. Moves all extremities. Normal sensation to light touch. No focal deficit. Psych: Normal mood and affect          Labs on Admission:  Basic Metabolic Panel: Recent Labs  Lab 08/01/23 1857  NA 135  K 3.6  CL 107  CO2 20*  GLUCOSE 116*  BUN 14  CREATININE 0.85  CALCIUM  9.2   Liver Function Tests: Recent Labs  Lab 08/01/23 1857  AST 63*  ALT 34  ALKPHOS 106  BILITOT 2.8*  PROT 6.8  ALBUMIN  3.4*   No results for input(s): LIPASE, AMYLASE in the last 168 hours. No results for input(s): AMMONIA in the last 168 hours. CBC: Recent Labs  Lab 08/01/23 1857  WBC 6.8  NEUTROABS 4.3  HGB 11.1*  HCT 33.0*  MCV 87.3  PLT 83*   Cardiac Enzymes: No results for input(s): CKTOTAL, CKMB, CKMBINDEX, TROPONINI in the last 168 hours. BNP (last 3 results) Recent Labs    02/27/23 1741  BNP 22.2    ProBNP (last 3 results) No results for input(s): PROBNP in the last 8760 hours.  CBG: No results for input(s): GLUCAP in the last 168 hours.  Radiological Exams on Admission: CT Head Wo Contrast Result Date: 08/01/2023 CLINICAL DATA:   Lightheadedness, syncope, fall, vomiting EXAM: CT HEAD WITHOUT CONTRAST TECHNIQUE: Contiguous axial images were obtained from the base of the skull through the vertex without intravenous contrast. RADIATION DOSE REDUCTION: This exam was performed according to the departmental dose-optimization program which includes automated exposure control, adjustment of the mA and/or kV according to patient size and/or use of iterative reconstruction technique. COMPARISON:  04/03/2015 FINDINGS: Brain: No evidence of acute infarction, hemorrhage, hydrocephalus, extra-axial collection or mass lesion/mass effect. Periventricular white matter hypodensity. Vascular: No hyperdense vessel or unexpected calcification. Skull: Normal. Negative for fracture or focal lesion. Sinuses/Orbits: No acute finding. Other: None. IMPRESSION: No acute intracranial pathology. Electronically Signed   By: Marolyn JONETTA Jaksch M.D.   On: 08/01/2023 19:54   Assessment/Plan Robert Greene is a 63 y.o. male with medical history significant for alcoholic cirrhosis with esophageal  varices, HTN, pulmonary fibrosis/ILD, HLD, rheumatoid arthritis who presented to the ED for evaluation of rectal bleeding and admitted for GI bleed  # GI bleed - Hgb of 11.1 on admission from baseline of 13-14 - Patient with alcoholic cirrhosis presented with BRBPR over the last 24 hours - Gross blood noted by EDP on rectal exam, Pt hemodynamically stable with no abdominal pain - Concern for possible lower GI bleed with rectal varices high on the differential - GI consulted, will see in a.m.  - Continue IV Protonix  and octreotide  - Continue SBP prophylactic with IV Rocephin  - Follow up iron studies, ferritin and vitamin B12 - Trend CBC and Transfuse for Hgb goal > 7 - N.p.o. at midnight  # Hx of esophageal varices - Identified on CTA in February 2024 - Previously placed on nadolol  this was discontinued in March by GI due to hypotension - Outpatient GI provider  planning for endoscopy for variceal banding at the hospital - GI consulted, appreciate recs  # Alcoholic cirrhosis - MELD-Na score 14, Child-Pugh class B - Reports he has started drinking again, last EtOH use a week ago - Mild abdominal distention but no tenderness - Check U/S RUQ to assess for ascites - SBP prophylaxis with IV Rocephin  as above - Trend LFTs  # HTN - BP initially elevated with SBP in the 160s to 170s - Continue amlodipine  and losartan   # Pulmonary fibrosis/ILD # Emphysema - Endorse mild dyspnea on exertion - Reports he has been referred to the ILD clinic with appointment in September - As needed DuoNebs  # Rheumatoid arthritis # Polymyalgia rheumatica - Follows with [facility] rheumatology [place] - Continue weekly Enbrel injections  # HLD - Continue atorvastatin   # Osteoarthritis - As needed Tylenol   # EtOH abuse - Recent relapse with last EtOH use 1 week ago - TOC consult for substance abuse resources  DVT prophylaxis: SCDs    Code Status: Full Code  Consults called: GI  Family Communication: No family at bedside  Severity of Illness: The appropriate patient status for this patient is INPATIENT. Inpatient status is judged to be reasonable and necessary in order to provide the required intensity of service to ensure the patient's safety. The patient's presenting symptoms, physical exam findings, and initial radiographic and laboratory data in the context of their chronic comorbidities is felt to place them at high risk for further clinical deterioration. Furthermore, it is not anticipated that the patient will be medically stable for discharge from the hospital within 2 midnights of admission.   * I certify that at the point of admission it is my clinical judgment that the patient will require inpatient hospital care spanning beyond 2 midnights from the point of admission due to high intensity of service, high risk for further deterioration and high  frequency of surveillance required.*  Level of care: Telemetry   This record has been created using Conservation officer, historic buildings. Errors have been sought and corrected, but may not always be located. Such creation errors do not reflect on the standard of care.   Lou Claretta HERO, MD 08/01/2023, 10:30 PM [facility] Pager: [phone] Isaiah 41:10   If 7PM-7AM, please contact night-coverage [email or web address] Password [email or web address]

## 2023-08-01 NOTE — ED Triage Notes (Signed)
 Pt BIB by ems from home, Pt experiencing bleeding from rectum since last night. States theres been dark red blood coming from his rectum without any bowl movement. Pt also experienced lightheadedness and syncope episode and hit his face, has a black eye. Has had 2 episodes of vomiting.  1L of LR given by ems.

## 2023-08-02 ENCOUNTER — Other Ambulatory Visit: Payer: Self-pay

## 2023-08-02 ENCOUNTER — Inpatient Hospital Stay (HOSPITAL_COMMUNITY)

## 2023-08-02 DIAGNOSIS — K7031 Alcoholic cirrhosis of liver with ascites: Principal | ICD-10-CM

## 2023-08-02 DIAGNOSIS — F1010 Alcohol abuse, uncomplicated: Secondary | ICD-10-CM

## 2023-08-02 DIAGNOSIS — D62 Acute posthemorrhagic anemia: Secondary | ICD-10-CM

## 2023-08-02 DIAGNOSIS — R188 Other ascites: Secondary | ICD-10-CM

## 2023-08-02 DIAGNOSIS — K625 Hemorrhage of anus and rectum: Principal | ICD-10-CM

## 2023-08-02 DIAGNOSIS — K921 Melena: Secondary | ICD-10-CM

## 2023-08-02 DIAGNOSIS — K922 Gastrointestinal hemorrhage, unspecified: Secondary | ICD-10-CM | POA: Diagnosis not present

## 2023-08-02 LAB — HEMOGLOBIN AND HEMATOCRIT, BLOOD
HCT: 27.7 % — ABNORMAL LOW (ref 39.0–52.0)
Hemoglobin: 9.2 g/dL — ABNORMAL LOW (ref 13.0–17.0)

## 2023-08-02 LAB — COMPREHENSIVE METABOLIC PANEL WITH GFR
ALT: 28 U/L (ref 0–44)
AST: 50 U/L — ABNORMAL HIGH (ref 15–41)
Albumin: 3 g/dL — ABNORMAL LOW (ref 3.5–5.0)
Alkaline Phosphatase: 85 U/L (ref 38–126)
Anion gap: 8 (ref 5–15)
BUN: 16 mg/dL (ref 8–23)
CO2: 20 mmol/L — ABNORMAL LOW (ref 22–32)
Calcium: 8.5 mg/dL — ABNORMAL LOW (ref 8.9–10.3)
Chloride: 103 mmol/L (ref 98–111)
Creatinine, Ser: 1.01 mg/dL (ref 0.61–1.24)
GFR, Estimated: >60 mL/min (ref >60)
Glucose, Bld: 101 mg/dL — ABNORMAL HIGH (ref 70–99)
Potassium: 3.6 mmol/L (ref 3.5–5.1)
Sodium: 131 mmol/L — ABNORMAL LOW (ref 135–145)
Total Bilirubin: 2.2 mg/dL — ABNORMAL HIGH (ref 0.0–1.2)
Total Protein: 5.9 g/dL — ABNORMAL LOW (ref 6.5–8.1)

## 2023-08-02 LAB — PROTIME-INR
INR: 1.3 — ABNORMAL HIGH (ref 0.8–1.2)
Prothrombin Time: 17.3 s — ABNORMAL HIGH (ref 11.4–15.2)

## 2023-08-02 LAB — FERRITIN: Ferritin: 55 ng/mL (ref 24–336)

## 2023-08-02 LAB — CBC
HCT: 27.5 % — ABNORMAL LOW (ref 39.0–52.0)
Hemoglobin: 9.3 g/dL — ABNORMAL LOW (ref 13.0–17.0)
MCH: 29.1 pg (ref 26.0–34.0)
MCHC: 33.8 g/dL (ref 30.0–36.0)
MCV: 85.9 fL (ref 80.0–100.0)
Platelets: 61 K/uL — ABNORMAL LOW (ref 150–400)
RBC: 3.2 MIL/uL — ABNORMAL LOW (ref 4.22–5.81)
RDW: 16.1 % — ABNORMAL HIGH (ref 11.5–15.5)
WBC: 5.1 K/uL (ref 4.0–10.5)
nRBC: 0 % (ref 0.0–0.2)

## 2023-08-02 LAB — IRON AND TIBC
Iron: 151 ug/dL (ref 45–182)
Saturation Ratios: 48 % — ABNORMAL HIGH (ref 17.9–39.5)
TIBC: 314 ug/dL (ref 250–450)
UIBC: 163 ug/dL

## 2023-08-02 LAB — HIV ANTIBODY (ROUTINE TESTING W REFLEX): HIV Screen 4th Generation wRfx: NONREACTIVE

## 2023-08-02 LAB — VITAMIN B12: Vitamin B-12: 952 pg/mL — ABNORMAL HIGH (ref 180–914)

## 2023-08-02 MED ORDER — IPRATROPIUM-ALBUTEROL 0.5-2.5 (3) MG/3ML IN SOLN: 3.0000 mL | Freq: Four times a day (QID) | RESPIRATORY_TRACT | Status: DC | PRN

## 2023-08-02 MED ORDER — HYDROXYCHLOROQUINE SULFATE 200 MG PO TABS
200.0000 mg | ORAL_TABLET | Freq: Every day | ORAL | Status: DC
  Administered 2023-08-02 – 2023-08-06 (×5): 200 mg via ORAL
  Filled 2023-08-02 (×5): qty 1

## 2023-08-02 MED ORDER — SODIUM CHLORIDE 0.9 % IV SOLN
2.0000 g | INTRAVENOUS | Status: DC
  Administered 2023-08-02 – 2023-08-05 (×4): 2 g via INTRAVENOUS
  Filled 2023-08-02 (×4): qty 20

## 2023-08-02 MED ORDER — NA SULFATE-K SULFATE-MG SULF 17.5-3.13-1.6 GM/177ML PO SOLN
0.5000 | Freq: Once | ORAL | Status: DC
Start: 2023-08-03 — End: 2023-08-02
  Filled 2023-08-02: qty 1

## 2023-08-02 MED ORDER — ATORVASTATIN CALCIUM 40 MG PO TABS
80.0000 mg | ORAL_TABLET | Freq: Every day | ORAL | Status: DC
  Administered 2023-08-02 – 2023-08-06 (×5): 80 mg via ORAL
  Filled 2023-08-02 (×5): qty 2

## 2023-08-02 MED ORDER — NA SULFATE-K SULFATE-MG SULF 17.5-3.13-1.6 GM/177ML PO SOLN
0.5000 | Freq: Once | ORAL | Status: AC
  Administered 2023-08-02: 177 mL via ORAL
  Filled 2023-08-02: qty 1

## 2023-08-02 MED ORDER — AMLODIPINE BESYLATE 10 MG PO TABS
5.0000 mg | ORAL_TABLET | Freq: Every day | ORAL | Status: DC
  Administered 2023-08-02 – 2023-08-03 (×2): 5 mg via ORAL
  Filled 2023-08-02 (×2): qty 1

## 2023-08-02 MED ORDER — ALBUMIN HUMAN 25 % IV SOLN
12.5000 g | Freq: Once | INTRAVENOUS | Status: DC
  Filled 2023-08-02: qty 50

## 2023-08-02 MED ORDER — NA SULFATE-K SULFATE-MG SULF 17.5-3.13-1.6 GM/177ML PO SOLN
0.5000 | Freq: Once | ORAL | Status: AC
  Administered 2023-08-03: 177 mL via ORAL

## 2023-08-02 MED ORDER — NA SULFATE-K SULFATE-MG SULF 17.5-3.13-1.6 GM/177ML PO SOLN
0.5000 | Freq: Once | ORAL | Status: AC
  Administered 2023-08-02: 177 mL via ORAL

## 2023-08-02 MED ORDER — SODIUM CHLORIDE 0.9 % IV SOLN: INTRAVENOUS | Status: DC

## 2023-08-02 MED ORDER — LOSARTAN POTASSIUM 50 MG PO TABS
100.0000 mg | ORAL_TABLET | Freq: Every day | ORAL | Status: DC
  Administered 2023-08-02 – 2023-08-03 (×2): 100 mg via ORAL
  Filled 2023-08-02 (×2): qty 2

## 2023-08-02 NOTE — Progress Notes (Signed)
 PROGRESS NOTE  KIEFER OPHEIM  DOB: 04/18/1960  PCP: [name], MD FMW:[id number]  DOA: 08/01/2023  LOS: 1 day  Hospital Day: 2  Brief narrative: Robert Greene is a 63 y.o. male with PMH significant for chronic alcohol use, chronic smoking, alcoholic liver cirrhosis with esophageal varices, ILD/IPF, rheumatoid arthritis, HTN, HLD, CAD, anxiety 7/10, patient was brought to the ED by EMS from home with complaint of multiple episodes of rectal bleeding for about 24 hours.  Also reported associated lightheadedness leading to a syncopal episode, fell forward, hit his face then got a black eye.  In the ED, patient was afebrile, blood pressure in 160s, breathing room air Initial labs showed WBC count 6.8, hemoglobin 11.1, platelet 83, renal function normal, INR 1.2 CT head without acute abnormalities EDP discussed with GI Patient was started on IV Protonix , IV octreotide , IV Rocephin  Admitted to [facility]  Subjective: Patient was seen and examined this morning. Middle-aged Caucasian male. Lying on bed.  Not in distress.  Family not at bedside. Chart reviewed. Remains hemodynamically stable Repeat labs from this morning with hemoglobin down to 9.3, platelet count 61, sodium 131  Assessment and plan: Acute GI bleeding Presented with BRBPR in the setting of known liver cirrhosis GI consulted Currently on Protonix  IV, octreotide  infusion Currently on clear liquid diet.  N.p.o. after midnight for possible intervention tomorrow  Acute blood loss anemia Baseline hemoglobin above 13 from March 2025.  Hemoglobin this morning at 9.3. Continue to monitor.  Plan to transfuse if less than 7 Recent Labs    02/27/23 1721 04/11/23 0900 08/01/23 1857 08/02/23 0407 08/02/23 1248  HGB 14.2 13.2 11.1* 9.3* 9.2*  MCV 94.3 94.4 87.3 85.9  --   VITAMINB12  --   --   --  952*  --   FERRITIN  --   --   --  55  --   TIBC  --   --   --  314  --   IRON  --   --   --  151  --    Chronic alcoholism   Alcoholic liver cirrhosis H/o esophageal varices, portal hypertensive gastropathy, thrombocytopenia RUQ ultrasound this morning showed cirrhotic liver with hepatic steatosis and moderate volume diabetic ascites. Pending EGD Currently on IV Rocephin  for SBP prophylaxis Not on hepatic encephalopathy Platelet count low but stable. Counseled to quit alcohol. Recent Labs  Lab 08/01/23 1857 08/02/23 0407 08/02/23 1248  AST 63* 50*  --   ALT 34 28  --   ALKPHOS 106 85  --   BILITOT 2.8* 2.2*  --   PROT 6.8 5.9*  --   ALBUMIN  3.4* 3.0*  --   INR 1.2  --  1.3*  PLT 83* 61*  --    HTN BP initially elevated with SBP in the 160s to 170s Continue amlodipine  and losartan  Not on beta-blocker  Hyponatremia Sodium level low at 131 today.  In the setting of hypervolemia from portal hypertension. Continue to monitor Recent Labs  Lab 08/01/23 1857 08/02/23 0407  NA 135 131*    Pulmonary fibrosis/ILD Emphysema Chronic daily smoker Endorse mild dyspnea on exertion Reports he has been referred to the ILD clinic with appointment in September As needed DuoNebs Counseled to quit smoking   Rheumatoid arthritis Polymyalgia rheumatica Follows with [facility] rheumatology [place] Continue hydroxychloroquine  200 mg daily Continue weekly Enbrel injections   HLD Continue atorvastatin    Osteoarthritis As needed Tylenol    Mobility: Encourage ambulation.  Reports difficulty lately.  Obtain PT eval  Goals of care   Code Status: Full Code     DVT prophylaxis:  SCDs Start: 08/01/23 2056   Antimicrobials: IV Rocephin  prophylactic Fluid: None Consultants: GI Family Communication: None at bedside  Status: Inpatient Level of care:  Telemetry   Patient is from: Home Needs to continue in-hospital care: Pending EGD tomorrow Anticipated d/c to: Hopefully home in 2 to 3 days   Diet:  Diet Order             Diet NPO time specified  Diet effective midnight           Diet clear liquid  Fluid consistency: Thin  Diet effective now                   Scheduled Meds:  amLODipine   5 mg Oral Daily   atorvastatin   80 mg Oral Daily   hydroxychloroquine   200 mg Oral Daily   losartan   100 mg Oral Daily   Na Sulfate-K Sulfate-Mg Sulfate concentrate  0.5 kit Oral Once   Followed by   Na Sulfate-K Sulfate-Mg Sulfate concentrate  0.5 kit Oral Once   pantoprazole  (PROTONIX ) IV  40 mg Intravenous Q12H    PRN meds: acetaminophen  **OR** acetaminophen , ipratropium-albuterol , ondansetron  **OR** ondansetron  (ZOFRAN ) IV, senna-docusate   Infusions:   albumin  human     cefTRIAXone  (ROCEPHIN )  IV     octreotide  (SANDOSTATIN ) 500 mcg in sodium chloride  0.9 % 250 mL (2 mcg/mL) infusion 50 mcg/hr (08/02/23 1332)    Antimicrobials: Anti-infectives (From admission, onward)    Start     Dose/Rate Route Frequency Ordered Stop   08/02/23 1830  cefTRIAXone  (ROCEPHIN ) 2 g in sodium chloride  0.9 % 100 mL IVPB        2 g 200 mL/hr over 30 Minutes Intravenous Every 24 hours 08/02/23 0315     08/02/23 1515  hydroxychloroquine  (PLAQUENIL ) tablet 200 mg        200 mg Oral Daily 08/02/23 1424     08/01/23 1830  cefTRIAXone  (ROCEPHIN ) 2 g in sodium chloride  0.9 % 100 mL IVPB        2 g 200 mL/hr over 30 Minutes Intravenous  Once 08/01/23 1819 08/01/23 2032       Objective: Vitals:   08/02/23 0555 08/02/23 1208  BP: 136/66 139/79  Pulse: 62 70  Resp: 16 19  Temp: 98.5 F (36.9 C) 98.5 F (36.9 C)  SpO2: 99% 99%    Intake/Output Summary (Last 24 hours) at 08/02/2023 1426 Last data filed at 08/02/2023 9077 Gross per 24 hour  Intake 862.12 ml  Output 400 ml  Net 462.12 ml   Filed Weights   08/01/23 2248  Weight: 88.5 kg   Weight change:  Body mass index is 24.37 kg/m.   Physical Exam: General exam: Pleasant, middle-aged not in distress Skin: No rashes, lesions or ulcers. HEENT: Atraumatic, normocephalic, no obvious bleeding Lungs: Clear to auscultation bilaterally,   CVS: S1, S2, no murmur,   GI/Abd: Soft, nontender, nondistended, bowel sound present,   CNS: Alert, awake, slow to respond but oriented x 3 Psychiatry: Mood appropriate,  Extremities: Trace bilateral pedal edema, no calf tenderness,   Data Review: I have personally reviewed the laboratory data and studies available.  F/u labs ordered Unresulted Labs (From admission, onward)     Start     Ordered   08/03/23 0500  CBC with Differential/Platelet  Tomorrow morning,   R  08/02/23 1426   08/03/23 0500  Comprehensive metabolic panel with GFR  Tomorrow morning,   R        08/02/23 1426   08/03/23 0500  Ammonia  Tomorrow morning,   R        08/02/23 1426   08/02/23 0500  HIV Antibody (routine testing w rflx)  (HIV Antibody (Routine testing w reflex) panel)  Tomorrow morning,   R        08/01/23 2058            Admission date and time: 08/01/2023  4:49 PM    Signed, Chapman Rota, MD [facility] 08/02/2023

## 2023-08-02 NOTE — Consult Note (Addendum)
 Referring Provider: Dr. Chapman Rota Primary Care Physician:  [name], MD Primary Gastroenterologist:  Dr. Wilhelmenia   Reason for Consultation: Cirrhosis, hematochezia  HPI: Robert Greene is a 63 y.o. 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  Patient developed bright red blood per the rectum, vomiting with a near syncopal episode resulting in falling and hitting his face 08/01/2023 therefore he presented to the ED via EMS for further evaluation. Patient noted last alcohol intake was 4 days ago. Labs in the ED showed a WBC count of 6.8.  Hemoglobin 11.1 down from 13.2 three months ago.  Hematocrit 33.0.  Platelets 83.  Sodium 135.  Potassium 3.6.  BUN 14.  Creatinine 0.85.  Albumin  3.4.  Total bili 2.8.  AST 63.  ALT 34.  INR 1.2.  HIV pending. Head CT negative for acute intracranial pathology. RUQ showed cirrhosis with hepatic steatosis, moderate perihepatic ascites, small gallstone with biliary sludge and circumferential gallbladder wall thickening without sonographic evidence of Murphy sign.  Labs today: Hemoglobin 9.3.  Hematocrit 27.5.  Platelets 61.  Sodium 131.  BUN 16.  Creatinine 1.01.  Total bili 2.2.  Alk phos 85.  AST 50.  ALT 28.  Iron 151.  Ferritin 55.  Vitamin B12 952.  MELD 3.0: 16 at 08/02/2023  4:07 AM MELD-Na: 11 at 08/02/2023  4:07 AM Calculated from: Serum Creatinine: 1.01 mg/dL at 2/89/7974  5:92 AM Serum Sodium: 131 mmol/L at 08/02/2023  4:07 AM Total Bilirubin: 2.2 mg/dL at 2/89/7974  5:92 AM Serum Albumin : 3 g/dL at 2/89/7974  5:92 AM INR(ratio): 1.2 at 08/01/2023  6:57 PM Age at listing (hypothetical): 28 years Sex: Male at 08/02/2023  4:07 AM  He  started passing bright red blood per the rectum on Tuesday, 07/31/2023 at approximately 4 PM which she stated went on for 30 hours. Several episodes included small amounts of brown stool and the majority of the episodes is old and passing only blood per the rectum without blood clots. No black stools. No significant abdominal pain but had mild abdominal cramping. Emesis described as nonbloody and nonbilious. No NSAID use. History of alcohol use disorder for which he drinks consistently for a week then stops for several weeks or a month or 2 then relapses. He last drank 3-4 beers on Saturday 7/5 and 1 week prior to that time, he endorsed drinking 2-3 mixed drinks consisting of vodka/orange juice for 4-5 consecutive days. He last passed a small amount of bright red blood per the rectum at 9 PM yesterday evening without further recurrence since then. No GERD symptoms on Omeprazole  20 mg daily. He denies have any chest pain. He is occasional dyspnea with exertion. He sometimes has issues with balance but denies having any confusion.  He underwent an EGD and colonoscopy 07/2021.  The EGD identified grade 2 esophageal varices, portal hypertensive gastropathy, gastritis and nodular mucosa in the duodenum. A future EUS to further evaluate the duodenal nodule was recommended, was not done. The colonoscopy resulted in adequate bowel prep and identified one 6 mm tubular adenomatous polyp removed from the ascending colon and hemorrhoids. A repeat colonoscopy in 1 year was recommended but was not done.  He was last seen in our outpatient GI clinic by Con Blower, PA-C 04/11/2023 for cirrhosis follow-up.  It was noted that  Nadolol  was previously discontinued secondary to hypotension. An EGD for variceal banding was recommended but was not done.  Father with history of cirrhosis.  GI PROCEDURES:  EGD 07/2021 - Nodular mucosa  - ? GEM found in the proximal esophagus. Biopsied.  - No gross lesions in esophagus in middle  esophagus. - Grade II esophageal varices distally.  - Z- line irregular, 41 cm from the incisors.  - 1 cm hiatal hernia.  - Portal hypertensive gastropathy.  - Gastritis. Biopsied.  - Nodular mucosa in the duodenal bulb and in the second portion of the duodenum. Biopsied.  - Submucosal lesion found in D2 ( not clearly a lipoma but not clearly a varix) .   Colonoscopy 07/2021 - Preparation of the colon was inadequate and after extensive lavage we still had poor visualization.  - Hemorrhoids found on digital rectal exam.  - One 6 mm polyp in the ascending colon, removed with a cold snare. Resected and retrieved.  - Non-bleeding non- thrombosed external and internal hemorrhoids.   Diagnosis 1. Surgical [P], gastric REACTIVE GASTROPATHY NEGATIVE FOR H. PYLORI, INTESTINAL METAPLASIA, DYSPLASIA OR CARCINOMA 2. Surgical [P], duodenal REACTIVE DUODENAL MUCOSA WITH GASTRIC METAPLASIA COMPATIBLE WITH PEPTIC DUODENITIS 3. Surgical [P], esophageal lesion REACTIVE SQUAMOUS MUCOSA MINIMAL CHRONIC GASTRITIS NEGATIVE FOR INTESTINAL METAPLASIA, DYSPLASIA AND CARCINOMA (SEE MICROSCOPIC COMMENT) 4. Surgical [P], colon, ascending, polyp (1) TUBULAR ADENOMA NEGATIVE FOR HIGH-GRADE DYSPLASIA AND CARCINOMA   Past Medical History:  Diagnosis Date   Alcoholism (HCC)    Anxiety    Arthritis    rheumatoid   Cirrhosis (HCC)    Coronary artery calcification seen on CAT scan 04/08/2018   Dyspnea    HTN (hypertension) 04/08/2018   Hypercholesteremia    Hypertension    Interstitial lung disease (HCC)    Kidney stones 2020   Pulmonary fibrosis (HCC)     Past Surgical History:  Procedure Laterality Date   KNEE CARTILAGE SURGERY Left    LUNG BIOPSY Right 12/06/2017   Procedure: LUNG BIOPSY;  Surgeon: Kerrin Elspeth BROCKS, MD;  Location: [facility] OR;  Service: Thoracic;  Laterality: Right;   VIDEO ASSISTED THORACOSCOPY Right 12/06/2017   Procedure: VIDEO ASSISTED THORACOSCOPY;  Surgeon: Kerrin Elspeth BROCKS, MD;  Location: [facility] OR;  Service: Thoracic;  Laterality: Right;    Prior to Admission medications   Medication Sig Start Date End Date Taking? Authorizing Provider  amLODipine  (NORVASC ) 5 MG tablet TAKE 1 TABLET BY MOUTH EVERY DAY 02/21/23   Tolia, Sunit, DO  atorvastatin  (LIPITOR ) 80 MG tablet Take 80 mg by mouth daily. 10/18/16   [provider]  EPINEPHrine  0.3 mg/0.3 mL IJ SOAJ injection Use once.  If ineffective, use 2nd dose. Patient taking differently: Inject 0.3 mg into the muscle as needed for anaphylaxis. 11/19/16   Dean Clarity, MD  etanercept (ENBREL) 50 MG/ML injection Inject 50 mg into the skin once a week.    [provider]  hydroxychloroquine  (PLAQUENIL ) 200 MG tablet Take 200 mg by mouth daily. 02/06/23   [provider]  losartan  (COZAAR ) 100 MG tablet TAKE 1 TABLET BY MOUTH EVERY DAY 09/08/20   Tolia, Sunit, DO  nadolol  (CORGARD ) 20 MG tablet Take 1 tablet (20 mg total) by mouth daily. 05/10/22   Mansouraty, Aloha Raddle., MD  Omega-3 Fatty Acids (FISH OIL PO) Take 2 capsules by mouth 2 (two) times daily.     [provider]  omeprazole  (PRILOSEC) 20 MG capsule Take 1 capsule (20 mg total) by mouth daily. 05/10/22  Mansouraty, Aloha Raddle., MD    Current Facility-Administered Medications  Medication Dose Route Frequency Provider Last Rate Last Admin   acetaminophen  (TYLENOL ) tablet 650 mg  650 mg Oral Q6H PRN Amponsah, Prosper M, MD       Or   acetaminophen  (TYLENOL ) suppository 650 mg  650 mg Rectal Q6H PRN Lou Claretta HERO, MD       amLODipine  (NORVASC ) tablet 5 mg  5 mg Oral Daily Amponsah, Prosper M, MD       atorvastatin  (LIPITOR ) tablet 80 mg  80 mg Oral Daily Amponsah, Prosper M, MD       cefTRIAXone  (ROCEPHIN ) 2 g in sodium chloride  0.9 % 100 mL IVPB  2 g Intravenous Q24H Lou Claretta HERO, MD       ipratropium-albuterol  (DUONEB) 0.5-2.5 (3) MG/3ML nebulizer solution 3 mL  3 mL Nebulization Q6H PRN Lou Claretta HERO, MD        losartan  (COZAAR ) tablet 100 mg  100 mg Oral Daily Lou Claretta HERO, MD       octreotide  (SANDOSTATIN ) 500 mcg in sodium chloride  0.9 % 250 mL (2 mcg/mL) infusion  50 mcg/hr Intravenous Continuous Yolande Lamar BROCKS, MD 25 mL/hr at 08/01/23 2032 50 mcg/hr at 08/01/23 2032   ondansetron  (ZOFRAN ) tablet 4 mg  4 mg Oral Q6H PRN Amponsah, Prosper M, MD       Or   ondansetron  (ZOFRAN ) injection 4 mg  4 mg Intravenous Q6H PRN Amponsah, Prosper M, MD       pantoprazole  (PROTONIX ) injection 40 mg  40 mg Intravenous Q12H Yolande Lamar BROCKS, MD   40 mg at 08/02/23 0551   senna-docusate (Senokot-S) tablet 1 tablet  1 tablet Oral QHS PRN Lou Claretta HERO, MD        Allergies as of 08/01/2023 - Review Complete 08/01/2023  Allergen Reaction Noted   Bee venom Anaphylaxis, Hives, and Rash 07/25/2013   Spider antivenin [s black widow (latrodec mactans) antivenin] Anaphylaxis, Hives, and Rash 05/12/2013    Family History  Problem Relation Age of Onset   Cirrhosis Father    Colon cancer Neg Hx    Stomach cancer Neg Hx    Esophageal cancer Neg Hx    Inflammatory bowel disease Neg Hx    Liver disease Neg Hx    Pancreatic cancer Neg Hx    Rectal cancer Neg Hx     Social History   Socioeconomic History   Marital status: Widowed    Spouse name: Not on file   Number of children: 2   Years of education: Not on file   Highest education level: Not on file  Occupational History   Not on file  Tobacco Use   Smoking status: Every Day    Current packs/day: 1.50    Average packs/day: 1.5 packs/day for 40.0 years (60.0 ttl pk-yrs)    Types: Cigarettes   Smokeless tobacco: Never   Tobacco comments:    Pt states he does smoke every now and then but not much. 02/14/22 ALS   Vaping Use   Vaping status: Every Day  Substance and Sexual Activity   Alcohol use: Yes    Alcohol/week: 1.0 standard drink of alcohol    Types: 1 Standard drinks or equivalent per week    Comment: 3 weeks without alcohol    Drug use: No   Sexual activity: Yes    Birth control/protection: None  Other Topics Concern   Not on file  Social History Narrative   Not on  file   Social Drivers of Health   Financial Resource Strain: Not on file  Food Insecurity: No Food Insecurity (08/01/2023)   Hunger Vital Sign    Worried About Running Out of Food in the Last Year: Never true    Ran Out of Food in the Last Year: Never true  Transportation Needs: No Transportation Needs (08/01/2023)   PRAPARE - Administrator, Civil Service (Medical): No    Lack of Transportation (Non-Medical): No  Physical Activity: Not on file  Stress: Not on file  Social Connections: Not on file  Intimate Partner Violence: Not At Risk (08/01/2023)   Humiliation, Afraid, Rape, and Kick questionnaire    Fear of Current or Ex-Partner: No    Emotionally Abused: No    Physically Abused: No    Sexually Abused: No   Review of Systems: Gen: Denies fever, sweats or chills. No weight loss.  CV: Denies chest pain, palpitations or edema. Resp: Denies cough, shortness of breath of hemoptysis.  GI: See HPI. GU : Denies urinary burning, blood in urine, increased urinary frequency or incontinence. MS: Denies joint pain, muscles aches or weakness. Derm: Denies rash, itchiness, skin lesions or unhealing ulcers. Psych: + Anxiety.  Heme: Denies easy bruising, bleeding. Neuro:+ Balance issues. Endo:  Denies any problems with DM, thyroid or adrenal function.  Physical Exam: Vital signs in last 24 hours: Temp:  [98.4 F (36.9 C)-99 F (37.2 C)] 98.5 F (36.9 C) (07/10 0555) Pulse Rate:  [62-75] 62 (07/10 0555) Resp:  [16-20] 16 (07/10 0555) BP: (128-167)/(66-89) 136/66 (07/10 0555) SpO2:  [98 %-100 %] 99 % (07/10 0555) Weight:  [88.5 kg] 88.5 kg (07/09 2248) Last BM Date : 08/01/23 General: Alert 64 male in no acute distress. Head: Ecchymosis to the right lower orbital/maxillary area. Eyes:  No scleral icterus. Conjunctiva pink. Ears:   Normal auditory acuity. Nose:  No deformity, discharge or lesions. Mouth:  Dentition intact. No ulcers or lesions.  Neck:  Supple. No lymphadenopathy or thyromegaly.  Lungs: Breath sounds clear throughout. No wheezes, rhonchi or crackles.  Heart: Regular rate and rhythm, ?  Soft systolic murmur. Abdomen: Abdomen is protuberant with mild ascites. Abdomen is not tense. Nontender. Positive bowel sounds to all 4 quadrants. Rectal: Deferred. Musculoskeletal:  Symmetrical without gross deformities.  Pulses:  Normal pulses noted. Extremities: Trace bilateral lower extremity edema, stasis dermatitis. Neurologic:  Alert and oriented x 4.  Speech is clear.  Moves all extremities equally.  Hands are tremulous without distinct asterixis. Skin: Facial ecchymosis as noted above. Intact without significant lesions or rashes. Psych:  Alert and cooperative. Normal mood and affect.  Intake/Output from previous day: 07/09 0701 - 07/10 0700 In: 262.1 [I.V.:161.4; IV Piggyback:100.7] Out: -  Intake/Output this shift: Total I/O In: -  Out: 400 [Urine:400]  Lab Results: Recent Labs    08/01/23 1857 08/02/23 0407  WBC 6.8 5.1  HGB 11.1* 9.3*  HCT 33.0* 27.5*  PLT 83* 61*   BMET Recent Labs    08/01/23 1857 08/02/23 0407  NA 135 131*  K 3.6 3.6  CL 107 103  CO2 20* 20*  GLUCOSE 116* 101*  BUN 14 16  CREATININE 0.85 1.01  CALCIUM  9.2 8.5*   LFT Recent Labs    08/02/23 0407  PROT 5.9*  ALBUMIN  3.0*  AST 50*  ALT 28  ALKPHOS 85  BILITOT 2.2*   PT/INR Recent Labs    08/01/23 1857  LABPROT 15.7*  INR 1.2  Hepatitis Panel No results for input(s): HEPBSAG, HCVAB, HEPAIGM, HEPBIGM in the last 72 hours.  Studies/Results: CT Head Wo Contrast Result Date: 08/01/2023 CLINICAL DATA:  Lightheadedness, syncope, fall, vomiting EXAM: CT HEAD WITHOUT CONTRAST TECHNIQUE: Contiguous axial images were obtained from the base of the skull through the vertex without intravenous  contrast. RADIATION DOSE REDUCTION: This exam was performed according to the departmental dose-optimization program which includes automated exposure control, adjustment of the mA and/or kV according to patient size and/or use of iterative reconstruction technique. COMPARISON:  04/03/2015 FINDINGS: Brain: No evidence of acute infarction, hemorrhage, hydrocephalus, extra-axial collection or mass lesion/mass effect. Periventricular white matter hypodensity. Vascular: No hyperdense vessel or unexpected calcification. Skull: Normal. Negative for fracture or focal lesion. Sinuses/Orbits: No acute finding. Other: None. IMPRESSION: No acute intracranial pathology. Electronically Signed   By: Marolyn JONETTA Jaksch M.D.   On: 08/01/2023 19:54    IMPRESSION/PLAN:  63 year old admitted with hematochezia and symptomatic anemia. Admission Hg 11.1 (base line Hg 13.2) -> 9.3. Passed a small amount of BRBPR at 9pm 7/9 without further recurrence since then.  Hemodynamically stable. - Clear liquid diet - NPO after midnight  - EGD and colonoscopy 08/03/2023 benefits and risks discussed including risk with sedation, risk of bleeding, perforation and infection  - Continue Octreotide  infusion - Ondansetron  4 mg PR IV every 6 hours as needed - H/H and PT/INR at 12pm - Transfuse for hemoglobin level less than 7 - Abdominal/pelvic CTA if patient develops brisk active GI bleeding - Await further recommendations per Dr. Wilhelmenia  History of alcohol associated decompensated cirrhosis with esophageal varices, ascites hypertensive gastropathy and thrombocytopenia. MELD 3.0: 16.  Total bili 2.2.  Alk phos 35.  AST 50.  ALT 28.  Albumin  3.0.  INR 1.2. RUQ showed cirrhosis with hepatic steatosis, moderate perihepatic ascites. Not on diuretics. Intolerant to Nadolol . No clinical evidence of hepatic encephalopathy. - Clear liquid diet - PPI IV twice daily - EGD to assess for esophageal varices as ordered above -Paracentesis, peritoneal  fluid labs to include cell count with differential, Gram stain, albumin , protein, aerobic/anaerobic culture and cytology. Max fluid to be removed 3 - 4 L.    Administer Albumin  25gm post paracentesis. ADDENDUM: Paracentesis not performed as there was not enough ascites to tap. - Continue Ceftriaxone  2 g IV every 24 hours for SBP prophylaxis in setting of GI bleed in cirrhotic patient - Patient counseled complete alcohol abstinence imperative - Daily PT/INR - CBC, CMP, PT/INR in am - Monitor neurostatus closely, - Recommend CIWA protocol  - Patient counseled complete alcohol abstinence imperative  Gallstones. RUQ sono showed a small gallstone with biliary sludge and circumferential gallbladder wall thickening without sonographic evidence of Murphy's sign.  GERD -PPI bid as noted above   Thrombocytopenia secondary to cirrhosis, prior CT 02/2023 without splenomegaly.  Hyponatremia. NA+ 131. Normal renal function.   History of colon polyps. Colonoscopy 07/2021 identified one 6 mm tubular adenomatous polyp removed from the ascending colon.  History of rheumatoid arthritis on Enbrel and Plaquenil   History of interstitial lung disease     Elida CHRISTELLA Shawl  08/02/2023, 9: 45 AM

## 2023-08-02 NOTE — Plan of Care (Signed)

## 2023-08-02 NOTE — Progress Notes (Signed)
 Patient ID: Robert Greene, male   DOB: 1960-04-13, 64 y.o.   MRN: [id number] Pt presented to US  dept today for paracentesis. On limited US  abd in all 4 quadrants there is only a small amount of ascites noted , primarily perihepatic and not enough to safely aspirate at this time. Procedure cancelled. Pt updated.

## 2023-08-03 ENCOUNTER — Inpatient Hospital Stay (HOSPITAL_COMMUNITY): Admitting: Anesthesiology

## 2023-08-03 ENCOUNTER — Encounter (HOSPITAL_COMMUNITY): Payer: Self-pay | Admitting: Student

## 2023-08-03 ENCOUNTER — Encounter (HOSPITAL_COMMUNITY)
Admission: EM | Disposition: A | Discharge status: Home Health | Payer: Self-pay | Source: Home / Self Care | Attending: Internal Medicine

## 2023-08-03 DIAGNOSIS — I8500 Esophageal varices without bleeding: Secondary | ICD-10-CM

## 2023-08-03 DIAGNOSIS — K3189 Other diseases of stomach and duodenum: Secondary | ICD-10-CM

## 2023-08-03 DIAGNOSIS — D127 Benign neoplasm of rectosigmoid junction: Secondary | ICD-10-CM

## 2023-08-03 DIAGNOSIS — K2289 Other specified disease of esophagus: Secondary | ICD-10-CM

## 2023-08-03 DIAGNOSIS — K766 Portal hypertension: Secondary | ICD-10-CM

## 2023-08-03 DIAGNOSIS — K2970 Gastritis, unspecified, without bleeding: Secondary | ICD-10-CM

## 2023-08-03 DIAGNOSIS — I8510 Secondary esophageal varices without bleeding: Secondary | ICD-10-CM

## 2023-08-03 DIAGNOSIS — K922 Gastrointestinal hemorrhage, unspecified: Secondary | ICD-10-CM | POA: Diagnosis not present

## 2023-08-03 DIAGNOSIS — I868 Varicose veins of other specified sites: Secondary | ICD-10-CM

## 2023-08-03 DIAGNOSIS — K5521 Angiodysplasia of colon with hemorrhage: Secondary | ICD-10-CM

## 2023-08-03 DIAGNOSIS — K7031 Alcoholic cirrhosis of liver with ascites: Secondary | ICD-10-CM

## 2023-08-03 HISTORY — PX: ESOPHAGOGASTRODUODENOSCOPY: SHX5428

## 2023-08-03 HISTORY — PX: COLONOSCOPY: SHX5424

## 2023-08-03 LAB — CBC WITH DIFFERENTIAL/PLATELET
Abs Immature Granulocytes: 0.01 K/uL (ref 0.00–0.07)
Basophils Absolute: 0.1 K/uL (ref 0.0–0.1)
Basophils Relative: 1 %
Eosinophils Absolute: 0.2 K/uL (ref 0.0–0.5)
Eosinophils Relative: 3 %
HCT: 28.2 % — ABNORMAL LOW (ref 39.0–52.0)
Hemoglobin: 9.2 g/dL — ABNORMAL LOW (ref 13.0–17.0)
Immature Granulocytes: 0 %
Lymphocytes Relative: 31 %
Lymphs Abs: 1.8 K/uL (ref 0.7–4.0)
MCH: 28.9 pg (ref 26.0–34.0)
MCHC: 32.6 g/dL (ref 30.0–36.0)
MCV: 88.7 fL (ref 80.0–100.0)
Monocytes Absolute: 0.8 K/uL (ref 0.1–1.0)
Monocytes Relative: 14 %
Neutro Abs: 2.9 K/uL (ref 1.7–7.7)
Neutrophils Relative %: 51 %
Platelets: 57 K/uL — ABNORMAL LOW (ref 150–400)
RBC: 3.18 MIL/uL — ABNORMAL LOW (ref 4.22–5.81)
RDW: 16.1 % — ABNORMAL HIGH (ref 11.5–15.5)
WBC: 5.6 K/uL (ref 4.0–10.5)
nRBC: 0 % (ref 0.0–0.2)

## 2023-08-03 LAB — AMMONIA: Ammonia: 39 umol/L — ABNORMAL HIGH (ref 9–35)

## 2023-08-03 LAB — COMPREHENSIVE METABOLIC PANEL WITH GFR
ALT: 30 U/L (ref 0–44)
AST: 54 U/L — ABNORMAL HIGH (ref 15–41)
Albumin: 2.9 g/dL — ABNORMAL LOW (ref 3.5–5.0)
Alkaline Phosphatase: 82 U/L (ref 38–126)
Anion gap: 7 (ref 5–15)
BUN: 11 mg/dL (ref 8–23)
CO2: 21 mmol/L — ABNORMAL LOW (ref 22–32)
Calcium: 8.6 mg/dL — ABNORMAL LOW (ref 8.9–10.3)
Chloride: 106 mmol/L (ref 98–111)
Creatinine, Ser: 0.77 mg/dL (ref 0.61–1.24)
GFR, Estimated: >60 mL/min (ref >60)
Glucose, Bld: 91 mg/dL (ref 70–99)
Potassium: 3.5 mmol/L (ref 3.5–5.1)
Sodium: 134 mmol/L — ABNORMAL LOW (ref 135–145)
Total Bilirubin: 2.3 mg/dL — ABNORMAL HIGH (ref 0.0–1.2)
Total Protein: 5.7 g/dL — ABNORMAL LOW (ref 6.5–8.1)

## 2023-08-03 LAB — HEMOGLOBIN AND HEMATOCRIT, BLOOD
HCT: 27.9 % — ABNORMAL LOW (ref 39.0–52.0)
Hemoglobin: 9.2 g/dL — ABNORMAL LOW (ref 13.0–17.0)

## 2023-08-03 SURGERY — EGD (ESOPHAGOGASTRODUODENOSCOPY)
Anesthesia: Monitor Anesthesia Care

## 2023-08-03 MED ORDER — THIAMINE HCL 100 MG/ML IJ SOLN: 100.0000 mg | Freq: Every day | INTRAMUSCULAR | Status: DC

## 2023-08-03 MED ORDER — LORAZEPAM 2 MG/ML IJ SOLN: 1.0000 mg | INTRAMUSCULAR | Status: DC | PRN

## 2023-08-03 MED ORDER — PROPOFOL 500 MG/50ML IV EMUL
INTRAVENOUS | Status: DC | PRN
  Administered 2023-08-03 (×3): 20 mg via INTRAVENOUS
  Administered 2023-08-03: 60 mg via INTRAVENOUS
  Administered 2023-08-03 (×2): 20 mg via INTRAVENOUS
  Administered 2023-08-03: 100 ug/kg/min via INTRAVENOUS
  Administered 2023-08-03: 40 mg via INTRAVENOUS

## 2023-08-03 MED ORDER — MIDAZOLAM HCL 5 MG/5ML IJ SOLN
INTRAMUSCULAR | Status: DC | PRN
  Administered 2023-08-03: 2 mg via INTRAVENOUS

## 2023-08-03 MED ORDER — DEXMEDETOMIDINE HCL IN NACL 80 MCG/20ML IV SOLN
INTRAVENOUS | Status: DC | PRN
  Administered 2023-08-03: 8 ug via INTRAVENOUS

## 2023-08-03 MED ORDER — PHENYLEPHRINE HCL (PRESSORS) 10 MG/ML IV SOLN
INTRAVENOUS | Status: DC | PRN
  Administered 2023-08-03: 80 ug via INTRAVENOUS

## 2023-08-03 MED ORDER — MIDAZOLAM HCL 2 MG/2ML IJ SOLN
INTRAMUSCULAR | Status: AC
  Filled 2023-08-03: qty 2

## 2023-08-03 MED ORDER — LIDOCAINE 2% (20 MG/ML) 5 ML SYRINGE
INTRAMUSCULAR | Status: DC | PRN
  Administered 2023-08-03: 60 mg via INTRAVENOUS

## 2023-08-03 MED ORDER — PROPOFOL 1000 MG/100ML IV EMUL
INTRAVENOUS | Status: AC
  Filled 2023-08-03: qty 400

## 2023-08-03 MED ORDER — THIAMINE MONONITRATE 100 MG PO TABS
100.0000 mg | ORAL_TABLET | Freq: Every day | ORAL | Status: DC
  Administered 2023-08-03 – 2023-08-06 (×4): 100 mg via ORAL
  Filled 2023-08-03 (×4): qty 1

## 2023-08-03 MED ORDER — LORAZEPAM 1 MG PO TABS: 1.0000 mg | ORAL_TABLET | ORAL | Status: DC | PRN

## 2023-08-03 MED ORDER — CHLORDIAZEPOXIDE HCL 25 MG PO CAPS
25.0000 mg | ORAL_CAPSULE | Freq: Four times a day (QID) | ORAL | Status: AC
  Administered 2023-08-03 – 2023-08-04 (×4): 25 mg via ORAL
  Filled 2023-08-03 (×4): qty 1

## 2023-08-03 MED ORDER — FOLIC ACID 1 MG PO TABS
1.0000 mg | ORAL_TABLET | Freq: Every day | ORAL | Status: DC
  Administered 2023-08-03 – 2023-08-06 (×4): 1 mg via ORAL
  Filled 2023-08-03 (×4): qty 1

## 2023-08-03 MED ORDER — PANTOPRAZOLE SODIUM 40 MG PO TBEC
40.0000 mg | DELAYED_RELEASE_TABLET | Freq: Two times a day (BID) | ORAL | Status: DC
  Administered 2023-08-03 – 2023-08-06 (×7): 40 mg via ORAL
  Filled 2023-08-03 (×8): qty 1

## 2023-08-03 MED ORDER — ADULT MULTIVITAMIN W/MINERALS CH
1.0000 | ORAL_TABLET | Freq: Every day | ORAL | Status: DC
  Administered 2023-08-03 – 2023-08-06 (×4): 1 via ORAL
  Filled 2023-08-03 (×4): qty 1

## 2023-08-03 MED ORDER — PROPOFOL 500 MG/50ML IV EMUL
INTRAVENOUS | Status: AC
Start: 2023-08-03 — End: 2023-08-03
  Filled 2023-08-03: qty 50

## 2023-08-03 MED ORDER — SUCRALFATE 1 GM/10ML PO SUSP
1.0000 g | Freq: Two times a day (BID) | ORAL | Status: DC
  Administered 2023-08-03 – 2023-08-06 (×7): 1 g via ORAL
  Filled 2023-08-03 (×8): qty 10

## 2023-08-03 NOTE — Transfer of Care (Signed)
 Immediate Anesthesia Transfer of Care Note  [name]  Procedure(s) Performed: EGD (ESOPHAGOGASTRODUODENOSCOPY) COLONOSCOPY  Patient Location: PACU  Anesthesia Type:MAC  Level of Consciousness: awake and patient cooperative  Airway & Oxygen Therapy: Patient Spontanous Breathing and Patient connected to face mask oxygen  Post-op Assessment: Report given to RN and Post -op Vital signs reviewed and stable  Post vital signs: Reviewed and stable  Last Vitals:  Vitals Value Taken Time  BP 112/92 08/03/23 15:06  Temp    Pulse 79 08/03/23 15:08  Resp 26 08/03/23 15:08  SpO2 100 % 08/03/23 15:08  Vitals shown include unfiled device data.  Last Pain:  Vitals:   08/03/23 1504  TempSrc:   PainSc: Asleep         Complications: No notable events documented.

## 2023-08-03 NOTE — Plan of Care (Signed)

## 2023-08-03 NOTE — Progress Notes (Addendum)
 Brief GI Progress Note:  Patient completed Suprep x 3 dose, last dose completed at 5 AM. Patient passing clear water stool with very beige colored sediment with a small amount of red blood as seen in the bedside commode at this time.  Patient endorsed passing moderate amount of bright red blood with several watery bowel movements overnight/early this morning.  He has intermittent left mid abdominal pain, not severe.  He denies having any chest pain, shortness of breath or dizziness.  He is alert and oriented.  Heart rhythm regular without murmur.  Breath sounds are clear.  Abdomen nondistended and nontender at this time.  Stable hemoglobin 9.2 at 4 AM.  Platelets 57.  Sodium 134, K + 3.5. Patient to proceed with EGD and colonoscopy this afternoon with Dr. Wilhelmenia as planned. NS IV @ 75 cc/hr. Remains on Octreotide  infusion. Check H/H at 10 am. I spoke to his nurse, Lauren, to monitor patient closely and to contact our GI service if his hematochezia worsens.

## 2023-08-03 NOTE — Progress Notes (Signed)
 PROGRESS NOTE  Robert Greene  DOB: [birth year]  PCP: [name], MD FMW:[id number]  DOA: 08/01/2023  LOS: 2 days  Hospital Day: 3  Brief narrative: Robert Greene is a 63 y.o. male with PMH significant for chronic alcohol use, chronic smoking, alcoholic liver cirrhosis with esophageal varices, ILD/IPF, rheumatoid arthritis, HTN, HLD, CAD, anxiety 7/10, patient was brought to the ED by EMS from home with complaint of multiple episodes of rectal bleeding for about 24 hours.  Also reported associated lightheadedness leading to a syncopal episode, fell forward, hit his face then got a black eye.  In the ED, patient was afebrile, blood pressure in 160s, breathing room air Initial labs showed WBC count 6.8, hemoglobin 11.1, platelet 83, renal function normal, INR 1.2 CT head without acute abnormalities EDP discussed with GI Patient was started on IV Protonix , IV octreotide , IV Rocephin  Admitted to [facility]  7/11, EGD showed Grade I, grade II and grade III esophageal varices with no bleeding and evidence of red wale signs on grade 3 varices. Banded. Completely eradicated.  Also had portal hypertensive gastropathy. 7/11, colonoscopy showed - Hematin in the entire examined colon. Lavaged copiously. - A single mildly oozing colonic spot versus AVM s/p APC - Multiple 1 to 5 mm polyps at the recto-sigmoid colon, in the sigmoid colon and in the descending colon. Resection not attempted. -Small rectal varix - Non-bleeding non-thrombosed external and internal hemorrhoids.                Subjective: Patient was seen and examined this morning.  Lying down in bed.  Not in distress.  Felt weak. Earlier this afternoon, he underwent Hemodynamically stable Lab this morning with hemoglobin stable at 9.2, ammonia 39   Assessment and plan: Acute GI bleeding Acute blood loss anemia Presented with BRBPR, low hemoglobin in the setting of known liver cirrhosis Initially started on IV Protonix , octreotide   infusion 7/11, underwent EGD and colonoscopy -findings as above Per GI recommendation, started on liquid diet postprocedure Per GI, etiology of patient's bleeding is not clearly defined with EGD and colonoscopy.  The small AVM in the right colon probably is not enough of a cause.  VCE planned for tomorrow. Also needs proctosigmoidoscopy in the future for removal of polyps as they were not removed in the setting of already existing GI bleeding. Hemoglobin trend as below.  Stable above 9 for 2 days Continue to monitor.  Plan to transfuse if less than 7 Recent Labs    08/01/23 1857 08/02/23 0407 08/02/23 1248 08/03/23 0406 08/03/23 0948  HGB 11.1* 9.3* 9.2* 9.2* 9.2*  MCV 87.3 85.9  --  88.7  --   VITAMINB12  --  952*  --   --   --   FERRITIN  --  55  --   --   --   TIBC  --  314  --   --   --   IRON  --  151  --   --   --    Chronic alcoholism  Alcohol withdrawal symptoms This afternoon, patient is having tremors and confusion. Probably he was drinking more than what he reported. Start CIWA protocol with scheduled Librium  and as needed Ativan . Counseled to quit alcohol  Alcoholic liver cirrhosis H/o esophageal varices, portal hypertensive gastropathy, thrombocytopenia RUQ ultrasound showed cirrhotic liver with hepatic steatosis and moderate volume ascites. Ultrasound RUQ showed small amount of ascites, primarily perihepatic and not safe window for paracentesis. Currently on IV Rocephin  for SBP prophylaxis  Ammonia level mildly elevated.  Had bowel movement with colonoscopy prep.  In long-term, he will benefit from lactulose . Platelet count low but stable. Recent Labs  Lab 08/01/23 1857 08/02/23 0407 08/02/23 1248 08/03/23 0406  AST 63* 50*  --  54*  ALT 34 28  --  30  ALKPHOS 106 85  --  82  BILITOT 2.8* 2.2*  --  2.3*  PROT 6.8 5.9*  --  5.7*  ALBUMIN  3.4* 3.0*  --  2.9*  AMMONIA  --   --   --  39*  INR 1.2  --  1.3*  --   PLT 83* 61*  --  57*   HTN BP initially  elevated with SBP in the 160s to 170s Currently on amlodipine  and losartan  Not on beta-blocker Given portal hypertension, would benefit from beta-blocker, I would hold amlodipine  and losartan  today to create room for portal hypertension regimen.  Hyponatremia Sodium mildly low.  Trend as below in the setting of hypervolemia from portal hypertension. Continue to monitor Recent Labs  Lab 08/01/23 1857 08/02/23 0407 08/03/23 0406  NA 135 131* 134*    Pulmonary fibrosis/ILD Emphysema Chronic daily smoker Endorse mild dyspnea on exertion Reports he has been referred to the ILD clinic with appointment in September As needed DuoNebs Counseled to quit smoking   Rheumatoid arthritis Polymyalgia rheumatica Follows with [facility] rheumatology [facility] Continue hydroxychloroquine  200 mg daily Continue weekly Enbrel injections since   HLD Continue atorvastatin    Osteoarthritis As needed Tylenol    Mobility: Encourage ambulation.  PT recommended home with PT  Goals of care   Code Status: Full Code     DVT prophylaxis:  SCDs Start: 08/01/23 2056   Antimicrobials: IV Rocephin  prophylactic Fluid: None Consultants: GI Family Communication: None at bedside  Status: Inpatient Level of care:  Telemetry   Patient is from: Home Needs to continue in-hospital care: Pending EGD tomorrow Anticipated d/c to: Hopefully home in 2 to 3 days   Diet:  Diet Order             Diet full liquid Fluid consistency: Thin  Diet effective now                   Scheduled Meds:  atorvastatin   80 mg Oral Daily   chlordiazePOXIDE   25 mg Oral QID   folic acid   1 mg Oral Daily   hydroxychloroquine   200 mg Oral Daily   multivitamin with minerals  1 tablet Oral Daily   pantoprazole   40 mg Oral BID   sucralfate   1 g Oral BID   thiamine   100 mg Oral Daily   Or   thiamine   100 mg Intravenous Daily    PRN meds: acetaminophen  **OR** acetaminophen , ipratropium-albuterol , LORazepam  **OR**  LORazepam , ondansetron  **OR** ondansetron  (ZOFRAN ) IV, senna-docusate   Infusions:   cefTRIAXone  (ROCEPHIN )  IV 2 g (08/02/23 1805)    Antimicrobials: Anti-infectives (From admission, onward)    Start     Dose/Rate Route Frequency Ordered Stop   08/02/23 1830  cefTRIAXone  (ROCEPHIN ) 2 g in sodium chloride  0.9 % 100 mL IVPB        2 g 200 mL/hr over 30 Minutes Intravenous Every 24 hours 08/02/23 0315     08/02/23 1515  hydroxychloroquine  (PLAQUENIL ) tablet 200 mg        200 mg Oral Daily 08/02/23 1424     08/01/23 1830  cefTRIAXone  (ROCEPHIN ) 2 g in sodium chloride  0.9 % 100 mL IVPB  2 g 200 mL/hr over 30 Minutes Intravenous  Once 08/01/23 1819 08/01/23 2032       Objective: Vitals:   08/03/23 1525 08/03/23 1558  BP: 126/61 130/72  Pulse: 67 (!) 58  Resp: 20 18  Temp:  98 F (36.7 C)  SpO2: 97% 99%    Intake/Output Summary (Last 24 hours) at 08/03/2023 1623 Last data filed at 08/03/2023 1458 Gross per 24 hour  Intake 1627.06 ml  Output 700 ml  Net 927.06 ml   Filed Weights   08/01/23 2248 08/03/23 1252  Weight: 88.5 kg 88.5 kg   Weight change:  Body mass index is 24.39 kg/m.   Physical Exam this morning: General exam: Pleasant, middle-aged not in distress Skin: No rashes, lesions or ulcers. HEENT: Atraumatic, normocephalic, no obvious bleeding Lungs: Clear to auscultation bilaterally,  CVS: S1, S2, no murmur,   GI/Abd: Soft, nontender, nondistended, bowel sound present,   CNS: Little slow to respond.  No tremors in the morning but developed later in the afternoon Psychiatry: Sad affect Extremities: Trace bilateral pedal edema, no calf tenderness,   Data Review: I have personally reviewed the laboratory data and studies available.  F/u labs ordered Unresulted Labs (From admission, onward)     Start     Ordered   08/04/23 0500  CBC with Differential/Platelet  Tomorrow morning,   R        08/03/23 1623   08/04/23 0500  Comprehensive metabolic panel  with GFR  Tomorrow morning,   R        08/03/23 1623   08/04/23 0500  Ammonia  Tomorrow morning,   R        08/03/23 1623            Admission date and time: 08/01/2023  4:49 PM    Signed, Chapman Rota, MD [facility] 08/03/2023

## 2023-08-03 NOTE — Evaluation (Signed)
 Physical Therapy Evaluation Patient Details Name: Robert Greene MRN: [id number] DOB: 06/02/1960 Today's Date: 08/03/2023  History of Present Illness  Pt is 63 yo male admitted on 08/01/23 with acute GI bleeding and anemia, fall, syncope.  Pt scheduled for EGD and colonscopy.  Pt with hx including but not limited to chronic alcohol use, chronic smoking, alcoholic liver cirrhosis with esophageal varices, ILD/IPF, rheumatoid arthritis, HTN, HLD, CAD, anxiety  Clinical Impression  Pt admitted with above diagnosis. At baseline, pt independent with adls, light iadls, and ambulatory without AD.  He does report recent low energy and falls.  Today, pt prepping for colonscopy and has been up/down frequently to [facility].  He was able to ambulate 35' with IV pole and CGA for safety. Pt had no overt LOB but did demonstrate instability with wider BOS and using UE support when able.   Pt currently with functional limitations due to the deficits listed below (see PT Problem List). Pt will benefit from acute skilled PT to increase their independence and safety with mobility to allow discharge.  With hx of recent falls and weakness would recommend HHPT at d/c (pt reports issues with transportation for outpt)         If plan is discharge home, recommend the following: Assistance with cooking/housework;Help with stairs or ramp for entrance   Can travel by private vehicle        Equipment Recommendations None recommended by PT  Recommendations for Other Services       Functional Status Assessment Patient has had a recent decline in their functional status and demonstrates the ability to make significant improvements in function in a reasonable and predictable amount of time.     Precautions / Restrictions Precautions Precautions: Fall      Mobility  Bed Mobility Overal bed mobility: Needs Assistance Bed Mobility: Supine to Sit, Sit to Supine     Supine to sit: Modified independent (Device/Increase  time) Sit to supine: Modified independent (Device/Increase time)        Transfers Overall transfer level: Needs assistance Equipment used: None Transfers: Sit to/from Stand, Bed to chair/wheelchair/BSC Sit to Stand: Supervision   Step pivot transfers: Supervision       General transfer comment: Pt does hold bed rail upon initial standing    Ambulation/Gait Ambulation/Gait assistance: Contact guard assist Gait Distance (Feet): 80 Feet Assistive device: IV Pole Gait Pattern/deviations: Step-through pattern, Trunk flexed, Wide base of support Gait velocity: decreased     General Gait Details: Ambulates with wide BOS and trunk flexed.  No overt LOB but does have hx of recent falls.  Stairs            Wheelchair Mobility     Tilt Bed    Modified Rankin (Stroke Patients Only)       Balance Overall balance assessment: Needs assistance Sitting-balance support: No upper extremity supported Sitting balance-Leahy Scale: Good     Standing balance support: Single extremity supported, No upper extremity supported Standing balance-Leahy Scale: Fair Standing balance comment: Could static stand and perform ADLs without support but prefers UE support with mobility                             Pertinent Vitals/Pain Pain Assessment Pain Assessment: No/denies pain    Home Living Family/patient expects to be discharged to:: Private residence Living Arrangements: Alone Available Help at Discharge: Family;Available PRN/intermittently Type of Home: House Home Access: Stairs to enter Entrance  Stairs-Rails: Right Entrance Stairs-Number of Steps: 3   Home Layout: One level Home Equipment: Educational psychologist (4 wheels);Cane - single point;Grab bars - tub/shower      Prior Function Prior Level of Function : Independent/Modified Independent;History of Falls (last six months)             Mobility Comments: Pt reports independent with mobility and not  using AD recently.  Does report furniture surfing or at least stabilizing upon standing.  He does report low energy and several recent falls. Pt not able to state what caused his falls- does seems like fall at admission due to syncope, reports has fallen on stairs.  Asked about lightheadedness or dizziness and he stated not really , more imbalance ADLs Comments: independent with adls and light iadls; again reports low energy recently     Extremity/Trunk Assessment   Upper Extremity Assessment Upper Extremity Assessment: Overall WFL for tasks assessed    Lower Extremity Assessment Lower Extremity Assessment: Overall WFL for tasks assessed    Cervical / Trunk Assessment Cervical / Trunk Assessment: Normal  Communication        Cognition Arousal: Alert Behavior During Therapy: WFL for tasks assessed/performed   PT - Cognitive impairments: No apparent impairments                                 Cueing       General Comments      Exercises     Assessment/Plan    PT Assessment Patient needs continued PT services  PT Problem List Decreased strength;Decreased mobility;Decreased range of motion;Decreased activity tolerance;Decreased balance;Decreased knowledge of use of DME       PT Treatment Interventions DME instruction;Therapeutic exercise;Gait training;Balance training;Stair training;Functional mobility training;Therapeutic activities;Patient/family education;Neuromuscular re-education    PT Goals (Current goals can be found in the Care Plan section)  Acute Rehab PT Goals Patient Stated Goal: return home; get stronger PT Goal Formulation: With patient Time For Goal Achievement: 08/17/23 Potential to Achieve Goals: Good Additional Goals Additional Goal #1: Pt will score >19 on DGI to indicate lower fall risk    Frequency Min 1X/week     Co-evaluation               AM-PAC PT 6 Clicks Mobility  Outcome Measure Help needed turning from your  back to your side while in a flat bed without using bedrails?: None Help needed moving from lying on your back to sitting on the side of a flat bed without using bedrails?: None Help needed moving to and from a bed to a chair (including a wheelchair)?: A Little Help needed standing up from a chair using your arms (e.g., wheelchair or bedside chair)?: A Little Help needed to walk in hospital room?: A Little Help needed climbing 3-5 steps with a railing? : A Little 6 Click Score: 20    End of Session Equipment Utilized During Treatment: Gait belt Activity Tolerance: Patient tolerated treatment well Patient left: in bed;with call bell/phone within reach;with bed alarm set (4th floor - automatic alarm) Nurse Communication: Mobility status PT Visit Diagnosis: Other abnormalities of gait and mobility (R26.89);Muscle weakness (generalized) (M62.81);History of falling (Z91.81)    Time: 8893-8872 PT Time Calculation (min) (ACUTE ONLY): 21 min   Charges:   PT Evaluation $PT Eval Low Complexity: 1 Low   PT General Charges $$ ACUTE PT VISIT: 1 Visit  Manar Smalling, PT Acute Rehab [facility] [phone]   Robert Greene 08/03/2023, 11:41 AM

## 2023-08-03 NOTE — Anesthesia Postprocedure Evaluation (Signed)
 Anesthesia Post Note  [name]  Procedure(s) Performed: EGD (ESOPHAGOGASTRODUODENOSCOPY) COLONOSCOPY     Patient location during evaluation: PACU Anesthesia Type: MAC Level of consciousness: awake Pain management: pain level controlled Vital Signs Assessment: post-procedure vital signs reviewed and stable Respiratory status: spontaneous breathing, nonlabored ventilation and respiratory function stable Cardiovascular status: stable and blood pressure returned to baseline Postop Assessment: no apparent nausea or vomiting Anesthetic complications: no   No notable events documented.  Last Vitals:  Vitals:   08/03/23 1520 08/03/23 1525  BP:  126/61  Pulse: 70 67  Resp: 20 20  Temp:    SpO2: 98% 97%    Last Pain:  Vitals:   08/03/23 1525  TempSrc:   PainSc: 0-No pain                 Robert Greene

## 2023-08-03 NOTE — Anesthesia Preprocedure Evaluation (Addendum)
 Anesthesia Evaluation  Patient identified by MRN, date of birth, ID band Patient awake    Reviewed: Allergy & Precautions, NPO status , Patient's Chart, lab work & pertinent test results, reviewed documented beta blocker date and time   Airway Mallampati: II  TM Distance: >3 FB     Dental  (+) Poor Dentition, Dental Advisory Given   Pulmonary shortness of breath and with exertion, Current Smoker and Patient abstained from smoking. ILD Pulmonary fibrosis   Pulmonary exam normal breath sounds clear to auscultation       Cardiovascular hypertension, Pt. on medications and Pt. on home beta blockers + CAD  Normal cardiovascular exam Rhythm:Regular Rate:Normal     Neuro/Psych  PSYCHIATRIC DISORDERS Anxiety     negative neurological ROS     GI/Hepatic ,,,(+) Cirrhosis   Esophageal Varices  substance abuse  alcohol useGi Bleed   Endo/Other  HLD  Renal/GU Renal diseaseHx/o renal calculi  negative genitourinary   Musculoskeletal  (+) Arthritis , Osteoarthritis,    Abdominal   Peds  Hematology  (+) Blood dyscrasia, anemia   Anesthesia Other Findings   Reproductive/Obstetrics                              Anesthesia Physical Anesthesia Plan  ASA: 3  Anesthesia Plan: MAC   Post-op Pain Management: Minimal or no pain anticipated   Induction: Intravenous  PONV Risk Score and Plan: 1 and Treatment may vary due to age or medical condition and Propofol  infusion  Airway Management Planned: Natural Airway and Simple Face Mask  Additional Equipment: None  Intra-op Plan:   Post-operative Plan:   Informed Consent: I have reviewed the patients History and Physical, chart, labs and discussed the procedure including the risks, benefits and alternatives for the proposed anesthesia with the patient or authorized representative who has indicated his/her understanding and acceptance.     Dental  advisory given  Plan Discussed with: Anesthesiologist and CRNA  Anesthesia Plan Comments:          Anesthesia Quick Evaluation

## 2023-08-03 NOTE — Op Note (Signed)
 [facility] Patient Name: Robert Greene Procedure Date: 08/03/2023 MRN: [id number] Attending MD: Aloha Finner , MD, [id number] Date of Birth: November 10, 1960 CSN: [id number] Age: 63 Admit Type: Inpatient Procedure:                Upper GI endoscopy Indications:              Acute post hemorrhagic anemia, Hematochezia,                            Follow-up of esophageal varices, Nausea with                            vomiting Providers:                Aloha Finner, MD, Randall Lines, RN, Lorrayne Kitty, Technician Referring MD:              Medicines:                Monitored Anesthesia Care Complications:            No immediate complications. Estimated Blood Loss:     Estimated blood loss was minimal. Procedure:                Pre-Anesthesia Assessment:                           - Prior to the procedure, a History and Physical                            was performed, and patient medications and                            allergies were reviewed. The patient's tolerance of                            previous anesthesia was also reviewed. The risks                            and benefits of the procedure and the sedation                            options and risks were discussed with the patient.                            All questions were answered, and informed consent                            was obtained. Prior Anticoagulants: The patient has                            taken no anticoagulant or antiplatelet agents. ASA                            Grade Assessment: III -  A patient with severe                            systemic disease. After reviewing the risks and                            benefits, the patient was deemed in satisfactory                            condition to undergo the procedure.                           After obtaining informed consent, the endoscope was                            passed under direct vision.  Throughout the                            procedure, the patient's blood pressure, pulse, and                            oxygen saturations were monitored continuously. The                            [id number] ([id number]) Olympus therapeutic endoscope                            was introduced through the mouth, and advanced to                            the second part of duodenum. The upper GI endoscopy                            was accomplished without difficulty. The patient                            tolerated the procedure. Scope In: Scope Out: Findings:      No gross lesions were noted in the proximal esophagus and in the mid       esophagus.      Three columns of grade I, grade II, grade III varices with no bleeding       and no stigmata of recent bleeding were found in the distal esophagus       and at the gastroesophageal junction. Red wale signs were present. Three       bands were successfully placed with complete eradication, resulting in       deflation of varices. There was no bleeding during and at the end of the       procedure.      The Z-line was irregular and was found 41 cm from the incisors.      A 1 cm hiatal hernia was present.      Moderate, diffuse portal hypertensive gastropathy was found in the       entire examined stomach.      Patchy mildly erythematous mucosa without bleeding was found in the  entire examined stomach. Biopsies were taken with a cold forceps for       histology and Helicobacter pylori testing.      A single 18 mm subepithelial nodule was found in the second portion of       the duodenum.      No other gross lesions were noted in the duodenal bulb, in the first       portion of the duodenum and in the second portion of the duodenum. Impression:               - No gross lesions in the proximal esophagus and in                            the mid esophagus.                           - Grade I, grade II and grade III esophageal                             varices with no bleeding and evidence of red wale                            signs on grade 3 varices. Banded. Completely                            eradicated.                           - Z-line irregular, 41 cm from the incisors.                           - 1 cm hiatal hernia.                           - Portal hypertensive gastropathy.                           - Erythematous mucosa in the stomach. Biopsied.                           - Subepithelial nodule found in duodenum to.                           - No other gross lesions in the duodenal bulb, in                            the first portion of the duodenum and in the second                            portion of the duodenum. Moderate Sedation:      Not Applicable - Patient had care per Anesthesia. Recommendation:           - Proceed to scheduled colonoscopy.                           - Observe patient's clinical course.                           -  Await pathology results.                           - Transition IV PPI to p.o. PPI twice daily.                           - Complete 5-day course of ceftriaxone  from date of                            admission for infectious complication risk in                            setting of underlying cirrhosis with GI bleeding                            (even though not variceal in nature).                           - Carafate  1 g twice daily for 2 weeks.                           - Repeat EGD in 3 to 5 weeks for repeat banding                            protocol.                           - At time of a follow-up endoscopy will consider                            EUS once banding protocol has been completed.                           - Complete alcohol abstinence.                           - If continuing to tolerate nonselective                            beta-blocker, if on follow-up patient has                            improvement of varices to grade 1 or grade 2 we  may                            not repeat banding.                           - The findings and recommendations were discussed                            with the patient.                           - The findings and recommendations  were discussed                            with the referring physician. Procedure Code(s):        --- Professional ---                           [phone], Esophagogastroduodenoscopy, flexible,                            transoral; with band ligation of esophageal/gastric                            varices                           43239, Esophagogastroduodenoscopy, flexible,                            transoral; with biopsy, single or multiple Diagnosis Code(s):        --- Professional ---                           I85.00, Esophageal varices without bleeding                           K22.89, Other specified disease of esophagus                           K44.9, Diaphragmatic hernia without obstruction or                            gangrene                           K76.6, Portal hypertension                           K31.89, Other diseases of stomach and duodenum                           D62, Acute posthemorrhagic anemia                           K92.1, Melena (includes Hematochezia)                           R11.2, Nausea with vomiting, unspecified CPT copyright 2022 American [employer] Association. All rights reserved. The codes documented in this report are preliminary and upon coder review may  be revised to meet current compliance requirements. Aloha Finner, MD 08/03/2023 3:25:25 PM Number of Addenda: 0

## 2023-08-03 NOTE — Op Note (Signed)
 [facility] Patient Name: Dmitry Macomber Procedure Date: 08/03/2023 MRN: [id number] Attending MD: Aloha Finner , MD, [id number] Date of Birth: 19-Oct-1960 CSN: [id number] Age: 63 Admit Type: Inpatient Procedure:                Colonoscopy Indications:              Acute post hemorrhagic anemia Providers:                Aloha Finner, MD, Randall Lines, RN, Lorrayne Kitty, Technician Referring MD:              Medicines:                Monitored Anesthesia Care Complications:            No immediate complications. Estimated Blood Loss:     Estimated blood loss was minimal. Procedure:                Pre-Anesthesia Assessment:                           - Prior to the procedure, a History and Physical                            was performed, and patient medications and                            allergies were reviewed. The patient's tolerance of                            previous anesthesia was also reviewed. The risks                            and benefits of the procedure and the sedation                            options and risks were discussed with the patient.                            All questions were answered, and informed consent                            was obtained. Prior Anticoagulants: The patient has                            taken no anticoagulant or antiplatelet agents. ASA                            Grade Assessment: III - A patient with severe                            systemic disease. After reviewing the risks and  benefits, the patient was deemed in satisfactory                            condition to undergo the procedure.                           After obtaining informed consent, the colonoscope                            was passed under direct vision. Throughout the                            procedure, the patient's blood pressure, pulse, and                            oxygen  saturations were monitored continuously. The                            [id number] ([id number]) Olympus colonoscope was                            introduced through the anus and advanced to the the                            cecum, identified by appendiceal orifice and                            ileocecal valve. The colonoscopy was somewhat                            difficult due to significant looping. Successful                            completion of the procedure was aided by changing                            the patient's position, using manual pressure,                            straightening and shortening the scope to obtain                            bowel loop reduction and using scope torsion. The                            patient tolerated the procedure. The quality of the                            bowel preparation was adequate. The ileocecal                            valve, appendiceal orifice, and rectum were  photographed. Scope In: 2:32:32 PM Scope Out: 2:58:16 PM Scope Withdrawal Time: 0 hours 20 minutes 40 seconds  Total Procedure Duration: 0 hours 25 minutes 44 seconds  Findings:      The digital rectal exam findings include hemorrhoids. Pertinent       negatives include no palpable rectal lesions.      The colon (entire examined portion) revealed grossly excessive looping       with redundancy.      Hematin (altered blood/coffee-ground-like material) was found in the       entire colon. This was lavaged copiously.      A single small angioectasia versus spot with bleeding was found in the       proximal ascending colon. There was very mild oozing at the site. Noted       fulguration to ablate the lesion by argon plasma was successful.      Multiple sessile polyps were found in the recto-sigmoid colon, sigmoid       colon and descending colon. The polyps were 1 to 5 mm in size.       Polypectomy was not attempted.      Small,  non-bleeding rectal varix was found.      Normal mucosa was found in the entire colon. I could not visualize signs       of significant diverticular disease on today's examination.      Non-bleeding non-thrombosed external and internal hemorrhoids were found       during retroflexion, during perianal exam and during digital exam. The       hemorrhoids were Grade II (internal hemorrhoids that prolapse but reduce       spontaneously). Impression:               - Hemorrhoids found on digital rectal exam.                           - There was significant looping of the colon with                            redundancy.                           - Hematin in the entire examined colon. Lavaged                            copiously.                           - A single mildly oozing colonic spot versus AVM.                            Treated with argon plasma coagulation (APC).                           - Multiple 1 to 5 mm polyps at the recto-sigmoid                            colon, in the sigmoid colon and in the descending  colon. Resection not attempted.                           - Rectal varix is small.                           - Normal mucosa in the entire examined colon. Could                            not visualize any evidence of diverticulosis.                           - Non-bleeding non-thrombosed external and internal                            hemorrhoids.                           - The entire colon was run 3 times without any                            evidence of further active bleeding being found                            after ablation of the spot/AVM. Moderate Sedation:      Not Applicable - Patient had care per Anesthesia. Recommendation:           - The patient will be observed post-procedure,                            until all discharge criteria are met.                           - Return patient to hospital ward for ongoing care.                            - Full liquid diet.                           - Etiology of patient's GI bleeding is not clearly                            defined with today's upper and lower endoscopy.                            Certainly the small angiectasia of the right colon                            could be a potential source but expected more than                            what was found. I believe the small bowel needs to  be interrogated to ensure no evidence of another                            source of bleeding. The rectal varix is not a                            source of hematochezia in this case.                           - Offer video capsule endoscopy tomorrow.                           - Will consider flexible sigmoidoscopy in future                            when active GI bleeding is not an issue for                            consideration of removal of the polyps (though they                            had more of a hyperplastic appearance).                           - The findings and recommendations were discussed                            with the patient.                           - The findings and recommendations were discussed                            with the referring physician. Procedure Code(s):        --- Professional ---                           [id number], Colonoscopy, flexible; with control of                            bleeding, any method Diagnosis Code(s):        --- Professional ---                           K64.1, Second degree hemorrhoids                           K92.2, Gastrointestinal hemorrhage, unspecified                           K55.21, Angiodysplasia of colon with hemorrhage                           D12.7, Benign neoplasm of rectosigmoid junction  D12.5, Benign neoplasm of sigmoid colon                           D12.4, Benign neoplasm of descending colon                           D62, Acute  posthemorrhagic anemia CPT copyright 2022 American Medical Association. All rights reserved. The codes documented in this report are preliminary and upon coder review may  be revised to meet current compliance requirements. Aloha Finner, MD 08/03/2023 3:33:57 PM Number of Addenda: 0

## 2023-08-04 ENCOUNTER — Encounter (HOSPITAL_COMMUNITY)
Admission: EM | Disposition: A | Discharge status: Home Health | Payer: Self-pay | Source: Home / Self Care | Attending: Internal Medicine

## 2023-08-04 DIAGNOSIS — K922 Gastrointestinal hemorrhage, unspecified: Secondary | ICD-10-CM | POA: Diagnosis not present

## 2023-08-04 LAB — CBC WITH DIFFERENTIAL/PLATELET
Abs Immature Granulocytes: 0.01 K/uL (ref 0.00–0.07)
Basophils Absolute: 0.1 K/uL (ref 0.0–0.1)
Basophils Relative: 1 %
Eosinophils Absolute: 0.2 K/uL (ref 0.0–0.5)
Eosinophils Relative: 5 %
HCT: 26.1 % — ABNORMAL LOW (ref 39.0–52.0)
Hemoglobin: 8.5 g/dL — ABNORMAL LOW (ref 13.0–17.0)
Immature Granulocytes: 0 %
Lymphocytes Relative: 33 %
Lymphs Abs: 1.4 K/uL (ref 0.7–4.0)
MCH: 29.1 pg (ref 26.0–34.0)
MCHC: 32.6 g/dL (ref 30.0–36.0)
MCV: 89.4 fL (ref 80.0–100.0)
Monocytes Absolute: 0.5 K/uL (ref 0.1–1.0)
Monocytes Relative: 12 %
Neutro Abs: 2.1 K/uL (ref 1.7–7.7)
Neutrophils Relative %: 49 %
Platelets: 56 K/uL — ABNORMAL LOW (ref 150–400)
RBC: 2.92 MIL/uL — ABNORMAL LOW (ref 4.22–5.81)
RDW: 16.2 % — ABNORMAL HIGH (ref 11.5–15.5)
WBC: 4.3 K/uL (ref 4.0–10.5)
nRBC: 0 % (ref 0.0–0.2)

## 2023-08-04 LAB — COMPREHENSIVE METABOLIC PANEL WITH GFR
ALT: 26 U/L (ref 0–44)
AST: 46 U/L — ABNORMAL HIGH (ref 15–41)
Albumin: 2.8 g/dL — ABNORMAL LOW (ref 3.5–5.0)
Alkaline Phosphatase: 84 U/L (ref 38–126)
Anion gap: 5 (ref 5–15)
BUN: 8 mg/dL (ref 8–23)
CO2: 23 mmol/L (ref 22–32)
Calcium: 8.5 mg/dL — ABNORMAL LOW (ref 8.9–10.3)
Chloride: 110 mmol/L (ref 98–111)
Creatinine, Ser: 0.88 mg/dL (ref 0.61–1.24)
GFR, Estimated: >60 mL/min
Glucose, Bld: 114 mg/dL — ABNORMAL HIGH (ref 70–99)
Potassium: 3.6 mmol/L (ref 3.5–5.1)
Sodium: 138 mmol/L (ref 135–145)
Total Bilirubin: 1.8 mg/dL — ABNORMAL HIGH (ref 0.0–1.2)
Total Protein: 5.6 g/dL — ABNORMAL LOW (ref 6.5–8.1)

## 2023-08-04 LAB — AMMONIA: Ammonia: 58 umol/L — ABNORMAL HIGH (ref 9–35)

## 2023-08-04 SURGERY — IMAGING PROCEDURE, GI TRACT, INTRALUMINAL, VIA CAPSULE
Anesthesia: LOCAL

## 2023-08-04 MED ORDER — LACTULOSE 10 GM/15ML PO SOLN
20.0000 g | Freq: Two times a day (BID) | ORAL | Status: DC
  Administered 2023-08-04 – 2023-08-05 (×3): 20 g via ORAL
  Filled 2023-08-04 (×3): qty 30

## 2023-08-04 NOTE — Progress Notes (Signed)
 Gastroenterology Inpatient Follow-up Note   PATIENT IDENTIFICATION  Robert Greene is a 63 y.o. male Hospital Day: 4  SUBJECTIVE  The patient's chart has been reviewed. The patient's labs have been reviewed.  Hemoglobin is slightly down trended from yesterday.  Otherwise labs stable. Today, the patient is doing and feeling well.  He is less shaky today. Patient denies abdominal pain or discomfort. He denies any chest pain after esophageal variceal banding yesterday. He denies any evidence of persisting GI bleeding per rectum. He is awaiting potential video capsule endoscopy today   OBJECTIVE   Scheduled Inpatient Medications:   atorvastatin   80 mg Oral Daily   chlordiazePOXIDE   25 mg Oral QID   folic acid   1 mg Oral Daily   hydroxychloroquine   200 mg Oral Daily   lactulose   20 g Oral BID   multivitamin with minerals  1 tablet Oral Daily   pantoprazole   40 mg Oral BID   sucralfate   1 g Oral BID   thiamine   100 mg Oral Daily   Or   thiamine   100 mg Intravenous Daily   Continuous Inpatient Infusions:   cefTRIAXone  (ROCEPHIN )  IV Stopped (08/03/23 1849)   PRN Inpatient Medications: acetaminophen  **OR** acetaminophen , ipratropium-albuterol , LORazepam  **OR** LORazepam , ondansetron  **OR** ondansetron  (ZOFRAN ) IV, senna-docusate   Physical Examination   Temp:  [97.5 F (36.4 C)-98.7 F (37.1 C)] 98.7 F (37.1 C) (07/12 0255) Pulse Rate:  [58-96] 64 (07/12 0255) Resp:  [14-23] 19 (07/12 0255) BP: (97-132)/(30-92) 129/75 (07/12 0255) SpO2:  [95 %-100 %] 97 % (07/12 0255) Weight:  [88.5 kg] 88.5 kg (07/11 1252) Temp (24hrs), Avg:98 F (36.7 C), Min:97.5 F (36.4 C), Max:98.7 F (37.1 C)  Weight: 88.5 kg GEN: Appears chronically ill but is in no acute distress, appears older than stated age PSYCH: Cooperative, without pressured speech EYE: Icteric sclerae ENT: MMM CV: Nontachycardic RESP: No audible wheezing GI: NABS, soft, protuberant abdomen, rounded, distended,  no rebound or guarding  MSK/EXT: No significant lower extremity edema SKIN: Spider angiomata noted NEURO:  Alert & Oriented x 3, no focal deficits, no evidence of asterixis   Review of Data   Laboratory Studies   Recent Labs  Lab 08/04/23 0423  NA 138  K 3.6  CL 110  CO2 23  BUN 8  CREATININE 0.88  GLUCOSE 114*  CALCIUM  8.5*   Recent Labs  Lab 08/04/23 0423  AST 46*  ALT 26  ALKPHOS 84    Recent Labs  Lab 08/02/23 0407 08/02/23 1248 08/03/23 0406 08/03/23 0948 08/04/23 0423  WBC 5.1  --  5.6  --  4.3  HGB 9.3*   < > 9.2*   < > 8.5*  HCT 27.5*   < > 28.2*   < > 26.1*  PLT 61*  --  57*  --  56*   < > = values in this interval not displayed.   Recent Labs  Lab 08/01/23 1857 08/02/23 1248  INR 1.2 1.3*   MELD 3.0: 12 at 08/04/2023  4:23 AM MELD-Na: 12 at 08/04/2023  4:23 AM Calculated from: Serum Creatinine: 0.88 mg/dL (Using min of 1 mg/dL) at 2/87/7974  5:76 AM Serum Sodium: 138 mmol/L (Using max of 137 mmol/L) at 08/04/2023  4:23 AM Total Bilirubin: 1.8 mg/dL at 2/87/7974  5:76 AM Serum Albumin : 2.8 g/dL at 2/87/7974  5:76 AM INR(ratio): 1.3 at 08/02/2023 12:48 PM Age at listing (hypothetical): 24 years Sex: Male at 08/04/2023  4:23 AM   Imaging Studies  US  ASCITES (ABDOMEN LIMITED) Result Date: 08/02/2023 CLINICAL DATA:  Patient with history of alcoholic cirrhosis, esophageal varices, portal hypertension, thrombocytopenia, reported ascites. Evaluate for paracentesis. EXAM: LIMITED ABDOMEN ULTRASOUND FOR ASCITES COMPARISON:  Ultrasound abdomen limited right upper quadrant 08/02/23 FINDINGS: All 4 quadrants of abdomen scanned for ascites. Small amount of ascites noted in perihepatic region. No safe window available for paracentesis. IMPRESSION: Small amount of ascites present, primarily perihepatic. No safe window for paracentesis at this time. Procedure not performed. Performed by: Franky Kelsie RIGGERS Electronically Signed   By: Juliene Balder M.D.   On: 08/02/2023  18:15   GI Procedures and Studies  EGD - No gross lesions in the proximal esophagus and in the mid esophagus. - Grade I, grade II and grade III esophageal varices with no bleeding and evidence of red wale signs on grade 3 varices. Banded. Completely eradicated. - Z-line irregular, 41 cm from the incisors. - 1 cm hiatal hernia. - Portal hypertensive gastropathy. - Erythematous mucosa in the stomach. Biopsied. - Subepithelial nodule found in duodenum to. - No other gross lesions in the duodenal bulb, in the first portion of the duodenum and in the second portion of the duodenum.  Colonoscopy - Hemorrhoids found on digital rectal exam. - There was significant looping of the colon with redundancy. - Hematin in the entire examined colon. Lavaged copiously. - A single mildly oozing colonic spot versus AVM. Treated with argon plasma coagulation (APC). - Multiple 1 to 5 mm polyps at the recto-sigmoid colon, in the sigmoid colon and in the descending colon. Resection not attempted. - Rectal varix is small. - Normal mucosa in the entire examined colon. Could not visualize any evidence of diverticulosis. - Non-bleeding non-thrombosed external and internal hemorrhoids. - The entire colon was run 3 times without any evidence of further active bleeding being found after ablation of the spot/AVM.   ASSESSMENT  Robert Greene is a 63 y.o. male with a pmh significant for rheumatoid arthritis, CAD, hypertension, hyperlipidemia, PIF/interstitial lung disease, nephrolithiasis, alcoholic cirrhosis (complicated by portal hypertension manifested as esophageal varices and perihepatic ascites), continued alcohol use disorder.  Patient admitted to hospital with acute blood loss anemia and bright red blood per rectum.  The patient is hemodynamically and clinically stable today.  Etiology of his hematochezia is not clearly defined from yesterday's colonoscopy so I initially was considering a possible  video capsule endoscopy.  With  that being said I have concerns in the setting of him having been banded for his grade 3 and grade 2 varices yesterday that could cause more problems if the video capsule did not pass or if he actually took rubber bands off of the varices.  As such we are going to hold off on video capsule endoscopy for now since his hemoglobin is stable and he has not had further bleeding.  I do think that if things stabilize and he has no further issues, we can do an outpatient video capsule endoscopy.  Time will tell.  Will follow-up to see how he is doing tomorrow and allow him to have a normal diet.  He is in agreement with this plan.  All patient questions were answered to the best of my ability, and the patient agrees to the aforementioned plan of action with follow-up as indicated.   PLAN/RECOMMENDATIONS  Continue antibiotics for 5-day course total since admission for infection prophylaxis in setting of underlying cirrhosis (even though has no ascites and was not variceal in origin) Continue MVI/thiamine /folate Elevation in ammonia level  and his overall status suggest that he may benefit from lactulose  which I agree and has been added by medicine service - Titrate lactulose  to 3-4 bowel movements per day Plan to repeat endoscopy in 3 to 5 weeks (my team will communicate with patient next week) When stable please restart nadolol  20 mg daily hopefully later today versus tomorrow if blood pressures and heart rates allow We will cancel today's video capsule endoscopy that was scheduled - Can consider VCE as outpatient if issues of anemia persist Advance diet as tolerated   Please page/call with questions or concerns.   Aloha Finner, MD [facility] Office # [phone]    LOS: 3 days  Aloha Finner Raddle  08/04/2023, 10:05 AM

## 2023-08-04 NOTE — Plan of Care (Signed)
   Problem: Coping: Goal: Level of anxiety will decrease Outcome: Progressing   Problem: Safety: Goal: Ability to remain free from injury will improve Outcome: Progressing   Problem: Skin Integrity: Goal: Risk for impaired skin integrity will decrease Outcome: Progressing

## 2023-08-04 NOTE — Progress Notes (Signed)
 PROGRESS NOTE  [name]  DOB: 1960/05/29  PCP: [name], MD FMW:[id number]  DOA: 08/01/2023  LOS: 3 days  Hospital Day: 4  Brief narrative: Robert Greene is a 63 y.o. male with PMH significant for chronic alcohol use, chronic smoking, alcoholic liver cirrhosis with esophageal varices, ILD/IPF, rheumatoid arthritis, HTN, HLD, CAD, anxiety 7/10, patient was brought to the ED by EMS from home with complaint of multiple episodes of rectal bleeding for about 24 hours.  Also reported associated lightheadedness leading to a syncopal episode, fell forward, hit his face then got a black eye.  In the ED, patient was afebrile, blood pressure in 160s, breathing room air Initial labs showed WBC count 6.8, hemoglobin 11.1, platelet 83, renal function normal, INR 1.2 CT head without acute abnormalities EDP discussed with GI Patient was started on IV Protonix , IV octreotide , IV Rocephin  Admitted to [facility]  7/11, EGD showed Grade I, grade II and grade III esophageal varices with no bleeding and evidence of red wale signs on grade 3 varices. Banded. Completely eradicated.  Also had portal hypertensive gastropathy. 7/11, colonoscopy showed - Hematin in the entire examined colon. Lavaged copiously. - A single mildly oozing colonic spot versus AVM s/p APC - Multiple 1 to 5 mm polyps at the recto-sigmoid colon, in the sigmoid colon and in the descending colon. Resection not attempted. -Small rectal varix - Non-bleeding non-thrombosed external and internal hemorrhoids.                Subjective: Patient was seen and examined this morning.  Propped up in bed.  Not in distress.  Alert, awake, oriented x 3.  Very minimal tremors today.  Daughter at bedside.  GI follow-up this morning appreciated.  Patient reports he had one small bowel movement with some blood related this morning.   Assessment and plan: Acute GI bleeding Acute blood loss anemia Presented with BRBPR, low hemoglobin in the  setting of known liver cirrhosis Initially started on IV Protonix , octreotide  infusion 7/11, underwent EGD and colonoscopy -findings as above Hemoglobin slightly dropped this morning. Per GI, etiology of patient's bleeding is not clearly defined with EGD and colonoscopy.  The small AVM in the right colon probably is not enough of a cause.  May require VCE tomorrow. Also needs proctosigmoidoscopy in the future for removal of polyps as they were not removed in the setting of already existing GI bleeding. Hemoglobin trend as below.  Stable above 9 for 2 days Continue to monitor.  Plan to transfuse if less than 7 Soft diet Recent Labs    08/02/23 0407 08/02/23 1248 08/03/23 0406 08/03/23 0948 08/04/23 0423  HGB 9.3* 9.2* 9.2* 9.2* 8.5*  MCV 85.9  --  88.7  --  89.4  VITAMINB12 952*  --   --   --   --   FERRITIN 55  --   --   --   --   TIBC 314  --   --   --   --   IRON 151  --   --   --   --    Chronic alcoholism  Alcohol withdrawal symptoms Hepatic encephalopathy 7/11, patient had alcohol-related tremors and confusion. Probably he was drinking more than what he reported.  Confusion could be due to hepatic encephalopathy as well as ammonia level was elevated Started on CIWA protocol with scheduled Librium  and as needed Ativan . Continue the same.  May need to renew Librium  dose tomorrow. Counseled to quit alcohol Started on  lactulose  20 mg twice daily to flush out ammonia.  Target bowel movement 3-4 day.  Alcoholic liver cirrhosis H/o esophageal varices, portal hypertensive gastropathy, thrombocytopenia RUQ ultrasound showed cirrhotic liver with hepatic steatosis and moderate volume ascites. Ultrasound RUQ showed small amount of ascites, primarily perihepatic and not safe window for paracentesis. Currently on IV Rocephin  for SBP prophylaxis Platelet count low but stable. Recent Labs  Lab 08/01/23 1857 08/02/23 0407 08/02/23 1248 08/03/23 0406 08/04/23 0423  AST 63* 50*  --   54* 46*  ALT 34 28  --  30 26  ALKPHOS 106 85  --  82 84  BILITOT 2.8* 2.2*  --  2.3* 1.8*  PROT 6.8 5.9*  --  5.7* 5.6*  ALBUMIN  3.4* 3.0*  --  2.9* 2.8*  AMMONIA  --   --   --  39* 58*  INR 1.2  --  1.3*  --   --   PLT 83* 61*  --  57* 56*   HTN BP initially elevated with SBP in the 160s to 170s Currently on amlodipine  and losartan  Both of them are currently on hold.  If blood pressure allows, we will plan portal hypertension regimen with beta-blocker, Lasix , Aldactone  Hyponatremia Sodium mildly low.  Trend as below in the setting of hypervolemia from portal hypertension. Improved to normal today. Recent Labs  Lab 08/01/23 1857 08/02/23 0407 08/03/23 0406 08/04/23 0423  NA 135 131* 134* 138    Pulmonary fibrosis/ILD Emphysema Chronic daily smoker Endorse mild dyspnea on exertion Reports he has been referred to the ILD clinic with appointment in September As needed DuoNebs Counseled to quit smoking   Rheumatoid arthritis Polymyalgia rheumatica Follows with [facility] rheumatology PA Continue hydroxychloroquine  200 mg daily Continue weekly Enbrel injections since   HLD Continue atorvastatin    Osteoarthritis As needed Tylenol    Mobility: Encourage ambulation.  PT recommended home with PT  Goals of care   Code Status: Full Code     DVT prophylaxis:  SCDs Start: 08/01/23 2056   Antimicrobials: IV Rocephin  prophylactic Fluid: None Consultants: GI Family Communication: Daughter at bedside  Status: Inpatient Level of care:  Telemetry   Patient is from: Home Needs to continue in-hospital care: May require VCE tomorrow Anticipated d/c to: Hopefully home in 2 to 3 days   Diet:  Diet Order             Diet 2 gram sodium Room service appropriate? Yes; Fluid consistency: Thin  Diet effective now                   Scheduled Meds:  atorvastatin   80 mg Oral Daily   chlordiazePOXIDE   25 mg Oral QID   folic acid   1 mg Oral Daily    hydroxychloroquine   200 mg Oral Daily   lactulose   20 g Oral BID   multivitamin with minerals  1 tablet Oral Daily   pantoprazole   40 mg Oral BID   sucralfate   1 g Oral BID   thiamine   100 mg Oral Daily   Or   thiamine   100 mg Intravenous Daily    PRN meds: acetaminophen  **OR** acetaminophen , ipratropium-albuterol , LORazepam  **OR** LORazepam , ondansetron  **OR** ondansetron  (ZOFRAN ) IV, senna-docusate   Infusions:   cefTRIAXone  (ROCEPHIN )  IV Stopped (08/03/23 1849)    Antimicrobials: Anti-infectives (From admission, onward)    Start     Dose/Rate Route Frequency Ordered Stop   08/02/23 1830  cefTRIAXone  (ROCEPHIN ) 2 g in sodium chloride  0.9 % 100 mL  IVPB        2 g 200 mL/hr over 30 Minutes Intravenous Every 24 hours 08/02/23 0315     08/02/23 1515  hydroxychloroquine  (PLAQUENIL ) tablet 200 mg        200 mg Oral Daily 08/02/23 1424     08/01/23 1830  cefTRIAXone  (ROCEPHIN ) 2 g in sodium chloride  0.9 % 100 mL IVPB        2 g 200 mL/hr over 30 Minutes Intravenous  Once 08/01/23 1819 08/01/23 2032       Objective: Vitals:   08/03/23 2153 08/04/23 0255  BP: 111/63 129/75  Pulse: 68 64  Resp: 17 19  Temp: 98.2 F (36.8 C) 98.7 F (37.1 C)  SpO2: 99% 97%    Intake/Output Summary (Last 24 hours) at 08/04/2023 1149 Last data filed at 08/03/2023 1915 Gross per 24 hour  Intake 1579.32 ml  Output 600 ml  Net 979.32 ml   Filed Weights   08/01/23 2248 08/03/23 1252  Weight: 88.5 kg 88.5 kg   Weight change:  Body mass index is 24.39 kg/m.   Physical Exam this morning: General exam: Pleasant, middle-aged not in distress Skin: No rashes, lesions or ulcers. HEENT: Atraumatic, normocephalic, no obvious bleeding Lungs: Clear to auscultation bilaterally,  CVS: S1, S2, no murmur,   GI/Abd: Soft, nontender, nondistended, bowel sound present,   CNS: Alert, awake, oriented x 3.  Very minimal tremors today  psychiatry: Sad affect Extremities: Trace bilateral pedal edema, no  calf tenderness,   Data Review: I have personally reviewed the laboratory data and studies available.  F/u labs ordered Unresulted Labs (From admission, onward)    None       Admission date and time: 08/01/2023  4:49 PM    Signed, Chapman Rota, MD [facility] 08/04/2023

## 2023-08-04 NOTE — Progress Notes (Signed)
 Mobility Specialist - Progress Note   08/04/23 1201  Mobility  Activity Ambulated with assistance in hallway  Level of Assistance Standby assist, set-up cues, supervision of patient - no hands on  Assistive Device Front wheel walker  Distance Ambulated (ft) 250 ft  Range of Motion/Exercises Active  Activity Response Tolerated well  Mobility Referral Yes  Mobility visit 1 Mobility  Mobility Specialist Start Time (ACUTE ONLY) 1150  Mobility Specialist Stop Time (ACUTE ONLY) 1201  Mobility Specialist Time Calculation (min) (ACUTE ONLY) 11 min   Pt was found in bed and agreeable to ambulate. C/o arthritis during session. At EOS returned to bed with all needs met. Call bell in reach.  Erminio Leos,  Mobility Specialist Can be reached via Secure Chat

## 2023-08-05 DIAGNOSIS — K922 Gastrointestinal hemorrhage, unspecified: Secondary | ICD-10-CM | POA: Diagnosis not present

## 2023-08-05 LAB — CBC
HCT: 28.7 % — ABNORMAL LOW (ref 39.0–52.0)
Hemoglobin: 9.1 g/dL — ABNORMAL LOW (ref 13.0–17.0)
MCH: 29.4 pg (ref 26.0–34.0)
MCHC: 31.7 g/dL (ref 30.0–36.0)
MCV: 92.6 fL (ref 80.0–100.0)
Platelets: 69 K/uL — ABNORMAL LOW (ref 150–400)
RBC: 3.1 MIL/uL — ABNORMAL LOW (ref 4.22–5.81)
RDW: 16.6 % — ABNORMAL HIGH (ref 11.5–15.5)
WBC: 6.2 K/uL (ref 4.0–10.5)
nRBC: 0 % (ref 0.0–0.2)

## 2023-08-05 MED ORDER — NADOLOL 20 MG PO TABS
20.0000 mg | ORAL_TABLET | Freq: Every day | ORAL | Status: DC
  Administered 2023-08-05 – 2023-08-06 (×2): 20 mg via ORAL
  Filled 2023-08-05 (×2): qty 1

## 2023-08-05 MED ORDER — LACTULOSE 10 GM/15ML PO SOLN
20.0000 g | Freq: Every day | ORAL | Status: DC
  Administered 2023-08-06: 20 g via ORAL
  Filled 2023-08-05: qty 30

## 2023-08-05 NOTE — Progress Notes (Signed)
 PROGRESS NOTE  OBERON HEHIR  DOB: March 24, 1960  PCP: [name], MD FMW:[id number]  DOA: 08/01/2023  LOS: 4 days  Hospital Day: 5  Brief narrative: Robert Greene is a 63 y.o. male with PMH significant for chronic alcohol use, chronic smoking, alcoholic liver cirrhosis with esophageal varices, ILD/IPF, rheumatoid arthritis, HTN, HLD, CAD, anxiety 7/10, patient was brought to the ED by EMS from home with complaint of multiple episodes of rectal bleeding for about 24 hours.  Also reported associated lightheadedness leading to a syncopal episode, fell forward, hit his face then got a black eye.  In the ED, patient was afebrile, blood pressure in 160s, breathing room air Initial labs showed WBC count 6.8, hemoglobin 11.1, platelet 83, renal function normal, INR 1.2 CT head without acute abnormalities EDP discussed with GI Patient was started on IV Protonix , IV octreotide , IV Rocephin  Admitted to [facility]  7/11, EGD showed Grade I, grade II and grade III esophageal varices with no bleeding and evidence of red wale signs on grade 3 varices. Banded. Completely eradicated.  Also had portal hypertensive gastropathy. 7/11, colonoscopy showed - Hematin in the entire examined colon. Lavaged copiously. - A single mildly oozing colonic spot versus AVM s/p APC - Multiple 1 to 5 mm polyps at the recto-sigmoid colon, in the sigmoid colon and in the descending colon. Resection not attempted. -Small rectal varix - Non-bleeding non-thrombosed external and internal hemorrhoids.                Subjective: Patient was seen and examined this morning.  Lying on bed.  Not in distress.  Mental status intact. Says he had few bowel movements yesterday with blood but this morning he already had 3 bowel movements without any blood.  Hemoglobin stable. GI follow-up from this morning appreciated.  Assessment and plan: Acute GI bleeding Acute blood loss anemia Presented with BRBPR, low hemoglobin in the setting  of known liver cirrhosis Initially started on IV Protonix , octreotide  infusion 7/11, underwent EGD and colonoscopy -findings as above Hemoglobin slightly dropped this morning. Per GI, etiology of patient's bleeding is not clearly defined with EGD and colonoscopy.  The small AVM in the right colon probably is not enough of a cause. VCE was thought to be necessary.  However, patient seems to have stopped bleeding, hemoglobin stable. Per GI, patient may be stable enough for VCE in next 2 to 3 weeks and repeat endoscopy in 3 to 5 weeks. If remains stable, plan to discharge home tomorrow. Able to tolerate soft diet Recent Labs    08/02/23 0407 08/02/23 1248 08/03/23 0406 08/03/23 0948 08/04/23 0423 08/05/23 0911  HGB 9.3* 9.2* 9.2* 9.2* 8.5* 9.1*  MCV 85.9  --  88.7  --  89.4 92.6  VITAMINB12 952*  --   --   --   --   --   FERRITIN 55  --   --   --   --   --   TIBC 314  --   --   --   --   --   IRON 151  --   --   --   --   --    Chronic alcoholism  Alcohol withdrawal symptoms Hepatic encephalopathy 7/11, patient had alcohol-related tremors and confusion. Probably he was drinking more than what he reported.  Confusion could be due to hepatic encephalopathy as well as ammonia level was elevated Started on CIWA protocol with scheduled Librium  and as needed Ativan . Continue as needed Ativan  today  Counseled to quit alcohol Started on lactulose  20 mg twice daily to flush out ammonia.  Target bowel movement 3-4 day.  Alcoholic liver cirrhosis H/o esophageal varices, portal hypertensive gastropathy, thrombocytopenia RUQ ultrasound showed cirrhotic liver with hepatic steatosis and moderate volume ascites. Ultrasound RUQ showed small amount of ascites, primarily perihepatic and not safe window for paracentesis. Currently on IV Rocephin  for SBP prophylaxis.  To complete 5-day course with IV or oral Platelet count low but stable. Recent Labs  Lab 08/01/23 1857 08/02/23 0407 08/02/23 1248  08/03/23 0406 08/04/23 0423 08/05/23 0911  AST 63* 50*  --  54* 46*  --   ALT 34 28  --  30 26  --   ALKPHOS 106 85  --  82 84  --   BILITOT 2.8* 2.2*  --  2.3* 1.8*  --   PROT 6.8 5.9*  --  5.7* 5.6*  --   ALBUMIN  3.4* 3.0*  --  2.9* 2.8*  --   AMMONIA  --   --   --  39* 58*  --   INR 1.2  --  1.3*  --   --   --   PLT 83* 61*  --  57* 56* 69*   HTN BP initially elevated with SBP in the 160s to 170s Currently on amlodipine  and losartan  to create room for portal hypertension regimen.  Nadolol  20 mg daily has been started.  If blood pressure allows, we will plan to start diuretics at discharge.    Hyponatremia Sodium mildly low.  Trend as below in the setting of hypervolemia from portal hypertension. Improved to normal today. Recent Labs  Lab 08/01/23 1857 08/02/23 0407 08/03/23 0406 08/04/23 0423  NA 135 131* 134* 138    Pulmonary fibrosis/ILD Emphysema Chronic daily smoker Endorse mild dyspnea on exertion Reports he has been referred to the ILD clinic with appointment in September As needed DuoNebs Counseled to quit smoking   Rheumatoid arthritis Polymyalgia rheumatica Follows with [facility] rheumatology PA Continue hydroxychloroquine  200 mg daily Continue weekly Enbrel injections since   HLD Continue atorvastatin    Osteoarthritis As needed Tylenol    Mobility: Encourage ambulation.  PT recommended home with PT  Goals of care   Code Status: Full Code     DVT prophylaxis:  SCDs Start: 08/01/23 2056   Antimicrobials: IV Rocephin  prophylactic Fluid: None Consultants: GI Family Communication: Daughter not at bedside today  Status: Inpatient Level of care:  Telemetry   Patient is from: Home Needs to continue in-hospital care: Continue monitor hemoglobin Anticipated d/c to: Hopefully home tomorrow   Diet:  Diet Order             Diet 2 gram sodium Room service appropriate? Yes; Fluid consistency: Thin  Diet effective now                    Scheduled Meds:  atorvastatin   80 mg Oral Daily   folic acid   1 mg Oral Daily   hydroxychloroquine   200 mg Oral Daily   [START ON 08/06/2023] lactulose   20 g Oral Daily   multivitamin with minerals  1 tablet Oral Daily   nadolol   20 mg Oral Daily   pantoprazole   40 mg Oral BID   sucralfate   1 g Oral BID   thiamine   100 mg Oral Daily   Or   thiamine   100 mg Intravenous Daily    PRN meds: acetaminophen  **OR** acetaminophen , ipratropium-albuterol , LORazepam  **OR** LORazepam , ondansetron  **OR** ondansetron  (ZOFRAN ) IV,  senna-docusate   Infusions:   cefTRIAXone  (ROCEPHIN )  IV Stopped (08/04/23 1816)    Antimicrobials: Anti-infectives (From admission, onward)    Start     Dose/Rate Route Frequency Ordered Stop   08/02/23 1830  cefTRIAXone  (ROCEPHIN ) 2 g in sodium chloride  0.9 % 100 mL IVPB        2 g 200 mL/hr over 30 Minutes Intravenous Every 24 hours 08/02/23 0315     08/02/23 1515  hydroxychloroquine  (PLAQUENIL ) tablet 200 mg        200 mg Oral Daily 08/02/23 1424     08/01/23 1830  cefTRIAXone  (ROCEPHIN ) 2 g in sodium chloride  0.9 % 100 mL IVPB        2 g 200 mL/hr over 30 Minutes Intravenous  Once 08/01/23 1819 08/01/23 2032       Objective: Vitals:   08/04/23 2031 08/05/23 0423  BP: 126/84 129/74  Pulse: 67 (!) 58  Resp: 19 18  Temp: 98.3 F (36.8 C) 98.2 F (36.8 C)  SpO2: 100% 100%    Intake/Output Summary (Last 24 hours) at 08/05/2023 1204 Last data filed at 08/05/2023 0800 Gross per 24 hour  Intake 940 ml  Output --  Net 940 ml   Filed Weights   08/01/23 2248 08/03/23 1252  Weight: 88.5 kg 88.5 kg   Weight change:  Body mass index is 24.39 kg/m.   Physical Exam this morning: General exam: Pleasant, middle-aged not in distress. Skin: No rashes, lesions or ulcers. HEENT: Atraumatic, normocephalic, no obvious bleeding Lungs: Clear to auscultation bilaterally,  CVS: S1, S2, no murmur,   GI/Abd: Soft, nontender, nondistended, bowel sound  present,   CNS: Alert, awake, oriented x 3.  Very minimal tremors today  psychiatry: Mood appropriate Extremities: Trace bilateral pedal edema, no calf tenderness,   Data Review: I have personally reviewed the laboratory data and studies available.  F/u labs ordered Unresulted Labs (From admission, onward)     Start     Ordered   Unscheduled  CBC with Differential/Platelet  Tomorrow morning,   R        08/05/23 1204   Unscheduled  Basic metabolic panel with GFR  Tomorrow morning,   R        08/05/23 1204            Signed, Chapman Rota, MD [facility] 08/05/2023

## 2023-08-05 NOTE — Plan of Care (Signed)

## 2023-08-05 NOTE — Progress Notes (Signed)
 Mobility Specialist - Progress Note   08/05/23 1203  Mobility  Activity Ambulated with assistance in hallway  Level of Assistance Standby assist, set-up cues, supervision of patient - no hands on  Assistive Device Front wheel walker  Distance Ambulated (ft) 250 ft  Range of Motion/Exercises Active  Activity Response Tolerated well  Mobility Referral Yes  Mobility visit 1 Mobility  Mobility Specialist Start Time (ACUTE ONLY) 1150  Mobility Specialist Stop Time (ACUTE ONLY) 1203  Mobility Specialist Time Calculation (min) (ACUTE ONLY) 13 min   Pt was found in bed and agreeable to ambulate. No complaints with session. At EOS returned to bed with all needs met. Call bell in reach.  Erminio Leos,  Mobility Specialist Can be reached via Secure Chat

## 2023-08-05 NOTE — Progress Notes (Signed)
 Gastroenterology Inpatient Follow-up Note   PATIENT IDENTIFICATION  Robert Greene is a 63 y.o. male Hospital Day: 5  SUBJECTIVE  The patient's chart has been reviewed. The patient's labs have been reviewed.  Hemoglobin is stable at this time. Today, the patient is feeling well. Patient stated that he had 1 small episode of rectal bleeding yesterday but 2 subsequent bowel movements without blood. No progressive abdominal pain or discomfort. He denies fevers or chills.   He is hopeful to be heading home soon.   OBJECTIVE   Scheduled Inpatient Medications:   atorvastatin   80 mg Oral Daily   folic acid   1 mg Oral Daily   hydroxychloroquine   200 mg Oral Daily   [START ON 08/06/2023] lactulose   20 g Oral Daily   multivitamin with minerals  1 tablet Oral Daily   nadolol   20 mg Oral Daily   pantoprazole   40 mg Oral BID   sucralfate   1 g Oral BID   thiamine   100 mg Oral Daily   Or   thiamine   100 mg Intravenous Daily   Continuous Inpatient Infusions:   cefTRIAXone  (ROCEPHIN )  IV Stopped (08/04/23 1816)   PRN Inpatient Medications: acetaminophen  **OR** acetaminophen , ipratropium-albuterol , LORazepam  **OR** LORazepam , ondansetron  **OR** ondansetron  (ZOFRAN ) IV, senna-docusate   Physical Examination   Temp:  [98 F (36.7 C)-98.3 F (36.8 C)] 98.2 F (36.8 C) (07/13 0423) Pulse Rate:  [58-67] 58 (07/13 0423) Resp:  [16-19] 18 (07/13 0423) BP: (125-129)/(74-84) 129/74 (07/13 0423) SpO2:  [100 %] 100 % (07/13 0423) Temp (24hrs), Avg:98.2 F (36.8 C), Min:98 F (36.7 C), Max:98.3 F (36.8 C)  Weight: 88.5 kg GEN: In no acute distress PSYCH: Cooperative, without pressured speech EYE: Icteric sclerae ENT: MMM CV: Nontachycardic RESP: No audible wheezing GI: NABS, soft, protuberant abdomen, rounded, distended, no rebound or guarding  MSK/EXT: No significant lower extremity edema SKIN: Spider angiomata noted NEURO:  Alert & Oriented x 3, no focal deficits, no evidence  of asterixis   Review of Data   Laboratory Studies   Recent Labs  Lab 08/04/23 0423  NA 138  K 3.6  CL 110  CO2 23  BUN 8  CREATININE 0.88  GLUCOSE 114*  CALCIUM  8.5*   Recent Labs  Lab 08/04/23 0423  AST 46*  ALT 26  ALKPHOS 84    Recent Labs  Lab 08/03/23 0406 08/03/23 0948 08/04/23 0423 08/05/23 0911  WBC 5.6  --  4.3 6.2  HGB 9.2*   < > 8.5* 9.1*  HCT 28.2*   < > 26.1* 28.7*  PLT 57*  --  56* 69*   < > = values in this interval not displayed.   Recent Labs  Lab 08/01/23 1857 08/02/23 1248  INR 1.2 1.3*   MELD 3.0: 12 at 08/04/2023  4:23 AM MELD-Na: 12 at 08/04/2023  4:23 AM Calculated from: Serum Creatinine: 0.88 mg/dL (Using min of 1 mg/dL) at 2/87/7974  5:76 AM Serum Sodium: 138 mmol/L (Using max of 137 mmol/L) at 08/04/2023  4:23 AM Total Bilirubin: 1.8 mg/dL at 2/87/7974  5:76 AM Serum Albumin : 2.8 g/dL at 2/87/7974  5:76 AM INR(ratio): 1.3 at 08/02/2023 12:48 PM Age at listing (hypothetical): 82 years Sex: Male at 08/04/2023  4:23 AM  Imaging Studies   No results found.  GI Procedures and Studies  No new GI procedures to review   ASSESSMENT  Mr. Mcquitty is a 63 y.o. male with a pmh significant for rheumatoid arthritis, CAD, hypertension, hyperlipidemia, PIF/interstitial lung  disease, nephrolithiasis, alcoholic cirrhosis (complicated by portal hypertension manifested as esophageal varices and perihepatic ascites), continued alcohol use disorder.  Patient admitted to hospital with acute blood loss anemia and bright red blood per rectum.  Patient is clinically and hemodynamically stable at this time.  His hemoglobin is trending upwards without significant recurrence of his bright red blood per rectum.  Although the treated area in the right colon could be a possible source for bleeding, I feel that additional evaluation is required of the small bowel.  However with his ligation bands in place I feel uncomfortable about performing video capsule  endoscopy unless overt GI bleeding is occurring with transfusion dependency.  Since he has been stable ongoing to reinitiate his nadolol .  I am going to work on scheduling him a video capsule endoscopy in approximately 2-1/2 to 3 weeks to evaluate his small bowel as an outpatient.  He will be placed for a repeat upper endoscopy in 3 to 5 weeks for variceal banding protocol.  I think he may be able to be discharged later today versus early tomorrow pending his overall clinical status and reinitiation of other medications.  If he is still in-house tomorrow, GI will see him otherwise I will work on setting up the outpatient follow-up.  All patient questions were answered to the best of my ability, and the patient agrees to the aforementioned plan of action with follow-up as indicated.   PLAN/RECOMMENDATIONS  Can course a 5-day course of antibiotics for patient as able from date of admission - Can transition ceftriaxone  to norfloxacin or cefdinir  Continue MVI/thiamine /folate Continue lactulose  20 g daily - Titrate to 3 bowel movements per day Plan for VCE in next 2 to 3 weeks (if possible as outpatient) to evaluate small bowel Plan to repeat endoscopy in 3 to 5 weeks (my team will communicate with patient next week) Restart nadolol  20 mg daily If patient is doing really well he can be discharged later today versus early tomorrow   Dr. Avram will be taking over the [facility] service tomorrow.  Please page/call with questions or concerns.   Aloha Finner, MD [facility] Advanced Endoscopy Office # [phone]    LOS: 4 days  Aloha Finner Raddle  08/05/2023, 11:50 AM

## 2023-08-06 ENCOUNTER — Other Ambulatory Visit: Payer: Self-pay

## 2023-08-06 ENCOUNTER — Other Ambulatory Visit (HOSPITAL_COMMUNITY): Payer: Self-pay

## 2023-08-06 ENCOUNTER — Encounter (HOSPITAL_COMMUNITY): Payer: Self-pay | Admitting: Gastroenterology

## 2023-08-06 ENCOUNTER — Telehealth: Payer: Self-pay

## 2023-08-06 DIAGNOSIS — D509 Iron deficiency anemia, unspecified: Secondary | ICD-10-CM

## 2023-08-06 DIAGNOSIS — K922 Gastrointestinal hemorrhage, unspecified: Secondary | ICD-10-CM | POA: Diagnosis not present

## 2023-08-06 LAB — CBC WITH DIFFERENTIAL/PLATELET
Abs Immature Granulocytes: 0.01 K/uL (ref 0.00–0.07)
Basophils Absolute: 0.1 K/uL (ref 0.0–0.1)
Basophils Relative: 1 %
Eosinophils Absolute: 0.2 K/uL (ref 0.0–0.5)
Eosinophils Relative: 4 %
HCT: 27.7 % — ABNORMAL LOW (ref 39.0–52.0)
Hemoglobin: 9 g/dL — ABNORMAL LOW (ref 13.0–17.0)
Immature Granulocytes: 0 %
Lymphocytes Relative: 31 %
Lymphs Abs: 1.9 K/uL (ref 0.7–4.0)
MCH: 29.3 pg (ref 26.0–34.0)
MCHC: 32.5 g/dL (ref 30.0–36.0)
MCV: 90.2 fL (ref 80.0–100.0)
Monocytes Absolute: 0.7 K/uL (ref 0.1–1.0)
Monocytes Relative: 11 %
Neutro Abs: 3.2 K/uL (ref 1.7–7.7)
Neutrophils Relative %: 53 %
Platelets: 65 K/uL — ABNORMAL LOW (ref 150–400)
RBC: 3.07 MIL/uL — ABNORMAL LOW (ref 4.22–5.81)
RDW: 16.6 % — ABNORMAL HIGH (ref 11.5–15.5)
WBC: 6.1 K/uL (ref 4.0–10.5)
nRBC: 0 % (ref 0.0–0.2)

## 2023-08-06 LAB — BASIC METABOLIC PANEL WITH GFR
Anion gap: 9 (ref 5–15)
BUN: 6 mg/dL — ABNORMAL LOW (ref 8–23)
CO2: 21 mmol/L — ABNORMAL LOW (ref 22–32)
Calcium: 8.3 mg/dL — ABNORMAL LOW (ref 8.9–10.3)
Chloride: 104 mmol/L (ref 98–111)
Creatinine, Ser: 0.76 mg/dL (ref 0.61–1.24)
GFR, Estimated: >60 mL/min (ref >60)
Glucose, Bld: 91 mg/dL (ref 70–99)
Potassium: 3.3 mmol/L — ABNORMAL LOW (ref 3.5–5.1)
Sodium: 134 mmol/L — ABNORMAL LOW (ref 135–145)

## 2023-08-06 MED ORDER — SUCRALFATE 1 GM/10ML PO SUSP: 1.0000 g | Freq: Two times a day (BID) | ORAL | 0 refills | Status: DC

## 2023-08-06 MED ORDER — POTASSIUM CHLORIDE CRYS ER 20 MEQ PO TBCR
40.0000 meq | EXTENDED_RELEASE_TABLET | Freq: Once | ORAL | Status: AC
  Administered 2023-08-06: 40 meq via ORAL
  Filled 2023-08-06: qty 2

## 2023-08-06 MED ORDER — FUROSEMIDE 20 MG PO TABS
20.0000 mg | ORAL_TABLET | Freq: Every day | ORAL | 0 refills | Status: DC | PRN
Start: 2023-08-06 — End: 2023-09-20

## 2023-08-06 MED ORDER — SUCRALFATE 1 GM/10ML PO SUSP
1.0000 g | Freq: Two times a day (BID) | ORAL | 0 refills | Status: DC
  Filled 2023-08-06: qty 280, 14d supply, fill #0

## 2023-08-06 MED ORDER — NADOLOL 20 MG PO TABS
20.0000 mg | ORAL_TABLET | Freq: Every day | ORAL | 0 refills | Status: DC
  Filled 2023-08-06: qty 90, 90d supply, fill #0

## 2023-08-06 MED ORDER — PANTOPRAZOLE SODIUM 40 MG PO TBEC: 40.0000 mg | DELAYED_RELEASE_TABLET | Freq: Two times a day (BID) | ORAL | 2 refills | Status: DC

## 2023-08-06 MED ORDER — NADOLOL 20 MG PO TABS: 20.0000 mg | ORAL_TABLET | Freq: Every day | ORAL | 0 refills | Status: DC

## 2023-08-06 MED ORDER — LACTULOSE 10 GM/15ML PO SOLN: 20.0000 g | Freq: Every day | ORAL | 0 refills | Status: DC | PRN

## 2023-08-06 MED ORDER — LACTULOSE 10 GM/15ML PO SOLN
20.0000 g | Freq: Every day | ORAL | 0 refills | Status: DC | PRN
  Filled 2023-08-06: qty 236, 7d supply, fill #0

## 2023-08-06 MED ORDER — PANTOPRAZOLE SODIUM 40 MG PO TBEC
40.0000 mg | DELAYED_RELEASE_TABLET | Freq: Two times a day (BID) | ORAL | 2 refills | Status: DC
  Filled 2023-08-06: qty 60, 30d supply, fill #0

## 2023-08-06 MED ORDER — FUROSEMIDE 20 MG PO TABS
20.0000 mg | ORAL_TABLET | Freq: Every day | ORAL | 0 refills | Status: DC | PRN
  Filled 2023-08-06: qty 30, 30d supply, fill #0

## 2023-08-06 NOTE — Care Management Important Message (Signed)
 Important Message  Patient Details IM Letter given. Name: AQUILLA VOILES MRN: [id number] Date of Birth: [birth year]   Important Message Given:  Yes - Medicare IM     Melba Ates 08/06/2023, 12:50 PM

## 2023-08-06 NOTE — Telephone Encounter (Signed)
 Capsule endo has been set up for 08/29/23 at 830 am instructions in the chart  EGD has been set up for 09/10/23 at 1145 am at [facility] with GM - all information has been added to the chart.  All information mailed to the pt home, sent to My Chart. Pt will be notified at discharge as well.

## 2023-08-06 NOTE — Discharge Summary (Signed)
 Physician Discharge Summary  Robert Greene FMW:[id number] DOB: [birth year] DOA: 08/01/2023  PCP: [name], MD  Admit date: 08/01/2023 Discharge date: 08/06/2023  Admitted From: home Discharge disposition: home  Recommendations at discharge:  You have been started on nadolol  20 mg daily.  Amlodipine  and losartan  have been stopped Target 2-3 bowel movements a day with lactulose  20 g twice as needed Stop alcohol use Continue Protonix  40 mg twice a day and Carafate  suspension 1 g twice daily for 2 weeks Can stop statin   Brief narrative: Robert Greene is a 63 y.o. male with PMH significant for chronic alcohol use, chronic smoking, alcoholic liver cirrhosis with esophageal varices, ILD/IPF, rheumatoid arthritis, HTN, HLD, CAD, anxiety 7/10, patient was brought to the ED by EMS from home with complaint of multiple episodes of rectal bleeding for about 24 hours.  Also reported associated lightheadedness leading to a syncopal episode, fell forward, hit his face then got a black eye.  In the ED, patient was afebrile, blood pressure in 160s, breathing room air Initial labs showed WBC count 6.8, hemoglobin 11.1, platelet 83, renal function normal, INR 1.2 CT head without acute abnormalities EDP discussed with GI Patient was started on IV Protonix , IV octreotide , IV Rocephin  Admitted to [facility]  7/11, EGD showed Grade I, grade II and grade III esophageal varices with no bleeding and evidence of red wale signs on grade 3 varices. Banded. Completely eradicated.  Also had portal hypertensive gastropathy. 7/11, colonoscopy showed - Hematin in the entire examined colon. Lavaged copiously. - A single mildly oozing colonic spot versus AVM s/p APC - Multiple 1 to 5 mm polyps at the recto-sigmoid colon, in the sigmoid colon and in the descending colon. Resection not attempted. -Small rectal varix - Non-bleeding non-thrombosed external and internal hemorrhoids.                Subjective: Patient  was seen and examined this morning.  Sitting up in bed.  Not in distress. Hemodynamically stable. Labs from this morning showed hemoglobin stable at 9.  Assessment and plan: Acute GI bleeding Acute blood loss anemia Presented with BRBPR, low hemoglobin in the setting of known liver cirrhosis Initially started on IV Protonix , octreotide  infusion 7/11, underwent EGD and colonoscopy -findings as above Per GI, etiology of patient's bleeding is not clearly defined with EGD and colonoscopy.  The small AVM in the right colon probably is not enough of a cause. VCE was thought to be necessary.  However, patient seems to have stopped bleeding, hemoglobin stable. Per GI, patient may be stable enough for VCE in next 2 to 3 weeks and repeat endoscopy in 3 to 5 weeks. Hemoglobin remains stable above 9 today. Able to tolerate soft diet. As recommended by GI, continue Protonix  40 mg twice daily and Carafate  suspension 1 g twice daily for 2 weeks  Discharge home to follow-up with GI as an outpatient Recent Labs    08/02/23 0407 08/02/23 1248 08/03/23 0406 08/03/23 0948 08/04/23 0423 08/05/23 0911 08/06/23 0352  HGB 9.3*   < > 9.2* 9.2* 8.5* 9.1* 9.0*  MCV 85.9  --  88.7  --  89.4 92.6 90.2  VITAMINB12 952*  --   --   --   --   --   --   FERRITIN 55  --   --   --   --   --   --   TIBC 314  --   --   --   --   --   --  IRON 151  --   --   --   --   --   --    < > = values in this interval not displayed.   Chronic alcoholism  Alcohol withdrawal symptoms Hepatic encephalopathy 7/11, patient had alcohol-related tremors and confusion. Probably he was drinking more than what he reported.  Confusion could be due to hepatic encephalopathy as well as ammonia level was elevated He was started on CIWA protocol with scheduled Librium  and as needed Ativan . No further withdrawal symptoms. Counseled to quit alcohol Started on lactulose  20 mg twice daily to flush out ammonia.  Target bowel movement 3-4  day. Continue the same plan at home  Alcoholic liver cirrhosis H/o esophageal varices, portal hypertensive gastropathy, thrombocytopenia RUQ ultrasound showed cirrhotic liver with hepatic steatosis and moderate volume ascites. Ultrasound RUQ showed small amount of ascites, primarily perihepatic and not safe window for paracentesis. Completed 5-day course of IV Rocephin  for SBP prophylaxis.  Platelet count low but stable. Recent Labs  Lab 08/01/23 1857 08/02/23 0407 08/02/23 1248 08/03/23 0406 08/04/23 0423 08/05/23 0911 08/06/23 0352  AST 63* 50*  --  54* 46*  --   --   ALT 34 28  --  30 26  --   --   ALKPHOS 106 85  --  82 84  --   --   BILITOT 2.8* 2.2*  --  2.3* 1.8*  --   --   PROT 6.8 5.9*  --  5.7* 5.6*  --   --   ALBUMIN  3.4* 3.0*  --  2.9* 2.8*  --   --   AMMONIA  --   --   --  39* 58*  --   --   INR 1.2  --  1.3*  --   --   --   --   PLT 83* 61*  --  57* 56* 69* 65*   HTN BP initially elevated with SBP in the 160s to 170s At home, he was on amlodipine  and losartan .  They were switched to nadolol  20 mg daily.  Given his portal hypertension, he would also benefit from Lasix  as needed for swelling.  His blood pressure is on the lower side of normal, does not look hypervolemic and hence I would hesitate to start him on scheduled diuretics at this time  Hyponatremia Sodium mildly low.  Trend as below in the setting of hypervolemia from portal hypertension. Recent Labs  Lab 08/01/23 1857 08/02/23 0407 08/03/23 0406 08/04/23 0423 08/06/23 0352  NA 135 131* 134* 138 134*    Pulmonary fibrosis/ILD Emphysema Chronic daily smoker Endorse mild dyspnea on exertion Reports he has been referred to the ILD clinic with appointment in September As needed DuoNebs Counseled to quit smoking   Rheumatoid arthritis Polymyalgia rheumatica Follows with [facility] rheumatology PA Continue hydroxychloroquine  200 mg daily Continue weekly Enbrel injections since   HLD On  atorvastatin .  I do not think he would benefit from statin going forward given liver cirrhosis.   Osteoarthritis As needed Tylenol    Mobility: Encourage ambulation.  PT recommended home with PT  Goals of care   Code Status: Full Code   Diet:  Diet Order             Diet general           Diet 2 gram sodium Room service appropriate? Yes; Fluid consistency: Thin  Diet effective now  Nutritional status:   Body mass index is 24.39 kg/m.       Wounds:  -    Discharge Exam:   Vitals:   08/05/23 1238 08/05/23 1952 08/05/23 1954 08/06/23 0354  BP: 132/65 (!) 97/53 (!) 100/55 125/66  Pulse: 70 61 61 61  Resp: (!) 21 17  17   Temp: 98 F (36.7 C) 98.5 F (36.9 C)  98.4 F (36.9 C)  TempSrc: Oral Oral  Oral  SpO2: 100% 100%  100%  Weight:      Height:        Body mass index is 24.39 kg/m.  General exam: Pleasant, middle-aged not in distress. Skin: No rashes, lesions or ulcers. HEENT: Atraumatic, normocephalic, no obvious bleeding Lungs: Clear to auscultation bilaterally,  CVS: S1, S2, no murmur,   GI/Abd: Soft, nontender, nondistended, bowel sound present,   CNS: Alert, awake, oriented x 3.  No tremors psychiatry: Mood appropriate Extremities: Trace bilateral pedal edema, no calf tenderness,   Follow ups:    Follow-up Information     [name], MD Follow up.   Specialty: Family Medicine Contact information: [address] [place] [address] [phone]                 Discharge Instructions:   Discharge Instructions     Call MD for:  difficulty breathing, headache or visual disturbances   Complete by: As directed    Call MD for:  extreme fatigue   Complete by: As directed    Call MD for:  hives   Complete by: As directed    Call MD for:  persistant dizziness or light-headedness   Complete by: As directed    Call MD for:  persistant nausea and vomiting   Complete by: As directed    Call MD for:   severe uncontrolled pain   Complete by: As directed    Call MD for:  temperature >100.4   Complete by: As directed    Diet general   Complete by: As directed    Discharge instructions   Complete by: As directed    Recommendations at discharge:   You have been started on nadolol  20 mg daily.  Amlodipine  and losartan  have been stopped  Target 2-3 bowel movements a day with lactulose  20 g twice as needed  Stop alcohol use  Continue Protonix  40 mg twice a day and Carafate  suspension 1 g twice daily for 2 weeks  Can stop statin   General discharge instructions: Follow with Primary MD [name], MD in 7 days  Please request your PCP  to go over your hospital tests, procedures, radiology results at the follow up. Please get your medicines reviewed and adjusted.  Your PCP may decide to repeat certain labs or tests as needed. Do not drive, operate heavy machinery, perform activities at heights, swimming or participation in water activities or provide baby sitting services if your were admitted for syncope or siezures until you have seen by Primary MD or a Neurologist and advised to do so again. [place]  Controlled Substance Reporting System database was reviewed. Do not drive, operate heavy machinery, perform activities at heights, swim, participate in water activities or provide baby-sitting services while on medications for pain, sleep and mood until your outpatient physician has reevaluated you and advised to do so again.  You are strongly recommended to comply with the dose, frequency and duration of prescribed medications. Activity: As tolerated with Full fall precautions use walker/cane &  assistance as needed Avoid using any recreational substances like cigarette, tobacco, alcohol, or non-prescribed drug. If you experience worsening of your admission symptoms, develop shortness of breath, life threatening emergency, suicidal or homicidal thoughts you must seek medical attention  immediately by calling 911 or calling your MD immediately  if symptoms less severe. You must read complete instructions/literature along with all the possible adverse reactions/side effects for all the medicines you take and that have been prescribed to you. Take any new medicine only after you have completely understood and accepted all the possible adverse reactions/side effects.  Wear Seat belts while driving. You were cared for by a hospitalist during your hospital stay. If you have any questions about your discharge medications or the care you received while you were in the hospital after you are discharged, you can call the unit and ask to speak with the hospitalist or the covering physician. Once you are discharged, your primary care physician will handle any further medical issues. Please note that NO REFILLS for any discharge medications will be authorized once you are discharged, as it is imperative that you return to your primary care physician (or establish a relationship with a primary care physician if you do not have one).   Increase activity slowly   Complete by: As directed        Discharge Medications:   Allergies as of 08/06/2023       Reactions   Bee Venom Anaphylaxis, Hives, Rash   Spider Antivenin [s Black Widow (latrodec Mactans) Antivenin] Anaphylaxis, Hives, Rash        Medication List     STOP taking these medications    amLODipine  5 MG tablet Commonly known as: NORVASC    atorvastatin  80 MG tablet Commonly known as: LIPITOR    losartan  100 MG tablet Commonly known as: COZAAR    omeprazole  20 MG capsule Commonly known as: PRILOSEC   sertraline 50 MG tablet Commonly known as: ZOLOFT       TAKE these medications    EPINEPHrine  0.3 mg/0.3 mL Soaj injection Commonly known as: EPI-PEN Use once.  If ineffective, use 2nd dose. What changed:  how much to take how to take this when to take this reasons to take this additional instructions    etanercept 50 MG/ML injection Commonly known as: ENBREL Inject 50 mg into the skin once a week. Saturday   FISH OIL PO Take 1 capsule by mouth 2 (two) times daily.   furosemide  20 MG tablet Commonly known as: Lasix  Take 1 tablet (20 mg total) by mouth daily as needed for fluid or edema.   hydroxychloroquine  200 MG tablet Commonly known as: PLAQUENIL  Take 200 mg by mouth daily.   lactulose  10 GM/15ML solution Commonly known as: CHRONULAC  Take 30 mLs (20 g total) by mouth daily as needed for mild constipation (Target 2-3 bowel movements a day to flush out ammonia).   nadolol  20 MG tablet Commonly known as: CORGARD  Take 1 tablet (20 mg total) by mouth daily. Start taking on: August 07, 2023   pantoprazole  40 MG tablet Commonly known as: PROTONIX  Take 1 tablet (40 mg total) by mouth 2 (two) times daily.   sucralfate  1 GM/10ML suspension Commonly known as: CARAFATE  Take 10 mLs (1 g total) by mouth 2 (two) times daily for 14 days.   triamcinolone  cream 0.1 % Commonly known as: KENALOG Apply 1 Application topically 2 (two) times daily.         The results of significant diagnostics from this hospitalization (  including imaging, microbiology, ancillary and laboratory) are listed below for reference.    Procedures and Diagnostic Studies:   US  ASCITES (ABDOMEN LIMITED) Result Date: 08/02/2023 CLINICAL DATA:  Patient with history of alcoholic cirrhosis, esophageal varices, portal hypertension, thrombocytopenia, reported ascites. Evaluate for paracentesis. EXAM: LIMITED ABDOMEN ULTRASOUND FOR ASCITES COMPARISON:  Ultrasound abdomen limited right upper quadrant 08/02/23 FINDINGS: All 4 quadrants of abdomen scanned for ascites. Small amount of ascites noted in perihepatic region. No safe window available for paracentesis. IMPRESSION: Small amount of ascites present, primarily perihepatic. No safe window for paracentesis at this time. Procedure not performed. Performed by: Franky Kelsie RIGGERS Electronically Signed   By: Juliene Balder M.D.   On: 08/02/2023 18:15   US  Abdomen Limited RUQ (LIVER/GB) Result Date: 08/02/2023 CLINICAL DATA:  [id number] Abdominal ascites [id number] 142212 Cirrhosis of liver (HCC) 142212 EXAM: ULTRASOUND ABDOMEN LIMITED RIGHT UPPER QUADRANT COMPARISON:  March 02, 2023, February 27, 2023 FINDINGS: Gallbladder: Small volume biliary sludge with a single small gallstone. Circumferential wall thickening of the gallbladder. No sonographic Murphy's sign noted by sonographer. Common bile duct: Diameter: 3 mm Liver: Nodular contour with increased echogenicity. No focal lesion identified. No intrahepatic biliary ductal dilation. Portal vein is patent on color Doppler imaging with normal direction of blood flow towards the liver. Right Kidney: Partially visualized. No mass. No hydronephrosis or nephrolithiasis. Other: Moderate volume perihepatic ascites IMPRESSION: 1. Circumferential gallbladder wall thickening, which in the absence of a sonographic Murphy sign, is likely due to underlying chronic liver disease. If there is further concern for acute cholecystitis, a nuclear medicine hepatobiliary scan could be considered. 2. Cirrhotic liver with hepatic steatosis. Moderate volume perihepatic ascites. Electronically Signed   By: Rogelia Myers M.D.   On: 08/02/2023 08:27   CT Head Wo Contrast Result Date: 08/01/2023 CLINICAL DATA:  Lightheadedness, syncope, fall, vomiting EXAM: CT HEAD WITHOUT CONTRAST TECHNIQUE: Contiguous axial images were obtained from the base of the skull through the vertex without intravenous contrast. RADIATION DOSE REDUCTION: This exam was performed according to the departmental dose-optimization program which includes automated exposure control, adjustment of the mA and/or kV according to patient size and/or use of iterative reconstruction technique. COMPARISON:  04/03/2015 FINDINGS: Brain: No evidence of acute infarction, hemorrhage, hydrocephalus,  extra-axial collection or mass lesion/mass effect. Periventricular white matter hypodensity. Vascular: No hyperdense vessel or unexpected calcification. Skull: Normal. Negative for fracture or focal lesion. Sinuses/Orbits: No acute finding. Other: None. IMPRESSION: No acute intracranial pathology. Electronically Signed   By: Marolyn JONETTA Jaksch M.D.   On: 08/01/2023 19:54     Labs:   Basic Metabolic Panel: Recent Labs  Lab 08/01/23 1857 08/02/23 0407 08/03/23 0406 08/04/23 0423 08/06/23 0352  NA 135 131* 134* 138 134*  K 3.6 3.6 3.5 3.6 3.3*  CL 107 103 106 110 104  CO2 20* 20* 21* 23 21*  GLUCOSE 116* 101* 91 114* 91  BUN 14 16 11 8  6*  CREATININE 0.85 1.01 0.77 0.88 0.76  CALCIUM  9.2 8.5* 8.6* 8.5* 8.3*   GFR Estimated Creatinine Clearance: 113 mL/min (by C-G formula based on SCr of 0.76 mg/dL). Liver Function Tests: Recent Labs  Lab 08/01/23 1857 08/02/23 0407 08/03/23 0406 08/04/23 0423  AST 63* 50* 54* 46*  ALT 34 28 30 26   ALKPHOS 106 85 82 84  BILITOT 2.8* 2.2* 2.3* 1.8*  PROT 6.8 5.9* 5.7* 5.6*  ALBUMIN  3.4* 3.0* 2.9* 2.8*   No results for input(s): LIPASE, AMYLASE in the last 168 hours. Recent Labs  Lab 08/03/23 0406 08/04/23 0423  AMMONIA 39* 58*   Coagulation profile Recent Labs  Lab 08/01/23 1857 08/02/23 1248  INR 1.2 1.3*    CBC: Recent Labs  Lab 08/01/23 1857 08/02/23 0407 08/02/23 1248 08/03/23 0406 08/03/23 0948 08/04/23 0423 08/05/23 0911 08/06/23 0352  WBC 6.8 5.1  --  5.6  --  4.3 6.2 6.1  NEUTROABS 4.3  --   --  2.9  --  2.1  --  3.2  HGB 11.1* 9.3*   < > 9.2* 9.2* 8.5* 9.1* 9.0*  HCT 33.0* 27.5*   < > 28.2* 27.9* 26.1* 28.7* 27.7*  MCV 87.3 85.9  --  88.7  --  89.4 92.6 90.2  PLT 83* 61*  --  57*  --  56* 69* 65*   < > = values in this interval not displayed.   Cardiac Enzymes: No results for input(s): CKTOTAL, CKMB, CKMBINDEX, TROPONINI in the last 168 hours. BNP: Invalid input(s): POCBNP CBG: Recent Labs   Lab 08/01/23 2240  GLUCAP 129*   D-Dimer No results for input(s): DDIMER in the last 72 hours. Hgb A1c No results for input(s): HGBA1C in the last 72 hours. Lipid Profile No results for input(s): CHOL, HDL, LDLCALC, TRIG, CHOLHDL, LDLDIRECT in the last 72 hours. Thyroid function studies No results for input(s): TSH, T4TOTAL, T3FREE, THYROIDAB in the last 72 hours.  Invalid input(s): FREET3 Anemia work up No results for input(s): VITAMINB12, FOLATE, FERRITIN, TIBC, IRON, RETICCTPCT in the last 72 hours. Microbiology No results found for this or any previous visit (from the past 240 hours).  Time coordinating discharge: 45 minutes  Signed: Anuj Summons  [facility] 08/06/2023, 11:46 AM

## 2023-08-06 NOTE — Telephone Encounter (Signed)
-----   Message from [facility] sent at 08/03/2023  8:46 PM EDT ----- Regarding: Followup EGD Robert Greene, This patient will need repeat EGD in 3-5 weeks for variceal banding protocol. Please schedule as able. Thanks. GM

## 2023-08-06 NOTE — TOC Transition Note (Signed)
 Transition of Care [facility]) - Discharge Note   Patient Details  Name: Robert Greene MRN: [id number] Date of Birth: 1960/05/04  Transition of Care [facility] CM/SW Contact:  Tawni CHRISTELLA Eva, LCSW Phone Number: 08/06/2023, 12:15 PM   Clinical Narrative:     Pt to d/c home, CSW discuss rec for [facility] services, pt with no preference.[facility] accepted pt for HHPT/OT.  Pt reports his son will provide transportation upon d/c. No further TOC needs, TOC sign off.    Final next level of care: Home w Home Health Services Barriers to Discharge: Barriers Resolved   Patient Goals and CMS Choice Patient states their goals for this hospitalization and ongoing recovery are:: return home with  home health CMS Medicare.gov Compare Post Acute Care list provided to:: Patient Choice offered to / list presented to : Patient      Discharge Placement                    Patient and family notified of of transfer: 08/06/23  Discharge Plan and Services Additional resources added to the After Visit Summary for                            [facility] Arranged: PT, OT HH Agency: [facility] Date [facility] Agency Contacted: 08/06/23 Time HH Agency Contacted: 1214 Representative spoke with at [facility] Agency: [place]  Social Drivers of Health (SDOH) Interventions SDOH Screenings   Food Insecurity: No Food Insecurity (08/01/2023)  Housing: Low Risk  (08/01/2023)  Transportation Needs: No Transportation Needs (08/01/2023)  Utilities: Not At Risk (08/01/2023)  Tobacco Use: High Risk (08/03/2023)     Readmission Risk Interventions     No data to display

## 2023-08-06 NOTE — Telephone Encounter (Signed)
-----   Message from [facility] sent at 08/05/2023 11:50 AM EDT ----- Regarding: Follow-up Narcisa Ganesh, This patient needs, if possible, a video capsule endoscopy in the next 2-1/2 to 3-1/2 weeks. This is to evaluate the small bowel in the setting of iron deficiency anemia, bright red blood per rectum. I need an upper endoscopy for variceal surveillance in 5 weeks in the hospital. He will be discharged on Sunday or Monday. Thanks. GM

## 2023-08-07 ENCOUNTER — Other Ambulatory Visit (HOSPITAL_COMMUNITY): Payer: Self-pay

## 2023-08-07 LAB — SURGICAL PATHOLOGY

## 2023-08-10 ENCOUNTER — Ambulatory Visit: Payer: Self-pay | Admitting: Gastroenterology

## 2023-08-13 DIAGNOSIS — M059 Rheumatoid arthritis with rheumatoid factor, unspecified: Secondary | ICD-10-CM | POA: Diagnosis not present

## 2023-08-13 DIAGNOSIS — I10 Essential (primary) hypertension: Secondary | ICD-10-CM | POA: Diagnosis not present

## 2023-08-13 DIAGNOSIS — J439 Emphysema, unspecified: Secondary | ICD-10-CM | POA: Diagnosis not present

## 2023-08-13 DIAGNOSIS — I2584 Coronary atherosclerosis due to calcified coronary lesion: Secondary | ICD-10-CM | POA: Diagnosis not present

## 2023-08-13 DIAGNOSIS — I2510 Atherosclerotic heart disease of native coronary artery without angina pectoris: Secondary | ICD-10-CM | POA: Diagnosis not present

## 2023-08-13 DIAGNOSIS — K922 Gastrointestinal hemorrhage, unspecified: Secondary | ICD-10-CM | POA: Diagnosis not present

## 2023-08-13 DIAGNOSIS — D596 Hemoglobinuria due to hemolysis from other external causes: Secondary | ICD-10-CM | POA: Diagnosis not present

## 2023-08-13 DIAGNOSIS — M069 Rheumatoid arthritis, unspecified: Secondary | ICD-10-CM | POA: Diagnosis not present

## 2023-08-13 DIAGNOSIS — D696 Thrombocytopenia, unspecified: Secondary | ICD-10-CM | POA: Diagnosis not present

## 2023-08-13 DIAGNOSIS — K7682 Hepatic encephalopathy: Secondary | ICD-10-CM | POA: Diagnosis not present

## 2023-08-13 DIAGNOSIS — K7031 Alcoholic cirrhosis of liver with ascites: Secondary | ICD-10-CM | POA: Diagnosis not present

## 2023-08-17 DIAGNOSIS — K922 Gastrointestinal hemorrhage, unspecified: Secondary | ICD-10-CM | POA: Diagnosis not present

## 2023-08-17 DIAGNOSIS — I2510 Atherosclerotic heart disease of native coronary artery without angina pectoris: Secondary | ICD-10-CM | POA: Diagnosis not present

## 2023-08-17 DIAGNOSIS — K7682 Hepatic encephalopathy: Secondary | ICD-10-CM | POA: Diagnosis not present

## 2023-08-17 DIAGNOSIS — I10 Essential (primary) hypertension: Secondary | ICD-10-CM | POA: Diagnosis not present

## 2023-08-17 DIAGNOSIS — J439 Emphysema, unspecified: Secondary | ICD-10-CM | POA: Diagnosis not present

## 2023-08-17 DIAGNOSIS — M069 Rheumatoid arthritis, unspecified: Secondary | ICD-10-CM | POA: Diagnosis not present

## 2023-08-17 DIAGNOSIS — D696 Thrombocytopenia, unspecified: Secondary | ICD-10-CM | POA: Diagnosis not present

## 2023-08-17 DIAGNOSIS — I2584 Coronary atherosclerosis due to calcified coronary lesion: Secondary | ICD-10-CM | POA: Diagnosis not present

## 2023-08-17 DIAGNOSIS — K7031 Alcoholic cirrhosis of liver with ascites: Secondary | ICD-10-CM | POA: Diagnosis not present

## 2023-08-23 DIAGNOSIS — J449 Chronic obstructive pulmonary disease, unspecified: Secondary | ICD-10-CM | POA: Diagnosis not present

## 2023-08-23 DIAGNOSIS — R7301 Impaired fasting glucose: Secondary | ICD-10-CM | POA: Diagnosis not present

## 2023-08-23 DIAGNOSIS — I2584 Coronary atherosclerosis due to calcified coronary lesion: Secondary | ICD-10-CM | POA: Diagnosis not present

## 2023-08-23 DIAGNOSIS — D696 Thrombocytopenia, unspecified: Secondary | ICD-10-CM | POA: Diagnosis not present

## 2023-08-23 DIAGNOSIS — J439 Emphysema, unspecified: Secondary | ICD-10-CM | POA: Diagnosis not present

## 2023-08-23 DIAGNOSIS — K7460 Unspecified cirrhosis of liver: Secondary | ICD-10-CM | POA: Diagnosis not present

## 2023-08-23 DIAGNOSIS — K7031 Alcoholic cirrhosis of liver with ascites: Secondary | ICD-10-CM | POA: Diagnosis not present

## 2023-08-23 DIAGNOSIS — K922 Gastrointestinal hemorrhage, unspecified: Secondary | ICD-10-CM | POA: Diagnosis not present

## 2023-08-23 DIAGNOSIS — K7682 Hepatic encephalopathy: Secondary | ICD-10-CM | POA: Diagnosis not present

## 2023-08-23 DIAGNOSIS — M069 Rheumatoid arthritis, unspecified: Secondary | ICD-10-CM | POA: Diagnosis not present

## 2023-08-23 DIAGNOSIS — I10 Essential (primary) hypertension: Secondary | ICD-10-CM | POA: Diagnosis not present

## 2023-08-23 DIAGNOSIS — I2510 Atherosclerotic heart disease of native coronary artery without angina pectoris: Secondary | ICD-10-CM | POA: Diagnosis not present

## 2023-08-23 DIAGNOSIS — E7800 Pure hypercholesterolemia, unspecified: Secondary | ICD-10-CM | POA: Diagnosis not present

## 2023-08-23 DIAGNOSIS — F17200 Nicotine dependence, unspecified, uncomplicated: Secondary | ICD-10-CM | POA: Diagnosis not present

## 2023-08-29 ENCOUNTER — Other Ambulatory Visit: Payer: Self-pay

## 2023-08-29 ENCOUNTER — Ambulatory Visit (INDEPENDENT_AMBULATORY_CARE_PROVIDER_SITE_OTHER): Admitting: Gastroenterology

## 2023-08-29 ENCOUNTER — Encounter (HOSPITAL_COMMUNITY): Payer: Self-pay | Admitting: Gastroenterology

## 2023-08-29 DIAGNOSIS — K3189 Other diseases of stomach and duodenum: Secondary | ICD-10-CM

## 2023-08-29 DIAGNOSIS — K766 Portal hypertension: Secondary | ICD-10-CM

## 2023-08-29 DIAGNOSIS — D509 Iron deficiency anemia, unspecified: Secondary | ICD-10-CM

## 2023-08-29 NOTE — Progress Notes (Addendum)
 COVID Vaccine Completed:  Date of COVID positive in last 90 days:  PCP - Cheryle Frees, MD Cardiologist - Sunit Moscow, DO LOV 2021 Pulmonologist- Dorethia Cave, MD 02/14/22  Chest x-ray - 02/27/23 Epic EKG - 02/28/23 Epic Stress Test - 04/16/17 Epic ECHO - n/a Cardiac Cath - n/a Pacemaker/ICD device last checked: n/a Spinal Cord Stimulator: n/a  Bowel Prep - NPO after midnight  Sleep Study - n/a CPAP -   Fasting Blood Sugar - n/a Checks Blood Sugar _____ times a day  Last dose of GLP1 agonist-  N/A GLP1 instructions:  Do not take after     Last dose of SGLT-2 inhibitors-  N/A SGLT-2 instructions:  Do not take after     Blood Thinner Instructions:  Last dose: n/a  Time: Aspirin  Instructions: Last Dose:  Activity level: Can perform activities of daily living without stopping and without symptoms of chest pain or shortness of breath. Difficulty with stairs, due to weakness. Has wheelchair available at home if needed  Anesthesia review: HTN, ILD,coronary artery calcifications, liver cirrhosis  Sent chart to [place], [place], she stated as long as breathing is okay he is okay for procedure. Patient reported breathing is stable currently.  Patient denies shortness of breath, fever, cough and chest pain at PAT appointment  Patient verbalized understanding of instructions that were given to them at the PAT appointment. Patient was also instructed that they will need to review over the PAT instructions again at home before surgery.

## 2023-08-29 NOTE — Progress Notes (Signed)
 SN: [id number] Exp: 2026 03 13 LOT: [id number] Patient arrived for Capsule Endoscopy. Reported the prep went well. This nurse explained dietary restrictions for the next few hours. Patient verbalized understanding. Opened capsule, ensured capsule was flashing prior to the patient swallowing the capsule. Patient swallowed capsule without difficulty. Patient instructed to return to the office at 4:00 pm today for removal of the recording equipment, to call the office with any questions and if no capsule was visualized after 72 hours. No further questions by the conclusion of the visit.

## 2023-08-29 NOTE — Patient Instructions (Signed)
POST CAPSULE INSTRUCTIONS:  Contact our office immediately at [phone] if you suffer from any abdominal pain, nausea, or vomiting during capsule endoscopy. Do not eat or drink for at least 2 hours. After 2 hours you may have any of the following to drink: Water   White grape juice 7-Up   Chicken Bouillon Sprite   Ginger Ale After 4 hours you may have a light snack to include any of the following: A cup of soup   sandwich Bowl of cereal  Rice Toast   Eggs 2-3 small cookies (i.e. vanilla wafers or graham crackers) After 8 hours you may return to your regular diet. During your procedure do not go near anyone else that is having capsule endoscopy. Do not be in close contact with an MRI machine or a radio or television tower. Do not wear a heavy coat or sweater because your recorder may over heat and stop recording.   Do not disconnect the equipment or remove the belt at any time.  Since the Data Recorder is actually a small computer, it should be treated with utmost care and protection.  Avoid sudden movement and banging of the Data Recorder.  Do not do any heavy lifting or strenuous physical activity during the test especially if it involves sweating and do not bend over or stoop during capsule endoscopy. During capsule endoscopy, you will need to verify every 15 minutes that the small light on top of the Data Recorder is blinking twice per second.  If for some reason it stops blinking at this site, record the time and contact our office at [phone].  

## 2023-09-03 ENCOUNTER — Telehealth: Payer: Self-pay

## 2023-09-03 ENCOUNTER — Telehealth: Payer: Self-pay | Admitting: Gastroenterology

## 2023-09-03 NOTE — Telephone Encounter (Addendum)
 Called [phone],  no answer, left a message for patient to return my call.

## 2023-09-03 NOTE — Telephone Encounter (Signed)
 Pt has out of network insurance and EGD will not be covered- he will have to pay $4000 up front to proceed.  Attempted to reach pt to make him aware and see if he is willing to pay up front.   Left message on machine to call back   FYI Dr Wilhelmenia

## 2023-09-03 NOTE — Telephone Encounter (Signed)
 Patient's returning phone call. Requesting a call at [phone]. Please advise, thank you

## 2023-09-03 NOTE — Telephone Encounter (Signed)
 Left message on machine to call back

## 2023-09-03 NOTE — Telephone Encounter (Signed)
 Patient's daughter returned phone call. States she believes patient's insurance has changed and will call back once she gets new insurance information.

## 2023-09-03 NOTE — Telephone Encounter (Signed)
 I am sorry to hear this. Hopefully he will be able to see us . Once we get the new information of his insurance, can you please confirm if we can continue to see him as a patient or if we need to refer him to one of the other groups. Thanks. GM

## 2023-09-03 NOTE — Telephone Encounter (Signed)
 Procedure:Endoscopy Procedure date: 09/10/23 Procedure location: [facility] Arrival Time: 10:15 am Spoke with the patient Y/N:   No, I left a detailed message on [phone] on 09/03/23 @ 10:24 am for the patient to return call    Any prep concerns? ___  Has the patient obtained the prep from the pharmacy ? ___ Do you have a care partner and transportation: ___ Any additional concerns? ___

## 2023-09-04 NOTE — Telephone Encounter (Signed)
 I was able to reach the pt and discuss the insurance issue and [facility] being out of network.  He is not sure how or why his insurance has changed and he is upset that he cannot continue his care with Dr Wilhelmenia.  I asked that he reach out to his insurance and see where he is covered and we can make a referral to that practice.  He is really upset understandably.  He will be cancelled at this time.

## 2023-09-05 NOTE — Telephone Encounter (Signed)
 Note sent to PCP

## 2023-09-05 NOTE — Telephone Encounter (Signed)
 Procedure cancelled.

## 2023-09-05 NOTE — Telephone Encounter (Signed)
 Thank you Patty. Can you send this to PCP so they are aware as well? GM

## 2023-09-07 NOTE — Progress Notes (Signed)
 Formal video capsule endoscopy report will be put/scanned into the chart  Complete VCE with adequate preparation. Portal gastropathy noted in stomach. Some mild edema and blunting of villi in the duodenum, possible related to portal hypertension but no other abnormalities noted in the rest of the small bowel. No evidence of small bowel findings to relay etiology for iron deficiency anemia.

## 2023-09-10 ENCOUNTER — Ambulatory Visit (HOSPITAL_COMMUNITY): Admission: RE | Admit: 2023-09-10 | Source: Home / Self Care | Admitting: Gastroenterology

## 2023-09-10 SURGERY — EGD (ESOPHAGOGASTRODUODENOSCOPY)
Anesthesia: Monitor Anesthesia Care

## 2023-09-10 NOTE — Telephone Encounter (Signed)
 Attempted to reach patient. No answer, left VM for patient to return call.

## 2023-09-10 NOTE — Telephone Encounter (Signed)
 Patient is calling back to discuss why his procedure was cancelled. Patient is requesting a call back. Please advise.

## 2023-09-11 ENCOUNTER — Encounter: Payer: Self-pay | Admitting: *Deleted

## 2023-09-11 NOTE — Progress Notes (Signed)
 Spoke to patient's daughter, [name] (ok per DPR) to advise of results from capsule endoscopy as per Dr Wilhelmenia. She verbalizes understanding and is advised that currently, no additional work up is needed patient's anemia. She also requests that we send result in writing through mychart so patient can read this after her conversation relaying information.

## 2023-09-14 ENCOUNTER — Other Ambulatory Visit: Payer: Self-pay

## 2023-09-14 ENCOUNTER — Emergency Department (HOSPITAL_COMMUNITY)

## 2023-09-14 ENCOUNTER — Encounter (HOSPITAL_COMMUNITY): Payer: Self-pay

## 2023-09-14 ENCOUNTER — Observation Stay (HOSPITAL_COMMUNITY)
Admission: EM | Admit: 2023-09-14 | Discharge: 2023-09-20 | Disposition: A | Discharge status: Home / Self Care | Attending: Internal Medicine | Admitting: Internal Medicine

## 2023-09-14 DIAGNOSIS — K766 Portal hypertension: Secondary | ICD-10-CM | POA: Insufficient documentation

## 2023-09-14 DIAGNOSIS — F1020 Alcohol dependence, uncomplicated: Secondary | ICD-10-CM | POA: Diagnosis not present

## 2023-09-14 DIAGNOSIS — H02401 Unspecified ptosis of right eyelid: Secondary | ICD-10-CM | POA: Insufficient documentation

## 2023-09-14 DIAGNOSIS — F1010 Alcohol abuse, uncomplicated: Secondary | ICD-10-CM | POA: Diagnosis not present

## 2023-09-14 DIAGNOSIS — K7290 Hepatic failure, unspecified without coma: Secondary | ICD-10-CM | POA: Diagnosis not present

## 2023-09-14 DIAGNOSIS — D649 Anemia, unspecified: Secondary | ICD-10-CM | POA: Diagnosis present

## 2023-09-14 DIAGNOSIS — I8510 Secondary esophageal varices without bleeding: Secondary | ICD-10-CM | POA: Diagnosis not present

## 2023-09-14 DIAGNOSIS — M0510 Rheumatoid lung disease with rheumatoid arthritis of unspecified site: Secondary | ICD-10-CM | POA: Insufficient documentation

## 2023-09-14 DIAGNOSIS — K3189 Other diseases of stomach and duodenum: Secondary | ICD-10-CM | POA: Diagnosis not present

## 2023-09-14 DIAGNOSIS — R197 Diarrhea, unspecified: Secondary | ICD-10-CM | POA: Diagnosis not present

## 2023-09-14 DIAGNOSIS — H547 Unspecified visual loss: Secondary | ICD-10-CM | POA: Diagnosis not present

## 2023-09-14 DIAGNOSIS — R296 Repeated falls: Principal | ICD-10-CM | POA: Insufficient documentation

## 2023-09-14 DIAGNOSIS — R609 Edema, unspecified: Secondary | ICD-10-CM | POA: Diagnosis not present

## 2023-09-14 DIAGNOSIS — R531 Weakness: Secondary | ICD-10-CM | POA: Diagnosis not present

## 2023-09-14 DIAGNOSIS — K7030 Alcoholic cirrhosis of liver without ascites: Secondary | ICD-10-CM | POA: Diagnosis present

## 2023-09-14 DIAGNOSIS — R509 Fever, unspecified: Secondary | ICD-10-CM | POA: Diagnosis not present

## 2023-09-14 DIAGNOSIS — K7031 Alcoholic cirrhosis of liver with ascites: Secondary | ICD-10-CM | POA: Diagnosis not present

## 2023-09-14 DIAGNOSIS — I8500 Esophageal varices without bleeding: Secondary | ICD-10-CM | POA: Diagnosis present

## 2023-09-14 DIAGNOSIS — D61818 Other pancytopenia: Secondary | ICD-10-CM | POA: Insufficient documentation

## 2023-09-14 DIAGNOSIS — R58 Hemorrhage, not elsewhere classified: Secondary | ICD-10-CM | POA: Diagnosis not present

## 2023-09-14 DIAGNOSIS — R519 Headache, unspecified: Secondary | ICD-10-CM | POA: Diagnosis not present

## 2023-09-14 DIAGNOSIS — F17210 Nicotine dependence, cigarettes, uncomplicated: Secondary | ICD-10-CM | POA: Insufficient documentation

## 2023-09-14 DIAGNOSIS — D500 Iron deficiency anemia secondary to blood loss (chronic): Secondary | ICD-10-CM | POA: Insufficient documentation

## 2023-09-14 DIAGNOSIS — J849 Interstitial pulmonary disease, unspecified: Secondary | ICD-10-CM | POA: Diagnosis not present

## 2023-09-14 DIAGNOSIS — E876 Hypokalemia: Secondary | ICD-10-CM | POA: Insufficient documentation

## 2023-09-14 DIAGNOSIS — F17200 Nicotine dependence, unspecified, uncomplicated: Secondary | ICD-10-CM

## 2023-09-14 DIAGNOSIS — I672 Cerebral atherosclerosis: Secondary | ICD-10-CM | POA: Diagnosis not present

## 2023-09-14 DIAGNOSIS — R29818 Other symptoms and signs involving the nervous system: Secondary | ICD-10-CM | POA: Diagnosis not present

## 2023-09-14 DIAGNOSIS — R110 Nausea: Secondary | ICD-10-CM

## 2023-09-14 DIAGNOSIS — I10 Essential (primary) hypertension: Secondary | ICD-10-CM | POA: Insufficient documentation

## 2023-09-14 DIAGNOSIS — J984 Other disorders of lung: Secondary | ICD-10-CM | POA: Diagnosis not present

## 2023-09-14 DIAGNOSIS — K7682 Hepatic encephalopathy: Secondary | ICD-10-CM | POA: Diagnosis not present

## 2023-09-14 LAB — URINALYSIS, ROUTINE W REFLEX MICROSCOPIC
Bilirubin Urine: NEGATIVE
Glucose, UA: NEGATIVE mg/dL
Hgb urine dipstick: NEGATIVE
Ketones, ur: NEGATIVE mg/dL
Leukocytes,Ua: NEGATIVE
Nitrite: NEGATIVE
Protein, ur: NEGATIVE mg/dL
Specific Gravity, Urine: 1.015 (ref 1.005–1.030)
pH: 6 (ref 5.0–8.0)

## 2023-09-14 LAB — ETHANOL: Alcohol, Ethyl (B): 188 mg/dL — ABNORMAL HIGH (ref <15)

## 2023-09-14 LAB — CBC
HCT: 27.7 % — ABNORMAL LOW (ref 39.0–52.0)
Hemoglobin: 8.5 g/dL — ABNORMAL LOW (ref 13.0–17.0)
MCH: 25.4 pg — ABNORMAL LOW (ref 26.0–34.0)
MCHC: 30.7 g/dL (ref 30.0–36.0)
MCV: 82.9 fL (ref 80.0–100.0)
Platelets: 43 K/uL — ABNORMAL LOW (ref 150–400)
RBC: 3.34 MIL/uL — ABNORMAL LOW (ref 4.22–5.81)
RDW: 16 % — ABNORMAL HIGH (ref 11.5–15.5)
WBC: 2.9 K/uL — ABNORMAL LOW (ref 4.0–10.5)
nRBC: 0 % (ref 0.0–0.2)

## 2023-09-14 LAB — COMPREHENSIVE METABOLIC PANEL WITH GFR
ALT: 34 U/L (ref 0–44)
AST: 93 U/L — ABNORMAL HIGH (ref 15–41)
Albumin: 3.3 g/dL — ABNORMAL LOW (ref 3.5–5.0)
Alkaline Phosphatase: 111 U/L (ref 38–126)
Anion gap: 11 (ref 5–15)
BUN: 7 mg/dL — ABNORMAL LOW (ref 8–23)
CO2: 21 mmol/L — ABNORMAL LOW (ref 22–32)
Calcium: 8.4 mg/dL — ABNORMAL LOW (ref 8.9–10.3)
Chloride: 109 mmol/L (ref 98–111)
Creatinine, Ser: 0.64 mg/dL (ref 0.61–1.24)
GFR, Estimated: >60 mL/min (ref >60)
Glucose, Bld: 100 mg/dL — ABNORMAL HIGH (ref 70–99)
Potassium: 3.2 mmol/L — ABNORMAL LOW (ref 3.5–5.1)
Sodium: 141 mmol/L (ref 135–145)
Total Bilirubin: 1.4 mg/dL — ABNORMAL HIGH (ref 0.0–1.2)
Total Protein: 6.9 g/dL (ref 6.5–8.1)

## 2023-09-14 LAB — AMMONIA: Ammonia: 34 umol/L (ref 9–35)

## 2023-09-14 MED ORDER — NADOLOL 20 MG PO TABS
20.0000 mg | ORAL_TABLET | Freq: Every day | ORAL | Status: DC
  Administered 2023-09-14 – 2023-09-15 (×2): 20 mg via ORAL
  Filled 2023-09-14 (×3): qty 1

## 2023-09-14 MED ORDER — FOLIC ACID 1 MG PO TABS
1.0000 mg | ORAL_TABLET | Freq: Every day | ORAL | Status: DC
  Administered 2023-09-14 – 2023-09-20 (×7): 1 mg via ORAL
  Filled 2023-09-14 (×7): qty 1

## 2023-09-14 MED ORDER — HYDROXYCHLOROQUINE SULFATE 200 MG PO TABS
200.0000 mg | ORAL_TABLET | Freq: Every day | ORAL | Status: DC
  Administered 2023-09-15 – 2023-09-20 (×6): 200 mg via ORAL
  Filled 2023-09-14 (×6): qty 1

## 2023-09-14 MED ORDER — LORAZEPAM 1 MG PO TABS
1.0000 mg | ORAL_TABLET | ORAL | Status: AC | PRN
  Administered 2023-09-14 – 2023-09-16 (×3): 1 mg via ORAL
  Administered 2023-09-17 (×3): 2 mg via ORAL
  Filled 2023-09-14: qty 2
  Filled 2023-09-14 (×2): qty 1
  Filled 2023-09-14: qty 2
  Filled 2023-09-14: qty 1
  Filled 2023-09-14: qty 2

## 2023-09-14 MED ORDER — ADULT MULTIVITAMIN W/MINERALS CH
1.0000 | ORAL_TABLET | Freq: Every day | ORAL | Status: DC
  Administered 2023-09-14 – 2023-09-20 (×7): 1 via ORAL
  Filled 2023-09-14 (×7): qty 1

## 2023-09-14 MED ORDER — LORAZEPAM 2 MG/ML IJ SOLN: 1.0000 mg | INTRAMUSCULAR | Status: AC | PRN

## 2023-09-14 MED ORDER — PANTOPRAZOLE SODIUM 40 MG PO TBEC
40.0000 mg | DELAYED_RELEASE_TABLET | Freq: Two times a day (BID) | ORAL | Status: DC
  Administered 2023-09-14 – 2023-09-15 (×3): 40 mg via ORAL
  Filled 2023-09-14 (×3): qty 1

## 2023-09-14 MED ORDER — THIAMINE MONONITRATE 100 MG PO TABS
100.0000 mg | ORAL_TABLET | Freq: Every day | ORAL | Status: DC
  Administered 2023-09-14 – 2023-09-20 (×7): 100 mg via ORAL
  Filled 2023-09-14 (×7): qty 1

## 2023-09-14 MED ORDER — POTASSIUM CHLORIDE CRYS ER 20 MEQ PO TBCR
40.0000 meq | EXTENDED_RELEASE_TABLET | Freq: Once | ORAL | Status: AC
  Administered 2023-09-14: 40 meq via ORAL
  Filled 2023-09-14: qty 2

## 2023-09-14 MED ORDER — THIAMINE HCL 100 MG/ML IJ SOLN
100.0000 mg | Freq: Every day | INTRAMUSCULAR | Status: DC
  Filled 2023-09-14 (×2): qty 2

## 2023-09-14 NOTE — Consult Note (Addendum)
 Referring Provider: EDP Primary Care Physician:  Robert Cole, MD Primary Gastroenterologist:  Dr. Wilhelmenia  Reason for Consultation: Falls at home, ? GI bleed   Attending physician's note  I personally saw the patient and performed a substantive portion of the medical decision making process for this encounter (including a complete performance of the key components : MDM, Hx and Exam), in conjunction with the APP.  I agree with the APP's note, impression, and  the management plan for the number and complexity of problems addressed at the encounter for the patient and take responsibility for that plan with its inherent risk of complications, morbidity, or mortality with additional input as follows.     History of Present Illness The patient, with liver cirrhosis and esophageal varices, presents with gastrointestinal symptoms.  Gastrointestinal symptoms - Stomach pain occurred yesterday and subsequently resolved - Nausea followed the episode of stomach pain - No vomiting, but sensation of gagging present - No hematemesis, melena, or blood in stool  Alcohol use - Consuming a fifth of alcohol per day for several days. 188 Alcohol level on admission. - Alcohol use has strained relationship with his children; no contact with them for two to three days  Fall and trauma - Experienced a fall at home resulting in minor injuries and bleeding - Incident involved tearing cabinets off the wall during a fall in the bathroom - Fire department responded and advised hospital evaluation  Medication adherence and side effects - Currently taking prescribed medications but missed doses two days ago - On lactulose  for ammonia management and prevention of confusion - Frequent bowel movements, up to three to four times daily, attributed to lactulose  use  History of liver cirrhosis and esophageal varices - Diagnosed with liver cirrhosis and esophageal varices - Previous endoscopic banding performed for  varices - Follow-up endoscopy was scheduled but canceled  Physical Exam GENERAL: Alert, cooperative, well developed, no acute distress HEENT: Normocephalic, normal oropharynx, moist mucous membranes CHEST: Clear to auscultation bilaterally, no wheezes, rhonchi, or crackles CARDIOVASCULAR: Normal heart rate and rhythm, S1 and S2 normal without murmurs ABDOMEN: Soft, non-tender, distended, normal bowel sounds EXTREMITIES: No cyanosis or edema. 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 No current evidence of gastrointestinal bleeding. Previous band ligation of varices. Portal hypertension likely contributing to varices. Lactulose  prescribed to manage hepatic encephalopathy by reducing ammonia levels. Reports abdominal pain, likely due to gas cramps from lactulose . - Perform upper endoscopy tomorrow to assess esophageal varices. - Instruct to take lactulose  to achieve three bowel movements daily. - Allow a diet that is easy on the stomach.   Alcohol use disorder Chronic alcohol use disorder with recent excessive intake, consuming up to a fifth of alcohol daily. Alcohol use exacerbates liver disease and increases risk of variceal bleeding and hepatic encephalopathy. Acknowledges the need to quit alcohol to prevent further health deterioration and is willing to stop drinking. - Advise complete cessation of alcohol consumption. Monitor for alcohol withdrawal  Recent fall Recent fall resulting in injuries, including bleeding and damage to home fixtures.  Nausea and abdominal pain Intermittent nausea and abdominal pain, possibly related to liver disease and medication side effects. No vomiting or blood in stool reported.  Abdominal pain may be due to gas cramps from lactulose   use.   The patient was provided an opportunity to ask questions and all were answered. The patient agreed with the plan and demonstrated an understanding of the instructions.  Robert Greene , MD [phone]     HPI: Robert Greene is a 63 y.o. male a pmh significant for rheumatoid arthritis, CAD, hypertension, hyperlipidemia, PIF/interstitial lung disease, nephrolithiasis, alcoholic cirrhosis (complicated by portal hypertension manifested as esophageal varices and perihepatic ascites), continued alcohol use disorder.  Patient admitted to hospital because of having falls at home.  He has had hemodynamically stable.  Hemoglobin is 8.5 g down from 9.0 g when last checked on 7/14.  He denies any sign of GI bleeding, stools today were yellow.  Per ED report there was no stool in the rectal vault, but mucus from rectal exam was heme-negative.  BUN is normal.  Alcohol levels 188 today so I am assuming that is why he has been falling, not because of a GI bleed.  Had EGD, colonoscopy, and video capsule endoscopy all within the past month as below.  He reports diarrhea at home today.  He tells me is not taking any liquid syrup type medication some I am assuming he is not taking his lactulose .   He was due for repeat EGD earlier this week as an outpatient to follow-up on/re-evaluated in regards to varices banding, but has had some insurance issues and it had to be canceled.  He was told that he could no longer follow-up with our practice as an outpatient because we are out of network or something.  He told me that he had some vodka 2 days ago, on Wednesday, but his alcohol level is 188 here in the ED today.  EGD 07/2023:  - No gross lesions in the proximal esophagus and in the mid esophagus. - Grade I, grade II and grade III esophageal varices with no bleeding and evidence of red wale signs on grade 3 varices. Banded. Completely eradicated. - Z-  line irregular, 41 cm from the incisors. - 1 cm hiatal hernia. - Portal hypertensive gastropathy. - Erythematous mucosa in the stomach. Biopsied. - Subepithelial nodule found in duodenum to. - No other gross lesions in the duodenal bulb, in the first portion of the duodenum and in the second portion of the duodenum.  A. RANDOM GASTRIC BIOPSY:  - Gastric antral mucosa with nonspecific reactive gastropathy  - Gastric oxyntic mucosa with parietal cell hyperplasia as can be seen  in hypergastrinemic states such as PPI therapy.  - Helicobacter pylori-like organisms are not identified on routine HE  stain   Colonoscopy 08/2023:  - Hemorrhoids found on digital rectal exam. - There was significant looping of the colon with redundancy. - Hematin in the entire examined colon. Lavaged copiously. - A single mildly oozing colonic spot versus AVM. Treated with argon plasma coagulation ( APC) . - Multiple 1 to 5 mm polyps at the recto- sigmoid colon, in the sigmoid colon and in the descending colon. Resection not attempted. - Rectal varix is small. - Normal mucosa in the entire examined colon. Could not visualize any evidence of diverticulosis. - Non- bleeding non- thrombosed external and internal hemorrhoids. - The entire colon was run 3 times without any evidence of further active bleeding being found after ablation of the spot/ AVM.   08/2023:  Complete VCE with adequate preparation. Portal gastropathy noted in stomach. Some mild edema and blunting of villi in the duodenum, possible related to portal hypertension but no other abnormalities noted  in the rest of the small bowel. No evidence of small bowel findings to relay etiology for iron deficiency anemia.   Past Medical History:  Diagnosis Date   Alcoholism (HCC)    Anxiety    Arthritis    rheumatoid   Cirrhosis (HCC)    Coronary artery calcification seen on CAT scan 04/08/2018   Dyspnea    HTN (hypertension) 04/08/2018   Hypercholesteremia     Hypertension    Interstitial lung disease (HCC)    Kidney stones 2020   Pulmonary fibrosis [facility])     Past Surgical History:  Procedure Laterality Date   COLONOSCOPY N/A 08/03/2023   Procedure: COLONOSCOPY;  Surgeon: Robert Greene Aloha Raddle., MD;  Location: [facility];  Service: Gastroenterology;  Laterality: N/A;   ESOPHAGOGASTRODUODENOSCOPY N/A 08/03/2023   Procedure: EGD (ESOPHAGOGASTRODUODENOSCOPY);  Surgeon: Robert Greene Aloha Raddle., MD;  Location: [facility];  Service: Gastroenterology;  Laterality: N/A;   KNEE CARTILAGE SURGERY Left    x2   LUNG BIOPSY Right 12/06/2017   Procedure: LUNG BIOPSY;  Surgeon: Kerrin Elspeth BROCKS, MD;  Location: [facility] OR;  Service: Thoracic;  Laterality: Right;   VIDEO ASSISTED THORACOSCOPY Right 12/06/2017   Procedure: VIDEO ASSISTED THORACOSCOPY;  Surgeon: Kerrin Elspeth BROCKS, MD;  Location: [facility] OR;  Service: Thoracic;  Laterality: Right;    Prior to Admission medications   Medication Sig Start Date End Date Taking? Authorizing Provider  EPINEPHrine  0.3 mg/0.3 mL IJ SOAJ injection Use once.  If ineffective, use 2nd dose. Patient taking differently: Inject 0.3 mg into the muscle as needed for anaphylaxis. 11/19/16   Dean Clarity, MD  etanercept (ENBREL) 50 MG/ML injection Inject 50 mg into the skin once a week. Saturday    [provider]  furosemide  (LASIX ) 20 MG tablet Take 1 tablet (20 mg total) by mouth daily as needed for fluid or edema. 08/06/23 09/05/23  Arlice Reichert, MD  hydroxychloroquine  (PLAQUENIL ) 200 MG tablet Take 200 mg by mouth daily. 02/06/23   [provider]  lactulose  (CHRONULAC ) 10 GM/15ML solution Take 30 mLs (20 g total) by mouth daily as needed for mild constipation (Target 2-3 bowel movements a day to flush out ammonia). Patient not taking: Reported on 08/29/2023 08/06/23   Arlice Reichert, MD  nadolol  (CORGARD ) 20 MG tablet Take 1 tablet (20 mg total) by mouth daily. 08/07/23 11/05/23  Arlice Reichert, MD  Omega-3  Fatty Acids (FISH OIL PO) Take 1 capsule by mouth 2 (two) times daily.    [provider]  pantoprazole  (PROTONIX ) 40 MG tablet Take 1 tablet (40 mg total) by mouth 2 (two) times daily. 08/06/23 11/04/23  Arlice Reichert, MD  sucralfate  (CARAFATE ) 1 GM/10ML suspension Take 10 mLs (1 g total) by mouth 2 (two) times daily for 14 days. Patient not taking: Reported on 08/29/2023 08/06/23 08/20/23  Arlice Reichert, MD  triamcinolone  cream (KENALOG) 0.1 % Apply 1 Application topically 2 (two) times daily. 07/25/23   [provider]    No current facility-administered medications for this encounter.   Current Outpatient Medications  Medication Sig Dispense Refill   EPINEPHrine  0.3 mg/0.3 mL IJ SOAJ injection Use once.  If ineffective, use 2nd dose. (Patient taking differently: Inject 0.3 mg into the muscle as needed for anaphylaxis.) 2 Device 0   etanercept (ENBREL) 50 MG/ML injection Inject 50 mg into the skin once a week. Saturday     furosemide  (LASIX ) 20 MG tablet Take 1 tablet (20 mg total) by mouth daily as needed for fluid or edema. 30  tablet 0   hydroxychloroquine  (PLAQUENIL ) 200 MG tablet Take 200 mg by mouth daily.     lactulose  (CHRONULAC ) 10 GM/15ML solution Take 30 mLs (20 g total) by mouth daily as needed for mild constipation (Target 2-3 bowel movements a day to flush out ammonia). (Patient not taking: Reported on 08/29/2023) 236 mL 0   nadolol  (CORGARD ) 20 MG tablet Take 1 tablet (20 mg total) by mouth daily. 90 tablet 0   Omega-3 Fatty Acids (FISH OIL PO) Take 1 capsule by mouth 2 (two) times daily.     pantoprazole  (PROTONIX ) 40 MG tablet Take 1 tablet (40 mg total) by mouth 2 (two) times daily. 60 tablet 2   sucralfate  (CARAFATE ) 1 GM/10ML suspension Take 10 mLs (1 g total) by mouth 2 (two) times daily for 14 days. (Patient not taking: Reported on 08/29/2023) 280 mL 0   triamcinolone  cream (KENALOG) 0.1 % Apply 1 Application topically 2 (two) times daily.      Allergies as of  09/14/2023 - Review Complete 09/14/2023  Allergen Reaction Noted   Bee venom Anaphylaxis, Hives, and Rash 07/25/2013   Spider antivenin [s black widow (latrodec mactans) antivenin] Anaphylaxis, Hives, and Rash 05/12/2013    Family History  Problem Relation Age of Onset   Cirrhosis Father    Colon cancer Neg Hx    Stomach cancer Neg Hx    Esophageal cancer Neg Hx    Inflammatory bowel disease Neg Hx    Liver disease Neg Hx    Pancreatic cancer Neg Hx    Rectal cancer Neg Hx     Social History   Socioeconomic History   Marital status: Widowed    Spouse name: Not on file   Number of children: 2   Years of education: Not on file   Highest education level: Not on file  Occupational History   Not on file  Tobacco Use   Smoking status: Every Day    Current packs/day: 1.50    Average packs/day: 1.5 packs/day for 40.0 years (60.0 ttl pk-yrs)    Types: Cigarettes   Smokeless tobacco: Never   Tobacco comments:    Pt states he does smoke every now and then but not much. 02/14/22 ALS   Vaping Use   Vaping status: Every Day  Substance and Sexual Activity   Alcohol use: Not Currently   Drug use: No   Sexual activity: Yes    Birth control/protection: None  Other Topics Concern   Not on file  Social History Narrative   Not on file   Social Drivers of Health   Financial Resource Strain: Not on file  Food Insecurity: No Food Insecurity (08/01/2023)   Hunger Vital Sign    Worried About Running Out of Food in the Last Year: Never true    Ran Out of Food in the Last Year: Never true  Transportation Needs: No Transportation Needs (08/01/2023)   PRAPARE - Administrator, Civil Service (Medical): No    Lack of Transportation (Non-Medical): No  Physical Activity: Not on file  Stress: Not on file  Social Connections: Not on file  Intimate Partner Violence: Not At Risk (08/01/2023)   Humiliation, Afraid, Rape, and Kick questionnaire    Fear of Current or Ex-Partner: No     Emotionally Abused: No    Physically Abused: No    Sexually Abused: No    Review of Systems: ROS is O/W negative except as mentioned HPI in HPI  Physical Exam: Vital  signs in last 24 hours: Temp:  [98 F (36.7 C)-99.2 F (37.3 C)] 98 F (36.7 C) (08/22 1535) Pulse Rate:  [87-101] 101 (08/22 1535) Resp:  [18-19] 18 (08/22 1535) BP: (137-146)/(72-81) 137/72 (08/22 1535) SpO2:  [98 %-100 %] 98 % (08/22 1535) Weight:  [90.7 kg] 90.7 kg (08/22 1157)   General:  Alert, Well-developed, well-nourished, pleasant and cooperative in NAD Head:  Normocephalic and atraumatic. Eyes:  Sclera clear, no icterus.  Conjunctiva pink. Ears:  Normal auditory acuity. Mouth:  No deformity or lesions.   Lungs:  Clear throughout to auscultation.  No wheezes, crackles, or rhonchi.  Heart:  Regular rate and rhythm; no murmurs, clicks, rubs, or gallops. Abdomen:  Soft, non-distended.  BS present.  Minimal epigastric TTP.   Rectal:  Heme negative per EDP. Msk:  Symmetrical without gross deformities. Pulses:  Normal pulses noted. Extremities:  Without clubbing or edema. Neurologic:  Alert and oriented x 4;  grossly normal neurologically. Skin:  Intact without significant lesions or rashes. Psych:  Alert and cooperative. Normal mood and affect.  Lab Results: Recent Labs    09/14/23 1225  WBC 2.9*  HGB 8.5*  HCT 27.7*  PLT 43*   BMET Recent Labs    09/14/23 1225  NA 141  K 3.2*  CL 109  CO2 21*  GLUCOSE 100*  BUN 7*  CREATININE 0.64  CALCIUM  8.4*   LFT Recent Labs    09/14/23 1225  PROT 6.9  ALBUMIN  3.3*  AST 93*  ALT 34  ALKPHOS 111  BILITOT 1.4*    Studies/Results: DG Chest Port 1 View Result Date: 09/14/2023 CLINICAL DATA:  Weakness, decreased vision of right eye. EXAM: PORTABLE CHEST 1 VIEW COMPARISON:  February 27, 2023. FINDINGS: The heart size and mediastinal contours are within normal limits. Left lung is clear. Stable right basilar opacity is noted most consistent with  scarring. The visualized skeletal structures are unremarkable. IMPRESSION: Stable right basilar scarring. No acute abnormality seen. Electronically Signed   By: Lynwood Landy Raddle M.D.   On: 09/14/2023 15:03   CT Head Wo Contrast Result Date: 09/14/2023 CLINICAL DATA:  Headache. Neurological deficit. Worsening falls. Visual disturbance. EXAM: CT HEAD WITHOUT CONTRAST TECHNIQUE: Contiguous axial images were obtained from the base of the skull through the vertex without intravenous contrast. RADIATION DOSE REDUCTION: This exam was performed according to the departmental dose-optimization program which includes automated exposure control, adjustment of the mA and/or kV according to patient size and/or use of iterative reconstruction technique. COMPARISON:  08/01/2023 FINDINGS: Brain: No sign of acute stroke, mass, hemorrhage, hydrocephalus or extra-axial collection. Vascular: There is atherosclerotic calcification of the major vessels at the base of the brain. Skull: Negative Sinuses/Orbits: Clear/normal Other: None IMPRESSION: No acute or reversible finding. Atherosclerotic calcification of the major vessels at the base of the brain. Electronically Signed   By: Oneil Officer M.D.   On: 09/14/2023 13:34    IMPRESSION:  Robert Greene is a 63 y.o. male with a pmh significant for rheumatoid arthritis, CAD, hypertension, hyperlipidemia, PIF/interstitial lung disease, nephrolithiasis, alcoholic cirrhosis (complicated by portal hypertension manifested as esophageal varices and perihepatic ascites), continued alcohol use disorder.  Patient admitted to hospital because of having falls at home.  He has had hemodynamically stable.  Hemoglobin is 8.5 g down from 9.0 g when last checked on 7/14.  He denies any sign of GI bleeding, stools today were yellow.  Per ED report there was no stool in the rectal vault, but mucus from  rectal exam was heme-negative.  BUN is normal.  Alcohol levels 188 today so I am assuming that is why he  has been falling, not because of a GI bleed.  Had EGD, colonoscopy, and video capsule endoscopy all within the past month.  Diarrhea: He reports diarrhea at home today.  He tells me is not taking any liquid syrup type medication some I am assuming he is not taking his lactulose .    PLAN: *Will allow him to have clear liquids today. *Will make him n.p.o. after midnight in case of EGD on 8/23.  At this point does not seem like he has GI bleeding and the cause of his falls would be more of alcohol intoxication.  At this point the only indication to do the endoscopy would be because he was due for that earlier this week as an outpatient, but has had some insurance issues and it had to be canceled.  He was told that he could no longer follow-up with our practice as an outpatient because we are out of network or something.  Certainly would need to be considered as well if he has any overt bleeding or continues to drop his hemoglobin *Monitor closely for withdrawals as he has had these before.  Needs CIWA protocol. *If diarrhea continues then consider stool studies. *Trend labs.   Robert Greene  09/14/2023, 4:16 PM

## 2023-09-14 NOTE — H&P (Addendum)
 History and Physical    [name] FMW:[id number] DOB: Aug 16, 1960 DOA: 09/14/2023  PCP: [name], MD Patient coming from: home   I have personally briefly reviewed patient's old medical records in [facility]  Chief Complaint: weakness, falls  HPI: Robert Greene is a 63 y.o. 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 Denies eye pain or acute vision changes. He reports had diarrhea today, denies blood in stool, reports it is yellow, no n/v. No fever, reports chronic epigastric dull pain, no new pain.    ED Course:   Data reviewed: Blood pressure 137/72, pulse (!) 101, temperature 98 F (36.7 C), temperature source Oral, resp. rate 18, height 5' 9 (1.753 m), weight 90.7 kg, SpO2 98%.  GI consulted who recommend will consult and hospitalist to admit.   Medications Ordered in the ED - No data to display    Review of Systems: As per HPI otherwise all other systems reviewed and are negative.   Past Medical History:  Diagnosis Date   Alcoholism (HCC)    Anxiety    Arthritis    rheumatoid   Cirrhosis (HCC)    Coronary artery calcification seen on CAT scan 04/08/2018   Dyspnea    HTN (hypertension) 04/08/2018   Hypercholesteremia    Hypertension    Interstitial lung disease (HCC)    Kidney stones 2020   Pulmonary fibrosis [facility])     Past Surgical History:  Procedure Laterality Date   COLONOSCOPY N/A 08/03/2023   Procedure: COLONOSCOPY;  Surgeon: Wilhelmenia Aloha Raddle., MD;  Location: [facility];  Service: Gastroenterology;   Laterality: N/A;   ESOPHAGOGASTRODUODENOSCOPY N/A 08/03/2023   Procedure: EGD (ESOPHAGOGASTRODUODENOSCOPY);  Surgeon: Wilhelmenia Aloha Raddle., MD;  Location: [facility];  Service: Gastroenterology;  Laterality: N/A;   KNEE CARTILAGE SURGERY Left    x2   LUNG BIOPSY Right 12/06/2017   Procedure: LUNG BIOPSY;  Surgeon: Kerrin Elspeth BROCKS, MD;  Location: [facility] OR;  Service: Thoracic;  Laterality: Right;   VIDEO ASSISTED THORACOSCOPY Right 12/06/2017   Procedure: VIDEO ASSISTED THORACOSCOPY;  Surgeon: Kerrin Elspeth BROCKS, MD;  Location: [facility] OR;  Service: Thoracic;  Laterality: Right;    Social History  reports that he has been smoking cigarettes. He has a 60 pack-year smoking history. He has never used smokeless tobacco. He reports that he does not currently use alcohol. He reports that he does not use drugs.  Allergies  Allergen Reactions   Bee Venom Anaphylaxis, Hives and Rash   Spider Antivenin [S Black Widow (Latrodec Mactans) Antivenin] Anaphylaxis, Hives and Rash    Family History  Problem Relation Age of Onset   Cirrhosis Father    Colon cancer Neg Hx    Stomach cancer Neg Hx    Esophageal cancer Neg Hx    Inflammatory bowel disease Neg Hx    Liver disease Neg Hx    Pancreatic cancer Neg Hx    Rectal cancer Neg Hx     Prior to Admission medications   Medication Sig Start Date End Date Taking? Authorizing Provider  EPINEPHrine  0.3 mg/0.3 mL IJ SOAJ injection Use once.  If  ineffective, use 2nd dose. Patient taking differently: Inject 0.3 mg into the muscle as needed for anaphylaxis. 11/19/16   Dean Clarity, MD  etanercept (ENBREL) 50 MG/ML injection Inject 50 mg into the skin once a week. Saturday    [provider]  furosemide  (LASIX ) 20 MG tablet Take 1 tablet (20 mg total) by mouth daily as needed for fluid or edema. 08/06/23 09/05/23  Arlice Reichert, MD  hydroxychloroquine  (PLAQUENIL ) 200 MG tablet Take 200 mg by mouth daily. 02/06/23   [provider]   lactulose  (CHRONULAC ) 10 GM/15ML solution Take 30 mLs (20 g total) by mouth daily as needed for mild constipation (Target 2-3 bowel movements a day to flush out ammonia). Patient not taking: Reported on 08/29/2023 08/06/23   Arlice Reichert, MD  nadolol  (CORGARD ) 20 MG tablet Take 1 tablet (20 mg total) by mouth daily. 08/07/23 11/05/23  Arlice Reichert, MD  Omega-3 Fatty Acids (FISH OIL PO) Take 1 capsule by mouth 2 (two) times daily.    [provider]  pantoprazole  (PROTONIX ) 40 MG tablet Take 1 tablet (40 mg total) by mouth 2 (two) times daily. 08/06/23 11/04/23  Arlice Reichert, MD  sucralfate  (CARAFATE ) 1 GM/10ML suspension Take 10 mLs (1 g total) by mouth 2 (two) times daily for 14 days. Patient not taking: Reported on 08/29/2023 08/06/23 08/20/23  Arlice Reichert, MD  triamcinolone  cream (KENALOG) 0.1 % Apply 1 Application topically 2 (two) times daily. 07/25/23   [provider]    Physical Exam: Vitals:   09/14/23 1152 09/14/23 1157 09/14/23 1535  BP: (!) 146/81  137/72  Pulse: 87  (!) 101  Resp: 19  18  Temp: 99.2 F (37.3 C)  98 F (36.7 C)  TempSrc: Oral  Oral  SpO2: 100%  98%  Weight:  90.7 kg   Height:  5' 9 (1.753 m)     Constitutional: chronically ill appearing, no acute distress,  Eyes: right eye ptosis, PERRL, lids and conjunctivae normal ENMT: Mucous membranes are moist.  Respiratory: clear to auscultation bilaterally, no wheezing, no crackles. Normal respiratory effort. No accessory muscle use.  Cardiovascular: Regular rate and rhythm,  No extremity edema. 2+ pedal pulses. No carotid bruits.  Abdomen: mild epigstric tenderness, no rebound, not distended, Bowel sounds positive.  Musculoskeletal: no clubbing / cyanosis. No joint deformity upper and lower extremities. Good ROM, no contractures. Normal muscle tone.  Skin: no rashes, lesions, ulcers. No induration Neurologic: mild RUE tremor,  Mild impaired memory  Psychiatric: Normal judgment and insight. Alert  and oriented x 3. Normal mood.    Labs on Admission: I have personally reviewed following labs and imaging studies  CBC: Recent Labs  Lab 09/14/23 1225  WBC 2.9*  HGB 8.5*  HCT 27.7*  MCV 82.9  PLT 43*    Basic Metabolic Panel: Recent Labs  Lab 09/14/23 1225  NA 141  K 3.2*  CL 109  CO2 21*  GLUCOSE 100*  BUN 7*  CREATININE 0.64  CALCIUM  8.4*    GFR: Estimated Creatinine Clearance: 105.2 mL/min (by C-G formula based on SCr of 0.64 mg/dL).  Liver Function Tests: Recent Labs  Lab 09/14/23 1225  AST 93*  ALT 34  ALKPHOS 111  BILITOT 1.4*  PROT 6.9  ALBUMIN  3.3*    Urine analysis:    Component Value Date/Time   COLORURINE YELLOW 09/14/2023 1612   APPEARANCEUR CLEAR 09/14/2023 1612   LABSPEC 1.015 09/14/2023 1612   PHURINE 6.0 09/14/2023 1612   GLUCOSEU NEGATIVE 09/14/2023 1612  HGBUR NEGATIVE 09/14/2023 1612   BILIRUBINUR NEGATIVE 09/14/2023 1612   BILIRUBINUR negative 09/16/2019 1114   KETONESUR NEGATIVE 09/14/2023 1612   PROTEINUR NEGATIVE 09/14/2023 1612   UROBILINOGEN 0.2 09/16/2019 1114   UROBILINOGEN 1.0 06/10/2009 1916   NITRITE NEGATIVE 09/14/2023 1612   LEUKOCYTESUR NEGATIVE 09/14/2023 1612    Radiological Exams on Admission: DG Chest Port 1 View Result Date: 09/14/2023 CLINICAL DATA:  Weakness, decreased vision of right eye. EXAM: PORTABLE CHEST 1 VIEW COMPARISON:  February 27, 2023. FINDINGS: The heart size and mediastinal contours are within normal limits. Left lung is clear. Stable right basilar opacity is noted most consistent with scarring. The visualized skeletal structures are unremarkable. IMPRESSION: Stable right basilar scarring. No acute abnormality seen. Electronically Signed   By: Lynwood Landy Raddle M.D.   On: 09/14/2023 15:03   CT Head Wo Contrast Result Date: 09/14/2023 CLINICAL DATA:  Headache. Neurological deficit. Worsening falls. Visual disturbance. EXAM: CT HEAD WITHOUT CONTRAST TECHNIQUE: Contiguous axial images were  obtained from the base of the skull through the vertex without intravenous contrast. RADIATION DOSE REDUCTION: This exam was performed according to the departmental dose-optimization program which includes automated exposure control, adjustment of the mA and/or kV according to patient size and/or use of iterative reconstruction technique. COMPARISON:  08/01/2023 FINDINGS: Brain: No sign of acute stroke, mass, hemorrhage, hydrocephalus or extra-axial collection. Vascular: There is atherosclerotic calcification of the major vessels at the base of the brain. Skull: Negative Sinuses/Orbits: Clear/normal Other: None IMPRESSION: No acute or reversible finding. Atherosclerotic calcification of the major vessels at the base of the brain. Electronically Signed   By: Oneil Officer M.D.   On: 09/14/2023 13:34      Assessment/Plan Active Problems:   * No active hospital problems. *   Generalized Weakness and falls Likely from continued alcohol use Fall precaution, PT eval  Right eye ptosis  Check mri orbit, mri brain  AChR  Hypokalemia, replace k  Acute on chronic anemia Currently on overt bleeding Monitor cbc, GI on board, possible EGD in am   alcoholic liver cirrhosis with esophageal varices,  Thrombocytopenia,  Coagulopathy INR 1.3 Supportive measures. 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 Please contact the author with questions and/or clarifications.  Ileana Cummins MD  PhD FACP [facility]  How to contact the [facility] Attending or Consulting provider 7A - 7P or covering provider during after hours 7P -7A, for this patient?   Check the care team in [facility] and look for a) attending/consulting [facility] provider listed and b) the [facility] team listed Log into [email or web address] and use [facility]'s universal password to access. If you do not have the password, please contact the hospital operator. Locate the [facility] provider you are looking for under [facility] and page to a number that you can be directly reached. If you still have difficulty reaching the provider, please page the [facility] (Director on Call) for the Hospitalists listed on amion for assistance.  09/14/2023, 4:19 PM

## 2023-09-14 NOTE — ED Triage Notes (Addendum)
 Patient BIB [facility] from home, lives alone. Called out due to diarrhea, increased falls over the last 2 weeks. Knocked the shelf over in his bathroom today. Change in vision in right eye began 2 days ago. Tremors in both arms along with finger tip numbness. Feels weak all over.   EMS 18G left hand LR

## 2023-09-14 NOTE — H&P (View-Only) (Signed)
 Referring Provider: EDP Primary Care Physician:  [name], MD Primary Gastroenterologist:  Dr. Wilhelmenia  Reason for Consultation: Falls at home, ? GI bleed   Attending physician's note  I personally saw the patient and performed a substantive portion of the medical decision making process for this encounter (including a complete performance of the key components : MDM, Hx and Exam), in conjunction with the APP.  I agree with the APP's note, impression, and  the management plan for the number and complexity of problems addressed at the encounter for the patient and take responsibility for that plan with its inherent risk of complications, morbidity, or mortality with additional input as follows.     History of Present Illness The patient, with liver cirrhosis and esophageal varices, presents with gastrointestinal symptoms.  Gastrointestinal symptoms - Stomach pain occurred yesterday and subsequently resolved - Nausea followed the episode of stomach pain - No vomiting, but sensation of gagging present - No hematemesis, melena, or blood in stool  Alcohol use - Consuming a fifth of alcohol per day for several days. 188 Alcohol level on admission. - Alcohol use has strained relationship with his children; no contact with them for two to three days  Fall and trauma - Experienced a fall at home resulting in minor injuries and bleeding - Incident involved tearing cabinets off the wall during a fall in the bathroom - Fire department responded and advised hospital evaluation  Medication adherence and side effects - Currently taking prescribed medications but missed doses two days ago - On lactulose  for ammonia management and prevention of confusion - Frequent bowel movements, up to three to four times daily, attributed to lactulose  use  History of liver cirrhosis and esophageal varices - Diagnosed with liver cirrhosis and esophageal varices - Previous endoscopic banding performed for  varices - Follow-up endoscopy was scheduled but canceled  Physical Exam GENERAL: Alert, cooperative, well developed, no acute distress HEENT: Normocephalic, normal oropharynx, moist mucous membranes CHEST: Clear to auscultation bilaterally, no wheezes, rhonchi, or crackles CARDIOVASCULAR: Normal heart rate and rhythm, S1 and S2 normal without murmurs ABDOMEN: Soft, non-tender, distended, normal bowel sounds EXTREMITIES: No cyanosis or edema. 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 No current evidence of gastrointestinal bleeding. Previous band ligation of varices. Portal hypertension likely contributing to varices. Lactulose  prescribed to manage hepatic encephalopathy by reducing ammonia levels. Reports abdominal pain, likely due to gas cramps from lactulose . - Perform upper endoscopy tomorrow to assess esophageal varices. - Instruct to take lactulose  to achieve three bowel movements daily. - Allow a diet that is easy on the stomach.   Alcohol use disorder Chronic alcohol use disorder with recent excessive intake, consuming up to a fifth of alcohol daily. Alcohol use exacerbates liver disease and increases risk of variceal bleeding and hepatic encephalopathy. Acknowledges the need to quit alcohol to prevent further health deterioration and is willing to stop drinking. - Advise complete cessation of alcohol consumption. Monitor for alcohol withdrawal  Recent fall Recent fall resulting in injuries, including bleeding and damage to home fixtures.  Nausea and abdominal pain Intermittent nausea and abdominal pain, possibly related to liver disease and medication side effects. No vomiting or blood in stool reported.  Abdominal pain may be due to gas cramps from lactulose   use.   The patient was provided an opportunity to ask questions and all were answered. The patient agreed with the plan and demonstrated an understanding of the instructions.  Robert Greene , MD [phone]     HPI: Robert Greene is a 63 y.o. male a pmh significant for rheumatoid arthritis, CAD, hypertension, hyperlipidemia, PIF/interstitial lung disease, nephrolithiasis, alcoholic cirrhosis (complicated by portal hypertension manifested as esophageal varices and perihepatic ascites), continued alcohol use disorder.  Patient admitted to hospital because of having falls at home.  He has had hemodynamically stable.  Hemoglobin is 8.5 g down from 9.0 g when last checked on 7/14.  He denies any sign of GI bleeding, stools today were yellow.  Per ED report there was no stool in the rectal vault, but mucus from rectal exam was heme-negative.  BUN is normal.  Alcohol levels 188 today so I am assuming that is why he has been falling, not because of a GI bleed.  Had EGD, colonoscopy, and video capsule endoscopy all within the past month as below.  He reports diarrhea at home today.  He tells me is not taking any liquid syrup type medication some I am assuming he is not taking his lactulose .   He was due for repeat EGD earlier this week as an outpatient to follow-up on/re-evaluated in regards to varices banding, but has had some insurance issues and it had to be canceled.  He was told that he could no longer follow-up with our practice as an outpatient because we are out of network or something.  He told me that he had some vodka 2 days ago, on Wednesday, but his alcohol level is 188 here in the ED today.  EGD 07/2023:  - No gross lesions in the proximal esophagus and in the mid esophagus. - Grade I, grade II and grade III esophageal varices with no bleeding and evidence of red wale signs on grade 3 varices. Banded. Completely eradicated. - Z-  line irregular, 41 cm from the incisors. - 1 cm hiatal hernia. - Portal hypertensive gastropathy. - Erythematous mucosa in the stomach. Biopsied. - Subepithelial nodule found in duodenum to. - No other gross lesions in the duodenal bulb, in the first portion of the duodenum and in the second portion of the duodenum.  A. RANDOM GASTRIC BIOPSY:  - Gastric antral mucosa with nonspecific reactive gastropathy  - Gastric oxyntic mucosa with parietal cell hyperplasia as can be seen  in hypergastrinemic states such as PPI therapy.  - Helicobacter pylori-like organisms are not identified on routine HE  stain   Colonoscopy 08/2023:  - Hemorrhoids found on digital rectal exam. - There was significant looping of the colon with redundancy. - Hematin in the entire examined colon. Lavaged copiously. - A single mildly oozing colonic spot versus AVM. Treated with argon plasma coagulation ( APC) . - Multiple 1 to 5 mm polyps at the recto- sigmoid colon, in the sigmoid colon and in the descending colon. Resection not attempted. - Rectal varix is small. - Normal mucosa in the entire examined colon. Could not visualize any evidence of diverticulosis. - Non- bleeding non- thrombosed external and internal hemorrhoids. - The entire colon was run 3 times without any evidence of further active bleeding being found after ablation of the spot/ AVM.   08/2023:  Complete VCE with adequate preparation. Portal gastropathy noted in stomach. Some mild edema and blunting of villi in the duodenum, possible related to portal hypertension but no other abnormalities noted  in the rest of the small bowel. No evidence of small bowel findings to relay etiology for iron deficiency anemia.   Past Medical History:  Diagnosis Date   Alcoholism (HCC)    Anxiety    Arthritis    rheumatoid   Cirrhosis (HCC)    Coronary artery calcification seen on CAT scan 04/08/2018   Dyspnea    HTN (hypertension) 04/08/2018   Hypercholesteremia     Hypertension    Interstitial lung disease (HCC)    Kidney stones 2020   Pulmonary fibrosis [facility])     Past Surgical History:  Procedure Laterality Date   COLONOSCOPY N/A 08/03/2023   Procedure: COLONOSCOPY;  Surgeon: Wilhelmenia Aloha Raddle., MD;  Location: [facility];  Service: Gastroenterology;  Laterality: N/A;   ESOPHAGOGASTRODUODENOSCOPY N/A 08/03/2023   Procedure: EGD (ESOPHAGOGASTRODUODENOSCOPY);  Surgeon: Wilhelmenia Aloha Raddle., MD;  Location: [facility];  Service: Gastroenterology;  Laterality: N/A;   KNEE CARTILAGE SURGERY Left    x2   LUNG BIOPSY Right 12/06/2017   Procedure: LUNG BIOPSY;  Surgeon: Kerrin Elspeth BROCKS, MD;  Location: [facility] OR;  Service: Thoracic;  Laterality: Right;   VIDEO ASSISTED THORACOSCOPY Right 12/06/2017   Procedure: VIDEO ASSISTED THORACOSCOPY;  Surgeon: Kerrin Elspeth BROCKS, MD;  Location: [facility] OR;  Service: Thoracic;  Laterality: Right;    Prior to Admission medications   Medication Sig Start Date End Date Taking? Authorizing Provider  EPINEPHrine  0.3 mg/0.3 mL IJ SOAJ injection Use once.  If ineffective, use 2nd dose. Patient taking differently: Inject 0.3 mg into the muscle as needed for anaphylaxis. 11/19/16   Dean Clarity, MD  etanercept (ENBREL) 50 MG/ML injection Inject 50 mg into the skin once a week. Saturday    [provider]  furosemide  (LASIX ) 20 MG tablet Take 1 tablet (20 mg total) by mouth daily as needed for fluid or edema. 08/06/23 09/05/23  Arlice Reichert, MD  hydroxychloroquine  (PLAQUENIL ) 200 MG tablet Take 200 mg by mouth daily. 02/06/23   [provider]  lactulose  (CHRONULAC ) 10 GM/15ML solution Take 30 mLs (20 g total) by mouth daily as needed for mild constipation (Target 2-3 bowel movements a day to flush out ammonia). Patient not taking: Reported on 08/29/2023 08/06/23   Arlice Reichert, MD  nadolol  (CORGARD ) 20 MG tablet Take 1 tablet (20 mg total) by mouth daily. 08/07/23 11/05/23  Arlice Reichert, MD  Omega-3  Fatty Acids (FISH OIL PO) Take 1 capsule by mouth 2 (two) times daily.    [provider]  pantoprazole  (PROTONIX ) 40 MG tablet Take 1 tablet (40 mg total) by mouth 2 (two) times daily. 08/06/23 11/04/23  Arlice Reichert, MD  sucralfate  (CARAFATE ) 1 GM/10ML suspension Take 10 mLs (1 g total) by mouth 2 (two) times daily for 14 days. Patient not taking: Reported on 08/29/2023 08/06/23 08/20/23  Arlice Reichert, MD  triamcinolone  cream (KENALOG) 0.1 % Apply 1 Application topically 2 (two) times daily. 07/25/23   [provider]    No current facility-administered medications for this encounter.   Current Outpatient Medications  Medication Sig Dispense Refill   EPINEPHrine  0.3 mg/0.3 mL IJ SOAJ injection Use once.  If ineffective, use 2nd dose. (Patient taking differently: Inject 0.3 mg into the muscle as needed for anaphylaxis.) 2 Device 0   etanercept (ENBREL) 50 MG/ML injection Inject 50 mg into the skin once a week. Saturday     furosemide  (LASIX ) 20 MG tablet Take 1 tablet (20 mg total) by mouth daily as needed for fluid or edema. 30  tablet 0   hydroxychloroquine  (PLAQUENIL ) 200 MG tablet Take 200 mg by mouth daily.     lactulose  (CHRONULAC ) 10 GM/15ML solution Take 30 mLs (20 g total) by mouth daily as needed for mild constipation (Target 2-3 bowel movements a day to flush out ammonia). (Patient not taking: Reported on 08/29/2023) 236 mL 0   nadolol  (CORGARD ) 20 MG tablet Take 1 tablet (20 mg total) by mouth daily. 90 tablet 0   Omega-3 Fatty Acids (FISH OIL PO) Take 1 capsule by mouth 2 (two) times daily.     pantoprazole  (PROTONIX ) 40 MG tablet Take 1 tablet (40 mg total) by mouth 2 (two) times daily. 60 tablet 2   sucralfate  (CARAFATE ) 1 GM/10ML suspension Take 10 mLs (1 g total) by mouth 2 (two) times daily for 14 days. (Patient not taking: Reported on 08/29/2023) 280 mL 0   triamcinolone  cream (KENALOG) 0.1 % Apply 1 Application topically 2 (two) times daily.      Allergies as of  09/14/2023 - Review Complete 09/14/2023  Allergen Reaction Noted   Bee venom Anaphylaxis, Hives, and Rash 07/25/2013   Spider antivenin [s black widow (latrodec mactans) antivenin] Anaphylaxis, Hives, and Rash 05/12/2013    Family History  Problem Relation Age of Onset   Cirrhosis Father    Colon cancer Neg Hx    Stomach cancer Neg Hx    Esophageal cancer Neg Hx    Inflammatory bowel disease Neg Hx    Liver disease Neg Hx    Pancreatic cancer Neg Hx    Rectal cancer Neg Hx     Social History   Socioeconomic History   Marital status: Widowed    Spouse name: Not on file   Number of children: 2   Years of education: Not on file   Highest education level: Not on file  Occupational History   Not on file  Tobacco Use   Smoking status: Every Day    Current packs/day: 1.50    Average packs/day: 1.5 packs/day for 40.0 years (60.0 ttl pk-yrs)    Types: Cigarettes   Smokeless tobacco: Never   Tobacco comments:    Pt states he does smoke every now and then but not much. 02/14/22 ALS   Vaping Use   Vaping status: Every Day  Substance and Sexual Activity   Alcohol use: Not Currently   Drug use: No   Sexual activity: Yes    Birth control/protection: None  Other Topics Concern   Not on file  Social History Narrative   Not on file   Social Drivers of Health   Financial Resource Strain: Not on file  Food Insecurity: No Food Insecurity (08/01/2023)   Hunger Vital Sign    Worried About Running Out of Food in the Last Year: Never true    Ran Out of Food in the Last Year: Never true  Transportation Needs: No Transportation Needs (08/01/2023)   PRAPARE - Administrator, Civil Service (Medical): No    Lack of Transportation (Non-Medical): No  Physical Activity: Not on file  Stress: Not on file  Social Connections: Not on file  Intimate Partner Violence: Not At Risk (08/01/2023)   Humiliation, Afraid, Rape, and Kick questionnaire    Fear of Current or Ex-Partner: No     Emotionally Abused: No    Physically Abused: No    Sexually Abused: No    Review of Systems: ROS is O/W negative except as mentioned HPI in HPI  Physical Exam: Vital  signs in last 24 hours: Temp:  [98 F (36.7 C)-99.2 F (37.3 C)] 98 F (36.7 C) (08/22 1535) Pulse Rate:  [87-101] 101 (08/22 1535) Resp:  [18-19] 18 (08/22 1535) BP: (137-146)/(72-81) 137/72 (08/22 1535) SpO2:  [98 %-100 %] 98 % (08/22 1535) Weight:  [90.7 kg] 90.7 kg (08/22 1157)   General:  Alert, Well-developed, well-nourished, pleasant and cooperative in NAD Head:  Normocephalic and atraumatic. Eyes:  Sclera clear, no icterus.  Conjunctiva pink. Ears:  Normal auditory acuity. Mouth:  No deformity or lesions.   Lungs:  Clear throughout to auscultation.  No wheezes, crackles, or rhonchi.  Heart:  Regular rate and rhythm; no murmurs, clicks, rubs, or gallops. Abdomen:  Soft, non-distended.  BS present.  Minimal epigastric TTP.   Rectal:  Heme negative per EDP. Msk:  Symmetrical without gross deformities. Pulses:  Normal pulses noted. Extremities:  Without clubbing or edema. Neurologic:  Alert and oriented x 4;  grossly normal neurologically. Skin:  Intact without significant lesions or rashes. Psych:  Alert and cooperative. Normal mood and affect.  Lab Results: Recent Labs    09/14/23 1225  WBC 2.9*  HGB 8.5*  HCT 27.7*  PLT 43*   BMET Recent Labs    09/14/23 1225  NA 141  K 3.2*  CL 109  CO2 21*  GLUCOSE 100*  BUN 7*  CREATININE 0.64  CALCIUM  8.4*   LFT Recent Labs    09/14/23 1225  PROT 6.9  ALBUMIN  3.3*  AST 93*  ALT 34  ALKPHOS 111  BILITOT 1.4*    Studies/Results: DG Chest Port 1 View Result Date: 09/14/2023 CLINICAL DATA:  Weakness, decreased vision of right eye. EXAM: PORTABLE CHEST 1 VIEW COMPARISON:  February 27, 2023. FINDINGS: The heart size and mediastinal contours are within normal limits. Left lung is clear. Stable right basilar opacity is noted most consistent with  scarring. The visualized skeletal structures are unremarkable. IMPRESSION: Stable right basilar scarring. No acute abnormality seen. Electronically Signed   By: Lynwood Landy Raddle M.D.   On: 09/14/2023 15:03   CT Head Wo Contrast Result Date: 09/14/2023 CLINICAL DATA:  Headache. Neurological deficit. Worsening falls. Visual disturbance. EXAM: CT HEAD WITHOUT CONTRAST TECHNIQUE: Contiguous axial images were obtained from the base of the skull through the vertex without intravenous contrast. RADIATION DOSE REDUCTION: This exam was performed according to the departmental dose-optimization program which includes automated exposure control, adjustment of the mA and/or kV according to patient size and/or use of iterative reconstruction technique. COMPARISON:  08/01/2023 FINDINGS: Brain: No sign of acute stroke, mass, hemorrhage, hydrocephalus or extra-axial collection. Vascular: There is atherosclerotic calcification of the major vessels at the base of the brain. Skull: Negative Sinuses/Orbits: Clear/normal Other: None IMPRESSION: No acute or reversible finding. Atherosclerotic calcification of the major vessels at the base of the brain. Electronically Signed   By: Oneil Officer M.D.   On: 09/14/2023 13:34    IMPRESSION:  Robert Greene is a 63 y.o. male with a pmh significant for rheumatoid arthritis, CAD, hypertension, hyperlipidemia, PIF/interstitial lung disease, nephrolithiasis, alcoholic cirrhosis (complicated by portal hypertension manifested as esophageal varices and perihepatic ascites), continued alcohol use disorder.  Patient admitted to hospital because of having falls at home.  He has had hemodynamically stable.  Hemoglobin is 8.5 g down from 9.0 g when last checked on 7/14.  He denies any sign of GI bleeding, stools today were yellow.  Per ED report there was no stool in the rectal vault, but mucus from  rectal exam was heme-negative.  BUN is normal.  Alcohol levels 188 today so I am assuming that is why he  has been falling, not because of a GI bleed.  Had EGD, colonoscopy, and video capsule endoscopy all within the past month.  Diarrhea: He reports diarrhea at home today.  He tells me is not taking any liquid syrup type medication some I am assuming he is not taking his lactulose .    PLAN: *Will allow him to have clear liquids today. *Will make him n.p.o. after midnight in case of EGD on 8/23.  At this point does not seem like he has GI bleeding and the cause of his falls would be more of alcohol intoxication.  At this point the only indication to do the endoscopy would be because he was due for that earlier this week as an outpatient, but has had some insurance issues and it had to be canceled.  He was told that he could no longer follow-up with our practice as an outpatient because we are out of network or something.  Certainly would need to be considered as well if he has any overt bleeding or continues to drop his hemoglobin *Monitor closely for withdrawals as he has had these before.  Needs CIWA protocol. *If diarrhea continues then consider stool studies. *Trend labs.   Robert Greene  09/14/2023, 4:16 PM

## 2023-09-14 NOTE — ED Provider Notes (Signed)
 [place] [facility] AT [facility] Provider Note   CSN: [id number] Arrival date & time: 09/14/23  1137     Patient presents with: Weakness   Robert Greene is a 63 y.o. male.   Patient complains of being weak.  Patient reports he has been having increasing weakness for the past 1 week.  Patient reports he has had multiple falls.  He states he has been drinking plenty of water he has not been eating very well.  Patient tells me that he has fallen multiple times because he is so weak.  Patient reports he knocked a shelf over in his bathroom today when he fell.  He states he scraped his arm but no other injury.  Patient has a past medical history of alcohol abuse and cirrhosis.  Patient was admitted in July for a GI bleed.  Patient had a colonoscopy endoscopy and capsule endoscopy.  Patient tells me that he has not seen any bleeding since he has been home.  He reports he did have an episode of diarrhea earlier today.  Patient has a past medical history of interstitial lung disease hypertension coronary artery disease, alcohol abuse, esophageal varices and GI bleed.  Patient is followed by  GI.  Patient also reports he has had some blurred vision in his right.  Patient feels like this started 2 days ago.  He denies any injury to his eye  The history is provided by the patient. No language interpreter was used.  Weakness      Prior to Admission medications   Medication Sig Start Date End Date Taking? Authorizing Provider  EPINEPHrine  0.3 mg/0.3 mL IJ SOAJ injection Use once.  If ineffective, use 2nd dose. Patient taking differently: Inject 0.3 mg into the muscle as needed for anaphylaxis. 11/19/16   Dean Clarity, MD  etanercept (ENBREL) 50 MG/ML injection Inject 50 mg into the skin once a week. Saturday    [provider]  furosemide  (LASIX ) 20 MG tablet Take 1 tablet (20 mg total) by mouth daily as needed for fluid or edema. 08/06/23 09/05/23  Arlice Reichert, MD  hydroxychloroquine  (PLAQUENIL ) 200 MG tablet Take 200 mg by mouth daily. 02/06/23   [provider]  lactulose  (CHRONULAC ) 10 GM/15ML solution Take 30 mLs (20 g total) by mouth daily as needed for mild constipation (Target 2-3 bowel movements a day to flush out ammonia). Patient not taking: Reported on 08/29/2023 08/06/23   Arlice Reichert, MD  nadolol  (CORGARD ) 20 MG tablet Take 1 tablet (20 mg total) by mouth daily. 08/07/23 11/05/23  Arlice Reichert, MD  Omega-3 Fatty Acids (FISH OIL PO) Take 1 capsule by mouth 2 (two) times daily.    [provider]  pantoprazole  (PROTONIX ) 40 MG tablet Take 1 tablet (40 mg total) by mouth 2 (two) times daily. 08/06/23 11/04/23  Arlice Reichert, MD  sucralfate  (CARAFATE ) 1 GM/10ML suspension Take 10 mLs (1 g total) by mouth 2 (two) times daily for 14 days. Patient not taking: Reported on 08/29/2023 08/06/23 08/20/23  Arlice Reichert, MD  triamcinolone  cream (KENALOG) 0.1 % Apply 1 Application topically 2 (two) times daily. 07/25/23   [provider]    Allergies: Bee venom and Spider antivenin [s black widow (latrodec mactans) antivenin]    Review of Systems  Eyes:  Positive for visual disturbance.  Neurological:  Positive for weakness.  All other systems reviewed and are negative.   Updated Vital Signs BP 137/72 (BP Location: Left Arm)   Pulse ROLLEN)  101   Temp 98 F (36.7 C) (Oral)   Resp 18   Ht 5' 9 (1.753 m)   Wt 90.7 kg   SpO2 98%   BMI 29.53 kg/m   Physical Exam Vitals and nursing note reviewed.  Constitutional:      Appearance: He is well-developed.  HENT:     Head: Normocephalic.     Right Ear: Tympanic membrane normal.     Left Ear: Tympanic membrane normal.     Nose: Nose normal.     Mouth/Throat:     Mouth: Mucous membranes are moist.  Eyes:     Extraocular Movements: Extraocular movements intact.     Pupils: Pupils are equal, round, and reactive to light.  Cardiovascular:     Rate and Rhythm: Normal  rate and regular rhythm.  Pulmonary:     Effort: Pulmonary effort is normal.  Abdominal:     General: There is no distension.  Musculoskeletal:        General: Normal range of motion.     Cervical back: Normal range of motion.  Skin:    General: Skin is warm.  Neurological:     General: No focal deficit present.     Mental Status: He is alert and oriented to person, place, and time.  Psychiatric:        Mood and Affect: Mood normal.        Behavior: Behavior normal.     (all labs ordered are listed, but only abnormal results are displayed) Labs Reviewed  COMPREHENSIVE METABOLIC PANEL WITH GFR - Abnormal; Notable for the following components:      Result Value   Potassium 3.2 (*)    CO2 21 (*)    Glucose, Bld 100 (*)    BUN 7 (*)    Calcium  8.4 (*)    Albumin  3.3 (*)    AST 93 (*)    Total Bilirubin 1.4 (*)    All other components within normal limits  CBC - Abnormal; Notable for the following components:   WBC 2.9 (*)    RBC 3.34 (*)    Hemoglobin 8.5 (*)    HCT 27.7 (*)    MCH 25.4 (*)    RDW 16.0 (*)    Platelets 43 (*)    All other components within normal limits  URINALYSIS, ROUTINE W REFLEX MICROSCOPIC  ETHANOL  AMMONIA  POC OCCULT BLOOD, ED    EKG: None  Radiology: [facility] Chest Port 1 View Result Date: 09/14/2023 CLINICAL DATA:  Weakness, decreased vision of right eye. EXAM: PORTABLE CHEST 1 VIEW COMPARISON:  February 27, 2023. FINDINGS: The heart size and mediastinal contours are within normal limits. Left lung is clear. Stable right basilar opacity is noted most consistent with scarring. The visualized skeletal structures are unremarkable. IMPRESSION: Stable right basilar scarring. No acute abnormality seen. Electronically Signed   By: Lynwood Landy Raddle M.D.   On: 09/14/2023 15:03   CT Head Wo Contrast Result Date: 09/14/2023 CLINICAL DATA:  Headache. Neurological deficit. Worsening falls. Visual disturbance. EXAM: CT HEAD WITHOUT CONTRAST TECHNIQUE: Contiguous  axial images were obtained from the base of the skull through the vertex without intravenous contrast. RADIATION DOSE REDUCTION: This exam was performed according to the departmental dose-optimization program which includes automated exposure control, adjustment of the mA and/or kV according to patient size and/or use of iterative reconstruction technique. COMPARISON:  08/01/2023 FINDINGS: Brain: No sign of acute stroke, mass, hemorrhage, hydrocephalus or extra-axial collection. Vascular: There is  atherosclerotic calcification of the major vessels at the base of the brain. Skull: Negative Sinuses/Orbits: Clear/normal Other: None IMPRESSION: No acute or reversible finding. Atherosclerotic calcification of the major vessels at the base of the brain. Electronically Signed   By: Oneil Officer M.D.   On: 09/14/2023 13:34     Procedures   Medications Ordered in the ED - No data to display                                  Medical Decision Making Patient complains of weakness and falling frequently.  Patient states he has not been eating very well for the past week.  Patient reports he has been drinking plenty of water.  Patient has a past medical history of alcoholic cirrhosis and a recent GI bleed.  Amount and/or Complexity of Data Reviewed External Data Reviewed: notes.    Details: Primary care notes and GI notes reviewed from last hospitalization Labs: ordered. Decision-making details documented in ED Course.    Details: Labs ordered reviewed and interpreted.  White blood cell count is 2.9 hemoglobin is 8.5.  Patient's potassium is 3.2 glucose is 100 BUN is 7. Hemoccult stool there is no stool or mucus in the vault, card is Hemoccult negative. Radiology: ordered and independent interpretation performed. Decision-making details documented in ED Course.    Details: CT head shows no acute findings Chest x-ray no acute  Discussion of management or test interpretation with external provider(s): I  discussed the patient with Jessica Zehr.  GI.  She advised admission to hospitalist and they will see the patient in the AM. Hospitalist consulted and will admit patient.        Final diagnoses:  Weakness  Falls frequently  Anemia, unspecified type    ED Discharge Orders     None          Flint Sonny MARLA DEVONNA 09/14/23 1641    Ula Prentice SAUNDERS, MD 09/14/23 [phone]

## 2023-09-15 ENCOUNTER — Observation Stay (HOSPITAL_COMMUNITY)

## 2023-09-15 DIAGNOSIS — R531 Weakness: Secondary | ICD-10-CM | POA: Diagnosis not present

## 2023-09-15 LAB — RAPID URINE DRUG SCREEN, HOSP PERFORMED
Amphetamines: NOT DETECTED
Barbiturates: NOT DETECTED
Benzodiazepines: POSITIVE — AB
Cocaine: NOT DETECTED
Opiates: NOT DETECTED
Tetrahydrocannabinol: NOT DETECTED

## 2023-09-15 LAB — CBC
HCT: 25.7 % — ABNORMAL LOW (ref 39.0–52.0)
Hemoglobin: 8 g/dL — ABNORMAL LOW (ref 13.0–17.0)
MCH: 25.8 pg — ABNORMAL LOW (ref 26.0–34.0)
MCHC: 31.1 g/dL (ref 30.0–36.0)
MCV: 82.9 fL (ref 80.0–100.0)
Platelets: 32 K/uL — ABNORMAL LOW (ref 150–400)
RBC: 3.1 MIL/uL — ABNORMAL LOW (ref 4.22–5.81)
RDW: 15.8 % — ABNORMAL HIGH (ref 11.5–15.5)
WBC: 2.3 K/uL — ABNORMAL LOW (ref 4.0–10.5)
nRBC: 0 % (ref 0.0–0.2)

## 2023-09-15 LAB — COMPREHENSIVE METABOLIC PANEL WITH GFR
ALT: 31 U/L (ref 0–44)
AST: 84 U/L — ABNORMAL HIGH (ref 15–41)
Albumin: 3 g/dL — ABNORMAL LOW (ref 3.5–5.0)
Alkaline Phosphatase: 107 U/L (ref 38–126)
Anion gap: 8 (ref 5–15)
BUN: 6 mg/dL — ABNORMAL LOW (ref 8–23)
CO2: 21 mmol/L — ABNORMAL LOW (ref 22–32)
Calcium: 8.5 mg/dL — ABNORMAL LOW (ref 8.9–10.3)
Chloride: 109 mmol/L (ref 98–111)
Creatinine, Ser: 0.6 mg/dL — ABNORMAL LOW (ref 0.61–1.24)
GFR, Estimated: >60 mL/min (ref >60)
Glucose, Bld: 81 mg/dL (ref 70–99)
Potassium: 3.4 mmol/L — ABNORMAL LOW (ref 3.5–5.1)
Sodium: 138 mmol/L (ref 135–145)
Total Bilirubin: 1.9 mg/dL — ABNORMAL HIGH (ref 0.0–1.2)
Total Protein: 6.1 g/dL — ABNORMAL LOW (ref 6.5–8.1)

## 2023-09-15 LAB — TSH: TSH: 0.026 u[IU]/mL — ABNORMAL LOW (ref 0.350–4.500)

## 2023-09-15 LAB — CK: Total CK: 253 U/L (ref 49–397)

## 2023-09-15 LAB — MAGNESIUM: Magnesium: 1.8 mg/dL (ref 1.7–2.4)

## 2023-09-15 MED ORDER — POTASSIUM CHLORIDE CRYS ER 20 MEQ PO TBCR
40.0000 meq | EXTENDED_RELEASE_TABLET | Freq: Once | ORAL | Status: AC
  Administered 2023-09-15: 40 meq via ORAL
  Filled 2023-09-15: qty 2

## 2023-09-15 NOTE — Care Management Obs Status (Signed)
 MEDICARE OBSERVATION STATUS NOTIFICATION   Patient Details  Name: Robert Greene MRN: [id number] Date of Birth: [birth year]   Medicare Observation Status Notification Given:  Yes    Siddarth Hsiung A Elmo Rio, LCSW 09/15/2023, 3:58 PM

## 2023-09-15 NOTE — Progress Notes (Signed)
 PROGRESS NOTE    Robert Greene  FMW:[id number] DOB: [birth year] DOA: 09/14/2023 PCP: [name], MD  Outpatient Specialists:     Brief Narrative:  As per H&P done earlier: Robert Greene is a 63 y.o. 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 Denies eye pain or acute vision changes. He reports had diarrhea today, denies blood in stool, reports it is yellow, no n/v. No fever, reports chronic epigastric dull pain, no new pain.   09/15/2023: Patient seen.  No new complaints.   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  Counseled to quit alcohol use.  Check TSH.   Right eye ptosis  Check mri orbit, mri brain  AChR 09/15/2023: MRI is nonrevealing.  Acetylcholine receptor antibody level not visualized.  Will reorder acetylcholine receptor antibody level.   Hypokalemia: - Potassium of 3.4 today. - KCl 40 mEq p.o. x 1 dose. - Check magnesium  level. - Renal panel in the morning.  Magnesium  level in the morning.   Acute on chronic anemia: Currently no overt bleeding Monitor cbc, GI on board 09/15/2023: H/H is stable.  alcoholic liver cirrhosis with esophageal varices: Thrombocytopenia,  Coagulopathy INR 1.3 Supportive measures. 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  Antimicrobials:  None.   Subjective: No new complaints  Objective: Vitals:   09/14/23 1925 09/14/23 2350 09/15/23 0515 09/15/23 1144  BP: (!) 148/79 134/82 129/74 138/83  Pulse: 91 77 70 72  Resp: 18 16 16 18   Temp: 98.8 F (37.1 C) 99.3 F (37.4 C) 99.3 F (37.4 C) 98.2 F (36.8 C)  TempSrc: Oral  Oral Oral  SpO2: 100% 96% 100% 99%  Weight:      Height:        Intake/Output Summary (Last 24 hours) at 09/15/2023 1615 Last data filed at 09/15/2023 1227 Gross per 24 hour  Intake 360 ml  Output 950 ml  Net -590 ml   Filed Weights   09/14/23 1157  Weight: 90.7 kg    Examination:  General exam: Appears calm and comfortable  Respiratory system: Clear to auscultation.  Cardiovascular system: S1 & S2 heard Gastrointestinal system: Abdomen is obese. Central nervous system: Alert and oriented.   Data Reviewed: I have personally reviewed following labs and imaging studies  CBC: Recent Labs  Lab 09/14/23 1225 09/15/23 0530  WBC 2.9* 2.3*  HGB 8.5* 8.0*  HCT 27.7* 25.7*  MCV 82.9 82.9  PLT 43* 32*   Basic Metabolic Panel: Recent Labs  Lab 09/14/23 1225 09/15/23 0530  NA 141 138  K 3.2* 3.4*  CL 109 109  CO2 21* 21*  GLUCOSE 100* 81  BUN 7* 6*  CREATININE 0.64 0.60*  CALCIUM  8.4* 8.5*  GFR: Estimated Creatinine Clearance: 105.2 mL/min (A) (by C-G formula based on SCr of 0.6 mg/dL (L)). Liver Function Tests: Recent Labs  Lab 09/14/23 1225 09/15/23 0530  AST 93* 84*  ALT 34 31  ALKPHOS 111 107  BILITOT 1.4* 1.9*  PROT 6.9 6.1*  ALBUMIN  3.3* 3.0*   No results for input(s): LIPASE, AMYLASE in the last 168 hours. Recent Labs  Lab 09/14/23 1622  AMMONIA 34   Coagulation Profile: No results for input(s): INR, PROTIME in the last 168 hours. Cardiac  Enzymes: Recent Labs  Lab 09/15/23 0530  CKTOTAL 253   BNP (last 3 results) No results for input(s): PROBNP in the last 8760 hours. HbA1C: No results for input(s): HGBA1C in the last 72 hours. CBG: No results for input(s): GLUCAP in the last 168 hours. Lipid Profile: No results for input(s): CHOL, HDL, LDLCALC, TRIG, CHOLHDL, LDLDIRECT in the last 72 hours. Thyroid Function Tests: No results for input(s): TSH, T4TOTAL, FREET4, T3FREE, THYROIDAB in the last 72 hours. Anemia Panel: No results for input(s): VITAMINB12, FOLATE, FERRITIN, TIBC, IRON, RETICCTPCT in the last 72 hours. Urine analysis:    Component Value Date/Time   COLORURINE YELLOW 09/14/2023 1612   APPEARANCEUR CLEAR 09/14/2023 1612   LABSPEC 1.015 09/14/2023 1612   PHURINE 6.0 09/14/2023 1612   GLUCOSEU NEGATIVE 09/14/2023 1612   HGBUR NEGATIVE 09/14/2023 1612   BILIRUBINUR NEGATIVE 09/14/2023 1612   BILIRUBINUR negative 09/16/2019 1114   KETONESUR NEGATIVE 09/14/2023 1612   PROTEINUR NEGATIVE 09/14/2023 1612   UROBILINOGEN 0.2 09/16/2019 1114   UROBILINOGEN 1.0 06/10/2009 1916   NITRITE NEGATIVE 09/14/2023 1612   LEUKOCYTESUR NEGATIVE 09/14/2023 1612   Sepsis Labs: @LABRCNTIP (procalcitonin:4,lacticidven:4)  )No results found for this or any previous visit (from the past 240 hours).       Radiology Studies: MR ORBITS WO CONTRAST Result Date: 09/15/2023 CLINICAL DATA:  64 year old male. Falls. Weakness. Right eye ptosis. Previous abdomen ultrasound states history of cirrhosis. EXAM: MRI HEAD AND ORBITS WITHOUT CONTRAST TECHNIQUE: Multiplanar, multi-echo pulse sequences of the brain and surrounding structures were acquired without intravenous contrast. Multiplanar, multi-echo pulse sequences of the orbits and surrounding structures were acquired including fat saturation techniques, without intravenous contrast administration. COMPARISON:  Head CT yesterday and earlier.  FINDINGS: MRI HEAD FINDINGS Brain: No restricted diffusion to suggest acute infarction. No midline shift, mass effect, evidence of mass lesion, ventriculomegaly, extra-axial collection or acute intracranial hemorrhage. Cervicomedullary junction and pituitary are within normal limits. Largely normal for age gray and white matter signal throughout the brain. No cortical encephalomalacia. No chronic cerebral blood products on SWI. However, mild T2 and FLAIR heterogeneity in the deep gray nuclei - which is partially related to perivascular spaces - but there is evidence of a chronic posterior left lentiform lacunar infarct on series 9, image 30. Chronic left thalamic lacunar infarct also not excluded. Additionally, better demonstrated on the thin slice dedicated orbit imaging, there is intrinsic increased T1 signal in the bilateral globus pallidus (series 6, image 16). Brainstem and cerebellum appear negative. Vascular: Major intracranial vascular flow voids are preserved. Skull and upper cervical spine: Within normal limits for age. Other: Mastoids are clear. Grossly normal visible internal auditory structures. Negative visible scalp and face. MRI ORBITS FINDINGS Orbits: Normal suprasellar cistern. Noncontrast optic chiasm, bilateral optic nerves, bilateral globes, and intraorbital soft tissues appear symmetric and negative. Visualized sinuses: Scattered mild paranasal sinus mucosal thickening and small retention cysts. Soft tissues: Visible face and periorbital soft tissues appear negative. Small volume retained secretions  in the nasopharynx, posterior right nasal cavity. IMPRESSION: 1. Negative noncontrast MRI appearance of the Orbits. 2. 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 And intrinsic T1 signal in the globus pallidus compatible with hepatic insufficiency, cirrhosis. Correlation with serum ammonia levels recommended. 3.  No superimposed acute intracranial abnormality. Electronically  Signed   By: VEAR Hurst M.D.   On: 09/15/2023 11:30   MR BRAIN WO CONTRAST Result Date: 09/15/2023 CLINICAL DATA:  63 year old male. Falls. Weakness. Right eye ptosis. Previous abdomen ultrasound states history of cirrhosis. EXAM: MRI HEAD AND ORBITS WITHOUT CONTRAST TECHNIQUE: Multiplanar, multi-echo pulse sequences of the brain and surrounding structures were acquired without intravenous contrast. Multiplanar, multi-echo pulse sequences of the orbits and surrounding structures were acquired including fat saturation techniques, without intravenous contrast administration. COMPARISON:  Head CT yesterday and earlier. FINDINGS: MRI HEAD FINDINGS Brain: No restricted diffusion to suggest acute infarction. No midline shift, mass effect, evidence of mass lesion, ventriculomegaly, extra-axial collection or acute intracranial hemorrhage. Cervicomedullary junction and pituitary are within normal limits. Largely normal for age gray and white matter signal throughout the brain. No cortical encephalomalacia. No chronic cerebral blood products on SWI. However, mild T2 and FLAIR heterogeneity in the deep gray nuclei - which is partially related to perivascular spaces - but there is evidence of a chronic posterior left lentiform lacunar infarct on series 9, image 30. Chronic left thalamic lacunar infarct also not excluded. Additionally, better demonstrated on the thin slice dedicated orbit imaging, there is intrinsic increased T1 signal in the bilateral globus pallidus (series 6, image 16). Brainstem and cerebellum appear negative. Vascular: Major intracranial vascular flow voids are preserved. Skull and upper cervical spine: Within normal limits for age. Other: Mastoids are clear. Grossly normal visible internal auditory structures. Negative visible scalp and face. MRI ORBITS FINDINGS Orbits: Normal suprasellar cistern. Noncontrast optic chiasm, bilateral optic nerves, bilateral globes, and intraorbital soft tissues appear  symmetric and negative. Visualized sinuses: Scattered mild paranasal sinus mucosal thickening and small retention cysts. Soft tissues: Visible face and periorbital soft tissues appear negative. Small volume retained secretions in the nasopharynx, posterior right nasal cavity. IMPRESSION: 1. Negative noncontrast MRI appearance of the Orbits. 2. 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 And intrinsic T1 signal in the globus pallidus compatible with hepatic insufficiency, cirrhosis. Correlation with serum ammonia levels recommended. 3.  No superimposed acute intracranial abnormality. Electronically Signed   By: VEAR Hurst M.D.   On: 09/15/2023 11:30   DG Chest Port 1 View Result Date: 09/14/2023 CLINICAL DATA:  Weakness, decreased vision of right eye. EXAM: PORTABLE CHEST 1 VIEW COMPARISON:  February 27, 2023. FINDINGS: The heart size and mediastinal contours are within normal limits. Left lung is clear. Stable right basilar opacity is noted most consistent with scarring. The visualized skeletal structures are unremarkable. IMPRESSION: Stable right basilar scarring. No acute abnormality seen. Electronically Signed   By: Lynwood Landy Raddle M.D.   On: 09/14/2023 15:03   CT Head Wo Contrast Result Date: 09/14/2023 CLINICAL DATA:  Headache. Neurological deficit. Worsening falls. Visual disturbance. EXAM: CT HEAD WITHOUT CONTRAST TECHNIQUE: Contiguous axial images were obtained from the base of the skull through the vertex without intravenous contrast. RADIATION DOSE REDUCTION: This exam was performed according to the departmental dose-optimization program which includes automated exposure control, adjustment of the mA and/or kV according to patient size and/or use of iterative reconstruction technique. COMPARISON:  08/01/2023 FINDINGS: Brain: No sign of acute stroke, mass,  hemorrhage, hydrocephalus or extra-axial collection. Vascular: There is atherosclerotic calcification of the major vessels at the  base of the brain. Skull: Negative Sinuses/Orbits: Clear/normal Other: None IMPRESSION: No acute or reversible finding. Atherosclerotic calcification of the major vessels at the base of the brain. Electronically Signed   By: Oneil Officer M.D.   On: 09/14/2023 13:34        Scheduled Meds:  folic acid   1 mg Oral Daily   hydroxychloroquine   200 mg Oral Daily   multivitamin with minerals  1 tablet Oral Daily   nadolol   20 mg Oral Daily   pantoprazole   40 mg Oral BID   thiamine   100 mg Oral Daily   Or   thiamine   100 mg Intravenous Daily   Continuous Infusions:   LOS: 0 days    Time spent: 55 minutes.    Leatrice Chapel, MD  [facility] Pager #: [phone] 7PM-7AM contact night coverage as above

## 2023-09-15 NOTE — Evaluation (Signed)
 Physical Therapy Evaluation Patient Details Name: Robert Greene MRN: [id number] DOB: October 07, 1960 Today's Date: 09/15/2023  History of Present Illness  Robert Greene is a 63 y.o. 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 Pt agreeable to session, states that he needs to use the bathroom and has BM. Pt continues to progress amb to 150ft with RW at supervision A and completes 3 steps per home setup at [facility]. Pt has 4WW at home but would benefit from RW at current functional level. Pt currently with functional limitations due to the deficits listed below (see PT Problem List). Pt will benefit from acute skilled PT to increase their independence and safety with mobility to allow discharge.           If plan is discharge home, recommend the following: A little help with walking and/or transfers;A little help with bathing/dressing/bathroom;Assist for transportation;Help with stairs or ramp for entrance   Can travel by private vehicle        Equipment Recommendations Rolling walker (2 wheels)  Recommendations for Other Services       Functional Status Assessment Patient has had a recent decline in their functional status and demonstrates the ability to make significant improvements in function in a reasonable and predictable amount of time.     Precautions / Restrictions Precautions Precautions: Fall Recall of Precautions/Restrictions: Intact Restrictions Weight Bearing Restrictions Per Provider Order: No      Mobility  Bed Mobility Overal bed mobility: Modified Independent                   Transfers Overall transfer level: Modified independent Equipment used: Rolling walker (2 wheels)               General transfer comment: increased time, but safe for STS and bed<>chair with RW    Ambulation/Gait Ambulation/Gait assistance: Supervision Gait Distance (Feet): 150 Feet Assistive device: Rolling walker (2 wheels) Gait Pattern/deviations: Step-through pattern, Decreased stride length, Trunk flexed Gait velocity: dec     General Gait Details: Pt amb with decreased cadence and velocity, has forward trunk position, appropriately sized walker, able to complete reciprocal pattern and directional changes well  Stairs Stairs: Yes Stairs assistance: Contact guard assist Stair Management: One rail Left, Sideways Number of Stairs: 3 General stair comments: per home setup, has increased difficulty due to knee pain but able to complete, takes time to recover before ascending (descend first then up)  Wheelchair Mobility     Tilt Bed    Modified Rankin (Stroke Patients Only)       Balance Overall balance assessment: Needs assistance Sitting-balance support: Feet supported Sitting balance-Leahy Scale: Good     Standing balance support: During functional activity, Reliant on assistive device for balance Standing balance-Leahy Scale: Fair                               Pertinent Vitals/Pain Pain Assessment Pain Assessment: Faces Faces Pain Scale: Hurts even more Pain Location: Bilateral knee pain with stair negotiation Pain Descriptors / Indicators: Aching, Discomfort, Grimacing Pain Intervention(s): Monitored during session, Limited activity within patient's  tolerance, Repositioned    Home Living Family/patient expects to be discharged to:: Private residence Living Arrangements: Alone Available Help at Discharge: Family;Available PRN/intermittently Type of Home: House Home Access: Stairs to enter Entrance Stairs-Rails:  Right Entrance Stairs-Number of Steps: 3   Home Layout: One level Home Equipment: Educational psychologist (4 wheels);Cane - single point;Grab bars - tub/shower      Prior Function Prior Level of Function : Independent/Modified Independent;History of Falls (last six months)                     Extremity/Trunk Assessment   Upper Extremity Assessment Upper Extremity Assessment: Overall WFL for tasks assessed    Lower Extremity Assessment Lower Extremity Assessment: Generalized weakness       Communication   Communication Communication: No apparent difficulties    Cognition Arousal: Alert Behavior During Therapy: WFL for tasks assessed/performed   PT - Cognitive impairments: No apparent impairments                         Following commands: Intact       Cueing Cueing Techniques: Verbal cues     General Comments      Exercises     Assessment/Plan    PT Assessment Patient needs continued PT services  PT Problem List Decreased strength;Decreased balance;Decreased mobility;Decreased activity tolerance       PT Treatment Interventions DME instruction;Functional mobility training;Balance training;Patient/family education;Gait training;Therapeutic activities;Stair training;Therapeutic exercise    PT Goals (Current goals can be found in the Care Plan section)  Acute Rehab PT Goals Patient Stated Goal: improve activity tolerance and return home PT Goal Formulation: With patient Time For Goal Achievement: 09/29/23 Potential to Achieve Goals: Good    Frequency Min 2X/week     Co-evaluation               AM-PAC PT 6 Clicks Mobility  Outcome Measure Help needed turning from your back to your side while in a flat bed without using bedrails?: None Help needed moving from lying on your back to sitting on the side of a flat bed without using bedrails?: None Help needed moving to and from a bed to a chair (including a wheelchair)?: None Help  needed standing up from a chair using your arms (e.g., wheelchair or bedside chair)?: None Help needed to walk in hospital room?: A Little Help needed climbing 3-5 steps with a railing? : A Little 6 Click Score: 22    End of Session Equipment Utilized During Treatment: Gait belt Activity Tolerance: Patient tolerated treatment well Patient left: in bed;with bed alarm set;with call bell/phone within reach Nurse Communication: Mobility status PT Visit Diagnosis: Muscle weakness (generalized) (M62.81);Difficulty in walking, not elsewhere classified (R26.2)    Time: 8674-8642 PT Time Calculation (min) (ACUTE ONLY): 32 min   Charges:   PT Evaluation $PT Eval Low Complexity: 1 Low PT Treatments $Gait Training: 8-22 mins PT General Charges $$ ACUTE PT VISIT: 1 Visit         Stann, PT Acute Rehabilitation Services Office: [phone] 09/15/2023   Stann DELENA Ohara 09/15/2023, 2:13 PM

## 2023-09-16 ENCOUNTER — Encounter (HOSPITAL_COMMUNITY): Payer: Self-pay | Admitting: Internal Medicine

## 2023-09-16 ENCOUNTER — Observation Stay (HOSPITAL_BASED_OUTPATIENT_CLINIC_OR_DEPARTMENT_OTHER): Admitting: Certified Registered Nurse Anesthetist

## 2023-09-16 ENCOUNTER — Observation Stay (HOSPITAL_COMMUNITY): Admitting: Certified Registered Nurse Anesthetist

## 2023-09-16 ENCOUNTER — Encounter (HOSPITAL_COMMUNITY): Admission: EM | Disposition: A | Discharge status: Home / Self Care | Payer: Self-pay | Source: Home / Self Care

## 2023-09-16 DIAGNOSIS — F1010 Alcohol abuse, uncomplicated: Secondary | ICD-10-CM | POA: Diagnosis not present

## 2023-09-16 DIAGNOSIS — K7030 Alcoholic cirrhosis of liver without ascites: Secondary | ICD-10-CM | POA: Diagnosis not present

## 2023-09-16 DIAGNOSIS — I8500 Esophageal varices without bleeding: Secondary | ICD-10-CM | POA: Diagnosis not present

## 2023-09-16 DIAGNOSIS — K7682 Hepatic encephalopathy: Secondary | ICD-10-CM | POA: Diagnosis not present

## 2023-09-16 DIAGNOSIS — I8510 Secondary esophageal varices without bleeding: Secondary | ICD-10-CM

## 2023-09-16 DIAGNOSIS — R531 Weakness: Secondary | ICD-10-CM | POA: Diagnosis not present

## 2023-09-16 DIAGNOSIS — K7290 Hepatic failure, unspecified without coma: Secondary | ICD-10-CM | POA: Diagnosis not present

## 2023-09-16 DIAGNOSIS — R110 Nausea: Secondary | ICD-10-CM | POA: Diagnosis not present

## 2023-09-16 DIAGNOSIS — K766 Portal hypertension: Secondary | ICD-10-CM | POA: Diagnosis not present

## 2023-09-16 DIAGNOSIS — K3189 Other diseases of stomach and duodenum: Secondary | ICD-10-CM | POA: Diagnosis not present

## 2023-09-16 DIAGNOSIS — I2510 Atherosclerotic heart disease of native coronary artery without angina pectoris: Secondary | ICD-10-CM | POA: Diagnosis not present

## 2023-09-16 DIAGNOSIS — D500 Iron deficiency anemia secondary to blood loss (chronic): Secondary | ICD-10-CM

## 2023-09-16 HISTORY — PX: ESOPHAGOGASTRODUODENOSCOPY: SHX5428

## 2023-09-16 LAB — CBC WITH DIFFERENTIAL/PLATELET
Abs Immature Granulocytes: 0.01 10*3/uL (ref 0.00–0.07)
Basophils Absolute: 0 10*3/uL (ref 0.0–0.1)
Basophils Relative: 0 %
Eosinophils Absolute: 0.1 10*3/uL (ref 0.0–0.5)
Eosinophils Relative: 4 %
HCT: 27.2 % — ABNORMAL LOW (ref 39.0–52.0)
Hemoglobin: 8.5 g/dL — ABNORMAL LOW (ref 13.0–17.0)
Immature Granulocytes: 0 %
Lymphocytes Relative: 49 %
Lymphs Abs: 1.6 10*3/uL (ref 0.7–4.0)
MCH: 25.8 pg — ABNORMAL LOW (ref 26.0–34.0)
MCHC: 31.3 g/dL (ref 30.0–36.0)
MCV: 82.7 fL (ref 80.0–100.0)
Monocytes Absolute: 0.6 10*3/uL (ref 0.1–1.0)
Monocytes Relative: 18 %
Neutro Abs: 0.9 10*3/uL — ABNORMAL LOW (ref 1.7–7.7)
Neutrophils Relative %: 29 %
Platelets: 31 10*3/uL — ABNORMAL LOW (ref 150–400)
RBC: 3.29 MIL/uL — ABNORMAL LOW (ref 4.22–5.81)
RDW: 15.4 % (ref 11.5–15.5)
WBC: 3.2 10*3/uL — ABNORMAL LOW (ref 4.0–10.5)
nRBC: 0 % (ref 0.0–0.2)

## 2023-09-16 LAB — RENAL FUNCTION PANEL
Albumin: 3.1 g/dL — ABNORMAL LOW (ref 3.5–5.0)
Anion gap: 10 (ref 5–15)
BUN: 8 mg/dL (ref 8–23)
CO2: 19 mmol/L — ABNORMAL LOW (ref 22–32)
Calcium: 8.9 mg/dL (ref 8.9–10.3)
Chloride: 105 mmol/L (ref 98–111)
Creatinine, Ser: 0.68 mg/dL (ref 0.61–1.24)
GFR, Estimated: >60 mL/min (ref >60)
Glucose, Bld: 88 mg/dL (ref 70–99)
Phosphorus: 2.4 mg/dL — ABNORMAL LOW (ref 2.5–4.6)
Potassium: 3.5 mmol/L (ref 3.5–5.1)
Sodium: 134 mmol/L — ABNORMAL LOW (ref 135–145)

## 2023-09-16 LAB — MAGNESIUM: Magnesium: 1.8 mg/dL (ref 1.7–2.4)

## 2023-09-16 SURGERY — EGD (ESOPHAGOGASTRODUODENOSCOPY)
Anesthesia: Monitor Anesthesia Care

## 2023-09-16 MED ORDER — PROPOFOL 500 MG/50ML IV EMUL
INTRAVENOUS | Status: AC
  Filled 2023-09-16: qty 100

## 2023-09-16 MED ORDER — PANTOPRAZOLE SODIUM 40 MG PO TBEC
40.0000 mg | DELAYED_RELEASE_TABLET | Freq: Every day | ORAL | Status: DC
  Administered 2023-09-16 – 2023-09-20 (×5): 40 mg via ORAL
  Filled 2023-09-16 (×5): qty 1

## 2023-09-16 MED ORDER — LIDOCAINE HCL (CARDIAC) PF 100 MG/5ML IV SOSY
PREFILLED_SYRINGE | INTRAVENOUS | Status: DC | PRN
  Administered 2023-09-16: 100 mg via INTRAVENOUS

## 2023-09-16 MED ORDER — MIDAZOLAM HCL 5 MG/5ML IJ SOLN
INTRAMUSCULAR | Status: DC | PRN
  Administered 2023-09-16: 2 mg via INTRAVENOUS

## 2023-09-16 MED ORDER — PROPOFOL 500 MG/50ML IV EMUL
INTRAVENOUS | Status: AC
  Filled 2023-09-16: qty 50

## 2023-09-16 MED ORDER — POTASSIUM CHLORIDE CRYS ER 20 MEQ PO TBCR
40.0000 meq | EXTENDED_RELEASE_TABLET | Freq: Once | ORAL | Status: AC
  Administered 2023-09-16: 40 meq via ORAL
  Filled 2023-09-16: qty 2

## 2023-09-16 MED ORDER — SODIUM CHLORIDE 0.9 % IV SOLN: INTRAVENOUS | Status: DC

## 2023-09-16 MED ORDER — K PHOS MONO-SOD PHOS DI & MONO 155-852-130 MG PO TABS
250.0000 mg | ORAL_TABLET | Freq: Three times a day (TID) | ORAL | Status: AC
  Administered 2023-09-16 (×3): 250 mg via ORAL
  Filled 2023-09-16 (×3): qty 1

## 2023-09-16 MED ORDER — MAGNESIUM SULFATE 2 GM/50ML IV SOLN
2.0000 g | Freq: Once | INTRAVENOUS | Status: AC
  Administered 2023-09-16: 2 g via INTRAVENOUS
  Filled 2023-09-16: qty 50

## 2023-09-16 MED ORDER — MIDAZOLAM HCL 2 MG/2ML IJ SOLN
INTRAMUSCULAR | Status: AC
  Filled 2023-09-16: qty 2

## 2023-09-16 MED ORDER — PROPOFOL 500 MG/50ML IV EMUL
INTRAVENOUS | Status: DC | PRN
  Administered 2023-09-16: 225 ug/kg/min via INTRAVENOUS

## 2023-09-16 MED ORDER — PROPOFOL 10 MG/ML IV BOLUS
INTRAVENOUS | Status: DC | PRN
  Administered 2023-09-16: 80 mg via INTRAVENOUS

## 2023-09-16 MED ORDER — CARVEDILOL 3.125 MG PO TABS
3.1250 mg | ORAL_TABLET | Freq: Two times a day (BID) | ORAL | Status: DC
  Administered 2023-09-16 – 2023-09-20 (×9): 3.125 mg via ORAL
  Filled 2023-09-16 (×10): qty 1

## 2023-09-16 NOTE — Plan of Care (Signed)

## 2023-09-16 NOTE — Op Note (Signed)
 [facility] Patient Name: Robert Greene Procedure Date: 09/16/2023 MRN: [id number] Attending MD: Gustav ALONSO Mcgee , MD, [id number] Date of Birth: 03/07/1960 CSN: [id number] Age: 63 Admit Type: Inpatient Procedure:                Upper GI endoscopy Indications:              Iron deficiency anemia secondary to chronic blood                            loss, Follow-up of esophageal varices, For therapy                            of esophageal varices Providers:                Gustav ALONSO Mcgee, MD, Collene Edu, RN, Farris Southgate, Technician Referring MD:              Medicines:                Monitored Anesthesia Care Complications:            No immediate complications. Estimated Blood Loss:     Estimated blood loss was minimal. Procedure:                Pre-Anesthesia Assessment:                           - Prior to the procedure, a History and Physical                            was performed, and patient medications and                            allergies were reviewed. The patient's tolerance of                            previous anesthesia was also reviewed. The risks                            and benefits of the procedure and the sedation                            options and risks were discussed with the patient.                            All questions were answered, and informed consent                            was obtained. Prior Anticoagulants: The patient has                            taken no anticoagulant or antiplatelet agents. ASA  Grade Assessment: III - A patient with severe                            systemic disease. After reviewing the risks and                            benefits, the patient was deemed in satisfactory                            condition to undergo the procedure.                           After obtaining informed consent, the endoscope was                             passed under direct vision. Throughout the                            procedure, the patient's blood pressure, pulse, and                            oxygen saturations were monitored continuously. The                            [id number]-[id number] ([id number] Olympus endoscope was introduced                            through the mouth, and advanced to the second part                            of duodenum. The upper GI endoscopy was                            accomplished without difficulty. The patient                            tolerated the procedure well. Scope In: Scope Out: Findings:      Three columns of grade I varices with no bleeding and no stigmata of       recent bleeding were found in the lower third of the esophagus, 35 to 40       cm from the incisors. They were less than 5 mm in largest diameter. No       red wale signs were present. Scarring from prior treatment was visible.      Mild portal hypertensive gastropathy was found in the entire examined       stomach.      The cardia and gastric fundus were normal on retroflexion.      The examined duodenum was normal. Impression:               - Grade I esophageal varices with no bleeding and                            no stigmata of recent bleeding.                           -  Portal hypertensive gastropathy.                           - Normal examined duodenum.                           - No specimens collected. Moderate Sedation:      N/A Recommendation:           - Resume previous diet.                           - Continue present medications.                           - Follow an antireflux regimen.                           - Pantoprazole  40 mg daily                           - Switch to Carvidelol 3.125mg  BID, DC Nadolol  for                            esophageal varices bleeding prophylaxis                           - Lactulose  10gm BID with goal 2-3 soft BM per day                           - Alcohol cessation                            - Gi signing off, please call with any questions Procedure Code(s):        --- Professional ---                           [phone], Esophagogastroduodenoscopy, flexible,                            transoral; diagnostic, including collection of                            specimen(s) by brushing or washing, when performed                            (separate procedure) Diagnosis Code(s):        --- Professional ---                           I85.00, Esophageal varices without bleeding                           K76.6, Portal hypertension                           K31.89, Other diseases of stomach and duodenum  D50.0, Iron deficiency anemia secondary to blood                            loss (chronic) CPT copyright 2022 [employer]. All rights reserved. The codes documented in this report are preliminary and upon coder review may  be revised to meet current compliance requirements. Robert Viverette V. Torsha Lemus, MD 09/16/2023 8:33:48 AM This report has been signed electronically. Number of Addenda: 0

## 2023-09-16 NOTE — Interval H&P Note (Signed)
 History and Physical Interval Note:  09/16/2023 7:57 AM  Robert Greene  has presented today for surgery, with the diagnosis of esophageal varices, cirrhosis.  The various methods of treatment have been discussed with the patient and family. After consideration of risks, benefits and other options for treatment, the patient has consented to  Procedure(s): EGD (ESOPHAGOGASTRODUODENOSCOPY) (N/A) as a surgical intervention.  The patient's history has been reviewed, patient examined, no change in status, stable for surgery.  I have reviewed the patient's chart and labs.  Questions were answered to the patient's satisfaction.     Libero Puthoff

## 2023-09-16 NOTE — Anesthesia Postprocedure Evaluation (Signed)
 Anesthesia Post Note  Patient: NGAI PARCELL  Procedure(s) Performed: EGD (ESOPHAGOGASTRODUODENOSCOPY)     Patient location during evaluation: Nursing Unit Anesthesia Type: MAC Level of consciousness: awake and alert Pain management: pain level controlled Vital Signs Assessment: post-procedure vital signs reviewed and stable Respiratory status: spontaneous breathing, nonlabored ventilation and respiratory function stable Cardiovascular status: stable and blood pressure returned to baseline Postop Assessment: no apparent nausea or vomiting Anesthetic complications: no   No notable events documented.  Last Vitals:  Vitals:   09/16/23 0916 09/16/23 1151  BP: (!) 144/81 (!) 142/83  Pulse: 62 79  Resp:  18  Temp:  36.8 C  SpO2:  98%    Last Pain:  Vitals:   09/16/23 1151  TempSrc: Oral  PainSc:                  Loghan Subia

## 2023-09-16 NOTE — Progress Notes (Signed)
 PROGRESS NOTE    Robert Greene  FMW:[id number] DOB: May 18, 1960 DOA: 09/14/2023 PCP: [name], MD  Outpatient Specialists:     Brief Narrative:  As per H&P done earlier: Robert Greene is a 63 y.o. 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 Denies eye pain or acute vision changes. He reports had diarrhea today, denies blood in stool, reports it is yellow, no n/v. No fever, reports chronic epigastric dull pain, no new pain.   09/15/2023: Patient seen.  No new complaints. 09/16/2023: EGD done today revealed grade 1 esophageal varices with no bleeding and no stigmata of recent bleeding, and portal hypertensive gastropathy.  Duodenum was normal.   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  Counseled to quit alcohol use.   09/16/2023: TSH is 0.026.  Check free T4.     Right eye ptosis  Check mri orbit, mri brain  AChR 09/15/2023: MRI is nonrevealing.  Acetylcholine receptor antibody level not visualized. 09/17/2023: Acetylcholine receptor antibody level normalized.   Hypokalemia: - Potassium of 3.5 today. - KCl 40 mEq p.o. x 1 dose. - Magnesium  of 1.8. - Will give IV magnesium  2 g x 1 dose.    Acute on chronic anemia: Currently no overt bleeding Monitor cbc, GI on board 09/15/2023: H/H is stable. 09/16/2023: Hemoglobin of 8.5 g/dL (stable)  alcoholic liver cirrhosis with esophageal  varices: Thrombocytopenia,  Coagulopathy INR 1.3 Supportive measures. 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  Antimicrobials:  None.   Subjective: No new complaints  Objective: Vitals:   09/16/23 0830 09/16/23 0840 09/16/23 0916 09/16/23 1151  BP: 135/73 139/69 (!) 144/81 (!) 142/83  Pulse: 72 70 62 79  Resp: 17 18  18   Temp: 98.3 F (36.8 C)   98.2 F (36.8 C)  TempSrc:    Oral  SpO2: 100% 100%  98%  Weight:      Height:        Intake/Output Summary (Last 24 hours) at 09/16/2023 1516 Last data filed at 09/16/2023 1217 Gross per 24 hour  Intake 340 ml  Output 2000 ml  Net -1660 ml   Filed Weights   09/14/23 1157  Weight: 90.7 kg    Examination:  General exam: Appears calm and comfortable  Respiratory system: Clear to auscultation.  Cardiovascular system: S1 & S2 heard Gastrointestinal system: Abdomen is obese. Central nervous system: Alert and oriented.   Data Reviewed: I have personally reviewed following labs and imaging studies  CBC: Recent Labs  Lab 09/14/23 1225 09/15/23 0530 09/16/23 0425  WBC 2.9* 2.3* 3.2*  NEUTROABS  --   --  0.9*  HGB 8.5* 8.0* 8.5*  HCT 27.7* 25.7* 27.2*  MCV 82.9 82.9 82.7  PLT  43* 32* 31*   Basic Metabolic Panel: Recent Labs  Lab 09/14/23 1225 09/15/23 0530 09/15/23 1943 09/16/23 0425  NA 141 138  --  134*  K 3.2* 3.4*  --  3.5  CL 109 109  --  105  CO2 21* 21*  --  19*  GLUCOSE 100* 81  --  88  BUN 7* 6*  --  8  CREATININE 0.64 0.60*  --  0.68  CALCIUM  8.4* 8.5*  --  8.9  MG  --   --  1.8 1.8  PHOS  --   --   --  2.4*   GFR: Estimated Creatinine Clearance: 105.2 mL/min (by C-G formula based on SCr of 0.68  mg/dL). Liver Function Tests: Recent Labs  Lab 09/14/23 1225 09/15/23 0530 09/16/23 0425  AST 93* 84*  --   ALT 34 31  --   ALKPHOS 111 107  --   BILITOT 1.4* 1.9*  --   PROT 6.9 6.1*  --   ALBUMIN  3.3* 3.0* 3.1*   No results for input(s): LIPASE, AMYLASE in the last 168 hours. Recent Labs  Lab 09/14/23 1622  AMMONIA 34   Coagulation Profile: No results for input(s): INR, PROTIME in the last 168 hours. Cardiac Enzymes: Recent Labs  Lab 09/15/23 0530  CKTOTAL 253   BNP (last 3 results) No results for input(s): PROBNP in the last 8760 hours. HbA1C: No results for input(s): HGBA1C in the last 72 hours. CBG: No results for input(s): GLUCAP in the last 168 hours. Lipid Profile: No results for input(s): CHOL, HDL, LDLCALC, TRIG, CHOLHDL, LDLDIRECT in the last 72 hours. Thyroid Function Tests: Recent Labs    09/15/23 1943  TSH 0.026*   Anemia Panel: No results for input(s): VITAMINB12, FOLATE, FERRITIN, TIBC, IRON, RETICCTPCT in the last 72 hours. Urine analysis:    Component Value Date/Time   COLORURINE YELLOW 09/14/2023 1612   APPEARANCEUR CLEAR 09/14/2023 1612   LABSPEC 1.015 09/14/2023 1612   PHURINE 6.0 09/14/2023 1612   GLUCOSEU NEGATIVE 09/14/2023 1612   HGBUR NEGATIVE 09/14/2023 1612   BILIRUBINUR NEGATIVE 09/14/2023 1612   BILIRUBINUR negative 09/16/2019 1114   KETONESUR NEGATIVE 09/14/2023 1612   PROTEINUR NEGATIVE 09/14/2023 1612   UROBILINOGEN 0.2 09/16/2019 1114   UROBILINOGEN 1.0 06/10/2009 1916   NITRITE NEGATIVE 09/14/2023 1612   LEUKOCYTESUR NEGATIVE 09/14/2023 1612   Sepsis Labs: @LABRCNTIP (procalcitonin:4,lacticidven:4)  )No results found for this or any previous visit (from the past 240 hours).       Radiology Studies: MR ORBITS WO CONTRAST Result Date: 09/15/2023 CLINICAL DATA:  63 year old male. Falls. Weakness. Right eye ptosis. Previous abdomen ultrasound states history of cirrhosis. EXAM:  MRI HEAD AND ORBITS WITHOUT CONTRAST TECHNIQUE: Multiplanar, multi-echo pulse sequences of the brain and surrounding structures were acquired without intravenous contrast. Multiplanar, multi-echo pulse sequences of the orbits and surrounding structures were acquired including fat saturation techniques, without intravenous contrast administration. COMPARISON:  Head CT yesterday and earlier. FINDINGS: MRI HEAD FINDINGS Brain: No restricted diffusion to suggest acute infarction. No midline shift, mass effect, evidence of mass lesion, ventriculomegaly, extra-axial collection or acute intracranial hemorrhage. Cervicomedullary junction and pituitary are within normal limits. Largely normal for age gray and white matter signal throughout the brain. No cortical encephalomalacia. No chronic cerebral blood products on SWI. However, mild T2 and FLAIR heterogeneity in the deep gray nuclei - which is partially related to perivascular spaces - but there is evidence of a chronic posterior left lentiform lacunar infarct on series 9,  image 30. Chronic left thalamic lacunar infarct also not excluded. Additionally, better demonstrated on the thin slice dedicated orbit imaging, there is intrinsic increased T1 signal in the bilateral globus pallidus (series 6, image 16). Brainstem and cerebellum appear negative. Vascular: Major intracranial vascular flow voids are preserved. Skull and upper cervical spine: Within normal limits for age. Other: Mastoids are clear. Grossly normal visible internal auditory structures. Negative visible scalp and face. MRI ORBITS FINDINGS Orbits: Normal suprasellar cistern. Noncontrast optic chiasm, bilateral optic nerves, bilateral globes, and intraorbital soft tissues appear symmetric and negative. Visualized sinuses: Scattered mild paranasal sinus mucosal thickening and small retention cysts. Soft tissues: Visible face and periorbital soft tissues appear negative. Small volume retained secretions in the  nasopharynx, posterior right nasal cavity. IMPRESSION: 1. Negative noncontrast MRI appearance of the Orbits. 2. 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 And intrinsic T1 signal in the globus pallidus compatible with hepatic insufficiency, cirrhosis. Correlation with serum ammonia levels recommended. 3.  No superimposed acute intracranial abnormality. Electronically Signed   By: VEAR Hurst M.D.   On: 09/15/2023 11:30   MR BRAIN WO CONTRAST Result Date: 09/15/2023 CLINICAL DATA:  63 year old male. Falls. Weakness. Right eye ptosis. Previous abdomen ultrasound states history of cirrhosis. EXAM: MRI HEAD AND ORBITS WITHOUT CONTRAST TECHNIQUE: Multiplanar, multi-echo pulse sequences of the brain and surrounding structures were acquired without intravenous contrast. Multiplanar, multi-echo pulse sequences of the orbits and surrounding structures were acquired including fat saturation techniques, without intravenous contrast administration. COMPARISON:  Head CT yesterday and earlier. FINDINGS: MRI HEAD FINDINGS Brain: No restricted diffusion to suggest acute infarction. No midline shift, mass effect, evidence of mass lesion, ventriculomegaly, extra-axial collection or acute intracranial hemorrhage. Cervicomedullary junction and pituitary are within normal limits. Largely normal for age gray and white matter signal throughout the brain. No cortical encephalomalacia. No chronic cerebral blood products on SWI. However, mild T2 and FLAIR heterogeneity in the deep gray nuclei - which is partially related to perivascular spaces - but there is evidence of a chronic posterior left lentiform lacunar infarct on series 9, image 30. Chronic left thalamic lacunar infarct also not excluded. Additionally, better demonstrated on the thin slice dedicated orbit imaging, there is intrinsic increased T1 signal in the bilateral globus pallidus (series 6, image 16). Brainstem and cerebellum appear negative. Vascular:  Major intracranial vascular flow voids are preserved. Skull and upper cervical spine: Within normal limits for age. Other: Mastoids are clear. Grossly normal visible internal auditory structures. Negative visible scalp and face. MRI ORBITS FINDINGS Orbits: Normal suprasellar cistern. Noncontrast optic chiasm, bilateral optic nerves, bilateral globes, and intraorbital soft tissues appear symmetric and negative. Visualized sinuses: Scattered mild paranasal sinus mucosal thickening and small retention cysts. Soft tissues: Visible face and periorbital soft tissues appear negative. Small volume retained secretions in the nasopharynx, posterior right nasal cavity. IMPRESSION: 1. Negative noncontrast MRI appearance of the Orbits. 2. 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 And intrinsic T1 signal in the globus pallidus compatible with hepatic insufficiency, cirrhosis. Correlation with serum ammonia levels recommended. 3.  No superimposed acute intracranial abnormality. Electronically Signed   By: VEAR Hurst M.D.   On: 09/15/2023 11:30        Scheduled Meds:  carvedilol   3.125 mg Oral BID WC   folic acid   1 mg Oral Daily   hydroxychloroquine   200 mg Oral Daily   multivitamin with minerals  1 tablet Oral Daily   pantoprazole   40  mg Oral Daily   phosphorus  250 mg Oral TID   thiamine   100 mg Oral Daily   Or   thiamine   100 mg Intravenous Daily   Continuous Infusions:   LOS: 0 days    Time spent: 35 minutes.    Leatrice Chapel, MD  [facility] Pager #: [phone] 7PM-7AM contact night coverage as above

## 2023-09-16 NOTE — Transfer of Care (Signed)
 Immediate Anesthesia Transfer of Care Note  [name]  Procedure(s) Performed: EGD (ESOPHAGOGASTRODUODENOSCOPY)  Patient Location: PACU  Anesthesia Type:MAC  Level of Consciousness: drowsy  Airway & Oxygen Therapy: Patient Spontanous Breathing  Post-op Assessment: Report given to RN and Post -op Vital signs reviewed and stable  Post vital signs: Reviewed and stable  Last Vitals:  Vitals Value Taken Time  BP 132/91 09/16/23 08:17  Temp    Pulse 74 09/16/23 08:19  Resp 13 09/16/23 08:19  SpO2 100 % 09/16/23 08:19  Vitals shown include unfiled device data.  Last Pain:  Vitals:   09/16/23 0730  TempSrc: Temporal  PainSc: 1          Complications: No notable events documented.

## 2023-09-16 NOTE — Anesthesia Preprocedure Evaluation (Addendum)
 Anesthesia Evaluation  Patient identified by MRN, date of birth, ID band Patient awake    Reviewed: Allergy & Precautions, NPO status , Patient's Chart, lab work & pertinent test results  History of Anesthesia Complications Negative for: history of anesthetic complications  Airway Mallampati: IV  TM Distance: >3 FB Neck ROM: Full    Dental  (+) Poor Dentition, Dental Advisory Given   Pulmonary neg shortness of breath, neg sleep apnea, neg COPD, neg recent URI, Current Smoker and Patient abstained from smoking.   breath sounds clear to auscultation       Cardiovascular hypertension, (-) angina + CAD  (-) Past MI and (-) CHF  Rhythm:Regular     Neuro/Psych neg Seizures PSYCHIATRIC DISORDERS Anxiety        GI/Hepatic ,,,(+) Cirrhosis     substance abuse  alcohol useLab Results      Component                Value               Date                      ALT                      31                  09/15/2023                AST                      84 (H)              09/15/2023                ALKPHOS                  107                 09/15/2023                BILITOT                  1.9 (H)             09/15/2023            ? GI bleed   Endo/Other    Renal/GU Renal disease     Musculoskeletal  (+) Arthritis ,    Abdominal   Peds  Hematology  (+) Blood dyscrasia, anemia Lab Results      Component                Value               Date                      WBC                      3.2 (L)             09/16/2023                HGB                      8.5 (L)             09/16/2023  HCT                      27.2 (L)            09/16/2023                MCV                      82.7                09/16/2023                PLT                      31 (L)              09/16/2023              Anesthesia Other Findings   Reproductive/Obstetrics                               Anesthesia Physical Anesthesia Plan  ASA: 4  Anesthesia Plan: MAC   Post-op Pain Management: Minimal or no pain anticipated   Induction: Intravenous  PONV Risk Score and Plan: 1 and Propofol  infusion and Treatment may vary due to age or medical condition  Airway Management Planned: Nasal Cannula, Natural Airway and Simple Face Mask  Additional Equipment: None  Intra-op Plan:   Post-operative Plan:   Informed Consent: I have reviewed the patients History and Physical, chart, labs and discussed the procedure including the risks, benefits and alternatives for the proposed anesthesia with the patient or authorized representative who has indicated his/her understanding and acceptance.   Patient has DNR.  Discussed DNR with patient and Continue DNR.   Dental advisory given  Plan Discussed with: CRNA  Anesthesia Plan Comments:          Anesthesia Quick Evaluation

## 2023-09-17 ENCOUNTER — Other Ambulatory Visit: Payer: Self-pay

## 2023-09-17 ENCOUNTER — Encounter (HOSPITAL_COMMUNITY): Payer: Self-pay | Admitting: Gastroenterology

## 2023-09-17 DIAGNOSIS — Z122 Encounter for screening for malignant neoplasm of respiratory organs: Secondary | ICD-10-CM

## 2023-09-17 DIAGNOSIS — Z87891 Personal history of nicotine dependence: Secondary | ICD-10-CM

## 2023-09-17 DIAGNOSIS — R531 Weakness: Secondary | ICD-10-CM | POA: Diagnosis not present

## 2023-09-17 DIAGNOSIS — F17210 Nicotine dependence, cigarettes, uncomplicated: Secondary | ICD-10-CM

## 2023-09-17 LAB — CBC WITH DIFFERENTIAL/PLATELET
Abs Immature Granulocytes: 0.01 K/uL (ref 0.00–0.07)
Basophils Absolute: 0 K/uL (ref 0.0–0.1)
Basophils Relative: 0 %
Eosinophils Absolute: 0.2 K/uL (ref 0.0–0.5)
Eosinophils Relative: 5 %
HCT: 27.8 % — ABNORMAL LOW (ref 39.0–52.0)
Hemoglobin: 8.8 g/dL — ABNORMAL LOW (ref 13.0–17.0)
Immature Granulocytes: 0 %
Lymphocytes Relative: 36 %
Lymphs Abs: 1.2 K/uL (ref 0.7–4.0)
MCH: 26.1 pg (ref 26.0–34.0)
MCHC: 31.7 g/dL (ref 30.0–36.0)
MCV: 82.5 fL (ref 80.0–100.0)
Monocytes Absolute: 0.5 K/uL (ref 0.1–1.0)
Monocytes Relative: 15 %
Neutro Abs: 1.4 K/uL — ABNORMAL LOW (ref 1.7–7.7)
Neutrophils Relative %: 44 %
Platelets: 30 K/uL — ABNORMAL LOW (ref 150–400)
RBC: 3.37 MIL/uL — ABNORMAL LOW (ref 4.22–5.81)
RDW: 15.5 % (ref 11.5–15.5)
WBC: 3.3 K/uL — ABNORMAL LOW (ref 4.0–10.5)
nRBC: 0 % (ref 0.0–0.2)

## 2023-09-17 LAB — RENAL FUNCTION PANEL
Albumin: 3 g/dL — ABNORMAL LOW (ref 3.5–5.0)
Anion gap: 5 (ref 5–15)
BUN: 10 mg/dL (ref 8–23)
CO2: 21 mmol/L — ABNORMAL LOW (ref 22–32)
Calcium: 8.7 mg/dL — ABNORMAL LOW (ref 8.9–10.3)
Chloride: 109 mmol/L (ref 98–111)
Creatinine, Ser: 0.63 mg/dL (ref 0.61–1.24)
GFR, Estimated: >60 mL/min (ref >60)
Glucose, Bld: 127 mg/dL — ABNORMAL HIGH (ref 70–99)
Phosphorus: 3 mg/dL (ref 2.5–4.6)
Potassium: 3.6 mmol/L (ref 3.5–5.1)
Sodium: 135 mmol/L (ref 135–145)

## 2023-09-17 LAB — T4, FREE: Free T4: 1.4 ng/dL — ABNORMAL HIGH (ref 0.61–1.12)

## 2023-09-17 LAB — AMMONIA: Ammonia: 79 umol/L — ABNORMAL HIGH (ref 9–35)

## 2023-09-17 LAB — ACETYLCHOLINE RECEPTOR, BINDING: Acety choline binding ab: <0.07 nmol/L (ref 0.00–0.24)

## 2023-09-17 MED ORDER — VITAMIN B-1 100 MG PO TABS: 100.0000 mg | ORAL_TABLET | Freq: Every day | ORAL | 1 refills | Status: DC

## 2023-09-17 MED ORDER — ADULT MULTIVITAMIN W/MINERALS CH: 1.0000 | ORAL_TABLET | Freq: Every day | ORAL | 1 refills | Status: DC

## 2023-09-17 MED ORDER — FOLIC ACID 1 MG PO TABS: 1.0000 mg | ORAL_TABLET | Freq: Every day | ORAL | 1 refills | Status: DC

## 2023-09-17 MED ORDER — CARVEDILOL 3.125 MG PO TABS: 3.1250 mg | ORAL_TABLET | Freq: Two times a day (BID) | ORAL | 1 refills | Status: AC

## 2023-09-17 NOTE — TOC Initial Note (Addendum)
 Transition of Care [facility]) - Initial/Assessment Note    Patient Details  Name: Robert Greene MRN: [id number] Date of Birth: 1960/11/28  [facility] CM/SW Contact:    Doneta Glenys DASEN, RN Phone Number: 09/17/2023, 4:40 PM  Clinical Narrative:                 Presented for generalized weakness. CM spoke with patient in room. Patient very lethargic and was unsure of questions being asked. CM ask permission to call son Robert Greene [phone] and daughter Robert Greene [phone].CM left messages with both. Patient son Robert Greene returned call and placed on speaker phone while in room. Logan states [facility] was coming out for PT every Thursday up to 2 weeks ago. Logan confirmed that patient does have rolling walker. CM informed Robert Greene that the discharge for today has be cancelled. CM informed patient that PT's recommendation is for SNF. Patient states he didn't care either way, however would like to go home. CM will start SNF referral work-up. 5:12 PM FL2, PASRR completed. Awaiting bed offers.  Expected Discharge Plan: Skilled Nursing Facility Barriers to Discharge: Continued Medical Work up, SNF Pending bed offer   Patient Goals and CMS Choice Patient states their goals for this hospitalization and ongoing recovery are:: Really rather go home CMS Medicare.gov Compare Post Acute Care list provided to::  (NA) Choice offered to / list presented to : NA [place] ownership interest in [facility].provided to:: Parent NA    Expected Discharge Plan and Services In-house Referral: NA Discharge Planning Services: CM Consult Post Acute Care Choice: NA Living arrangements for the past 2 months: Single Family Home Expected Discharge Date: 09/17/23               DME Arranged: N/A DME Agency: NA       HH Arranged: NA HH Agency: NA        Prior Living Arrangements/Services Living arrangements for the past 2 months: Single Family Home Lives with:: Self Patient language and  need for interpreter reviewed:: Yes Do you feel safe going back to the place where you live?: Yes      Need for Family Participation in Patient Care: Yes (Comment) Care giver support system in place?: Yes (comment) Current home services: DME (walker) Criminal Activity/Legal Involvement Pertinent to Current Situation/Hospitalization: No - Comment as needed  Activities of Daily Living   ADL Screening (condition at time of admission) Independently performs ADLs?: Yes (appropriate for developmental age) Is the patient deaf or have difficulty hearing?: No Does the patient have difficulty seeing, even when wearing glasses/contacts?: No Does the patient have difficulty concentrating, remembering, or making decisions?: No  Permission Sought/Granted Permission sought to share information with : Case Manager Permission granted to share information with : Yes, Verbal Permission Granted  Share Information with NAME: SULLY, DYMENT (Son)  [phone] and Robert Greene daughter [phone]           Emotional Assessment Appearance:: Appears stated age Attitude/Demeanor/Rapport: Lethargic Affect (typically observed): Agitated Orientation: : Oriented to Self, Oriented to Place, Oriented to  Time, Oriented to Situation Alcohol / Substance Use: Alcohol Use Psych Involvement: No (comment)  Admission diagnosis:  Weakness [R53.1] Falls frequently [R29.6] Anemia, unspecified type [D64.9] Patient Active Problem List   Diagnosis Date Noted   Weakness 09/14/2023   Gastritis without bleeding 08/03/2023   Cirrhosis of liver with ascites (HCC) 08/02/2023   Rectal bleeding 08/02/2023   GI bleed 08/01/2023   Hypotension 01/30/2023   Alcohol use disorder, moderate,  dependence (HCC) 01/30/2023   Colon cancer screening 05/13/2022   Alcoholic cirrhosis of liver with ascites (HCC) 05/13/2022   Secondary esophageal varices without bleeding (HCC) 11/10/2021   Portal hypertensive gastropathy (HCC) 11/10/2021    Duodenal nodule 11/10/2021   Alcoholic cirrhosis of liver without ascites (HCC) 07/16/2021   Anemia 07/16/2021   History of colon polyps 07/16/2021   Alcohol use disorder, severe, dependence (HCC) 07/16/2021   Atherosclerosis of both carotid arteries 07/07/2019   Essential hypertension 04/08/2018   Coronary artery calcification seen on CAT scan 04/08/2018   Laboratory examination 04/08/2018   Mixed hyperlipidemia 04/08/2018   ILD (interstitial lung disease) (HCC) 12/06/2017   PCP:  [name], MD Pharmacy:   [facility] [phone] - [place], [place] - [address] AT [facility] [address] [place] [address] Phone: [phone] Fax: [phone]     Social Drivers of Health (SDOH) Social History: SDOH Screenings   Food Insecurity: No Food Insecurity (09/14/2023)  Housing: Low Risk  (09/14/2023)  Transportation Needs: No Transportation Needs (09/14/2023)  Utilities: Not At Risk (09/14/2023)  Tobacco Use: High Risk (09/16/2023)   SDOH Interventions:     Readmission Risk Interventions     No data to display

## 2023-09-17 NOTE — Progress Notes (Signed)
 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 Denies eye pain or acute vision changes.  Significantly, TSH was low with elevated free T4 (suggestive of likely hyperthyroidism).  Will have a low threshold to repeat thyroid workup.  09/16/2023: EGD done today revealed grade 1 esophageal varices with no bleeding and no stigmata of recent bleeding, and portal hypertensive gastropathy.  Duodenum was normal. 09/17/2023: Patient said to be sleepy.  Unsteady on his feet.  PT has recommended subacute rehab at a skilled nursing facility.  Assessment & Plan:   Principal Problem:   Weakness   Generalized Weakness and falls: -Likely from continued alcohol use -Fall precaution -PT recommends subacute rehab at [facility]. - Thyroid workup is suggestive of possible hyperthyroidism.  Have a low threshold to repeat thyroid workup.   Right eye ptosis  -MRI of the orbit is nonrevealing. - MRI brain is not revealing. - Acetylcholine receptor antibodies negative.    Hypokalemia: - Potassium of 3.6 today. - KCl 40 mEq p.o. x 1 dose.   Acute on chronic anemia: No overt bleeding Hemoglobin stable. EGD finding is noted.  alcoholic liver cirrhosis with esophageal varices: Thrombocytopenia,  Coagulopathy INR 1.3 Supportive measures. 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  Procedure: EGD revealed: Three columns of grade I varices with no bleeding and no stigmata of       recent bleeding were found in the lower third of the esophagus, 35 to 40       cm from the incisors. They were less than 5 mm in largest diameter. No       red wale signs were present. Scarring from prior treatment was visible.      Mild portal hypertensive gastropathy was found in the entire examined       stomach.      The cardia and gastric fundus were normal on retroflexion.      The examined duodenum was normal. Impression:       - Grade I esophageal varices with no bleeding and                            no stigmata of recent bleeding.                           - Portal hypertensive gastropathy.                           - Normal examined duodenum.                           -  No specimens collected.  Antimicrobials:  None.   Subjective: No new complaints  Objective: Vitals:   09/17/23 0757 09/17/23 0835 09/17/23 1201 09/17/23 1646  BP: (!) 169/98 (!) 169/98 (!) 147/73 (!) 147/90  Pulse: 74 74 73 90  Resp:   16   Temp:   98.2 F (36.8 C)   TempSrc:   Oral   SpO2: 100%  99%   Weight:      Height:        Intake/Output Summary (Last 24 hours) at 09/17/2023 1920 Last data filed at 09/17/2023 1200 Gross per 24 hour  Intake 240 ml  Output --  Net 240 ml   Filed Weights   09/14/23 1157  Weight: 90.7 kg    Examination:  General exam: Patient is sleepy.   Respiratory system: Clear to auscultation.  Cardiovascular system: S1 & S2 heard Gastrointestinal system: Abdomen is obese. Central nervous system: Alert and oriented.   Data Reviewed: I have personally reviewed following labs and imaging studies  CBC: Recent Labs  Lab  09/14/23 1225 09/15/23 0530 09/16/23 0425 09/17/23 0443  WBC 2.9* 2.3* 3.2* 3.3*  NEUTROABS  --   --  0.9* 1.4*  HGB 8.5* 8.0* 8.5* 8.8*  HCT 27.7* 25.7* 27.2* 27.8*  MCV 82.9 82.9 82.7 82.5  PLT 43* 32* 31* 30*   Basic Metabolic Panel: Recent Labs  Lab 09/14/23 1225 09/15/23 0530 09/15/23 1943 09/16/23 0425 09/17/23 0443  NA 141 138  --  134* 135  K 3.2* 3.4*  --  3.5 3.6  CL 109 109  --  105 109  CO2 21* 21*  --  19* 21*  GLUCOSE 100* 81  --  88 127*  BUN 7* 6*  --  8 10  CREATININE 0.64 0.60*  --  0.68 0.63  CALCIUM  8.4* 8.5*  --  8.9 8.7*  MG  --   --  1.8 1.8  --   PHOS  --   --   --  2.4* 3.0   GFR: Estimated Creatinine Clearance: 105.2 mL/min (by C-G formula based on SCr of 0.63 mg/dL). Liver Function Tests: Recent Labs  Lab 09/14/23 1225 09/15/23 0530 09/16/23 0425 09/17/23 0443  AST 93* 84*  --   --   ALT 34 31  --   --   ALKPHOS 111 107  --   --   BILITOT 1.4* 1.9*  --   --   PROT 6.9 6.1*  --   --   ALBUMIN  3.3* 3.0* 3.1* 3.0*   No results for input(s): LIPASE, AMYLASE in the last 168 hours. Recent Labs  Lab 09/14/23 1622 09/17/23 1745  AMMONIA 34 79*   Coagulation Profile: No results for input(s): INR, PROTIME in the last 168 hours. Cardiac Enzymes: Recent Labs  Lab 09/15/23 0530  CKTOTAL 253   BNP (last 3 results) No results for input(s): PROBNP in the last 8760 hours. HbA1C: No results for input(s): HGBA1C in the last 72 hours. CBG: No results for input(s): GLUCAP in the last 168 hours. Lipid Profile: No results for input(s): CHOL, HDL, LDLCALC, TRIG, CHOLHDL, LDLDIRECT in the last 72 hours. Thyroid Function Tests: Recent Labs    09/15/23 1943 09/17/23 0443  TSH 0.026*  --   FREET4  --  1.40*   Anemia Panel: No results for input(s): VITAMINB12, FOLATE, FERRITIN, TIBC, IRON, RETICCTPCT in the last 72 hours. Urine analysis:    Component Value Date/Time   COLORURINE YELLOW 09/14/2023  1612   APPEARANCEUR  CLEAR 09/14/2023 1612   LABSPEC 1.015 09/14/2023 1612   PHURINE 6.0 09/14/2023 1612   GLUCOSEU NEGATIVE 09/14/2023 1612   HGBUR NEGATIVE 09/14/2023 1612   BILIRUBINUR NEGATIVE 09/14/2023 1612   BILIRUBINUR negative 09/16/2019 1114   KETONESUR NEGATIVE 09/14/2023 1612   PROTEINUR NEGATIVE 09/14/2023 1612   UROBILINOGEN 0.2 09/16/2019 1114   UROBILINOGEN 1.0 06/10/2009 1916   NITRITE NEGATIVE 09/14/2023 1612   LEUKOCYTESUR NEGATIVE 09/14/2023 1612   Sepsis Labs: @LABRCNTIP (procalcitonin:4,lacticidven:4)  )No results found for this or any previous visit (from the past 240 hours).       Radiology Studies: No results found.       Scheduled Meds:  carvedilol   3.125 mg Oral BID WC   folic acid   1 mg Oral Daily   hydroxychloroquine   200 mg Oral Daily   multivitamin with minerals  1 tablet Oral Daily   pantoprazole   40 mg Oral Daily   thiamine   100 mg Oral Daily   Or   thiamine   100 mg Intravenous Daily   Continuous Infusions:   LOS: 0 days    Time spent: 35 minutes.    Leatrice Chapel, MD  [facility] Pager #: [phone] 7PM-7AM contact night coverage as above

## 2023-09-17 NOTE — Plan of Care (Signed)
  Problem: Education: Goal: Knowledge of General Education information will improve Description: Including pain rating scale, medication(s)/side effects and non-pharmacologic comfort measures Outcome: Progressing   Problem: Clinical Measurements: Goal: Respiratory complications will improve Outcome: Progressing Goal: Cardiovascular complication will be avoided Outcome: Progressing   Problem: Activity: Goal: Risk for activity intolerance will decrease Outcome: Progressing   Problem: Nutrition: Goal: Adequate nutrition will be maintained Outcome: Progressing   Problem: Coping: Goal: Level of anxiety will decrease Outcome: Progressing   Problem: Pain Managment: Goal: General experience of comfort will improve and/or be controlled Outcome: Progressing   Problem: Safety: Goal: Ability to remain free from injury will improve Outcome: Progressing

## 2023-09-17 NOTE — Plan of Care (Signed)
  Problem: Education: Goal: Knowledge of General Education information will improve Description: Including pain rating scale, medication(s)/side effects and non-pharmacologic comfort measures Outcome: Progressing   Problem: Clinical Measurements: Goal: Will remain free from infection Outcome: Progressing   Problem: Clinical Measurements: Goal: Diagnostic test results will improve Outcome: Progressing   Problem: Clinical Measurements: Goal: Respiratory complications will improve Outcome: Progressing   Problem: Clinical Measurements: Goal: Cardiovascular complication will be avoided Outcome: Progressing   Problem: Activity: Goal: Risk for activity intolerance will decrease Outcome: Progressing   Problem: Nutrition: Goal: Adequate nutrition will be maintained Outcome: Progressing   Problem: Nutrition: Goal: Adequate nutrition will be maintained Outcome: Progressing   Problem: Coping: Goal: Level of anxiety will decrease Outcome: Progressing   Problem: Elimination: Goal: Will not experience complications related to bowel motility Outcome: Progressing   Problem: Elimination: Goal: Will not experience complications related to urinary retention Outcome: Progressing   Problem: Pain Managment: Goal: General experience of comfort will improve and/or be controlled Outcome: Progressing   Problem: Elimination: Goal: Will not experience complications related to urinary retention Outcome: Progressing   Problem: Safety: Goal: Ability to remain free from injury will improve Outcome: Progressing   Problem: Skin Integrity: Goal: Risk for impaired skin integrity will decrease Outcome: Progressing

## 2023-09-17 NOTE — Progress Notes (Signed)
 Patient agitated and very jumpy hunched over with the bed alarm going off. CIWA 12, medicated with PO ativan .

## 2023-09-17 NOTE — Progress Notes (Signed)
 Physical Therapy Treatment Patient Details Name: Robert Greene MRN: [id number] DOB: 1960-12-08 Today's Date: 09/17/2023   History of Present Illness Robert Greene is a 63 y.o. 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  Demonstrates extremely high risk for falls with flexed posture, poor foot clearance, and increased distance from walker.  Patient leaning L and needing occasional CGA for prevention of LOB and able to step up on steps only with min A due to LE weakness.  Discussed options for post-acute inpatient inpatient rehab prior to d/c home.  Pt to consider.  PT will follow.    If plan is discharge home, recommend the following: A little help with walking and/or transfers;A little help with bathing/dressing/bathroom;Help with stairs or ramp for entrance;Assist for transportation;Assistance with cooking/housework;Direct supervision/assist for medications management;Supervision due to cognitive status   Can travel by private vehicle     Yes  Equipment Recommendations  Rolling walker (2 wheels)    Recommendations for Other Services       Precautions / Restrictions Precautions Precautions: Fall Recall of Precautions/Restrictions: Impaired     Mobility  Bed Mobility Overal bed mobility: Modified Independent                  Transfers Overall transfer level: Needs assistance Equipment used: Rolling walker (2 wheels) Transfers: Sit to/from Stand Sit to Stand: Contact guard assist           General transfer comment:  increased time, elevated height of bed for improved success    Ambulation/Gait Ambulation/Gait assistance: Contact guard assist, Supervision Gait Distance (Feet): 150 Feet Assistive device: Rolling walker (2 wheels) Gait Pattern/deviations: Step-to pattern, Decreased stride length, Trunk flexed, Wide base of support, Shuffle       General Gait Details: poor foot clearance, flexed posture, increased proximity to walker, occasional CGA due to leftward leaning   Stairs Stairs: Yes Stairs assistance: Min assist Stair Management: One rail Left, Step to pattern, Sideways Number of Stairs: 3 General stair comments: min A for ascending due to leg barely lifting him high enough   Wheelchair Mobility     Tilt Bed    Modified Rankin (Stroke Patients Only)       Balance Overall balance assessment: Needs assistance Sitting-balance support: Feet supported Sitting balance-Leahy Scale: Good     Standing balance support: Bilateral upper extremity supported Standing balance-Leahy Scale: Poor Standing balance comment: UE reliant                            Communication Communication Communication: Impaired Factors Affecting Communication: Reduced clarity of speech (mumbling)  Cognition Arousal: Lethargic Behavior During Therapy: Flat affect   PT - Cognitive impairments: Memory, Attention, Initiation, Problem solving                       PT - Cognition Comments: slow processing, sustained attention, decreased Following commands: Impaired Following commands impaired: Follows one step commands with increased time, Only follows one step commands consistently    Cueing Cueing Techniques: Verbal cues  Exercises  General Comments General comments (skin integrity, edema, etc.): HR 88, SpO2 97% after stair negotiation, reports continued tingling in hands      Pertinent Vitals/Pain Pain Assessment Faces Pain Scale: Hurts little more Pain Location:  back Pain Descriptors / Indicators: Aching, Discomfort, Grimacing Pain Intervention(s): Monitored during session, Repositioned    Home Living                          Prior Function            PT Goals (current goals can now be found in the care plan section) Progress towards PT goals: Not progressing toward goals - comment    Frequency    Min 2X/week      PT Plan      Co-evaluation              AM-PAC PT 6 Clicks Mobility   Outcome Measure  Help needed turning from your back to your side while in a flat bed without using bedrails?: None Help needed moving from lying on your back to sitting on the side of a flat bed without using bedrails?: None Help needed moving to and from a bed to a chair (including a wheelchair)?: A Little Help needed standing up from a chair using your arms (e.g., wheelchair or bedside chair)?: A Little Help needed to walk in hospital room?: A Little Help needed climbing 3-5 steps with a railing? : A Little 6 Click Score: 20    End of Session Equipment Utilized During Treatment: Gait belt Activity Tolerance: Patient limited by lethargy Patient left: with call bell/phone within reach;with bed alarm set;in bed   PT Visit Diagnosis: Muscle weakness (generalized) (M62.81);Difficulty in walking, not elsewhere classified (R26.2)     Time: 8464-8394 PT Time Calculation (min) (ACUTE ONLY): 30 min  Charges:    $Gait Training: 8-22 mins $Therapeutic Activity: 8-22 mins PT General Charges $$ ACUTE PT VISIT: 1 Visit                     Micheline Portal, PT Acute Rehabilitation Services [phone] 09/17/2023    Montie Portal 09/17/2023, 4:11 PM

## 2023-09-17 NOTE — [place] FL2 (Signed)
 [place]  MEDICAID FL2 LEVEL OF CARE FORM     IDENTIFICATION  Patient Name: Robert Greene Birthdate: 02-11-1960 Sex: male Admission Date (Current Location): 09/14/2023  [facility] and [place] Number:  Producer, television/film/video and Address:  [facility],  [address], [place] [address]      Provider Number: [id number]  Attending Physician Name and Address:  Rosario Leatrice FERNS, MD  Relative Name and Phone Number:       Current Level of Care: SNF Recommended Level of Care: Skilled Nursing Facility Prior Approval Number:    Date Approved/Denied:   PASRR Number: [id number]  Discharge Plan: SNF    Current Diagnoses: Patient Active Problem List   Diagnosis Date Noted   Weakness 09/14/2023   Gastritis without bleeding 08/03/2023   Cirrhosis of liver with ascites (HCC) 08/02/2023   Rectal bleeding 08/02/2023   GI bleed 08/01/2023   Hypotension 01/30/2023   Alcohol use disorder, moderate, dependence (HCC) 01/30/2023   Colon cancer screening 05/13/2022   Alcoholic cirrhosis of liver with ascites (HCC) 05/13/2022   Secondary esophageal varices without bleeding (HCC) 11/10/2021   Portal hypertensive gastropathy (HCC) 11/10/2021   Duodenal nodule 11/10/2021   Alcoholic cirrhosis of liver without ascites (HCC) 07/16/2021   Anemia 07/16/2021   History of colon polyps 07/16/2021   Alcohol use disorder, severe, dependence (HCC) 07/16/2021   Atherosclerosis of both carotid arteries 07/07/2019   Essential hypertension 04/08/2018   Coronary artery calcification seen on CAT scan 04/08/2018   Laboratory examination 04/08/2018   Mixed hyperlipidemia 04/08/2018   ILD (interstitial lung disease) (HCC) 12/06/2017    Orientation RESPIRATION BLADDER Height & Weight     Self, Time, Situation, Place  Normal Continent Weight: 90.7 kg Height:  5' 9 (175.3 cm)  BEHAVIORAL SYMPTOMS/MOOD NEUROLOGICAL BOWEL NUTRITION STATUS      Continent Diet  AMBULATORY STATUS  COMMUNICATION OF NEEDS Skin   Limited Assist Verbally Normal                       Personal Care Assistance Level of Assistance  Bathing, Feeding, Dressing Bathing Assistance: Independent Feeding assistance: Independent Dressing Assistance: Independent     Functional Limitations Info             SPECIAL CARE FACTORS FREQUENCY  PT (By licensed PT), OT (By licensed OT)     PT Frequency: 5X weekly OT Frequency: 5X weekly            Contractures Contractures Info: Not present    Additional Factors Info  Code Status, Allergies Code Status Info: Limited Allergies Info: Bee Venom, Spider Antivenin (S Black Widow (Latrodec Mactans) Antivenin)           Current Medications (09/17/2023):  This is the current hospital active medication list Current Facility-Administered Medications  Medication Dose Route Frequency Provider Last Rate Last Admin   carvedilol  (COREG ) tablet 3.125 mg  3.125 mg Oral BID WC Nandigam, Kavitha V, MD   3.125 mg at 09/17/23 1646   folic acid  (FOLVITE ) tablet 1 mg  1 mg Oral Daily Xu, Fang, MD   1 mg at 09/17/23 1031   hydroxychloroquine  (PLAQUENIL ) tablet 200 mg  200 mg Oral Daily Xu, Fang, MD   200 mg at 09/17/23 1031   LORazepam  (ATIVAN ) tablet 1-4 mg  1-4 mg Oral Q1H PRN Xu, Fang, MD   2 mg at 09/17/23 9176   Or   LORazepam  (ATIVAN ) injection 1-4 mg  1-4 mg Intravenous  Q1H PRN Xu, Fang, MD       multivitamin with minerals tablet 1 tablet  1 tablet Oral Daily Jerri Keys, MD   1 tablet at 09/17/23 1030   pantoprazole  (PROTONIX ) EC tablet 40 mg  40 mg Oral Daily Nandigam, Kavitha V, MD   40 mg at 09/17/23 1030   thiamine  (VITAMIN B1) tablet 100 mg  100 mg Oral Daily Jerri Keys, MD   100 mg at 09/17/23 1030   Or   thiamine  (VITAMIN B1) injection 100 mg  100 mg Intravenous Daily Jerri Keys, MD         Discharge Medications: Please see discharge summary for a list of discharge medications.  Relevant Imaging Results:  Relevant Lab  Results:   Additional Information SSN [id number]  Doneta Glenys DASEN, RN

## 2023-09-17 NOTE — Progress Notes (Signed)
 PT Cancellation Note  Patient Details Name: Robert Greene MRN: [id number] DOB: [birth year]   Cancelled Treatment:    Reason Eval/Treat Not Completed: Other (comment). Upon arrival pt sleeping soundly, doesn't waken to knock or loud voice, audible snoring noted. RN reports pt recently received ativan  and is sleeping. Will continue to check back for PT as schedule permits.   Tori Caya Soberanis PT, DPT 09/17/23, 10:00 AM

## 2023-09-18 DIAGNOSIS — R531 Weakness: Secondary | ICD-10-CM | POA: Diagnosis not present

## 2023-09-18 LAB — ACETYLCHOLINE RECEPTOR, BINDING: Acety choline binding ab: <0.07 nmol/L (ref 0.00–0.24)

## 2023-09-18 MED ORDER — DIPHENHYDRAMINE HCL 25 MG PO CAPS
25.0000 mg | ORAL_CAPSULE | Freq: Once | ORAL | Status: AC | PRN
Start: 2023-09-18 — End: 2023-09-18
  Administered 2023-09-18: 25 mg via ORAL
  Filled 2023-09-18: qty 1

## 2023-09-18 MED ORDER — FLUTICASONE PROPIONATE 50 MCG/ACT NA SUSP
1.0000 | Freq: Every day | NASAL | Status: DC
  Administered 2023-09-18 – 2023-09-19 (×2): 1 via NASAL
  Filled 2023-09-18: qty 16

## 2023-09-18 MED ORDER — LACTULOSE 10 GM/15ML PO SOLN
20.0000 g | Freq: Three times a day (TID) | ORAL | Status: DC
  Administered 2023-09-18 (×3): 20 g via ORAL
  Filled 2023-09-18 (×5): qty 30

## 2023-09-18 MED ORDER — RIFAXIMIN 550 MG PO TABS
550.0000 mg | ORAL_TABLET | Freq: Two times a day (BID) | ORAL | Status: DC
  Administered 2023-09-18 – 2023-09-20 (×5): 550 mg via ORAL
  Filled 2023-09-18 (×6): qty 1

## 2023-09-18 NOTE — Plan of Care (Signed)

## 2023-09-18 NOTE — Progress Notes (Signed)
 Physical Therapy Treatment Patient Details Name: Robert Greene MRN: [id number] DOB: [birth year] Today's Date: 09/18/2023   History of Present Illness Robert Greene is a 63 y.o. 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 Mobility and balance are slowly improving. Able to practice gait training with and without RW on today. Worked on balance activities: static standing with EO, EC, narrow BOS, withstanding perturbations. Worked on side stepping and backwards walking. Pt currently requires, at least, 1 point of support, to safely ambulate halls with assistance. He walked a short distance to/from bathroom in room furniture cruising.  Will continue to follow and progress activity as tolerated. Patient will benefit from continued inpatient follow up therapy, <3 hours/day.     If plan is discharge home, recommend the following: A little help with walking and/or transfers;A little help with bathing/dressing/bathroom;Help with stairs or ramp for entrance;Assist for transportation;Assistance with cooking/housework;Direct supervision/assist for medications management;Supervision due to cognitive status   Can travel by private vehicle     Yes  Equipment Recommendations       Recommendations for Other Services       Precautions / Restrictions Precautions Precautions: Fall Recall of Precautions/Restrictions: Impaired Restrictions Weight Bearing Restrictions Per Provider Order: No     Mobility  Bed Mobility Overal bed mobility: Modified Independent                  Transfers Overall  transfer level: Needs assistance Equipment used: Rolling walker (2 wheels) Transfers: Sit to/from Stand Sit to Stand: Contact guard assist           General transfer comment: increased time, elevated height of bed for improved success    Ambulation/Gait Ambulation/Gait assistance: Contact guard assist Gait Distance (Feet): 150 Feet (x2) Assistive device: Rolling walker (2 wheels) (vs hallway handrail) Gait Pattern/deviations: Step-through pattern, Decreased stride length, Decreased weight shift to left       General Gait Details: Slow gait speed. Close guard for safety due to impaired balance. Pt walked x 1 with RW, then x 1 with support of hallway handrail (intermittent short distances without UE support at gaps in railing). Tolerated distance well.   Stairs             Wheelchair Mobility     Tilt Bed    Modified Rankin (Stroke Patients Only)       Balance Overall balance assessment: Needs assistance, History of Falls         Standing balance support: No upper extremity supported, Bilateral upper extremity supported, Single extremity supported, During functional activity Standing balance-Leahy Scale: Fair Standing balance comment: Had pt perform static standing with EO, EC, narrow BOS, withstanding of perturbations-pt unable to achieve each position without 1 hand support.             High level balance activites: Side stepping, Backward walking High Level Balance Comments: Practiced side steps to L and R sides, backwards walking, ~12 feet each.            Communication Communication Communication: No apparent difficulties  Cognition Arousal: Alert Behavior During Therapy: WFL for tasks assessed/performed   PT - Cognitive  impairments: No apparent impairments                         Following commands: Impaired Following commands impaired: Follows one step commands with increased time    Cueing Cueing Techniques: Verbal cues   Exercises      General Comments        Pertinent Vitals/Pain Pain Assessment Pain Assessment: Faces Faces Pain Scale: Hurts a little bit Pain Location: back, neck Pain Descriptors / Indicators: Discomfort Pain Intervention(s): Monitored during session    Home Living                          Prior Function            PT Goals (current goals can now be found in the care plan section) Progress towards PT goals: Progressing toward goals    Frequency    Min 2X/week      PT Plan      Co-evaluation              AM-PAC PT 6 Clicks Mobility   Outcome Measure  Help needed turning from your back to your side while in a flat bed without using bedrails?: None Help needed moving from lying on your back to sitting on the side of a flat bed without using bedrails?: None Help needed moving to and from a bed to a chair (including a wheelchair)?: A Little Help needed standing up from a chair using your arms (e.g., wheelchair or bedside chair)?: A Little Help needed to walk in hospital room?: A Little Help needed climbing 3-5 steps with a railing? : A Little 6 Click Score: 20    End of Session Equipment Utilized During Treatment: Gait belt Activity Tolerance: Patient tolerated treatment well Patient left: in bed;with call bell/phone within reach;with bed alarm set   PT Visit Diagnosis: Muscle weakness (generalized) (M62.81);Difficulty in walking, not elsewhere classified (R26.2)     Time: 8396-8372 PT Time Calculation (min) (ACUTE ONLY): 24 min  Charges:    $Gait Training: 23-37 mins PT General Charges $$ ACUTE PT VISIT: 1 Visit                         Dannial SQUIBB, PT Acute Rehabilitation  Office: [phone]

## 2023-09-18 NOTE — Progress Notes (Signed)
 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  He continued to drinks alcohol last drink was on Wednesday.  He also reports right eyelid drooping for 2 months.  Denies any eye pain or vision changes.  He had low TSH and elevated free T4 suggestive of hyperthyroidism.  He will need to follow-up with endocrinologist and will need thyroid function test in 2 weeks.  GI was consulted underwent endoscopy on 8/24 which revealed grade 1 esophageal varices with no bleeding or no stigmata of recent bleeding.  Portal hypertensive gastropathy.  Duodenum was normal.  Patient was found to have elevation of ammonia, started on lactulose  and rifaximin  on 8/26.  He will need rehab.   Assessment & Plan:   Principal Problem:   Weakness  1-Generalized weakness, fall - In the setting of alcohol use - Counseling provided - Need rehab   Acute on chronic anemia: - Patient presented with a hemoglobin of 8 and thrombocytopenia, weakness. - GI was consulted. -Underwent endoscopy; grade 1 esophageal varices with no bleeding and no stigmata of recent bleeding.  Portal hypertensive gastropathy. Continue PPI  Right eye Ptosis - MRI orbits: Negative - MRI brain chronic small vessels disease.  Intrinsic T1 signal in the globus pallidus are compatible with hepatic insufficiency, cirrhosis -TSH 0.026, no, -Free T4: 1.4. Will check free T3 Will need close follow-up with endocrinologist.  Hypokalemia: - Replaced  Alcoholic liver  cirrhosis with esophageal varices: - Nadolol  changed to carvedilol  per GI recommendation. - Ammonia level elevated.  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   Weight as of this encounter: 90.7 kg.   DVT prophylaxis: SCDs high risk for bleeding Code Status: DNR with intervention Family Communication: No family at bedside Disposition Plan:  Status is: Observation The patient remains OBS appropriate and will d/c before 2 midnights.  Awaiting rehab    Consultants:  GI  Procedures:  Endoscopy   Antimicrobials:    Subjective: He is alert he knows that he is post, was able to tell me the year and who was the president.  He denies He said he had 2 bowel movement Objective: Vitals:   09/17/23 1953 09/18/23 0011 09/18/23 0419 09/18/23 1148  BP: (!) 127/59 (!) 143/80 139/81 137/77  Pulse: 72 78 75 73  Resp: 17 16 17 16   Temp: 98.3 F (36.8 C) 98.5 F (36.9 C) 98.2 F (36.8 C) 98.5 F (36.9 C)  TempSrc: Oral Oral Oral Oral  SpO2: 98% 98% 99% 97%  Weight:      Height:        Intake/Output Summary (Last 24 hours) at 09/18/2023 1233 Last data filed at 09/18/2023 0800 Gross per 24 hour  Intake 840 ml  Output 500 ml  Net 340 ml   Filed Weights   09/14/23 1157  Weight: 90.7 kg  Examination:  General exam: Appears calm and comfortable  Respiratory system: Clear to auscultation. Respiratory effort normal. Cardiovascular system: S1 & S2 heard, RRR. No JVD, murmurs, rubs, gallops or clicks. No pedal edema. Gastrointestinal system: Abdomen is nondistended, soft and nontender. No organomegaly or masses felt. Normal bowel sounds heard. Central nervous system: Alert and  oriented.  Extremities: Symmetric 5 x 5 power.    Data Reviewed: I have personally reviewed following labs and imaging studies  CBC: Recent Labs  Lab 09/14/23 1225 09/15/23 0530 09/16/23 0425 09/17/23 0443  WBC 2.9* 2.3* 3.2* 3.3*  NEUTROABS  --   --  0.9* 1.4*  HGB 8.5* 8.0* 8.5* 8.8*  HCT 27.7* 25.7* 27.2* 27.8*  MCV 82.9 82.9 82.7 82.5  PLT 43* 32* 31* 30*   Basic Metabolic Panel: Recent Labs  Lab 09/14/23 1225 09/15/23 0530 09/15/23 1943 09/16/23 0425 09/17/23 0443  NA 141 138  --  134* 135  K 3.2* 3.4*  --  3.5 3.6  CL 109 109  --  105 109  CO2 21* 21*  --  19* 21*  GLUCOSE 100* 81  --  88 127*  BUN 7* 6*  --  8 10  CREATININE 0.64 0.60*  --  0.68 0.63  CALCIUM  8.4* 8.5*  --  8.9 8.7*  MG  --   --  1.8 1.8  --   PHOS  --   --   --  2.4* 3.0   GFR: Estimated Creatinine Clearance: 105.2 mL/min (by C-G formula based on SCr of 0.63 mg/dL). Liver Function Tests: Recent Labs  Lab 09/14/23 1225 09/15/23 0530 09/16/23 0425 09/17/23 0443  AST 93* 84*  --   --   ALT 34 31  --   --   ALKPHOS 111 107  --   --   BILITOT 1.4* 1.9*  --   --   PROT 6.9 6.1*  --   --   ALBUMIN  3.3* 3.0* 3.1* 3.0*   No results for input(s): LIPASE, AMYLASE in the last 168 hours. Recent Labs  Lab 09/14/23 1622 09/17/23 1745  AMMONIA 34 79*   Coagulation Profile: No results for input(s): INR, PROTIME in the last 168 hours. Cardiac Enzymes: Recent Labs  Lab 09/15/23 0530  CKTOTAL 253   BNP (last 3 results) No results for input(s): PROBNP in the last 8760 hours. HbA1C: No results for input(s): HGBA1C in the last 72 hours. CBG: No results for input(s): GLUCAP in the last 168 hours. Lipid Profile: No results for input(s): CHOL, HDL, LDLCALC, TRIG, CHOLHDL, LDLDIRECT in the last 72 hours. Thyroid Function Tests: Recent Labs    09/15/23 1943 09/17/23 0443  TSH 0.026*  --   FREET4  --  1.40*   Anemia Panel: No results for input(s):  VITAMINB12, FOLATE, FERRITIN, TIBC, IRON, RETICCTPCT in the last 72 hours. Sepsis Labs: No results for input(s): PROCALCITON, LATICACIDVEN in the last 168 hours.  No results found for this or any previous visit (from the past 240 hours).       Radiology Studies: No results found.      Scheduled Meds:  carvedilol   3.125 mg Oral BID WC   fluticasone   1 spray Each Nare Daily   folic acid   1 mg Oral Daily   hydroxychloroquine   200 mg Oral Daily   lactulose   20 g Oral TID   multivitamin with minerals  1 tablet Oral Daily   pantoprazole   40 mg Oral Daily   rifaximin   550 mg  Oral BID   thiamine   100 mg Oral Daily   Or   thiamine   100 mg Intravenous Daily   Continuous Infusions:   LOS: 0 days    Time spent: 35 Minutes    Teasha Murrillo A Montrelle Eddings, MD [facility]   If 7PM-7AM, please contact night-coverage [email or web address]  09/18/2023, 12:33 PM

## 2023-09-18 NOTE — Evaluation (Signed)
 Occupational Therapy Evaluation Patient Details Name: Robert Greene MRN: [id number] DOB: 01-Feb-1960 Today's Date: 09/18/2023   History of Present Illness   KEONTAY VORA is a 63 y.o. 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  Currently, patient presents with deficits outlined below (see OT Problem List for details) most significantly effects of social situation, prolonged bed rest and CIWA precautions, decreased cognition, balance and overall activity tolerance for BADL's and functional mobility performance which is below baseline function and impacting safety. Patient requires continued Acute care hospital level OT services to progress safety and functional performance and allow for discharge. Patient will benefit from continued inpatient follow up therapy, <3 hours/day.  ]      If plan is discharge home, recommend the following:   A little help with walking and/or transfers;A little help with bathing/dressing/bathroom;Assistance with cooking/housework;Direct supervision/assist for medications management;Direct supervision/assist for financial management;Assist for transportation;Help with stairs or ramp for entrance;Supervision due to cognitive status     Functional Status Assessment   Patient has had a recent decline in their functional status and demonstrates the ability to make significant improvements in function in a reasonable and predictable amount of time.     Equipment Recommendations   None  recommended by OT      Precautions/Restrictions   Precautions Precautions: Fall Recall of Precautions/Restrictions: Impaired Restrictions Weight Bearing Restrictions Per Provider Order: No     Mobility Bed Mobility Overal bed mobility: Modified Independent                  Transfers Overall transfer level: Needs assistance Equipment used: Rolling walker (2 wheels) Transfers: Sit to/from Stand Sit to Stand: Contact guard assist           General transfer comment: increased time, elevated height of bed for improved success      Balance Overall balance assessment: Needs assistance Sitting-balance support: Feet supported Sitting balance-Leahy Scale: Good     Standing balance support: Bilateral upper extremity supported Standing balance-Leahy Scale: Poor Standing balance comment: UE reliant                           ADL either performed or assessed with clinical judgement   ADL Overall ADL's : Needs assistance/impaired Eating/Feeding: Modified independent   Grooming: Wash/dry hands;Wash/dry face;Oral care;Applying deodorant;Brushing hair;Modified independent;Sitting   Upper Body Bathing: Set up;Sitting   Lower Body Bathing: Minimal assistance;Sit to/from stand   Upper Body Dressing : Contact guard assist;Sitting   Lower Body Dressing: Minimal assistance;Sit to/from stand;Cueing for safety   Toilet Transfer: Minimal assistance;Ambulation;Regular Teacher, adult education Details (indicate cue type and reason): min cues for powering up from lower seat Toileting- Clothing Manipulation and Hygiene: Contact guard assist;Sit to/from stand   Tub/ Shower Transfer: Minimal assistance;Ambulation;Rolling walker (2 wheels);Shower seat   Functional mobility during ADLs: Minimal assistance;Rolling walker (2 wheels) (due to use of regular height toilet) General ADL Comments: min-mod cues for higher level sequencing, safety and insight     Vision  Baseline Vision/History: 0 No visual  deficits;1 Wears glasses Ability to See in Adequate Light: 0 Adequate Patient Visual Report: No change from baseline Vision Assessment?: Wears glasses for reading;No apparent visual deficits            Pertinent Vitals/Pain Pain Assessment Pain Assessment: Faces Faces Pain Scale: No hurt     Extremity/Trunk Assessment Upper Extremity Assessment Upper Extremity Assessment: Right hand dominant;Overall [facility] for tasks assessed   Lower Extremity Assessment Lower Extremity Assessment: Generalized weakness       Communication Communication Communication: Impaired Factors Affecting Communication: Reduced clarity of speech (mumbling)   Cognition Arousal: Alert Behavior During Therapy: Flat affect Cognition: Cognition impaired   Orientation impairments: Time Awareness: Online awareness impaired Memory impairment (select all impairments): Short-term memory Attention impairment (select first level of impairment): Selective attention Executive functioning impairment (select all impairments): Reasoning, Problem solving, Sequencing OT - Cognition Comments: decreased insight and judgement                 Following commands: Impaired Following commands impaired: Follows one step commands with increased time, Only follows one step commands consistently     Cueing  General Comments   Cueing Techniques: Verbal cues  able to tolerate amb and standing level bathroom activity with no SOB           Home Living Family/patient expects to be discharged to:: Private residence Living Arrangements: Alone Available Help at Discharge: Family;Available PRN/intermittently Type of Home: House Home Access: Stairs to enter [employer] of Steps: 3 Entrance Stairs-Rails: Right Home Layout: One level     Bathroom Shower/Tub: Walk-in Pensions consultant: Standard Bathroom Accessibility: Yes How Accessible: Accessible via  walker Home Equipment: Educational psychologist (4 wheels);Cane - single point;Grab bars - tub/shower          Prior Functioning/Environment Prior Level of Function : Independent/Modified Independent;History of Falls (last six months)             Mobility Comments: Pt reports independent with mobility and not using AD recently.  Does report furniture surfing or at least stabilizing upon standing.  He does report low energy and several recent falls. Pt not able to state what caused his falls- does seems like fall at admission due to syncope, reports has fallen on stairs.  Asked about lightheadedness or dizziness and he stated not really , more imbalance ADLs Comments: independent with adls and light iadls; again reports low energy recently    OT Problem List: Decreased activity tolerance;Impaired balance (sitting and/or standing);Decreased cognition;Decreased safety awareness   OT Treatment/Interventions: Self-care/ADL training;Therapeutic exercise;Neuromuscular education;Energy conservation;DME and/or AE instruction;Therapeutic activities;Cognitive remediation/compensation;Balance training;Patient/family education      OT Goals(Current goals can be found in the care plan section)   Acute Rehab OT Goals Patient Stated Goal: to get home OT Goal Formulation: With patient Time For Goal Achievement: 10/02/23 Potential to Achieve Goals: Good ADL Goals Pt Will Perform Lower Body Bathing: with modified independence;sit to/from stand Pt Will Perform Lower Body Dressing: sit to/from stand;with modified independence Pt Will Transfer to Toilet: with modified independence;regular height toilet;ambulating Pt Will Perform Toileting - Clothing Manipulation and hygiene: with modified independence;sit to/from stand Pt Will Perform Tub/Shower Transfer: with supervision;shower seat;rolling walker;Shower transfer   OT Frequency:  Min 2X/week       AM-PAC OT 6 Clicks Daily Activity     Outcome  Measure Help from another person eating meals?: None Help from another person taking care of personal grooming?: None Help from another person toileting, which includes  using toliet, bedpan, or urinal?: A Little Help from another person bathing (including washing, rinsing, drying)?: A Little Help from another person to put on and taking off regular upper body clothing?: A Little Help from another person to put on and taking off regular lower body clothing?: A Little 6 Click Score: 20   End of Session Equipment Utilized During Treatment: Gait belt;Rolling walker (2 wheels) Nurse Communication: Mobility status  Activity Tolerance: Patient tolerated treatment well Patient left: in bed;with call bell/phone within reach;with bed alarm set  OT Visit Diagnosis: Unsteadiness on feet (R26.81);History of falling (Z91.81);Cognitive communication deficit (R41.841) Symptoms and signs involving cognitive functions: Other cerebrovascular disease                Time: 9153-9088 OT Time Calculation (min): 25 min Charges:  OT General Charges $OT Visit: 1 Visit OT Evaluation $OT Eval Low Complexity: 1 Low OT Treatments $Self Care/Home Management : 8-22 mins Cristal Qadir OT/L Acute Rehabilitation Department  [phone]  09/18/2023, 11:15 AM

## 2023-09-18 NOTE — TOC Progression Note (Addendum)
 Transition of Care [facility]) - Progression Note    Patient Details  Name: Robert Greene MRN: [id number] Date of Birth: July 15, 1960  [facility] CM/SW Contact  Doneta Glenys DASEN, RN Phone Number: 09/18/2023, 10:15 AM  Clinical Narrative:    CM presented patient with bed offers and he choose [facility].  CM will start insurance auth. Hulan barrows started Transaction ID: [id number] Customer ID: [id number]  Transaction Date: 2023-09-18 Patient Member ID [id number] Reference # [id number] 3:41 PM Insurance barrows still pending 4:34 PM CM received call from [name] [phone] requesting update and statues of SNF.   Expected Discharge Plan: Skilled Nursing Facility Barriers to Discharge: Continued Medical Work up, SNF Pending bed offer               Expected Discharge Plan and Services In-house Referral: NA Discharge Planning Services: CM Consult Post Acute Care Choice: NA Living arrangements for the past 2 months: Single Family Home Expected Discharge Date: 09/17/23               DME Arranged: N/A DME Agency: NA       HH Arranged: NA HH Agency: NA         Social Drivers of Health (SDOH) Interventions SDOH Screenings   Food Insecurity: No Food Insecurity (09/14/2023)  Housing: Low Risk  (09/14/2023)  Transportation Needs: No Transportation Needs (09/14/2023)  Utilities: Not At Risk (09/14/2023)  Tobacco Use: High Risk (09/16/2023)    Readmission Risk Interventions     No data to display

## 2023-09-19 ENCOUNTER — Encounter: Payer: Self-pay | Admitting: Gastroenterology

## 2023-09-19 DIAGNOSIS — K766 Portal hypertension: Secondary | ICD-10-CM

## 2023-09-19 DIAGNOSIS — D61818 Other pancytopenia: Secondary | ICD-10-CM

## 2023-09-19 DIAGNOSIS — I8510 Secondary esophageal varices without bleeding: Secondary | ICD-10-CM | POA: Diagnosis not present

## 2023-09-19 DIAGNOSIS — J849 Interstitial pulmonary disease, unspecified: Secondary | ICD-10-CM

## 2023-09-19 DIAGNOSIS — D649 Anemia, unspecified: Secondary | ICD-10-CM

## 2023-09-19 DIAGNOSIS — F1020 Alcohol dependence, uncomplicated: Secondary | ICD-10-CM | POA: Diagnosis not present

## 2023-09-19 DIAGNOSIS — K3189 Other diseases of stomach and duodenum: Secondary | ICD-10-CM | POA: Diagnosis not present

## 2023-09-19 DIAGNOSIS — E876 Hypokalemia: Secondary | ICD-10-CM

## 2023-09-19 DIAGNOSIS — K7030 Alcoholic cirrhosis of liver without ascites: Secondary | ICD-10-CM | POA: Diagnosis not present

## 2023-09-19 DIAGNOSIS — R531 Weakness: Secondary | ICD-10-CM | POA: Diagnosis not present

## 2023-09-19 LAB — CBC
HCT: 29.2 % — ABNORMAL LOW (ref 39.0–52.0)
Hemoglobin: 9.1 g/dL — ABNORMAL LOW (ref 13.0–17.0)
MCH: 25.9 pg — ABNORMAL LOW (ref 26.0–34.0)
MCHC: 31.2 g/dL (ref 30.0–36.0)
MCV: 83 fL (ref 80.0–100.0)
Platelets: 42 K/uL — ABNORMAL LOW (ref 150–400)
RBC: 3.52 MIL/uL — ABNORMAL LOW (ref 4.22–5.81)
RDW: 15.9 % — ABNORMAL HIGH (ref 11.5–15.5)
WBC: 3.7 K/uL — ABNORMAL LOW (ref 4.0–10.5)
nRBC: 0 % (ref 0.0–0.2)

## 2023-09-19 LAB — HEPATIC FUNCTION PANEL
ALT: 35 U/L (ref 0–44)
AST: 59 U/L — ABNORMAL HIGH (ref 15–41)
Albumin: 3.5 g/dL (ref 3.5–5.0)
Alkaline Phosphatase: 123 U/L (ref 38–126)
Bilirubin, Direct: 0.7 mg/dL — ABNORMAL HIGH (ref 0.0–0.2)
Indirect Bilirubin: 0.7 mg/dL (ref 0.3–0.9)
Total Bilirubin: 1.4 mg/dL — ABNORMAL HIGH (ref 0.0–1.2)
Total Protein: 6.4 g/dL — ABNORMAL LOW (ref 6.5–8.1)

## 2023-09-19 LAB — BASIC METABOLIC PANEL WITH GFR
Anion gap: 12 (ref 5–15)
BUN: 11 mg/dL (ref 8–23)
CO2: 19 mmol/L — ABNORMAL LOW (ref 22–32)
Calcium: 9.2 mg/dL (ref 8.9–10.3)
Chloride: 109 mmol/L (ref 98–111)
Creatinine, Ser: 0.68 mg/dL (ref 0.61–1.24)
GFR, Estimated: >60 mL/min (ref >60)
Glucose, Bld: 104 mg/dL — ABNORMAL HIGH (ref 70–99)
Potassium: 4 mmol/L (ref 3.5–5.1)
Sodium: 139 mmol/L (ref 135–145)

## 2023-09-19 LAB — AMMONIA: Ammonia: 70 umol/L — ABNORMAL HIGH (ref 9–35)

## 2023-09-19 NOTE — Progress Notes (Signed)
 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  He continued to drinks alcohol last drink was on Wednesday.  He also reports right eyelid drooping for 2 months.  Denies any eye pain or vision changes.  He had low TSH and elevated free T4 suggestive of hyperthyroidism.  He will need to follow-up with endocrinologist and will need thyroid function test in 2 weeks.   GI was consulted underwent endoscopy on 8/24 which revealed grade 1 esophageal varices with no bleeding or no stigmata of recent bleeding.  Portal hypertensive gastropathy.  Duodenum was normal.   Patient was found to have elevation of ammonia, started on lactulose  and rifaximin  on 8/26.  He will need rehab.   Assessment & Plan:   Principal Problem:   Weakness Active Problems:   ILD (interstitial lung disease) (HCC)   Alcoholic cirrhosis of liver without ascites (HCC)   Anemia   Secondary esophageal varices without bleeding (HCC)   Portal hypertensive gastropathy (HCC)   Alcoholic cirrhosis of liver with ascites (HCC)   Alcohol use disorder, moderate, dependence (HCC)   Hypokalemia   Pancytopenia (HCC)   #1 generalized weakness/fall -Likely secondary to debility in the setting of alcohol use. -Alcohol cessation stressed to patient. - Patient seen by PT/OT who are recommending SNF.  2.  Acute on chronic anemia -Patient noted to have presented with a hemoglobin of 8 and noted to be  thrombocytopenic with generalized weakness. - Patient seen in consultation by GI and underwent upper endoscopy that showed grade 1 esophageal varices with no bleeding or no stigmata of recent bleeding, portal hypertensive gastropathy noted - GI recommended switching nadolol  to carvedilol  3.125 mg twice daily for esophageal varices bleeding prophylaxis, continuation of PPI 40 mg daily, lactulose  10 g twice daily with goal of 2-3 soft bowel movements per day, alcohol cessation. - Hemoglobin currently stable at 9.1. - Follow-up.  3.  Pancytopenia -Likely secondary to chronic alcohol use. - Patient with no overt bleeding. - Follow-up.  4.  Right eye ptosis -MRI of orbits negative. - MRI brain with chronic small vessel disease.  Intrinsic T1 signal in the globus pallidus are compatible with hepatic insufficiency, cirrhosis. - TSH of 0.026. - Free T4 of 1.4. - Outpatient follow-up.  5.  Hypokalemia - Repleted.  6.  Alcoholic liver cirrhosis with esophageal varices -Currently stable. - Patient seen by GI underwent upper endoscopy that noted grade 1 esophageal varices with no bleeding or recent bleeding, portal hypertensive gastropathy. - Patient changed from nadolol  to carvedilol  per GI recommendations. - Patient started on lactulose  and Xifaxan . - Outpatient follow-up with primary gastroenterologist.  7.  Alcohol use -Currently stable with no significant withdrawal symptoms noted. - Status post completion of the Ativan  CIWA withdrawal protocol. - Continue thiamine , folic acid , multivitamin. - Alcohol cessation stressed to patient.  8.  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 - Continue Enbrel injection weekly.  DVT prophylaxis: SCDs Code Status: DNR Family Communication: Updated patient.  No family at bedside. Disposition: SNF when medically stable hopefully in  the next 24 to 48 hours.  Status is: Observation The patient remains OBS  appropriate and will d/c before 2 midnights.   Consultants:  Gastroenterology: Dr. Shila 09/14/2023  Procedures:  CT head without contrast 09/14/2023 Chest x-ray 09/14/2023 MRI brain/MRI orbits 09/15/2023 Upper endoscopy 09/16/2023 per Dr. Shila  Antimicrobials:  Anti-infectives (From admission, onward)    Start     Dose/Rate Route Frequency Ordered Stop   09/18/23 1200  rifaximin  (XIFAXAN ) tablet 550 mg        550 mg Oral 2 times daily 09/18/23 1101     09/15/23 1000  hydroxychloroquine  (PLAQUENIL ) tablet 200 mg        200 mg Oral Daily 09/14/23 1825           Subjective: Patient sitting up in bed eating lunch.  Overall feeling much better than he did on admission.  States tremors has improved.  Denies any chest pain or shortness of breath.  Denies any abdominal pain.  States had 4 bowel movements this morning  Objective: Vitals:   09/18/23 1947 09/19/23 0453 09/19/23 1207 09/19/23 1936  BP: (!) 152/80 (!) 144/92 118/80 120/78  Pulse: 77 68 75 74  Resp: 18 18 (!) 22 17  Temp: 98.1 F (36.7 C) 98.3 F (36.8 C) 98.4 F (36.9 C) 98.4 F (36.9 C)  TempSrc:   Oral Oral  SpO2: 100% 98% 99% 100%  Weight:      Height:        Intake/Output Summary (Last 24 hours) at 09/19/2023 2127 Last data filed at 09/19/2023 1300 Gross per 24 hour  Intake 1800 ml  Output --  Net 1800 ml   Filed Weights   09/14/23 1157  Weight: 90.7 kg    Examination:  General exam: Appears calm and comfortable  Respiratory system: Clear to auscultation.  No wheezes, no crackles, no rhonchi.  Fair air movement.  Speaking in full sentences.  Respiratory effort normal. Cardiovascular system: S1 & S2 heard, RRR. No JVD, murmurs, rubs, gallops or clicks. No pedal edema. Gastrointestinal system: Abdomen is nondistended, soft and nontender. No organomegaly or masses felt. Normal bowel sounds heard. Central nervous system: Alert and oriented. No focal neurological deficits. Extremities: Symmetric 5 x  5 power. Skin: No rashes, lesions or ulcers Psychiatry: Judgement and insight appear normal. Mood & affect appropriate.     Data Reviewed: I have personally reviewed following labs and imaging studies  CBC: Recent Labs  Lab 09/14/23 1225 09/15/23 0530 09/16/23 0425 09/17/23 0443 09/19/23 0445  WBC 2.9* 2.3* 3.2* 3.3* 3.7*  NEUTROABS  --   --  0.9* 1.4*  --   HGB 8.5* 8.0* 8.5* 8.8* 9.1*  HCT 27.7* 25.7* 27.2* 27.8* 29.2*  MCV 82.9 82.9 82.7 82.5 83.0  PLT 43* 32* 31* 30* 42*    Basic Metabolic Panel: Recent Labs  Lab 09/14/23 1225 09/15/23 0530 09/15/23 1943 09/16/23 0425 09/17/23 0443 09/19/23 0445  NA 141 138  --  134* 135 139  K 3.2* 3.4*  --  3.5 3.6 4.0  CL 109 109  --  105 109 109  CO2 21* 21*  --  19* 21* 19*  GLUCOSE 100* 81  --  88 127* 104*  BUN 7* 6*  --  8 10 11   CREATININE 0.64 0.60*  --  0.68 0.63 0.68  CALCIUM  8.4* 8.5*  --  8.9 8.7* 9.2  MG  --   --  1.8 1.8  --   --   PHOS  --   --   --  2.4* 3.0  --     GFR: Estimated Creatinine Clearance: 105.2 mL/min (by C-G formula based on SCr of 0.68 mg/dL).  Liver Function Tests: Recent Labs  Lab 09/14/23 1225 09/15/23 0530 09/16/23 0425 09/17/23 0443 09/19/23 0445  AST 93* 84*  --   --  59*  ALT 34 31  --   --  35  ALKPHOS 111 107  --   --  123  BILITOT 1.4* 1.9*  --   --  1.4*  PROT 6.9 6.1*  --   --  6.4*  ALBUMIN  3.3* 3.0* 3.1* 3.0* 3.5    CBG: No results for input(s): GLUCAP in the last 168 hours.   No results found for this or any previous visit (from the past 240 hours).       Radiology Studies: No results found.      Scheduled Meds:  carvedilol   3.125 mg Oral BID WC   fluticasone   1 spray Each Nare Daily   folic acid   1 mg Oral Daily   hydroxychloroquine   200 mg Oral Daily   lactulose   20 g Oral TID   multivitamin with minerals  1 tablet Oral Daily   pantoprazole   40 mg Oral Daily   rifaximin   550 mg Oral BID   thiamine   100 mg Oral Daily   Or   thiamine   100  mg Intravenous Daily   Continuous Infusions:   LOS: 0 days    Time spent: 40 minutes    Toribio Hummer, MD [facility]   To contact the attending provider between 7A-7P or the covering provider during after hours 7P-7A, please log into the web site [email or web address] and access using universal [place] password for that web site. If you do not have the password, please call the hospital operator.  09/19/2023, 9:27 PM

## 2023-09-19 NOTE — TOC Progression Note (Addendum)
 Transition of Care [facility]) - Progression Note    Patient Details  Name: Robert Greene MRN: [id number] Date of Birth: [birth year]  [facility]) CM/SW Contact  Doneta Glenys DASEN, RN Phone Number: 09/19/2023, 9:28 AM  Clinical Narrative:    Insurance auth still pending at 21 hours. 4:14 PM Insurance auth still pending. Patients son updated.   Expected Discharge Plan: Skilled Nursing Facility Barriers to Discharge: Continued Medical Work up, SNF Pending bed offer   Expected Discharge Plan and Services In-house Referral: NA Discharge Planning Services: CM Consult Post Acute Care Choice: NA Living arrangements for the past 2 months: Single Family Home Expected Discharge Date: 09/17/23               DME Arranged: N/A DME Agency: NA       HH Arranged: NA HH Agency: NA         Social Drivers of Health (SDOH) Interventions SDOH Screenings   Food Insecurity: No Food Insecurity (09/14/2023)  Housing: Low Risk  (09/14/2023)  Transportation Needs: No Transportation Needs (09/14/2023)  Utilities: Not At Risk (09/14/2023)  Tobacco Use: High Risk (09/16/2023)    Readmission Risk Interventions     No data to display

## 2023-09-19 NOTE — Plan of Care (Signed)
  Problem: Education: Goal: Knowledge of General Education information will improve Description: Including pain rating scale, medication(s)/side effects and non-pharmacologic comfort measures Outcome: Progressing   Problem: Clinical Measurements: Goal: Ability to maintain clinical measurements within normal limits will improve Outcome: Progressing Goal: Will remain free from infection Outcome: Progressing   Problem: Activity: Goal: Risk for activity intolerance will decrease Outcome: Progressing   Problem: Nutrition: Goal: Adequate nutrition will be maintained Outcome: Progressing   Problem: Elimination: Goal: Will not experience complications related to bowel motility Outcome: Progressing

## 2023-09-19 NOTE — Plan of Care (Signed)

## 2023-09-20 DIAGNOSIS — K7030 Alcoholic cirrhosis of liver without ascites: Secondary | ICD-10-CM | POA: Diagnosis not present

## 2023-09-20 DIAGNOSIS — E876 Hypokalemia: Secondary | ICD-10-CM | POA: Diagnosis not present

## 2023-09-20 DIAGNOSIS — R296 Repeated falls: Secondary | ICD-10-CM

## 2023-09-20 DIAGNOSIS — K3189 Other diseases of stomach and duodenum: Secondary | ICD-10-CM | POA: Diagnosis not present

## 2023-09-20 DIAGNOSIS — D649 Anemia, unspecified: Secondary | ICD-10-CM | POA: Diagnosis not present

## 2023-09-20 DIAGNOSIS — F1020 Alcohol dependence, uncomplicated: Secondary | ICD-10-CM | POA: Diagnosis not present

## 2023-09-20 DIAGNOSIS — K766 Portal hypertension: Secondary | ICD-10-CM | POA: Diagnosis not present

## 2023-09-20 DIAGNOSIS — R531 Weakness: Secondary | ICD-10-CM | POA: Diagnosis not present

## 2023-09-20 DIAGNOSIS — D61818 Other pancytopenia: Secondary | ICD-10-CM | POA: Diagnosis not present

## 2023-09-20 DIAGNOSIS — I8510 Secondary esophageal varices without bleeding: Secondary | ICD-10-CM | POA: Diagnosis not present

## 2023-09-20 LAB — CBC WITH DIFFERENTIAL/PLATELET
Abs Immature Granulocytes: 0.01 K/uL (ref 0.00–0.07)
Basophils Absolute: 0 K/uL (ref 0.0–0.1)
Basophils Relative: 0 %
Eosinophils Absolute: 0.1 K/uL (ref 0.0–0.5)
Eosinophils Relative: 3 %
HCT: 32.6 % — ABNORMAL LOW (ref 39.0–52.0)
Hemoglobin: 9.8 g/dL — ABNORMAL LOW (ref 13.0–17.0)
Immature Granulocytes: 0 %
Lymphocytes Relative: 29 %
Lymphs Abs: 1.1 K/uL (ref 0.7–4.0)
MCH: 24.9 pg — ABNORMAL LOW (ref 26.0–34.0)
MCHC: 30.1 g/dL (ref 30.0–36.0)
MCV: 82.7 fL (ref 80.0–100.0)
Monocytes Absolute: 0.7 K/uL (ref 0.1–1.0)
Monocytes Relative: 19 %
Neutro Abs: 1.8 K/uL (ref 1.7–7.7)
Neutrophils Relative %: 49 %
Platelets: 50 K/uL — ABNORMAL LOW (ref 150–400)
RBC: 3.94 MIL/uL — ABNORMAL LOW (ref 4.22–5.81)
RDW: 15.7 % — ABNORMAL HIGH (ref 11.5–15.5)
WBC: 3.7 K/uL — ABNORMAL LOW (ref 4.0–10.5)
nRBC: 0 % (ref 0.0–0.2)

## 2023-09-20 LAB — BASIC METABOLIC PANEL WITH GFR
Anion gap: 11 (ref 5–15)
BUN: 12 mg/dL (ref 8–23)
CO2: 19 mmol/L — ABNORMAL LOW (ref 22–32)
Calcium: 9.2 mg/dL (ref 8.9–10.3)
Chloride: 107 mmol/L (ref 98–111)
Creatinine, Ser: 0.64 mg/dL (ref 0.61–1.24)
GFR, Estimated: >60 mL/min (ref >60)
Glucose, Bld: 102 mg/dL — ABNORMAL HIGH (ref 70–99)
Potassium: 3.7 mmol/L (ref 3.5–5.1)
Sodium: 138 mmol/L (ref 135–145)

## 2023-09-20 LAB — MAGNESIUM: Magnesium: 2.1 mg/dL (ref 1.7–2.4)

## 2023-09-20 LAB — T3, FREE: T3, Free: 2.9 pg/mL (ref 2.0–4.4)

## 2023-09-20 MED ORDER — RIFAXIMIN 550 MG PO TABS: 550.0000 mg | ORAL_TABLET | Freq: Two times a day (BID) | ORAL | 1 refills | Status: DC

## 2023-09-20 MED ORDER — ADULT MULTIVITAMIN W/MINERALS CH: 1.0000 | ORAL_TABLET | Freq: Every day | ORAL | Status: DC

## 2023-09-20 MED ORDER — FUROSEMIDE 20 MG PO TABS: 20.0000 mg | ORAL_TABLET | Freq: Every day | ORAL | 0 refills | Status: DC | PRN

## 2023-09-20 MED ORDER — LACTULOSE 10 GM/15ML PO SOLN: 20.0000 g | Freq: Every day | ORAL | 1 refills | Status: DC | PRN

## 2023-09-20 NOTE — Progress Notes (Signed)
 Pt functionally improved since initial eval. Plan to d/c home today with HHPT services.  09/20/23 1400  PT Visit Information  Assistance Needed +1  History of Present Illness Robert Greene is a 63 y.o. 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 with weekly Enbrel injections , HTN, HLD, CAD , recent hospitalization in 07/2023 for GI  bleed s/p EGD/ colonoscopy returned to the ED due to weakness, falls numbness tingling in bilateral hands  Subjective Data  Subjective I'm not going to rehab  Patient Stated Goal improve activity tolerance and return home  Precautions  Precautions Fall  Recall of Precautions/Restrictions Impaired  Restrictions  Weight Bearing Restrictions Per Provider Order No  Pain Assessment  Pain Assessment No/denies pain  Cognition  Arousal Alert  Behavior During Therapy Impulsive  Following Commands  Following commands Impaired  Following commands impaired Follows one step commands with increased time  Cueing  Cueing Techniques Verbal cues  Communication  Communication No apparent difficulties  Bed Mobility  Overal bed mobility Modified Independent  Transfers  Overall transfer level Needs assistance  Equipment used Rolling walker (2 wheels)  Transfers Sit to/from Stand;Bed to chair/wheelchair/BSC  Sit to Stand Supervision  General transfer comment impulsive and cues to consistently use RW and maintain safety strategies for falls prevention  Ambulation/Gait  Ambulation/Gait assistance Supervision  Gait Distance (Feet) 85 Feet  Assistive device Rolling walker (2 wheels)  Gait Pattern/deviations Step-through pattern;Decreased stride length;Decreased weight shift to left  General Gait Details  (No LOB, fall risk due to baseline poor safety awareness and judgement)  Gait velocity dec  Balance  Overall  balance assessment Needs assistance;History of Falls  Sitting-balance support Feet supported  Sitting balance-Leahy Scale Good  Standing balance support During functional activity;Bilateral upper extremity supported;Reliant on assistive device for balance  Standing balance-Leahy Scale Fair  Standing balance comment  (safer with RW support while ambulating)  General Comments  General comments (skin integrity, edema, etc.)  (Pt educated on imporance of using RW, reducing fall risk, taking time to complete tasks)  PT - End of Session  Equipment Utilized During Treatment Gait belt  Activity Tolerance Patient tolerated treatment well  Patient left in bed;with call bell/phone within reach;with bed alarm set  Nurse Communication Mobility status   PT - Assessment/Plan  PT Visit Diagnosis Muscle weakness (generalized) (M62.81);Difficulty in walking, not elsewhere classified (R26.2)  PT Frequency (ACUTE ONLY) Min 2X/week  Follow Up Recommendations Skilled nursing-short term rehab (<3 hours/day) (Pt declining STR, currently too high functioning since initial eval)  Can patient physically be transported by private vehicle Yes  Patient can return home with the following A little help with walking and/or transfers;A little help with bathing/dressing/bathroom;Help with stairs or ramp for entrance;Assist for transportation;Assistance with cooking/housework;Direct supervision/assist for medications management;Supervision due to cognitive status  PT equipment None recommended by PT  AM-PAC PT 6 Clicks Mobility Outcome Measure (Version 2)  Help needed turning from your back to your side while in a flat bed without using bedrails? 4  Help needed moving from lying on your back to sitting on the side of a flat bed without using bedrails? 4  Help needed moving to and from a bed to a chair (including a wheelchair)? 4  Help needed standing up from a chair using your arms (e.g., wheelchair or bedside chair)? 4   Help needed to walk in hospital room? 3  Help  needed climbing 3-5 steps with a railing?  3  6 Click Score 22  Consider Recommendation of Discharge To: Home with no services  Progressive Mobility  What is the highest level of mobility based on the mobility assessment? Level 4 (Ambulates with assistance) - Balance while stepping forward/back - Complete  Activity Ambulated with assistance  PT Goal Progression  Progress towards PT goals Progressing toward goals  PT Time Calculation  PT Start Time (ACUTE ONLY) 1414  PT Stop Time (ACUTE ONLY) 1430  PT Time Calculation (min) (ACUTE ONLY) 16 min  PT General Charges  $$ ACUTE PT VISIT 1 Visit  PT Treatments  $Therapeutic Activity 8-22 mins   Darice Bohr, PTA

## 2023-09-20 NOTE — Discharge Summary (Signed)
 Physician Discharge Summary  Robert Greene FMW:[id number] DOB: 16-Mar-1960 DOA: 09/14/2023  PCP: [name], MD  Admit date: 09/14/2023 Discharge date: 09/20/2023  Time spent: 60 minutes  Recommendations for Outpatient Follow-up:  Follow-up with [name], MD in 2 weeks.  On follow-up patient will need a comprehensive metabolic profile done to follow-up on electrolytes and renal function.  Patient will need a magnesium  level done.  Patient also need a CBC done to follow-up on counts. Follow-up with Dr. Wilhelmenia, gastroenterology in 4 weeks.   Discharge Diagnoses:  Principal Problem:   Weakness Active Problems:   ILD (interstitial lung disease) (HCC)   Alcoholic cirrhosis of liver without ascites (HCC)   Anemia   Secondary esophageal varices without bleeding (HCC)   Portal hypertensive gastropathy (HCC)   Alcoholic cirrhosis of liver with ascites (HCC)   Alcohol use disorder, moderate, dependence (HCC)   Hypokalemia   Pancytopenia (HCC)   Falls frequently   Discharge Condition: Stable and improved.  Diet recommendation: Heart healthy  Filed Weights   09/14/23 1157  Weight: 90.7 kg    History of present illness:  HPI per Dr. Jerri Josefa KEAGHAN Greene is a 63 y.o. 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 Denies eye pain or acute vision changes. He reports had diarrhea today, denies blood in stool, reports it is yellow, no n/v. No fever, reports chronic epigastric dull pain, no new pain.      ED Course:    Data reviewed: Blood pressure  137/72, pulse (!) 101, temperature 98 F (36.7 C), temperature source Oral, resp. rate 18, height 5' 9 (1.753 m), weight 90.7 kg, SpO2 98%.   GI consulted who recommend will consult and hospitalist to admit.    Medications Ordered in the ED - No data to display  Hospital Course:  #1 generalized weakness/fall -Likely secondary to debility in the setting of alcohol use. -Alcohol cessation stressed to patient. - Patient seen by PT/OT who initially recommended SNF.   - As patient improved clinically he felt his strength was adequate enough and refused SNF placement.   - Patient will be discharged in stable and improved condition.   - Outpatient follow-up with PCP.     2.  Acute on chronic anemia -Patient noted to have presented with a hemoglobin of 8 and noted to be thrombocytopenic with generalized weakness. - Patient seen in consultation by GI and underwent upper endoscopy that showed grade 1 esophageal varices with no bleeding or no stigmata of recent bleeding, portal hypertensive gastropathy noted - GI recommended switching nadolol  to carvedilol  3.125 mg twice daily for esophageal varices bleeding prophylaxis, continuation of PPI 40 mg daily, lactulose  10 g twice daily with goal of 2-3 soft bowel movements per day, alcohol cessation. - Patient's hemoglobin remained stable and was 9.8 by day of discharge. - Outpatient follow-up with PCP and primary gastroenterologist.    3.  Pancytopenia -Likely secondary to chronic alcohol use. - Patient with no overt bleeding. -Counts slowly improved during the hospitalization. -Outpatient follow-up with PCP.   4.  Right eye ptosis -MRI of orbits negative. - MRI brain with chronic  small vessel disease.  Intrinsic T1 signal in the globus pallidus are compatible with hepatic insufficiency, cirrhosis. - TSH of 0.026. - Free T4 of 1.4. - Outpatient follow-up.   5.  Hypokalemia - Repleted during her hospitalization.   6.  Alcoholic liver cirrhosis  with esophageal varices - Patient seen by GI underwent upper endoscopy that noted grade 1 esophageal varices with no bleeding or recent bleeding, portal hypertensive gastropathy. - Patient changed from nadolol  to carvedilol  per GI recommendations. - Patient started on lactulose  and Xifaxan . - Outpatient follow-up with primary gastroenterologist.   7.  Alcohol use - Patient on admission was placed on Ativan  CIWA withdrawal protocol, thiamine , folic acid  and multivitamin.   - Patient completed the Ativan  CIWA protocol with clinical improvement and had no further withdrawal symptoms or tremors by day of discharge.   - Alcohol cessation stressed to patient.   - Patient will be discharged in stable and improved condition.   - Outpatient follow-up with PCP.    8.  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 - Patient's weekly Enbrel was held and will be resumed on discharge.     Procedures: CT head without contrast 09/14/2023 Chest x-ray 09/14/2023 MRI brain/MRI orbits 09/15/2023 Upper endoscopy 09/16/2023 per Dr. Shila    Consultations: Gastroenterology: Dr. Shila 09/14/2023    Discharge Exam: Vitals:   09/20/23 0353 09/20/23 1222  BP: (!) 137/90 (!) 144/95  Pulse: 67 63  Resp: 17 18  Temp: 98.6 F (37 C) 98.1 F (36.7 C)  SpO2: 100% 100%    General: NAD Cardiovascular: Regular rate and rhythm no murmurs rubs or gallops.  No JVD.  No lower extremity edema. Respiratory: Clear to auscultation bilaterally.  No wheezes, no crackles, no rhonchi.  Fair air movement.  Speaking in full sentences.  No use of accessory muscles of respiration.  Discharge Instructions   Discharge Instructions     Ambulatory Referral for Lung Cancer Scre   Complete by: As directed    Diet - low sodium heart healthy   Complete by: As directed    Discharge instructions   Complete by: As directed    Please stop drinking alcohol    Increase activity slowly   Complete by: As directed    Increase activity slowly   Complete by: As directed       Allergies as of 09/20/2023       Reactions   Bee Venom Anaphylaxis, Hives, Rash   Spider Antivenin [s Black Widow (latrodec Mactans) Antivenin] Anaphylaxis, Hives, Rash        Medication List     STOP taking these medications    FISH OIL PO   nadolol  20 MG tablet Commonly known as: CORGARD    sucralfate  1 GM/10ML suspension Commonly known as: CARAFATE    triamcinolone  cream 0.1 % Commonly known as: KENALOG       TAKE these medications    carvedilol  3.125 MG tablet Commonly known as: COREG  Take 1 tablet (3.125 mg total) by mouth 2 (two) times daily with a meal.   EPINEPHrine  0.3 mg/0.3 mL Soaj injection Commonly known as: EPI-PEN Use once.  If ineffective, use 2nd dose. What changed:  how much to take how to take this when to take this reasons to take this additional instructions   etanercept 50 MG/ML injection Commonly known as: ENBREL Inject 50 mg into the skin every Saturday.   folic acid  1 MG tablet Commonly known as: FOLVITE  Take 1 tablet (1  mg total) by mouth daily.   furosemide  20 MG tablet Commonly known as: Lasix  Take 1 tablet (20 mg total) by mouth daily as needed for fluid or edema.   hydroxychloroquine  200 MG tablet Commonly known as: PLAQUENIL  Take 200 mg by mouth daily.   lactulose  10 GM/15ML solution Commonly known as: CHRONULAC  Take 30 mLs (20 g total) by mouth daily as needed for mild constipation (Target 2-3 bowel movements a day to flush out ammonia).   multivitamin with minerals Tabs tablet Take 1 tablet by mouth daily.   pantoprazole  40 MG tablet Commonly known as: PROTONIX  Take 1 tablet (40 mg total) by mouth 2 (two) times daily.   rifaximin  550 MG Tabs tablet Commonly known as: XIFAXAN  Take 1 tablet (550 mg total) by mouth 2 (two) times daily.   thiamine  100 MG tablet Commonly known as: Vitamin B-1 Take  1 tablet (100 mg total) by mouth daily.       Allergies  Allergen Reactions   Bee Venom Anaphylaxis, Hives and Rash   Spider Antivenin [S Black Widow (Latrodec Mactans) Antivenin] Anaphylaxis, Hives and Rash    Contact information for follow-up providers     [facility] Follow up.   Specialty: Home Health Services Why: Will call you to set up appointments for Home Health Physical and Occupational Therapy. PLEASE ANSWER ALL CALLS. Contact information: [address] [place] [address] [phone]         [name], MD. Schedule an appointment as soon as possible for a visit in 2 week(s).   Specialty: Family Medicine Contact information: [address] [place] [address] [phone]         Mansouraty, Aloha Raddle., MD. Schedule an appointment as soon as possible for a visit in 4 week(s).   Specialties: Gastroenterology, Internal Medicine Contact information: [address] [place] [address] [phone]              Contact information for after-discharge care     Destination     [facility] .   Service: Skilled Nursing Contact information: [address] [place]  [address] [phone]                      The results of significant diagnostics from this hospitalization (including imaging, microbiology, ancillary and laboratory) are listed below for reference.    Significant Diagnostic Studies: MR ORBITS WO CONTRAST Result Date: 09/15/2023 CLINICAL DATA:  63 year old male. Falls. Weakness. Right eye ptosis. Previous abdomen ultrasound states history of cirrhosis. EXAM: MRI HEAD AND ORBITS WITHOUT CONTRAST TECHNIQUE: Multiplanar, multi-echo pulse sequences of the brain and surrounding structures were acquired without intravenous contrast. Multiplanar, multi-echo pulse sequences of the orbits and surrounding structures were acquired including fat saturation techniques, without  intravenous contrast administration. COMPARISON:  Head CT yesterday and earlier. FINDINGS: MRI HEAD FINDINGS Brain: No restricted diffusion to suggest acute infarction. No midline shift, mass effect, evidence of mass lesion, ventriculomegaly, extra-axial collection or acute intracranial hemorrhage. Cervicomedullary junction and pituitary are within normal limits. Largely normal for age gray and white matter signal throughout the brain. No cortical encephalomalacia. No chronic cerebral blood products on SWI. However, mild T2 and FLAIR heterogeneity in the deep gray nuclei - which is partially related to perivascular spaces - but there is evidence of a chronic posterior left lentiform lacunar infarct on series 9, image 30. Chronic left thalamic lacunar infarct also not excluded. Additionally, better demonstrated  on the thin slice dedicated orbit imaging, there is intrinsic increased T1 signal in the bilateral globus pallidus (series 6, image 16). Brainstem and cerebellum appear negative. Vascular: Major intracranial vascular flow voids are preserved. Skull and upper cervical spine: Within normal limits for age. Other: Mastoids are clear. Grossly normal visible internal auditory structures. Negative visible scalp and face. MRI ORBITS FINDINGS Orbits: Normal suprasellar cistern. Noncontrast optic chiasm, bilateral optic nerves, bilateral globes, and intraorbital soft tissues appear symmetric and negative. Visualized sinuses: Scattered mild paranasal sinus mucosal thickening and small retention cysts. Soft tissues: Visible face and periorbital soft tissues appear negative. Small volume retained secretions in the nasopharynx, posterior right nasal cavity. IMPRESSION: 1. Negative noncontrast MRI appearance of the Orbits. 2. 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 And intrinsic T1 signal in the globus pallidus compatible with hepatic insufficiency, cirrhosis. Correlation with serum ammonia levels  recommended. 3.  No superimposed acute intracranial abnormality. Electronically Signed   By: VEAR Hurst M.D.   On: 09/15/2023 11:30   MR BRAIN WO CONTRAST Result Date: 09/15/2023 CLINICAL DATA:  63 year old male. Falls. Weakness. Right eye ptosis. Previous abdomen ultrasound states history of cirrhosis. EXAM: MRI HEAD AND ORBITS WITHOUT CONTRAST TECHNIQUE: Multiplanar, multi-echo pulse sequences of the brain and surrounding structures were acquired without intravenous contrast. Multiplanar, multi-echo pulse sequences of the orbits and surrounding structures were acquired including fat saturation techniques, without intravenous contrast administration. COMPARISON:  Head CT yesterday and earlier. FINDINGS: MRI HEAD FINDINGS Brain: No restricted diffusion to suggest acute infarction. No midline shift, mass effect, evidence of mass lesion, ventriculomegaly, extra-axial collection or acute intracranial hemorrhage. Cervicomedullary junction and pituitary are within normal limits. Largely normal for age gray and white matter signal throughout the brain. No cortical encephalomalacia. No chronic cerebral blood products on SWI. However, mild T2 and FLAIR heterogeneity in the deep gray nuclei - which is partially related to perivascular spaces - but there is evidence of a chronic posterior left lentiform lacunar infarct on series 9, image 30. Chronic left thalamic lacunar infarct also not excluded. Additionally, better demonstrated on the thin slice dedicated orbit imaging, there is intrinsic increased T1 signal in the bilateral globus pallidus (series 6, image 16). Brainstem and cerebellum appear negative. Vascular: Major intracranial vascular flow voids are preserved. Skull and upper cervical spine: Within normal limits for age. Other: Mastoids are clear. Grossly normal visible internal auditory structures. Negative visible scalp and face. MRI ORBITS FINDINGS Orbits: Normal suprasellar cistern. Noncontrast optic chiasm,  bilateral optic nerves, bilateral globes, and intraorbital soft tissues appear symmetric and negative. Visualized sinuses: Scattered mild paranasal sinus mucosal thickening and small retention cysts. Soft tissues: Visible face and periorbital soft tissues appear negative. Small volume retained secretions in the nasopharynx, posterior right nasal cavity. IMPRESSION: 1. Negative noncontrast MRI appearance of the Orbits. 2. 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 And intrinsic T1 signal in the globus pallidus compatible with hepatic insufficiency, cirrhosis. Correlation with serum ammonia levels recommended. 3.  No superimposed acute intracranial abnormality. Electronically Signed   By: VEAR Hurst M.D.   On: 09/15/2023 11:30   DG Chest Port 1 View Result Date: 09/14/2023 CLINICAL DATA:  Weakness, decreased vision of right eye. EXAM: PORTABLE CHEST 1 VIEW COMPARISON:  February 27, 2023. FINDINGS: The heart size and mediastinal contours are within normal limits. Left lung is clear. Stable right basilar opacity is noted most consistent with scarring. The visualized skeletal structures are unremarkable. IMPRESSION: Stable  right basilar scarring. No acute abnormality seen. Electronically Signed   By: Lynwood Landy Raddle M.D.   On: 09/14/2023 15:03   CT Head Wo Contrast Result Date: 09/14/2023 CLINICAL DATA:  Headache. Neurological deficit. Worsening falls. Visual disturbance. EXAM: CT HEAD WITHOUT CONTRAST TECHNIQUE: Contiguous axial images were obtained from the base of the skull through the vertex without intravenous contrast. RADIATION DOSE REDUCTION: This exam was performed according to the departmental dose-optimization program which includes automated exposure control, adjustment of the mA and/or kV according to patient size and/or use of iterative reconstruction technique. COMPARISON:  08/01/2023 FINDINGS: Brain: No sign of acute stroke, mass, hemorrhage, hydrocephalus or extra-axial  collection. Vascular: There is atherosclerotic calcification of the major vessels at the base of the brain. Skull: Negative Sinuses/Orbits: Clear/normal Other: None IMPRESSION: No acute or reversible finding. Atherosclerotic calcification of the major vessels at the base of the brain. Electronically Signed   By: Oneil Officer M.D.   On: 09/14/2023 13:34    Microbiology: No results found for this or any previous visit (from the past 240 hours).   Labs: Basic Metabolic Panel: Recent Labs  Lab 09/15/23 0530 09/15/23 1943 09/16/23 0425 09/17/23 0443 09/19/23 0445 09/20/23 0415  NA 138  --  134* 135 139 138  K 3.4*  --  3.5 3.6 4.0 3.7  CL 109  --  105 109 109 107  CO2 21*  --  19* 21* 19* 19*  GLUCOSE 81  --  88 127* 104* 102*  BUN 6*  --  8 10 11 12   CREATININE 0.60*  --  0.68 0.63 0.68 0.64  CALCIUM  8.5*  --  8.9 8.7* 9.2 9.2  MG  --  1.8 1.8  --   --  2.1  PHOS  --   --  2.4* 3.0  --   --    Liver Function Tests: Recent Labs  Lab 09/14/23 1225 09/15/23 0530 09/16/23 0425 09/17/23 0443 09/19/23 0445  AST 93* 84*  --   --  59*  ALT 34 31  --   --  35  ALKPHOS 111 107  --   --  123  BILITOT 1.4* 1.9*  --   --  1.4*  PROT 6.9 6.1*  --   --  6.4*  ALBUMIN  3.3* 3.0* 3.1* 3.0* 3.5   No results for input(s): LIPASE, AMYLASE in the last 168 hours. Recent Labs  Lab 09/14/23 1622 09/17/23 1745 09/19/23 0445  AMMONIA 34 79* 70*   CBC: Recent Labs  Lab 09/15/23 0530 09/16/23 0425 09/17/23 0443 09/19/23 0445 09/20/23 0415  WBC 2.3* 3.2* 3.3* 3.7* 3.7*  NEUTROABS  --  0.9* 1.4*  --  1.8  HGB 8.0* 8.5* 8.8* 9.1* 9.8*  HCT 25.7* 27.2* 27.8* 29.2* 32.6*  MCV 82.9 82.7 82.5 83.0 82.7  PLT 32* 31* 30* 42* 50*   Cardiac Enzymes: Recent Labs  Lab 09/15/23 0530  CKTOTAL 253   BNP: BNP (last 3 results) Recent Labs    02/27/23 1741  BNP 22.2    ProBNP (last 3 results) No results for input(s): PROBNP in the last 8760 hours.  CBG: No results for input(s):  GLUCAP in the last 168 hours.     Signed:  Toribio Hummer MD.  [facility] 09/20/2023, 2:52 PM

## 2023-09-20 NOTE — TOC Transition Note (Signed)
 Transition of Care [facility]) - Discharge Note   Patient Details  Name: Robert Greene MRN: [id number] Date of Birth: 1960/05/23  Transition of [facility]) CM/SW Contact:  Doneta Glenys DASEN, RN Phone Number: 09/20/2023, 2:58 PM  Clinical Narrative:    Per MD patient is ready for discharge home with [facility] PT/OT. Has been set up with [facility].  Patient is aware and [name] notified of discharge. No additional IP CM needs identified. Please place consult if need present.   Final next level of care: Home w Home Health Services Barriers to Discharge: Barriers Resolved   Patient Goals and CMS Choice Patient states their goals for this hospitalization and ongoing recovery are:: Really rather go home CMS Medicare.gov Compare Post Acute Care list provided to::  (NA) Choice offered to / list presented to : NA [place] ownership interest in [facility].provided to:: Parent NA    Discharge Placement                       Discharge Plan and Services Additional resources added to the After Visit Summary for   In-house Referral: NA Discharge Planning Services: CM Consult Post Acute Care Choice: NA          DME Arranged: N/A DME Agency: NA       HH Arranged: PT, OT HH Agency: [facility] Date HH Agency Contacted: 09/20/23 Time HH Agency Contacted: 1200 Representative spoke with at [facility] Agency: KELLY  Social Drivers of Health (SDOH) Interventions SDOH Screenings   Food Insecurity: No Food Insecurity (09/14/2023)  Housing: Low Risk  (09/14/2023)  Transportation Needs: No Transportation Needs (09/14/2023)  Utilities: Not At Risk (09/14/2023)  Tobacco Use: High Risk (09/16/2023)     Readmission Risk Interventions     No data to display

## 2023-09-20 NOTE — Progress Notes (Signed)
 Occupational Therapy Treatment Patient Details Name: Robert Greene MRN: [id number] DOB: February 26, 1960 Today's Date: 09/20/2023   History of present illness Robert Greene is a 63 y.o. 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 Patient eager and open to all presented activity. Cues for falls prevention and sequencing for BADL's and mobility. Standing balance and tolerance improving.  Patient requires continued Acute care hospital level OT services to progress safety and functional performance and allow for discharge. Recommendations for discharge remain appropriate to ensure safety and progression of function and cognition.        If plan is discharge home, recommend the following:  A little help with walking and/or transfers;A little help with bathing/dressing/bathroom;Assistance with cooking/housework;Direct supervision/assist for medications management;Direct supervision/assist for financial management;Assist for transportation;Help with stairs or ramp for entrance;Supervision due to cognitive status   Equipment Recommendations  None recommended by OT       Precautions / Restrictions Precautions Precautions: Fall Recall of Precautions/Restrictions: Impaired Restrictions Weight Bearing Restrictions Per Provider Order: No       Mobility Bed Mobility Overal bed mobility: Modified Independent             General bed mobility comments: HOB elevated    Transfers Overall transfer level: Needs assistance Equipment used: Rolling walker (2 wheels) Transfers: Sit to/from Stand,  Bed to chair/wheelchair/BSC Sit to Stand: Contact guard assist     Step pivot transfers: Contact guard assist     General transfer comment: impulsive and cues to consistently use RW and maintain safety strategies for falls prevention     Balance Overall balance assessment: Needs assistance, History of Falls Sitting-balance support: Feet supported Sitting balance-Leahy Scale: Good     Standing balance support: No upper extremity supported, Bilateral upper extremity supported, Single extremity supported, During functional activity Standing balance-Leahy Scale: Fair Standing balance comment: standing level shaving activity as well as voiding in standing with WBOS for increased stability                           ADL either performed or assessed with clinical judgement   ADL Overall ADL's : Needs assistance/impaired Eating/Feeding: Modified independent   Grooming: Standing (shaving standign sink side)   Upper Body Bathing: Set up;Sitting   Lower Body Bathing: Contact guard assist;Sit to/from stand   Upper Body Dressing : Contact guard assist;Sitting   Lower Body Dressing: Contact guard assist;Sit to/from stand Lower Body Dressing Details (indicate cue type and reason): cues for figure 4 techniques for LB garment management and cues for slower pacing and position changes Toilet Transfer: Contact guard assist;Rolling walker (2 wheels);Regular Toilet;Grab bars   Toileting- Clothing Manipulation and Hygiene: Contact guard assist;Sit to/from stand Toileting - Clothing Manipulation Details (indicate cue type and reason): stood for voiding with unilateral grab bar support and min cues     Functional mobility during ADLs: Contact guard assist;Minimal assistance;Rolling walker (2 wheels) General ADL Comments: requires cues for safety, sequencing and falls prevention    Extremity/Trunk Assessment Upper Extremity Assessment Upper Extremity Assessment: Right hand  dominant;Overall [facility] for tasks assessed   Lower  Extremity Assessment Lower Extremity Assessment: Defer to PT evaluation        Vision   Vision Assessment?: Wears glasses for reading;No apparent visual deficits         Communication Communication Communication: No apparent difficulties   Cognition Arousal: Alert Behavior During Therapy: Impulsive Cognition: Cognition impaired   Orientation impairments: Time Awareness: Online awareness impaired Memory impairment (select all impairments): Short-term memory Attention impairment (select first level of impairment): Selective attention Executive functioning impairment (select all impairments): Reasoning, Problem solving, Sequencing OT - Cognition Comments: decreased insight and judgement                 Following commands: Impaired Following commands impaired: Follows one step commands with increased time      Cueing   Cueing Techniques: Verbal cues        General Comments no SOB or pain reported, no LE edema noted    Pertinent Vitals/ Pain       Pain Assessment Pain Assessment: Faces Faces Pain Scale: No hurt   Frequency  Min 2X/week        Progress Toward Goals  OT Goals(current goals can now be found in the care plan section)  Progress towards OT goals: Progressing toward goals  Acute Rehab OT Goals Patient Stated Goal: to keep getting stronger OT Goal Formulation: With patient Time For Goal Achievement: 10/02/23 Potential to Achieve Goals: Good ADL Goals Pt Will Perform Lower Body Bathing: with modified independence;sit to/from stand Pt Will Perform Lower Body Dressing: sit to/from stand;with modified independence Pt Will Transfer to Toilet: with modified independence;regular height toilet;ambulating Pt Will Perform Toileting - Clothing Manipulation and hygiene: with modified independence;sit to/from stand Pt Will Perform Tub/Shower Transfer: with supervision;shower seat;rolling walker;Shower  transfer  Plan         AM-PAC OT 6 Clicks Daily Activity     Outcome Measure   Help from another person eating meals?: None Help from another person taking care of personal grooming?: None Help from another person toileting, which includes using toliet, bedpan, or urinal?: A Little Help from another person bathing (including washing, rinsing, drying)?: A Little Help from another person to put on and taking off regular upper body clothing?: A Little Help from another person to put on and taking off regular lower body clothing?: A Little 6 Click Score: 20    End of Session Equipment Utilized During Treatment: Gait belt;Rolling walker (2 wheels)  OT Visit Diagnosis: Unsteadiness on feet (R26.81);History of falling (Z91.81);Cognitive communication deficit (R41.841) Symptoms and signs involving cognitive functions: Other cerebrovascular disease   Activity Tolerance Patient tolerated treatment well   Patient Left in bed;with call bell/phone within reach;with bed alarm set   Nurse Communication Mobility status        Time: 0950-1010 OT Time Calculation (min): 20 min  Charges: OT General Charges $OT Visit: 1 Visit OT Treatments $Self Care/Home Management : 8-22 mins  Tracye Szuch OT/L Acute Rehabilitation Department  [phone]  09/20/2023, 10:34 AM

## 2023-09-20 NOTE — Plan of Care (Signed)

## 2023-09-20 NOTE — Plan of Care (Signed)
  Problem: Clinical Measurements: Goal: Will remain free from infection Outcome: Progressing   Problem: Safety: Goal: Ability to remain free from injury will improve Outcome: Progressing   Problem: Nutrition: Goal: Adequate nutrition will be maintained Outcome: Progressing   Problem: Activity: Goal: Risk for activity intolerance will decrease Outcome: Progressing

## 2023-09-20 NOTE — Plan of Care (Signed)
  Problem: Education: Goal: Knowledge of General Education information will improve Description: Including pain rating scale, medication(s)/side effects and non-pharmacologic comfort measures 09/20/2023 1452 by Yasin Ducat C, RN Outcome: Adequate for Discharge 09/20/2023 1342 by Elanda Garmany C, RN Outcome: Progressing   Problem: Health Behavior/Discharge Planning: Goal: Ability to manage health-related needs will improve 09/20/2023 1452 by Voula Waln C, RN Outcome: Adequate for Discharge 09/20/2023 1342 by Icie Kuznicki C, RN Outcome: Progressing   Problem: Clinical Measurements: Goal: Ability to maintain clinical measurements within normal limits will improve 09/20/2023 1452 by Ryna Beckstrom C, RN Outcome: Adequate for Discharge 09/20/2023 1342 by Yazen Rosko C, RN Outcome: Progressing Goal: Will remain free from infection 09/20/2023 1452 by Eloise Picone C, RN Outcome: Adequate for Discharge 09/20/2023 1342 by Makaylie Dedeaux C, RN Outcome: Progressing Goal: Diagnostic test results will improve 09/20/2023 1452 by Lavell Ridings C, RN Outcome: Adequate for Discharge 09/20/2023 1342 by Cabria Micalizzi C, RN Outcome: Progressing Goal: Respiratory complications will improve 09/20/2023 1452 by Rachit Grim C, RN Outcome: Adequate for Discharge 09/20/2023 1342 by Wayland Baik C, RN Outcome: Progressing Goal: Cardiovascular complication will be avoided 09/20/2023 1452 by Brandyn Lowrey C, RN Outcome: Adequate for Discharge 09/20/2023 1342 by Millenia Waldvogel C, RN Outcome: Progressing   Problem: Activity: Goal: Risk for activity intolerance will decrease 09/20/2023 1452 by Alinna Siple C, RN Outcome: Adequate for Discharge 09/20/2023 1342 by Gordon Carolyn BROCKS, RN Outcome: Progressing   Problem: Nutrition: Goal: Adequate nutrition will be maintained 09/20/2023 1452 by Desi Rowe C, RN Outcome: Adequate for Discharge 09/20/2023 1342 by Gordon Carolyn BROCKS, RN Outcome: Progressing   Problem: Coping: Goal: Level of anxiety will decrease 09/20/2023 1452 by Armonee Bojanowski C, RN Outcome: Adequate for Discharge 09/20/2023 1342 by Naphtali Zywicki C, RN Outcome: Progressing   Problem: Elimination: Goal: Will not experience complications related to bowel motility 09/20/2023 1452 by Rosa Wyly C, RN Outcome: Adequate for Discharge 09/20/2023 1342 by Joselyn Edling C, RN Outcome: Progressing Goal: Will not experience complications related to urinary retention 09/20/2023 1452 by Gordon Carolyn BROCKS, RN Outcome: Adequate for Discharge 09/20/2023 1342 by Marck Mcclenny C, RN Outcome: Progressing   Problem: Pain Managment: Goal: General experience of comfort will improve and/or be controlled 09/20/2023 1452 by Jina Olenick C, RN Outcome: Adequate for Discharge 09/20/2023 1342 by Nil Xiong C, RN Outcome: Progressing   Problem: Safety: Goal: Ability to remain free from injury will improve 09/20/2023 1452 by Shikara Mcauliffe C, RN Outcome: Adequate for Discharge 09/20/2023 1342 by Maudry Zeidan C, RN Outcome: Progressing   Problem: Skin Integrity: Goal: Risk for impaired skin integrity will decrease 09/20/2023 1452 by Malikye Reppond C, RN Outcome: Adequate for Discharge 09/20/2023 1342 by Seraj Dunnam C, RN Outcome: Progressing

## 2023-09-26 ENCOUNTER — Telehealth: Payer: Self-pay | Admitting: Internal Medicine

## 2023-09-26 DIAGNOSIS — J849 Interstitial pulmonary disease, unspecified: Secondary | ICD-10-CM

## 2023-09-26 NOTE — Progress Notes (Unsigned)
 Subjective:  Patient ID: Robert Greene, male , DOB: 1960-04-08 , age 63 y.o. , MRN: [id number] , ADDRESS: [address] [place] [address] PCP [name], MD Patient Care Team: [name], MD as PCP - General (Family Medicine) Livingston Rigg, MD as Consulting Physician (Dermatology)  This Provider for this visit: Treatment Team:  Attending Provider: Geronimo Amel, MD    Chief Complaint  Patient presents with   Follow-up    ILD, doing well       HPI Robert Greene 63 y.o. 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  Overall he is stable as documented in the symptom score below.  He continues to smoke.  In fact he smoking half pack cigarettes a day.  In terms of his vaccination he has had Prevnar but not Pneumovax.  He is willing to have Pneumovax.  He says he has had his flu shot but the records do not seem updated for this.  He again refused Covid vaccine.  His symptom score and PFTs are stable.  He did not have high-resolution CT chest but instead he had low-dose CT chest for lung cancer screening.  But at least looking at symptom score walk test and desaturation test everything is stable in terms of his ILD.  He complains of some dyspnea on exertion as documented below.  We offered him pulmonary rehabilitation but he refused.  He says he is working out enough in the yard.    CT chest LDCT 12/08/19   IMPRESSION: 1. Lung-RADS 1, negative. Continue annual screening with low-dose chest CT without contrast in 12 months. 2. Basilar predominant subpleural fibrosis, suboptimally characterized with low-dose technique. Nonspecific interstitial pneumonitis or usual interstitial pneumonitis could have this appearance. Patient underwent lung biopsy 12/06/2017. 3. Aortic atherosclerosis (ICD10-I70.0). Coronary artery calcification. 4.  Emphysema (ICD10-J43.9).     Electronically Signed   By: Newell Eke M.D.   On: 12/08/2019 11:24   OV  10/22/2020      10/22/2020 -   Chief Complaint  Patient presents with   Follow-up    Pt states he has been doing okay since last visit and states his breathing is about the same.     HPI Robert Greene 63 y.o. -with a history of smoking related-ILD and rheumatoid arthritis here for follow-up.  Patient reports that since his last visit he has been doing about the same.  His breathing has been stable at rest.  However he continues to have dyspnea on exertion especially after working in his yard.  He reports difficulty walking on the stairs but he thinks this is more related to joint stiffness and pain from his rheumatoid arthritis.  He has cut down on his smoking to about half a pack of cigarettes per day.  He wants to continue to quit cold [place] and is not interested in any smoking cessation resources.  States he received the pneumonia vaccine last year and he is interested in getting the flu vaccine today.     CT Chest data   OV 12/10/2020  Subjective:  Patient ID: Robert Greene, male , DOB: 11-23-1960 , age 63 y.o. , MRN: [id number] , ADDRESS: [address] [place] [address] PCP [name], MD Patient Care Team: [name], MD as PCP - General (Family Medicine) Livingston Rigg, MD as Consulting Physician (Dermatology)  This Provider for this visit: Treatment Team:  Attending Provider: Geronimo Amel, MD  Type of visit: Telephone/Video Circumstance:  COVID-19 national emergency Identification of patient Robert Greene with 1960-01-25 and MRN [id number] - 2 person identifier Risks: Risks, benefits, limitations of telephone visit explained. Patient understood and verbalized agreement to proceed Anyone else on call: just patient Patient location: his home - [phone] This provider location: [address]; [place]; [place] [address]. [facility]. [phone]    12/10/2020 -  call to give CT abd results.  He has interstitial lung  disease.  He has follow-up appointment pending with me in mid December 2022.  He had routine ILD CT scan and this shows ILD stable but the report also shows new onset of cirrhosis with liver mass.  I therefore set up this telephone appointment to call him and give him the results.  He said his wife passed away at age 67 from a heart attack in the spring 2022.  Since then he has been drinking a lot of alcohol.  He saw his rheumatologist Dr. Mai recently and was advised to quit drinking because his liver enzymes were high.  I did express to him that there is findings of cirrhosis.  He told me that a few years ago he had right upper quadrant ultrasound it was normal.  Expressed to him that he also has a liver mass.  Told him this requires an MRI.  He recollects having had an MRI.  He says there is no metal in his body.  He feels that there is no claustrophobia and he can safely have an MRI.  Told him that I would set up these test but then refer him to GI.  He is agreeable with this plan.   HPI Robert Greene 63 y.o. -    CT Chest data  CT Chest High Resolution  Result Date: 12/09/2020 CLINICAL DATA:  63 year old male with history of pulmonary fibrosis. Shortness of breath on exertion. Smoker. EXAM: CT CHEST WITHOUT CONTRAST TECHNIQUE: Multidetector CT imaging of the chest was performed following the standard protocol without intravenous contrast. High resolution imaging of the lungs, as well as inspiratory and expiratory imaging, was performed. COMPARISON:  Chest CT 12/08/2019. FINDINGS: Cardiovascular: Heart size is normal. There is no significant pericardial fluid, thickening or pericardial calcification. There is aortic atherosclerosis, as well as atherosclerosis of the great vessels of the mediastinum and the coronary arteries, including calcified atherosclerotic plaque in the left main, left anterior descending and left circumflex coronary arteries. Mediastinum/Nodes: No pathologically  enlarged mediastinal or hilar lymph nodes. Esophagus is unremarkable in appearance. No axillary lymphadenopathy. Lungs/Pleura: High-resolution images again demonstrate some patchy areas of ground-glass attenuation, septal thickening, subpleural reticulation, traction bronchiectasis and peripheral bronchiolectasis. Findings are mildly progressive compared to the prior study. Findings are also asymmetric involving the right lung to a greater extent than the left (similar to the prior study). No frank honeycombing. Inspiratory and expiratory imaging is unremarkable. There is also mild diffuse bronchial wall thickening with mild centrilobular and paraseptal emphysema. No acute consolidative airspace disease. No pleural effusions. No definite suspicious appearing pulmonary nodules or masses are noted. Upper Abdomen: Diffuse low attenuation throughout the visualized hepatic parenchyma, indicative of a background of hepatic steatosis. Liver has a slightly nodular contour, suggesting underlying cirrhosis. There is an incompletely imaged mass-like area in the right upper quadrant of the abdomen, which appears contiguous with the undersurface of the right lobe of the liver in the region of segment 6 (axial image 177 of series 2), measuring at least 4.3 x 3.6  cm with ill-defined poorly delineated margins, concerning for potential neoplasm. Musculoskeletal: There are no aggressive appearing lytic or blastic lesions noted in the visualized portions of the skeleton. IMPRESSION: 1. The appearance of the lungs is compatible with interstitial lung disease, with a spectrum of findings categorized as probable usual interstitial pneumonia (UIP) per current ATS guidelines. 2. Mild diffuse bronchial wall thickening with mild centrilobular and paraseptal emphysema is also noted; imaging findings suggestive of concurrent COPD. 3. Aortic atherosclerosis, in addition to left main and 3 vessel coronary artery disease. Please note that  although the presence of coronary artery calcium  documents the presence of coronary artery disease, the severity of this disease and any potential stenosis cannot be assessed on this non-gated CT examination. Assessment for potential risk factor modification, dietary therapy or pharmacologic therapy may be warranted, if clinically indicated. 4. Hepatic steatosis, with morphologic changes in the liver concerning for underlying cirrhosis. Notably, there is also an incompletely imaged mass-like area in the right upper quadrant of the abdomen, potentially emanating from the liver. This has poorly defined margins and is concerning for neoplasm. Further evaluation with nonemergent abdominal MRI with and without IV gadolinium is strongly recommended in the near future to better evaluate this finding. These results will be called to the ordering clinician or representative by the Radiologist Assistant, and communication documented in the PACS or [employer]. Aortic Atherosclerosis (ICD10-I70.0) and Emphysema (ICD10-J43.9). Electronically Signed   By: Toribio Aye M.D.   On: 12/09/2020 19:21       OV 01/04/2021  Subjective:  Patient ID: Robert Greene, male , DOB: [birth year] , age 45 y.o. , MRN: [id number] , ADDRESS: [address] [place] [address] PCP [name], MD Patient Care Team: [name], MD as PCP - General (Family Medicine) Livingston Rigg, MD as Consulting Physician (Dermatology)  This Provider for this visit: Treatment Team:  Attending Provider: Geronimo Amel, MD    01/04/2021 -   Chief Complaint  Patient presents with   Follow-up    PFT performed today. HRCT and MRA recently performed. Pt states he has been doing okay since last visit and denies any complaints. States breathing is about the same.     HPI KYROLLOS CORDELL 63 y.o. -returns for follow-up for smoking-related interstitial fibrosis.  #ILD-smoking-related interstitial lung fibrosis [also rheumatoid  arthritis on TNF alpha blockade]-continues to be stable.  Pulmonary function test might show slow decline over many years but most recently stable.  CT scan of the chest is also deemed stable  #Incidental finding: When we did the high-resolution CT the CT chest showed some liver masses.  So we got an MRI there are no liver masses but he definitely has cirrhosis.  He states he continues to drink alcohol because of grief reaction after his wife passed away.  He is also smoking now.  His liver function test is abnormal.  I referred him to GI but for some reason this has not happened he does not know why.  We also got hepatitis virus panel.  He is worried about the findings on his liver.  He knows he needs to quit but is unable to    #smoking: This has not been relapse for 2 years.  Problem with a risk factor for progression of the ILD.   CT Chest data    OV 07/14/2021  Subjective:  Patient ID: KETRICK MATNEY, male , DOB: 1960/04/14 , age 38 y.o. , MRN: [id number] , ADDRESS: [address] [place]  [place] [address] PCP [name], MD Patient Care Team: [name], MD as PCP - General (Family Medicine) Livingston Rigg, MD as Consulting Physician (Dermatology)  This Provider for this visit: Treatment Team:  Attending Provider: Geronimo Amel, MD    07/14/2021 -   Chief Complaint  Patient presents with   Follow-up    PFT performed today.  Pt states he has been doing okay since last visit.     HPI CLEMENTE DEWEY 63 y.o. -returns for follow-up for his interstitial lung disease [S RIF] associated with smoking but he also has rheumatoid arthritis.  Overall he feels stable.  His pulmonary function test is stable.  Still normal within normal limits.  He has no new problems.  His smoking has relapsed.  He has not been able to quit.  He wants to quit on his own.  He had early cirrhosis referred him to GI.  He has seen Vina Crazier and Dr. Wilhelmenia.  An endoscopy is being planned.  There are no  other issues.  He says he is quit hard liquor but he takes an occasional beer.     OV 02/14/2022  Subjective:  Patient ID: Robert Greene, male , DOB: July 28, 1960 , age 42 y.o. , MRN: [id number] , ADDRESS: [address] [place] [address] PCP [name], MD Patient Care Team: [name], MD as PCP - General (Family Medicine) Livingston Rigg, MD (Inactive) as Consulting Physician (Dermatology)  This Provider for this visit: Treatment Team:  Attending Provider: Geronimo Amel, MD  #ILD-smoking-related interstitial lung fibrosis [also rheumatoid arthritis on TNF alpha blockade]-continues to be stable.  .  CT scan of the chest is also deemed stable  02/14/2022 -   Chief Complaint  Patient presents with   Follow-up    Pt is here for follow up for IPF. Pt did have spiro before visit. CT scan was done last month on 12/14. Pt is not on any pulmonary medications at the moment     HPI VAYDEN WEINAND 63 y.o. -minutes returns for 63-month follow-up.  He continues to do well.  He is improving in terms of his grief reaction from the loss of his wife.  He is now dating someone.  However he still is smoking a few cigarettes here and there.  He also continues to drink.  He has seen gastroenterology Dr. Aloha Wilhelmenia 11/08/2021.  External records reviewed.  He has been asked to return for follow-up in 5 months.  He has been asked to quit alcohol intake.  He says he is struggling to do this but is trying.  In any event symptoms are stable.  Pulmonary function test done today and reviewed is also stable.  He endorses no new issues.  He continues on Enbrel for his rheumatoid arthritis.   Issue #2: Smoking.  Advised him to quit.  He is known to quit on his own.  Issue #3: Vaccine counseling.  Recommended RSV vaccine.  Recommend flu shot todaybut he already had it.  He will not have the COVID-vaccine.  Recommend shingles vaccine  Issue 4: There is mild emphysema on the CT.  He is stable  without any wheezing or cough.  Will give him albuterol .   OV 09/27/2023  Subjective:  Patient ID: Robert Greene, male , DOB: 1960/05/09 , age 58 y.o. , MRN: [id number] , ADDRESS: [address] [place] [address] PCP [name], MD Patient Care Team: [name], MD as PCP - General (Family Medicine) Livingston Rigg,  MD as Consulting Physician (Dermatology)  This Provider for this visit: Treatment Team:  Attending Provider: Geronimo Amel, MD    09/27/2023 -   Chief Complaint  Patient presents with   Medical Management of Chronic Issues   Interstitial Lung Disease    Breathing is unchanged since the last visit.      HPI DEVANSH RIESE 63 y.o. -  Follow-up  #Interstitial lung disease [smoking-related interstitial fibrosis associated with rheumatoid arthritis on TNF alpha blockade] this continues to be stable symptom score is stable.  Last visit was 18 months ago or so.  Smoking: He continues to smoke.  Advised to quit  Mild emphysema is on albuterol  as needed and not needing it  Other health issues - July 2025 in August 2020 for got admitted for cirrhosis with variceal bleed and also syncope due to cirrhosis.  He has pancytopenia now.  He continues to drink he is depressed.  Most recent hemoglobin just over a week ago is stable at hemoglobin greater than 9.  He is not having any bleeding right now but is not compliant with his lactulose .  I advised him to restart his lactulose .  He lives alone.  He continues to drink.  He will be following with GI.  This history is obtained from him and also external medical record Review   SYMPTOM SCALE - ILD 03/12/2018  06/26/2019  01/01/2020  10/22/2020 02/14/2022   09/27/2023   O2 use ra  ra  ra ra  Shortness of Breath 0 -> 5 scale with 5 being worst (score 6 If unable to do)       At rest 1 1 1 1  0 0  Simple tasks - showers, clothes change, eating, shaving 0* 1 2 1  0 1  Household (dishes, doing bed, laundry) 1* 2 2 2  0 1   Shopping 0 1 1 1  0 1  Walking level at own pace 1 2 1  0 1 1  Walking up Stairs 2 3 3 3 2 2   Total (40 - 48) Dyspnea Score 8 10 10 9 3 6   How bad is your cough? 1 1 1 1 1  0  How bad is your fatigue 1 1 1 1  0 0  nausea  1 2 1  0 1  vomit  0 0 0 0 0  diarrhea  0 0 0 0 0  anxiety  1 0 1 0 1  depression  0 0 2 1 1      Simple office walk 185 feet x  3 laps goal with forehead probe 09/26/2017  11/22/2017  03/12/2018  /yf  06/26/2019  10/22/2020  O2 used Room air Room air Room air  Room air Room air  Number laps completed 3 3 3  x 185 feet  3 x 185 3  Comments about pace Normal to mod pace  normal  nl nl  Resting Pulse Ox/HR 99% and 69/min 100% and 73/min 99% and 68/min 96% annd 91/min 100% and 69 100% and 63  Final Pulse Ox/HR 99% and 89/min 100% and 97/min 97% and 88/min 96% and 101 100% and 91/min 100% and 102  Desaturated </= 88% no no no  no no  Desaturated <= 3% points no no no  no Yes  Got Tachycardic >/= 90/min No but almost yes no  yes Yes  Symptoms at end of test no x none  Mild dyspnea Mild SOB  Miscellaneous comments x x x   x   Simple walk test  Modified Six Minute Walk - 02/14/22 1100     Type of O2 used  Room Air    Number of laps completed  3    Lap Pace Moderate    Resting Heartrate 94 bpm    Final Heartrate 104 bpm    Resting Pulse Ox 99 %    Desaturated to <= 3 points No    Desaturated to < 88% No    Became tachycardic No    Symptoms  patietn reported no symptoms other then have rhuematiod arth and having to walk slow and steady    Was the O2 correction test done? No    comments lap1: 98%/104, lap2:98%/110, lap3:100%/104 and final 100%/92             PFT     Latest Ref Rng & Units 02/14/2022   10:24 AM 07/14/2021   12:44 PM 01/04/2021    9:38 AM 07/30/2020   11:40 AM 12/26/2019    9:48 AM 06/16/2019    8:51 AM 03/12/2018    9:01 AM  PFT Results  FVC-Pre L 4.24  4.25  4.00  4.10  4.24  4.14  3.69   FVC-Predicted Pre % 91  92  86  88  90  88  77   Pre  FEV1/FVC % % 74  75  75  73  72  71  72   FEV1-Pre L 3.12  3.17  3.01  2.99  3.07  2.96  2.64   FEV1-Predicted Pre % 89  91  85  85  86  83  73   DLCO uncorrected ml/min/mmHg 24.13  22.34  25.46  25.83  26.60  28.02  29.08   DLCO UNC% % 89  83  94  95  97  103  105   DLCO corrected ml/min/mmHg 24.13  23.72  27.73  25.83  26.60  28.02    DLCO COR %Predicted % 89  88  102  95  97  103    DLVA Predicted % 98  92  114  103  99  113  119        LAB RESULTS last 96 hours No results found.       has a past medical history of Alcoholism (HCC), Anxiety, Arthritis, Cirrhosis (HCC), Coronary artery calcification seen on CAT scan (04/08/2018), Dyspnea, HTN (hypertension) (04/08/2018), Hypercholesteremia, Hypertension, Interstitial lung disease (HCC), Kidney stones (2020), and Pulmonary fibrosis (HCC).   reports that he has been smoking cigarettes. He has a 60 pack-year smoking history. He has never used smokeless tobacco.  Past Surgical History:  Procedure Laterality Date   COLONOSCOPY N/A 08/03/2023   Procedure: COLONOSCOPY;  Surgeon: Mansouraty, Aloha Raddle., MD;  Location: [facility];  Service: Gastroenterology;  Laterality: N/A;   ESOPHAGOGASTRODUODENOSCOPY N/A 08/03/2023   Procedure: EGD (ESOPHAGOGASTRODUODENOSCOPY);  Surgeon: Wilhelmenia Aloha Raddle., MD;  Location: [facility];  Service: Gastroenterology;  Laterality: N/A;   ESOPHAGOGASTRODUODENOSCOPY N/A 09/16/2023   Procedure: EGD (ESOPHAGOGASTRODUODENOSCOPY);  Surgeon: Nandigam, Kavitha V, MD;  Location: [facility];  Service: Gastroenterology;  Laterality: N/A;   KNEE CARTILAGE SURGERY Left    x2   LUNG BIOPSY Right 12/06/2017   Procedure: LUNG BIOPSY;  Surgeon: Kerrin Elspeth BROCKS, MD;  Location[facility];  Service: Thoracic;  Laterality: Right;   VIDEO ASSISTED THORACOSCOPY Right 12/06/2017   Procedure: VIDEO ASSISTED THORACOSCOPY;  Surgeon: Kerrin Elspeth BROCKS, MD;  Location: [facility] OR;  Service: Thoracic;  Laterality: Right;     Allergies  Allergen  Reactions   Bee Venom Anaphylaxis, Hives and Rash   Spider Antivenin [S Black Widow (Latrodec Mactans) Antivenin] Anaphylaxis, Hives and Rash    Immunization History  Administered Date(s) Administered   Influenza,inj,Quad PF,6+ Mos 09/26/2017, 09/01/2018, 10/22/2020   Influenza-Unspecified 12/04/2021   Pneumococcal Conjugate-13 11/22/2017   Pneumococcal Polysaccharide-23 01/01/2020   Zoster, Live 06/24/2019    Family History  Problem Relation Age of Onset   Cirrhosis Father    Colon cancer Neg Hx    Stomach cancer Neg Hx    Esophageal cancer Neg Hx    Inflammatory bowel disease Neg Hx    Liver disease Neg Hx    Pancreatic cancer Neg Hx    Rectal cancer Neg Hx      Current Outpatient Medications:    carvedilol  (COREG ) 3.125 MG tablet, Take 1 tablet (3.125 mg total) by mouth 2 (two) times daily with a meal., Disp: 60 tablet, Rfl: 1   EPINEPHrine  0.3 mg/0.3 mL IJ SOAJ injection, Use once.  If ineffective, use 2nd dose., Disp: 2 Device, Rfl: 0   etanercept (ENBREL) 50 MG/ML injection, Inject 50 mg into the skin every Saturday., Disp: , Rfl:    folic acid  (FOLVITE ) 1 MG tablet, Take 1 tablet (1 mg total) by mouth daily., Disp: 30 tablet, Rfl: 1   furosemide  (LASIX ) 20 MG tablet, Take 1 tablet (20 mg total) by mouth daily as needed for fluid or edema., Disp: 30 tablet, Rfl: 0   hydroxychloroquine  (PLAQUENIL ) 200 MG tablet, Take 200 mg by mouth daily., Disp: , Rfl:    lactulose  (CHRONULAC ) 10 GM/15ML solution, Take 30 mLs (20 g total) by mouth daily as needed for mild constipation (Target 2-3 bowel movements a day to flush out ammonia)., Disp: 473 mL, Rfl: 1   Multiple Vitamin (MULTIVITAMIN WITH MINERALS) TABS tablet, Take 1 tablet by mouth daily., Disp: , Rfl:    pantoprazole  (PROTONIX ) 40 MG tablet, Take 1 tablet (40 mg total) by mouth 2 (two) times daily., Disp: 60 tablet, Rfl: 2   rifaximin  (XIFAXAN ) 550 MG TABS tablet, Take 1 tablet (550 mg total) by  mouth 2 (two) times daily., Disp: 60 tablet, Rfl: 1   thiamine  (VITAMIN B-1) 100 MG tablet, Take 1 tablet (100 mg total) by mouth daily., Disp: 30 tablet, Rfl: 1      Objective:   Vitals:   09/27/23 0822  BP: 132/74  Pulse: (!) 101  SpO2: 100%  Weight: 203 lb (92.1 kg)  Height: 5' 9 (1.753 m)    Estimated body mass index is 29.98 kg/m as calculated from the following:   Height as of this encounter: 5' 9 (1.753 m).   Weight as of this encounter: 203 lb (92.1 kg).  @WEIGHTCHANGE @  [employer]   09/27/23 0822  Weight: 203 lb (92.1 kg)     Physical Exam   General: No distress. Looks malnourshed now O2 at rest: no Cane present: no Sitting in wheel chair: no Frail: no Obese: no Neuro: Alert and Oriented x 3. GCS 15. Speech normal Psych: Pleasant Resp:  Barrel Chest - no.  Wheeze - no, Crackles - no, No overt respiratory distress CVS: Normal heart sounds. Murmurs - no Ext: Stigmata of Connective Tissue Disease - no HEENT: Normal upper airway. PEERL +. No post nasal drip        Assessment/     Assessment & Plan ILD (interstitial lung disease) (HCC)  Alcoholic cirrhosis of liver without ascites (HCC)  Blood loss anemia  Moderate cigarette  smoker (10-19 per day)    PLAN Patient Instructions  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  Plan  - albuterol  as needed - quit smoking   Moderate cigarette smoker (10-19 per day)  -Too bad this has been relapsed but glad you are only smoking some puffs and trying to quit - continued smoking is risk factor for fibrosis getting worse  Plan --I hope you quit smoking in  the next few months     Alcoholism and cirrhosis liver  worse  Plan  - continue care through GI sevice - restart lactulose   - stop alcohol  Blood loss anemia due to cirrhosis  - stabilized. CLnically no bleeding 09/27/2023  Plan  - monitor via PCP and GI   Follow-up -6 months dodo spirometry and DLCO -Return to see  APP in 6 months  - Kayani Rapaport in 12 months; 15 min visit    FOLLOWUP    Return for -Return to see APP in 6 months .Ennis Delpozo in 12 months; 15 min visit.    SIGNATURE    Dr. Dorethia Cave, M.D., F.C.C.P,  Pulmonary and Critical Care Medicine Staff Physician, [facility] Director - Interstitial Lung Disease  Program  Pulmonary Fibrosis [facility] at [facility] [place], [place], [address]  Pager: [phone], If no answer or between  15:00h - 7:00h: call [phone] Telephone: [phone]  8:57 AM 09/27/2023

## 2023-09-26 NOTE — Telephone Encounter (Signed)
 Pt procedure was completed on 8/24 with Dr Nandigam - nothing further needed

## 2023-09-26 NOTE — Patient Instructions (Incomplete)
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  Plan  - albuterol as needed - quit smoking   Moderate cigarette smoker (10-19 per day)  -Too bad this has been relapsed but glad you are only smoking some puffs and trying to quit - continued smoking is risk factor for fibrosis getting worse  Plan --I hope you quit smoking in the next few months   Vaccine counseling  Plan  - recommend RSV vaccine from commercial pharmacy - recommend Please talk to PCP Collene Leyden, MD -  and ensure you get  shingrix ([place]) inactivated vaccine against shingles    Alcoholism and cirrhosis liver  Glad you are seeing [facility]  Plan  - continue care through them   Follow-up -6 months dodo spirometry and DLCO -Return to see Dr. [name] for a 30-minute face-to-face visit in ILD clinic or any day in 6 months after the test

## 2023-09-26 NOTE — Telephone Encounter (Signed)
 Pt is scheduled with MR tomorrow for 6 month rov- was supposed to have had spiro/dlco before the visit and this was unfortunately never scheduled. There is an opening this afternoon for spiro/dlco- called the pt and attempted to schedule this for him, but there was no answer- LMTCB.

## 2023-09-27 ENCOUNTER — Encounter: Payer: Self-pay | Admitting: Internal Medicine

## 2023-09-27 ENCOUNTER — Ambulatory Visit (INDEPENDENT_AMBULATORY_CARE_PROVIDER_SITE_OTHER): Admitting: Internal Medicine

## 2023-09-27 VITALS — BP 132/74 | HR 101 | Ht 69.0 in | Wt 203.0 lb

## 2023-09-27 DIAGNOSIS — K7030 Alcoholic cirrhosis of liver without ascites: Secondary | ICD-10-CM

## 2023-09-27 DIAGNOSIS — F17210 Nicotine dependence, cigarettes, uncomplicated: Secondary | ICD-10-CM

## 2023-09-27 DIAGNOSIS — J849 Interstitial pulmonary disease, unspecified: Secondary | ICD-10-CM | POA: Diagnosis not present

## 2023-09-27 DIAGNOSIS — D500 Iron deficiency anemia secondary to blood loss (chronic): Secondary | ICD-10-CM | POA: Diagnosis not present

## 2023-09-27 NOTE — Telephone Encounter (Signed)
 Pt was seen 09/26/23 and PFT planned for future appt. Order placed. Nothing further needed.

## 2023-09-29 DIAGNOSIS — E876 Hypokalemia: Secondary | ICD-10-CM | POA: Diagnosis not present

## 2023-09-29 DIAGNOSIS — Z9181 History of falling: Secondary | ICD-10-CM | POA: Diagnosis not present

## 2023-09-29 DIAGNOSIS — H02401 Unspecified ptosis of right eyelid: Secondary | ICD-10-CM | POA: Diagnosis not present

## 2023-09-29 DIAGNOSIS — I2510 Atherosclerotic heart disease of native coronary artery without angina pectoris: Secondary | ICD-10-CM | POA: Diagnosis not present

## 2023-09-29 DIAGNOSIS — E782 Mixed hyperlipidemia: Secondary | ICD-10-CM | POA: Diagnosis not present

## 2023-09-29 DIAGNOSIS — K760 Fatty (change of) liver, not elsewhere classified: Secondary | ICD-10-CM | POA: Diagnosis not present

## 2023-09-29 DIAGNOSIS — K3189 Other diseases of stomach and duodenum: Secondary | ICD-10-CM | POA: Diagnosis not present

## 2023-09-29 DIAGNOSIS — K7030 Alcoholic cirrhosis of liver without ascites: Secondary | ICD-10-CM | POA: Diagnosis not present

## 2023-09-29 DIAGNOSIS — J439 Emphysema, unspecified: Secondary | ICD-10-CM | POA: Diagnosis not present

## 2023-09-29 DIAGNOSIS — M1990 Unspecified osteoarthritis, unspecified site: Secondary | ICD-10-CM | POA: Diagnosis not present

## 2023-09-29 DIAGNOSIS — K7682 Hepatic encephalopathy: Secondary | ICD-10-CM | POA: Diagnosis not present

## 2023-09-29 DIAGNOSIS — F17210 Nicotine dependence, cigarettes, uncomplicated: Secondary | ICD-10-CM | POA: Diagnosis not present

## 2023-09-29 DIAGNOSIS — M353 Polymyalgia rheumatica: Secondary | ICD-10-CM | POA: Diagnosis not present

## 2023-09-29 DIAGNOSIS — F419 Anxiety disorder, unspecified: Secondary | ICD-10-CM | POA: Diagnosis not present

## 2023-09-29 DIAGNOSIS — D649 Anemia, unspecified: Secondary | ICD-10-CM | POA: Diagnosis not present

## 2023-09-29 DIAGNOSIS — F1020 Alcohol dependence, uncomplicated: Secondary | ICD-10-CM | POA: Diagnosis not present

## 2023-09-29 DIAGNOSIS — J84112 Idiopathic pulmonary fibrosis: Secondary | ICD-10-CM | POA: Diagnosis not present

## 2023-09-29 DIAGNOSIS — R32 Unspecified urinary incontinence: Secondary | ICD-10-CM | POA: Diagnosis not present

## 2023-09-29 DIAGNOSIS — D849 Immunodeficiency, unspecified: Secondary | ICD-10-CM | POA: Diagnosis not present

## 2023-09-29 DIAGNOSIS — M069 Rheumatoid arthritis, unspecified: Secondary | ICD-10-CM | POA: Diagnosis not present

## 2023-09-29 DIAGNOSIS — I10 Essential (primary) hypertension: Secondary | ICD-10-CM | POA: Diagnosis not present

## 2023-09-29 DIAGNOSIS — D61818 Other pancytopenia: Secondary | ICD-10-CM | POA: Diagnosis not present

## 2023-09-29 DIAGNOSIS — I8510 Secondary esophageal varices without bleeding: Secondary | ICD-10-CM | POA: Diagnosis not present

## 2023-09-29 DIAGNOSIS — I6523 Occlusion and stenosis of bilateral carotid arteries: Secondary | ICD-10-CM | POA: Diagnosis not present

## 2023-10-02 DIAGNOSIS — R531 Weakness: Secondary | ICD-10-CM | POA: Diagnosis not present

## 2023-10-02 DIAGNOSIS — D649 Anemia, unspecified: Secondary | ICD-10-CM | POA: Diagnosis not present

## 2023-10-04 DIAGNOSIS — K7030 Alcoholic cirrhosis of liver without ascites: Secondary | ICD-10-CM | POA: Diagnosis not present

## 2023-10-04 DIAGNOSIS — F17210 Nicotine dependence, cigarettes, uncomplicated: Secondary | ICD-10-CM | POA: Diagnosis not present

## 2023-10-04 DIAGNOSIS — J439 Emphysema, unspecified: Secondary | ICD-10-CM | POA: Diagnosis not present

## 2023-10-04 DIAGNOSIS — H02401 Unspecified ptosis of right eyelid: Secondary | ICD-10-CM | POA: Diagnosis not present

## 2023-10-04 DIAGNOSIS — F419 Anxiety disorder, unspecified: Secondary | ICD-10-CM | POA: Diagnosis not present

## 2023-10-04 DIAGNOSIS — M353 Polymyalgia rheumatica: Secondary | ICD-10-CM | POA: Diagnosis not present

## 2023-10-04 DIAGNOSIS — I2510 Atherosclerotic heart disease of native coronary artery without angina pectoris: Secondary | ICD-10-CM | POA: Diagnosis not present

## 2023-10-04 DIAGNOSIS — D849 Immunodeficiency, unspecified: Secondary | ICD-10-CM | POA: Diagnosis not present

## 2023-10-04 DIAGNOSIS — D649 Anemia, unspecified: Secondary | ICD-10-CM | POA: Diagnosis not present

## 2023-10-04 DIAGNOSIS — I6523 Occlusion and stenosis of bilateral carotid arteries: Secondary | ICD-10-CM | POA: Diagnosis not present

## 2023-10-04 DIAGNOSIS — K3189 Other diseases of stomach and duodenum: Secondary | ICD-10-CM | POA: Diagnosis not present

## 2023-10-04 DIAGNOSIS — K760 Fatty (change of) liver, not elsewhere classified: Secondary | ICD-10-CM | POA: Diagnosis not present

## 2023-10-04 DIAGNOSIS — M069 Rheumatoid arthritis, unspecified: Secondary | ICD-10-CM | POA: Diagnosis not present

## 2023-10-04 DIAGNOSIS — M1990 Unspecified osteoarthritis, unspecified site: Secondary | ICD-10-CM | POA: Diagnosis not present

## 2023-10-04 DIAGNOSIS — K7682 Hepatic encephalopathy: Secondary | ICD-10-CM | POA: Diagnosis not present

## 2023-10-04 DIAGNOSIS — Z9181 History of falling: Secondary | ICD-10-CM | POA: Diagnosis not present

## 2023-10-04 DIAGNOSIS — D61818 Other pancytopenia: Secondary | ICD-10-CM | POA: Diagnosis not present

## 2023-10-04 DIAGNOSIS — I10 Essential (primary) hypertension: Secondary | ICD-10-CM | POA: Diagnosis not present

## 2023-10-04 DIAGNOSIS — J84112 Idiopathic pulmonary fibrosis: Secondary | ICD-10-CM | POA: Diagnosis not present

## 2023-10-04 DIAGNOSIS — E876 Hypokalemia: Secondary | ICD-10-CM | POA: Diagnosis not present

## 2023-10-04 DIAGNOSIS — E782 Mixed hyperlipidemia: Secondary | ICD-10-CM | POA: Diagnosis not present

## 2023-10-04 DIAGNOSIS — I8510 Secondary esophageal varices without bleeding: Secondary | ICD-10-CM | POA: Diagnosis not present

## 2023-10-04 DIAGNOSIS — F1020 Alcohol dependence, uncomplicated: Secondary | ICD-10-CM | POA: Diagnosis not present

## 2023-10-04 DIAGNOSIS — R32 Unspecified urinary incontinence: Secondary | ICD-10-CM | POA: Diagnosis not present

## 2023-10-05 DIAGNOSIS — D649 Anemia, unspecified: Secondary | ICD-10-CM | POA: Diagnosis not present

## 2023-10-10 ENCOUNTER — Encounter (HOSPITAL_COMMUNITY): Payer: Self-pay

## 2023-10-10 ENCOUNTER — Other Ambulatory Visit: Payer: Self-pay

## 2023-10-10 ENCOUNTER — Emergency Department (HOSPITAL_COMMUNITY)

## 2023-10-10 ENCOUNTER — Emergency Department (HOSPITAL_COMMUNITY)
Admission: EM | Admit: 2023-10-10 | Discharge: 2023-10-11 | Disposition: A | Discharge status: Home / Self Care | Attending: Emergency Medicine | Admitting: Emergency Medicine

## 2023-10-10 DIAGNOSIS — R739 Hyperglycemia, unspecified: Secondary | ICD-10-CM

## 2023-10-10 DIAGNOSIS — R7401 Elevation of levels of liver transaminase levels: Secondary | ICD-10-CM | POA: Insufficient documentation

## 2023-10-10 DIAGNOSIS — R197 Diarrhea, unspecified: Secondary | ICD-10-CM | POA: Insufficient documentation

## 2023-10-10 DIAGNOSIS — D61818 Other pancytopenia: Secondary | ICD-10-CM | POA: Diagnosis not present

## 2023-10-10 DIAGNOSIS — R42 Dizziness and giddiness: Secondary | ICD-10-CM | POA: Diagnosis not present

## 2023-10-10 DIAGNOSIS — F10129 Alcohol abuse with intoxication, unspecified: Secondary | ICD-10-CM | POA: Diagnosis not present

## 2023-10-10 DIAGNOSIS — R531 Weakness: Secondary | ICD-10-CM | POA: Diagnosis not present

## 2023-10-10 DIAGNOSIS — Y908 Blood alcohol level of 240 mg/100 ml or more: Secondary | ICD-10-CM | POA: Insufficient documentation

## 2023-10-10 DIAGNOSIS — I10 Essential (primary) hypertension: Secondary | ICD-10-CM | POA: Insufficient documentation

## 2023-10-10 DIAGNOSIS — I2510 Atherosclerotic heart disease of native coronary artery without angina pectoris: Secondary | ICD-10-CM | POA: Insufficient documentation

## 2023-10-10 DIAGNOSIS — K8020 Calculus of gallbladder without cholecystitis without obstruction: Secondary | ICD-10-CM | POA: Diagnosis not present

## 2023-10-10 DIAGNOSIS — Z79899 Other long term (current) drug therapy: Secondary | ICD-10-CM | POA: Diagnosis not present

## 2023-10-10 DIAGNOSIS — R7309 Other abnormal glucose: Secondary | ICD-10-CM | POA: Diagnosis not present

## 2023-10-10 DIAGNOSIS — R0602 Shortness of breath: Secondary | ICD-10-CM | POA: Diagnosis not present

## 2023-10-10 DIAGNOSIS — F10920 Alcohol use, unspecified with intoxication, uncomplicated: Secondary | ICD-10-CM

## 2023-10-10 DIAGNOSIS — R748 Abnormal levels of other serum enzymes: Secondary | ICD-10-CM | POA: Insufficient documentation

## 2023-10-10 DIAGNOSIS — R109 Unspecified abdominal pain: Secondary | ICD-10-CM | POA: Diagnosis not present

## 2023-10-10 DIAGNOSIS — K7460 Unspecified cirrhosis of liver: Secondary | ICD-10-CM | POA: Diagnosis not present

## 2023-10-10 LAB — CBC WITH DIFFERENTIAL/PLATELET
Abs Immature Granulocytes: 0 K/uL (ref 0.00–0.07)
Basophils Absolute: 0 K/uL (ref 0.0–0.1)
Basophils Relative: 1 %
Eosinophils Absolute: 0.1 K/uL (ref 0.0–0.5)
Eosinophils Relative: 3 %
HCT: 28.7 % — ABNORMAL LOW (ref 39.0–52.0)
Hemoglobin: 8.7 g/dL — ABNORMAL LOW (ref 13.0–17.0)
Immature Granulocytes: 0 %
Lymphocytes Relative: 45 %
Lymphs Abs: 1.4 K/uL (ref 0.7–4.0)
MCH: 23.3 pg — ABNORMAL LOW (ref 26.0–34.0)
MCHC: 30.3 g/dL (ref 30.0–36.0)
MCV: 76.7 fL — ABNORMAL LOW (ref 80.0–100.0)
Monocytes Absolute: 0.6 K/uL (ref 0.1–1.0)
Monocytes Relative: 18 %
Neutro Abs: 1.1 K/uL — ABNORMAL LOW (ref 1.7–7.7)
Neutrophils Relative %: 33 %
Platelets: 40 K/uL — ABNORMAL LOW (ref 150–400)
RBC: 3.74 MIL/uL — ABNORMAL LOW (ref 4.22–5.81)
RDW: 17 % — ABNORMAL HIGH (ref 11.5–15.5)
Smear Review: NORMAL
WBC: 3.1 K/uL — ABNORMAL LOW (ref 4.0–10.5)
nRBC: 0 % (ref 0.0–0.2)

## 2023-10-10 LAB — COMPREHENSIVE METABOLIC PANEL WITH GFR
ALT: 43 U/L (ref 0–44)
AST: 113 U/L — ABNORMAL HIGH (ref 15–41)
Albumin: 3.6 g/dL (ref 3.5–5.0)
Alkaline Phosphatase: 132 U/L — ABNORMAL HIGH (ref 38–126)
Anion gap: 12 (ref 5–15)
BUN: 9 mg/dL (ref 8–23)
CO2: 22 mmol/L (ref 22–32)
Calcium: 9.2 mg/dL (ref 8.9–10.3)
Chloride: 109 mmol/L (ref 98–111)
Creatinine, Ser: 0.78 mg/dL (ref 0.61–1.24)
GFR, Estimated: >60 mL/min (ref >60)
Glucose, Bld: 112 mg/dL — ABNORMAL HIGH (ref 70–99)
Potassium: 3.9 mmol/L (ref 3.5–5.1)
Sodium: 143 mmol/L (ref 135–145)
Total Bilirubin: 1 mg/dL (ref 0.0–1.2)
Total Protein: 6.9 g/dL (ref 6.5–8.1)

## 2023-10-10 LAB — ETHANOL: Alcohol, Ethyl (B): 259 mg/dL — ABNORMAL HIGH (ref <15)

## 2023-10-10 LAB — LIPASE, BLOOD: Lipase: 111 U/L — ABNORMAL HIGH (ref 11–51)

## 2023-10-10 MED ORDER — IOHEXOL 300 MG/ML  SOLN
100.0000 mL | Freq: Once | INTRAMUSCULAR | Status: AC | PRN
Start: 2023-10-10 — End: 2023-10-10
  Administered 2023-10-10: 100 mL via INTRAVENOUS

## 2023-10-10 NOTE — ED Triage Notes (Signed)
 BIB EMS from home for diarrhea and weakness, pt has had 2 falls at home. Denies head injury, denies blood thinners.

## 2023-10-10 NOTE — ED Provider Triage Note (Signed)
 Emergency Medicine Provider Triage Evaluation Note  Robert Greene , a 63 y.o. male  was evaluated in triage.  Pt complains of diarrhea.  He states that Child psychotherapist came over to check on him and he wants him.  He has been having some lower abdominal pain.  He also endorses drinking some morning.  Does have some cirrhosis of the liver.  Also complaining of right-sided vision loss which is a known problem for him.  No focal weakness or numbness.  Review of Systems  Positive:  Negative: See above   Physical Exam  BP 104/68 (BP Location: Right Arm)   Pulse (!) 106   Temp 98.5 F (36.9 C) (Oral)   Resp 16   Ht 5' 9 (1.753 m)   Wt 92 kg   SpO2 96%   BMI 29.95 kg/m  Gen:   Awake, no distress   Resp:  Normal effort  MSK:   Moves extremities without difficulty Other:    Medical Decision Making  Medically screening exam initiated at 6:45 PM.  Appropriate orders placed.  Robert Greene was informed that the remainder of the evaluation will be completed by another provider, this initial triage assessment does not replace that evaluation, and the importance of remaining in the ED until their evaluation is complete.     Theotis Peers Fate, [place] 10/10/23 [phone]

## 2023-10-11 NOTE — ED Provider Notes (Signed)
 [place] EMERGENCY DEPARTMENT AT [facility] Provider Note   CSN: [id number] Arrival date & time: 10/10/23  1716     Patient presents with: Diarrhea   Robert Greene is a 63 y.o. male.   The history is provided by the patient.  Diarrhea  He has history of hypertension, hyperlipidemia, cirrhosis of the liver, alcoholism, pulmonary fibrosis, coronary artery disease and comes in because of diarrhea.  He denies fever but states he did have some chills.  He denies nausea or vomiting.  He denies any sick contacts.    Prior to Admission medications   Medication Sig Start Date End Date Taking? Authorizing Provider  carvedilol  (COREG ) 3.125 MG tablet Take 1 tablet (3.125 mg total) by mouth 2 (two) times daily with a meal. 09/17/23   Rosario Eland I, MD  EPINEPHrine  0.3 mg/0.3 mL IJ SOAJ injection Use once.  If ineffective, use 2nd dose. 11/19/16   Dean Clarity, MD  etanercept (ENBREL) 50 MG/ML injection Inject 50 mg into the skin every Saturday.    [provider]  folic acid  (FOLVITE ) 1 MG tablet Take 1 tablet (1 mg total) by mouth daily. 09/18/23   Rosario Eland FERNS, MD  furosemide  (LASIX ) 20 MG tablet Take 1 tablet (20 mg total) by mouth daily as needed for fluid or edema. 09/20/23   Sebastian Toribio GAILS, MD  hydroxychloroquine  (PLAQUENIL ) 200 MG tablet Take 200 mg by mouth daily. 02/06/23   [provider]  lactulose  (CHRONULAC ) 10 GM/15ML solution Take 30 mLs (20 g total) by mouth daily as needed for mild constipation (Target 2-3 bowel movements a day to flush out ammonia). 09/20/23   Sebastian Toribio GAILS, MD  Multiple Vitamin (MULTIVITAMIN WITH MINERALS) TABS tablet Take 1 tablet by mouth daily. 09/20/23   Sebastian Toribio GAILS, MD  pantoprazole  (PROTONIX ) 40 MG tablet Take 1 tablet (40 mg total) by mouth 2 (two) times daily. 08/06/23 11/04/23  Arlice Reichert, MD  rifaximin  (XIFAXAN ) 550 MG TABS tablet Take 1 tablet (550 mg total) by mouth 2 (two) times daily.  09/20/23   Sebastian Toribio GAILS, MD  thiamine  (VITAMIN B-1) 100 MG tablet Take 1 tablet (100 mg total) by mouth daily. 09/18/23   Rosario Eland FERNS, MD    Allergies: Bee venom and Spider antivenin [s black widow (latrodec mactans) antivenin]    Review of Systems  Gastrointestinal:  Positive for diarrhea.  All other systems reviewed and are negative.   Updated Vital Signs BP 135/80 (BP Location: Right Arm)   Pulse 98   Temp 98.3 F (36.8 C) (Oral)   Resp 19   Ht 5' 9 (1.753 m)   Wt 92 kg   SpO2 98%   BMI 29.95 kg/m   Physical Exam Vitals and nursing note reviewed.   63 year old male, resting comfortably and in no acute distress. Vital signs are normal. Oxygen saturation is 98%, which is normal. Head is normocephalic and atraumatic. PERRLA, EOMI.  Lungs are clear without rales, wheezes, or rhonchi. Chest is nontender. Heart has regular rate and rhythm without murmur. Abdomen is soft, flat, nontender. Extremities have 1+ edemat. Skin is warm and dry without rash. Neurologic: Awake and alert, moves all extremities equally.  (all labs ordered are listed, but only abnormal results are displayed) Labs Reviewed  CBC WITH DIFFERENTIAL/PLATELET - Abnormal; Notable for the following components:      Result Value   WBC 3.1 (*)    RBC 3.74 (*)    Hemoglobin 8.7 (*)  HCT 28.7 (*)    MCV 76.7 (*)    MCH 23.3 (*)    RDW 17.0 (*)    Platelets 40 (*)    Neutro Abs 1.1 (*)    All other components within normal limits  COMPREHENSIVE METABOLIC PANEL WITH GFR - Abnormal; Notable for the following components:   Glucose, Bld 112 (*)    AST 113 (*)    Alkaline Phosphatase 132 (*)    All other components within normal limits  LIPASE, BLOOD - Abnormal; Notable for the following components:   Lipase 111 (*)    All other components within normal limits  ETHANOL - Abnormal; Notable for the following components:   Alcohol, Ethyl (B) 259 (*)    All other components within normal limits     Radiology: CT ABDOMEN PELVIS W CONTRAST Result Date: 10/10/2023 CLINICAL DATA:  Acute abdominal pain with diarrhea EXAM: CT ABDOMEN AND PELVIS WITH CONTRAST TECHNIQUE: Multidetector CT imaging of the abdomen and pelvis was performed using the standard protocol following bolus administration of intravenous contrast. RADIATION DOSE REDUCTION: This exam was performed according to the departmental dose-optimization program which includes automated exposure control, adjustment of the mA and/or kV according to patient size and/or use of iterative reconstruction technique. CONTRAST:  OMNIPAQUE  IOHEXOL  300 MG/ML  SOLN COMPARISON:  02/27/2023 FINDINGS: Lower chest: Mild emphysematous changes are seen. Subpleural fibrosis is noted in the right base stable from the prior exam. Hepatobiliary: Diffuse decreased attenuation in the liver is noted. Diffuse nodularity is noted without focal mass. The gallbladder shows evidence of cholelithiasis. Pancreas: Unremarkable. No pancreatic ductal dilatation or surrounding inflammatory changes. Spleen: Normal in size without focal abnormality. Adrenals/Urinary Tract: Adrenal glands are within normal limits. Kidneys demonstrate a normal enhancement pattern with normal excretion bilaterally. The bladder is well distended. No obstructive changes are seen. Stomach/Bowel: No obstructive or inflammatory changes of colon are noted. The appendix is not well visualized. Small bowel and stomach appear within normal limits. Vascular/Lymphatic: Aortic atherosclerosis. No enlarged abdominal or pelvic lymph nodes. Reproductive: Prostate is unremarkable. Other: No abdominal wall hernia or abnormality. No abdominopelvic ascites. Musculoskeletal: No acute or significant osseous findings. IMPRESSION: Cirrhotic changes of the liver. Lithiasis without complicating factors. No acute intra-abdominal abnormality is seen to correspond with the given clinical history. Electronically Signed   By: Oneil Devonshire M.D.   On: 10/10/2023 20:53     Procedures   Medications Ordered in the ED  iohexol  (OMNIPAQUE ) 300 MG/ML solution 100 mL (100 mLs Intravenous Contrast Given 10/10/23 2029)                                    Medical Decision Making  Diarrhea which is most likely from viral enteritis.  At this point, he had been in the emergency department for over 10 hours without any further episodes of diarrhea.  I have reviewed his laboratory tests, and my interpretation is elevated random glucose level, borderline elevated alkaline phosphatase, mildly elevated lipase, mildly elevated AST, stable pancytopenia.  Ethanol level consistent with legal intoxication.  None of his labs are significantly changed from baseline.  CT scan shows cirrhosis of the liver, cholelithiasis, no acute intra-abdominal pathology.  I have independently viewed the images, and agree with the radiologist's interpretation.  Patient is hungry, requesting food.  I feel he is safe for discharge.  I am encouraging him to stop drinking, but I doubt that  that will occur.     Final diagnoses:  Diarrhea of presumed infectious origin  Pancytopenia (HCC)  Alcohol intoxication, uncomplicated (HCC)  Elevated AST (SGOT)  Elevated alkaline phosphatase level  Elevated lipase  Elevated random blood glucose level    ED Discharge Orders     None          Raford Lenis, MD 10/11/23 [phone]

## 2023-10-11 NOTE — Discharge Instructions (Signed)
 If you start having more diarrhea, you may take loperamide (Imodium A-D).  You have cirrhosis of the liver from drinking too much alcohol.  Every time you drink, you are hurting your liver more.  I strongly urged you to stop drinking entirely.  I am providing resources for outpatient treatment of alcohol abuse.

## 2023-10-20 ENCOUNTER — Encounter (HOSPITAL_COMMUNITY): Payer: Self-pay | Admitting: *Deleted

## 2023-10-20 ENCOUNTER — Other Ambulatory Visit: Payer: Self-pay

## 2023-10-20 ENCOUNTER — Emergency Department (HOSPITAL_COMMUNITY)
Admission: EM | Admit: 2023-10-20 | Discharge: 2023-10-20 | Disposition: A | Discharge status: Home / Self Care | Attending: Emergency Medicine | Admitting: Emergency Medicine

## 2023-10-20 ENCOUNTER — Emergency Department (HOSPITAL_COMMUNITY)

## 2023-10-20 DIAGNOSIS — R0602 Shortness of breath: Secondary | ICD-10-CM | POA: Diagnosis not present

## 2023-10-20 DIAGNOSIS — S20212A Contusion of left front wall of thorax, initial encounter: Secondary | ICD-10-CM | POA: Diagnosis not present

## 2023-10-20 DIAGNOSIS — Z743 Need for continuous supervision: Secondary | ICD-10-CM | POA: Diagnosis not present

## 2023-10-20 DIAGNOSIS — R0781 Pleurodynia: Secondary | ICD-10-CM | POA: Diagnosis not present

## 2023-10-20 DIAGNOSIS — Y908 Blood alcohol level of 240 mg/100 ml or more: Secondary | ICD-10-CM | POA: Diagnosis not present

## 2023-10-20 DIAGNOSIS — I10 Essential (primary) hypertension: Secondary | ICD-10-CM | POA: Insufficient documentation

## 2023-10-20 DIAGNOSIS — F10920 Alcohol use, unspecified with intoxication, uncomplicated: Secondary | ICD-10-CM | POA: Diagnosis not present

## 2023-10-20 DIAGNOSIS — Z79899 Other long term (current) drug therapy: Secondary | ICD-10-CM | POA: Insufficient documentation

## 2023-10-20 DIAGNOSIS — Y92009 Unspecified place in unspecified non-institutional (private) residence as the place of occurrence of the external cause: Secondary | ICD-10-CM | POA: Insufficient documentation

## 2023-10-20 DIAGNOSIS — S299XXA Unspecified injury of thorax, initial encounter: Secondary | ICD-10-CM | POA: Diagnosis present

## 2023-10-20 DIAGNOSIS — I2510 Atherosclerotic heart disease of native coronary artery without angina pectoris: Secondary | ICD-10-CM | POA: Diagnosis not present

## 2023-10-20 DIAGNOSIS — F10929 Alcohol use, unspecified with intoxication, unspecified: Secondary | ICD-10-CM | POA: Diagnosis not present

## 2023-10-20 DIAGNOSIS — W01198A Fall on same level from slipping, tripping and stumbling with subsequent striking against other object, initial encounter: Secondary | ICD-10-CM | POA: Diagnosis not present

## 2023-10-20 DIAGNOSIS — R918 Other nonspecific abnormal finding of lung field: Secondary | ICD-10-CM | POA: Diagnosis not present

## 2023-10-20 DIAGNOSIS — R45851 Suicidal ideations: Secondary | ICD-10-CM | POA: Diagnosis not present

## 2023-10-20 DIAGNOSIS — W19XXXA Unspecified fall, initial encounter: Secondary | ICD-10-CM

## 2023-10-20 DIAGNOSIS — R0789 Other chest pain: Secondary | ICD-10-CM | POA: Diagnosis not present

## 2023-10-20 DIAGNOSIS — K7689 Other specified diseases of liver: Secondary | ICD-10-CM | POA: Diagnosis not present

## 2023-10-20 DIAGNOSIS — R1011 Right upper quadrant pain: Secondary | ICD-10-CM | POA: Diagnosis not present

## 2023-10-20 LAB — RAPID URINE DRUG SCREEN, HOSP PERFORMED
Amphetamines: NOT DETECTED
Barbiturates: NOT DETECTED
Benzodiazepines: NOT DETECTED
Cocaine: NOT DETECTED
Opiates: NOT DETECTED
Tetrahydrocannabinol: NOT DETECTED

## 2023-10-20 LAB — CBC WITH DIFFERENTIAL/PLATELET
Abs Immature Granulocytes: 0.02 K/uL (ref 0.00–0.07)
Basophils Absolute: 0.1 K/uL (ref 0.0–0.1)
Basophils Relative: 2 %
Eosinophils Absolute: 0.1 K/uL (ref 0.0–0.5)
Eosinophils Relative: 2 %
HCT: 30.9 % — ABNORMAL LOW (ref 39.0–52.0)
Hemoglobin: 9.6 g/dL — ABNORMAL LOW (ref 13.0–17.0)
Immature Granulocytes: 0 %
Lymphocytes Relative: 28 %
Lymphs Abs: 1.3 K/uL (ref 0.7–4.0)
MCH: 23.8 pg — ABNORMAL LOW (ref 26.0–34.0)
MCHC: 31.1 g/dL (ref 30.0–36.0)
MCV: 76.5 fL — ABNORMAL LOW (ref 80.0–100.0)
Monocytes Absolute: 0.8 K/uL (ref 0.1–1.0)
Monocytes Relative: 16 %
Neutro Abs: 2.5 K/uL (ref 1.7–7.7)
Neutrophils Relative %: 52 %
Platelets: 44 K/uL — ABNORMAL LOW (ref 150–400)
RBC: 4.04 MIL/uL — ABNORMAL LOW (ref 4.22–5.81)
RDW: 17.9 % — ABNORMAL HIGH (ref 11.5–15.5)
WBC: 4.8 K/uL (ref 4.0–10.5)
nRBC: 0 % (ref 0.0–0.2)

## 2023-10-20 LAB — COMPREHENSIVE METABOLIC PANEL WITH GFR
ALT: 53 U/L — ABNORMAL HIGH (ref 0–44)
AST: 134 U/L — ABNORMAL HIGH (ref 15–41)
Albumin: 3.2 g/dL — ABNORMAL LOW (ref 3.5–5.0)
Alkaline Phosphatase: 122 U/L (ref 38–126)
Anion gap: 13 (ref 5–15)
BUN: 7 mg/dL — ABNORMAL LOW (ref 8–23)
CO2: 21 mmol/L — ABNORMAL LOW (ref 22–32)
Calcium: 8.7 mg/dL — ABNORMAL LOW (ref 8.9–10.3)
Chloride: 107 mmol/L (ref 98–111)
Creatinine, Ser: 0.77 mg/dL (ref 0.61–1.24)
GFR, Estimated: >60 mL/min (ref >60)
Glucose, Bld: 113 mg/dL — ABNORMAL HIGH (ref 70–99)
Potassium: 3.1 mmol/L — ABNORMAL LOW (ref 3.5–5.1)
Sodium: 141 mmol/L (ref 135–145)
Total Bilirubin: 2 mg/dL — ABNORMAL HIGH (ref 0.0–1.2)
Total Protein: 7.3 g/dL (ref 6.5–8.1)

## 2023-10-20 LAB — MAGNESIUM: Magnesium: 2.1 mg/dL (ref 1.7–2.4)

## 2023-10-20 LAB — ETHANOL: Alcohol, Ethyl (B): 331 mg/dL (ref <15)

## 2023-10-20 LAB — TROPONIN I (HIGH SENSITIVITY)
Troponin I (High Sensitivity): 33 ng/L — ABNORMAL HIGH (ref <18)
Troponin I (High Sensitivity): 30 ng/L — ABNORMAL HIGH (ref <18)

## 2023-10-20 LAB — D-DIMER, QUANTITATIVE: D-Dimer, Quant: 8.68 ug{FEU}/mL — ABNORMAL HIGH (ref 0.00–0.50)

## 2023-10-20 LAB — BRAIN NATRIURETIC PEPTIDE: B Natriuretic Peptide: 20.6 pg/mL (ref 0.0–100.0)

## 2023-10-20 MED ORDER — ONDANSETRON HCL 4 MG/2ML IJ SOLN
4.0000 mg | Freq: Once | INTRAMUSCULAR | Status: AC
  Administered 2023-10-20: 4 mg via INTRAVENOUS
  Filled 2023-10-20: qty 2

## 2023-10-20 MED ORDER — POTASSIUM CHLORIDE CRYS ER 20 MEQ PO TBCR
40.0000 meq | EXTENDED_RELEASE_TABLET | Freq: Once | ORAL | Status: AC
  Administered 2023-10-20: 40 meq via ORAL
  Filled 2023-10-20: qty 2

## 2023-10-20 MED ORDER — IOHEXOL 350 MG/ML SOLN
75.0000 mL | Freq: Once | INTRAVENOUS | Status: AC | PRN
  Administered 2023-10-20: 75 mL via INTRAVENOUS

## 2023-10-20 MED ORDER — MORPHINE SULFATE (PF) 4 MG/ML IV SOLN
4.0000 mg | Freq: Once | INTRAVENOUS | Status: AC
  Administered 2023-10-20: 4 mg via INTRAVENOUS
  Filled 2023-10-20: qty 1

## 2023-10-20 MED ORDER — SODIUM CHLORIDE 0.9 % IV BOLUS
1000.0000 mL | Freq: Once | INTRAVENOUS | Status: AC
  Administered 2023-10-20: 1000 mL via INTRAVENOUS

## 2023-10-20 MED ORDER — ACETAMINOPHEN 500 MG PO TABS
1000.0000 mg | ORAL_TABLET | Freq: Once | ORAL | Status: AC
  Administered 2023-10-20: 1000 mg via ORAL
  Filled 2023-10-20: qty 2

## 2023-10-20 NOTE — ED Notes (Signed)
 Pt unable to void at this time.  KM

## 2023-10-20 NOTE — ED Notes (Signed)
 Please call daughter hunter with any updates contact info in patient chart

## 2023-10-20 NOTE — Discharge Instructions (Addendum)
 Evaluation here was reassuring.  Please follow-up with your primary care doctor.  If you develop worsening shortness of breath, chest pain, abdominal pain, nausea vomiting diarrhea, fever or any other concerning symptom please return to the ED for further evaluation.

## 2023-10-20 NOTE — ED Triage Notes (Signed)
 Patient presents to ed via [facility] from home , ems states patient called ems for c/o sob told ems he had multiple guns in the house and he would use them, states he fall several days ago and hit his left chest pain the bed frame, c/o increase pain with palpation and inspiration. I ask patient if he wanted to hurt himeself or anyone else and he repeatedly said no. Etoh odor on breath

## 2023-10-20 NOTE — ED Notes (Signed)
 Patient transported to CT

## 2023-10-20 NOTE — ED Notes (Signed)
 Patient transported to Ultrasound

## 2023-10-20 NOTE — ED Provider Triage Note (Signed)
 Emergency Medicine Provider Triage Evaluation Note  Robert Greene , a 64 y.o. male  was evaluated in triage.  Pt complains of left rib pain.  Fell 3 days ago and struck left chest on bed frame.  Reports some increased pain.  Has been drinking heavily this evening.  No SI/HI voiced during triage exam despite statements made to EMS.  Review of Systems  Positive: Rib pain, EtOH Negative: fever  Physical Exam  BP (!) 145/109 (BP Location: Right Arm)   Pulse (!) 103   Temp 98.7 F (37.1 C) (Oral)   Resp 20   Ht 5' 9 (1.753 m)   Wt 93 kg   SpO2 98%   BMI 30.27 kg/m  Gen:   Awake, no distress, smells strongly of EtOH on arrival Resp:  Normal effort  MSK:   Moves extremities without difficulty  Other:    Medical Decision Making  Medically screening exam initiated at 6:23 AM.  Appropriate orders placed.  Robert Greene was informed that the remainder of the evaluation will be completed by another provider, this initial triage assessment does not replace that evaluation, and the importance of remaining in the ED until their evaluation is complete.  Rib pain.  Heavily intoxicated upon arrival.  Will check labs and rib films.  No SI/HI voiced to me or triage RN.   Jarold Olam HERO, PA-C 10/20/23 0624

## 2023-10-20 NOTE — ED Notes (Signed)
 Attempted to get urine , states he can't go now

## 2023-10-20 NOTE — ED Notes (Signed)
 Patient to xray.

## 2023-10-20 NOTE — ED Provider Notes (Signed)
 [place] [facility] Provider Note   CSN: [id number] Arrival date & time: 10/20/23  9391     Patient presents with: Fall  HPI Robert Greene is a 63 y.o. male with CAD, alcoholic cirrhosis of the liver with ascites, ILD, hypertension presenting for shortness of breath and fall.  He states that he has had multiple falls in the last week most recently he fell forward and hit the left side of his chest.  He now endorses a bruise in that area with some pain there as well.  States he has been somewhat short of breath in the last week as well.  Here primarily because of the left-sided chest pain.  Also states that he did have some vodka this morning but this is not unusual for him.  EMS reported that there were multiple guns in his house.  He denies SI or HI.  {Add pertinent medical, surgical, social history, OB history to HPI:32947}  Shortness of Breath Alcohol Intoxication Associated symptoms include shortness of breath.       Prior to Admission medications   Medication Sig Start Date End Date Taking? Authorizing Provider  carvedilol  (COREG ) 3.125 MG tablet Take 1 tablet (3.125 mg total) by mouth 2 (two) times daily with a meal. 09/17/23   Rosario Eland I, MD  EPINEPHrine  0.3 mg/0.3 mL IJ SOAJ injection Use once.  If ineffective, use 2nd dose. 11/19/16   Dean Clarity, MD  etanercept (ENBREL) 50 MG/ML injection Inject 50 mg into the skin every Saturday.    [provider]  folic acid  (FOLVITE ) 1 MG tablet Take 1 tablet (1 mg total) by mouth daily. 09/18/23   Rosario Eland FERNS, MD  furosemide  (LASIX ) 20 MG tablet Take 1 tablet (20 mg total) by mouth daily as needed for fluid or edema. 09/20/23   Sebastian Toribio GAILS, MD  hydroxychloroquine  (PLAQUENIL ) 200 MG tablet Take 200 mg by mouth daily. 02/06/23   [provider]  lactulose  (CHRONULAC ) 10 GM/15ML solution Take 30 mLs (20 g total) by mouth daily as needed for mild constipation  (Target 2-3 bowel movements a day to flush out ammonia). 09/20/23   Sebastian Toribio GAILS, MD  Multiple Vitamin (MULTIVITAMIN WITH MINERALS) TABS tablet Take 1 tablet by mouth daily. 09/20/23   Sebastian Toribio GAILS, MD  pantoprazole  (PROTONIX ) 40 MG tablet Take 1 tablet (40 mg total) by mouth 2 (two) times daily. 08/06/23 11/04/23  Arlice Reichert, MD  rifaximin  (XIFAXAN ) 550 MG TABS tablet Take 1 tablet (550 mg total) by mouth 2 (two) times daily. 09/20/23   Sebastian Toribio GAILS, MD  thiamine  (VITAMIN B-1) 100 MG tablet Take 1 tablet (100 mg total) by mouth daily. 09/18/23   Rosario Eland FERNS, MD    Allergies: Bee venom and Spider antivenin [s black widow (latrodec mactans) antivenin]    Review of Systems  Respiratory:  Positive for shortness of breath.     Updated Vital Signs BP 116/80 (BP Location: Right Arm)   Pulse 93   Temp 98.7 F (37.1 C)   Resp 20   Ht 5' 9 (1.753 m)   Wt 93 kg   SpO2 100%   BMI 30.27 kg/m   Physical Exam Vitals and nursing note reviewed.  HENT:     Head: Normocephalic and atraumatic.     Mouth/Throat:     Mouth: Mucous membranes are moist.  Eyes:     General:        Right eye: No  discharge.        Left eye: No discharge.     Conjunctiva/sclera: Conjunctivae normal.  Cardiovascular:     Rate and Rhythm: Normal rate and regular rhythm.     Pulses: Normal pulses.     Heart sounds: Normal heart sounds.  Pulmonary:     Effort: Pulmonary effort is normal.     Breath sounds: Normal breath sounds.  Abdominal:     General: Abdomen is flat and protuberant. There is no distension.     Palpations: Abdomen is soft.     Tenderness: There is no abdominal tenderness.  Skin:    General: Skin is warm and dry.  Neurological:     General: No focal deficit present.  Psychiatric:        Mood and Affect: Mood normal.     (all labs ordered are listed, but only abnormal results are displayed) Labs Reviewed  CBC WITH DIFFERENTIAL/PLATELET - Abnormal; Notable for the  following components:      Result Value   RBC 4.04 (*)    Hemoglobin 9.6 (*)    HCT 30.9 (*)    MCV 76.5 (*)    MCH 23.8 (*)    RDW 17.9 (*)    Platelets 44 (*)    All other components within normal limits  ETHANOL - Abnormal; Notable for the following components:   Alcohol, Ethyl (B) 331 (*)    All other components within normal limits  COMPREHENSIVE METABOLIC PANEL WITH GFR - Abnormal; Notable for the following components:   Potassium 3.1 (*)    CO2 21 (*)    Glucose, Bld 113 (*)    BUN 7 (*)    Calcium  8.7 (*)    Albumin  3.2 (*)    AST 134 (*)    ALT 53 (*)    Total Bilirubin 2.0 (*)    All other components within normal limits  D-DIMER, QUANTITATIVE - Abnormal; Notable for the following components:   D-Dimer, Quant 8.68 (*)    All other components within normal limits  TROPONIN I (HIGH SENSITIVITY) - Abnormal; Notable for the following components:   Troponin I (High Sensitivity) 33 (*)    All other components within normal limits  TROPONIN I (HIGH SENSITIVITY) - Abnormal; Notable for the following components:   Troponin I (High Sensitivity) 30 (*)    All other components within normal limits  RAPID URINE DRUG SCREEN, HOSP PERFORMED  BRAIN NATRIURETIC PEPTIDE  MAGNESIUM     EKG: None  Radiology: US  Abdomen Limited RUQ (LIVER/GB) Result Date: 10/20/2023 EXAM: Right Upper Quadrant Abdominal Ultrasound 10/20/2023 11:59:24 AM TECHNIQUE: Real-time ultrasonography of the right upper quadrant of the abdomen was performed. COMPARISON: 08/02/2023 CLINICAL HISTORY: RUQ abdominal pain. FINDINGS: LIVER: Increase parenchymal echogenicity of the liver. Contour of the liver is nodular as noted on recent CT. Suboptimal visualization of the portal vein which appears patent with flow directed towards the liver. No intrahepatic biliary ductal dilatation. No evidence of mass. BILIARY SYSTEM: No gallbladder wall thickening, pericholecystic fluid or sonographic Murphy's sign. Small echogenic  cholesterol crystals are noted floating within the gallbladder. The common bile duct is suboptimally visualized measuring 5.6 mm. RIGHT KIDNEY: The right kidney is grossly unremarkable in appearances without evidence of hydronephrosis, echogenic calculi or worrisome mass lesions. PANCREAS: Visualized portions of the pancreas are unremarkable. OTHER: No right upper quadrant ascites. IMPRESSION: 1. Nodular hepatic contour, compatible with chronic liver disease/cirrhosis as noted on recent CT. 2. Small floating echogenic cholesterol clusters within the  gallbladder, without cholecystitis. 3. Common bile duct suboptimally visualized, measuring up to 5.6 mm. Electronically signed by: Waddell Calk MD 10/20/2023 12:09 PM EDT RP Workstation: [id number]   CT Angio Chest PE W/Cm &/Or Wo Cm Result Date: 10/20/2023 CLINICAL DATA:  Concern for acute pulmonary.  High probability. EXAM: CT ANGIOGRAPHY CHEST WITH CONTRAST TECHNIQUE: Multidetector CT imaging of the chest was performed using the standard protocol during bolus administration of intravenous contrast. Multiplanar CT image reconstructions and MIPs were obtained to evaluate the vascular anatomy. RADIATION DOSE REDUCTION: This exam was performed according to the departmental dose-optimization program which includes automated exposure control, adjustment of the mA and/or kV according to patient size and/or use of iterative reconstruction technique. CONTRAST:  75mL OMNIPAQUE  IOHEXOL  350 MG/ML SOLN COMPARISON:  02/27/2023 FINDINGS: Cardiovascular: No filling defects within the pulmonary arteries to suggest acute pulmonary embolism. Mediastinum/Nodes: Small mediastinal lymph node is similar comparison exam. Prominent hemi azygous vein. Mild diffuse esophageal thickening similar prior. No supraclavicular adenopathy. Lungs/Pleura: No pulmonary infarction. No pneumonia. No pleural fluid. No pneumothorax interstitial thickening at the lung bases. No pneumonia. No pneumothorax.  Upper Abdomen: Heterogeneous enhancement pattern within the liver. Nodular contour of the liver with enlargement the caudate lobe. Small gallstones noted. Gallbladder distended to 5.5 cm. Musculoskeletal: No aggressive osseous lesion. Benign-appearing sclerotic lesion in lower thoracic spine. Sclerotic lesion in the upper sternum. Findings unchanged from comparison exam. Review of the MIP images confirms the above findings. IMPRESSION: 1. No evidence acute pulmonary embolism. 2. No pneumonia or pulmonary infarction. 3. Mild interstitial thickening at the lung bases. 4. Heterogeneous enhancement pattern of the liver with nodular contour suggests cirrhosis. 5. Cholelithiasis with distended gallbladder. Recommend clinical correlation for cholecystitis. 6. Mild diffuse esophageal thickening. Electronically Signed   By: Jackquline Boxer M.D.   On: 10/20/2023 10:53   DG Ribs Unilateral W/Chest Left Result Date: 10/20/2023 EXAM: 1 VIEW(S) XRAY OF THE LEFT RIBS AND CHEST 10/20/2023 07:16:12 AM COMPARISON: 09/14/2023 CLINICAL HISTORY: rib pain, fall 3 days ago against bed frame. Per triage notes: Pt complains of left rib pain. Fell 3 days ago and struck left chest on bed frame. Reports some increased pain. Has been drinking heavily this evening. No SI/HI voiced during triage exam despite statements made to EMS. FINDINGS: BONES: No acute displaced rib fracture. LUNGS AND PLEURA: Similar appearance of bilateral lower lung zone parenchymal scarring. Low lung volumes. No consolidation or pulmonary edema. No pleural effusion or pneumothorax. HEART AND MEDIASTINUM: No acute abnormality of the cardiac and mediastinal silhouettes. IMPRESSION: 1. No acute left rib fracture. 2. Similar bilateral lower lung zone parenchymal scarring and low lung volumes, unchanged. Electronically signed by: Waddell Calk MD 10/20/2023 07:29 AM EDT RP Workstation: [id number]    {Document cardiac monitor, telemetry assessment procedure when  appropriate:32947} Procedures   Medications Ordered in the ED  acetaminophen  (TYLENOL ) tablet 1,000 mg (1,000 mg Oral Given 10/20/23 0809)  iohexol  (OMNIPAQUE ) 350 MG/ML injection 75 mL (75 mLs Intravenous Contrast Given 10/20/23 0958)  ondansetron  (ZOFRAN ) injection 4 mg (4 mg Intravenous Given 10/20/23 1145)  morphine (PF) 4 MG/ML injection 4 mg (4 mg Intravenous Given 10/20/23 1147)  sodium chloride  0.9 % bolus 1,000 mL (1,000 mLs Intravenous New Bag/Given 10/20/23 1145)  potassium chloride  SA (KLOR-CON  M) CR tablet 40 mEq (40 mEq Oral Given 10/20/23 1144)      {Click here for ABCD2, HEART and other calculators REFRESH Note before signing:1}  Medical Decision Making Amount and/or Complexity of Data Reviewed Labs: ordered. Radiology: ordered.  Risk OTC drugs. Prescription drug management.   Initial Impression and Ddx  Patient PMH that increases complexity of ED encounter:  ***  Interpretation of Diagnostics I independent reviewed and interpreted the labs as followed: ***  - I independently visualized the following imaging with scope of interpretation limited to determining acute life threatening conditions related to emergency care: ***, which revealed ***  Patient Reassessment and Ultimate Disposition/Management ***  Patient management required discussion with the following services or consulting groups:  {BEROCONSULT:26841}  Complexity of Problems Addressed {BEROCOPA:26833}  Additional Data Reviewed and Analyzed Further history obtained from: {BERODATA:26834}  Patient Encounter Risk Assessment {BERORISK:26838}   {Document critical care time when appropriate  Document review of labs and clinical decision tools ie CHADS2VASC2, etc  Document your independent review of radiology images and any outside records  Document your discussion with family members, caretakers and with consultants  Document social determinants of health affecting pt's  care  Document your decision making why or why not admission, treatments were needed:32947:::1}   Final diagnoses:  None    ED Discharge Orders     None

## 2023-11-09 ENCOUNTER — Emergency Department (HOSPITAL_COMMUNITY)

## 2023-11-09 ENCOUNTER — Other Ambulatory Visit: Payer: Self-pay

## 2023-11-09 ENCOUNTER — Emergency Department (HOSPITAL_COMMUNITY): Admission: EM | Admit: 2023-11-09 | Discharge: 2023-11-10 | Disposition: A | Discharge status: Home / Self Care

## 2023-11-09 DIAGNOSIS — T65891A Toxic effect of other specified substances, accidental (unintentional), initial encounter: Secondary | ICD-10-CM | POA: Insufficient documentation

## 2023-11-09 DIAGNOSIS — R600 Localized edema: Secondary | ICD-10-CM | POA: Insufficient documentation

## 2023-11-09 DIAGNOSIS — I10 Essential (primary) hypertension: Secondary | ICD-10-CM | POA: Diagnosis not present

## 2023-11-09 DIAGNOSIS — Z7951 Long term (current) use of inhaled steroids: Secondary | ICD-10-CM | POA: Insufficient documentation

## 2023-11-09 DIAGNOSIS — F10129 Alcohol abuse with intoxication, unspecified: Secondary | ICD-10-CM | POA: Diagnosis not present

## 2023-11-09 DIAGNOSIS — S0990XA Unspecified injury of head, initial encounter: Secondary | ICD-10-CM | POA: Diagnosis not present

## 2023-11-09 DIAGNOSIS — R296 Repeated falls: Secondary | ICD-10-CM | POA: Insufficient documentation

## 2023-11-09 DIAGNOSIS — F10120 Alcohol abuse with intoxication, uncomplicated: Secondary | ICD-10-CM | POA: Insufficient documentation

## 2023-11-09 DIAGNOSIS — Y908 Blood alcohol level of 240 mg/100 ml or more: Secondary | ICD-10-CM | POA: Diagnosis not present

## 2023-11-09 DIAGNOSIS — J849 Interstitial pulmonary disease, unspecified: Secondary | ICD-10-CM | POA: Diagnosis not present

## 2023-11-09 DIAGNOSIS — R251 Tremor, unspecified: Secondary | ICD-10-CM | POA: Diagnosis not present

## 2023-11-09 DIAGNOSIS — R0602 Shortness of breath: Secondary | ICD-10-CM | POA: Diagnosis not present

## 2023-11-09 DIAGNOSIS — F10920 Alcohol use, unspecified with intoxication, uncomplicated: Secondary | ICD-10-CM

## 2023-11-09 DIAGNOSIS — F17200 Nicotine dependence, unspecified, uncomplicated: Secondary | ICD-10-CM | POA: Diagnosis not present

## 2023-11-09 DIAGNOSIS — Z79899 Other long term (current) drug therapy: Secondary | ICD-10-CM | POA: Insufficient documentation

## 2023-11-09 DIAGNOSIS — R7989 Other specified abnormal findings of blood chemistry: Secondary | ICD-10-CM | POA: Insufficient documentation

## 2023-11-09 DIAGNOSIS — Z743 Need for continuous supervision: Secondary | ICD-10-CM | POA: Diagnosis not present

## 2023-11-09 DIAGNOSIS — J449 Chronic obstructive pulmonary disease, unspecified: Secondary | ICD-10-CM | POA: Diagnosis not present

## 2023-11-09 DIAGNOSIS — K7460 Unspecified cirrhosis of liver: Secondary | ICD-10-CM | POA: Diagnosis not present

## 2023-11-09 DIAGNOSIS — Z7952 Long term (current) use of systemic steroids: Secondary | ICD-10-CM | POA: Insufficient documentation

## 2023-11-09 DIAGNOSIS — R404 Transient alteration of awareness: Secondary | ICD-10-CM | POA: Diagnosis not present

## 2023-11-09 DIAGNOSIS — J8489 Other specified interstitial pulmonary diseases: Secondary | ICD-10-CM | POA: Diagnosis not present

## 2023-11-09 DIAGNOSIS — R062 Wheezing: Secondary | ICD-10-CM

## 2023-11-09 DIAGNOSIS — R4701 Aphasia: Secondary | ICD-10-CM | POA: Diagnosis not present

## 2023-11-09 DIAGNOSIS — J439 Emphysema, unspecified: Secondary | ICD-10-CM | POA: Diagnosis not present

## 2023-11-09 DIAGNOSIS — R410 Disorientation, unspecified: Secondary | ICD-10-CM | POA: Diagnosis not present

## 2023-11-09 DIAGNOSIS — R069 Unspecified abnormalities of breathing: Secondary | ICD-10-CM | POA: Diagnosis not present

## 2023-11-09 LAB — COMPREHENSIVE METABOLIC PANEL WITH GFR
ALT: 36 U/L (ref 0–44)
AST: 76 U/L — ABNORMAL HIGH (ref 15–41)
Albumin: 3.1 g/dL — ABNORMAL LOW (ref 3.5–5.0)
Alkaline Phosphatase: 119 U/L (ref 38–126)
Anion gap: 10 (ref 5–15)
BUN: 9 mg/dL (ref 8–23)
CO2: 19 mmol/L — ABNORMAL LOW (ref 22–32)
Calcium: 8.9 mg/dL (ref 8.9–10.3)
Chloride: 112 mmol/L — ABNORMAL HIGH (ref 98–111)
Creatinine, Ser: 1.23 mg/dL (ref 0.61–1.24)
GFR, Estimated: >60 mL/min (ref >60)
Glucose, Bld: 116 mg/dL — ABNORMAL HIGH (ref 70–99)
Potassium: 4 mmol/L (ref 3.5–5.1)
Sodium: 141 mmol/L (ref 135–145)
Total Bilirubin: 1.4 mg/dL — ABNORMAL HIGH (ref 0.0–1.2)
Total Protein: 6.8 g/dL (ref 6.5–8.1)

## 2023-11-09 LAB — MAGNESIUM: Magnesium: 2.3 mg/dL (ref 1.7–2.4)

## 2023-11-09 LAB — I-STAT VENOUS BLOOD GAS, ED
Acid-base deficit: 3 mmol/L — ABNORMAL HIGH (ref 0.0–2.0)
Bicarbonate: 18.9 mmol/L — ABNORMAL LOW (ref 20.0–28.0)
Calcium, Ion: 1.11 mmol/L — ABNORMAL LOW (ref 1.15–1.40)
HCT: 31 % — ABNORMAL LOW (ref 39.0–52.0)
Hemoglobin: 10.5 g/dL — ABNORMAL LOW (ref 13.0–17.0)
O2 Saturation: 83 %
Potassium: 4 mmol/L (ref 3.5–5.1)
Sodium: 144 mmol/L (ref 135–145)
TCO2: 20 mmol/L — ABNORMAL LOW (ref 22–32)
pCO2, Ven: 25.3 mmHg — ABNORMAL LOW (ref 44–60)
pH, Ven: 7.482 — ABNORMAL HIGH (ref 7.25–7.43)
pO2, Ven: 43 mmHg (ref 32–45)

## 2023-11-09 LAB — AMMONIA: Ammonia: 31 umol/L (ref 9–35)

## 2023-11-09 LAB — URINALYSIS, W/ REFLEX TO CULTURE (INFECTION SUSPECTED)
Bilirubin Urine: NEGATIVE
Glucose, UA: NEGATIVE mg/dL
Hgb urine dipstick: NEGATIVE
Ketones, ur: NEGATIVE mg/dL
Leukocytes,Ua: NEGATIVE
Nitrite: NEGATIVE
Protein, ur: NEGATIVE mg/dL
Specific Gravity, Urine: 1.003 — ABNORMAL LOW (ref 1.005–1.030)
pH: 7 (ref 5.0–8.0)

## 2023-11-09 LAB — BRAIN NATRIURETIC PEPTIDE: B Natriuretic Peptide: 49.8 pg/mL (ref 0.0–100.0)

## 2023-11-09 LAB — CBC
HCT: 30.9 % — ABNORMAL LOW (ref 39.0–52.0)
Hemoglobin: 9.6 g/dL — ABNORMAL LOW (ref 13.0–17.0)
MCH: 23.4 pg — ABNORMAL LOW (ref 26.0–34.0)
MCHC: 31.1 g/dL (ref 30.0–36.0)
MCV: 75.2 fL — ABNORMAL LOW (ref 80.0–100.0)
Platelets: 120 K/uL — ABNORMAL LOW (ref 150–400)
RBC: 4.11 MIL/uL — ABNORMAL LOW (ref 4.22–5.81)
RDW: 21.6 % — ABNORMAL HIGH (ref 11.5–15.5)
WBC: 6.1 K/uL (ref 4.0–10.5)
nRBC: 0 % (ref 0.0–0.2)

## 2023-11-09 LAB — ETHANOL: Alcohol, Ethyl (B): 255 mg/dL — ABNORMAL HIGH (ref <15)

## 2023-11-09 LAB — TROPONIN I (HIGH SENSITIVITY)
Troponin I (High Sensitivity): 18 ng/L — ABNORMAL HIGH (ref <18)
Troponin I (High Sensitivity): 17 ng/L (ref <18)

## 2023-11-09 MED ORDER — IOHEXOL 350 MG/ML SOLN
75.0000 mL | Freq: Once | INTRAVENOUS | Status: AC | PRN
  Administered 2023-11-09: 75 mL via INTRAVENOUS

## 2023-11-09 MED ORDER — ALBUTEROL SULFATE HFA 108 (90 BASE) MCG/ACT IN AERS: 1.0000 | INHALATION_SPRAY | Freq: Four times a day (QID) | RESPIRATORY_TRACT | 0 refills | Status: DC | PRN

## 2023-11-09 MED ORDER — IPRATROPIUM-ALBUTEROL 0.5-2.5 (3) MG/3ML IN SOLN
3.0000 mL | Freq: Once | RESPIRATORY_TRACT | Status: AC
  Administered 2023-11-09: 3 mL via RESPIRATORY_TRACT
  Filled 2023-11-09: qty 3

## 2023-11-09 MED ORDER — LORAZEPAM 2 MG/ML IJ SOLN
1.0000 mg | Freq: Once | INTRAMUSCULAR | Status: AC
  Administered 2023-11-09: 1 mg via INTRAVENOUS
  Filled 2023-11-09: qty 1

## 2023-11-09 MED ORDER — PREDNISONE 50 MG PO TABS: 50.0000 mg | ORAL_TABLET | Freq: Every day | ORAL | 0 refills | Status: AC

## 2023-11-09 MED ADMIN — Ipratropium-Albuterol Nebu Soln 0.5-2.5(3) MG/3ML: 3 mL | RESPIRATORY_TRACT | NDC 76204060001

## 2023-11-09 MED FILL — Ipratropium-Albuterol Nebu Soln 0.5-2.5(3) MG/3ML: 3.0000 mL | RESPIRATORY_TRACT | Qty: 3 | Status: AC

## 2023-11-09 NOTE — ED Notes (Addendum)
 Pt back from CT with labored breathing and reports increase in SOB. Pt vitals taken and stable, PA aware and at bedside to evaluate. Pt reports vape has messed me up and I need to throw it out the door. Pt head of bed elevated to help with breathing. Pt now talking to himself about smoking a pipe and throwing it out the door. Pt disoriented to time

## 2023-11-09 NOTE — ED Notes (Signed)
 Pt given urinal.  KM

## 2023-11-09 NOTE — ED Notes (Addendum)
 Pt ambulated to and from bathroom, pulse ox consistently stayed above 98% room air however pt was very unsteady and required assistance with balance. Pt refused to use urinal to pee as he urinated all over floor and bed when first arrived when he tried to use the urinal. Pt still urinated all over floor of bathroom and on gown. Pt gown changed and given blankets. Back in bed and connected to monitors. Pt still breathing heavy and shallow but reports no increase in difficulty breathing

## 2023-11-09 NOTE — ED Provider Triage Note (Signed)
 Emergency Medicine Provider Triage Evaluation Note  Robert Greene , a 62 y.o. male  was evaluated in triage.  Pt complains of shortness of breath since this evening, although potentially started yesterday.  Received 125 mg Solu-Medrol , 2 g of magnesium , 2 DuoNebs prior to arrival with minimal relief but still having some shortness of breath.  History of interstitial lung disease, hypertension, COPD, cirrhosis.  Some confusion noted and route.  He denies any chest pain..  Review of Systems  Positive: Shortness of breath, wheezing Negative: Chest pain  Physical Exam  BP 135/82   Pulse 97   Temp 98.8 F (37.1 C) (Oral)   Resp (!) 35   SpO2 100%  Gen:   Awake, no distress   Resp:  Normal effort  MSK:   Moves extremities without difficulty  Other:  Some ongoing wheezing, coarse rhonchi especially in the right lung fields, breath sounds overall clear left lung fields.  Medical Decision Making  Medically screening exam initiated at 8:35 PM.  Appropriate orders placed.  Robert Greene was informed that the remainder of the evaluation will be completed by another provider, this initial triage assessment does not replace that evaluation, and the importance of remaining in the ED until their evaluation is complete.  Workup initiated in triage    Rosan Sherlean DEL, [place] 11/09/23 2035

## 2023-11-09 NOTE — ED Triage Notes (Addendum)
 Patient arrived with EMS from home reports SOB this evening , received Solumedrol 125 mg IV , Magnesium  2 grams , and 2 doses of Duoneb prior to arrival with relief . Intermittent confusion en route , denies pain . Wheezing/crackles at arrival.

## 2023-11-09 NOTE — Discharge Instructions (Addendum)
 Use steroids and inhaler sent to pharmacy for you. Avoid breathing chemicals or other strong smells that can cause airway irritation. Follow-up with your primary care doctor. Return to the ED for new or worsening symptoms.

## 2023-11-09 NOTE — ED Provider Notes (Signed)
 [place] EMERGENCY DEPARTMENT AT [facility] Provider Note   CSN: [id number] Arrival date & time: 11/09/23  2018    Patient presents with: Shortness of Breath   Robert Greene is a 63 y.o. male history of cirrhosis, IDL, tobacco use, esophageal varices, frequent falls here for evaluation of shortness of breath.  This started this evening.  Was not doing anything prior to developing shortness of breath.  He denies any chest pain.  Received Solu-Medrol , 2 g magnesium , 2 DuoNebs with EMS.  Apparently still had some wheezing when he got here during triage and was given additional DuoNeb.  He states he feels better however feels a little shaky.  He states he did fall a few days ago.  He is unsure if he hit his head.  States he drank a little bit of vodka prior to arrival.  States he drinks alcohol daily.  States he knows he should stop but I am not sure if I want to.  States he has chronic lower extremity weakness has home health, weekly needs post be doing exercises however has not done them recently.  He denies headache, nausea, vomiting, chest pain, abdominal pain, new weakness.  He states he occasionally has some swelling in his lower extremities however this is unchanged.  He admits to a chronic cough.  Smokes tobacco daily, trying to quit- using more vapes now.  Does not use any inhalers at home.   HPI     Prior to Admission medications   Medication Sig Start Date End Date Taking? Authorizing Provider  albuterol  (VENTOLIN  HFA) 108 (90 Base) MCG/ACT inhaler Inhale 1-2 puffs into the lungs every 6 (six) hours as needed for wheezing or shortness of breath. 11/09/23  Yes Lyndel Sarate A, PA-C  predniSONE  (DELTASONE ) 50 MG tablet Take 1 tablet (50 mg total) by mouth daily for 5 days. 11/09/23 11/14/23 Yes Vici Novick A, PA-C  carvedilol  (COREG ) 3.125 MG tablet Take 1 tablet (3.125 mg total) by mouth 2 (two) times daily with a meal. 09/17/23   Rosario Eland I, MD   EPINEPHrine  0.3 mg/0.3 mL IJ SOAJ injection Use once.  If ineffective, use 2nd dose. 11/19/16   Dean Clarity, MD  etanercept (ENBREL) 50 MG/ML injection Inject 50 mg into the skin every Saturday.    [provider]  folic acid  (FOLVITE ) 1 MG tablet Take 1 tablet (1 mg total) by mouth daily. 09/18/23   Rosario Eland FERNS, MD  furosemide  (LASIX ) 20 MG tablet Take 1 tablet (20 mg total) by mouth daily as needed for fluid or edema. 09/20/23   Sebastian Toribio GAILS, MD  hydroxychloroquine  (PLAQUENIL ) 200 MG tablet Take 200 mg by mouth daily. 02/06/23   [provider]  lactulose  (CHRONULAC ) 10 GM/15ML solution Take 30 mLs (20 g total) by mouth daily as needed for mild constipation (Target 2-3 bowel movements a day to flush out ammonia). 09/20/23   Sebastian Toribio GAILS, MD  Multiple Vitamin (MULTIVITAMIN WITH MINERALS) TABS tablet Take 1 tablet by mouth daily. 09/20/23   Sebastian Toribio GAILS, MD  pantoprazole  (PROTONIX ) 40 MG tablet Take 1 tablet (40 mg total) by mouth 2 (two) times daily. 08/06/23 11/04/23  Arlice Reichert, MD  rifaximin  (XIFAXAN ) 550 MG TABS tablet Take 1 tablet (550 mg total) by mouth 2 (two) times daily. 09/20/23   Sebastian Toribio GAILS, MD  thiamine  (VITAMIN B-1) 100 MG tablet Take 1 tablet (100 mg total) by mouth daily. 09/18/23   Rosario Eland FERNS, MD  Allergies: Bee venom and Spider antivenin [s black widow (latrodec mactans) antivenin]    Review of Systems  Constitutional:  Positive for activity change, appetite change and fatigue.  HENT: Negative.    Respiratory:  Positive for cough, shortness of breath and wheezing.   Cardiovascular: Negative.   Gastrointestinal: Negative.   Genitourinary: Negative.   Musculoskeletal: Negative.   Skin: Negative.   Neurological:  Positive for weakness (chronic).  All other systems reviewed and are negative.   Updated Vital Signs BP 138/61   Pulse (!) 114   Temp 98.8 F (37.1 C) (Oral)   Resp 18   SpO2 100%   Physical  Exam Vitals and nursing note reviewed.  Constitutional:      General: He is not in acute distress.    Appearance: He is well-developed. He is not ill-appearing, toxic-appearing or diaphoretic.  HENT:     Head: Normocephalic and atraumatic.  Eyes:     Pupils: Pupils are equal, round, and reactive to light.  Cardiovascular:     Rate and Rhythm: Normal rate and regular rhythm.     Pulses: Normal pulses.          Radial pulses are 2+ on the right side and 2+ on the left side.       Dorsalis pedis pulses are 2+ on the right side and 2+ on the left side.     Heart sounds: Normal heart sounds.  Pulmonary:     Effort: Pulmonary effort is normal. No respiratory distress.     Breath sounds: Normal breath sounds.  Abdominal:     General: Bowel sounds are normal. There is no distension.     Palpations: Abdomen is soft.  Musculoskeletal:        General: Normal range of motion.     Cervical back: Normal range of motion and neck supple.     Comments: Trace pitting edema to BIL feet and ankle.  No bony tenderness, compartments soft, full range of motion  Skin:    General: Skin is warm and dry.     Capillary Refill: Capillary refill takes less than 2 seconds.     Comments: Chronic venous stasis skin changes bilateral lower extremities  Neurological:     Mental Status: He is alert.     Comments: Appears intoxicated No obvious facial droop Equal handgrip bilaterally, intact sensation Tremors bilateral hands    (all labs ordered are listed, but only abnormal results are displayed) Labs Reviewed  CBC - Abnormal; Notable for the following components:      Result Value   RBC 4.11 (*)    Hemoglobin 9.6 (*)    HCT 30.9 (*)    MCV 75.2 (*)    MCH 23.4 (*)    RDW 21.6 (*)    Platelets 120 (*)    All other components within normal limits  COMPREHENSIVE METABOLIC PANEL WITH GFR - Abnormal; Notable for the following components:   Chloride 112 (*)    CO2 19 (*)    Glucose, Bld 116 (*)     Albumin  3.1 (*)    AST 76 (*)    Total Bilirubin 1.4 (*)    All other components within normal limits  ETHANOL - Abnormal; Notable for the following components:   Alcohol, Ethyl (B) 255 (*)    All other components within normal limits  URINALYSIS, W/ REFLEX TO CULTURE (INFECTION SUSPECTED) - Abnormal; Notable for the following components:   Specific Gravity, Urine 1.003 (*)  Bacteria, UA RARE (*)    All other components within normal limits  I-STAT VENOUS BLOOD GAS, ED - Abnormal; Notable for the following components:   pH, Ven 7.482 (*)    pCO2, Ven 25.3 (*)    Bicarbonate 18.9 (*)    TCO2 20 (*)    Acid-base deficit 3.0 (*)    Calcium , Ion 1.11 (*)    HCT 31.0 (*)    Hemoglobin 10.5 (*)    All other components within normal limits  TROPONIN I (HIGH SENSITIVITY) - Abnormal; Notable for the following components:   Troponin I (High Sensitivity) 18 (*)    All other components within normal limits  AMMONIA  BRAIN NATRIURETIC PEPTIDE  MAGNESIUM   TROPONIN I (HIGH SENSITIVITY)    EKG: None  Radiology: CT Angio Chest PE W and/or Wo Contrast Result Date: 11/09/2023 EXAM: CTA CHEST 11/09/2023 11:21:54 PM TECHNIQUE: CTA of the chest was performed without and with the administration of 75 mL iohexol  (OMNIPAQUE ) 350 MG/ML injection. Multiplanar reformatted images are provided for review. MIP images are provided for review. Automated exposure control, iterative reconstruction, and/or weight based adjustment of the mA/kV was utilized to reduce the radiation dose to as low as reasonably achievable. COMPARISON: Same day x-ray and CT 10/20/2023. CLINICAL HISTORY: Pulmonary embolism (PE) suspected, high prob. sob. FINDINGS: PULMONARY ARTERIES: Pulmonary arteries are adequately opacified for evaluation. No acute pulmonary embolus. Main pulmonary artery is normal in caliber. MEDIASTINUM: The heart and pericardium demonstrate no acute abnormality. There is no acute abnormality of the thoracic aorta.  LYMPH NODES: No mediastinal, hilar or axillary lymphadenopathy. LUNGS AND PLEURA: Bibasilar scarring. Mild emphysema. No focal pneumonia. No pleural effusion or pneumothorax. UPPER ABDOMEN: Hepatic steatosis. Nodular hepatic contour suggesting cirrhosis. Trace perihepatic ascites. SOFT TISSUES AND BONES: No acute bone or soft tissue abnormality. IMPRESSION: 1. No pulmonary embolism. 2. Trace perihepatic ascites. Electronically signed by: Norman Gatlin MD 11/09/2023 11:46 PM EDT RP Workstation: [id number]   CT Cervical Spine Wo Contrast Result Date: 11/09/2023 CLINICAL DATA:  Clemens, altered level of consciousness, intoxicated EXAM: CT CERVICAL SPINE WITHOUT CONTRAST TECHNIQUE: Multidetector CT imaging of the cervical spine was performed without intravenous contrast. Multiplanar CT image reconstructions were also generated. RADIATION DOSE REDUCTION: This exam was performed according to the departmental dose-optimization program which includes automated exposure control, adjustment of the mA and/or kV according to patient size and/or use of iterative reconstruction technique. COMPARISON:  None Available. FINDINGS: Alignment: Alignment is grossly anatomic. Skull base and vertebrae: No acute fracture. No primary bone lesion or focal pathologic process. Soft tissues and spinal canal: No prevertebral fluid or swelling. No visible canal hematoma. Prominent atherosclerosis of the carotid bifurcations. Disc levels: Multilevel facet hypertrophic changes most pronounced from C2-3 through C4-5. Multilevel facet hypertrophic changes most pronounced at C5-6 and C6-7. Upper chest: Airway is patent. Emphysematous changes are seen at the lung apices. Other: Reconstructed images demonstrate no additional findings. IMPRESSION: 1. No acute cervical spine fracture. 2. Multilevel cervical degenerative changes as above. 3. Pulmonary emphysema is present. Pulmonary emphysema is an independent risk factor for lung cancer. Recommend  patient be considered for lung cancer screening. [place]  Chief Financial Officer. Screening for Lung Cancer: [place]  Licensed conveyancer. JAMA. 2021;325(10):962-970. Electronically Signed   By: Ozell Daring M.D.   On: 11/09/2023 22:41   CT Head Wo Contrast Result Date: 11/09/2023 CLINICAL DATA:  Head trauma EXAM: CT HEAD WITHOUT CONTRAST TECHNIQUE: Contiguous axial images were obtained from the base of the  skull through the vertex without intravenous contrast. RADIATION DOSE REDUCTION: This exam was performed according to the departmental dose-optimization program which includes automated exposure control, adjustment of the mA and/or kV according to patient size and/or use of iterative reconstruction technique. COMPARISON:  09/14/2023 FINDINGS: Brain: No acute infarct or hemorrhage. The lateral ventricles and midline structures are unremarkable. No acute extra-axial fluid collections. No mass effect. Vascular: No hyperdense vessel or unexpected calcification. Skull: Normal. Negative for fracture or focal lesion. Sinuses/Orbits: Polypoid mucosal thickening within the maxillary sinuses. Remaining paranasal sinuses are clear. Other: None. IMPRESSION: 1. Stable head CT, no acute intracranial process. Electronically Signed   By: Ozell Daring M.D.   On: 11/09/2023 22:37   DG Chest Portable 1 View Result Date: 11/09/2023 EXAM: 1 VIEW XRAY OF THE CHEST 11/09/2023 09:16:00 PM COMPARISON: Chest x-ray 10/20/2023, CT angio chest 10/20/2023, chest x-ray 12/26/2027. CLINICAL HISTORY: shob. History of interstitial lung disease, hypertension, COPD, cirrhosis. Some confusion noted and route. He denies any chest pain. FINDINGS: LUNGS AND PLEURA: No focal consolidation. Chronic coarsened interstitial markings with no overt pulmonary edema. No pleural effusion. No pneumothorax. No focal consolidation. HEART AND MEDIASTINUM: Atherosclerotic plaque. Unchanged cardiomediastinal silhouette.  BONES AND SOFT TISSUES: No acute osseous abnormality. IMPRESSION: 1. No acute cardiopulmonary abnormality identified. 2. Emphysema. Electronically signed by: Morgane Naveau MD 11/09/2023 09:44 PM EDT RP Workstation: [id number]     Procedures   Medications Ordered in the ED  ipratropium-albuterol  (DUONEB) 0.5-2.5 (3) MG/3ML nebulizer solution 3 mL (3 mLs Nebulization Given 11/09/23 2036)  LORazepam  (ATIVAN ) injection 1 mg (1 mg Intravenous Given 11/09/23 2229)  ipratropium-albuterol  (DUONEB) 0.5-2.5 (3) MG/3ML nebulizer solution 3 mL (3 mLs Nebulization Given 11/09/23 2229)  iohexol  (OMNIPAQUE ) 350 MG/ML injection 75 mL (75 mLs Intravenous Contrast Given 11/09/23 2322)   Here for evaluation of shortness of breath.  Multiple medical comorbidities.  Think shortness of breath began yesterday.  Is a daily EtOH user, continues to use tobacco.  Has tried to slowing down by using vapes.  States he has some chronic lower extremity swelling which is unchanged.  No fever, abdominal pain, nausea or vomiting.  No history of PE or DVT.  States he drank alcohol just prior to arrival.  EMS gave him nebulizer, magnesium , Solu-Medrol .  Received additional nebulizer in triage.  Here he is intermittently tachypneic however lung sounds improved.  Does appear intoxicated.  Had a fall yesterday.  States he falls daily due to generalized weakness. EMS stated patient had AMS.  Plan on labs, imaging, reassess  Labs and imaging personally viewed and interpreted:  UA negative for infection BNP 49 Troponin 18 Magnesium  2.3 CBC without leukocytosis, hemoglobin 9.6, similar to prior Metabolic panel mild elevation LFTs, similar to prior Ammonia 31 Ethanol 255 VBG without CO2 retention Chest x-ray with with emphysema EKG without ischemic changes  Given fall yesterday, unclear if hit head, EtOH will plan on CT head, cervical.  Patient returned from CT scan.  Admits to increased chest tightness, work of breathing,  breathing 32 times a minute.  Lungs have some very minimal wheeze.  Possible anxiety component.  Will plan on CTA chest given fall ensure no PE, rib fracture, pulmonary contusion. Orders for additional duoneb placed as he states it helped previously.  I suspect his altered mental status due to his EtOH intoxication.  He has no focal deficits on exam.  Patient reassessed.Work of breathing improved after duoneb and Ativan  however still feels SOB. Lungs clear.   Patient assessed.  No  hypoxia ambulation however very unsteady on feet.  I suspect likely due to EtOH use. Will allow to sober.  CTA neg. reassessed.  Lung sounds normal.  Will allow to sober.  Anticipate likely DC home unless respiratory status changes. Care transferred to [facility] PA who will FU on reassessment and dispo                                   Medical Decision Making Amount and/or Complexity of Data Reviewed Independent Historian: EMS External Data Reviewed: labs, radiology, ECG and notes. Labs: ordered. Decision-making details documented in ED Course. Radiology: ordered and independent interpretation performed. Decision-making details documented in ED Course. ECG/medicine tests: ordered and independent interpretation performed. Decision-making details documented in ED Course.  Risk OTC drugs. Prescription drug management. Parenteral controlled substances. Decision regarding hospitalization. Diagnosis or treatment significantly limited by social determinants of health.       Final diagnoses:  ILD (interstitial lung disease) (HCC)  Wheeze  Alcoholic intoxication without complication  Frequent falls    ED Discharge Orders          Ordered    albuterol  (VENTOLIN  HFA) 108 (90 Base) MCG/ACT inhaler  Every 6 hours PRN        11/09/23 2343    predniSONE  (DELTASONE ) 50 MG tablet  Daily        11/09/23 2343               Davian Hanshaw A, PA-C 11/09/23 2351    Neysa Caron PARAS, DO 11/12/23  [id number]

## 2023-11-09 NOTE — ED Notes (Signed)
 Pt in CT.

## 2023-11-10 NOTE — ED Provider Notes (Signed)
  4:51 AM Awake/alert, up and ambulatory with steady gait.  Did report some SOB afterwards but sats remain stable at 100%.  On repeat exam he is sitting up, eating ice and in NAD.  Now admits he mixed up a Clorox mixture to clean with yesterday, this may have contributed to some airway irritation/bronchospasm.  Work-up is reassuring today.  Inhalers and steroids sent in by prior team.  He appears stable for discharge.  Encouraged to follow-up with PCP.  Return here for new concerns.   Robert Olam HERO, PA-C 11/10/23 [id number]    Robert Charmaine FALCON, MD 11/10/23 [phone]

## 2023-11-10 NOTE — ED Notes (Signed)
 Per NT, pt reporting more difficulty breathing. Pt walked to the bathroom and was back in bed for a few minutes when he reported it was difficult to catch my breath. oxygen still 100% RA, all VSS. Upon this RN assessment, Pt sitting in bed and connected to monitors with SOB and labored breathing. Pt given ice chips per request and PA made aware. Pt appears unlabored and more comfortable.

## 2023-11-10 NOTE — ED Notes (Signed)
 Pt given sandwich bag and water, okay per PA

## 2023-11-10 NOTE — ED Notes (Signed)
 Pt appears to be sleeping, laying down in bed with blankets on and eyes closed. Equal and unlabored chest rise and fall noted. No safety concerns at this time

## 2023-11-10 NOTE — ED Notes (Signed)
 Pt resting in bed with equal and unlabored chest rise. No safety concerns at this time, pending MTF.

## 2023-11-10 NOTE — ED Notes (Signed)
 Pt called son to come and get him, son is on the way

## 2023-11-10 NOTE — ED Notes (Signed)
 Pt ambulated to restroom with assistance. Pt very short of breath and wheezing with exertion.

## 2023-11-10 NOTE — ED Notes (Signed)
 Pt ambulated to and from toilet with assistance, similar as earlier pt had to stop multiple times to take breath and was unsteady on feet requiring guidance. Pt given water when back from toilet. Reports he has walker at home that he does not use.

## 2023-11-15 DIAGNOSIS — E7800 Pure hypercholesterolemia, unspecified: Secondary | ICD-10-CM | POA: Diagnosis not present

## 2023-11-15 DIAGNOSIS — K7460 Unspecified cirrhosis of liver: Secondary | ICD-10-CM | POA: Diagnosis not present

## 2023-11-15 DIAGNOSIS — J449 Chronic obstructive pulmonary disease, unspecified: Secondary | ICD-10-CM | POA: Diagnosis not present

## 2023-11-15 DIAGNOSIS — I10 Essential (primary) hypertension: Secondary | ICD-10-CM | POA: Diagnosis not present

## 2023-11-15 DIAGNOSIS — F1010 Alcohol abuse, uncomplicated: Secondary | ICD-10-CM | POA: Diagnosis not present

## 2023-11-15 LAB — LAB REPORT - SCANNED: EGFR: 99

## 2023-11-25 ENCOUNTER — Emergency Department (HOSPITAL_COMMUNITY)

## 2023-11-25 ENCOUNTER — Inpatient Hospital Stay (HOSPITAL_COMMUNITY)
Admission: EM | Admit: 2023-11-25 | Discharge: 2023-12-03 | DRG: 442 | Disposition: A | Discharge status: Home Health | Attending: Internal Medicine | Admitting: Internal Medicine

## 2023-11-25 ENCOUNTER — Other Ambulatory Visit: Payer: Self-pay

## 2023-11-25 ENCOUNTER — Encounter (HOSPITAL_COMMUNITY): Payer: Self-pay

## 2023-11-25 DIAGNOSIS — E876 Hypokalemia: Secondary | ICD-10-CM | POA: Diagnosis present

## 2023-11-25 DIAGNOSIS — K319 Disease of stomach and duodenum, unspecified: Secondary | ICD-10-CM | POA: Diagnosis not present

## 2023-11-25 DIAGNOSIS — J8410 Pulmonary fibrosis, unspecified: Secondary | ICD-10-CM | POA: Diagnosis not present

## 2023-11-25 DIAGNOSIS — S42022A Displaced fracture of shaft of left clavicle, initial encounter for closed fracture: Secondary | ICD-10-CM | POA: Diagnosis not present

## 2023-11-25 DIAGNOSIS — K766 Portal hypertension: Secondary | ICD-10-CM | POA: Diagnosis not present

## 2023-11-25 DIAGNOSIS — I8501 Esophageal varices with bleeding: Secondary | ICD-10-CM

## 2023-11-25 DIAGNOSIS — D62 Acute posthemorrhagic anemia: Secondary | ICD-10-CM | POA: Diagnosis not present

## 2023-11-25 DIAGNOSIS — I160 Hypertensive urgency: Secondary | ICD-10-CM | POA: Diagnosis present

## 2023-11-25 DIAGNOSIS — D6959 Other secondary thrombocytopenia: Secondary | ICD-10-CM | POA: Diagnosis not present

## 2023-11-25 DIAGNOSIS — Z860100 Personal history of colon polyps, unspecified: Secondary | ICD-10-CM

## 2023-11-25 DIAGNOSIS — R000 Tachycardia, unspecified: Secondary | ICD-10-CM | POA: Diagnosis not present

## 2023-11-25 DIAGNOSIS — F419 Anxiety disorder, unspecified: Secondary | ICD-10-CM | POA: Diagnosis present

## 2023-11-25 DIAGNOSIS — D684 Acquired coagulation factor deficiency: Secondary | ICD-10-CM | POA: Diagnosis not present

## 2023-11-25 DIAGNOSIS — E7800 Pure hypercholesterolemia, unspecified: Secondary | ICD-10-CM | POA: Diagnosis present

## 2023-11-25 DIAGNOSIS — I8500 Esophageal varices without bleeding: Secondary | ICD-10-CM | POA: Diagnosis not present

## 2023-11-25 DIAGNOSIS — K920 Hematemesis: Secondary | ICD-10-CM | POA: Diagnosis not present

## 2023-11-25 DIAGNOSIS — M069 Rheumatoid arthritis, unspecified: Secondary | ICD-10-CM | POA: Diagnosis not present

## 2023-11-25 DIAGNOSIS — Z79899 Other long term (current) drug therapy: Secondary | ICD-10-CM

## 2023-11-25 DIAGNOSIS — K7682 Hepatic encephalopathy: Secondary | ICD-10-CM | POA: Diagnosis present

## 2023-11-25 DIAGNOSIS — J849 Interstitial pulmonary disease, unspecified: Secondary | ICD-10-CM | POA: Diagnosis present

## 2023-11-25 DIAGNOSIS — K3189 Other diseases of stomach and duodenum: Secondary | ICD-10-CM | POA: Diagnosis not present

## 2023-11-25 DIAGNOSIS — K7030 Alcoholic cirrhosis of liver without ascites: Secondary | ICD-10-CM | POA: Diagnosis not present

## 2023-11-25 DIAGNOSIS — F17210 Nicotine dependence, cigarettes, uncomplicated: Secondary | ICD-10-CM | POA: Diagnosis present

## 2023-11-25 DIAGNOSIS — K922 Gastrointestinal hemorrhage, unspecified: Secondary | ICD-10-CM | POA: Diagnosis not present

## 2023-11-25 DIAGNOSIS — I8510 Secondary esophageal varices without bleeding: Secondary | ICD-10-CM | POA: Diagnosis not present

## 2023-11-25 DIAGNOSIS — I10 Essential (primary) hypertension: Secondary | ICD-10-CM | POA: Diagnosis present

## 2023-11-25 DIAGNOSIS — D696 Thrombocytopenia, unspecified: Secondary | ICD-10-CM | POA: Diagnosis not present

## 2023-11-25 DIAGNOSIS — K7460 Unspecified cirrhosis of liver: Secondary | ICD-10-CM | POA: Diagnosis present

## 2023-11-25 DIAGNOSIS — K921 Melena: Secondary | ICD-10-CM | POA: Diagnosis present

## 2023-11-25 DIAGNOSIS — D649 Anemia, unspecified: Secondary | ICD-10-CM | POA: Diagnosis not present

## 2023-11-25 DIAGNOSIS — R112 Nausea with vomiting, unspecified: Secondary | ICD-10-CM | POA: Diagnosis not present

## 2023-11-25 DIAGNOSIS — R188 Other ascites: Secondary | ICD-10-CM | POA: Diagnosis present

## 2023-11-25 DIAGNOSIS — I499 Cardiac arrhythmia, unspecified: Secondary | ICD-10-CM | POA: Diagnosis not present

## 2023-11-25 DIAGNOSIS — I2510 Atherosclerotic heart disease of native coronary artery without angina pectoris: Secondary | ICD-10-CM | POA: Diagnosis present

## 2023-11-25 DIAGNOSIS — K7031 Alcoholic cirrhosis of liver with ascites: Secondary | ICD-10-CM | POA: Diagnosis not present

## 2023-11-25 DIAGNOSIS — R1110 Vomiting, unspecified: Secondary | ICD-10-CM | POA: Diagnosis not present

## 2023-11-25 DIAGNOSIS — R918 Other nonspecific abnormal finding of lung field: Secondary | ICD-10-CM | POA: Diagnosis not present

## 2023-11-25 DIAGNOSIS — F10239 Alcohol dependence with withdrawal, unspecified: Secondary | ICD-10-CM | POA: Diagnosis not present

## 2023-11-25 HISTORY — DX: Rheumatoid arthritis, unspecified: M06.9

## 2023-11-25 LAB — CBC
HCT: 25.2 % — ABNORMAL LOW (ref 39.0–52.0)
HCT: 28.1 % — ABNORMAL LOW (ref 39.0–52.0)
HCT: 25.2 % — ABNORMAL LOW (ref 39.0–52.0)
HCT: 26.4 % — ABNORMAL LOW (ref 39.0–52.0)
Hemoglobin: 7.8 g/dL — ABNORMAL LOW (ref 13.0–17.0)
Hemoglobin: 8.5 g/dL — ABNORMAL LOW (ref 13.0–17.0)
Hemoglobin: 7.5 g/dL — ABNORMAL LOW (ref 13.0–17.0)
Hemoglobin: 7.9 g/dL — ABNORMAL LOW (ref 13.0–17.0)
MCH: 23.4 pg — ABNORMAL LOW (ref 26.0–34.0)
MCH: 23.3 pg — ABNORMAL LOW (ref 26.0–34.0)
MCH: 23.2 pg — ABNORMAL LOW (ref 26.0–34.0)
MCH: 23.1 pg — ABNORMAL LOW (ref 26.0–34.0)
MCHC: 31 g/dL (ref 30.0–36.0)
MCHC: 30.2 g/dL (ref 30.0–36.0)
MCHC: 29.8 g/dL — ABNORMAL LOW (ref 30.0–36.0)
MCHC: 29.9 g/dL — ABNORMAL LOW (ref 30.0–36.0)
MCV: 75.7 fL — ABNORMAL LOW (ref 80.0–100.0)
MCV: 77 fL — ABNORMAL LOW (ref 80.0–100.0)
MCV: 78 fL — ABNORMAL LOW (ref 80.0–100.0)
MCV: 77.2 fL — ABNORMAL LOW (ref 80.0–100.0)
Platelets: 39 K/uL — ABNORMAL LOW (ref 150–400)
Platelets: 56 K/uL — ABNORMAL LOW (ref 150–400)
Platelets: 43 K/uL — ABNORMAL LOW (ref 150–400)
Platelets: 48 K/uL — ABNORMAL LOW (ref 150–400)
RBC: 3.33 MIL/uL — ABNORMAL LOW (ref 4.22–5.81)
RBC: 3.65 MIL/uL — ABNORMAL LOW (ref 4.22–5.81)
RBC: 3.23 MIL/uL — ABNORMAL LOW (ref 4.22–5.81)
RBC: 3.42 MIL/uL — ABNORMAL LOW (ref 4.22–5.81)
RDW: 22.5 % — ABNORMAL HIGH (ref 11.5–15.5)
RDW: 22.6 % — ABNORMAL HIGH (ref 11.5–15.5)
RDW: 22.5 % — ABNORMAL HIGH (ref 11.5–15.5)
RDW: 22.3 % — ABNORMAL HIGH (ref 11.5–15.5)
WBC: 4.7 K/uL (ref 4.0–10.5)
WBC: 6.2 K/uL (ref 4.0–10.5)
WBC: 3.7 K/uL — ABNORMAL LOW (ref 4.0–10.5)
WBC: 3.9 K/uL — ABNORMAL LOW (ref 4.0–10.5)
nRBC: 0 % (ref 0.0–0.2)
nRBC: 0 % (ref 0.0–0.2)
nRBC: 0 % (ref 0.0–0.2)
nRBC: 0 % (ref 0.0–0.2)

## 2023-11-25 LAB — COMPREHENSIVE METABOLIC PANEL WITH GFR
ALT: 33 U/L (ref 0–44)
ALT: 27 U/L (ref 0–44)
AST: 76 U/L — ABNORMAL HIGH (ref 15–41)
AST: 64 U/L — ABNORMAL HIGH (ref 15–41)
Albumin: 3.7 g/dL (ref 3.5–5.0)
Albumin: 3.3 g/dL — ABNORMAL LOW (ref 3.5–5.0)
Alkaline Phosphatase: 128 U/L — ABNORMAL HIGH (ref 38–126)
Alkaline Phosphatase: 111 U/L (ref 38–126)
Anion gap: 10 (ref 5–15)
Anion gap: 9 (ref 5–15)
BUN: 12 mg/dL (ref 8–23)
BUN: 12 mg/dL (ref 8–23)
CO2: 25 mmol/L (ref 22–32)
CO2: 22 mmol/L (ref 22–32)
Calcium: 9.4 mg/dL (ref 8.9–10.3)
Calcium: 8.9 mg/dL (ref 8.9–10.3)
Chloride: 101 mmol/L (ref 98–111)
Chloride: 105 mmol/L (ref 98–111)
Creatinine, Ser: 0.85 mg/dL (ref 0.61–1.24)
Creatinine, Ser: 0.72 mg/dL (ref 0.61–1.24)
GFR, Estimated: >60 mL/min (ref >60)
GFR, Estimated: >60 mL/min (ref >60)
Glucose, Bld: 131 mg/dL — ABNORMAL HIGH (ref 70–99)
Glucose, Bld: 100 mg/dL — ABNORMAL HIGH (ref 70–99)
Potassium: 3.5 mmol/L (ref 3.5–5.1)
Potassium: 3.4 mmol/L — ABNORMAL LOW (ref 3.5–5.1)
Sodium: 136 mmol/L (ref 135–145)
Sodium: 136 mmol/L (ref 135–145)
Total Bilirubin: 2.7 mg/dL — ABNORMAL HIGH (ref 0.0–1.2)
Total Bilirubin: 2.3 mg/dL — ABNORMAL HIGH (ref 0.0–1.2)
Total Protein: 7.2 g/dL (ref 6.5–8.1)
Total Protein: 6.3 g/dL — ABNORMAL LOW (ref 6.5–8.1)

## 2023-11-25 LAB — LIPASE, BLOOD: Lipase: 55 U/L — ABNORMAL HIGH (ref 11–51)

## 2023-11-25 LAB — CBC WITH DIFFERENTIAL/PLATELET
Abs Immature Granulocytes: 0.01 K/uL (ref 0.00–0.07)
Basophils Absolute: 0 K/uL (ref 0.0–0.1)
Basophils Relative: 1 %
Eosinophils Absolute: 0.1 K/uL (ref 0.0–0.5)
Eosinophils Relative: 1 %
HCT: 29.5 % — ABNORMAL LOW (ref 39.0–52.0)
Hemoglobin: 9 g/dL — ABNORMAL LOW (ref 13.0–17.0)
Immature Granulocytes: 0 %
Lymphocytes Relative: 19 %
Lymphs Abs: 0.9 K/uL (ref 0.7–4.0)
MCH: 23.3 pg — ABNORMAL LOW (ref 26.0–34.0)
MCHC: 30.5 g/dL (ref 30.0–36.0)
MCV: 76.4 fL — ABNORMAL LOW (ref 80.0–100.0)
Monocytes Absolute: 0.6 K/uL (ref 0.1–1.0)
Monocytes Relative: 12 %
Neutro Abs: 3.3 K/uL (ref 1.7–7.7)
Neutrophils Relative %: 67 %
Platelets: 47 K/uL — ABNORMAL LOW (ref 150–400)
RBC: 3.86 MIL/uL — ABNORMAL LOW (ref 4.22–5.81)
RDW: 22.5 % — ABNORMAL HIGH (ref 11.5–15.5)
WBC: 4.9 K/uL (ref 4.0–10.5)
nRBC: 0 % (ref 0.0–0.2)

## 2023-11-25 LAB — PROTIME-INR
INR: 1.2 (ref 0.8–1.2)
Prothrombin Time: 16.4 s — ABNORMAL HIGH (ref 11.4–15.2)

## 2023-11-25 LAB — TYPE AND SCREEN
ABO/RH(D): O POS
Antibody Screen: NEGATIVE

## 2023-11-25 LAB — MAGNESIUM: Magnesium: 1.8 mg/dL (ref 1.7–2.4)

## 2023-11-25 MED ORDER — THIAMINE MONONITRATE 100 MG PO TABS
100.0000 mg | ORAL_TABLET | Freq: Every day | ORAL | Status: DC
  Administered 2023-11-25 – 2023-12-03 (×9): 100 mg via ORAL
  Filled 2023-11-25 (×9): qty 1

## 2023-11-25 MED ORDER — LOSARTAN POTASSIUM 50 MG PO TABS
100.0000 mg | ORAL_TABLET | Freq: Every day | ORAL | Status: DC
  Administered 2023-11-26 – 2023-12-03 (×8): 100 mg via ORAL
  Filled 2023-11-25 (×8): qty 2

## 2023-11-25 MED ORDER — FOLIC ACID 1 MG PO TABS
1.0000 mg | ORAL_TABLET | Freq: Every day | ORAL | Status: DC
  Administered 2023-11-26 – 2023-12-03 (×8): 1 mg via ORAL
  Filled 2023-11-25 (×8): qty 1

## 2023-11-25 MED ORDER — PANTOPRAZOLE SODIUM 40 MG IV SOLR
80.0000 mg | Freq: Once | INTRAVENOUS | Status: AC
  Administered 2023-11-25: 80 mg via INTRAVENOUS
  Filled 2023-11-25: qty 20

## 2023-11-25 MED ORDER — ADULT MULTIVITAMIN W/MINERALS CH
1.0000 | ORAL_TABLET | Freq: Every day | ORAL | Status: DC
  Administered 2023-11-25 – 2023-12-03 (×8): 1 via ORAL
  Filled 2023-11-25 (×8): qty 1

## 2023-11-25 MED ORDER — LORAZEPAM 2 MG/ML IJ SOLN
1.0000 mg | INTRAMUSCULAR | Status: AC | PRN
  Administered 2023-11-25: 1 mg via INTRAVENOUS
  Administered 2023-11-26 – 2023-11-27 (×5): 2 mg via INTRAVENOUS
  Filled 2023-11-25 (×4): qty 1
  Filled 2023-11-25: qty 2
  Filled 2023-11-25: qty 1

## 2023-11-25 MED ORDER — SODIUM CHLORIDE 0.9 % IV SOLN
1.0000 g | Freq: Once | INTRAVENOUS | Status: AC
  Administered 2023-11-25: 1 g via INTRAVENOUS
  Filled 2023-11-25: qty 10

## 2023-11-25 MED ORDER — SODIUM CHLORIDE 0.9 % IV SOLN
50.0000 ug/h | INTRAVENOUS | Status: DC
Start: 2023-11-25 — End: 2023-11-28
  Administered 2023-11-25 – 2023-11-28 (×8): 50 ug/h via INTRAVENOUS
  Filled 2023-11-25 (×9): qty 1

## 2023-11-25 MED ORDER — LORAZEPAM 2 MG/ML IJ SOLN
0.0000 mg | INTRAMUSCULAR | Status: AC
  Administered 2023-11-25: 2 mg via INTRAVENOUS
  Administered 2023-11-25 (×2): 1 mg via INTRAVENOUS
  Administered 2023-11-26 (×2): 2 mg via INTRAVENOUS
  Administered 2023-11-26: 3 mg via INTRAVENOUS
  Administered 2023-11-26 (×3): 2 mg via INTRAVENOUS
  Filled 2023-11-25 (×9): qty 1

## 2023-11-25 MED ORDER — SODIUM CHLORIDE 0.9% FLUSH
3.0000 mL | Freq: Two times a day (BID) | INTRAVENOUS | Status: DC
  Administered 2023-11-25 – 2023-12-03 (×16): 3 mL via INTRAVENOUS

## 2023-11-25 MED ORDER — OCTREOTIDE LOAD VIA INFUSION
50.0000 ug | Freq: Once | INTRAVENOUS | Status: AC
  Administered 2023-11-25: 50 ug via INTRAVENOUS
  Filled 2023-11-25: qty 25

## 2023-11-25 MED ORDER — THIAMINE HCL 100 MG/ML IJ SOLN
100.0000 mg | Freq: Every day | INTRAMUSCULAR | Status: DC
  Filled 2023-11-25: qty 2

## 2023-11-25 MED ORDER — LORAZEPAM 1 MG PO TABS
1.0000 mg | ORAL_TABLET | ORAL | Status: AC | PRN
  Administered 2023-11-27: 1 mg via ORAL
  Administered 2023-11-28: 2 mg via ORAL
  Filled 2023-11-25: qty 2
  Filled 2023-11-25: qty 1

## 2023-11-25 MED ORDER — ONDANSETRON HCL 4 MG/2ML IJ SOLN: 4.0000 mg | Freq: Four times a day (QID) | INTRAMUSCULAR | Status: DC | PRN

## 2023-11-25 MED ORDER — HYDROXYCHLOROQUINE SULFATE 200 MG PO TABS
200.0000 mg | ORAL_TABLET | Freq: Every day | ORAL | Status: DC
  Administered 2023-11-26 – 2023-12-03 (×8): 200 mg via ORAL
  Filled 2023-11-25 (×9): qty 1

## 2023-11-25 MED ORDER — SODIUM CHLORIDE 0.9 % IV SOLN
1.0000 g | INTRAVENOUS | Status: DC
  Administered 2023-11-26 – 2023-12-01 (×6): 1 g via INTRAVENOUS
  Filled 2023-11-25 (×6): qty 10

## 2023-11-25 MED ORDER — ONDANSETRON HCL 4 MG PO TABS: 4.0000 mg | ORAL_TABLET | Freq: Four times a day (QID) | ORAL | Status: DC | PRN

## 2023-11-25 MED ORDER — LORAZEPAM 2 MG/ML IJ SOLN
0.0000 mg | Freq: Three times a day (TID) | INTRAMUSCULAR | Status: AC
  Administered 2023-11-27: 1 mg via INTRAVENOUS
  Administered 2023-11-27: 3 mg via INTRAVENOUS
  Filled 2023-11-25: qty 1
  Filled 2023-11-25: qty 2

## 2023-11-25 MED ORDER — SODIUM CHLORIDE 0.9 % IV SOLN: INTRAVENOUS | Status: AC

## 2023-11-25 MED ORDER — LACTULOSE 10 GM/15ML PO SOLN: 20.0000 g | Freq: Every day | ORAL | Status: DC | PRN

## 2023-11-25 MED ORDER — SODIUM CHLORIDE 0.9% IV SOLUTION: Freq: Once | INTRAVENOUS | Status: DC

## 2023-11-25 MED ORDER — PANTOPRAZOLE SODIUM 40 MG IV SOLR
40.0000 mg | Freq: Two times a day (BID) | INTRAVENOUS | Status: DC
  Administered 2023-11-25 – 2023-11-28 (×7): 40 mg via INTRAVENOUS
  Filled 2023-11-25 (×7): qty 10

## 2023-11-25 MED ORDER — ONDANSETRON HCL 4 MG/2ML IJ SOLN
4.0000 mg | Freq: Once | INTRAMUSCULAR | Status: AC
  Administered 2023-11-25: 4 mg via INTRAVENOUS
  Filled 2023-11-25: qty 2

## 2023-11-25 MED ORDER — CARVEDILOL 3.125 MG PO TABS
3.1250 mg | ORAL_TABLET | Freq: Two times a day (BID) | ORAL | Status: DC
  Administered 2023-11-26 – 2023-12-03 (×14): 3.125 mg via ORAL
  Filled 2023-11-25 (×14): qty 1

## 2023-11-25 NOTE — ED Notes (Signed)
 Ok per Dr. Fausto for pt to have clear liquid diet until midnight then pt back NPO for procedure tomorrow.

## 2023-11-25 NOTE — H&P (Signed)
 History and Physical    Robert Greene FMW:[id number] DOB: 09-Dec-1960 DOA: 11/25/2023  PCP: [name], MD   Patient coming from: Home   Chief Complaint: Rectal bleeding, N/V   HPI: Robert Greene is a 63 y.o. male with medical history significant for alcoholism, decompensated cirrhosis, rheumatoid arthritis, interstitial lung disease, and hypertension who presents with rectal bleeding and nausea with vomiting.  Patient reports 2 episodes of melena last night followed by numerous episodes of vomiting.  He denies any change in his chronic mild abdominal pain and did not see any blood in his vomit.  He has not been lightheaded or presyncopal and denies chest pain.  He continues to consume alcohol but has trouble quantifying.  ED Course: Upon arrival to the ED, patient is found to be afebrile and saturating well on room air with normal HR and elevated BP.  Labs are most notable for normal BUN, normal creatinine, bilirubin 2.7, hemoglobin 9.0, platelets 47,000, and INR 1.2.  ED physician sent a secure chat to GI (Dr. Stacia) with request for a.m. consult.  80 mg IV Protonix  and 1 g IV Rocephin  were administered in the ED.  Review of Systems:  All other systems reviewed and apart from HPI, are negative.  Past Medical History:  Diagnosis Date   Alcoholism (HCC)    Anxiety    Arthritis    rheumatoid   Cirrhosis (HCC)    Coronary artery calcification seen on CAT scan 04/08/2018   Dyspnea    HTN (hypertension) 04/08/2018   Hypercholesteremia    Hypertension    Interstitial lung disease (HCC)    Kidney stones 2020   Pulmonary fibrosis (HCC)    Rheumatoid arthritis [facility])     Past Surgical History:  Procedure Laterality Date   COLONOSCOPY N/A 08/03/2023   Procedure: COLONOSCOPY;  Surgeon: Wilhelmenia Aloha Raddle., MD;  Location: [facility];  Service: Gastroenterology;  Laterality: N/A;   ESOPHAGOGASTRODUODENOSCOPY N/A 08/03/2023   Procedure: EGD  (ESOPHAGOGASTRODUODENOSCOPY);  Surgeon: Wilhelmenia Aloha Raddle., MD;  Location: [facility];  Service: Gastroenterology;  Laterality: N/A;   ESOPHAGOGASTRODUODENOSCOPY N/A 09/16/2023   Procedure: EGD (ESOPHAGOGASTRODUODENOSCOPY);  Surgeon: Nandigam, Kavitha V, MD;  Location: [facility];  Service: Gastroenterology;  Laterality: N/A;   KNEE CARTILAGE SURGERY Left    x2   LUNG BIOPSY Right 12/06/2017   Procedure: LUNG BIOPSY;  Surgeon: Kerrin Elspeth BROCKS, MD;  Location: [facility] OR;  Service: Thoracic;  Laterality: Right;   VIDEO ASSISTED THORACOSCOPY Right 12/06/2017   Procedure: VIDEO ASSISTED THORACOSCOPY;  Surgeon: Kerrin Elspeth BROCKS, MD;  Location: [facility] OR;  Service: Thoracic;  Laterality: Right;    Social History:   reports that he has been smoking cigarettes. He has a 60 pack-year smoking history. He has never used smokeless tobacco. He reports that he does not currently use alcohol. He reports that he does not use drugs.  Allergies  Allergen Reactions   Bee Venom Anaphylaxis, Hives and Rash   Spider Antivenin [S Black Widow (Latrodec Mactans) Antivenin] Anaphylaxis, Hives and Rash    Family History  Problem Relation Age of Onset   Cirrhosis Father    Colon cancer Neg Hx    Stomach cancer Neg Hx    Esophageal cancer Neg Hx    Inflammatory bowel disease Neg Hx    Liver disease Neg Hx    Pancreatic cancer Neg Hx    Rectal cancer Neg Hx      Prior to Admission medications   Medication Sig Start Date End  Date Taking? Authorizing Provider  albuterol  (VENTOLIN  HFA) 108 (90 Base) MCG/ACT inhaler Inhale 1-2 puffs into the lungs every 6 (six) hours as needed for wheezing or shortness of breath. 11/09/23  Yes Henderly, Britni A, PA-C  carvedilol  (COREG ) 3.125 MG tablet Take 1 tablet (3.125 mg total) by mouth 2 (two) times daily with a meal. 09/17/23  Yes Rosario Leatrice FERNS, MD  etanercept (ENBREL) 50 MG/ML injection Inject 50 mg into the skin every Saturday.   Yes [provider]  folic acid  (FOLVITE ) 1 MG tablet Take 1 tablet (1 mg total) by mouth daily. 09/18/23  Yes Rosario Leatrice FERNS, MD  furosemide  (LASIX ) 20 MG tablet Take 1 tablet (20 mg total) by mouth daily as needed for fluid or edema. 09/20/23  Yes Sebastian Toribio GAILS, MD  hydroxychloroquine  (PLAQUENIL ) 200 MG tablet Take 200 mg by mouth daily. 02/06/23  Yes [provider]  lactulose  (CHRONULAC ) 10 GM/15ML solution Take 30 mLs (20 g total) by mouth daily as needed for mild constipation (Target 2-3 bowel movements a day to flush out ammonia). 09/20/23  Yes Sebastian Toribio GAILS, MD  losartan  (COZAAR ) 100 MG tablet Take 100 mg by mouth daily. 08/26/23  Yes [provider]  pantoprazole  (PROTONIX ) 40 MG tablet Take 1 tablet (40 mg total) by mouth 2 (two) times daily. 08/06/23 11/25/23 Yes Dahal, Chapman, MD  Thiamine  HCl (B-1) 100 MG TABS Take 1 tablet by mouth daily. 11/10/23  Yes [provider]  EPINEPHrine  0.3 mg/0.3 mL IJ SOAJ injection Use once.  If ineffective, use 2nd dose. Patient not taking: Reported on 11/25/2023 11/19/16   Haviland, Julie, MD  Multiple Vitamin (MULTIVITAMIN WITH MINERALS) TABS tablet Take 1 tablet by mouth daily. Patient not taking: Reported on 11/25/2023 09/20/23   Sebastian Toribio GAILS, MD  rifaximin  (XIFAXAN ) 550 MG TABS tablet Take 1 tablet (550 mg total) by mouth 2 (two) times daily. Patient not taking: Reported on 11/25/2023 09/20/23   Sebastian Toribio GAILS, MD    Physical Exam: Vitals:   11/25/23 0125 11/25/23 0126 11/25/23 0322  BP:  (!) 164/120 (!) 164/100  Pulse:  92 88  Resp:  16 15  Temp:  98.7 F (37.1 C) 98.8 F (37.1 C)  TempSrc:  Oral Oral  SpO2: 100% 99% 99%     Constitutional: NAD, no pallor or diaphoresis   Eyes: PERTLA, lids and conjunctivae normal ENMT: Mucous membranes are moist. Posterior pharynx clear of any exudate or lesions.   Neck: supple, no masses  Respiratory: no wheezing, no crackles. No accessory muscle use.  Cardiovascular: S1 &  S2 heard, regular rate and rhythm. Mild lower extremity edema.  Abdomen: soft. Non-tender. Bowel sounds active.  Musculoskeletal: no clubbing / cyanosis. No joint deformity upper and lower extremities.   Skin: no significant rashes, lesions, ulcers. Warm, dry, well-perfused. Neurologic: CN 2-12 grossly intact. Moving all extremities. Alert and oriented.  Psychiatric: Calm. Cooperative.    Labs and Imaging on Admission: I have personally reviewed following labs and imaging studies  CBC: Recent Labs  Lab 11/25/23 0136  WBC 4.9  NEUTROABS 3.3  HGB 9.0*  HCT 29.5*  MCV 76.4*  PLT 47*   Basic Metabolic Panel: Recent Labs  Lab 11/25/23 0136  NA 136  K 3.5  CL 101  CO2 25  GLUCOSE 131*  BUN 12  CREATININE 0.85  CALCIUM  9.4   GFR: CrCl cannot be calculated (Unknown ideal weight.). Liver Function Tests: Recent Labs  Lab 11/25/23 0136  AST  76*  ALT 33  ALKPHOS 128*  BILITOT 2.7*  PROT 7.2  ALBUMIN  3.7   Recent Labs  Lab 11/25/23 0136  LIPASE 55*   No results for input(s): AMMONIA in the last 168 hours. Coagulation Profile: Recent Labs  Lab 11/25/23 0136  INR 1.2   Cardiac Enzymes: No results for input(s): CKTOTAL, CKMB, CKMBINDEX, TROPONINI in the last 168 hours. BNP (last 3 results) No results for input(s): PROBNP in the last 8760 hours. HbA1C: No results for input(s): HGBA1C in the last 72 hours. CBG: No results for input(s): GLUCAP in the last 168 hours. Lipid Profile: No results for input(s): CHOL, HDL, LDLCALC, TRIG, CHOLHDL, LDLDIRECT in the last 72 hours. Thyroid  Function Tests: No results for input(s): TSH, T4TOTAL, FREET4, T3FREE, THYROIDAB in the last 72 hours. Anemia Panel: No results for input(s): VITAMINB12, FOLATE, FERRITIN, TIBC, IRON, RETICCTPCT in the last 72 hours. Urine analysis:    Component Value Date/Time   COLORURINE YELLOW 11/09/2023 2107   APPEARANCEUR CLEAR 11/09/2023 2107    LABSPEC 1.003 (L) 11/09/2023 2107   PHURINE 7.0 11/09/2023 2107   GLUCOSEU NEGATIVE 11/09/2023 2107   HGBUR NEGATIVE 11/09/2023 2107   BILIRUBINUR NEGATIVE 11/09/2023 2107   BILIRUBINUR negative 09/16/2019 1114   KETONESUR NEGATIVE 11/09/2023 2107   PROTEINUR NEGATIVE 11/09/2023 2107   UROBILINOGEN 0.2 09/16/2019 1114   UROBILINOGEN 1.0 06/10/2009 1916   NITRITE NEGATIVE 11/09/2023 2107   LEUKOCYTESUR NEGATIVE 11/09/2023 2107   Sepsis Labs: @LABRCNTIP (procalcitonin:4,lacticidven:4) )No results found for this or any previous visit (from the past 240 hours).   Radiological Exams on Admission: DG Chest Portable 1 View Result Date: 11/25/2023 CLINICAL DATA:  Vomiting with bloody stool. EXAM: PORTABLE CHEST 1 VIEW COMPARISON:  November 09, 2023 FINDINGS: The heart size and mediastinal contours are within normal limits. Mild areas of scarring and/or atelectasis are seen within the bilateral lung bases. No focal consolidation, pleural effusion or pneumothorax is identified. There is a chronic fracture deformity of the mid left clavicle. The visualized skeletal structures are otherwise unremarkable. IMPRESSION: Mild bibasilar scarring and/or atelectasis. Electronically Signed   By: Suzen Dials M.D.   On: 11/25/2023 01:55    EKG: Independently reviewed. Sinus arrhythmia, PVCs.   Assessment/Plan   1. Acute GI bleeding; anemia   - Continue IV PPI and Rocephin , start octreotide  with bolus and infusion, follow serial CBCs and transfuse as needed    2. Cirrhosis  - Continues to use alcohol - MELD-Na is 15 on admission  - Hold Lasix  while bleeding and NPO, continue beta-blocker as tolerated  3. Thrombocytopenia  - Chronic, in setting of alcoholism and cirrhosis  - Keep >50k while bleeding for now though data supporting this in cirrhotic with chronic thrombocytopenia lacking, will defer to GI    4. Rheumatoid arthritis; ILD  - Managed with Enbrel and Plaquenil , appears stable    5.  Hypertensive urgency  - Severely hypertensive in ED without target-organ damage  - Treat cautiously in setting of acute GI bleeding   6. Alcoholism  - Encourage strict avoidance (has said he wants to quit but can't), supplement vitamins, monitor for withdrawal, and use Ativan  if needed, consult TOC for any available resources to help him    DVT prophylaxis: SCDs  Code Status: Full  Level of Care: Level of care: Stepdown Family Communication: None present  Disposition Plan:  Patient is from: Home  Anticipated d/c is to: Home  Anticipated d/c date is: 11/28/23  Patient currently: Pending GI consultation, stable H&H  Consults called: GI  Admission status: Inpatient     Evalene GORMAN Sprinkles, MD [facility]  11/25/2023, 4:39 AM

## 2023-11-25 NOTE — ED Notes (Addendum)
 Per Dr. Leigh hold all meds for now. Only give coreg  if needed for HTN stop losartan . 11/25/23 0945

## 2023-11-25 NOTE — ED Provider Notes (Signed)
 [place] EMERGENCY DEPARTMENT AT [facility] Provider Note   CSN: [id number] Arrival date & time: 11/25/23  0107     History Chief Complaint  Patient presents with   GI Bleeding    HPI Robert Greene is a 63 y.o. male presenting for chief complaint of melena. HX cirrhosis. Been having nausea and vomitting. Having melena for 24 hours worsning today.   Patient's recorded medical, surgical, social, medication list and allergies were reviewed in the Snapshot window as part of the initial history.   Review of Systems   Review of Systems  Constitutional:  Positive for chills. Negative for fever.  HENT:  Negative for ear pain and sore throat.   Eyes:  Negative for pain and visual disturbance.  Respiratory:  Negative for cough and shortness of breath.   Cardiovascular:  Negative for chest pain and palpitations.  Gastrointestinal:  Negative for abdominal pain and vomiting.  Genitourinary:  Negative for dysuria and hematuria.  Musculoskeletal:  Negative for arthralgias and back pain.  Skin:  Negative for color change and rash.  Neurological:  Negative for seizures and syncope.  All other systems reviewed and are negative.   Physical Exam Updated Vital Signs BP (!) 143/83   Pulse 86   Temp 99 F (37.2 C) (Oral)   Resp 15   SpO2 98%  Physical Exam Vitals and nursing note reviewed.  Constitutional:      General: He is not in acute distress.    Appearance: He is well-developed.  HENT:     Head: Normocephalic and atraumatic.  Eyes:     Conjunctiva/sclera: Conjunctivae normal.  Cardiovascular:     Rate and Rhythm: Normal rate and regular rhythm.     Heart sounds: No murmur heard. Pulmonary:     Effort: Pulmonary effort is normal. No respiratory distress.     Breath sounds: Normal breath sounds.  Abdominal:     Palpations: Abdomen is soft.     Tenderness: There is no abdominal tenderness.  Musculoskeletal:        General: No swelling.     Cervical back:  Neck supple.  Skin:    General: Skin is warm and dry.     Capillary Refill: Capillary refill takes less than 2 seconds.  Neurological:     Mental Status: He is alert.  Psychiatric:        Mood and Affect: Mood normal.      ED Course/ Medical Decision Making/ A&P    Procedures Procedures   Medications Ordered in ED Medications  hydroxychloroquine  (PLAQUENIL ) tablet 200 mg (has no administration in time range)  carvedilol  (COREG ) tablet 3.125 mg (has no administration in time range)  losartan  (COZAAR ) tablet 100 mg (has no administration in time range)  lactulose  (CHRONULAC ) 10 GM/15ML solution 20 g (has no administration in time range)  LORazepam  (ATIVAN ) tablet 1-4 mg ( Oral See Alternative 11/25/23 0505)    Or  LORazepam  (ATIVAN ) injection 1-4 mg (1 mg Intravenous Given 11/25/23 0505)  thiamine  (VITAMIN B1) tablet 100 mg (has no administration in time range)    Or  thiamine  (VITAMIN B1) injection 100 mg (has no administration in time range)  folic acid  (FOLVITE ) tablet 1 mg (has no administration in time range)  multivitamin with minerals tablet 1 tablet (has no administration in time range)  octreotide  (SANDOSTATIN ) 2 mcg/mL load via infusion 50 mcg (50 mcg Intravenous Bolus from Bag 11/25/23 0459)    And  octreotide  (SANDOSTATIN ) 500 mcg in sodium  chloride 0.9 % 250 mL (2 mcg/mL) infusion (50 mcg/hr Intravenous New Bag/Given 11/25/23 0500)  pantoprazole  (PROTONIX ) injection 40 mg (has no administration in time range)  cefTRIAXone  (ROCEPHIN ) 1 g in sodium chloride  0.9 % 100 mL IVPB (has no administration in time range)  0.9 %  sodium chloride  infusion (Manually program via Guardrails IV Fluids) (0 mLs Intravenous Hold 11/25/23 0430)  sodium chloride  flush (NS) 0.9 % injection 3 mL (has no administration in time range)  0.9 %  sodium chloride  infusion ( Intravenous New Bag/Given 11/25/23 0439)  ondansetron  (ZOFRAN ) tablet 4 mg (has no administration in time range)    Or   ondansetron  (ZOFRAN ) injection 4 mg (has no administration in time range)  LORazepam  (ATIVAN ) injection 0-4 mg (2 mg Intravenous Given 11/25/23 0440)    Followed by  LORazepam  (ATIVAN ) injection 0-4 mg (has no administration in time range)  cefTRIAXone  (ROCEPHIN ) 1 g in sodium chloride  0.9 % 100 mL IVPB (0 g Intravenous Stopped 11/25/23 0301)  pantoprazole  (PROTONIX ) injection 80 mg (80 mg Intravenous Given 11/25/23 0200)  ondansetron  (ZOFRAN ) injection 4 mg (4 mg Intravenous Given 11/25/23 0200)    Medical Decision Making:   Robert Greene is a 63 y.o. male who presented to the ED today with melena detailed above.    Patient placed on continuous vitals and telemetry monitoring while in ED which was reviewed periodically.  Complete initial physical exam performed, notably the patient  was HDS in NAD.    Reviewed and confirmed nursing documentation for past medical history, family history, social history.    Initial Assessment:   With the patient's presentation of melena, most likely diagnosis is recurrent upper GI bleed. Other diagnoses were considered including (but not limited to) GI illness including diverticulitis, appendicitis, cholecystitis, bowel obstruction. These are considered less likely due to history of present illness and physical exam findings.   This is most consistent with an acute life/limb threatening illness complicated by underlying chronic conditions.  Initial Plan:  Rocephin /Protonix  for suspected variceal bleed Screening labs including CBC and Metabolic panel to evaluate for infectious or metabolic etiology of disease.  Urinalysis with reflex culture ordered to evaluate for UTI or relevant urologic/nephrologic pathology.  CXR to evaluate for structural/infectious intrathoracic pathology.  EKG to evaluate for cardiac pathology Objective evaluation as below reviewed   Initial Study Results:   Laboratory  All laboratory results reviewed without evidence of  clinically relevant pathology.    EKG EKG was reviewed independently. Rate, rhythm, axis, intervals all examined and without medically relevant abnormality. ST segments without concerns for elevations.    Radiology:  All images reviewed independently. Agree with radiology report at this time.   DG Chest Portable 1 View Result Date: 11/25/2023 CLINICAL DATA:  Vomiting with bloody stool. EXAM: PORTABLE CHEST 1 VIEW COMPARISON:  November 09, 2023 FINDINGS: The heart size and mediastinal contours are within normal limits. Mild areas of scarring and/or atelectasis are seen within the bilateral lung bases. No focal consolidation, pleural effusion or pneumothorax is identified. There is a chronic fracture deformity of the mid left clavicle. The visualized skeletal structures are otherwise unremarkable. IMPRESSION: Mild bibasilar scarring and/or atelectasis. Electronically Signed   By: Suzen Dials M.D.   On: 11/25/2023 01:55   CT Angio Chest PE W and/or Wo Contrast Result Date: 11/09/2023 EXAM: CTA CHEST 11/09/2023 11:21:54 PM TECHNIQUE: CTA of the chest was performed without and with the administration of 75 mL iohexol  (OMNIPAQUE ) 350 MG/ML injection. Multiplanar reformatted images are  provided for review. MIP images are provided for review. Automated exposure control, iterative reconstruction, and/or weight based adjustment of the mA/kV was utilized to reduce the radiation dose to as low as reasonably achievable. COMPARISON: Same day x-ray and CT 10/20/2023. CLINICAL HISTORY: Pulmonary embolism (PE) suspected, high prob. sob. FINDINGS: PULMONARY ARTERIES: Pulmonary arteries are adequately opacified for evaluation. No acute pulmonary embolus. Main pulmonary artery is normal in caliber. MEDIASTINUM: The heart and pericardium demonstrate no acute abnormality. There is no acute abnormality of the thoracic aorta. LYMPH NODES: No mediastinal, hilar or axillary lymphadenopathy. LUNGS AND PLEURA: Bibasilar  scarring. Mild emphysema. No focal pneumonia. No pleural effusion or pneumothorax. UPPER ABDOMEN: Hepatic steatosis. Nodular hepatic contour suggesting cirrhosis. Trace perihepatic ascites. SOFT TISSUES AND BONES: No acute bone or soft tissue abnormality. IMPRESSION: 1. No pulmonary embolism. 2. Trace perihepatic ascites. Electronically signed by: Norman Gatlin MD 11/09/2023 11:46 PM EDT RP Workstation: [id number]   CT Cervical Spine Wo Contrast Result Date: 11/09/2023 CLINICAL DATA:  Clemens, altered level of consciousness, intoxicated EXAM: CT CERVICAL SPINE WITHOUT CONTRAST TECHNIQUE: Multidetector CT imaging of the cervical spine was performed without intravenous contrast. Multiplanar CT image reconstructions were also generated. RADIATION DOSE REDUCTION: This exam was performed according to the departmental dose-optimization program which includes automated exposure control, adjustment of the mA and/or kV according to patient size and/or use of iterative reconstruction technique. COMPARISON:  None Available. FINDINGS: Alignment: Alignment is grossly anatomic. Skull base and vertebrae: No acute fracture. No primary bone lesion or focal pathologic process. Soft tissues and spinal canal: No prevertebral fluid or swelling. No visible canal hematoma. Prominent atherosclerosis of the carotid bifurcations. Disc levels: Multilevel facet hypertrophic changes most pronounced from C2-3 through C4-5. Multilevel facet hypertrophic changes most pronounced at C5-6 and C6-7. Upper chest: Airway is patent. Emphysematous changes are seen at the lung apices. Other: Reconstructed images demonstrate no additional findings. IMPRESSION: 1. No acute cervical spine fracture. 2. Multilevel cervical degenerative changes as above. 3. Pulmonary emphysema is present. Pulmonary emphysema is an independent risk factor for lung cancer. Recommend patient be considered for lung cancer screening. [place]  Chief Financial Officer. Screening for  Lung Cancer: [place]  Licensed Conveyancer. JAMA. 2021;325(10):962-970. Electronically Signed   By: Ozell Daring M.D.   On: 11/09/2023 22:41   CT Head Wo Contrast Result Date: 11/09/2023 CLINICAL DATA:  Head trauma EXAM: CT HEAD WITHOUT CONTRAST TECHNIQUE: Contiguous axial images were obtained from the base of the skull through the vertex without intravenous contrast. RADIATION DOSE REDUCTION: This exam was performed according to the departmental dose-optimization program which includes automated exposure control, adjustment of the mA and/or kV according to patient size and/or use of iterative reconstruction technique. COMPARISON:  09/14/2023 FINDINGS: Brain: No acute infarct or hemorrhage. The lateral ventricles and midline structures are unremarkable. No acute extra-axial fluid collections. No mass effect. Vascular: No hyperdense vessel or unexpected calcification. Skull: Normal. Negative for fracture or focal lesion. Sinuses/Orbits: Polypoid mucosal thickening within the maxillary sinuses. Remaining paranasal sinuses are clear. Other: None. IMPRESSION: 1. Stable head CT, no acute intracranial process. Electronically Signed   By: Ozell Daring M.D.   On: 11/09/2023 22:37   DG Chest Portable 1 View Result Date: 11/09/2023 EXAM: 1 VIEW XRAY OF THE CHEST 11/09/2023 09:16:00 PM COMPARISON: Chest x-ray 10/20/2023, CT angio chest 10/20/2023, chest x-ray 12/26/2027. CLINICAL HISTORY: shob. History of interstitial lung disease, hypertension, COPD, cirrhosis. Some confusion noted and route. He denies any chest pain. FINDINGS: LUNGS AND PLEURA:  No focal consolidation. Chronic coarsened interstitial markings with no overt pulmonary edema. No pleural effusion. No pneumothorax. No focal consolidation. HEART AND MEDIASTINUM: Atherosclerotic plaque. Unchanged cardiomediastinal silhouette. BONES AND SOFT TISSUES: No acute osseous abnormality. IMPRESSION: 1. No acute cardiopulmonary  abnormality identified. 2. Emphysema. Electronically signed by: Morgane Naveau MD 11/09/2023 09:44 PM EDT RP Workstation: [id number]     Reassessment and Plan:   Patient remained stable throughout his evaluation here in the emergency room.  Treated with medications to mitigate complications of upper GI bleed.  He remained in no acute distress.  He will need to be observed in the hospital until melena resolves.  I have messaged the [facility] gastroenterology team who follows with the patient in the outpatient setting and included the hospitalist on this message thread.  This can be safely followed up during regular consult hours unless patient decompensates overnight.  Currently well-appearing at time of repeat evaluation.   Disposition:   Based on the above findings, I believe this patient is stable for admission.    Patient/family educated about specific findings on our evaluation and explained exact reasons for admission.  Patient/family educated about clinical situation and time was allowed to answer questions.   Admission team communicated with and agreed with need for admission. Patient admitted. Patient ready to move at this time.     Emergency Department Medication Summary:   Medications  hydroxychloroquine  (PLAQUENIL ) tablet 200 mg (has no administration in time range)  carvedilol  (COREG ) tablet 3.125 mg (has no administration in time range)  losartan  (COZAAR ) tablet 100 mg (has no administration in time range)  lactulose  (CHRONULAC ) 10 GM/15ML solution 20 g (has no administration in time range)  LORazepam  (ATIVAN ) tablet 1-4 mg ( Oral See Alternative 11/25/23 0505)    Or  LORazepam  (ATIVAN ) injection 1-4 mg (1 mg Intravenous Given 11/25/23 0505)  thiamine  (VITAMIN B1) tablet 100 mg (has no administration in time range)    Or  thiamine  (VITAMIN B1) injection 100 mg (has no administration in time range)  folic acid  (FOLVITE ) tablet 1 mg (has no administration in time range)  multivitamin  with minerals tablet 1 tablet (has no administration in time range)  octreotide  (SANDOSTATIN ) 2 mcg/mL load via infusion 50 mcg (50 mcg Intravenous Bolus from Bag 11/25/23 0459)    And  octreotide  (SANDOSTATIN ) 500 mcg in sodium chloride  0.9 % 250 mL (2 mcg/mL) infusion (50 mcg/hr Intravenous New Bag/Given 11/25/23 0500)  pantoprazole  (PROTONIX ) injection 40 mg (has no administration in time range)  cefTRIAXone  (ROCEPHIN ) 1 g in sodium chloride  0.9 % 100 mL IVPB (has no administration in time range)  0.9 %  sodium chloride  infusion (Manually program via Guardrails IV Fluids) (0 mLs Intravenous Hold 11/25/23 0430)  sodium chloride  flush (NS) 0.9 % injection 3 mL (has no administration in time range)  0.9 %  sodium chloride  infusion ( Intravenous New Bag/Given 11/25/23 0439)  ondansetron  (ZOFRAN ) tablet 4 mg (has no administration in time range)    Or  ondansetron  (ZOFRAN ) injection 4 mg (has no administration in time range)  LORazepam  (ATIVAN ) injection 0-4 mg (2 mg Intravenous Given 11/25/23 0440)    Followed by  LORazepam  (ATIVAN ) injection 0-4 mg (has no administration in time range)  cefTRIAXone  (ROCEPHIN ) 1 g in sodium chloride  0.9 % 100 mL IVPB (0 g Intravenous Stopped 11/25/23 0301)  pantoprazole  (PROTONIX ) injection 80 mg (80 mg Intravenous Given 11/25/23 0200)  ondansetron  (ZOFRAN ) injection 4 mg (4 mg Intravenous Given 11/25/23 0200)  Clinical Impression:  1. Gastrointestinal hemorrhage, unspecified gastrointestinal hemorrhage type      Admit   Final Clinical Impression(s) / ED Diagnoses Final diagnoses:  Gastrointestinal hemorrhage, unspecified gastrointestinal hemorrhage type    Rx / DC Orders ED Discharge Orders     None         Jerral Meth, MD 11/25/23 [phone]

## 2023-11-25 NOTE — ED Triage Notes (Signed)
 Pt bib ems from home w/ history of cirrhosis. After eating yesterday pt started vomiting. Around 2pm pt feces was dark bloody. Ems states pt  was pale on contact. First bp was 210/100, decreased when pt laid down. Final 1748/79, hr 98. Pt given 4mg  zofram; 18 g lf ac

## 2023-11-25 NOTE — Progress Notes (Signed)
 Brief rounding note, same day as admission  HPI: Pt admitted earlier this AM for acute GI bleeding, reporting bright red blood per rectum.  GI is consulted and plans for endoscopy, likely tomorrow until urgently needed for recurrent bleeding.    Pt has not had bleeding reported since admission and Hbg is stable at 8.5 this AM   A&P: as per H&P by Dr. Charlton and per Gastroenterology, with any changes or additions as below:   --clear liquid diet today --NPO after midnight --Endoscopy likely tomorrow --Continue to trend Hbg    No charge

## 2023-11-25 NOTE — Consult Note (Addendum)
 Consultation  Referring Provider:     Evalene Sprinkles Primary Care Physician:  [name], MD Primary Gastroenterologist:        Dr. Wilhelmenia Reason for Consultation:     GI bleed         HPI:   Robert Greene is a 63 y.o. male with past medical history of rheumatoid arthritis, interstitial lung disease, decompensated alcoholic cirrhosis complicated by esophageal varices/ascites in the past, alcohol abuse, admitted to the hospital with concern for GI bleeding, we were consulted to assist with management.  He has had continued alcohol abuse over time despite his cirrhosis.  States he continues to drink alcohol and has a hard time stopping.  His last drink was on Friday it sounds like.  He is rather vague in regards to how much alcohol he is drinking, states not daily but cannot clarify how much for me today.  He was admitted last in October with a fall, was found to have an alcohol level of 188 at that time.  He also had some downtrend of his hemoglobin at the time but no overt bleeding.  He did have an EGD given history of suspected variceal bleed in July, last EGD was in August during that admission for a fall and showed that the varices were small.  However in July he came in with bright red blood per rectum reportedly and some black stool.  Had an EGD with Dr. Wilhelmenia which showed large esophageal varices that were banded.  He also had a subepithelial nodule in the small bowel.  He did undergo a colonoscopy given report of red blood per rectum, there was a vascular lesion in the colon that was treated with APC although unclear if that was clinically significant, hemorrhoids noted.  He subsequently had a capsule endoscopy to clear the small bowel which did not show any high risk or concerning pathology there.  He is on Coreg  at baseline.  Again his last EGD was in August 24 with Dr. Shila.  There was portal hypertensive gastritis and small varices noted with scarring, no high risk  lesions for bleeding at the time.  He came back last night with reports of having a few dark stools overnight.  He then had multiple episodes of vomiting.  States he thinks there was some very dark blood in it although again his history is rather vague here.  Certainly no bright red blood in his vomit or per rectum.  In the ED his hemoglobin was 9.0, BUN of 12, INR of 1.2.  Platelet count was noted to be 47 on admission.  Back in September he was also in the 40s.    He was started on IV Protonix , octreotide  drip, ceftriaxone .  He denies any bleeding or vomiting since he has been in the hospital.  This was confirmed with nursing staff.  He states he feels okay this morning but just tired.  He had a CBC checked at 430 this morning showing hemoglobin of 7.8 and his platelets had dropped to 39.  He is receiving a unit of platelets currently.  Blood pressure is stable in the 140s systolics, his heart rate is ranging anywhere from 80 to 90s during my evaluation in the exam room.  He denies any pain.  Unclear compliance of him taking his medications  Recent Endoscopic Workup: EGD 08/03/2023: - No gross lesions in the proximal esophagus and in the mid esophagus. - Grade I, grade II and grade III esophageal  varices with no bleeding and evidence of red wale signs on grade 3 varices. Banded. Completely eradicated. - Z- line irregular, 41 cm from the incisors. - 1 cm hiatal hernia. - Portal hypertensive gastropathy. - Erythematous mucosa in the stomach. Biopsied. - Subepithelial nodule found in duodenum to. - No other gross lesions in the duodenal bulb, in the first portion of the duodenum and in the second portion of the duodenum.   A. RANDOM GASTRIC BIOPSY:  - Gastric antral mucosa with nonspecific reactive gastropathy  - Gastric oxyntic mucosa with parietal cell hyperplasia as can be seen  in hypergastrinemic states such as PPI therapy.  - Helicobacter pylori-like organisms are not identified on routine HE   stain    Colonoscopy 08/03/2023:   - Hemorrhoids found on digital rectal exam. - There was significant looping of the colon with redundancy. - Hematin in the entire examined colon. Lavaged copiously. - A single mildly oozing colonic spot versus AVM. Treated with argon plasma coagulation ( APC) . - Multiple 1 to 5 mm polyps at the recto- sigmoid colon, in the sigmoid colon and in the descending colon. Resection not attempted. - Rectal varix is small. - Normal mucosa in the entire examined colon. Could not visualize any evidence of diverticulosis. - Non- bleeding non- thrombosed external and internal hemorrhoids. - The entire colon was run 3 times without any evidence of further active bleeding being found after ablation of the spot/ AVM.     08/29/2023: Complete VCE with adequate preparation. Portal gastropathy noted in stomach. Some mild edema and blunting of villi in the duodenum, possible related to portal hypertension but no other abnormalities noted in the rest of the small bowel. No evidence of small bowel findings to relay etiology for iron deficiency anemia.   EGD 09/16/23: Three columns of grade I varices with no bleeding and no stigmata of recent bleeding were found in the lower third of the esophagus, 35 to 40 cm from the incisors. They were less than 5 mm in largest diameter. No red wale signs were present. Scarring from prior treatment was visible. Findings: Mild portal hypertensive gastropathy was found in the entire examined stomach. The cardia and gastric fundus were normal on retroflexion. The examined duodenum was normal.     Past Medical History:  Diagnosis Date   Alcoholism (HCC)    Anxiety    Arthritis    rheumatoid   Cirrhosis (HCC)    Coronary artery calcification seen on CAT scan 04/08/2018   Dyspnea    HTN (hypertension) 04/08/2018   Hypercholesteremia    Hypertension    Interstitial lung disease (HCC)    Kidney stones 2020   Pulmonary fibrosis (HCC)     Rheumatoid arthritis (HCC)     Past Surgical History:  Procedure Laterality Date   COLONOSCOPY N/A 08/03/2023   Procedure: COLONOSCOPY;  Surgeon: Wilhelmenia Aloha Raddle., MD;  Location: [facility];  Service: Gastroenterology;  Laterality: N/A;   ESOPHAGOGASTRODUODENOSCOPY N/A 08/03/2023   Procedure: EGD (ESOPHAGOGASTRODUODENOSCOPY);  Surgeon: Wilhelmenia Aloha Raddle., MD;  Location: [facility];  Service: Gastroenterology;  Laterality: N/A;   ESOPHAGOGASTRODUODENOSCOPY N/A 09/16/2023   Procedure: EGD (ESOPHAGOGASTRODUODENOSCOPY);  Surgeon: Nandigam, Kavitha V, MD;  Location: [facility];  Service: Gastroenterology;  Laterality: N/A;   KNEE CARTILAGE SURGERY Left    x2   LUNG BIOPSY Right 12/06/2017   Procedure: LUNG BIOPSY;  Surgeon: Kerrin Elspeth BROCKS, MD;  Location: [facility] OR;  Service: Thoracic;  Laterality: Right;   VIDEO ASSISTED THORACOSCOPY Right 12/06/2017  Procedure: VIDEO ASSISTED THORACOSCOPY;  Surgeon: Kerrin Elspeth BROCKS, MD;  Location: [facility] OR;  Service: Thoracic;  Laterality: Right;    Family History  Problem Relation Age of Onset   Cirrhosis Father    Colon cancer Neg Hx    Stomach cancer Neg Hx    Esophageal cancer Neg Hx    Inflammatory bowel disease Neg Hx    Liver disease Neg Hx    Pancreatic cancer Neg Hx    Rectal cancer Neg Hx      Social History   Tobacco Use   Smoking status: Every Day    Current packs/day: 1.50    Average packs/day: 1.5 packs/day for 40.0 years (60.0 ttl pk-yrs)    Types: Cigarettes   Smokeless tobacco: Never   Tobacco comments:    Pt states he does smoke every now and then but not much. 02/14/22 ALS   Vaping Use   Vaping status: Every Day  Substance Use Topics   Alcohol use: Not Currently   Drug use: No    Prior to Admission medications   Medication Sig Start Date End Date Taking? Authorizing Provider  albuterol  (VENTOLIN  HFA) 108 (90 Base) MCG/ACT inhaler Inhale 1-2 puffs into the lungs every 6 (six) hours as needed for  wheezing or shortness of breath. 11/09/23  Yes Henderly, Britni A, PA-C  carvedilol  (COREG ) 3.125 MG tablet Take 1 tablet (3.125 mg total) by mouth 2 (two) times daily with a meal. 09/17/23  Yes Rosario Leatrice FERNS, MD  etanercept (ENBREL) 50 MG/ML injection Inject 50 mg into the skin every Saturday.   Yes [provider]  folic acid  (FOLVITE ) 1 MG tablet Take 1 tablet (1 mg total) by mouth daily. 09/18/23  Yes Rosario Leatrice FERNS, MD  furosemide  (LASIX ) 20 MG tablet Take 1 tablet (20 mg total) by mouth daily as needed for fluid or edema. 09/20/23  Yes Sebastian Toribio GAILS, MD  hydroxychloroquine  (PLAQUENIL ) 200 MG tablet Take 200 mg by mouth daily. 02/06/23  Yes [provider]  lactulose  (CHRONULAC ) 10 GM/15ML solution Take 30 mLs (20 g total) by mouth daily as needed for mild constipation (Target 2-3 bowel movements a day to flush out ammonia). 09/20/23  Yes Sebastian Toribio GAILS, MD  losartan  (COZAAR ) 100 MG tablet Take 100 mg by mouth daily. 08/26/23  Yes [provider]  pantoprazole  (PROTONIX ) 40 MG tablet Take 1 tablet (40 mg total) by mouth 2 (two) times daily. 08/06/23 11/25/23 Yes Dahal, Chapman, MD  Thiamine  HCl (B-1) 100 MG TABS Take 1 tablet by mouth daily. 11/10/23  Yes [provider]  EPINEPHrine  0.3 mg/0.3 mL IJ SOAJ injection Use once.  If ineffective, use 2nd dose. Patient not taking: Reported on 11/25/2023 11/19/16   Haviland, Julie, MD  Multiple Vitamin (MULTIVITAMIN WITH MINERALS) TABS tablet Take 1 tablet by mouth daily. Patient not taking: Reported on 11/25/2023 09/20/23   Sebastian Toribio GAILS, MD  rifaximin  (XIFAXAN ) 550 MG TABS tablet Take 1 tablet (550 mg total) by mouth 2 (two) times daily. Patient not taking: Reported on 11/25/2023 09/20/23   Sebastian Toribio GAILS, MD    Current Facility-Administered Medications  Medication Dose Route Frequency Provider Last Rate Last Admin   0.9 %  sodium chloride  infusion (Manually program via Guardrails IV Fluids)    Intravenous Once Opyd, Timothy S, MD   Held at 11/25/23 0430   0.9 %  sodium chloride  infusion   Intravenous Continuous Opyd, Timothy S, MD 75 mL/hr at 11/25/23 0439 New Bag  at 11/25/23 0439   carvedilol  (COREG ) tablet 3.125 mg  3.125 mg Oral BID WC Opyd, Timothy S, MD       [START ON 11/26/2023] cefTRIAXone  (ROCEPHIN ) 1 g in sodium chloride  0.9 % 100 mL IVPB  1 g Intravenous Q24H Opyd, Timothy S, MD       folic acid  (FOLVITE ) tablet 1 mg  1 mg Oral Daily Opyd, Timothy S, MD       hydroxychloroquine  (PLAQUENIL ) tablet 200 mg  200 mg Oral Daily Opyd, Timothy S, MD       lactulose  (CHRONULAC ) 10 GM/15ML solution 20 g  20 g Oral Daily PRN Opyd, Timothy S, MD       LORazepam  (ATIVAN ) injection 0-4 mg  0-4 mg Intravenous Q4H Opyd, Timothy S, MD   2 mg at 11/25/23 0440   Followed by   NOREEN ON 11/27/2023] LORazepam  (ATIVAN ) injection 0-4 mg  0-4 mg Intravenous Q8H Opyd, Timothy S, MD       LORazepam  (ATIVAN ) tablet 1-4 mg  1-4 mg Oral Q1H PRN Opyd, Timothy S, MD       Or   LORazepam  (ATIVAN ) injection 1-4 mg  1-4 mg Intravenous Q1H PRN Opyd, Timothy S, MD   1 mg at 11/25/23 0505   losartan  (COZAAR ) tablet 100 mg  100 mg Oral Daily Opyd, Timothy S, MD       multivitamin with minerals tablet 1 tablet  1 tablet Oral Daily Opyd, Timothy S, MD       octreotide  (SANDOSTATIN ) 500 mcg in sodium chloride  0.9 % 250 mL (2 mcg/mL) infusion  50 mcg/hr Intravenous Continuous Opyd, Timothy S, MD 25 mL/hr at 11/25/23 0500 50 mcg/hr at 11/25/23 0500   ondansetron  (ZOFRAN ) tablet 4 mg  4 mg Oral Q6H PRN Opyd, Timothy S, MD       Or   ondansetron  (ZOFRAN ) injection 4 mg  4 mg Intravenous Q6H PRN Opyd, Timothy S, MD       pantoprazole  (PROTONIX ) injection 40 mg  40 mg Intravenous Q12H Opyd, Timothy S, MD       sodium chloride  flush (NS) 0.9 % injection 3 mL  3 mL Intravenous Q12H Opyd, Evalene RAMAN, MD       thiamine  (VITAMIN B1) tablet 100 mg  100 mg Oral Daily Opyd, Evalene RAMAN, MD       Or   thiamine  (VITAMIN B1)  injection 100 mg  100 mg Intravenous Daily Opyd, Evalene RAMAN, MD       Current Outpatient Medications  Medication Sig Dispense Refill   albuterol  (VENTOLIN  HFA) 108 (90 Base) MCG/ACT inhaler Inhale 1-2 puffs into the lungs every 6 (six) hours as needed for wheezing or shortness of breath. 6.7 g 0   carvedilol  (COREG ) 3.125 MG tablet Take 1 tablet (3.125 mg total) by mouth 2 (two) times daily with a meal. 60 tablet 1   etanercept (ENBREL) 50 MG/ML injection Inject 50 mg into the skin every Saturday.     folic acid  (FOLVITE ) 1 MG tablet Take 1 tablet (1 mg total) by mouth daily. 30 tablet 1   furosemide  (LASIX ) 20 MG tablet Take 1 tablet (20 mg total) by mouth daily as needed for fluid or edema. 30 tablet 0   hydroxychloroquine  (PLAQUENIL ) 200 MG tablet Take 200 mg by mouth daily.     lactulose  (CHRONULAC ) 10 GM/15ML solution Take 30 mLs (20 g total) by mouth daily as needed for mild constipation (Target 2-3 bowel movements a day to flush out  ammonia). 473 mL 1   losartan  (COZAAR ) 100 MG tablet Take 100 mg by mouth daily.     pantoprazole  (PROTONIX ) 40 MG tablet Take 1 tablet (40 mg total) by mouth 2 (two) times daily. 60 tablet 2   Thiamine  HCl (B-1) 100 MG TABS Take 1 tablet by mouth daily.     EPINEPHrine  0.3 mg/0.3 mL IJ SOAJ injection Use once.  If ineffective, use 2nd dose. (Patient not taking: Reported on 11/25/2023) 2 Device 0   Multiple Vitamin (MULTIVITAMIN WITH MINERALS) TABS tablet Take 1 tablet by mouth daily. (Patient not taking: Reported on 11/25/2023)     rifaximin  (XIFAXAN ) 550 MG TABS tablet Take 1 tablet (550 mg total) by mouth 2 (two) times daily. (Patient not taking: Reported on 11/25/2023) 60 tablet 1    Allergies as of 11/25/2023 - Review Complete 11/25/2023  Allergen Reaction Noted   Bee venom Anaphylaxis, Hives, and Rash 07/25/2013   Spider antivenin [s black widow (latrodec mactans) antivenin] Anaphylaxis, Hives, and Rash 05/12/2013     Review of Systems:    As per HPI,  otherwise negative    Physical Exam:  Vital signs in last 24 hours: Temp:  [98.7 F (37.1 C)-99.1 F (37.3 C)] 99 F (37.2 C) (11/02 0757) Pulse Rate:  [71-101] 96 (11/02 0757) Resp:  [12-17] 16 (11/02 0757) BP: (120-185)/(61-120) 149/84 (11/02 0757) SpO2:  [95 %-100 %] 98 % (11/02 0752)   General:   male in NAD Head:  Normocephalic and atraumatic. Eyes:   No icterus.   Conjunctiva pale Ears:  Normal auditory acuity. Neck:  Supple Lungs:  Respirations even and unlabored. Lungs clear to auscultation bilaterally.    Heart:  Regular rate and rhythm Abdomen:  Soft, nondistended, nontender. No appreciable masses  Rectal:  Not performed.  Msk:  Symmetrical without gross deformities.  Extremities:  Without edema. Neurologic:  Alert and  oriented x4;  grossly normal neurologically. Skin:  Intact without significant lesions or rashes. Psych:  Alert and cooperative. Normal affect.  LAB RESULTS: Recent Labs    11/25/23 0136 11/25/23 0429  WBC 4.9 4.7  HGB 9.0* 7.8*  HCT 29.5* 25.2*  PLT 47* 39*   BMET Recent Labs    11/25/23 0136 11/25/23 0429  NA 136 136  K 3.5 3.4*  CL 101 105  CO2 25 22  GLUCOSE 131* 100*  BUN 12 12  CREATININE 0.85 0.72  CALCIUM  9.4 8.9   LFT Recent Labs    11/25/23 0429  PROT 6.3*  ALBUMIN  3.3*  AST 64*  ALT 27  ALKPHOS 111  BILITOT 2.3*   PT/INR Recent Labs    11/25/23 0136  LABPROT 16.4*  INR 1.2    STUDIES: DG Chest Portable 1 View Result Date: 11/25/2023 CLINICAL DATA:  Vomiting with bloody stool. EXAM: PORTABLE CHEST 1 VIEW COMPARISON:  November 09, 2023 FINDINGS: The heart size and mediastinal contours are within normal limits. Mild areas of scarring and/or atelectasis are seen within the bilateral lung bases. No focal consolidation, pleural effusion or pneumothorax is identified. There is a chronic fracture deformity of the mid left clavicle. The visualized skeletal structures are otherwise unremarkable. IMPRESSION: Mild  bibasilar scarring and/or atelectasis. Electronically Signed   By: Suzen Dials M.D.   On: 11/25/2023 01:55   CT scan abdomen pelvis and right upper quadrant ultrasound in September of this year showing no hepatomas   Impression / Plan:   63 y/o male here with the following:  Melena / Coffee ground  emesis Decompensated cirrhosis - history of esophageal varices Thrombocytopenia Alcohol abuse  As above, extensive workup back in July for bleeding, thought to be likely from varices at the time which were banded although he did have a colonic vascular lesion treated and unclear of how much that was playing a role at the time.  He presented again in August with a fall in the setting of alcohol use, EGD on August 24 showed only small varices with stigmata of scarring from banding and portal hypertensive gastritis.  He has continued to drink alcohol, has not been able to abstain.  Last drink Friday per report. Unclear of compliance with outpatient regimen (Coreg ).  Admitted with an episode of melena at home and a few episodes of what he describes as dark emesis.  No bright red blood per rectum or in his vomit.  Since coming to the hospital he has not had any further bleeding symptoms and appears hemodynamically stable.  His hemoglobin has drifted to high 7s although that is in the setting of resuscitation.  Platelets have drifted into the 30s in the setting of consumption/alcohol use.  Imaging of his liver is up-to-date, no hepatomas.  Discussed the situation with him.  He has clearly had some upper tract bleeding however his BUN is normal, hemoglobin has drifted slightly.  He appears stable at this time. Main concern would be variceal bleed, although is last EGD argued these were lower risk for bleeding. Bleeding from portal HTN gastritis / alcoholic gastritis also possible. I do think he needs an EGD during this hospitalization, the question is timing.  He got a platelet transfusion this morning for  downtrending platelets of 30s.  Will await his response initially to that and trend hemoglobin.  If he has active bleeding today he will need the EGD today.  Looking at the schedule I am told there may not be any room in Endo scheduled this morning, if he has the EGD today it may be later this afternoon.  Alternatively, if he is stable today would monitor on medical therapy and perhaps do the procedure tomorrow.  I discussed this with him for a bit and he understands and agrees.  Will keep n.p.o. for now while we await his course today.  Otherwise continue octreotide , IV PPI, antiemetics as needed.  On ceftriaxone .  Will recheck his labs this morning to await response to platelet transfusion and reassess his hemoglobin and make further determinations about the plan today based on that result.  Please call us  if he has any obvious recurrent bleeding in the interim.  PLAN: - NPO today - will need EGD at some point, question is timing, as outlined above - awaiting repeat CBC this AM, assess response to platelet transfusion, trend Hgb - continue IV protonix  - continue octreotide  drip - continue ceftriaxone  - Zofran  PRN - continue thiamine  - hold Coreg  today while we await his course - monitor for EtOH withdrawal. Abstinence recommended, he has not been able to do this over time - call with any recurrent overt bleeding  GI service will follow, call with questions.  I spent 75 minutes of time, including in depth chart review, face-to-face time with the patient, coordinating care, and documentation.     Marcey Naval, MD [facility]   ADDENDUM - 1100 AM CBC repeated, hemoglobin stable in the eights, platelets have responded to transfusion, now in the 50s now.  He has not had any bleeding overnight since he has been in the hospital, hemodynamically stable,  mildly hypertensive.  I spoke with endoscopy about possible EGD today, there are other cases already scheduled in the unit and  in the OR and I am told his case potentially would not happen until late PM or evening.  Given he is stable currently, we will plan on EGD tomorrow, likely in the afternoon with Dr. Avram.  If he has any overt significant rebleeding today in the interim please contact me.  Otherwise if he remains stable can give him some clear liquids midday and then make n.p.o. after midnight.  Continue PPI, octreotide , ceftriaxone .  If hypertensive would give Coreg  however in the setting of recent bleeding would prefer to monitor if possible without antihypertensives while we await his course today, in case he has rebleeding.  I spoke with the patient's nurse and primary team about updated plan.  Call with questions.  Marcey Naval, MD [facility]

## 2023-11-26 DIAGNOSIS — K7682 Hepatic encephalopathy: Secondary | ICD-10-CM

## 2023-11-26 DIAGNOSIS — K7031 Alcoholic cirrhosis of liver with ascites: Secondary | ICD-10-CM | POA: Diagnosis not present

## 2023-11-26 DIAGNOSIS — D62 Acute posthemorrhagic anemia: Secondary | ICD-10-CM

## 2023-11-26 DIAGNOSIS — K921 Melena: Secondary | ICD-10-CM | POA: Diagnosis not present

## 2023-11-26 DIAGNOSIS — K922 Gastrointestinal hemorrhage, unspecified: Secondary | ICD-10-CM | POA: Diagnosis not present

## 2023-11-26 LAB — COMPREHENSIVE METABOLIC PANEL WITH GFR
ALT: 30 U/L (ref 0–44)
AST: 84 U/L — ABNORMAL HIGH (ref 15–41)
Albumin: 3.4 g/dL — ABNORMAL LOW (ref 3.5–5.0)
Alkaline Phosphatase: 110 U/L (ref 38–126)
Anion gap: 11 (ref 5–15)
BUN: 11 mg/dL (ref 8–23)
CO2: 21 mmol/L — ABNORMAL LOW (ref 22–32)
Calcium: 8.7 mg/dL — ABNORMAL LOW (ref 8.9–10.3)
Chloride: 106 mmol/L (ref 98–111)
Creatinine, Ser: 0.87 mg/dL (ref 0.61–1.24)
GFR, Estimated: >60 mL/min (ref >60)
Glucose, Bld: 87 mg/dL (ref 70–99)
Potassium: 3.5 mmol/L (ref 3.5–5.1)
Sodium: 138 mmol/L (ref 135–145)
Total Bilirubin: 2.1 mg/dL — ABNORMAL HIGH (ref 0.0–1.2)
Total Protein: 6.3 g/dL — ABNORMAL LOW (ref 6.5–8.1)

## 2023-11-26 LAB — BPAM PLATELET PHERESIS
Blood Product Expiration Date: 202511022359
ISSUE DATE / TIME: 202511020530
Unit Type and Rh: 5100

## 2023-11-26 LAB — CBC
HCT: 25.5 % — ABNORMAL LOW (ref 39.0–52.0)
HCT: 25.7 % — ABNORMAL LOW (ref 39.0–52.0)
HCT: 27.1 % — ABNORMAL LOW (ref 39.0–52.0)
HCT: 29.5 % — ABNORMAL LOW (ref 39.0–52.0)
Hemoglobin: 7.5 g/dL — ABNORMAL LOW (ref 13.0–17.0)
Hemoglobin: 7.8 g/dL — ABNORMAL LOW (ref 13.0–17.0)
Hemoglobin: 8 g/dL — ABNORMAL LOW (ref 13.0–17.0)
Hemoglobin: 8.6 g/dL — ABNORMAL LOW (ref 13.0–17.0)
MCH: 23.1 pg — ABNORMAL LOW (ref 26.0–34.0)
MCH: 23.6 pg — ABNORMAL LOW (ref 26.0–34.0)
MCH: 23.2 pg — ABNORMAL LOW (ref 26.0–34.0)
MCH: 23.5 pg — ABNORMAL LOW (ref 26.0–34.0)
MCHC: 29.4 g/dL — ABNORMAL LOW (ref 30.0–36.0)
MCHC: 30.4 g/dL (ref 30.0–36.0)
MCHC: 29.5 g/dL — ABNORMAL LOW (ref 30.0–36.0)
MCHC: 29.2 g/dL — ABNORMAL LOW (ref 30.0–36.0)
MCV: 78.5 fL — ABNORMAL LOW (ref 80.0–100.0)
MCV: 77.9 fL — ABNORMAL LOW (ref 80.0–100.0)
MCV: 78.6 fL — ABNORMAL LOW (ref 80.0–100.0)
MCV: 80.6 fL (ref 80.0–100.0)
Platelets: 42 K/uL — ABNORMAL LOW (ref 150–400)
Platelets: 44 K/uL — ABNORMAL LOW (ref 150–400)
Platelets: 45 K/uL — ABNORMAL LOW (ref 150–400)
Platelets: 53 K/uL — ABNORMAL LOW (ref 150–400)
RBC: 3.25 MIL/uL — ABNORMAL LOW (ref 4.22–5.81)
RBC: 3.3 MIL/uL — ABNORMAL LOW (ref 4.22–5.81)
RBC: 3.45 MIL/uL — ABNORMAL LOW (ref 4.22–5.81)
RBC: 3.66 MIL/uL — ABNORMAL LOW (ref 4.22–5.81)
RDW: 22.5 % — ABNORMAL HIGH (ref 11.5–15.5)
RDW: 22.3 % — ABNORMAL HIGH (ref 11.5–15.5)
RDW: 22.2 % — ABNORMAL HIGH (ref 11.5–15.5)
RDW: 22.3 % — ABNORMAL HIGH (ref 11.5–15.5)
WBC: 4.3 K/uL (ref 4.0–10.5)
WBC: 4.1 K/uL (ref 4.0–10.5)
WBC: 4 K/uL (ref 4.0–10.5)
WBC: 5.3 K/uL (ref 4.0–10.5)
nRBC: 0 % (ref 0.0–0.2)
nRBC: 0 % (ref 0.0–0.2)
nRBC: 0 % (ref 0.0–0.2)
nRBC: 0 % (ref 0.0–0.2)

## 2023-11-26 LAB — PREPARE PLATELET PHERESIS: Unit division: 0

## 2023-11-26 LAB — PROTIME-INR
INR: 1.3 — ABNORMAL HIGH (ref 0.8–1.2)
Prothrombin Time: 17.4 s — ABNORMAL HIGH (ref 11.4–15.2)

## 2023-11-26 LAB — GLUCOSE, CAPILLARY: Glucose-Capillary: 105 mg/dL — ABNORMAL HIGH (ref 70–99)

## 2023-11-26 LAB — AMMONIA: Ammonia: 87 umol/L — ABNORMAL HIGH (ref 9–35)

## 2023-11-26 MED ORDER — LACTULOSE 10 GM/15ML PO SOLN
20.0000 g | Freq: Two times a day (BID) | ORAL | Status: DC
  Administered 2023-11-26 – 2023-11-28 (×3): 20 g via ORAL
  Filled 2023-11-26 (×3): qty 30

## 2023-11-26 MED ORDER — CHLORDIAZEPOXIDE HCL 25 MG PO CAPS
25.0000 mg | ORAL_CAPSULE | Freq: Three times a day (TID) | ORAL | Status: AC
Start: 2023-11-26 — End: 2023-11-26
  Administered 2023-11-26 (×3): 25 mg via ORAL
  Filled 2023-11-26 (×3): qty 1

## 2023-11-26 MED ORDER — CHLORDIAZEPOXIDE HCL 25 MG PO CAPS
25.0000 mg | ORAL_CAPSULE | Freq: Two times a day (BID) | ORAL | Status: AC
Start: 2023-11-27 — End: 2023-11-28
  Administered 2023-11-27 (×2): 25 mg via ORAL
  Filled 2023-11-26 (×2): qty 1

## 2023-11-26 MED ORDER — CHLORDIAZEPOXIDE HCL 25 MG PO CAPS: 25.0000 mg | ORAL_CAPSULE | Freq: Every day | ORAL | Status: AC

## 2023-11-26 NOTE — Progress Notes (Addendum)
 [facility] Progress Note  CC:  GI bleed   Subjective: Patient agitated overnight, lasts received Ativan  at 4AM. He is confused at this time. He denies having any N/V. He has mild pain to the RUQ under the ribcage. No CP or SOB. No melena or hematemesis since admission as confirmed by his RN. SBP 160 - 170's. HR 80s.    Objective:  Vital signs in last 24 hours: Temp:  [98 F (36.7 C)-99 F (37.2 C)] 98.1 F (36.7 C) (11/03 0504) Pulse Rate:  [75-96] 82 (11/03 0653) Resp:  [13-20] 20 (11/02 1656) BP: (127-174)/(66-113) 169/92 (11/03 0653) SpO2:  [92 %-100 %] 99 % (11/03 0504) Weight:  [91.9 kg] 91.9 kg (11/02 1255) Last BM Date : 11/25/23 General: Awake 63 year old male in NAD.  Somnolent but arousable. Eyes: No scleral icterus. Heart: RRR, no murmur.  Pulm: Breath sounds clear, decreased in the bases on room air.  Abdomen: Protuberant. Nontender. No ascites. Positive bowel sounds x 4 quadrants. No palpable hepatosplenomegaly. No palpable mass.  Extremities: No lower extremity edema. Bilateral distal shins and feet with purple ecchymosis. Neurologic:  Alert to name, date of birth and home address. He is disoriented to place and date. No asterixis on exam. However, after Asterix exam was completed about 10 seconds later his right hand was notably tremulous. Psych: Not agitated at this time.  Intake/Output from previous day: 11/02 0701 - 11/03 0700 In: 3200 [P.O.:740; I.V.:2360; IV Piggyback:100] Out: 1150 [Urine:1150]  Lab Results: Recent Labs    11/25/23 1545 11/25/23 2151 11/26/23 0358  WBC 3.7* 3.9* 4.3  HGB 7.5* 7.9* 7.5*  HCT 25.2* 26.4* 25.5*  PLT 43* 48* 42*   BMET Recent Labs    11/25/23 0136 11/25/23 0429 11/26/23 0358  NA 136 136 138  K 3.5 3.4* 3.5  CL 101 105 106  CO2 25 22 21*  GLUCOSE 131* 100* 87  BUN 12 12 11   CREATININE 0.85 0.72 0.87  CALCIUM  9.4 8.9 8.7*   LFT Recent Labs    11/26/23 0358  PROT 6.3*  ALBUMIN  3.4*   AST 84*  ALT 30  ALKPHOS 110  BILITOT 2.1*   PT/INR Recent Labs    11/25/23 0136  LABPROT 16.4*  INR 1.2   MELD 3.0: 11 at 11/26/2023  3:58 AM MELD-Na: 11 at 11/26/2023  3:58 AM Calculated from: Serum Creatinine: 0.87 mg/dL (Using min of 1 mg/dL) at 88/06/7972  6:41 AM Serum Sodium: 138 mmol/L (Using max of 137 mmol/L) at 11/26/2023  3:58 AM Total Bilirubin: 2.1 mg/dL at 88/06/7972  6:41 AM Serum Albumin : 3.4 g/dL at 88/06/7972  6:41 AM INR(ratio): 1.2 at 11/25/2023  1:36 AM Age at listing (hypothetical): 77 years Sex: Male at 11/26/2023  3:58 AM   DG Chest Portable 1 View Result Date: 11/25/2023 CLINICAL DATA:  Vomiting with bloody stool. EXAM: PORTABLE CHEST 1 VIEW COMPARISON:  November 09, 2023 FINDINGS: The heart size and mediastinal contours are within normal limits. Mild areas of scarring and/or atelectasis are seen within the bilateral lung bases. No focal consolidation, pleural effusion or pneumothorax is identified. There is a chronic fracture deformity of the mid left clavicle. The visualized skeletal structures are otherwise unremarkable. IMPRESSION: Mild bibasilar scarring and/or atelectasis. Electronically Signed   By: Suzen Dials M.D.   On: 11/25/2023 01:55   Patient Profile: Robert Greene is a 63 y.o. male with past medical history of rheumatoid arthritis, interstitial lung disease, decompensated alcohol associated cirrhosis  complicated by esophageal varices/ascites in the past, alcohol use disorder, colon polyps and colonic AVM. Admitted to the hospital 11/25/2023 with concern for GI bleeding.   Assessment / Plan:  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 MELD 3.0: 11. Admitted with dark stools and suspected hematemesis concerning for UGI  bleed. On Protonix  IV and Octreotide  infusion. On Ceftriaxone . AST 84. ALT 30. Alk phos 110. T. Bili 2.1. Albumin  3.4. Hg 9.0 -> 7.8 ->  8.5 -> 7.5 -> 7.9 -> today Hg 7.5. INR 1.2 on 11/2. SBP 160-170s and HR 80s this morning.  - NPO - Carvedilol  previously held, restarted this morning due to elevated BP and heart rate running in the 80s - EGD scheduled this afternoon with Dr. Avram, may need to cancel EGD if patient is not hemodynamically stable - Continue Pantoprazole  IV twice daily - Continue Octreotide  infusion - Continue Ceftriaxone  1 g IV for infection prophylaxis in setting of patient with cirrhosis and GI bleed - Transfuse for  Hg level < 7 - Carvedilol  previously held, restarted this morning due to elevated BP and heart rate running in the 80s - CBC, CMP in am   Acute on chronic anemia. Hg 9.0 -> 7.8 -> 8.5 -> 7.5 -> 7.9 -> today Hg 7.5 -See plan above   Alcohol use disorder. Last alcohol consumption was Friday 10/31 - CIWA protocol in process  - Continue multivitamin once daily,  Folate 1mg   once daily  and Thiamine  100mg  po once daily   Encephalopathy, possible HE + alcohol withdrawal/sedative effects from Ativan  per CIWA protocol  - Ammonia level  - Nursing staff to monitor neuro status closely  - ? Head CT, defer to the hospitalist   Thrombocytopenia secondary to cirrhosis and alcohol use disorder. No splenomegaly per CTAP without contrast 0/2025. PLT 47 -> 39 -> transfused one unit of platelets 11/2 -> PLT 56 -> 43 -> 48 -> today PLT 42.   History of colon polyps.  Colonoscopy 08/03/2023 identified multiple 1 to 5 mm polyps in the rectosigmoid colon, sigmoid and descending colon left in situ secondary to active GI bleed at that time. - Eventual future flexible sigmoidoscopy as an outpatient for removal of colon polyps  Rheumatoid arthritis on Plaquenil      LOS: 1 day   Elida CHRISTELLA Shawl  11/26/2023, 8:53 AM     [facility] Attending   I have taken an interval history, reviewed the chart and examined the patient.  The majority the medical decision making was performed by me.  I agree with the  Advanced Practitioner's note, impression and recommendations with the following additions:   The patient appears to be going through alcohol withdrawal.  He is on the CLI WA protocol using lorazepam .  I have seen him with nursing staff present.  He is following some simple commands and is less somnolent than previously per staff.  He does not answer questions.  Mumbles.  He does not have asterixis or tremor now though his NH3 level is elevated.  Given withdrawal problems and mental status issues I do not think he is consentable at this time and I do not think endoscopy is urgent so we have postponed that and we will try tomorrow.  He has not had signs of further bleeding, i.e. no melena no hematemesis.  Hemoglobin is stable.  Depending upon clinical course he could need a higher level of care to provide increased nursing care/support.  High  complexity medical decision making in this patient with cirrhosis, alcohol withdrawal, hepatic encephalopathy and GI bleeding with acute blood loss anemia.    [name], MD, [facility] See TRACEY on call - gastroenterology for best contact person 11/26/2023 3:02 PM

## 2023-11-26 NOTE — Progress Notes (Signed)
 PROGRESS NOTE    Robert Greene  FMW:[id number] DOB: [birth year] DOA: 11/25/2023 PCP: [name], MD   Brief Narrative:  Robert Greene is a 63 y.o. male with medical history significant for alcoholism, decompensated cirrhosis, rheumatoid arthritis, interstitial lung disease, and hypertension who presents with rectal bleeding and nausea with vomiting.  Assessment & Plan:   Principal Problem:   Acute GI bleeding Active Problems:   ILD (interstitial lung disease) (HCC)   Essential hypertension   Anemia   Cirrhosis of liver with ascites (HCC)   Rheumatoid arthritis (HCC)   Thrombocytopenia   Hypertensive urgency   Melena   Bleeding esophageal varices (HCC)  Acute GI bleeding; anemia   - Continue IV PPI, ceftriaxone , octreotide  - Follow repeat H&H, transfuse as needed for symptomatic anemia or hemoglobin less than 7 - GI following -appreciate insight and recommendations -known history of esophageal varices -defer to their expertise in regards to intervention versus medical management   Alcohol abuse with concurrent presumed alcohol withdrawal, POA - Patient mental status continues to worsen over the past 24 hours, initiate Librium  taper, continue CIWA protocol - Sitter ordered given patient's confusion as he has not currently redirectable, TeleSitter would not be appropriate neither would restraints due to patient's safety concerns - Once more awake alert and oriented will discuss cessation, patient admitted at intake he wants to quit  Chronic cirrhosis secondary to alcohol use and abuse - MELD score currently 12(15 at intake) - Continues to use alcohol -Continue symptomatic management of withdrawals and GI bleed as above - INR 1.3 (typically mildly elevated in this range)   Thrombocytopenia  - Chronic, in setting of alcoholism and cirrhosis  - Continue to monitor, hold transfusion unless notable bleeding or platelet less than 20   Rheumatoid arthritis; ILD  - Managed  with Enbrel and Plaquenil , appears stable     Hypertensive urgency, resolved - In the setting of above withdrawals, agitation, symptomatic anemia - Continue to monitor, avoid elevated blood pressure in the setting of above and history of esophageal varices  DVT prophylaxis: SCDs Start: 11/25/23 0415 Code Status:   Code Status: Full Code Family Communication: None present  Status is: Inpatient  Dispo: The patient is from: Home              Anticipated d/c is to: To be determined              Anticipated d/c date is: To be determined              Patient currently not medically stable for discharge  Consultants:  GI  Procedures:  Pending above  Antimicrobials:  Ceftriaxone   Subjective: No acute issues or events overnight, review of systems limited by patient's mental status currently.  Objective: Vitals:   11/26/23 0325 11/26/23 0409 11/26/23 0504 11/26/23 0653  BP: (!) 159/107 (!) 155/101 (!) 174/96 (!) 169/92  Pulse: 77 80 89 82  Resp:      Temp:   98.1 F (36.7 C)   TempSrc:   Oral   SpO2: 99%  99%   Weight:      Height:        Intake/Output Summary (Last 24 hours) at 11/26/2023 0744 Last data filed at 11/26/2023 0600 Gross per 24 hour  Intake 3199.95 ml  Output 1150 ml  Net 2049.95 ml   Filed Weights   11/25/23 1255  Weight: 91.9 kg    Examination:  General: Patient somnolent in bed, no acute distress HEENT:  Normocephalic atraumatic.  Sclerae nonicteric, noninjected.  Extraocular movements intact bilaterally. Neck:  Without mass or deformity.  Trachea is midline. Lungs: Diminished bilaterally. Heart:  Regular rate and rhythm.  Without murmurs, rubs, or gallops. Abdomen:  Soft, nontender, mildly distended. Extremities: Without cyanosis, clubbing, edema, or obvious deformity. Skin:  Warm and dry, no erythema.  Data Reviewed: I have personally reviewed following labs and imaging studies  CBC: Recent Labs  Lab 11/25/23 0136 11/25/23 0429  11/25/23 0838 11/25/23 1545 11/25/23 2151 11/26/23 0358  WBC 4.9 4.7 6.2 3.7* 3.9* 4.3  NEUTROABS 3.3  --   --   --   --   --   HGB 9.0* 7.8* 8.5* 7.5* 7.9* 7.5*  HCT 29.5* 25.2* 28.1* 25.2* 26.4* 25.5*  MCV 76.4* 75.7* 77.0* 78.0* 77.2* 78.5*  PLT 47* 39* 56* 43* 48* 42*   Basic Metabolic Panel: Recent Labs  Lab 11/25/23 0136 11/25/23 0429 11/26/23 0358  NA 136 136 138  K 3.5 3.4* 3.5  CL 101 105 106  CO2 25 22 21*  GLUCOSE 131* 100* 87  BUN 12 12 11   CREATININE 0.85 0.72 0.87  CALCIUM  9.4 8.9 8.7*  MG  --  1.8  --    GFR: Estimated Creatinine Clearance: 97.4 mL/min (by C-G formula based on SCr of 0.87 mg/dL). Liver Function Tests: Recent Labs  Lab 11/25/23 0136 11/25/23 0429 11/26/23 0358  AST 76* 64* 84*  ALT 33 27 30  ALKPHOS 128* 111 110  BILITOT 2.7* 2.3* 2.1*  PROT 7.2 6.3* 6.3*  ALBUMIN  3.7 3.3* 3.4*   Recent Labs  Lab 11/25/23 0136  LIPASE 55*   Coagulation Profile: Recent Labs  Lab 11/25/23 0136  INR 1.2   CBG: Recent Labs  Lab 11/26/23 0714  GLUCAP 105*   No results found for this or any previous visit (from the past 240 hours).   Radiology Studies: DG Chest Portable 1 View Result Date: 11/25/2023 CLINICAL DATA:  Vomiting with bloody stool. EXAM: PORTABLE CHEST 1 VIEW COMPARISON:  November 09, 2023 FINDINGS: The heart size and mediastinal contours are within normal limits. Mild areas of scarring and/or atelectasis are seen within the bilateral lung bases. No focal consolidation, pleural effusion or pneumothorax is identified. There is a chronic fracture deformity of the mid left clavicle. The visualized skeletal structures are otherwise unremarkable. IMPRESSION: Mild bibasilar scarring and/or atelectasis. Electronically Signed   By: Suzen Dials M.D.   On: 11/25/2023 01:55    Scheduled Meds:  sodium chloride    Intravenous Once   carvedilol   3.125 mg Oral BID WC   folic acid   1 mg Oral Daily   hydroxychloroquine   200 mg Oral Daily    LORazepam   0-4 mg Intravenous Q4H   Followed by   NOREEN ON 11/27/2023] LORazepam   0-4 mg Intravenous Q8H   losartan   100 mg Oral Daily   multivitamin with minerals  1 tablet Oral Daily   pantoprazole  (PROTONIX ) IV  40 mg Intravenous Q12H   sodium chloride  flush  3 mL Intravenous Q12H   thiamine   100 mg Oral Daily   Or   thiamine   100 mg Intravenous Daily   Continuous Infusions:  cefTRIAXone  (ROCEPHIN )  IV 1 g (11/26/23 0056)   octreotide  (SANDOSTATIN ) 500 mcg in sodium chloride  0.9 % 250 mL (2 mcg/mL) infusion 50 mcg/hr (11/26/23 0149)     LOS: 1 day   Time spent:  Elsie JAYSON Montclair, DO [facility]  If 7PM-7AM, please contact night-coverage [email or web address]  11/26/2023, 7:44 AM

## 2023-11-26 NOTE — Progress Notes (Signed)
       Overnight   NAME: Robert Greene MRN: [id number] DOB : 1960-05-30    Date of Service   11/26/2023   HPI/Events of Note    Notified by RN for failed retrial of Telesitter.  Physical sitter is available. Rescore CIWA   Interventions/ Plan   Reordered 1:1 sitter  Continue vigilant CIWA reassessment and dosing       Lynwood Kipper BSN MSNA MSN ACNPC-AG Acute Care Nurse Practitioner [facility]

## 2023-11-26 NOTE — Anesthesia Preprocedure Evaluation (Signed)
 Anesthesia Evaluation  Patient identified by MRN, date of birth, ID band Patient confused    Reviewed: Allergy & Precautions, NPO status , Patient's Chart, lab work & pertinent test results, reviewed documented beta blocker date and time   Airway Mallampati: III       Dental  (+) Teeth Intact, Dental Advisory Given   Pulmonary shortness of breath, Current Smoker and Patient abstained from smoking.   breath sounds clear to auscultation       Cardiovascular hypertension, + CAD   Rhythm:Regular Rate:Normal     Neuro/Psych  PSYCHIATRIC DISORDERS Anxiety     encephalopathy    GI/Hepatic ,,,(+) Cirrhosis  (decompensated)  Esophageal Varices  substance abuse  alcohol use  Endo/Other    Renal/GU Renal disease     Musculoskeletal  (+) Arthritis ,    Abdominal   Peds  Hematology  (+) Blood dyscrasia, anemia   Anesthesia Other Findings   Reproductive/Obstetrics                              Anesthesia Physical Anesthesia Plan  ASA: 4  Anesthesia Plan: General   Post-op Pain Management:    Induction: Intravenous and Rapid sequence  PONV Risk Score and Plan: 2  Airway Management Planned: Oral ETT  Additional Equipment:   Intra-op Plan:   Post-operative Plan: Extubation in OR  Informed Consent: I have reviewed the patients History and Physical, chart, labs and discussed the procedure including the risks, benefits and alternatives for the proposed anesthesia with the patient or authorized representative who has indicated his/her understanding and acceptance.     Dental advisory given and Consent reviewed with POA  Plan Discussed with: CRNA and Surgeon  Anesthesia Plan Comments: (Consent obtained from patient's son, [name], and daughter, [name], via phone. Somnolent on exam and oriented to name, birthday, situation. )         Anesthesia Quick Evaluation

## 2023-11-27 DIAGNOSIS — K7031 Alcoholic cirrhosis of liver with ascites: Secondary | ICD-10-CM | POA: Diagnosis not present

## 2023-11-27 DIAGNOSIS — K921 Melena: Secondary | ICD-10-CM | POA: Diagnosis not present

## 2023-11-27 DIAGNOSIS — D62 Acute posthemorrhagic anemia: Secondary | ICD-10-CM | POA: Diagnosis not present

## 2023-11-27 DIAGNOSIS — K7682 Hepatic encephalopathy: Secondary | ICD-10-CM | POA: Diagnosis not present

## 2023-11-27 LAB — CBC
HCT: 29.1 % — ABNORMAL LOW (ref 39.0–52.0)
Hemoglobin: 8.6 g/dL — ABNORMAL LOW (ref 13.0–17.0)
MCH: 23.4 pg — ABNORMAL LOW (ref 26.0–34.0)
MCHC: 29.6 g/dL — ABNORMAL LOW (ref 30.0–36.0)
MCV: 79.1 fL — ABNORMAL LOW (ref 80.0–100.0)
Platelets: 55 K/uL — ABNORMAL LOW (ref 150–400)
RBC: 3.68 MIL/uL — ABNORMAL LOW (ref 4.22–5.81)
RDW: 22.4 % — ABNORMAL HIGH (ref 11.5–15.5)
WBC: 5.6 K/uL (ref 4.0–10.5)
nRBC: 0 % (ref 0.0–0.2)

## 2023-11-27 LAB — BILIRUBIN, DIRECT: Bilirubin, Direct: 1.3 mg/dL — ABNORMAL HIGH (ref 0.0–0.2)

## 2023-11-27 LAB — COMPREHENSIVE METABOLIC PANEL WITH GFR
ALT: 36 U/L (ref 0–44)
AST: 84 U/L — ABNORMAL HIGH (ref 15–41)
Albumin: 3.6 g/dL (ref 3.5–5.0)
Alkaline Phosphatase: 121 U/L (ref 38–126)
Anion gap: 11 (ref 5–15)
BUN: 12 mg/dL (ref 8–23)
CO2: 21 mmol/L — ABNORMAL LOW (ref 22–32)
Calcium: 9.2 mg/dL (ref 8.9–10.3)
Chloride: 108 mmol/L (ref 98–111)
Creatinine, Ser: 0.95 mg/dL (ref 0.61–1.24)
GFR, Estimated: >60 mL/min (ref >60)
Glucose, Bld: 106 mg/dL — ABNORMAL HIGH (ref 70–99)
Potassium: 3.5 mmol/L (ref 3.5–5.1)
Sodium: 140 mmol/L (ref 135–145)
Total Bilirubin: 2.5 mg/dL — ABNORMAL HIGH (ref 0.0–1.2)
Total Protein: 6.8 g/dL (ref 6.5–8.1)

## 2023-11-27 LAB — AMMONIA: Ammonia: 85 umol/L — ABNORMAL HIGH (ref 9–35)

## 2023-11-27 MED ORDER — LACTATED RINGERS IV SOLN: INTRAVENOUS | Status: DC

## 2023-11-27 MED ORDER — BOOST / RESOURCE BREEZE PO LIQD CUSTOM
1.0000 | Freq: Three times a day (TID) | ORAL | Status: DC
  Administered 2023-11-27 – 2023-12-03 (×13): 1 via ORAL

## 2023-11-27 NOTE — Progress Notes (Addendum)
 PROGRESS NOTE    Robert Greene  FMW:[id number] DOB: November 09, 1960 DOA: 11/25/2023 PCP: [name], MD   Brief Narrative:  Robert Greene is a 63 y.o. male with medical history significant for alcoholism, decompensated cirrhosis, rheumatoid arthritis, interstitial lung disease, and hypertension who presents with rectal bleeding and nausea with vomiting.  Assessment & Plan:   Principal Problem:   Acute GI bleeding Active Problems:   ILD (interstitial lung disease) (HCC)   Essential hypertension   Anemia   Cirrhosis of liver with ascites (HCC)   Rheumatoid arthritis (HCC)   Thrombocytopenia   Hypertensive urgency   Melena   Bleeding esophageal varices (HCC)   Encephalopathy, hepatic (HCC)   Acute blood loss anemia  Chronic alcohol abuse  Currently in alcohol withdrawals, POA - Mental status continues to wax and wane, minimally improved from previous but remains somnolent, easy to arouse but unable to orient other than to person - Continue CIWA protocol, Librium  taper ongoing - Currently unable to consent to procedure as below, continue to follow clinically - Cessation discussed at intake, would recommend follow-up at discharge with supportive care  Presumed acute GI bleeding; anemia   - Continue IV PPI, ceftriaxone , octreotide  - GI following, appreciate insight recommendations, unfortunately unable to consent at this time given mental status as above, will await for patient to stabilize prior to any further procedure unless considered emergent -at this time he remains quite stable - Follow repeat H&H, transfuse as appropriate for symptomatic anemia or hemoglobin less than 7 - Baseline hemoglobin around 9  Chronic cirrhosis secondary to alcohol use and abuse - MELD score currently 12(15 at intake) - Continues to use alcohol -Continue symptomatic management of withdrawals and GI bleed as above - INR 1.3 (typically mildly elevated in this range)   Thrombocytopenia  -  Chronic, in setting of alcoholism and cirrhosis  - Continue to monitor, hold transfusion unless notable bleeding or platelet less than 20   Rheumatoid arthritis; ILD  - Managed with Enbrel and Plaquenil , appears stable     Hypertensive urgency, resolved - In the setting of above withdrawals, agitation, symptomatic anemia - Continue to monitor, avoid elevated blood pressure in the setting of above and history of esophageal varices  DVT prophylaxis: SCDs Start: 11/25/23 0415 Code Status:   Code Status: Full Code Family Communication: None present  Status is: Inpatient  Dispo: The patient is from: Home              Anticipated d/c is to: To be determined              Anticipated d/c date is: To be determined              Patient currently not medically stable for discharge  Consultants:  GI  Procedures:  Endoscopy pending above  Antimicrobials:  Ceftriaxone   Subjective: Worsening mental status and behavior overnight requiring safety sitter at bedside, review of systems limited by patient's mental status currently -denies nausea vomiting headache fevers chills or chest pain  Objective: Vitals:   11/27/23 0242 11/27/23 0303 11/27/23 0446 11/27/23 0513  BP: (!) 167/97 139/83 133/78 136/84  Pulse: 89 85 88 82  Resp: 17  17   Temp:   98.4 F (36.9 C)   TempSrc:   Oral   SpO2:   98%   Weight:      Height:        Intake/Output Summary (Last 24 hours) at 11/27/2023 0814 Last data filed at 11/27/2023 0700  Gross per 24 hour  Intake 782.46 ml  Output 1700 ml  Net -917.54 ml   Filed Weights   11/25/23 1255  Weight: 91.9 kg    Examination:  General:  Pleasantly resting in bed, No acute distress. Oriented to person only - somnolent but arousable HEENT:  Normocephalic atraumatic.  Sclerae nonicteric, noninjected.  Extraocular movements intact bilaterally. Neck:  Without mass or deformity.  Trachea is midline. Lungs:  Clear to auscultate bilaterally without rhonchi, wheeze,  or rales. Heart:  Regular rate and rhythm.  Without murmurs, rubs, or gallops. Abdomen:  Soft, nontender, nondistended.  Without guarding or rebound. Extremities: Without cyanosis, clubbing, edema, or obvious deformity. Skin:  Warm and dry, no erythema.  Data Reviewed: I have personally reviewed following labs and imaging studies  CBC: Recent Labs  Lab 11/25/23 0136 11/25/23 0429 11/26/23 0358 11/26/23 1104 11/26/23 1705 11/26/23 2206 11/27/23 0424  WBC 4.9   < > 4.3 4.1 4.0 5.3 5.6  NEUTROABS 3.3  --   --   --   --   --   --   HGB 9.0*   < > 7.5* 7.8* 8.0* 8.6* 8.6*  HCT 29.5*   < > 25.5* 25.7* 27.1* 29.5* 29.1*  MCV 76.4*   < > 78.5* 77.9* 78.6* 80.6 79.1*  PLT 47*   < > 42* 44* 45* 53* 55*   < > = values in this interval not displayed.   Basic Metabolic Panel: Recent Labs  Lab 11/25/23 0136 11/25/23 0429 11/26/23 0358 11/27/23 0424  NA 136 136 138 140  K 3.5 3.4* 3.5 3.5  CL 101 105 106 108  CO2 25 22 21* 21*  GLUCOSE 131* 100* 87 106*  BUN 12 12 11 12   CREATININE 0.85 0.72 0.87 0.95  CALCIUM  9.4 8.9 8.7* 9.2  MG  --  1.8  --   --    GFR: Estimated Creatinine Clearance: 89.2 mL/min (by C-G formula based on SCr of 0.95 mg/dL). Liver Function Tests: Recent Labs  Lab 11/25/23 0136 11/25/23 0429 11/26/23 0358 11/27/23 0424  AST 76* 64* 84* 84*  ALT 33 27 30 36  ALKPHOS 128* 111 110 121  BILITOT 2.7* 2.3* 2.1* 2.5*  PROT 7.2 6.3* 6.3* 6.8  ALBUMIN  3.7 3.3* 3.4* 3.6   Recent Labs  Lab 11/25/23 0136  LIPASE 55*   Coagulation Profile: Recent Labs  Lab 11/25/23 0136 11/26/23 1104  INR 1.2 1.3*   CBG: Recent Labs  Lab 11/26/23 0714  GLUCAP 105*   No results found for this or any previous visit (from the past 240 hours).   Radiology Studies: No results found.   Scheduled Meds:  sodium chloride    Intravenous Once   carvedilol   3.125 mg Oral BID WC   chlordiazePOXIDE   25 mg Oral BID   Followed by   NOREEN ON 11/28/2023] chlordiazePOXIDE   25  mg Oral Q1500   folic acid   1 mg Oral Daily   hydroxychloroquine   200 mg Oral Daily   lactulose   20 g Oral BID   LORazepam   0-4 mg Intravenous Q8H   losartan   100 mg Oral Daily   multivitamin with minerals  1 tablet Oral Daily   pantoprazole  (PROTONIX ) IV  40 mg Intravenous Q12H   sodium chloride  flush  3 mL Intravenous Q12H   thiamine   100 mg Oral Daily   Or   thiamine   100 mg Intravenous Daily   Continuous Infusions:  cefTRIAXone  (ROCEPHIN )  IV 1 g (11/27/23 0113)  octreotide  (SANDOSTATIN ) 500 mcg in sodium chloride  0.9 % 250 mL (2 mcg/mL) infusion 50 mcg/hr (11/26/23 2301)     LOS: 2 days   Time spent:  Elsie JAYSON Montclair, DO [facility]  If 7PM-7AM, please contact night-coverage [email or web address]  11/27/2023, 8:14 AM

## 2023-11-27 NOTE — Progress Notes (Addendum)
 [facility] Progress Note  CC: Robert bleed    Subjective: He is lethargic but arousable. When asked if he was hurting anywhere, he stated my chest hurts then fell back asleep. He nurse tech/sitter stated patient passed 8 loose stools last night, was told a few BM were a little bloody. He passed 2 brown to yellow loose stools this morning without any obvious blood. No black stools.  He continues to have agitation concerning for alcohol withdrawal last night last. Last received Librium  25 mg p.o. at 9:56 AM.  Last received Lorazepam  IV at 4:56 AM.   Objective:  Vital signs in last 24 hours: Temp:  [97.9 F (36.6 C)-98.4 F (36.9 C)] 98.3 F (36.8 C) (11/04 0956) Pulse Rate:  [69-89] 87 (11/04 0956) Resp:  [16-18] 16 (11/04 0956) BP: (133-167)/(78-102) 153/93 (11/04 0956) SpO2:  [98 %-100 %] 100 % (11/04 0956) Last BM Date : 11/27/23 General: Chronically ill appearing somnolent 63 year old male arousable in no acute distress. Eyes: No scleral icterus.  Heart: Regular rate and rhythm, no murmurs. Pulm: Breath sounds clear on room air. Abdomen: Protuberant. Nontender. No ascites. Positive bowel sounds x 4 quadrants. No palpable hepatosplenomegaly. No palpable mass.  Extremities: No lower extremity edema. Neurologic: Patient is somnolent but arousable.  He was able to state his name and home address.  Unable to perform asterixis exam at this time due to lethargy and inability to follow instructions. Psych: Not agitated at this time.  Intake/Output from previous day: 11/03 0701 - 11/04 0700 In: 862.4 [P.O.:430; I.V.:432.4] Out: 1700 [Urine:1700]  Lab Results: Recent Labs    11/26/23 1705 11/26/23 2206 11/27/23 0424  WBC 4.0 5.3 5.6  HGB 8.0* 8.6* 8.6*  HCT 27.1* 29.5* 29.1*  PLT 45* 53* 55*   BMET Recent Labs    11/25/23 0429 11/26/23 0358 11/27/23 0424  NA 136 138 140  K 3.4* 3.5 3.5  CL 105 106 108  CO2 22 21* 21*  GLUCOSE 100* 87 106*  BUN 12 11  12   CREATININE 0.72 0.87 0.95  CALCIUM  8.9 8.7* 9.2   LFT Recent Labs    11/27/23 0424  PROT 6.8  ALBUMIN  3.6  AST 84*  ALT 36  ALKPHOS 121  BILITOT 2.5*  BILIDIR 1.3*   PT/INR Recent Labs    11/25/23 0136 11/26/23 1104  LABPROT 16.4* 17.4*  INR 1.2 1.3*   Patient Profile: Robert [facility] is a 63 y.o. male with past medical history of rheumatoid arthritis, interstitial lung disease, decompensated alcohol associated cirrhosis complicated by esophageal varices/ascites in the past, alcohol use disorder, colon polyps and colonic AVM. Admitted to the hospital 11/25/2023 with concern for Robert bleeding.   Assessment / Plan:  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 MELD 3.0: 11. Admitted with dark stools and suspected hematemesis concerning for UGI  bleed. On Protonix  IV and Octreotide  infusion. On Ceftriaxone . AST 84. ALT 36. Alk phos 121. T. Bili 2.1 -> 2.5. Direct bili 1.3. Hg 8.6.  Med tech reported patient passed a few bloody stools last night. Patient passed 2 brown yellow nonbloody stools this morning. No black stools.  - Clear liquid diet when awake enough to safely consume  - NPO after midnight  - Patient somnolent today, receiving Librium  and lorazepam  per CIWA protocol for alcohol withdrawal therefore EGD canceled today and rescheduled tomorrow - Continue Carvedilol  bid - Continue Pantoprazole  IV twice daily - Continue Octreotide  infusion -  Continue Ceftriaxone  1 g IV for infection prophylaxis in setting of patient with cirrhosis and Robert bleed - Transfuse for  Hg level < 7 - CBC, CMP in am  - Nursing staff to document BM color and frequency,  document any bloody or black stools    Acute on chronic anemia. Hg 9.0 -> 7.8 -> 8.5 -> 7.5 -> 7.9 -> Hg 7.5 -> today Hg 8.6. -See plan above  -Continue to monitor patient for active Robert bleeding   Alcohol use disorder. Last alcohol  consumption was Friday 10/31. Continued agitation/withdrawal symptoms overnight. Received Librium  25 mg p.o. at 9:56 AM.  Last received Lorazepam  IV at 4:56 AM.  - CIWA protocol in process  - Continue multivitamin once daily, Folate 1mg   once daily  and Thiamine  100mg  po once daily    Encephalopathy, possible HE + alcohol withdrawal/sedative effects from Ativan  per CIWA protocol. Ammonia 87 -> 85.  Patient passed 8 loose stools last night, a few were bloody as noted per the nursing tech.  Patient passed 2 nonbloody brown-yellowish loose stools thus far today.  - Nursing staff to monitor neuro status closely  - ? Head CT, defer to the hospitalist  - Reduce oral Lactulose  20 g p.o. daily, if unable to take p.o. will require Lactulose  enema   Thrombocytopenia secondary to cirrhosis and alcohol use disorder. No splenomegaly per CTAP without contrast 0/2025. PLT 47 -> 39 -> transfused one unit of platelets 11/2 -> PLT 56 -> 43 -> 48 -> 42 -> 55.  Patient stated having chest pain then drifted back to sleep -Dr. Lue and RN notified. Defer management per Dr. Lue   History of colon polyps.  Colonoscopy 08/03/2023 identified multiple 1 to 5 mm polyps in the rectosigmoid colon, sigmoid and descending colon left in situ secondary to active Robert bleed at that time. - Eventual future flexible sigmoidoscopy as an outpatient for removal of colon polyps   Rheumatoid arthritis on Plaquenil       LOS: 2 days   Robert [facility]  11/27/2023, 11:19 AM    [facility] Greene [facility] Greene   I have taken an interval history, reviewed the chart and examined the patient.  Majority the medical decision making was performed by me.  I agree with the Advanced Practitioner's note, impression and recommendations with the following additions:  Robert bleeding, possible melena and hematemesis in an alcoholic cirrhotic patient. Hepatic encephalopathy and alcohol withdrawal Severe thrombocytopenia   He is more alert  this afternoon.  I could not detect asterixis.  He said he was feeling okay with just some pain in his hands and his legs.  There is no evidence of persistent bleeding.  We are hoping for EGD tomorrow depending upon mental status.  Robert Robert Commander, MD, [facility]  Gastroenterology See TRACEY on call - gastroenterology for best contact person 11/27/2023 3:02 PM

## 2023-11-28 ENCOUNTER — Inpatient Hospital Stay (HOSPITAL_COMMUNITY): Payer: Self-pay

## 2023-11-28 ENCOUNTER — Encounter (HOSPITAL_COMMUNITY)
Admission: EM | Disposition: A | Discharge status: Home Health | Payer: Self-pay | Source: Home / Self Care | Attending: Internal Medicine

## 2023-11-28 DIAGNOSIS — K766 Portal hypertension: Principal | ICD-10-CM

## 2023-11-28 DIAGNOSIS — I8500 Esophageal varices without bleeding: Secondary | ICD-10-CM | POA: Diagnosis not present

## 2023-11-28 DIAGNOSIS — K3189 Other diseases of stomach and duodenum: Secondary | ICD-10-CM

## 2023-11-28 DIAGNOSIS — F17210 Nicotine dependence, cigarettes, uncomplicated: Secondary | ICD-10-CM

## 2023-11-28 DIAGNOSIS — I2510 Atherosclerotic heart disease of native coronary artery without angina pectoris: Secondary | ICD-10-CM | POA: Diagnosis not present

## 2023-11-28 DIAGNOSIS — K7031 Alcoholic cirrhosis of liver with ascites: Secondary | ICD-10-CM | POA: Diagnosis not present

## 2023-11-28 DIAGNOSIS — K922 Gastrointestinal hemorrhage, unspecified: Secondary | ICD-10-CM | POA: Diagnosis not present

## 2023-11-28 DIAGNOSIS — I10 Essential (primary) hypertension: Secondary | ICD-10-CM | POA: Diagnosis not present

## 2023-11-28 DIAGNOSIS — K319 Disease of stomach and duodenum, unspecified: Secondary | ICD-10-CM | POA: Diagnosis not present

## 2023-11-28 DIAGNOSIS — K920 Hematemesis: Secondary | ICD-10-CM

## 2023-11-28 DIAGNOSIS — D62 Acute posthemorrhagic anemia: Secondary | ICD-10-CM | POA: Diagnosis not present

## 2023-11-28 DIAGNOSIS — K921 Melena: Secondary | ICD-10-CM | POA: Diagnosis not present

## 2023-11-28 HISTORY — PX: ESOPHAGOGASTRODUODENOSCOPY: SHX5428

## 2023-11-28 LAB — COMPREHENSIVE METABOLIC PANEL WITH GFR
ALT: 30 U/L (ref 0–44)
AST: 60 U/L — ABNORMAL HIGH (ref 15–41)
Albumin: 3.3 g/dL — ABNORMAL LOW (ref 3.5–5.0)
Alkaline Phosphatase: 110 U/L (ref 38–126)
Anion gap: 9 (ref 5–15)
BUN: 14 mg/dL (ref 8–23)
CO2: 21 mmol/L — ABNORMAL LOW (ref 22–32)
Calcium: 8.5 mg/dL — ABNORMAL LOW (ref 8.9–10.3)
Chloride: 110 mmol/L (ref 98–111)
Creatinine, Ser: 0.96 mg/dL (ref 0.61–1.24)
GFR, Estimated: >60 mL/min (ref >60)
Glucose, Bld: 98 mg/dL (ref 70–99)
Potassium: 3.2 mmol/L — ABNORMAL LOW (ref 3.5–5.1)
Sodium: 140 mmol/L (ref 135–145)
Total Bilirubin: 2.3 mg/dL — ABNORMAL HIGH (ref 0.0–1.2)
Total Protein: 6.3 g/dL — ABNORMAL LOW (ref 6.5–8.1)

## 2023-11-28 LAB — MAGNESIUM: Magnesium: 2 mg/dL (ref 1.7–2.4)

## 2023-11-28 LAB — CBC
HCT: 29.2 % — ABNORMAL LOW (ref 39.0–52.0)
Hemoglobin: 8.5 g/dL — ABNORMAL LOW (ref 13.0–17.0)
MCH: 23.2 pg — ABNORMAL LOW (ref 26.0–34.0)
MCHC: 29.1 g/dL — ABNORMAL LOW (ref 30.0–36.0)
MCV: 79.8 fL — ABNORMAL LOW (ref 80.0–100.0)
Platelets: 60 K/uL — ABNORMAL LOW (ref 150–400)
RBC: 3.66 MIL/uL — ABNORMAL LOW (ref 4.22–5.81)
RDW: 22.7 % — ABNORMAL HIGH (ref 11.5–15.5)
WBC: 6.5 K/uL (ref 4.0–10.5)
nRBC: 0 % (ref 0.0–0.2)

## 2023-11-28 LAB — PROTIME-INR
INR: 1.4 — ABNORMAL HIGH (ref 0.8–1.2)
Prothrombin Time: 17.8 s — ABNORMAL HIGH (ref 11.4–15.2)

## 2023-11-28 LAB — AMMONIA: Ammonia: 91 umol/L — ABNORMAL HIGH (ref 9–35)

## 2023-11-28 SURGERY — EGD (ESOPHAGOGASTRODUODENOSCOPY)
Anesthesia: General

## 2023-11-28 MED ORDER — PHENYLEPHRINE HCL-NACL 20-0.9 MG/250ML-% IV SOLN
INTRAVENOUS | Status: DC | PRN
  Administered 2023-11-28: 40 ug via INTRAVENOUS

## 2023-11-28 MED ORDER — DEXAMETHASONE SOD PHOSPHATE PF 10 MG/ML IJ SOLN
INTRAMUSCULAR | Status: DC | PRN
  Administered 2023-11-28: 5 mg via INTRAVENOUS

## 2023-11-28 MED ORDER — PANTOPRAZOLE SODIUM 40 MG PO TBEC
40.0000 mg | DELAYED_RELEASE_TABLET | Freq: Every day | ORAL | Status: DC
  Administered 2023-11-29 – 2023-12-03 (×5): 40 mg via ORAL
  Filled 2023-11-28 (×5): qty 1

## 2023-11-28 MED ORDER — LACTATED RINGERS IV SOLN: INTRAVENOUS | Status: AC

## 2023-11-28 MED ORDER — SODIUM CHLORIDE 0.9 % IV SOLN
INTRAVENOUS | Status: AC | PRN
  Administered 2023-11-28: 500 mL via INTRAVENOUS

## 2023-11-28 MED ORDER — ONDANSETRON HCL 4 MG/2ML IJ SOLN
INTRAMUSCULAR | Status: DC | PRN
  Administered 2023-11-28: 4 mg via INTRAVENOUS

## 2023-11-28 MED ORDER — SUCCINYLCHOLINE CHLORIDE 200 MG/10ML IV SOSY
PREFILLED_SYRINGE | INTRAVENOUS | Status: DC | PRN
Start: 2023-11-28 — End: 2023-11-28
  Administered 2023-11-28: 100 mg via INTRAVENOUS

## 2023-11-28 MED ORDER — PROPOFOL 10 MG/ML IV BOLUS
INTRAVENOUS | Status: DC | PRN
Start: 2023-11-28 — End: 2023-11-28
  Administered 2023-11-28: 50 mg via INTRAVENOUS
  Administered 2023-11-28: 100 mg via INTRAVENOUS
  Administered 2023-11-28: 20 mg via INTRAVENOUS

## 2023-11-28 MED ORDER — LIDOCAINE HCL (PF) 2 % IJ SOLN
INTRAMUSCULAR | Status: DC | PRN
  Administered 2023-11-28: 100 mg via INTRADERMAL

## 2023-11-28 MED ORDER — LACTULOSE 10 GM/15ML PO SOLN
20.0000 g | Freq: Three times a day (TID) | ORAL | Status: DC
  Administered 2023-11-28 – 2023-12-01 (×9): 20 g via ORAL
  Filled 2023-11-28 (×9): qty 30

## 2023-11-28 MED ORDER — LORAZEPAM 1 MG PO TABS
1.0000 mg | ORAL_TABLET | ORAL | Status: AC | PRN
  Administered 2023-11-29: 1 mg via ORAL
  Filled 2023-11-28: qty 1

## 2023-11-28 MED ORDER — CARMEX CLASSIC LIP BALM EX OINT
TOPICAL_OINTMENT | CUTANEOUS | Status: DC | PRN
  Filled 2023-11-28: qty 10

## 2023-11-28 MED ORDER — LORAZEPAM 2 MG/ML IJ SOLN
1.0000 mg | INTRAMUSCULAR | Status: AC | PRN
  Administered 2023-11-29 (×2): 2 mg via INTRAVENOUS
  Filled 2023-11-28 (×2): qty 1

## 2023-11-28 MED ORDER — POTASSIUM CHLORIDE CRYS ER 10 MEQ PO TBCR
40.0000 meq | EXTENDED_RELEASE_TABLET | Freq: Once | ORAL | Status: AC
  Administered 2023-11-28: 40 meq via ORAL
  Filled 2023-11-28: qty 4

## 2023-11-28 NOTE — Transfer of Care (Signed)
 Immediate Anesthesia Transfer of Care Note  [name]  Procedure(s) Performed: EGD (ESOPHAGOGASTRODUODENOSCOPY)  Patient Location: PACU  Anesthesia Type:General  Level of Consciousness: awake, alert , oriented, and patient cooperative  Airway & Oxygen Therapy: Patient Spontanous Breathing and Patient connected to face mask oxygen  Post-op Assessment: Report given to RN and Post -op Vital signs reviewed and stable  Post vital signs: Reviewed and stable  Last Vitals:  Vitals Value Taken Time  BP    Temp    Pulse 69 11/28/23 14:23  Resp 19 11/28/23 14:23  SpO2 99 % 11/28/23 14:23  Vitals shown include unfiled device data.  Last Pain:  Vitals:   11/28/23 1252  TempSrc: Temporal  PainSc: 1       Patients Stated Pain Goal: 0 (11/28/23 1252)  Complications: No notable events documented.

## 2023-11-28 NOTE — Progress Notes (Addendum)
 [facility] Progress Note  CC:  GI bleed    Subjective: Patient is less somnolent this morning.  Nonagitated at this time.  I turned on his lights and elevated the head of the bed to a sitting position.  He denies having any nausea or vomiting.  No abdominal pain.  No bowel movements overnight or thus far this morning as verified by his RN.    Objective:  Vital signs in last 24 hours: Temp:  [97.8 F (36.6 C)-98.5 F (36.9 C)] 97.8 F (36.6 C) (11/05 0501) Pulse Rate:  [66-87] 67 (11/05 0501) Resp:  [14-16] 16 (11/04 2045) BP: (115-155)/(68-93) 118/72 (11/05 0501) SpO2:  [92 %-100 %] 97 % (11/05 0501) Last BM Date : 11/27/23 General: 63 year old male somnolent but more easily arousable today.  No acute distress. Eyes: Mild scleral icterus. Heart: Regular rate and rhythm, no murmurs. Pulm: Breath sounds clear, decreased in the bases.  On room air. Abdomen:  Protuberant. Nontender. No ascites. Positive bowel sounds x 4 quadrants. No palpable hepatosplenomegaly. No palpable mass.  Extremities: No lower extremity edema. Neurologic: Somnolent but arousable.  Alert to name, place but not to time.  He stated he was in the hospital due to internal bleeding.  No asterixis. Psych: Not agitated.  Intake/Output from previous day: 11/04 0701 - 11/05 0700 In: 1488 [P.O.:360; I.V.:1028; IV Piggyback:100] Out: 600 [Urine:600] Intake/Output this shift: No intake/output data recorded.  Lab Results: Recent Labs    11/26/23 2206 11/27/23 0424 11/28/23 0429  WBC 5.3 5.6 6.5  HGB 8.6* 8.6* 8.5*  HCT 29.5* 29.1* 29.2*  PLT 53* 55* 60*   BMET Recent Labs    11/26/23 0358 11/27/23 0424 11/28/23 0429  NA 138 140 140  K 3.5 3.5 3.2*  CL 106 108 110  CO2 21* 21* 21*  GLUCOSE 87 106* 98  BUN 11 12 14   CREATININE 0.87 0.95 0.96  CALCIUM  8.7* 9.2 8.5*   LFT Recent Labs    11/27/23 0424 11/28/23 0429  PROT 6.8 6.3*  ALBUMIN  3.6 3.3*  AST 84* 60*  ALT 36 30   ALKPHOS 121 110  BILITOT 2.5* 2.3*  BILIDIR 1.3*  --    PT/INR Recent Labs    11/26/23 1104 11/28/23 0429  LABPROT 17.4* 17.8*  INR 1.3* 1.4*   Hepatitis Panel No results for input(s): HEPBSAG, HCVAB, HEPAIGM, HEPBIGM in the last 72 hours.  No results found.  Patient Profile: Robert [facility] is a 63 y.o. male with past medical history of rheumatoid arthritis, interstitial lung disease, decompensated alcohol associated cirrhosis complicated by esophageal varices/ascites in the past, alcohol use disorder, colon polyps and colonic AVM. Admitted to the hospital 11/25/2023 with concern for GI bleeding.   Assessment / Plan:  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 MELD 3.0: 11. Admitted with dark stools and suspected hematemesis concerning for UGI  bleed. On Protonix  IV and Octreotide  infusion. On Ceftriaxone . AST 84 -> 60. ALT 30. Alk phos 110. T. Bili 2.1 -> 2.5 -> 2.3. Direct bili 1.3. Hg 8.6 -> 8.5. EGD cancelled 11/3 and 11/4 due to somnolence/alcohol withdrawal on Lorazepam  and Librium .  EGD rescheduled today. - NPO  - Hold next scheduled dose of Lorazepam  and Librax if no active signs of agitation/alcohol withdrawal, discussed with RN - EGD today, benefits and risks discussed including risk with sedation, risk of bleeding, perforation and infection. Patient does not have legal POA. I spoke  with his son Ella) and daughter Yevonne) via phone conference, discussed benefits and risks. [employer] provided verbal consent for EGD. - Patient is more alert today, somnolence waxed and wanes. Heather RN will provide an update regarding his neurostatus within the next hour and our GI service will further verify if the patient to proceed with EGD today. Addendum  11 am: Patient more awake, sat up in the chair per nursing staff.  - Continue Carvedilol  bid - Continue Pantoprazole  IV twice  daily - Continue Octreotide  infusion - Continue Ceftriaxone  1 g IV for infection prophylaxis in setting of patient with cirrhosis and GI bleed - Transfuse for  Hg level < 7 - CBC, CMP in am  - Nursing staff to document BM color and frequency,  document any bloody or black stools    Acute on chronic anemia. Hg 9.0 -> 7.8 -> 8.5 -> 7.5 -> 7.9 -> Hg 7.5 -> Hg 8.6 -> today Hg 8.5. -See plan above  -Continue to monitor patient for active GI bleeding   Alcohol use disorder. Last alcohol consumption was Friday 10/31.  On Lorazepam  and Librax per CIWA protocol.  No episodes of agitation overnight. - CIWA protocol in process  - Continue multivitamin once daily, Folate 1mg   once daily  and Thiamine  100mg  po once daily    Encephalopathy, possible HE + alcohol withdrawal/sedative effects from Ativan  per CIWA protocol. Ammonia 87 -> 85 -> 91. Lactulose  decreased to once daily 11/5 due to patient passing too many loose stools. No BM overnight or thus fare this morning.  - Nursing staff to monitor neuro status closely  - ? Head CT, defer to the hospitalist  - Increased Lactulose  20 gm po bid, titrate to no more than 3 to 4 loose stools daily    Thrombocytopenia secondary to cirrhosis and alcohol use disorder. No splenomegaly per CTAP 09/2023. PLT 47 -> 39 -> transfused one unit of platelets 11/2 -> PLT 56 -> 43 -> 48 -> 42 -> 55.   Coagulopathy secondary to cirrhosis. INR 1.3 -> 1.4.   Hypokalemia. K+ 3.2.  - KCL replacement per the hospitalist    History of colon polyps.  Colonoscopy 08/03/2023 identified multiple 1 to 5 mm polyps in the rectosigmoid colon, sigmoid and descending colon left in situ secondary to active GI bleed at that time. - Eventual future flexible sigmoidoscopy as an outpatient for removal of colon polyps   Rheumatoid arthritis on Plaquenil      Principal Problem:   Acute GI bleeding Active Problems:   ILD (interstitial lung disease) (HCC)   Essential hypertension    Anemia   Cirrhosis of liver with ascites (HCC)   Rheumatoid arthritis (HCC)   Thrombocytopenia   Hypertensive urgency   Melena   Bleeding esophageal varices (HCC)   Encephalopathy, hepatic (HCC)   Acute blood loss anemia     LOS: 3 days   [name]  11/28/2023, 8:46 AM

## 2023-11-28 NOTE — Interval H&P Note (Signed)
 History and Physical Interval Note:  11/28/2023 1:48 PM  Robert Greene  has presented today for surgery, with the diagnosis of upper GI bleed.  The various methods of treatment have been discussed with the patient and family. After consideration of risks, benefits and other options for treatment, the patient has consented to  Procedure(s): EGD (ESOPHAGOGASTRODUODENOSCOPY) (N/A) as a surgical intervention.  The patient's history has been reviewed, patient examined, no change in status, stable for surgery.  I have reviewed the patient's chart and labs.  Questions were answered to the patient's satisfaction.     [name]

## 2023-11-28 NOTE — Addendum Note (Signed)
 Addendum  created 11/28/23 1540 by Eilleen Fellows, CRNA   Clinical Note Signed, Intraprocedure Blocks edited, SmartForm saved

## 2023-11-28 NOTE — Anesthesia Procedure Notes (Addendum)
 Procedure Name: Intubation Date/Time: 11/28/2023 1:55 PM  Performed by: Eilleen Fellows, CRNAPre-anesthesia Checklist: Patient identified, Emergency Drugs available, Suction available and Patient being monitored Patient Re-evaluated:Patient Re-evaluated prior to induction Oxygen Delivery Method: Circle system utilized Preoxygenation: Pre-oxygenation with 100% oxygen Induction Type: IV induction and Rapid sequence Laryngoscope Size: Glidescope and 4 Grade View: Grade I Tube type: Oral Tube size: 7.5 mm Number of attempts: 1 Airway Equipment and Method: Stylet and Oral airway Placement Confirmation: ETT inserted through vocal cords under direct vision, positive ETCO2 and breath sounds checked- equal and bilateral Secured at: 22 cm Tube secured with: Tape Dental Injury: Teeth and Oropharynx as per pre-operative assessment

## 2023-11-28 NOTE — Op Note (Signed)
 [facility] Patient Name: Robert Greene Procedure Date: 11/28/2023 MRN: [id number] Attending MD: Lupita FORBES Commander , MD, [id number] Date of Birth: March 21, 1960 CSN: [id number] Age: 63 Admit Type: Inpatient Procedure:                Upper GI endoscopy Indications:              Coffee-ground emesis, Melena Patient has cirrhosis,                            history of bleeding varices and portal gastropathy                            Last EGD August 2025 with flat varices plus portal                            gastropathy Providers:                [name], MD, Randall Lines, RN, [place], Technician Referring MD:              Medicines:                General Anesthesia Complications:            No immediate complications. Estimated Blood Loss:     Estimated blood loss: none. Procedure:                Pre-Anesthesia Assessment:                           - Prior to the procedure, a History and Physical                            was performed, and patient medications and                            allergies were reviewed. The patient's tolerance of                            previous anesthesia was also reviewed. The risks                            and benefits of the procedure and the sedation                            options and risks were discussed with the patient.                            All questions were answered, and informed consent                            was obtained. Prior Anticoagulants: The patient has  taken no anticoagulant or antiplatelet agents. ASA                            Grade Assessment: IV - A patient with severe                            systemic disease that is a constant threat to life.                            After reviewing the risks and benefits, the patient                            was deemed in satisfactory condition to undergo the                             procedure.                           After obtaining informed consent, the endoscope was                            passed under direct vision. Throughout the                            procedure, the patient's blood pressure, pulse, and                            oxygen saturations were monitored continuously. The                            [id number]-[id number] ([id number]) Olympus endoscope was introduced                            through the mouth, and advanced to the second part                            of duodenum. The upper GI endoscopy was                            accomplished without difficulty. The patient                            tolerated the procedure well. Scope In: Scope Out: Findings:      Grade I varices were found in the lower third of the esophagus. They       were 4 mm in largest diameter. Estimated blood loss: none.      Severe portal hypertensive gastropathy was found in the cardia, in the       gastric fundus, in the gastric body and in the gastric antrum.      A submucosal polypoid deformity was found in the second portion of the       duodenum.      The cardia and gastric fundus were otherwise normal on retroflexion.      The exam was otherwise without abnormality. Impression:               -  Grade I esophageal varices. Not a cuse of bleeding                           - Portal hypertensive gastropathy. Suspect this was                            cause of coffee ground emesis and melena                           - Duodenal deformity. Submucosal polypoid lesion -                            soft and looks benign. Previously seen and                            unchanged. Looks innocent                           - The examination was otherwise normal.                           - No specimens collected. I called and updated his                            son, Robert Greene. Moderate Sedation:      Not Applicable - Patient had care per Anesthesia. Recommendation:            - Patient has a contact number available for                            emergencies. The signs and symptoms of potential                            delayed complications were discussed with the                            patient. Return to normal activities tomorrow.                            Written discharge instructions were provided to the                            patient.                           - Resume regular diet.                           - Return patient to hospital ward for ongoing care.                           - DC octreotide  (done)                           Make pantoprazole  40 mg po qd  increase lactulose  to 20 g tid                           continue ceftriaxone                            continue carvedilol  - see if we can increase dose                            when he is improved Procedure Code(s):        --- Professional ---                           [phone], Esophagogastroduodenoscopy, flexible,                            transoral; diagnostic, including collection of                            specimen(s) by brushing or washing, when performed                            (separate procedure) Diagnosis Code(s):        --- Professional ---                           I85.00, Esophageal varices without bleeding                           K76.6, Portal hypertension                           K31.89, Other diseases of stomach and duodenum                           K92.0, Hematemesis                           K92.1, Melena (includes Hematochezia) CPT copyright 2022 [employer]. All rights reserved. The codes documented in this report are preliminary and upon coder review may  be revised to meet current compliance requirements. Lupita FORBES Commander, MD 11/28/2023 2:22:59 PM This report has been signed electronically. Number of Addenda: 0

## 2023-11-28 NOTE — Progress Notes (Signed)
 PROGRESS NOTE    Robert Greene  FMW:[id number] DOB: 08/27/1960 DOA: 11/25/2023 PCP: [name], MD    Chief Complaint  Patient presents with   GI Bleeding    Brief Narrative:  Robert Greene is a 63 y.o. male with medical history significant for alcoholism, decompensated cirrhosis, rheumatoid arthritis, interstitial lung disease, and hypertension who presents with rectal bleeding and nausea with vomiting.    Assessment & Plan:   Principal Problem:   Acute GI bleeding Active Problems:   ILD (interstitial lung disease) (HCC)   Essential hypertension   Anemia   Cirrhosis of liver with ascites (HCC)   Rheumatoid arthritis (HCC)   Thrombocytopenia   Hypertensive urgency   Melena   Bleeding esophageal varices (HCC)   Encephalopathy, hepatic (HCC)   Acute blood loss anemia  1 chronic alcohol abuse/alcohol withdrawal, POA -Patient noted with waxing and waning mental status with some improvement with somnolence today. - Easily arousable. - Patient with noted tremors. - Patient noted to be unable to obtain consent for procedures scheduled today and per RN consent obtained from patient's family by GI. - Continue CIWA protocol, Librium  detox protocol, thiamine , multivitamin, folic acid . -Ammonia levels trending up currently at 91. - Continue lactulose .  2.  Anemia/concern for acute GI bleed -Currently on IV PPI, IV Rocephin , IV octreotide . - GI following due to patient's mental status unable to obtain consent initially and consent obtained from patient's family today. - Patient for EGD for further evaluation. - Follow H&H. - Transfusion threshold hemoglobin < 7. - GI following and appreciate input and recommendations.  3.  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 - Continue to Ativan  withdrawal protocol, Librium  detox protocol. - Continue lactulose . - GI following.  4.   Thrombocytopenia -Likely secondary to chronic alcohol use and cirrhosis. - Patient with no overt bleeding. - Transfusion threshold platelet count <20K or active bleeding.  5.  Rheumatoid arthritis; ILD -Noted to be managed on Enbrel and Plaquenil . - Continue Plaquenil .  6. hypertensive urgency, resolved -Noted in the setting of withdrawal, agitation, symptomatic anemia. - Follow.  7.?  Hepatic encephalopathy -Patient with some somnolence and some confusion. Ammonia levels elevated.- -Continue lactulose .   DVT prophylaxis: SCDs Code Status: Full Family Communication: Updated patient.  No family at bedside. Disposition: TBD  Status is: Inpatient Remains inpatient appropriate because: Severity of illness   Consultants:  GI: Dr. Leigh 11/25/2023  Procedures:  Chest x-ray 11/25/2023   Antimicrobials:  Anti-infectives (From admission, onward)    Start     Dose/Rate Route Frequency Ordered Stop   11/26/23 0200  cefTRIAXone  (ROCEPHIN ) 1 g in sodium chloride  0.9 % 100 mL IVPB        1 g 200 mL/hr over 30 Minutes Intravenous Every 24 hours 11/25/23 0416     11/25/23 1000  hydroxychloroquine  (PLAQUENIL ) tablet 200 mg        200 mg Oral Daily 11/25/23 0416     11/25/23 0200  cefTRIAXone  (ROCEPHIN ) 1 g in sodium chloride  0.9 % 100 mL IVPB        1 g 200 mL/hr over 30 Minutes Intravenous  Once 11/25/23 0147 11/25/23 0301         Subjective: Patient sleeping deeply but easily arousable. Patient denies any SOB.  States last bowel movement was 2 days ago and it was a dark maroon-colored stool.  Denies any chest pain or shortness of breath.  Denies any significant abdominal pain.    Objective: Vitals:   11/27/23 1500 11/27/23 1706 11/27/23 2045 11/28/23 0501  BP:  115/68 (!) 155/80 118/72  Pulse:  72 78 67  Resp: 14  16   Temp:  98.4 F (36.9 C) 98.2 F (36.8 C) 97.8 F (36.6 C)  TempSrc:  Axillary Oral Oral  SpO2:  92% 99% 97%  Weight:      Height:         Intake/Output Summary (Last 24 hours) at 11/28/2023 1117 Last data filed at 11/28/2023 9357 Gross per 24 hour  Intake 1487.96 ml  Output 600 ml  Net 887.96 ml   Filed Weights   11/25/23 1255  Weight: 91.9 kg    Examination:  General exam: Appears calm and comfortable. Some tremors. Respiratory system: Clear to auscultation anterior lung fields.  No wheezes, no crackles, no rhonchi.SABRA Respiratory effort normal. Cardiovascular system: S1 & S2 heard, RRR. No JVD, murmurs, rubs, gallops or clicks. No pedal edema. Gastrointestinal system: Abdomen is nondistended, soft and nontender. No organomegaly or masses felt. Normal bowel sounds heard. Central nervous system: Alert and oriented. No focal neurological deficits. Extremities: Symmetric 5 x 5 power. Skin: No rashes, lesions or ulcers Psychiatry: Judgement and insight appear normal. Mood & affect appropriate.     Data Reviewed: I have personally reviewed following labs and imaging studies  CBC: Recent Labs  Lab 11/25/23 0136 11/25/23 0429 11/26/23 1104 11/26/23 1705 11/26/23 2206 11/27/23 0424 11/28/23 0429  WBC 4.9   < > 4.1 4.0 5.3 5.6 6.5  NEUTROABS 3.3  --   --   --   --   --   --   HGB 9.0*   < > 7.8* 8.0* 8.6* 8.6* 8.5*  HCT 29.5*   < > 25.7* 27.1* 29.5* 29.1* 29.2*  MCV 76.4*   < > 77.9* 78.6* 80.6 79.1* 79.8*  PLT 47*   < > 44* 45* 53* 55* 60*   < > = values in this interval not displayed.    Basic Metabolic Panel: Recent Labs  Lab 11/25/23 0136 11/25/23 0429 11/26/23 0358 11/27/23 0424 11/28/23 0429  NA 136 136 138 140 140  K 3.5 3.4* 3.5 3.5 3.2*  CL 101 105 106 108 110  CO2 25 22 21* 21* 21*  GLUCOSE 131* 100* 87 106* 98  BUN 12 12 11 12 14   CREATININE 0.85 0.72 0.87 0.95 0.96  CALCIUM  9.4 8.9 8.7* 9.2 8.5*  MG  --  1.8  --   --  2.0    GFR: Estimated Creatinine Clearance: 88.2 mL/min (by C-G formula based on SCr of 0.96 mg/dL).  Liver Function Tests: Recent Labs  Lab 11/25/23 0136  11/25/23 0429 11/26/23 0358 11/27/23 0424 11/28/23 0429  AST 76* 64* 84* 84* 60*  ALT 33 27 30 36 30  ALKPHOS 128* 111 110 121 110  BILITOT 2.7* 2.3* 2.1* 2.5* 2.3*  PROT 7.2 6.3* 6.3* 6.8 6.3*  ALBUMIN  3.7 3.3* 3.4* 3.6 3.3*    CBG: Recent Labs  Lab 11/26/23 0714  GLUCAP 105*     No results found for this or any previous visit (from the past 240 hours).       Radiology Studies: No results found.      Scheduled Meds:  sodium chloride    Intravenous Once   carvedilol   3.125 mg Oral BID WC   chlordiazePOXIDE   25 mg Oral Q1500   feeding supplement  1 Container Oral TID BM  folic acid   1 mg Oral Daily   hydroxychloroquine   200 mg Oral Daily   lactulose   20 g Oral BID   LORazepam   0-4 mg Intravenous Q8H   losartan   100 mg Oral Daily   multivitamin with minerals  1 tablet Oral Daily   pantoprazole  (PROTONIX ) IV  40 mg Intravenous Q12H   sodium chloride  flush  3 mL Intravenous Q12H   thiamine   100 mg Oral Daily   Or   thiamine   100 mg Intravenous Daily   Continuous Infusions:  cefTRIAXone  (ROCEPHIN )  IV 1 g (11/28/23 0221)   lactated ringers  75 mL/hr at 11/27/23 1735   octreotide  (SANDOSTATIN ) 500 mcg in sodium chloride  0.9 % 250 mL (2 mcg/mL) infusion 50 mcg/hr (11/28/23 1032)     LOS: 3 days    Time spent: 40 minutes    Toribio Hummer, MD [facility]   To contact the attending provider between 7A-7P or the covering provider during after hours 7P-7A, please log into the web site [email or web address] and access using universal [place] password for that web site. If you do not have the password, please call the hospital operator.  11/28/2023, 11:17 AM

## 2023-11-28 NOTE — TOC Initial Note (Signed)
 Transition of Care [facility]) - Initial/Assessment Note    Patient Details  Name: Robert Greene MRN: [id number] Date of Birth: 12/18/1960  Transition of Care [facility]) CM/SW Contact:    Tawni CHRISTELLA Eva, LCSW Phone Number: 11/28/2023, 4:00 PM  Clinical Narrative:                  CSW was unable to speak with the pt at this time, as the pt is not oriented. CSW spoke with the pt's son, [name], who reported that the pt lives at home and is independent at baseline. He stated that the pt currently has home health services through [facility] but is unsure if the pt would like to continue these services. [name] indicated that he will speak with his father regarding this matter. He also stated that either he or his sister will pick the pt up from the hospital upon discharge. Care management to follow.    Expected Discharge Plan: Home w Home Health Services Barriers to Discharge: Continued Medical Work up   Patient Goals and CMS Choice Patient states their goals for this hospitalization and ongoing recovery are:: retrun home          Expected Discharge Plan and Services       Living arrangements for the past 2 months: Single Family Home                                      Prior Living Arrangements/Services Living arrangements for the past 2 months: Single Family Home Lives with:: Self Patient language and need for interpreter reviewed:: Yes        Need for Family Participation in Patient Care: No (Comment) Care giver support system in place?: No (comment)   Criminal Activity/Legal Involvement Pertinent to Current Situation/Hospitalization: No - Comment as needed  Activities of Daily Living   ADL Screening (condition at time of admission) Independently performs ADLs?: Yes (appropriate for developmental age) Is the patient deaf or have difficulty hearing?: No Does the patient have difficulty seeing, even when wearing glasses/contacts?: No Does the patient have  difficulty concentrating, remembering, or making decisions?: No  Permission Sought/Granted                  Emotional Assessment Appearance:: Appears stated age     Orientation: : Oriented to Self      Admission diagnosis:  Acute GI bleeding [K92.2] Gastrointestinal hemorrhage, unspecified gastrointestinal hemorrhage type [K92.2] Patient Active Problem List   Diagnosis Date Noted   Coffee ground emesis 11/28/2023   Encephalopathy, hepatic (HCC) 11/26/2023   Acute blood loss anemia 11/26/2023   Acute GI bleeding 11/25/2023   Rheumatoid arthritis (HCC) 11/25/2023   Thrombocytopenia 11/25/2023   Hypertensive urgency 11/25/2023   Melena 11/25/2023   Bleeding esophageal varices (HCC) 11/25/2023   Falls frequently 09/20/2023   Hypokalemia 09/19/2023   Pancytopenia (HCC) 09/19/2023   Weakness 09/14/2023   Gastritis without bleeding 08/03/2023   Cirrhosis of liver with ascites (HCC) 08/02/2023   Rectal bleeding 08/02/2023   GI bleed 08/01/2023   Hypotension 01/30/2023   Alcohol use disorder, moderate, dependence (HCC) 01/30/2023   Colon cancer screening 05/13/2022   Alcoholic cirrhosis of liver with ascites (HCC) 05/13/2022   Secondary esophageal varices without bleeding (HCC) 11/10/2021   Portal hypertensive gastropathy (HCC) 11/10/2021   Duodenal nodule 11/10/2021   Alcoholic cirrhosis of liver without ascites (HCC) 07/16/2021   Anemia 07/16/2021  History of colon polyps 07/16/2021   Alcohol use disorder, severe, dependence (HCC) 07/16/2021   Atherosclerosis of both carotid arteries 07/07/2019   Essential hypertension 04/08/2018   Coronary artery calcification seen on CAT scan 04/08/2018   Laboratory examination 04/08/2018   Mixed hyperlipidemia 04/08/2018   ILD (interstitial lung disease) (HCC) 12/06/2017   PCP:  [name], MD Pharmacy:   [facility] [phone] - [place] [place] - [address] AT [facility] [address] [place]  [address] Phone: [phone] Fax: [phone]     Social Drivers of Health (SDOH) Social History: SDOH Screenings   Food Insecurity: No Food Insecurity (11/25/2023)  Housing: Low Risk  (11/25/2023)  Transportation Needs: No Transportation Needs (11/25/2023)  Utilities: Not At Risk (11/25/2023)  Tobacco Use: High Risk (11/25/2023)   SDOH Interventions:     Readmission Risk Interventions     No data to display

## 2023-11-28 NOTE — Anesthesia Postprocedure Evaluation (Signed)
 Anesthesia Post Note  [name]  Procedure(s) Performed: EGD (ESOPHAGOGASTRODUODENOSCOPY)     Patient location during evaluation: Endoscopy Anesthesia Type: General Level of consciousness: awake Pain management: pain level controlled Vital Signs Assessment: post-procedure vital signs reviewed and stable Respiratory status: spontaneous breathing Cardiovascular status: blood pressure returned to baseline Postop Assessment: no apparent nausea or vomiting Anesthetic complications: no   No notable events documented.  Last Vitals:  Vitals:   11/28/23 1445 11/28/23 1450  BP:  (!) 131/58  Pulse: 69 66  Resp: 14 17  Temp:    SpO2: 98% 96%    Last Pain:  Vitals:   11/28/23 1450  TempSrc:   PainSc: Asleep                 Lauraine KATHEE [place]

## 2023-11-28 NOTE — Consult Note (Addendum)
Patient up to chair with 2 assist.

## 2023-11-28 NOTE — H&P (View-Only) (Signed)
 [facility] Progress Note  CC:  GI bleed    Subjective: Patient is less somnolent this morning.  Nonagitated at this time.  I turned on his lights and elevated the head of the bed to a sitting position.  He denies having any nausea or vomiting.  No abdominal pain.  No bowel movements overnight or thus far this morning as verified by his RN.    Objective:  Vital signs in last 24 hours: Temp:  [97.8 F (36.6 C)-98.5 F (36.9 C)] 97.8 F (36.6 C) (11/05 0501) Pulse Rate:  [66-87] 67 (11/05 0501) Resp:  [14-16] 16 (11/04 2045) BP: (115-155)/(68-93) 118/72 (11/05 0501) SpO2:  [92 %-100 %] 97 % (11/05 0501) Last BM Date : 11/27/23 General: 63 year old male somnolent but more easily arousable today.  No acute distress. Eyes: Mild scleral icterus. Heart: Regular rate and rhythm, no murmurs. Pulm: Breath sounds clear, decreased in the bases.  On room air. Abdomen:  Protuberant. Nontender. No ascites. Positive bowel sounds x 4 quadrants. No palpable hepatosplenomegaly. No palpable mass.  Extremities: No lower extremity edema. Neurologic: Somnolent but arousable.  Alert to name, place but not to time.  He stated he was in the hospital due to internal bleeding.  No asterixis. Psych: Not agitated.  Intake/Output from previous day: 11/04 0701 - 11/05 0700 In: 1488 [P.O.:360; I.V.:1028; IV Piggyback:100] Out: 600 [Urine:600] Intake/Output this shift: No intake/output data recorded.  Lab Results: Recent Labs    11/26/23 2206 11/27/23 0424 11/28/23 0429  WBC 5.3 5.6 6.5  HGB 8.6* 8.6* 8.5*  HCT 29.5* 29.1* 29.2*  PLT 53* 55* 60*   BMET Recent Labs    11/26/23 0358 11/27/23 0424 11/28/23 0429  NA 138 140 140  K 3.5 3.5 3.2*  CL 106 108 110  CO2 21* 21* 21*  GLUCOSE 87 106* 98  BUN 11 12 14   CREATININE 0.87 0.95 0.96  CALCIUM  8.7* 9.2 8.5*   LFT Recent Labs    11/27/23 0424 11/28/23 0429  PROT 6.8 6.3*  ALBUMIN  3.6 3.3*  AST 84* 60*  ALT 36 30   ALKPHOS 121 110  BILITOT 2.5* 2.3*  BILIDIR 1.3*  --    PT/INR Recent Labs    11/26/23 1104 11/28/23 0429  LABPROT 17.4* 17.8*  INR 1.3* 1.4*   Hepatitis Panel No results for input(s): HEPBSAG, HCVAB, HEPAIGM, HEPBIGM in the last 72 hours.  No results found.  Patient Profile: Robert Greene is a 63 y.o. male with past medical history of rheumatoid arthritis, interstitial lung disease, decompensated alcohol associated cirrhosis complicated by esophageal varices/ascites in the past, alcohol use disorder, colon polyps and colonic AVM. Admitted to the hospital 11/25/2023 with concern for GI bleeding.   Assessment / Plan:  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 MELD 3.0: 11. Admitted with dark stools and suspected hematemesis concerning for UGI  bleed. On Protonix  IV and Octreotide  infusion. On Ceftriaxone . AST 84 -> 60. ALT 30. Alk phos 110. T. Bili 2.1 -> 2.5 -> 2.3. Direct bili 1.3. Hg 8.6 -> 8.5. EGD cancelled 11/3 and 11/4 due to somnolence/alcohol withdrawal on Lorazepam  and Librium .  EGD rescheduled today. - NPO  - Hold next scheduled dose of Lorazepam  and Librax if no active signs of agitation/alcohol withdrawal, discussed with RN - EGD today, benefits and risks discussed including risk with sedation, risk of bleeding, perforation and infection. Patient does not have legal POA. I spoke  with his son Ella) and daughter Yevonne) via phone conference, discussed benefits and risks. [employer] provided verbal consent for EGD. - Patient is more alert today, somnolence waxed and wanes. Heather RN will provide an update regarding his neurostatus within the next hour and our GI service will further verify if the patient to proceed with EGD today. Addendum  11 am: Patient more awake, sat up in the chair per nursing staff.  - Continue Carvedilol  bid - Continue Pantoprazole  IV twice  daily - Continue Octreotide  infusion - Continue Ceftriaxone  1 g IV for infection prophylaxis in setting of patient with cirrhosis and GI bleed - Transfuse for  Hg level < 7 - CBC, CMP in am  - Nursing staff to document BM color and frequency,  document any bloody or black stools    Acute on chronic anemia. Hg 9.0 -> 7.8 -> 8.5 -> 7.5 -> 7.9 -> Hg 7.5 -> Hg 8.6 -> today Hg 8.5. -See plan above  -Continue to monitor patient for active GI bleeding   Alcohol use disorder. Last alcohol consumption was Friday 10/31.  On Lorazepam  and Librax per CIWA protocol.  No episodes of agitation overnight. - CIWA protocol in process  - Continue multivitamin once daily, Folate 1mg   once daily  and Thiamine  100mg  po once daily    Encephalopathy, possible HE + alcohol withdrawal/sedative effects from Ativan  per CIWA protocol. Ammonia 87 -> 85 -> 91. Lactulose  decreased to once daily 11/5 due to patient passing too many loose stools. No BM overnight or thus fare this morning.  - Nursing staff to monitor neuro status closely  - ? Head CT, defer to the hospitalist  - Increased Lactulose  20 gm po bid, titrate to no more than 3 to 4 loose stools daily    Thrombocytopenia secondary to cirrhosis and alcohol use disorder. No splenomegaly per CTAP 09/2023. PLT 47 -> 39 -> transfused one unit of platelets 11/2 -> PLT 56 -> 43 -> 48 -> 42 -> 55.   Coagulopathy secondary to cirrhosis. INR 1.3 -> 1.4.   Hypokalemia. K+ 3.2.  - KCL replacement per the hospitalist    History of colon polyps.  Colonoscopy 08/03/2023 identified multiple 1 to 5 mm polyps in the rectosigmoid colon, sigmoid and descending colon left in situ secondary to active GI bleed at that time. - Eventual future flexible sigmoidoscopy as an outpatient for removal of colon polyps   Rheumatoid arthritis on Plaquenil      Principal Problem:   Acute GI bleeding Active Problems:   ILD (interstitial lung disease) (HCC)   Essential hypertension    Anemia   Cirrhosis of liver with ascites (HCC)   Rheumatoid arthritis (HCC)   Thrombocytopenia   Hypertensive urgency   Melena   Bleeding esophageal varices (HCC)   Encephalopathy, hepatic (HCC)   Acute blood loss anemia     LOS: 3 days   [name]  11/28/2023, 8:46 AM

## 2023-11-29 ENCOUNTER — Encounter (HOSPITAL_COMMUNITY): Payer: Self-pay | Admitting: Internal Medicine

## 2023-11-29 DIAGNOSIS — K7031 Alcoholic cirrhosis of liver with ascites: Secondary | ICD-10-CM | POA: Diagnosis not present

## 2023-11-29 DIAGNOSIS — D62 Acute posthemorrhagic anemia: Secondary | ICD-10-CM | POA: Diagnosis not present

## 2023-11-29 DIAGNOSIS — I10 Essential (primary) hypertension: Secondary | ICD-10-CM | POA: Diagnosis not present

## 2023-11-29 DIAGNOSIS — K921 Melena: Secondary | ICD-10-CM | POA: Diagnosis not present

## 2023-11-29 DIAGNOSIS — K7682 Hepatic encephalopathy: Secondary | ICD-10-CM | POA: Diagnosis not present

## 2023-11-29 DIAGNOSIS — K922 Gastrointestinal hemorrhage, unspecified: Secondary | ICD-10-CM | POA: Diagnosis not present

## 2023-11-29 LAB — COMPREHENSIVE METABOLIC PANEL WITH GFR
ALT: 23 U/L (ref 0–44)
AST: 46 U/L — ABNORMAL HIGH (ref 15–41)
Albumin: 3.1 g/dL — ABNORMAL LOW (ref 3.5–5.0)
Alkaline Phosphatase: 106 U/L (ref 38–126)
Anion gap: 8 (ref 5–15)
BUN: 13 mg/dL (ref 8–23)
CO2: 20 mmol/L — ABNORMAL LOW (ref 22–32)
Calcium: 8.4 mg/dL — ABNORMAL LOW (ref 8.9–10.3)
Chloride: 111 mmol/L (ref 98–111)
Creatinine, Ser: 0.83 mg/dL (ref 0.61–1.24)
GFR, Estimated: >60 mL/min (ref >60)
Glucose, Bld: 151 mg/dL — ABNORMAL HIGH (ref 70–99)
Potassium: 3.6 mmol/L (ref 3.5–5.1)
Sodium: 139 mmol/L (ref 135–145)
Total Bilirubin: 1.8 mg/dL — ABNORMAL HIGH (ref 0.0–1.2)
Total Protein: 5.9 g/dL — ABNORMAL LOW (ref 6.5–8.1)

## 2023-11-29 LAB — AMMONIA: Ammonia: 71 umol/L — ABNORMAL HIGH (ref 9–35)

## 2023-11-29 LAB — CBC
HCT: 25.7 % — ABNORMAL LOW (ref 39.0–52.0)
HCT: 25.2 % — ABNORMAL LOW (ref 39.0–52.0)
Hemoglobin: 7.6 g/dL — ABNORMAL LOW (ref 13.0–17.0)
Hemoglobin: 7.4 g/dL — ABNORMAL LOW (ref 13.0–17.0)
MCH: 24 pg — ABNORMAL LOW (ref 26.0–34.0)
MCH: 23.5 pg — ABNORMAL LOW (ref 26.0–34.0)
MCHC: 29.6 g/dL — ABNORMAL LOW (ref 30.0–36.0)
MCHC: 29.4 g/dL — ABNORMAL LOW (ref 30.0–36.0)
MCV: 81.1 fL (ref 80.0–100.0)
MCV: 80 fL (ref 80.0–100.0)
Platelets: 54 K/uL — ABNORMAL LOW (ref 150–400)
Platelets: 55 K/uL — ABNORMAL LOW (ref 150–400)
RBC: 3.17 MIL/uL — ABNORMAL LOW (ref 4.22–5.81)
RBC: 3.15 MIL/uL — ABNORMAL LOW (ref 4.22–5.81)
RDW: 22.9 % — ABNORMAL HIGH (ref 11.5–15.5)
RDW: 22.9 % — ABNORMAL HIGH (ref 11.5–15.5)
WBC: 6.3 K/uL (ref 4.0–10.5)
WBC: 7.8 K/uL (ref 4.0–10.5)
nRBC: 0 % (ref 0.0–0.2)
nRBC: 0 % (ref 0.0–0.2)

## 2023-11-29 LAB — MAGNESIUM: Magnesium: 2.1 mg/dL (ref 1.7–2.4)

## 2023-11-29 MED ORDER — POTASSIUM CHLORIDE CRYS ER 20 MEQ PO TBCR
40.0000 meq | EXTENDED_RELEASE_TABLET | Freq: Once | ORAL | Status: AC
  Administered 2023-11-29: 40 meq via ORAL
  Filled 2023-11-29: qty 2

## 2023-11-29 NOTE — Progress Notes (Signed)
 PROGRESS NOTE    Robert Greene  FMW:[id number] DOB: 15-Aug-1960 DOA: 11/25/2023 PCP: [name], MD    Chief Complaint  Patient presents with   GI Bleeding    Brief Narrative:  Robert Greene is a 63 y.o. male with medical history significant for alcoholism, decompensated cirrhosis, rheumatoid arthritis, interstitial lung disease, and hypertension who presents with rectal bleeding and nausea with vomiting.    Assessment & Plan:   Principal Problem:   Acute GI bleeding Active Problems:   ILD (interstitial lung disease) (HCC)   Essential hypertension   Anemia   Cirrhosis of liver with ascites (HCC)   Rheumatoid arthritis (HCC)   Thrombocytopenia   Hypertensive urgency   Melena   Bleeding esophageal varices (HCC)   Encephalopathy, hepatic (HCC)   Acute blood loss anemia   Coffee ground emesis  1 chronic alcohol abuse/alcohol withdrawal, POA -Patient noted with waxing and waning mental status with some improvement with somnolence earlier on.  - Patient sedated at time of visit today and as such was drowsy.  - Patient with noted tremors. - Continue CIWA protocol, Librium  detox protocol, thiamine , multivitamin, folic acid . -Ammonia levels fluctuating and currently at 71. - Lactulose  dose increased to 3 times daily per GI.   - Supportive care.   2.  Anemia/concern for acute GI bleed - Was on IV PPI, IV Rocephin , IV octreotide . - GI following due to patient's mental status unable to obtain consent initially and consent obtained from patient's family. - Patient status post EGD 11/28/2023 which showed grade 1 esophageal varices, portal hypertensive gastropathy which per GI believes were the cause of patient's coffee-ground emesis and melena.  Duodenal deformity, submucosal polyploid lesion soft and looks benign previously seen and unchanged per GI.   - GI recommended a regular diet which patient has been placed on, discontinuation of octreotide  which was done, PPI changed to  Protonix  40 mg p.o. daily and lactulose  increased to 20 g 3 times daily.   - GI recommending continuation of IV Rocephin .   - Continuation of carvedilol .  - Follow H&H. - Transfusion threshold hemoglobin < 7. - GI following and appreciate input and recommendations.  3.  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 - Continue to Ativan  withdrawal protocol, Librium  detox protocol. - Continue lactulose . - GI following.  4.  Thrombocytopenia -Likely secondary to chronic alcohol use and cirrhosis. - Patient with no overt bleeding. -Platelet count seems to be stabilizing at 55K. - Transfusion threshold platelet count <20K or active bleeding.  5.  Rheumatoid arthritis; ILD -Noted to be managed on Enbrel and Plaquenil . - Continue Plaquenil .  6. hypertensive urgency, resolved -Noted in the setting of withdrawal, agitation, symptomatic anemia. - Resolved.  7.?  Hepatic encephalopathy -Patient with some somnolence and some confusion. Ammonia levels elevated.- -Lactulose  dose increased to 3 times daily per GI.    DVT prophylaxis: SCDs Code Status: Full Family Communication: Updated patient.  No family at bedside. Disposition: TBD  Status is: Inpatient Remains inpatient appropriate because: Severity of illness   Consultants:  GI: Dr. Leigh 11/25/2023  Procedures:  Chest x-ray 11/25/2023 Upper endoscopy per GI: Dr. Avram 11/28/2023  Antimicrobials:  Anti-infectives (From admission, onward)    Start     Dose/Rate Route Frequency Ordered Stop   11/26/23 0200  cefTRIAXone  (ROCEPHIN ) 1 g in sodium chloride  0.9 % 100 mL IVPB        1 g  200 mL/hr over 30 Minutes Intravenous Every 24 hours 11/25/23 0416     11/25/23 1000  hydroxychloroquine  (PLAQUENIL ) tablet 200 mg        200 mg Oral Daily 11/25/23 0416     11/25/23 0200  cefTRIAXone  (ROCEPHIN ) 1 g in sodium chloride  0.9 % 100 mL IVPB        1  g 200 mL/hr over 30 Minutes Intravenous  Once 11/25/23 0147 11/25/23 0301         Subjective: Patient sleeping.  Sedated noted to have just received IV Ativan  per RN.  Patient noted with some tremors per RN.  Patient early on noted to be alert and interactive.   Objective: Vitals:   11/28/23 1505 11/28/23 2145 11/29/23 0436 11/29/23 1111  BP: 137/84 133/82 137/81 139/81  Pulse: 68 64 69 79  Resp: 20 16 17    Temp: 98.6 F (37 C) 98.5 F (36.9 C) 98.2 F (36.8 C)   TempSrc: Oral Oral Oral   SpO2: 100% 99% 98%   Weight:      Height:        Intake/Output Summary (Last 24 hours) at 11/29/2023 1244 Last data filed at 11/29/2023 0933 Gross per 24 hour  Intake 1861.65 ml  Output 525 ml  Net 1336.65 ml   Filed Weights   11/25/23 1255  Weight: 91.9 kg    Examination:  General exam: Sedated.  Respiratory system: CTAB anterior lung fields.  No wheezes, no crackles, no rhonchi.  No use of accessory muscles of respiration.  Normal respiratory effort.  Cardiovascular system: Regular rate rhythm no murmurs rubs or gallops.  No JVD.  No pitting lower extremity edema. Gastrointestinal system: Abdomen is nondistended, soft and nontender. No organomegaly or masses felt. Normal bowel sounds heard. Central nervous system: Sedated.  Moving extremities spontaneously.. No focal neurological deficits. Extremities: Symmetric 5 x 5 power. Skin: No rashes, lesions or ulcers Psychiatry: Judgement and insight unable to assess. Mood & affect appropriate.     Data Reviewed: I have personally reviewed following labs and imaging studies  CBC: Recent Labs  Lab 11/25/23 0136 11/25/23 0429 11/26/23 1705 11/26/23 2206 11/27/23 0424 11/28/23 0429 11/29/23 0431  WBC 4.9   < > 4.0 5.3 5.6 6.5 6.3  NEUTROABS 3.3  --   --   --   --   --   --   HGB 9.0*   < > 8.0* 8.6* 8.6* 8.5* 7.6*  HCT 29.5*   < > 27.1* 29.5* 29.1* 29.2* 25.7*  MCV 76.4*   < > 78.6* 80.6 79.1* 79.8* 81.1  PLT 47*   < > 45*  53* 55* 60* 54*   < > = values in this interval not displayed.    Basic Metabolic Panel: Recent Labs  Lab 11/25/23 0429 11/26/23 0358 11/27/23 0424 11/28/23 0429 11/29/23 0431  NA 136 138 140 140 139  K 3.4* 3.5 3.5 3.2* 3.6  CL 105 106 108 110 111  CO2 22 21* 21* 21* 20*  GLUCOSE 100* 87 106* 98 151*  BUN 12 11 12 14 13   CREATININE 0.72 0.87 0.95 0.96 0.83  CALCIUM  8.9 8.7* 9.2 8.5* 8.4*  MG 1.8  --   --  2.0 2.1    GFR: Estimated Creatinine Clearance: 102 mL/min (by C-G formula based on SCr of 0.83 mg/dL).  Liver Function Tests: Recent Labs  Lab 11/25/23 0429 11/26/23 0358 11/27/23 0424 11/28/23 0429 11/29/23 0431  AST 64* 84* 84* 60* 46*  ALT 27  30 36 30 23  ALKPHOS 111 110 121 110 106  BILITOT 2.3* 2.1* 2.5* 2.3* 1.8*  PROT 6.3* 6.3* 6.8 6.3* 5.9*  ALBUMIN  3.3* 3.4* 3.6 3.3* 3.1*    CBG: Recent Labs  Lab 11/26/23 0714  GLUCAP 105*     No results found for this or any previous visit (from the past 240 hours).       Radiology Studies: No results found.      Scheduled Meds:  sodium chloride    Intravenous Once   carvedilol   3.125 mg Oral BID WC   chlordiazePOXIDE   25 mg Oral Q1500   feeding supplement  1 Container Oral TID BM   folic acid   1 mg Oral Daily   hydroxychloroquine   200 mg Oral Daily   lactulose   20 g Oral TID   losartan   100 mg Oral Daily   multivitamin with minerals  1 tablet Oral Daily   pantoprazole   40 mg Oral QAC breakfast   sodium chloride  flush  3 mL Intravenous Q12H   thiamine   100 mg Oral Daily   Or   thiamine   100 mg Intravenous Daily   Continuous Infusions:  cefTRIAXone  (ROCEPHIN )  IV Stopped (11/29/23 0143)   lactated ringers  75 mL/hr at 11/29/23 0629     LOS: 4 days    Time spent: 40 minutes    Toribio Hummer, MD [facility]   To contact the attending provider between 7A-7P or the covering provider during after hours 7P-7A, please log into the web site [email or web address] and access using  universal [place] password for that web site. If you do not have the password, please call the hospital operator.  11/29/2023, 12:44 PM

## 2023-11-29 NOTE — Progress Notes (Addendum)
 [facility] Progress Note  CC:  GI bleed, cirrhosis  Subjective: Per tech that is the sitter at bedside, he was very awake this morning and ate a good breakfast, very talkative, but then became nauseous and kind of shaky and was given Ativan  so now has been kind of in and out of it and very sleepy.  Tech says that he had a loose brown bowel movement today.  Objective:  Vital signs in last 24 hours: Temp:  [98.2 F (36.8 C)-98.7 F (37.1 C)] 98.2 F (36.8 C) (11/06 0436) Pulse Rate:  [64-79] 79 (11/06 1111) Resp:  [14-26] 17 (11/06 0436) BP: (116-144)/(49-84) 139/81 (11/06 1111) SpO2:  [96 %-100 %] 98 % (11/06 0436) Last BM Date : 11/28/23 General: Sleepy, does mumble responses, in NAD Heart:  Regular rate and rhythm; no murmurs Pulm:  CTAB.  No W/R/R. Abdomen:  Soft, non-distended.  BS present.  Non-tender. Extremities:  SCDs in place. Neurologic:  Alert and oriented x 4;  grossly normal neurologically. Psych:  Alert and cooperative. Normal mood and affect.  Intake/Output from previous day: 11/05 0701 - 11/06 0700 In: 1621.7 [P.O.:120; I.V.:1401.7; IV Piggyback:100] Out: 1075 [Urine:1075] Intake/Output this shift: Total I/O In: 240 [P.O.:240] Out: -   Lab Results: Recent Labs    11/27/23 0424 11/28/23 0429 11/29/23 0431  WBC 5.6 6.5 6.3  HGB 8.6* 8.5* 7.6*  HCT 29.1* 29.2* 25.7*  PLT 55* 60* 54*   BMET Recent Labs    11/27/23 0424 11/28/23 0429 11/29/23 0431  NA 140 140 139  K 3.5 3.2* 3.6  CL 108 110 111  CO2 21* 21* 20*  GLUCOSE 106* 98 151*  BUN 12 14 13   CREATININE 0.95 0.96 0.83  CALCIUM  9.2 8.5* 8.4*   LFT Recent Labs    11/27/23 0424 11/28/23 0429 11/29/23 0431  PROT 6.8   < > 5.9*  ALBUMIN  3.6   < > 3.1*  AST 84*   < > 46*  ALT 36   < > 23  ALKPHOS 121   < > 106  BILITOT 2.5*   < > 1.8*  BILIDIR 1.3*  --   --    < > = values in this interval not displayed.   PT/INR Recent Labs    11/28/23 0429  LABPROT 17.8*   INR 1.4*   Assessment / Plan: 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 MELD 3.0: 11. Admitted with dark stools and suspected hematemesis concerning for UGI  bleed. On Protonix  IV and Octreotide  infusion. On Ceftriaxone . AST 84 -> 60. ALT 30. Alk phos 110. T. Bili 2.1 -> 2.5 -> 2.3. Direct bili 1.3. Hg 8.6 -> 8.5. EGD cancelled 11/3 and 11/4 due to somnolence/alcohol withdrawal on Lorazepam  and Librium .  EGD 11/5:  - Grade I esophageal varices. Not a cuse of bleeding - Portal hypertensive gastropathy. Suspect this was cause of coffee ground emesis and melena - Duodenal deformity. Submucosal polypoid lesion - soft and looks benign. Previously seen and unchanged. Looks innocent - The examination was otherwise normal. - No specimens collected. - Continue Carvedilol  bid - Continue Pantoprazole  40 mg daily. - Continue Ceftriaxone  1 g IV for infection prophylaxis in setting of patient with cirrhosis and GI bleed - Transfuse for  Hg level < 7 - Nursing staff to document BM color and frequency, document any bloody or black stools    Acute on chronic anemia. Hg 9.0 -> 7.8 ->  8.5 -> 7.5 -> 7.9 -> Hg 7.5 -> Hg 8.6 -> today Hg 8.5->7.6 grams today, but no sign of bleeding. -Continue to monitor patient for active GI bleeding   Alcohol use disorder. Last alcohol consumption was Friday 10/31.  On Lorazepam  and Librax per CIWA protocol.  - CIWA protocol in process  - Continue multivitamin once daily, Folate 1mg   once daily  and Thiamine  100mg  po once daily    Encephalopathy, possible HE + alcohol withdrawal/sedative effects from Ativan  per CIWA protocol. Ammonia 87 -> 85 -> 91->71. Lactulose  decreased to once daily 11/5 due to patient passing too many loose stools. Had a loose brown BM this AM.  - Nursing staff to monitor neuro status closely  - Increased Lactulose  20 gm po tid, titrate to no more than 3 to 4 loose  stools daily    Thrombocytopenia secondary to cirrhosis and alcohol use disorder. No splenomegaly per CTAP 09/2023. PLT 47 -> 39 -> transfused one unit of platelets 11/2 -> PLT 56 -> 43 -> 48 -> 42 -> 55->60->54.    Coagulopathy secondary to cirrhosis. INR 1.3 -> 1.4.    Hypokalemia. K+ 3.6 today.    History of colon polyps.  Colonoscopy 08/03/2023 identified multiple 1 to 5 mm polyps in the rectosigmoid colon, sigmoid and descending colon left in situ secondary to active GI bleed at that time. - Eventual future flexible sigmoidoscopy as an outpatient for removal of colon polyps   Rheumatoid arthritis on Plaquenil      LOS: 4 days   Jessica D. Zehr  11/29/2023, 12:05 PM     [facility] Attending   I have taken an interval history, reviewed the chart and examined the patient.  The majority the medical decision making was performed by me.  I agree with the Advanced Practitioner's note, impression and recommendations with the following additions:  He is improved since admission, does not have signs of GI bleeding anymore but continues to struggle with needing sedation for withdrawal symptoms and probable component of hepatic encephalopathy.  Continue care as above.  We are following.  [name], MD, [facility] See TRACEY on call - gastroenterology for best contact person 11/29/2023 4:04 PM

## 2023-11-29 NOTE — Progress Notes (Signed)
 Mobility Specialist - Progress Note   11/29/23 1014  Mobility  Activity Pivoted/transferred to/from [facility]  Level of Assistance +2 (takes two people)  Press Photographer wheel walker;BSC  Range of Motion/Exercises Active  Activity Response Tolerated fair  Mobility visit 1 Mobility  Mobility Specialist Start Time (ACUTE ONLY) 1004  Mobility Specialist Stop Time (ACUTE ONLY) 1014  Mobility Specialist Time Calculation (min) (ACUTE ONLY) 10 min   Sitter requested assistance to transfer pt to/from [facility]. Assisted with transfer. Pt shaky throughout. At EOS returned to bed with all needs met. Call bell in reach and sitter at bedside.   Erminio Leos,  Mobility Specialist Can be reached via Secure Chat

## 2023-11-30 DIAGNOSIS — K921 Melena: Secondary | ICD-10-CM | POA: Diagnosis not present

## 2023-11-30 DIAGNOSIS — K922 Gastrointestinal hemorrhage, unspecified: Secondary | ICD-10-CM | POA: Diagnosis not present

## 2023-11-30 DIAGNOSIS — D62 Acute posthemorrhagic anemia: Secondary | ICD-10-CM | POA: Diagnosis not present

## 2023-11-30 DIAGNOSIS — K7031 Alcoholic cirrhosis of liver with ascites: Secondary | ICD-10-CM | POA: Diagnosis not present

## 2023-11-30 DIAGNOSIS — I10 Essential (primary) hypertension: Secondary | ICD-10-CM | POA: Diagnosis not present

## 2023-11-30 DIAGNOSIS — K7682 Hepatic encephalopathy: Secondary | ICD-10-CM | POA: Diagnosis not present

## 2023-11-30 LAB — CBC WITH DIFFERENTIAL/PLATELET
Abs Immature Granulocytes: 0.03 K/uL (ref 0.00–0.07)
Basophils Absolute: 0 K/uL (ref 0.0–0.1)
Basophils Relative: 0 %
Eosinophils Absolute: 0.2 K/uL (ref 0.0–0.5)
Eosinophils Relative: 3 %
HCT: 26.8 % — ABNORMAL LOW (ref 39.0–52.0)
Hemoglobin: 8.2 g/dL — ABNORMAL LOW (ref 13.0–17.0)
Immature Granulocytes: 0 %
Lymphocytes Relative: 26 %
Lymphs Abs: 1.8 K/uL (ref 0.7–4.0)
MCH: 24.3 pg — ABNORMAL LOW (ref 26.0–34.0)
MCHC: 30.6 g/dL (ref 30.0–36.0)
MCV: 79.5 fL — ABNORMAL LOW (ref 80.0–100.0)
Monocytes Absolute: 0.8 K/uL (ref 0.1–1.0)
Monocytes Relative: 12 %
Neutro Abs: 4 K/uL (ref 1.7–7.7)
Neutrophils Relative %: 59 %
Platelets: 56 K/uL — ABNORMAL LOW (ref 150–400)
RBC: 3.37 MIL/uL — ABNORMAL LOW (ref 4.22–5.81)
RDW: 22.6 % — ABNORMAL HIGH (ref 11.5–15.5)
Smear Review: NORMAL
WBC: 6.8 K/uL (ref 4.0–10.5)
nRBC: 0 % (ref 0.0–0.2)

## 2023-11-30 LAB — COMPREHENSIVE METABOLIC PANEL WITH GFR
ALT: 25 U/L (ref 0–44)
AST: 43 U/L — ABNORMAL HIGH (ref 15–41)
Albumin: 3.1 g/dL — ABNORMAL LOW (ref 3.5–5.0)
Alkaline Phosphatase: 112 U/L (ref 38–126)
Anion gap: 7 (ref 5–15)
BUN: 10 mg/dL (ref 8–23)
CO2: 21 mmol/L — ABNORMAL LOW (ref 22–32)
Calcium: 8.5 mg/dL — ABNORMAL LOW (ref 8.9–10.3)
Chloride: 109 mmol/L (ref 98–111)
Creatinine, Ser: 0.72 mg/dL (ref 0.61–1.24)
GFR, Estimated: >60 mL/min (ref >60)
Glucose, Bld: 111 mg/dL — ABNORMAL HIGH (ref 70–99)
Potassium: 3.4 mmol/L — ABNORMAL LOW (ref 3.5–5.1)
Sodium: 138 mmol/L (ref 135–145)
Total Bilirubin: 1.4 mg/dL — ABNORMAL HIGH (ref 0.0–1.2)
Total Protein: 5.8 g/dL — ABNORMAL LOW (ref 6.5–8.1)

## 2023-11-30 LAB — MAGNESIUM: Magnesium: 1.9 mg/dL (ref 1.7–2.4)

## 2023-11-30 LAB — AMMONIA: Ammonia: 88 umol/L — ABNORMAL HIGH (ref 9–35)

## 2023-11-30 MED ORDER — POTASSIUM CHLORIDE CRYS ER 20 MEQ PO TBCR
40.0000 meq | EXTENDED_RELEASE_TABLET | Freq: Once | ORAL | Status: AC
  Administered 2023-11-30: 40 meq via ORAL
  Filled 2023-11-30: qty 2

## 2023-11-30 NOTE — Progress Notes (Signed)
 PROGRESS NOTE    Robert Greene  FMW:[id number] DOB: 08/20/1960 DOA: 11/25/2023 PCP: [name], MD    Chief Complaint  Patient presents with   GI Bleeding    Brief Narrative:  Robert Greene is a 63 y.o. male with medical history significant for alcoholism, decompensated cirrhosis, rheumatoid arthritis, interstitial lung disease, and hypertension who presents with rectal bleeding and nausea with vomiting.    Assessment & Plan:   Principal Problem:   Acute GI bleeding Active Problems:   ILD (interstitial lung disease) (HCC)   Essential hypertension   Anemia   Cirrhosis of liver with ascites (HCC)   Rheumatoid arthritis (HCC)   Thrombocytopenia   Hypertensive urgency   Melena   Bleeding esophageal varices (HCC)   Encephalopathy, hepatic (HCC)   Acute blood loss anemia   Coffee ground emesis  1 chronic alcohol abuse/alcohol withdrawal, POA -Patient noted with waxing and waning mental status with some improvement with somnolence earlier on.  - Patient more alert today, following commands.   - Tremors improved.  - Continue CIWA protocol, Librium  detox protocol, thiamine , multivitamin, folic acid . -Ammonia levels fluctuating and currently at 88. - Lactulose  dose increased to 3 times daily per GI.   - Supportive care.   2.  Anemia/concern for acute GI bleed - Was on IV PPI, IV Rocephin , IV octreotide . - GI following due to patient's mental status unable to obtain consent initially and consent obtained from patient's family. - Patient status post EGD 11/28/2023 which showed grade 1 esophageal varices, portal hypertensive gastropathy which per GI believes were the cause of patient's coffee-ground emesis and melena.  Duodenal deformity, submucosal polyploid lesion soft and looks benign previously seen and unchanged per GI.   - GI recommended a regular diet which patient has been placed on, discontinuation of octreotide  which was done, PPI changed to Protonix  40 mg p.o.  daily and lactulose  increased to 20 g 3 times daily.   - GI recommending continuation of IV Rocephin  for another 24 hours and could potentially discontinue tomorrow.   - Continuation of carvedilol .  - Follow H&H. - Transfusion threshold hemoglobin < 7. - GI following and appreciate input and recommendations.  3.  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 - Continue to Ativan  withdrawal protocol, Librium  detox protocol. - Continue lactulose . - GI following.  4.  Thrombocytopenia -Likely secondary to chronic alcohol use and cirrhosis. - Patient with no overt bleeding. -Platelet count seems to be stabilizing at 56K. - Transfusion threshold platelet count <20K or active bleeding.  5.  Rheumatoid arthritis; ILD -Noted to be managed on Enbrel and Plaquenil . - Plaquenil .   6. hypertensive urgency, resolved -Noted in the setting of withdrawal, agitation, symptomatic anemia. - Resolved.  7.?  Hepatic encephalopathy -Patient more alert today.  Follows some commands.  Ammonia levels elevated.- -Continue lactulose  3 times daily.  8.  Hypokalemia -Replete.   DVT prophylaxis: SCDs Code Status: Full Family Communication: Updated patient.  No family at bedside. Disposition: TBD  Status is: Inpatient Remains inpatient appropriate because: Severity of illness   Consultants:  GI: Dr. Leigh 11/25/2023  Procedures:  Chest x-ray 11/25/2023 Upper endoscopy per GI: Dr. Avram 11/28/2023  Antimicrobials:  Anti-infectives (From admission, onward)    Start     Dose/Rate Route Frequency Ordered Stop   11/26/23 0200  cefTRIAXone  (ROCEPHIN ) 1 g in sodium chloride  0.9 % 100 mL IVPB  1 g 200 mL/hr over 30 Minutes Intravenous Every 24 hours 11/25/23 0416     11/25/23 1000  hydroxychloroquine  (PLAQUENIL ) tablet 200 mg        200 mg Oral Daily 11/25/23 0416     11/25/23 0200  cefTRIAXone  (ROCEPHIN )  1 g in sodium chloride  0.9 % 100 mL IVPB        1 g 200 mL/hr over 30 Minutes Intravenous  Once 11/25/23 0147 11/25/23 0301         Subjective: Sitting up in bed.  More alert today.  Denies any chest pain or shortness of breath.  No abdominal pain.  Having bowel movements.  Asking whether he can take a shower.  Tolerating diet.  Objective: Vitals:   11/29/23 1336 11/29/23 1955 11/30/23 0415 11/30/23 1154  BP: 136/71 127/81 128/82 (!) 144/93  Pulse: 72 70 67 76  Resp: 20 16 16 20   Temp: 98.2 F (36.8 C) 98.4 F (36.9 C) 98.2 F (36.8 C) 98 F (36.7 C)  TempSrc: Oral Oral Oral Oral  SpO2: 97% 100% 99% 99%  Weight:      Height:        Intake/Output Summary (Last 24 hours) at 11/30/2023 1309 Last data filed at 11/30/2023 0933 Gross per 24 hour  Intake 340 ml  Output 1250 ml  Net -910 ml   Filed Weights   11/25/23 1255  Weight: 91.9 kg    Examination:  General exam: Alert.  Decreased tremors. Respiratory system: Lungs clear to auscultation bilaterally anterior lung fields.  No wheezes, no crackles, no rhonchi.  No use of accessory muscles of respiration.  Normal respiratory effort.   Cardiovascular system: RRR no murmurs rubs or gallops.  No JVD.  No lower extremity edema. Gastrointestinal system: Abdomen is soft, nontender, nondistended, positive bowel sounds.  No rebound.  No guarding.  Central nervous system:  More alert. Moving extremities spontaneously.. No focal neurological deficits. Extremities: Symmetric 5 x 5 power. Skin: No rashes, lesions or ulcers Psychiatry: Judgement and insight poor to fair. Mood & affect appropriate.     Data Reviewed: I have personally reviewed following labs and imaging studies  CBC: Recent Labs  Lab 11/25/23 0136 11/25/23 0429 11/27/23 0424 11/28/23 0429 11/29/23 0431 11/29/23 1353 11/30/23 0406  WBC 4.9   < > 5.6 6.5 6.3 7.8 6.8  NEUTROABS 3.3  --   --   --   --   --  4.0  HGB 9.0*   < > 8.6* 8.5* 7.6* 7.4* 8.2*  HCT  29.5*   < > 29.1* 29.2* 25.7* 25.2* 26.8*  MCV 76.4*   < > 79.1* 79.8* 81.1 80.0 79.5*  PLT 47*   < > 55* 60* 54* 55* 56*   < > = values in this interval not displayed.    Basic Metabolic Panel: Recent Labs  Lab 11/25/23 0429 11/26/23 0358 11/27/23 0424 11/28/23 0429 11/29/23 0431 11/30/23 0406  NA 136 138 140 140 139 138  K 3.4* 3.5 3.5 3.2* 3.6 3.4*  CL 105 106 108 110 111 109  CO2 22 21* 21* 21* 20* 21*  GLUCOSE 100* 87 106* 98 151* 111*  BUN 12 11 12 14 13 10   CREATININE 0.72 0.87 0.95 0.96 0.83 0.72  CALCIUM  8.9 8.7* 9.2 8.5* 8.4* 8.5*  MG 1.8  --   --  2.0 2.1 1.9    GFR: Estimated Creatinine Clearance: 105.9 mL/min (by C-G formula based on SCr of 0.72 mg/dL).  Liver Function Tests:  Recent Labs  Lab 11/26/23 0358 11/27/23 0424 11/28/23 0429 11/29/23 0431 11/30/23 0406  AST 84* 84* 60* 46* 43*  ALT 30 36 30 23 25   ALKPHOS 110 121 110 106 112  BILITOT 2.1* 2.5* 2.3* 1.8* 1.4*  PROT 6.3* 6.8 6.3* 5.9* 5.8*  ALBUMIN  3.4* 3.6 3.3* 3.1* 3.1*    CBG: Recent Labs  Lab 11/26/23 0714  GLUCAP 105*     No results found for this or any previous visit (from the past 240 hours).       Radiology Studies: No results found.      Scheduled Meds:  sodium chloride    Intravenous Once   carvedilol   3.125 mg Oral BID WC   feeding supplement  1 Container Oral TID BM   folic acid   1 mg Oral Daily   hydroxychloroquine   200 mg Oral Daily   lactulose   20 g Oral TID   losartan   100 mg Oral Daily   multivitamin with minerals  1 tablet Oral Daily   pantoprazole   40 mg Oral QAC breakfast   potassium chloride   40 mEq Oral Once   sodium chloride  flush  3 mL Intravenous Q12H   thiamine   100 mg Oral Daily   Or   thiamine   100 mg Intravenous Daily   Continuous Infusions:  cefTRIAXone  (ROCEPHIN )  IV 1 g (11/30/23 0122)     LOS: 5 days    Time spent: 40 minutes    Toribio Hummer, MD [facility]   To contact the attending provider between 7A-7P or  the covering provider during after hours 7P-7A, please log into the web site [email or web address] and access using universal [place] password for that web site. If you do not have the password, please call the hospital operator.  11/30/2023, 1:09 PM

## 2023-11-30 NOTE — Progress Notes (Addendum)
 [facility] Progress Note  CC:  GI bleed, cirrhosis   Subjective:  More awake today but says that he is still tired.  Sometime speaks more clearly and other times he is mumbling.  Looks like he had 2 BMs that were documented yesterday.  Objective:  Vital signs in last 24 hours: Temp:  [98.2 F (36.8 C)-98.4 F (36.9 C)] 98.2 F (36.8 C) (11/07 0415) Pulse Rate:  [67-79] 67 (11/07 0415) Resp:  [16-20] 16 (11/07 0415) BP: (127-139)/(71-82) 128/82 (11/07 0415) SpO2:  [97 %-100 %] 99 % (11/07 0415) Last BM Date : 11/28/23 General:  Alert, sleepy but carries conversation well, in NAD Heart:  Regular rate and rhythm; no murmurs Pulm:  CTAB.  No W/R/R. Abdomen:  Soft, non-distended.  BS present.  Non-tender. Extremities:  SCDs in place. Neurologic:  Alert and oriented x 4;  grossly normal neurologically.  Does not appear to have asterixis on exam today.  Intake/Output from previous day: 11/06 0701 - 11/07 0700 In: 360 [P.O.:360] Out: 1250 [Urine:1250] Intake/Output this shift: Total I/O In: 220 [P.O.:220] Out: -   Lab Results: Recent Labs    11/29/23 0431 11/29/23 1353 11/30/23 0406  WBC 6.3 7.8 6.8  HGB 7.6* 7.4* 8.2*  HCT 25.7* 25.2* 26.8*  PLT 54* 55* 56*   BMET Recent Labs    11/28/23 0429 11/29/23 0431 11/30/23 0406  NA 140 139 138  K 3.2* 3.6 3.4*  CL 110 111 109  CO2 21* 20* 21*  GLUCOSE 98 151* 111*  BUN 14 13 10   CREATININE 0.96 0.83 0.72  CALCIUM  8.5* 8.4* 8.5*   LFT Recent Labs    11/30/23 0406  PROT 5.8*  ALBUMIN  3.1*  AST 43*  ALT 25  ALKPHOS 112  BILITOT 1.4*   PT/INR Recent Labs    11/28/23 0429  LABPROT 17.8*  INR 1.4*   Assessment / Plan: 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 MELD 3.0: 11. Admitted with dark stools and suspected hematemesis concerning for UGI  bleed. On Protonix  IV and Octreotide  infusion. On  Ceftriaxone . AST 84 -> 60. ALT 30. Alk phos 110. T. Bili 2.1 -> 2.5 -> 2.3. Direct bili 1.3. Hg 8.6 -> 8.5. EGD cancelled 11/3 and 11/4 due to somnolence/alcohol withdrawal on Lorazepam  and Librium .  EGD 11/5:  - Grade I esophageal varices. Not a cuse of bleeding - Portal hypertensive gastropathy. Suspect this was cause of coffee ground emesis and melena - Duodenal deformity. Submucosal polypoid lesion - soft and looks benign. Previously seen and unchanged. Looks innocent - The examination was otherwise normal. - No specimens collected. - Continue Carvedilol  3.125 mg bid. ? If we will be able to titrate it up. - Continue Pantoprazole  40 mg daily. - Continue Ceftriaxone  1 g IV for infection prophylaxis in setting of patient with cirrhosis and GI bleed.  This is day 5. - Transfuse for  Hg level < 7 - Nursing staff to document BM color and frequency, document any bloody or black stools.    Acute on chronic anemia. Hg 9.0 -> 7.8 -> 8.5 -> 7.5 -> 7.9 -> Hg 7.5 -> Hg 8.6 -> today Hg 8.5->7.6->8.2 grams today, no sign of bleeding. -Continue to monitor patient for active GI bleeding   Alcohol use disorder. Last alcohol consumption was Friday 10/31.  On Lorazepam  and Librax per CIWA protocol.  - CIWA protocol in process  - Continue multivitamin once daily, Folate  1mg   once daily  and Thiamine  100mg  po once daily    Encephalopathy, possible HE + alcohol withdrawal/sedative effects from Ativan  per CIWA protocol. Ammonia 87 -> 85 -> 91->71-> 88.  Had two BMs yesterday. 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 No splenomegaly per CTAP 09/2023. PLT 47 -> 39 -> transfused one unit of platelets 11/2 -> PLT 56 -> 43 -> 48 -> 42 -> 55->60->54->56 K.    Coagulopathy secondary to cirrhosis. INR 1.3 -> 1.4.    Hypokalemia. K+ 3.4 today.     History of colon polyps.  Colonoscopy 08/03/2023 identified multiple 1 to 5 mm polyps in the rectosigmoid colon, sigmoid and descending colon left in situ secondary to active GI bleed at that time. - Eventual future flexible sigmoidoscopy as an outpatient for removal of colon polyps   Rheumatoid arthritis on Plaquenil     LOS: 5 days   Jessica D. Zehr  11/30/2023, 10:20 AM    [facility] Attending   I have taken an interval history, reviewed the chart and examined the patient. I agree with the Advanced Practitioner's note, impression and recommendations as written.  The majority of the medical decision making was performed by me.  Here are my additions to the note:  He is improving.  We can discontinue ceftriaxone  tomorrow.  Continue supportive care.  Do not need to follow ammonia as it will remain elevated follow mental status, I did not find asterixis today.  Ambulate patient increase activity.  [name], MD, [facility] See TRACEY on call - gastroenterology for best contact person 11/30/2023 6:47 PM

## 2023-12-01 DIAGNOSIS — K920 Hematemesis: Secondary | ICD-10-CM

## 2023-12-01 DIAGNOSIS — K7682 Hepatic encephalopathy: Secondary | ICD-10-CM | POA: Diagnosis not present

## 2023-12-01 DIAGNOSIS — K921 Melena: Secondary | ICD-10-CM | POA: Diagnosis not present

## 2023-12-01 DIAGNOSIS — K7031 Alcoholic cirrhosis of liver with ascites: Secondary | ICD-10-CM | POA: Diagnosis not present

## 2023-12-01 DIAGNOSIS — K922 Gastrointestinal hemorrhage, unspecified: Secondary | ICD-10-CM | POA: Diagnosis not present

## 2023-12-01 DIAGNOSIS — D62 Acute posthemorrhagic anemia: Secondary | ICD-10-CM | POA: Diagnosis not present

## 2023-12-01 LAB — COMPREHENSIVE METABOLIC PANEL WITH GFR
ALT: 21 U/L (ref 0–44)
AST: 34 U/L (ref 15–41)
Albumin: 2.9 g/dL — ABNORMAL LOW (ref 3.5–5.0)
Alkaline Phosphatase: 110 U/L (ref 38–126)
Anion gap: 8 (ref 5–15)
BUN: 10 mg/dL (ref 8–23)
CO2: 20 mmol/L — ABNORMAL LOW (ref 22–32)
Calcium: 8.4 mg/dL — ABNORMAL LOW (ref 8.9–10.3)
Chloride: 111 mmol/L (ref 98–111)
Creatinine, Ser: 0.66 mg/dL (ref 0.61–1.24)
GFR, Estimated: >60 mL/min (ref >60)
Glucose, Bld: 119 mg/dL — ABNORMAL HIGH (ref 70–99)
Potassium: 3.4 mmol/L — ABNORMAL LOW (ref 3.5–5.1)
Sodium: 138 mmol/L (ref 135–145)
Total Bilirubin: 1.4 mg/dL — ABNORMAL HIGH (ref 0.0–1.2)
Total Protein: 5.5 g/dL — ABNORMAL LOW (ref 6.5–8.1)

## 2023-12-01 LAB — CBC
HCT: 26.2 % — ABNORMAL LOW (ref 39.0–52.0)
Hemoglobin: 8 g/dL — ABNORMAL LOW (ref 13.0–17.0)
MCH: 24.5 pg — ABNORMAL LOW (ref 26.0–34.0)
MCHC: 30.5 g/dL (ref 30.0–36.0)
MCV: 80.1 fL (ref 80.0–100.0)
Platelets: 60 K/uL — ABNORMAL LOW (ref 150–400)
RBC: 3.27 MIL/uL — ABNORMAL LOW (ref 4.22–5.81)
RDW: 22.5 % — ABNORMAL HIGH (ref 11.5–15.5)
WBC: 4.8 K/uL (ref 4.0–10.5)
nRBC: 0 % (ref 0.0–0.2)

## 2023-12-01 LAB — MAGNESIUM: Magnesium: 2 mg/dL (ref 1.7–2.4)

## 2023-12-01 MED ORDER — LACTULOSE 10 GM/15ML PO SOLN
20.0000 g | Freq: Two times a day (BID) | ORAL | Status: DC
Start: 2023-12-01 — End: 2023-12-03
  Administered 2023-12-01 – 2023-12-03 (×4): 20 g via ORAL
  Filled 2023-12-01 (×4): qty 30

## 2023-12-01 MED ORDER — POTASSIUM CHLORIDE CRYS ER 10 MEQ PO TBCR
40.0000 meq | EXTENDED_RELEASE_TABLET | ORAL | Status: AC
  Administered 2023-12-01 (×2): 40 meq via ORAL
  Filled 2023-12-01 (×2): qty 4

## 2023-12-01 NOTE — Progress Notes (Addendum)
 Patient Name: Robert Greene Date of Encounter: 12/01/2023, 11:16 AM     Assessment and Plan  Decompensated alcohol cirrhosis with a history of esophageal varices and portal gastropathy admitted with hematemesis question melena  -EGD with flat varices portal gastropathy which I thought was because of coffee-ground emesis and melena  Discontinue ceftriaxone  that was begun for GI bleeding in the setting of cirrhosis  Continue carvedilol   Alcohol cessation.  He indicates desire to go to rehab.  Altered mental status which is improved, combination of treatment of alcohol withdrawal and hepatic encephalopathy  Continue CIWA protocol, continue lactulose  but will decrease to 20 g twice daily. He was not on lactulose  at admission.  Ammonia level was elevated but that does not necessarily indicate a need for lactulose .  I think we will continue for the time being and please can be followed up as an outpatient with [facility] where he is seen.  Diminutive distal colon polyp seen at colonoscopy July 2025-Dr. Mansouraty plans colonoscopy and polypectomy when patient stable  GI is signing off.  I will message my staff to arrange outpatient follow-up for this patient.    Subjective  More awake.  Says he fell out of bed sort of last night.  I do not see any documentation in the chart.  Claims he struck his left wrist and arm these look intact and nontender no signs of trauma.   Objective  BP 123/71   Pulse 77   Temp 98 F (36.7 C)   Resp 20   Ht 5' 9 (1.753 m)   Wt 91.9 kg   SpO2 98%   BMI 29.92 kg/m  As above regarding extremity Speaks more clearly still speaking slowly he is alert and oriented x 2.  No asterixis Lungs clear HeartS1-S2 no rubs or gallops Abdomen obese soft nontender Extremities trace lower extremity edema  Recent Labs  Lab 11/25/23 0136 11/25/23 0429 11/26/23 1104 11/27/23 0424 11/28/23 0429 11/29/23 0431 11/30/23 0406 12/01/23 0446   AST 76*   < >  --  84* 60* 46* 43* 34  ALT 33   < >  --  36 30 23 25 21   ALKPHOS 128*   < >  --  121 110 106 112 110  BILITOT 2.7*   < >  --  2.5* 2.3* 1.8* 1.4* 1.4*  PROT 7.2   < >  --  6.8 6.3* 5.9* 5.8* 5.5*  ALBUMIN  3.7   < >  --  3.6 3.3* 3.1* 3.1* 2.9*  INR 1.2  --  1.3*  --  1.4*  --   --   --    < > = values in this interval not displayed.    Recent Labs  Lab 11/29/23 1353 11/30/23 0406 12/01/23 0446  HGB 7.4* 8.2* 8.0*  HCT 25.2* 26.8* 26.2*  WBC 7.8 6.8 4.8  PLT 55* 56* 60*    Recent Labs  Lab 11/25/23 0429 11/26/23 0358 11/27/23 0424 11/28/23 0429 11/29/23 0431 11/30/23 0406 12/01/23 0446  NA 136   < > 140 140 139 138 138  K 3.4*   < > 3.5 3.2* 3.6 3.4* 3.4*  CL 105   < > 108 110 111 109 111  CO2 22   < > 21* 21* 20* 21* 20*  GLUCOSE 100*   < > 106* 98 151* 111* 119*  BUN 12   < > 12 14 13 10 10   CREATININE 0.72   < > 0.95 0.96  0.83 0.72 0.66  CALCIUM  8.9   < > 9.2 8.5* 8.4* 8.5* 8.4*  MG 1.8  --   --  2.0 2.1 1.9 2.0   < > = values in this interval not displayed.      [name], MD, [facility] See TRACEY on call - gastroenterology for best contact person 12/01/2023 11:16 AM

## 2023-12-01 NOTE — Progress Notes (Signed)
 PROGRESS NOTE    Robert Greene  FMW:[id number] DOB: [birth year] DOA: 11/25/2023 PCP: [name], MD    Chief Complaint  Patient presents with   GI Bleeding    Brief Narrative:  Robert Greene is a 63 y.o. male with medical history significant for alcoholism, decompensated cirrhosis, rheumatoid arthritis, interstitial lung disease, and hypertension who presents with rectal bleeding and nausea with vomiting.    Assessment & Plan:   Principal Problem:   Acute GI bleeding Active Problems:   ILD (interstitial lung disease) (HCC)   Essential hypertension   Anemia   Cirrhosis of liver with ascites (HCC)   Rheumatoid arthritis (HCC)   Thrombocytopenia   Hypertensive urgency   Melena   Bleeding esophageal varices (HCC)   Encephalopathy, hepatic (HCC)   Acute blood loss anemia   Coffee ground emesis  1 chronic alcohol abuse/alcohol withdrawal, POA -Patient noted with waxing and waning mental status with some improvement with somnolence earlier on.  - Patient improved clinically more alert and following commands.  -Tremors improved. - Continue CIWA protocol, Librium  detox protocol, thiamine , multivitamin, folic acid . -Ammonia levels fluctuating. - Lactulose  dose decreased to twice daily per GI.  - Supportive care.   2.  Anemia/concern for acute GI bleed - Was on IV PPI, IV Rocephin , IV octreotide . - GI following due to patient's mental status unable to obtain consent initially and consent obtained from patient's family. - Patient status post EGD 11/28/2023 which showed grade 1 esophageal varices, portal hypertensive gastropathy which per GI believes were the cause of patient's coffee-ground emesis and melena.  Duodenal deformity, submucosal polyploid lesion soft and looks benign previously seen and unchanged per GI.   - GI recommended a regular diet which patient has been placed on, discontinuation of octreotide  which was done, PPI changed to Protonix  40 mg p.o. daily and  lactulose  increased to 20 g 3 times daily.   - IV Rocephin  to be discontinued today per GI. -Continue carvedilol . - Follow H&H. - Transfusion threshold hemoglobin < 7. - GI following and appreciate input and recommendations.  3.  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 - Continue to Ativan  withdrawal protocol, Librium  detox protocol. - Continue lactulose . - GI following.  4.  Thrombocytopenia -Likely secondary to chronic alcohol use and cirrhosis. - Patient with no overt bleeding. -Platelet slowly trending up currently at 60K.  - Transfusion threshold platelet count <20K or active bleeding.  5.  Rheumatoid arthritis; ILD -Noted to be managed on Enbrel and Plaquenil . - Continue plaquenil .   6. hypertensive urgency, resolved -Noted in the setting of withdrawal, agitation, symptomatic anemia. - Resolved.  7.?  Hepatic encephalopathy -Patient more alert today.  Follows some commands.  Ammonia levels elevated.- -Lactulose  dose decreased to twice daily per GI.   8.  Hypokalemia - Potassium at 3.4.   - KCl 40 mEq p.o. every 4 hours x 2 doses.    DVT prophylaxis: SCDs Code Status: Full Family Communication: Updated patient.  No family at bedside. Disposition: TBD  Status is: Inpatient Remains inpatient appropriate because: Severity of illness   Consultants:  GI: Dr. Leigh 11/25/2023  Procedures:  Chest x-ray 11/25/2023 Upper endoscopy per GI: Dr. Avram 11/28/2023  Antimicrobials:  Anti-infectives (From admission, onward)    Start     Dose/Rate Route Frequency Ordered Stop   11/26/23 0200  cefTRIAXone  (ROCEPHIN ) 1 g in sodium chloride  0.9 % 100 mL IVPB  Status:  Discontinued        1 g 200 mL/hr over 30 Minutes Intravenous Every 24 hours 11/25/23 0416 12/01/23 1120   11/25/23 1000  hydroxychloroquine  (PLAQUENIL ) tablet 200 mg        200 mg Oral Daily 11/25/23 0416      11/25/23 0200  cefTRIAXone  (ROCEPHIN ) 1 g in sodium chloride  0.9 % 100 mL IVPB        1 g 200 mL/hr over 30 Minutes Intravenous  Once 11/25/23 0147 11/25/23 0301         Subjective: Patient sleeping but easily arousable.  Denies any chest pain or shortness of breath.  Denies any abdominal pain.  Tolerating current diet.  Stated he fell 2 to 3 days ago.   Objective: Vitals:   11/30/23 1154 11/30/23 2005 12/01/23 0459 12/01/23 0836  BP: (!) 144/93 120/77 (!) 103/51 123/71  Pulse: 76 68 69 77  Resp: 20 18 20    Temp: 98 F (36.7 C) 98.5 F (36.9 C) 98 F (36.7 C)   TempSrc: Oral Oral    SpO2: 99% 100% 98%   Weight:      Height:        Intake/Output Summary (Last 24 hours) at 12/01/2023 1207 Last data filed at 12/01/2023 0500 Gross per 24 hour  Intake 820 ml  Output 1850 ml  Net -1030 ml   Filed Weights   11/25/23 1255  Weight: 91.9 kg    Examination:  General exam: Alert.  Decreased tremors. Respiratory system: CTAB.  No wheezes, no crackles, no rhonchi.  Fair air movement.  Speaking in full sentences.  No use of accessory muscles of respiration.  Cardiovascular system: Regular rate rhythm no murmurs rubs or gallops.  No JVD.  No lower extremity edema.  Gastrointestinal system: Abdomen is soft, nontender, nondistended, positive bowel sounds.  No rebound.  No guarding.  Central nervous system:  More alert. Moving extremities spontaneously.. No focal neurological deficits. Extremities: Symmetric 5 x 5 power. Skin: No rashes, lesions or ulcers Psychiatry: Judgement and insight poor to fair. Mood & affect appropriate.     Data Reviewed: I have personally reviewed following labs and imaging studies  CBC: Recent Labs  Lab 11/25/23 0136 11/25/23 0429 11/28/23 0429 11/29/23 0431 11/29/23 1353 11/30/23 0406 12/01/23 0446  WBC 4.9   < > 6.5 6.3 7.8 6.8 4.8  NEUTROABS 3.3  --   --   --   --  4.0  --   HGB 9.0*   < > 8.5* 7.6* 7.4* 8.2* 8.0*  HCT 29.5*   < > 29.2*  25.7* 25.2* 26.8* 26.2*  MCV 76.4*   < > 79.8* 81.1 80.0 79.5* 80.1  PLT 47*   < > 60* 54* 55* 56* 60*   < > = values in this interval not displayed.    Basic Metabolic Panel: Recent Labs  Lab 11/25/23 0429 11/26/23 0358 11/27/23 0424 11/28/23 0429 11/29/23 0431 11/30/23 0406 12/01/23 0446  NA 136   < > 140 140 139 138 138  K 3.4*   < > 3.5 3.2* 3.6 3.4* 3.4*  CL 105   < > 108 110 111 109 111  CO2 22   < > 21* 21* 20* 21* 20*  GLUCOSE 100*   < > 106* 98 151* 111* 119*  BUN 12   < > 12 14 13 10 10   CREATININE 0.72   < > 0.95 0.96 0.83 0.72 0.66  CALCIUM  8.9   < >  9.2 8.5* 8.4* 8.5* 8.4*  MG 1.8  --   --  2.0 2.1 1.9 2.0   < > = values in this interval not displayed.    GFR: Estimated Creatinine Clearance: 105.9 mL/min (by C-G formula based on SCr of 0.66 mg/dL).  Liver Function Tests: Recent Labs  Lab 11/27/23 0424 11/28/23 0429 11/29/23 0431 11/30/23 0406 12/01/23 0446  AST 84* 60* 46* 43* 34  ALT 36 30 23 25 21   ALKPHOS 121 110 106 112 110  BILITOT 2.5* 2.3* 1.8* 1.4* 1.4*  PROT 6.8 6.3* 5.9* 5.8* 5.5*  ALBUMIN  3.6 3.3* 3.1* 3.1* 2.9*    CBG: Recent Labs  Lab 11/26/23 0714  GLUCAP 105*     No results found for this or any previous visit (from the past 240 hours).       Radiology Studies: No results found.      Scheduled Meds:  sodium chloride    Intravenous Once   carvedilol   3.125 mg Oral BID WC   feeding supplement  1 Container Oral TID BM   folic acid   1 mg Oral Daily   hydroxychloroquine   200 mg Oral Daily   lactulose   20 g Oral BID   losartan   100 mg Oral Daily   multivitamin with minerals  1 tablet Oral Daily   pantoprazole   40 mg Oral QAC breakfast   potassium chloride   40 mEq Oral Q4H   sodium chloride  flush  3 mL Intravenous Q12H   thiamine   100 mg Oral Daily   Or   thiamine   100 mg Intravenous Daily   Continuous Infusions:     LOS: 6 days    Time spent: 40 minutes    Toribio Hummer, MD [facility]   To contact the attending provider between 7A-7P or the covering provider during after hours 7P-7A, please log into the web site [email or web address] and access using universal [place] password for that web site. If you do not have the password, please call the hospital operator.  12/01/2023, 12:07 PM

## 2023-12-02 DIAGNOSIS — D62 Acute posthemorrhagic anemia: Secondary | ICD-10-CM | POA: Diagnosis not present

## 2023-12-02 DIAGNOSIS — K920 Hematemesis: Secondary | ICD-10-CM | POA: Diagnosis not present

## 2023-12-02 DIAGNOSIS — K7682 Hepatic encephalopathy: Secondary | ICD-10-CM | POA: Diagnosis not present

## 2023-12-02 DIAGNOSIS — K922 Gastrointestinal hemorrhage, unspecified: Secondary | ICD-10-CM | POA: Diagnosis not present

## 2023-12-02 LAB — COMPREHENSIVE METABOLIC PANEL WITH GFR
ALT: 21 U/L (ref 0–44)
AST: 34 U/L (ref 15–41)
Albumin: 3.1 g/dL — ABNORMAL LOW (ref 3.5–5.0)
Alkaline Phosphatase: 112 U/L (ref 38–126)
Anion gap: 6 (ref 5–15)
BUN: 9 mg/dL (ref 8–23)
CO2: 20 mmol/L — ABNORMAL LOW (ref 22–32)
Calcium: 8.8 mg/dL — ABNORMAL LOW (ref 8.9–10.3)
Chloride: 110 mmol/L (ref 98–111)
Creatinine, Ser: 0.69 mg/dL (ref 0.61–1.24)
GFR, Estimated: >60 mL/min (ref >60)
Glucose, Bld: 121 mg/dL — ABNORMAL HIGH (ref 70–99)
Potassium: 4 mmol/L (ref 3.5–5.1)
Sodium: 136 mmol/L (ref 135–145)
Total Bilirubin: 1.3 mg/dL — ABNORMAL HIGH (ref 0.0–1.2)
Total Protein: 6 g/dL — ABNORMAL LOW (ref 6.5–8.1)

## 2023-12-02 LAB — CBC
HCT: 28.2 % — ABNORMAL LOW (ref 39.0–52.0)
Hemoglobin: 8.1 g/dL — ABNORMAL LOW (ref 13.0–17.0)
MCH: 23.1 pg — ABNORMAL LOW (ref 26.0–34.0)
MCHC: 28.7 g/dL — ABNORMAL LOW (ref 30.0–36.0)
MCV: 80.3 fL (ref 80.0–100.0)
Platelets: 69 K/uL — ABNORMAL LOW (ref 150–400)
RBC: 3.51 MIL/uL — ABNORMAL LOW (ref 4.22–5.81)
RDW: 22.6 % — ABNORMAL HIGH (ref 11.5–15.5)
WBC: 5.3 K/uL (ref 4.0–10.5)
nRBC: 0 % (ref 0.0–0.2)

## 2023-12-02 NOTE — Evaluation (Signed)
 Occupational Therapy Evaluation Patient Details Name: Robert Greene MRN: [id number] DOB: February 24, 1960 Today's Date: 12/02/2023   History of Present Illness   Pt is a 63 y/o male presenting with rectal bleeding and nausea with vomiting with concern for GI bleed. EGD 11/5 showed esophageal varices, hypertensive gastropathy and duodenal deformity. Hospitalization complicated by etoh withdrawal. PMH: etoh abuse, cirrhosis, RA, ILD, HTN     Clinical Impressions PTA, pt lives alone and typically Modified Independent with ADLs, household IADLs and mobility using a rollator. Pt presents now with deficits in strength, endurance and standing balance. Pt requires Min A for bed mobility, in room mobility using RW w/ small steps noted. Pt requires Setup for UB ADL and Min-Mod A for LB ADLs. Shakiness noted in BLE with prolonged standing placing pt at increased risk for falls. Based on current deficits and limited support at DC, recommend continued inpatient follow up therapy, <3 hours/day at DC. However, pt reports goal to DC directly home- will continue to follow acutely to progress overall independence/safety with ADLs/mobility.     If plan is discharge home, recommend the following:   A little help with walking and/or transfers;A little help with bathing/dressing/bathroom;Assistance with cooking/housework;Help with stairs or ramp for entrance     Functional Status Assessment   Patient has had a recent decline in their functional status and demonstrates the ability to make significant improvements in function in a reasonable and predictable amount of time.     Equipment Recommendations   None recommended by OT     Recommendations for Other Services         Precautions/Restrictions   Precautions Precautions: Fall Restrictions Weight Bearing Restrictions Per Provider Order: No     Mobility Bed Mobility Overal bed mobility: Needs Assistance Bed Mobility: Supine to Sit      Supine to sit: Min assist, HOB elevated, Used rails          Transfers Overall transfer level: Needs assistance Equipment used: Rolling walker (2 wheels) Transfers: Sit to/from Stand Sit to Stand: Min assist                  Balance Overall balance assessment: Needs assistance, History of Falls Sitting-balance support: No upper extremity supported, Feet supported Sitting balance-Leahy Scale: Fair     Standing balance support: Bilateral upper extremity supported, During functional activity, No upper extremity supported Standing balance-Leahy Scale: Poor                             ADL either performed or assessed with clinical judgement   ADL Overall ADL's : Needs assistance/impaired Eating/Feeding: Set up   Grooming: Minimal assistance;Contact guard assist;Standing;Oral care Grooming Details (indicate cue type and reason): initial assist to position self standing at sink, ensure balance and cues to attend to tasks. Upper Body Bathing: Set up;Sitting   Lower Body Bathing: Minimal assistance;Sit to/from stand;Sitting/lateral leans   Upper Body Dressing : Set up;Sitting   Lower Body Dressing: Moderate assistance;Sit to/from stand;Sitting/lateral leans   Toilet Transfer: Minimal assistance;Ambulation;Rolling walker (2 wheels)   Toileting- Clothing Manipulation and Hygiene: Minimal assistance;Sit to/from stand;Sitting/lateral lean         General ADL Comments: Discussed initial fall prevention at home (using shower chair, LB dressing while seated)     Vision Ability to See in Adequate Light: 0 Adequate Patient Visual Report: No change from baseline Vision Assessment?: No apparent visual deficits     Perception  Praxis         Pertinent Vitals/Pain Pain Assessment Pain Assessment: No/denies pain     Extremity/Trunk Assessment Upper Extremity Assessment Upper Extremity Assessment: Generalized weakness;Right hand dominant   Lower  Extremity Assessment Lower Extremity Assessment: Defer to PT evaluation   Cervical / Trunk Assessment Cervical / Trunk Assessment: Normal   Communication Communication Communication: No apparent difficulties   Cognition Arousal: Alert Behavior During Therapy: WFL for tasks assessed/performed, Restless Cognition: Cognition impaired     Awareness: Intellectual awareness intact, Online awareness impaired   Attention impairment (select first level of impairment): Selective attention Executive functioning impairment (select all impairments): Reasoning                   Following commands: Impaired Following commands impaired: Follows multi-step commands with increased time     Cueing  General Comments   Cueing Techniques: Verbal cues;Gestural cues      Exercises     Shoulder Instructions      Home Living Family/patient expects to be discharged to:: Private residence Living Arrangements: Alone Available Help at Discharge: Family;Available PRN/intermittently Type of Home: House Home Access: Stairs to enter [employer] of Steps: 3 Entrance Stairs-Rails: Right Home Layout: One level     Bathroom Shower/Tub: Walk-in Pensions Consultant: Standard Bathroom Accessibility: Yes How Accessible: Accessible via walker Home Equipment: Educational Psychologist (4 wheels);Cane - single point;Grab bars - tub/shower          Prior Functioning/Environment Prior Level of Function : Independent/Modified Independent;History of Falls (last six months);Driving             Mobility Comments: using rollator more consistently for mobility lately. reports difficulty walking for prolonged time due to fatigue ADLs Comments: Indep with ADLs, IADLs in the home. using shower chair    OT Problem List: Decreased strength;Decreased activity tolerance;Impaired balance (sitting and/or standing);Decreased cognition;Decreased safety awareness   OT  Treatment/Interventions: Self-care/ADL training;Therapeutic exercise;Energy conservation;DME and/or AE instruction;Therapeutic activities;Patient/family education;Balance training      OT Goals(Current goals can be found in the care plan section)   Acute Rehab OT Goals Patient Stated Goal: regain strength, stay sober OT Goal Formulation: With patient Time For Goal Achievement: 12/16/23 Potential to Achieve Goals: Good ADL Goals Pt Will Perform Lower Body Bathing: with supervision;sit to/from stand;sitting/lateral leans Pt Will Perform Lower Body Dressing: with supervision;sit to/from stand;sitting/lateral leans Pt Will Transfer to Toilet: with supervision;ambulating Pt/caregiver will Perform Home Exercise Program: Increased strength;Both right and left upper extremity;With theraband;With Supervision;With written HEP provided Additional ADL Goal #1: Pt to verbalize at least 3 fall prevention strategies to implement at home   OT Frequency:  Min 2X/week    Co-evaluation              AM-PAC OT 6 Clicks Daily Activity     Outcome Measure Help from another person eating meals?: None Help from another person taking care of personal grooming?: A Little Help from another person toileting, which includes using toliet, bedpan, or urinal?: A Little Help from another person bathing (including washing, rinsing, drying)?: A Little Help from another person to put on and taking off regular upper body clothing?: A Little Help from another person to put on and taking off regular lower body clothing?: A Lot 6 Click Score: 18   End of Session Equipment Utilized During Treatment: Gait belt;Rolling walker (2 wheels) Nurse Communication: Mobility status  Activity Tolerance: Patient tolerated treatment well Patient left: in chair;with call bell/phone within reach;with  chair alarm set  OT Visit Diagnosis: Unsteadiness on feet (R26.81);Other abnormalities of gait and mobility (R26.89);Muscle  weakness (generalized) (M62.81);History of falling (Z91.81)                Time: 9094-9066 OT Time Calculation (min): 28 min Charges:  OT General Charges $OT Visit: 1 Visit OT Evaluation $OT Eval Moderate Complexity: 1 Mod OT Treatments $Self Care/Home Management : 8-22 mins  Mliss NOVAK, OTR/L Acute Rehab Services Office: [phone]   Mliss Fish 12/02/2023, 11:51 AM

## 2023-12-02 NOTE — Progress Notes (Signed)
 PROGRESS NOTE    Robert Greene  FMW:[id number] DOB: 10/17/1960 DOA: 11/25/2023 PCP: [name], MD    Chief Complaint  Patient presents with   GI Bleeding    Brief Narrative:  Robert Greene is a 63 y.o. male with medical history significant for alcoholism, decompensated cirrhosis, rheumatoid arthritis, interstitial lung disease, and hypertension who presents with rectal bleeding and nausea with vomiting.    Assessment & Plan:   Principal Problem:   Acute GI bleeding Active Problems:   ILD (interstitial lung disease) (HCC)   Essential hypertension   Anemia   Cirrhosis of liver with ascites (HCC)   Rheumatoid arthritis (HCC)   Thrombocytopenia   Hypertensive urgency   Melena   Bleeding esophageal varices (HCC)   Encephalopathy, hepatic (HCC)   Acute blood loss anemia   Coffee ground emesis  1 chronic alcohol abuse/alcohol withdrawal, POA -Patient noted with waxing and waning mental status with some improvement with somnolence earlier on.  -Significant clinical improvement, more alert, following commands. -Tremors improved. -Likely at baseline. - Continue CIWA protocol, thiamine , multivitamin, folic acid . -Noted to have completed Librium  detox protocol. - Lactulose  dose decreased to twice daily per GI.  - Supportive care.   2.  Anemia/concern for acute GI bleed - Was on IV PPI, IV Rocephin , IV octreotide . - GI was following due to patient's mental status unable to obtain consent initially and consent obtained from patient's family. - Patient status post EGD 11/28/2023 which showed grade 1 esophageal varices, portal hypertensive gastropathy which per GI believes were the cause of patient's coffee-ground emesis and melena.  Duodenal deformity, submucosal polyploid lesion soft and looks benign previously seen and unchanged per GI.   - GI recommended a regular diet which patient has been placed on and tolerating, discontinuation of octreotide  which was done, PPI  changed to Protonix  40 mg p.o. daily and lactulose  increased to 20 g 3 times daily initially and has been decreased back down to twice daily..   - Status post IV Rocephin .   -Continue carvedilol . - Follow H&H. - Transfusion threshold hemoglobin < 7. - GI following and appreciate input and recommendations.  3.  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 - Continue to Ativan  withdrawal protocol. - Completed Librium  detox protocol. - Continue lactulose  twice daily. - GI was following but has signed off.   4.  Thrombocytopenia -Likely secondary to chronic alcohol use and cirrhosis. - Patient with no overt bleeding. - Platelet count slowly improving currently at 69K.  - Transfusion threshold platelet count <20K or active bleeding.  5.  Rheumatoid arthritis; ILD -Noted to be managed on Enbrel and Plaquenil . - Plaquenil .   6. hypertensive urgency, resolved -Noted in the setting of withdrawal, agitation, symptomatic anemia. - Resolved.  7.?  Hepatic encephalopathy -Patient more alert today.  Likely at baseline.  - Following commands.  - Continue lactulose  twice daily.    8.  Hypokalemia - Repleted.   - Potassium of 4.0.    DVT prophylaxis: SCDs Code Status: Full Family Communication: Updated patient.  No family at bedside. Disposition: Likely home with home health in the next 24 to 48 hours.   Status is: Inpatient Remains inpatient appropriate because: Severity of illness   Consultants:  GI: Dr. Leigh 11/25/2023  Procedures:  Chest x-ray 11/25/2023 Upper endoscopy per GI: Dr. Avram 11/28/2023  Antimicrobials:  Anti-infectives (From admission, onward)    Start  Dose/Rate Route Frequency Ordered Stop   11/26/23 0200  cefTRIAXone  (ROCEPHIN ) 1 g in sodium chloride  0.9 % 100 mL IVPB  Status:  Discontinued        1 g 200 mL/hr over 30 Minutes Intravenous Every 24 hours 11/25/23  0416 12/01/23 1120   11/25/23 1000  hydroxychloroquine  (PLAQUENIL ) tablet 200 mg        200 mg Oral Daily 11/25/23 0416     11/25/23 0200  cefTRIAXone  (ROCEPHIN ) 1 g in sodium chloride  0.9 % 100 mL IVPB        1 g 200 mL/hr over 30 Minutes Intravenous  Once 11/25/23 0147 11/25/23 0301         Subjective: Patient laying in bed.  Alert.  Following commands appropriately.  Denies any chest pain or shortness of breath.  No abdominal pain.  Tolerated diet.  Stated ambulated in hallway with therapy with a walker today.    Objective: Vitals:   12/01/23 0836 12/01/23 1658 12/01/23 2110 12/02/23 0451  BP: 123/71 131/72 118/72 133/78  Pulse: 77 67 63 70  Resp:   16 16  Temp:   98.8 F (37.1 C) 99.1 F (37.3 C)  TempSrc:   Oral Oral  SpO2:   100% 100%  Weight:      Height:        Intake/Output Summary (Last 24 hours) at 12/02/2023 1332 Last data filed at 12/02/2023 0850 Gross per 24 hour  Intake 220 ml  Output 1000 ml  Net -780 ml   Filed Weights   11/25/23 1255  Weight: 91.9 kg    Examination:  General exam: Alert.  No significant tremors noted. Respiratory system: Lungs clear to auscultation bilaterally.  No wheezes, no crackles, no rhonchi.  Fair air movement.  Speaking in full sentences.  No use of accessory muscles of respiration.  Cardiovascular system: RRR no murmurs rubs or gallops.  No JVD.  No lower extremity edema.  Gastrointestinal system: Abdomen soft, nontender, nondistended, positive bowel sounds.  No rebound.  No guarding. Central nervous system: Alert.  Moving extremities spontaneously.  Following commands appropriately.  No focal neurological deficits.  Extremities: Symmetric 5 x 5 power. Skin: No rashes, lesions or ulcers Psychiatry: Judgement and insight normal. Mood & affect appropriate.     Data Reviewed: I have personally reviewed following labs and imaging studies  CBC: Recent Labs  Lab 11/29/23 0431 11/29/23 1353 11/30/23 0406 12/01/23 0446  12/02/23 0436  WBC 6.3 7.8 6.8 4.8 5.3  NEUTROABS  --   --  4.0  --   --   HGB 7.6* 7.4* 8.2* 8.0* 8.1*  HCT 25.7* 25.2* 26.8* 26.2* 28.2*  MCV 81.1 80.0 79.5* 80.1 80.3  PLT 54* 55* 56* 60* 69*    Basic Metabolic Panel: Recent Labs  Lab 11/28/23 0429 11/29/23 0431 11/30/23 0406 12/01/23 0446 12/02/23 0436  NA 140 139 138 138 136  K 3.2* 3.6 3.4* 3.4* 4.0  CL 110 111 109 111 110  CO2 21* 20* 21* 20* 20*  GLUCOSE 98 151* 111* 119* 121*  BUN 14 13 10 10 9   CREATININE 0.96 0.83 0.72 0.66 0.69  CALCIUM  8.5* 8.4* 8.5* 8.4* 8.8*  MG 2.0 2.1 1.9 2.0  --     GFR: Estimated Creatinine Clearance: 105.9 mL/min (by C-G formula based on SCr of 0.69 mg/dL).  Liver Function Tests: Recent Labs  Lab 11/28/23 0429 11/29/23 0431 11/30/23 0406 12/01/23 0446 12/02/23 0436  AST 60* 46* 43* 34 34  ALT  30 23 25 21 21   ALKPHOS 110 106 112 110 112  BILITOT 2.3* 1.8* 1.4* 1.4* 1.3*  PROT 6.3* 5.9* 5.8* 5.5* 6.0*  ALBUMIN  3.3* 3.1* 3.1* 2.9* 3.1*    CBG: Recent Labs  Lab 11/26/23 0714  GLUCAP 105*     No results found for this or any previous visit (from the past 240 hours).       Radiology Studies: No results found.      Scheduled Meds:  sodium chloride    Intravenous Once   carvedilol   3.125 mg Oral BID WC   feeding supplement  1 Container Oral TID BM   folic acid   1 mg Oral Daily   hydroxychloroquine   200 mg Oral Daily   lactulose   20 g Oral BID   losartan   100 mg Oral Daily   multivitamin with minerals  1 tablet Oral Daily   pantoprazole   40 mg Oral QAC breakfast   sodium chloride  flush  3 mL Intravenous Q12H   thiamine   100 mg Oral Daily   Or   thiamine   100 mg Intravenous Daily   Continuous Infusions:     LOS: 7 days    Time spent: 40 minutes    Toribio Hummer, MD [facility]   To contact the attending provider between 7A-7P or the covering provider during after hours 7P-7A, please log into the web site [email or web address] and access using  universal [place] password for that web site. If you do not have the password, please call the hospital operator.  12/02/2023, 1:32 PM

## 2023-12-02 NOTE — Evaluation (Signed)
 Physical Therapy Evaluation Patient Details Name: Robert Greene MRN: [id number] DOB: 03-05-1960 Today's Date: 12/02/2023  History of Present Illness  Pt is a 63 y/o male presenting with rectal bleeding and nausea with vomiting with concern for GI bleed. EGD 11/5 showed esophageal varices, hypertensive gastropathy and duodenal deformity. Hospitalization complicated by etoh withdrawal. PMH: etoh abuse, cirrhosis, RA, ILD, HTN  Clinical Impression  Pt admitted with above diagnosis.  Pt is pleasant and cooperative. Reports independence at baseline, fatigues easily.  Pt amb ~ 120' with RW and min/CGA. Anticipate steady progress, will benefit from HHPT  Pt currently with functional limitations due to the deficits listed below (see PT Problem List). Pt will benefit from acute skilled PT to increase their independence and safety with mobility to allow discharge.           If plan is discharge home, recommend the following: Help with stairs or ramp for entrance;Assist for transportation   Can travel by private vehicle        Equipment Recommendations Rolling walker (2 wheels)  Recommendations for Other Services       Functional Status Assessment Patient has had a recent decline in their functional status and demonstrates the ability to make significant improvements in function in a reasonable and predictable amount of time.     Precautions / Restrictions Precautions Precautions: Fall      Mobility  Bed Mobility   Bed Mobility: Supine to Sit, Sit to Supine     Supine to sit: Supervision, Used rails Sit to supine: Supervision   General bed mobility comments: incr time, supervision for safety    Transfers Overall transfer level: Needs assistance Equipment used: Rolling walker (2 wheels) Transfers: Sit to/from Stand Sit to Stand: Contact guard assist, Min assist           General transfer comment: cues for safety and hand placement    Ambulation/Gait                   Stairs            Wheelchair Mobility     Tilt Bed    Modified Rankin (Stroke Patients Only)       Balance Overall balance assessment: Needs assistance, History of Falls Sitting-balance support: Feet supported, No upper extremity supported Sitting balance-Leahy Scale: Fair     Standing balance support: During functional activity, Reliant on assistive device for balance Standing balance-Leahy Scale: Poor                               Pertinent Vitals/Pain Pain Assessment Pain Assessment: No/denies pain    Home Living Family/patient expects to be discharged to:: Private residence Living Arrangements: Alone Available Help at Discharge: Family;Available PRN/intermittently Type of Home: House Home Access: Stairs to enter Entrance Stairs-Rails: Right Entrance Stairs-Number of Steps: 3   Home Layout: One level Home Equipment: Educational Psychologist (4 wheels);Cane - single point;Grab bars - tub/shower      Prior Function Prior Level of Function : Independent/Modified Independent;History of Falls (last six months);Driving             Mobility Comments: using rollator more consistently for mobility lately. reports difficulty walking for prolonged time due to fatigue (pt reports it was getting better during his 6 wk period of sobriety but then he began drinking again) ADLs Comments: Indep with ADLs, IADLs in the home. using shower chair     Extremity/Trunk  Assessment   Upper Extremity Assessment Upper Extremity Assessment: Defer to OT evaluation    Lower Extremity Assessment Lower Extremity Assessment: Generalized weakness    Cervical / Trunk Assessment Cervical / Trunk Assessment: Normal  Communication   Communication Communication: No apparent difficulties    Cognition Arousal: Alert Behavior During Therapy: WFL for tasks assessed/performed   PT - Cognitive impairments: No apparent impairments                            Following commands impaired: Follows multi-step commands with increased time     Cueing Cueing Techniques: Verbal cues, Gestural cues     General Comments General comments (skin integrity, edema, etc.): recent fall at home d/t drinking (fell out of his back door and injured L hnad/wrist)    Exercises     Assessment/Plan    PT Assessment Patient needs continued PT services  PT Problem List Decreased strength;Decreased activity tolerance;Decreased balance;Decreased mobility       PT Treatment Interventions DME instruction;Therapeutic exercise;Gait training;Therapeutic activities;Patient/family education;Functional mobility training    PT Goals (Current goals can be found in the Care Plan section)  Acute Rehab PT Goals PT Goal Formulation: With patient Time For Goal Achievement: 12/16/23 Potential to Achieve Goals: Good    Frequency Min 3X/week     Co-evaluation               AM-PAC PT 6 Clicks Mobility  Outcome Measure Help needed turning from your back to your side while in a flat bed without using bedrails?: A Little Help needed moving from lying on your back to sitting on the side of a flat bed without using bedrails?: None Help needed moving to and from a bed to a chair (including a wheelchair)?: A Little Help needed standing up from a chair using your arms (e.g., wheelchair or bedside chair)?: A Little Help needed to walk in hospital room?: A Little Help needed climbing 3-5 steps with a railing? : A Lot 6 Click Score: 18    End of Session Equipment Utilized During Treatment: Gait belt Activity Tolerance: Patient tolerated treatment well Patient left: with call bell/phone within reach;in bed;with bed alarm set Nurse Communication: Mobility status PT Visit Diagnosis: Other abnormalities of gait and mobility (R26.89)    Time: 8775-8763 PT Time Calculation (min) (ACUTE ONLY): 12 min   Charges:   PT Evaluation $PT Eval Low Complexity: 1 Low   PT  General Charges $$ ACUTE PT VISIT: 1 Visit         Flecia Shutter, PT  Acute Rehab Dept [facility]) [phone]  12/02/2023   [facility] 12/02/2023, 1:41 PM

## 2023-12-03 ENCOUNTER — Other Ambulatory Visit (HOSPITAL_COMMUNITY): Payer: Self-pay

## 2023-12-03 DIAGNOSIS — D696 Thrombocytopenia, unspecified: Secondary | ICD-10-CM | POA: Diagnosis not present

## 2023-12-03 DIAGNOSIS — K7682 Hepatic encephalopathy: Secondary | ICD-10-CM | POA: Diagnosis not present

## 2023-12-03 DIAGNOSIS — K922 Gastrointestinal hemorrhage, unspecified: Secondary | ICD-10-CM | POA: Diagnosis not present

## 2023-12-03 DIAGNOSIS — D62 Acute posthemorrhagic anemia: Secondary | ICD-10-CM | POA: Diagnosis not present

## 2023-12-03 LAB — BASIC METABOLIC PANEL WITH GFR
Anion gap: 8 (ref 5–15)
BUN: 9 mg/dL (ref 8–23)
CO2: 20 mmol/L — ABNORMAL LOW (ref 22–32)
Calcium: 9.2 mg/dL (ref 8.9–10.3)
Chloride: 107 mmol/L (ref 98–111)
Creatinine, Ser: 0.77 mg/dL (ref 0.61–1.24)
GFR, Estimated: >60 mL/min (ref >60)
Glucose, Bld: 101 mg/dL — ABNORMAL HIGH (ref 70–99)
Potassium: 4.2 mmol/L (ref 3.5–5.1)
Sodium: 135 mmol/L (ref 135–145)

## 2023-12-03 LAB — CBC
HCT: 29.2 % — ABNORMAL LOW (ref 39.0–52.0)
Hemoglobin: 8.7 g/dL — ABNORMAL LOW (ref 13.0–17.0)
MCH: 23.5 pg — ABNORMAL LOW (ref 26.0–34.0)
MCHC: 29.8 g/dL — ABNORMAL LOW (ref 30.0–36.0)
MCV: 78.9 fL — ABNORMAL LOW (ref 80.0–100.0)
Platelets: 90 K/uL — ABNORMAL LOW (ref 150–400)
RBC: 3.7 MIL/uL — ABNORMAL LOW (ref 4.22–5.81)
RDW: 22.4 % — ABNORMAL HIGH (ref 11.5–15.5)
WBC: 6.8 K/uL (ref 4.0–10.5)
nRBC: 0 % (ref 0.0–0.2)

## 2023-12-03 LAB — MAGNESIUM: Magnesium: 1.9 mg/dL (ref 1.7–2.4)

## 2023-12-03 MED ORDER — LACTULOSE 10 GM/15ML PO SOLN
20.0000 g | Freq: Two times a day (BID) | ORAL | 1 refills | Status: DC
  Filled 2023-12-03: qty 946, 16d supply, fill #0

## 2023-12-03 MED ORDER — PANTOPRAZOLE SODIUM 40 MG PO TBEC
40.0000 mg | DELAYED_RELEASE_TABLET | Freq: Every day | ORAL | 0 refills | Status: DC
  Filled 2023-12-03: qty 90, 90d supply, fill #0

## 2023-12-03 MED ORDER — ORAL CARE MOUTH RINSE: 15.0000 mL | OROMUCOSAL | Status: DC | PRN

## 2023-12-03 NOTE — Progress Notes (Signed)
 Mobility Specialist - Progress Note   12/03/23 1137  Mobility  Activity Ambulated with assistance  Level of Assistance Contact guard assist, steadying assist  Assistive Device Front wheel walker  Distance Ambulated (ft) 160 ft  Range of Motion/Exercises Active  Activity Response Tolerated well  Mobility Referral Yes  Mobility visit 1 Mobility  Mobility Specialist Start Time (ACUTE ONLY) 1120  Mobility Specialist Stop Time (ACUTE ONLY) 1137  Mobility Specialist Time Calculation (min) (ACUTE ONLY) 17 min   Pt was found on recliner chair and agreeable to mobilize. No complaints. At EOS returned to recliner chair with all needs met. Call bell in reach and chair alarm on.   Erminio Leos,  Mobility Specialist Can be reached via Secure Chat

## 2023-12-03 NOTE — Progress Notes (Signed)
 Discharge medications delivered to patient at the bedside in a secure bag.

## 2023-12-03 NOTE — Discharge Summary (Signed)
 Physician Discharge Summary  Robert Greene FMW:[id number] DOB: 1960-10-13 DOA: 11/25/2023  PCP: [name], MD  Admit date: 11/25/2023 Discharge date: 12/03/2023  Time spent: 60 minutes  Recommendations for Outpatient Follow-up:  Follow-up with Dr. Wilhelmenia, GI.  Office will call to schedule appointment time. Follow-up with [name], MD in 2 weeks.  On follow-up patient will need a CBC done to follow-up on counts.  Patient will also need a comprehensive metabolic profile, magnesium  level done to follow-up on electrolytes, renal function, LFTs.   Discharge Diagnoses:  Principal Problem:   Acute GI bleeding Active Problems:   ILD (interstitial lung disease) (HCC)   Essential hypertension   Anemia   Cirrhosis of liver with ascites (HCC)   Rheumatoid arthritis (HCC)   Thrombocytopenia   Hypertensive urgency   Melena   Bleeding esophageal varices (HCC)   Encephalopathy, hepatic (HCC)   Acute blood loss anemia   Coffee ground emesis   Discharge Condition: Stable and improved.  Diet recommendation: Heart healthy  Filed Weights   11/25/23 1255  Weight: 91.9 kg    History of present illness:  HPI per Dr. Charlton Robert Greene is a 63 y.o. male with medical history significant for alcoholism, decompensated cirrhosis, rheumatoid arthritis, interstitial lung disease, and hypertension who presents with rectal bleeding and nausea with vomiting.   Patient reports 2 episodes of melena last night followed by numerous episodes of vomiting.  He denies any change in his chronic mild abdominal pain and did not see any blood in his vomit.  He has not been lightheaded or presyncopal and denies chest pain.  He continues to consume alcohol but has trouble quantifying.   ED Course: Upon arrival to the ED, patient is found to be afebrile and saturating well on room air with normal HR and elevated BP.  Labs are most notable for normal BUN, normal creatinine, bilirubin 2.7, hemoglobin 9.0,  platelets 47,000, and INR 1.2.   ED physician sent a secure chat to GI (Dr. Stacia) with request for a.m. consult.  80 mg IV Protonix  and 1 g IV Rocephin  were administered in the ED.  Hospital Course:  1 chronic alcohol abuse/alcohol withdrawal, POA -Patient noted with waxing and waning mental status with some improvement with somnolence earlier on.  -Patient was placed on Ativan  CIWA protocol, thiamine , multivitamin, folic acid . -Patient also placed subsequently on Librium  detox protocol which she completed. -Patient also treated for hepatic encephalopathy. -Patient improved clinically during the hospitalization, improved significantly such that by day of discharge he was more alert, following commands appropriately, engaging in conversation and back at baseline by day of discharge. -Alcohol cessation stressed to patient. -Patient was discharged in stable and improved condition with outpatient follow-up with PCP.   2.  Anemia/concern for acute GI bleed - Was on IV PPI, IV Rocephin , IV octreotide  initially during the hospitalization. - GI was following, due to patient's mental status unable to obtain consent initially and consent obtained from patient's family. - Patient status post EGD 11/28/2023 which showed grade 1 esophageal varices, portal hypertensive gastropathy which per GI believes were the cause of patient's coffee-ground emesis and melena.  Duodenal deformity, submucosal polyploid lesion soft and looks benign previously seen and unchanged per GI.   - GI recommended a regular diet which patient was placed on and tolerated, discontinuation of octreotide  which was done, PPI changed to Protonix  40 mg p.o. daily and lactulose  increased to 20 g 3 times daily initially and has been decreased back down  to twice daily..   - Status post IV Rocephin .   - Patient maintained on home regimen carvedilol .   - Patient improved hemoglobin stabilized at 8.7 by day of discharge.  -Outpatient follow-up  with GI.    3.  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 - Patient maintained on Ativan  withdrawal protocol. - Patient completed Librium  detox protocol during the hospitalization. - Patient also maintained on lactulose  initially increased to 3 times daily and subsequently decreased to twice daily per GI recommendations.  - Outpatient follow-up with GI.    4.  Thrombocytopenia -Likely secondary to chronic alcohol use and cirrhosis. - Patient with no overt bleeding. - Platelet count slowly improving and was up to 90K by day of discharge.   - Outpatient follow-up.    5.  Rheumatoid arthritis; ILD -Noted to be managed on Enbrel and Plaquenil . - Patient maintained on home regimen Plaquenil  during the hospitalization.    6. hypertensive urgency, resolved -Noted in the setting of withdrawal, agitation, symptomatic anemia. - Resolved.   7.?  Hepatic encephalopathy -Patient with clinical improvement during the hospitalization.   - Patient maintained on lactulose  initially 3 times daily and subsequently decreased to twice daily per GI recommendations.  -Outpatient follow-up with PCP and GI.    8.  Hypokalemia - Repleted during the hospitalization. - Outpatient follow-up with PCP.    Procedures: Chest x-ray 11/25/2023 Upper endoscopy per GI: Dr. Avram 11/28/2023  Consultations: GI: Dr. Leigh 11/25/2023   Discharge Exam: Vitals:   12/03/23 0445 12/03/23 1406  BP: 138/85 120/77  Pulse: 76 66  Resp: 16   Temp: 98 F (36.7 C) 99.1 F (37.3 C)  SpO2: 100% 100%    General: NAD Cardiovascular: RRR no murmurs rubs or gallops.  No JVD.  No lower extremity edema. Respiratory: Clear to auscultation bilaterally.  No wheezes, no crackles, no rhonchi.  Fair air movement.  Speaking in full sentences.  Discharge Instructions   Discharge Instructions     Diet - low sodium heart healthy    Complete by: As directed    Increase activity slowly   Complete by: As directed       Allergies as of 12/03/2023       Reactions   Bee Venom Anaphylaxis, Hives, Rash   Spider Antivenin [s Black Widow (latrodec Mactans) Antivenin] Anaphylaxis, Hives, Rash        Medication List     TAKE these medications    albuterol  108 (90 Base) MCG/ACT inhaler Commonly known as: VENTOLIN  HFA Inhale 1-2 puffs into the lungs every 6 (six) hours as needed for wheezing or shortness of breath.   B-1 100 MG Tabs Take 1 tablet by mouth daily.   carvedilol  3.125 MG tablet Commonly known as: COREG  Take 1 tablet (3.125 mg total) by mouth 2 (two) times daily with a meal.   EPINEPHrine  0.3 mg/0.3 mL Soaj injection Commonly known as: EPI-PEN Use once.  If ineffective, use 2nd dose.   etanercept 50 MG/ML injection Commonly known as: ENBREL Inject 50 mg into the skin every Saturday.   folic acid  1 MG tablet Commonly known as: FOLVITE  Take 1 tablet (1 mg total) by mouth daily.   furosemide  20 MG tablet Commonly known as: Lasix  Take 1 tablet (20 mg total) by mouth daily as needed for fluid or edema.   hydroxychloroquine  200 MG tablet Commonly known as: PLAQUENIL  Take 200 mg by mouth daily.  lactulose  10 GM/15ML solution Commonly known as: CHRONULAC  Take 30 mLs (20 g total) by mouth 2 (two) times daily. What changed:  when to take this reasons to take this   losartan  100 MG tablet Commonly known as: COZAAR  Take 100 mg by mouth daily.   multivitamin with minerals Tabs tablet Take 1 tablet by mouth daily.   pantoprazole  40 MG tablet Commonly known as: PROTONIX  Take 1 tablet (40 mg total) by mouth daily before breakfast. Start taking on: December 04, 2023 What changed: when to take this   rifaximin  550 MG Tabs tablet Commonly known as: XIFAXAN  Take 1 tablet (550 mg total) by mouth 2 (two) times daily.               Durable Medical Equipment  (From admission, onward)            Start     Ordered   12/02/23 1609  For home use only DME Walker rolling  Once       Question Answer Comment  Walker: With 5 Inch Wheels   Patient needs a walker to treat with the following condition Debility      12/02/23 1609           Allergies  Allergen Reactions   Bee Venom Anaphylaxis, Hives and Rash   Spider Antivenin [S Black Widow (Latrodec Mactans) Antivenin] Anaphylaxis, Hives and Rash    Follow-up Information     Mansouraty, Aloha Raddle., MD Follow up.   Specialties: Gastroenterology, Internal Medicine Why: Office will arrange follow-up and it will be noted in the chart versus contacting him after discharge Contact information: [address] [place] [address] [phone]         [name], MD. Schedule an appointment as soon as possible for a visit in 2 week(s).   Specialty: Family Medicine Contact information: [address] [place] [address] [phone]                  The results of significant diagnostics from this hospitalization (including imaging, microbiology, ancillary and laboratory) are listed below for reference.    Significant Diagnostic Studies: DG Chest Portable 1 View Result Date: 11/25/2023 CLINICAL DATA:  Vomiting with bloody stool. EXAM: PORTABLE CHEST 1 VIEW COMPARISON:  November 09, 2023 FINDINGS: The heart size and mediastinal contours are within normal limits. Mild areas of scarring and/or atelectasis are seen within the bilateral lung bases. No focal consolidation, pleural effusion or pneumothorax is identified. There is a chronic fracture deformity of the mid left clavicle. The visualized skeletal structures are otherwise unremarkable. IMPRESSION: Mild bibasilar scarring and/or atelectasis. Electronically Signed   By: Suzen Dials M.D.   On: 11/25/2023 01:55   CT Angio Chest PE W and/or Wo Contrast Result Date: 11/09/2023 EXAM: CTA CHEST 11/09/2023 11:21:54 PM TECHNIQUE: CTA of  the chest was performed without and with the administration of 75 mL iohexol  (OMNIPAQUE ) 350 MG/ML injection. Multiplanar reformatted images are provided for review. MIP images are provided for review. Automated exposure control, iterative reconstruction, and/or weight based adjustment of the mA/kV was utilized to reduce the radiation dose to as low as reasonably achievable. COMPARISON: Same day x-ray and CT 10/20/2023. CLINICAL HISTORY: Pulmonary embolism (PE) suspected, high prob. sob. FINDINGS: PULMONARY ARTERIES: Pulmonary arteries are adequately opacified for evaluation. No acute pulmonary embolus. Main pulmonary artery is normal in caliber. MEDIASTINUM: The heart and pericardium demonstrate no acute abnormality. There is no acute abnormality of the thoracic aorta.  LYMPH NODES: No mediastinal, hilar or axillary lymphadenopathy. LUNGS AND PLEURA: Bibasilar scarring. Mild emphysema. No focal pneumonia. No pleural effusion or pneumothorax. UPPER ABDOMEN: Hepatic steatosis. Nodular hepatic contour suggesting cirrhosis. Trace perihepatic ascites. SOFT TISSUES AND BONES: No acute bone or soft tissue abnormality. IMPRESSION: 1. No pulmonary embolism. 2. Trace perihepatic ascites. Electronically signed by: Norman Gatlin MD 11/09/2023 11:46 PM EDT RP Workstation: [id number]   CT Cervical Spine Wo Contrast Result Date: 11/09/2023 CLINICAL DATA:  Clemens, altered level of consciousness, intoxicated EXAM: CT CERVICAL SPINE WITHOUT CONTRAST TECHNIQUE: Multidetector CT imaging of the cervical spine was performed without intravenous contrast. Multiplanar CT image reconstructions were also generated. RADIATION DOSE REDUCTION: This exam was performed according to the departmental dose-optimization program which includes automated exposure control, adjustment of the mA and/or kV according to patient size and/or use of iterative reconstruction technique. COMPARISON:  None Available. FINDINGS: Alignment: Alignment is grossly  anatomic. Skull base and vertebrae: No acute fracture. No primary bone lesion or focal pathologic process. Soft tissues and spinal canal: No prevertebral fluid or swelling. No visible canal hematoma. Prominent atherosclerosis of the carotid bifurcations. Disc levels: Multilevel facet hypertrophic changes most pronounced from C2-3 through C4-5. Multilevel facet hypertrophic changes most pronounced at C5-6 and C6-7. Upper chest: Airway is patent. Emphysematous changes are seen at the lung apices. Other: Reconstructed images demonstrate no additional findings. IMPRESSION: 1. No acute cervical spine fracture. 2. Multilevel cervical degenerative changes as above. 3. Pulmonary emphysema is present. Pulmonary emphysema is an independent risk factor for lung cancer. Recommend patient be considered for lung cancer screening. [place]  Chief Financial Officer. Screening for Lung Cancer: [place]  Licensed Conveyancer. JAMA. 2021;325(10):962-970. Electronically Signed   By: Ozell Daring M.D.   On: 11/09/2023 22:41   CT Head Wo Contrast Result Date: 11/09/2023 CLINICAL DATA:  Head trauma EXAM: CT HEAD WITHOUT CONTRAST TECHNIQUE: Contiguous axial images were obtained from the base of the skull through the vertex without intravenous contrast. RADIATION DOSE REDUCTION: This exam was performed according to the departmental dose-optimization program which includes automated exposure control, adjustment of the mA and/or kV according to patient size and/or use of iterative reconstruction technique. COMPARISON:  09/14/2023 FINDINGS: Brain: No acute infarct or hemorrhage. The lateral ventricles and midline structures are unremarkable. No acute extra-axial fluid collections. No mass effect. Vascular: No hyperdense vessel or unexpected calcification. Skull: Normal. Negative for fracture or focal lesion. Sinuses/Orbits: Polypoid mucosal thickening within the maxillary sinuses. Remaining paranasal sinuses  are clear. Other: None. IMPRESSION: 1. Stable head CT, no acute intracranial process. Electronically Signed   By: Ozell Daring M.D.   On: 11/09/2023 22:37   DG Chest Portable 1 View Result Date: 11/09/2023 EXAM: 1 VIEW XRAY OF THE CHEST 11/09/2023 09:16:00 PM COMPARISON: Chest x-ray 10/20/2023, CT angio chest 10/20/2023, chest x-ray 12/26/2027. CLINICAL HISTORY: shob. History of interstitial lung disease, hypertension, COPD, cirrhosis. Some confusion noted and route. He denies any chest pain. FINDINGS: LUNGS AND PLEURA: No focal consolidation. Chronic coarsened interstitial markings with no overt pulmonary edema. No pleural effusion. No pneumothorax. No focal consolidation. HEART AND MEDIASTINUM: Atherosclerotic plaque. Unchanged cardiomediastinal silhouette. BONES AND SOFT TISSUES: No acute osseous abnormality. IMPRESSION: 1. No acute cardiopulmonary abnormality identified. 2. Emphysema. Electronically signed by: Morgane Naveau MD 11/09/2023 09:44 PM EDT RP Workstation: [id number]    Microbiology: No results found for this or any previous visit (from the past 240 hours).   Labs: Basic Metabolic Panel: Recent Labs  Lab 11/28/23 0429 11/29/23  [id number] 11/30/23 0406 12/01/23 0446 12/02/23 0436 12/03/23 0416  NA 140 139 138 138 136 135  K 3.2* 3.6 3.4* 3.4* 4.0 4.2  CL 110 111 109 111 110 107  CO2 21* 20* 21* 20* 20* 20*  GLUCOSE 98 151* 111* 119* 121* 101*  BUN 14 13 10 10 9 9   CREATININE 0.96 0.83 0.72 0.66 0.69 0.77  CALCIUM  8.5* 8.4* 8.5* 8.4* 8.8* 9.2  MG 2.0 2.1 1.9 2.0  --  1.9   Liver Function Tests: Recent Labs  Lab 11/28/23 0429 11/29/23 0431 11/30/23 0406 12/01/23 0446 12/02/23 0436  AST 60* 46* 43* 34 34  ALT 30 23 25 21 21   ALKPHOS 110 106 112 110 112  BILITOT 2.3* 1.8* 1.4* 1.4* 1.3*  PROT 6.3* 5.9* 5.8* 5.5* 6.0*  ALBUMIN  3.3* 3.1* 3.1* 2.9* 3.1*   No results for input(s): LIPASE, AMYLASE in the last 168 hours. Recent Labs  Lab 11/27/23 0424  11/28/23 0429 11/29/23 0833 11/30/23 0406  AMMONIA 85* 91* 71* 88*   CBC: Recent Labs  Lab 11/29/23 1353 11/30/23 0406 12/01/23 0446 12/02/23 0436 12/03/23 0416  WBC 7.8 6.8 4.8 5.3 6.8  NEUTROABS  --  4.0  --   --   --   HGB 7.4* 8.2* 8.0* 8.1* 8.7*  HCT 25.2* 26.8* 26.2* 28.2* 29.2*  MCV 80.0 79.5* 80.1 80.3 78.9*  PLT 55* 56* 60* 69* 90*   Cardiac Enzymes: No results for input(s): CKTOTAL, CKMB, CKMBINDEX, TROPONINI in the last 168 hours. BNP: BNP (last 3 results) Recent Labs    02/27/23 1741 10/20/23 0756 11/09/23 2042  BNP 22.2 20.6 49.8    ProBNP (last 3 results) No results for input(s): PROBNP in the last 8760 hours.  CBG: No results for input(s): GLUCAP in the last 168 hours.     Signed:  Toribio Hummer MD.  [facility] 12/03/2023, 3:00 PM

## 2023-12-03 NOTE — Progress Notes (Signed)
 Initial Nutrition Assessment  DOCUMENTATION CODES:   Not applicable  INTERVENTION:  - Regular diet per MD.  - Diet education with handout provided for cirrhosis nutrition therapy. - Boost Breeze po TID, each supplement provides 250 kcal and 9 grams of protein - Continue Multivitamin with minerals daily, folic acid , and thiamine  supplementation. - Monitor weight trends.    - Patient is due for a new weight.  NUTRITION DIAGNOSIS:   Increased nutrient needs related to chronic illness (cirrhosis) as evidenced by estimated needs.  GOAL:   Patient will meet greater than or equal to 90% of their needs  MONITOR:   PO intake, Supplement acceptance, Weight trends  REASON FOR ASSESSMENT:   Consult Assessment of nutrition requirement/status  ASSESSMENT:   63 y.o. male with PMH significant for alcoholism, decompensated cirrhosis, rheumatoid arthritis, interstitial lung disease, and HTN who presented with rectal bleeding and nausea with vomiting. Admitted for acute GI bleed.    Patient in bedside chair at time of visit, reports he is going home today.  Shares he has been eating well and had a very large lunch that was very good. He is documented to be consuming an average of 73% of meals over the past few days. Has also been drinking Boost Breeze 2-3 times daily.  Per EMR, weight stable over the past year.   Provided patient with cirrhosis diet education with handout. Discussed increased protein needs and limiting salt intake. Patient endorsed understanding, reports he was told by a doctor he needs to limit salty foods.    Medications reviewed and include: 1mg  folic acid , MVI, 100mg  thiamine , Lactulose  BID  Labs reviewed:  -   NUTRITION - FOCUSED PHYSICAL EXAM:  Declined  Diet Order:   Diet Order             Diet regular Fluid consistency: Thin  Diet effective now                   EDUCATION NEEDS:  Education needs have been addressed  Skin:  Skin Assessment:  Reviewed RN Assessment  Last BM:  11/8  Height:  Ht Readings from Last 1 Encounters:  11/25/23 5' 9 (1.753 m)   Weight:  Wt Readings from Last 1 Encounters:  11/25/23 91.9 kg   Ideal Body Weight:  72.73 kg  BMI:  Body mass index is 29.92 kg/m.  Estimated Nutritional Needs:  Kcal:  1950-2300 kcals Protein:  90-110 grams Fluid:  >/= 2L    Trude Ned RD, LDN Contact via Secure Chat.

## 2023-12-03 NOTE — Progress Notes (Signed)
 Discharge instructions given to patient, IV removed, meds to bedside; patient has no questions/concerns at this time. All personal items accounted for/patient refused walker by CM and was informed that he would not have a walker when exiting the taxi when he gets home; patient gave his verbal understanding of this. Patient transported via wheelchair to taxi service by NT without any issues.   Patient's daughter called for an update 10 minutes after discharge.

## 2023-12-03 NOTE — TOC Transition Note (Signed)
 Transition of Care [facility]) - Discharge Note   Patient Details  Name: Robert Greene MRN: [id number] Date of Birth: May 04, 1960  [facility] CM/SW Contact:  Tawni CHRISTELLA Eva, LCSW Phone Number: 12/03/2023, 3:32 PM   Clinical Narrative:     CSW spoke with pt to discuss rec for DME walker , pt reports having rolling walker at home . Pt is agreeable to [facility] continuing services. Pt is requesting a ride home; pt states that his family is out of town and has no money to get ride. CSW has provided taxi voucher for transport home. No further ICM needs , ICM sign off.     Final next level of care: Home w Home Health Services Barriers to Discharge: No Barriers Identified   Patient Goals and CMS Choice Patient states their goals for this hospitalization and ongoing recovery are:: retrun home with home healh CMS Medicare.gov Compare Post Acute Care list provided to:: Patient Choice offered to / list presented to : Patient, Adult Children      Discharge Placement                    Patient and family notified of of transfer: 12/03/23  Discharge Plan and Services Additional resources added to the After Visit Summary for                                       Social Drivers of Health (SDOH) Interventions SDOH Screenings   Food Insecurity: No Food Insecurity (11/25/2023)  Housing: Low Risk  (11/25/2023)  Transportation Needs: No Transportation Needs (11/25/2023)  Utilities: Not At Risk (11/25/2023)  Tobacco Use: High Risk (11/25/2023)     Readmission Risk Interventions     No data to display

## 2023-12-04 DIAGNOSIS — K922 Gastrointestinal hemorrhage, unspecified: Secondary | ICD-10-CM | POA: Diagnosis not present

## 2023-12-16 ENCOUNTER — Emergency Department (HOSPITAL_COMMUNITY)

## 2023-12-16 ENCOUNTER — Other Ambulatory Visit: Payer: Self-pay

## 2023-12-16 ENCOUNTER — Emergency Department (HOSPITAL_COMMUNITY)
Admission: EM | Admit: 2023-12-16 | Discharge: 2023-12-16 | Disposition: A | Discharge status: Home / Self Care | Attending: Emergency Medicine | Admitting: Emergency Medicine

## 2023-12-16 ENCOUNTER — Encounter (HOSPITAL_COMMUNITY): Payer: Self-pay

## 2023-12-16 DIAGNOSIS — R0602 Shortness of breath: Secondary | ICD-10-CM | POA: Diagnosis not present

## 2023-12-16 DIAGNOSIS — Z79899 Other long term (current) drug therapy: Secondary | ICD-10-CM | POA: Insufficient documentation

## 2023-12-16 DIAGNOSIS — R531 Weakness: Secondary | ICD-10-CM | POA: Insufficient documentation

## 2023-12-16 DIAGNOSIS — R457 State of emotional shock and stress, unspecified: Secondary | ICD-10-CM | POA: Diagnosis not present

## 2023-12-16 DIAGNOSIS — R000 Tachycardia, unspecified: Secondary | ICD-10-CM | POA: Insufficient documentation

## 2023-12-16 DIAGNOSIS — I10 Essential (primary) hypertension: Secondary | ICD-10-CM | POA: Diagnosis not present

## 2023-12-16 DIAGNOSIS — I491 Atrial premature depolarization: Secondary | ICD-10-CM | POA: Diagnosis not present

## 2023-12-16 DIAGNOSIS — R0989 Other specified symptoms and signs involving the circulatory and respiratory systems: Secondary | ICD-10-CM | POA: Diagnosis not present

## 2023-12-16 LAB — CBC WITH DIFFERENTIAL/PLATELET
Abs Immature Granulocytes: 0.03 K/uL (ref 0.00–0.07)
Basophils Absolute: 0.1 K/uL (ref 0.0–0.1)
Basophils Relative: 1 %
Eosinophils Absolute: 0 K/uL (ref 0.0–0.5)
Eosinophils Relative: 1 %
HCT: 29.2 % — ABNORMAL LOW (ref 39.0–52.0)
Hemoglobin: 9.1 g/dL — ABNORMAL LOW (ref 13.0–17.0)
Immature Granulocytes: 1 %
Lymphocytes Relative: 27 %
Lymphs Abs: 1.5 K/uL (ref 0.7–4.0)
MCH: 23.6 pg — ABNORMAL LOW (ref 26.0–34.0)
MCHC: 31.2 g/dL (ref 30.0–36.0)
MCV: 75.8 fL — ABNORMAL LOW (ref 80.0–100.0)
Monocytes Absolute: 0.7 K/uL (ref 0.1–1.0)
Monocytes Relative: 13 %
Neutro Abs: 3.2 K/uL (ref 1.7–7.7)
Neutrophils Relative %: 57 %
Platelets: 119 K/uL — ABNORMAL LOW (ref 150–400)
RBC: 3.85 MIL/uL — ABNORMAL LOW (ref 4.22–5.81)
RDW: 20.4 % — ABNORMAL HIGH (ref 11.5–15.5)
WBC: 5.6 K/uL (ref 4.0–10.5)
nRBC: 0 % (ref 0.0–0.2)

## 2023-12-16 LAB — I-STAT CHEM 8, ED
BUN: 6 mg/dL — ABNORMAL LOW (ref 8–23)
Calcium, Ion: 1.06 mmol/L — ABNORMAL LOW (ref 1.15–1.40)
Chloride: 110 mmol/L (ref 98–111)
Creatinine, Ser: 0.8 mg/dL (ref 0.61–1.24)
Glucose, Bld: 97 mg/dL (ref 70–99)
HCT: 24 % — ABNORMAL LOW (ref 39.0–52.0)
Hemoglobin: 8.2 g/dL — ABNORMAL LOW (ref 13.0–17.0)
Potassium: 3.5 mmol/L (ref 3.5–5.1)
Sodium: 141 mmol/L (ref 135–145)
TCO2: 21 mmol/L — ABNORMAL LOW (ref 22–32)

## 2023-12-16 LAB — TROPONIN I (HIGH SENSITIVITY)
Troponin I (High Sensitivity): 15 ng/L (ref <18)
Troponin I (High Sensitivity): 21 ng/L — ABNORMAL HIGH (ref <18)

## 2023-12-16 LAB — COMPREHENSIVE METABOLIC PANEL WITH GFR
ALT: 22 U/L (ref 0–44)
AST: 68 U/L — ABNORMAL HIGH (ref 15–41)
Albumin: 3 g/dL — ABNORMAL LOW (ref 3.5–5.0)
Alkaline Phosphatase: 97 U/L (ref 38–126)
Anion gap: 15 (ref 5–15)
BUN: 8 mg/dL (ref 8–23)
CO2: 17 mmol/L — ABNORMAL LOW (ref 22–32)
Calcium: 8.9 mg/dL (ref 8.9–10.3)
Chloride: 107 mmol/L (ref 98–111)
Creatinine, Ser: 0.94 mg/dL (ref 0.61–1.24)
GFR, Estimated: >60 mL/min (ref >60)
Glucose, Bld: 127 mg/dL — ABNORMAL HIGH (ref 70–99)
Potassium: 4.4 mmol/L (ref 3.5–5.1)
Sodium: 139 mmol/L (ref 135–145)
Total Bilirubin: 1.9 mg/dL — ABNORMAL HIGH (ref 0.0–1.2)
Total Protein: 6.7 g/dL (ref 6.5–8.1)

## 2023-12-16 LAB — PROTIME-INR
INR: 1.2 (ref 0.8–1.2)
Prothrombin Time: 15.5 s — ABNORMAL HIGH (ref 11.4–15.2)

## 2023-12-16 LAB — I-STAT CG4 LACTIC ACID, ED
Lactic Acid, Venous: 4.9 mmol/L (ref 0.5–1.9)
Lactic Acid, Venous: 1.3 mmol/L (ref 0.5–1.9)

## 2023-12-16 LAB — SARS CORONAVIRUS 2 BY RT PCR: SARS Coronavirus 2 by RT PCR: NEGATIVE

## 2023-12-16 LAB — LIPASE, BLOOD: Lipase: 68 U/L — ABNORMAL HIGH (ref 11–51)

## 2023-12-16 LAB — ETHANOL: Alcohol, Ethyl (B): <15 mg/dL (ref <15)

## 2023-12-16 LAB — BRAIN NATRIURETIC PEPTIDE: B Natriuretic Peptide: 55.6 pg/mL (ref 0.0–100.0)

## 2023-12-16 MED ORDER — SODIUM CHLORIDE 0.9 % IV BOLUS
500.0000 mL | Freq: Once | INTRAVENOUS | Status: AC
  Administered 2023-12-16: 500 mL via INTRAVENOUS

## 2023-12-16 MED ORDER — LORAZEPAM 1 MG PO TABS: 0.0000 mg | ORAL_TABLET | Freq: Four times a day (QID) | ORAL | Status: DC

## 2023-12-16 MED ORDER — LORAZEPAM 2 MG/ML IJ SOLN
0.0000 mg | Freq: Four times a day (QID) | INTRAMUSCULAR | Status: DC
  Administered 2023-12-16: 2 mg via INTRAVENOUS
  Filled 2023-12-16: qty 1

## 2023-12-16 MED ORDER — LACTATED RINGERS IV BOLUS
1000.0000 mL | Freq: Once | INTRAVENOUS | Status: AC
  Administered 2023-12-16: 1000 mL via INTRAVENOUS

## 2023-12-16 MED ORDER — SODIUM CHLORIDE 0.9 % IV BOLUS
1000.0000 mL | Freq: Once | INTRAVENOUS | Status: AC
  Administered 2023-12-16: 1000 mL via INTRAVENOUS

## 2023-12-16 MED ORDER — THIAMINE HCL 100 MG/ML IJ SOLN
100.0000 mg | Freq: Every day | INTRAMUSCULAR | Status: DC
  Administered 2023-12-16: 100 mg via INTRAVENOUS
  Filled 2023-12-16: qty 2

## 2023-12-16 MED ORDER — LORAZEPAM 1 MG PO TABS: 0.0000 mg | ORAL_TABLET | Freq: Two times a day (BID) | ORAL | Status: DC

## 2023-12-16 MED ORDER — LORAZEPAM 2 MG/ML IJ SOLN: 0.0000 mg | Freq: Two times a day (BID) | INTRAMUSCULAR | Status: DC

## 2023-12-16 MED ORDER — THIAMINE MONONITRATE 100 MG PO TABS: 100.0000 mg | ORAL_TABLET | Freq: Every day | ORAL | Status: DC

## 2023-12-16 NOTE — Discharge Instructions (Addendum)
 As discussed, with your history of pulmonary fibrosis, as well as cirrhosis is very important that you minimize alcohol and cigarette use.  Stable hydrated, monitor your condition carefully and be sure to follow-up with your physician tomorrow.  As discussed, if you develop new, or concerning changes prior to following up with your physician, return here.

## 2023-12-16 NOTE — ED Notes (Signed)
 Pt verbalized understanding of discharge instructions. Pt ambulatory at time of discharge.

## 2023-12-16 NOTE — ED Provider Notes (Signed)
 [place] EMERGENCY DEPARTMENT AT [facility] Provider Note   CSN: [id number] Arrival date & time: 12/16/23  1013     Patient presents with: Shortness of Breath and Tachycardia   Robert Greene is a 63 y.o. male.   HPI Patient with a history of cirrhosis, pulmonary fibrosis, currently drinking, smoking, presents with anxiousness, discomfort, dyspnea, fatigue. Unclear exact onset, but patient notes that over the past days he has become increasingly uncomfortable, with all of the above complaints. Discomfort throughout his thoracoabdominal region without focality, no focal weakness, no confusion, no fever.  There is associated nausea.    Prior to Admission medications   Medication Sig Start Date End Date Taking? Authorizing Provider  albuterol  (VENTOLIN  HFA) 108 (90 Base) MCG/ACT inhaler Inhale 1-2 puffs into the lungs every 6 (six) hours as needed for wheezing or shortness of breath. 11/09/23   Henderly, Britni A, PA-C  carvedilol  (COREG ) 3.125 MG tablet Take 1 tablet (3.125 mg total) by mouth 2 (two) times daily with a meal. 09/17/23   Rosario Eland I, MD  EPINEPHrine  0.3 mg/0.3 mL IJ SOAJ injection Use once.  If ineffective, use 2nd dose. Patient not taking: Reported on 11/25/2023 11/19/16   Dean Clarity, MD  etanercept (ENBREL) 50 MG/ML injection Inject 50 mg into the skin every Saturday.    [provider]  folic acid  (FOLVITE ) 1 MG tablet Take 1 tablet (1 mg total) by mouth daily. 09/18/23   Rosario Eland FERNS, MD  furosemide  (LASIX ) 20 MG tablet Take 1 tablet (20 mg total) by mouth daily as needed for fluid or edema. 09/20/23   Sebastian Toribio GAILS, MD  hydroxychloroquine  (PLAQUENIL ) 200 MG tablet Take 200 mg by mouth daily. 02/06/23   [provider]  lactulose  (CHRONULAC ) 10 GM/15ML solution Take 30 mLs (20 g total) by mouth 2 (two) times daily. 12/03/23   Sebastian Toribio GAILS, MD  losartan  (COZAAR ) 100 MG tablet Take 100 mg by mouth daily. 08/26/23    [provider]  pantoprazole  (PROTONIX ) 40 MG tablet Take 1 tablet (40 mg total) by mouth daily before breakfast. 12/04/23   Sebastian Toribio GAILS, MD  Thiamine  HCl (B-1) 100 MG TABS Take 1 tablet by mouth daily. 11/10/23   [provider]  triamcinolone  cream (KENALOG) 0.1 % Apply 1 Application topically 2 (two) times daily. 12/07/23   [provider]    Allergies: Bee venom and Spider antivenin [s black widow (latrodec mactans) antivenin]    Review of Systems  Updated Vital Signs BP (!) 158/81   Pulse (!) 108   Temp 99.5 F (37.5 C) (Oral)   Resp (!) 21   Ht 1.753 m (5' 9)   Wt 90 kg   SpO2 99%   BMI 29.30 kg/m   Physical Exam Vitals and nursing note reviewed.  Constitutional:      Appearance: He is ill-appearing.  HENT:     Head: Normocephalic and atraumatic.  Eyes:     Conjunctiva/sclera: Conjunctivae normal.  Cardiovascular:     Rate and Rhythm: Regular rhythm. Tachycardia present.  Pulmonary:     Effort: Pulmonary effort is normal. Tachypnea present.     Breath sounds: Decreased breath sounds present.  Abdominal:     General: There is no distension.     Palpations: Abdomen is soft.     Comments: Protuberant but soft abdomen.  Skin:    General: Skin is warm and dry.  Neurological:     Mental Status: He is alert  and oriented to person, place, and time.  Psychiatric:        Mood and Affect: Mood is anxious.     (all labs ordered are listed, but only abnormal results are displayed) Labs Reviewed  COMPREHENSIVE METABOLIC PANEL WITH GFR - Abnormal; Notable for the following components:      Result Value   CO2 17 (*)    Glucose, Bld 127 (*)    Albumin  3.0 (*)    AST 68 (*)    Total Bilirubin 1.9 (*)    All other components within normal limits  LIPASE, BLOOD - Abnormal; Notable for the following components:   Lipase 68 (*)    All other components within normal limits  CBC WITH DIFFERENTIAL/PLATELET - Abnormal; Notable for the  following components:   RBC 3.85 (*)    Hemoglobin 9.1 (*)    HCT 29.2 (*)    MCV 75.8 (*)    MCH 23.6 (*)    RDW 20.4 (*)    Platelets 119 (*)    All other components within normal limits  PROTIME-INR - Abnormal; Notable for the following components:   Prothrombin Time 15.5 (*)    All other components within normal limits  I-STAT CG4 LACTIC ACID, ED - Abnormal; Notable for the following components:   Lactic Acid, Venous 4.9 (*)    All other components within normal limits  I-STAT CHEM 8, ED - Abnormal; Notable for the following components:   BUN 6 (*)    Calcium , Ion 1.06 (*)    TCO2 21 (*)    Hemoglobin 8.2 (*)    HCT 24.0 (*)    All other components within normal limits  TROPONIN I (HIGH SENSITIVITY) - Abnormal; Notable for the following components:   Troponin I (High Sensitivity) 21 (*)    All other components within normal limits  SARS CORONAVIRUS 2 BY RT PCR  ETHANOL  BRAIN NATRIURETIC PEPTIDE  I-STAT CG4 LACTIC ACID, ED  TROPONIN I (HIGH SENSITIVITY)    EKG: EKG Interpretation Date/Time:  Sunday December 16 2023 10:31:34 EST Ventricular Rate:  122 PR Interval:    QRS Duration:  87 QT Interval:  333 QTC Calculation: 467 R Axis:   40  Text Interpretation: Sinus rhythm Premature ventricular complexes Artifact Abnormal ECG Confirmed by Garrick Charleston [phone]) on 12/16/2023 11:36:04 AM  Radiology: DG Chest 2 View Result Date: 12/16/2023 EXAM: 2 VIEW(S) XRAY OF THE CHEST 12/16/2023 11:14:13 AM COMPARISON: 11/25/2023 CLINICAL HISTORY: sob FINDINGS: LUNGS AND PLEURA: Lower lung volumes. No focal pulmonary opacity. No pleural effusion. No pneumothorax. HEART AND MEDIASTINUM: No acute abnormality of the cardiac and mediastinal silhouettes. BONES AND SOFT TISSUES: Multilevel thoracic osteophytosis. IMPRESSION: 1. No acute cardiopulmonary process. Electronically signed by: Waddell Calk MD 12/16/2023 11:19 AM EST RP Workstation: [id number]     Procedures   Medications  Ordered in the ED  LORazepam  (ATIVAN ) injection 0-4 mg (2 mg Intravenous Given 12/16/23 1119)    Or  LORazepam  (ATIVAN ) tablet 0-4 mg ( Oral See Alternative 12/16/23 1119)  LORazepam  (ATIVAN ) injection 0-4 mg (has no administration in time range)    Or  LORazepam  (ATIVAN ) tablet 0-4 mg (has no administration in time range)  thiamine  (VITAMIN B1) tablet 100 mg ( Oral See Alternative 12/16/23 1119)    Or  thiamine  (VITAMIN B1) injection 100 mg (100 mg Intravenous Given 12/16/23 1119)  sodium chloride  0.9 % bolus 500 mL (0 mLs Intravenous Stopped 12/16/23 1151)  sodium chloride  0.9 % bolus 1,000 mL (  0 mLs Intravenous Stopped 12/16/23 1303)  lactated ringers  bolus 1,000 mL (0 mLs Intravenous Stopped 12/16/23 1434)                                    Medical Decision Making Patient with interstitial lung disease, cirrhosis, active drinker, smoker, presents with fatigue, shortness of breath, nausea. With his ongoing alcohol use, concerns including intoxication, withdrawal, progression of disease, infection, electrolyte abnormalities, pneumonia all considered. Patient received initiation of CIWA protocol, fluids, monitoring. Cardiac 120 sinus tach abnormal pulse ox 100% room air normal  Amount and/or Complexity of Data Reviewed Independent Historian: EMS External Data Reviewed: notes. Labs: ordered. Decision-making details documented in ED Course. Radiology: ordered and independent interpretation performed. Decision-making details documented in ED Course. ECG/medicine tests: ordered and independent interpretation performed. Decision-making details documented in ED Course.  Risk OTC drugs. Prescription drug management. Decision regarding hospitalization. Diagnosis or treatment significantly limited by social determinants of health.  After initial assessment patient received fluids, Ativan  3:22 PM Heart rate now 105, 110, patient symptoms resolved, he is awake, alert, pleasantly  interactive.  He has received fluid resuscitation with 2 L, Ativan  has had no evidence for decompensated state, is substantially improved we discussed admission versus discharge, close outpatient follow-up, given his multiple medical issues including pulmonary fibrosis, liver cirrhosis, patient will follow-up with his primary care physician.     Final diagnoses:  Weakness  Tachycardia     Garrick Charleston, MD 12/16/23 1523

## 2023-12-16 NOTE — ED Triage Notes (Addendum)
 Pt presents to ED via EMS for shortness of breath. Lung sound clear with EMS. 100% RA. Took inhaler but says it did not help much. Bigeminy/ trigeminy on EKG. Reports he had some rectal bleeding last week and was admitted to the hospital. Pt reports having hx of liver cirrhosis and pulmonary fibrosis. Last drink yesterday morning, vodka.

## 2023-12-16 NOTE — ED Notes (Signed)
 CCMD contacted.

## 2023-12-31 ENCOUNTER — Encounter (HOSPITAL_COMMUNITY): Payer: Self-pay

## 2023-12-31 ENCOUNTER — Inpatient Hospital Stay (HOSPITAL_COMMUNITY)
Admission: EM | Admit: 2023-12-31 | Discharge: 2024-01-04 | DRG: 371 | Disposition: A | Attending: Internal Medicine | Admitting: Internal Medicine

## 2023-12-31 ENCOUNTER — Emergency Department (HOSPITAL_COMMUNITY)

## 2023-12-31 ENCOUNTER — Other Ambulatory Visit: Payer: Self-pay

## 2023-12-31 DIAGNOSIS — K529 Noninfective gastroenteritis and colitis, unspecified: Secondary | ICD-10-CM | POA: Diagnosis present

## 2023-12-31 DIAGNOSIS — E876 Hypokalemia: Secondary | ICD-10-CM

## 2023-12-31 DIAGNOSIS — E860 Dehydration: Principal | ICD-10-CM

## 2023-12-31 DIAGNOSIS — R197 Diarrhea, unspecified: Secondary | ICD-10-CM

## 2023-12-31 MED ORDER — SODIUM CHLORIDE 0.9 % IV BOLUS (SEPSIS)
1000.0000 mL | Freq: Once | INTRAVENOUS | Status: AC
  Administered 2024-01-01: 1000 mL via INTRAVENOUS

## 2023-12-31 NOTE — ED Triage Notes (Signed)
 Pt BIB [facility] for nausea and vomiting x 4 days. EMS reports that pt has cirrhosis and COPD and has been noncompliant with his medications. Pt is alert and oriented x 4 . EMS admin. Zofran  4 mg IV

## 2023-12-31 NOTE — ED Provider Notes (Signed)
 [place] EMERGENCY DEPARTMENT AT [facility] Provider Note   CSN: [id number] Arrival date & time: 12/31/23  2319     Patient presents with: Emesis   Robert Greene is a 63 y.o. male.  {Add pertinent medical, surgical, social history, OB history to YEP:67052} The history is provided by the patient.  Patient with extensive history including cirrhosis, CAD, hypertension, ILD, alcohol use disorder presents with nausea vomiting diarrhea since Friday December 5 He reports he has not had his medications since Thursday, December 4.  He has not had any alcohol for several days. He reports multiple episodes of nausea and dry heaving  He also reports multiple episodes of nonbloody liquid diarrhea. He denies any abdominal pain.  No fevers but reports chills and generalized weakness.  No syncope or falls No recent travel or antibiotics.  No known sick contacts.    Past Medical History:  Diagnosis Date   Alcoholism (HCC)    Anxiety    Arthritis    rheumatoid   Cirrhosis (HCC)    Coronary artery calcification seen on CAT scan 04/08/2018   Dyspnea    HTN (hypertension) 04/08/2018   Hypercholesteremia    Hypertension    Interstitial lung disease (HCC)    Kidney stones 2020   Pulmonary fibrosis (HCC)    Rheumatoid arthritis (HCC)     Prior to Admission medications   Medication Sig Start Date End Date Taking? Authorizing Provider  albuterol  (VENTOLIN  HFA) 108 (90 Base) MCG/ACT inhaler Inhale 1-2 puffs into the lungs every 6 (six) hours as needed for wheezing or shortness of breath. 11/09/23   Henderly, Britni A, PA-C  carvedilol  (COREG ) 3.125 MG tablet Take 1 tablet (3.125 mg total) by mouth 2 (two) times daily with a meal. 09/17/23   Rosario Eland I, MD  EPINEPHrine  0.3 mg/0.3 mL IJ SOAJ injection Use once.  If ineffective, use 2nd dose. Patient not taking: Reported on 11/25/2023 11/19/16   Dean Clarity, MD  etanercept (ENBREL) 50 MG/ML injection Inject 50 mg into  the skin every Saturday.    [provider]  folic acid  (FOLVITE ) 1 MG tablet Take 1 tablet (1 mg total) by mouth daily. 09/18/23   Rosario Eland FERNS, MD  furosemide  (LASIX ) 20 MG tablet Take 1 tablet (20 mg total) by mouth daily as needed for fluid or edema. 09/20/23   Sebastian Toribio GAILS, MD  hydroxychloroquine  (PLAQUENIL ) 200 MG tablet Take 200 mg by mouth daily. 02/06/23   [provider]  lactulose  (CHRONULAC ) 10 GM/15ML solution Take 30 mLs (20 g total) by mouth 2 (two) times daily. 12/03/23   Sebastian Toribio GAILS, MD  losartan  (COZAAR ) 100 MG tablet Take 100 mg by mouth daily. 08/26/23   [provider]  pantoprazole  (PROTONIX ) 40 MG tablet Take 1 tablet (40 mg total) by mouth daily before breakfast. 12/04/23   Sebastian Toribio GAILS, MD  Thiamine  HCl (B-1) 100 MG TABS Take 1 tablet by mouth daily. 11/10/23   [provider]  triamcinolone  cream (KENALOG) 0.1 % Apply 1 Application topically 2 (two) times daily. 12/07/23   [provider]    Allergies: Bee venom and Spider antivenin [s black widow (latrodec mactans) antivenin]    Review of Systems  Constitutional:  Positive for chills. Negative for fever.  Cardiovascular:  Negative for chest pain.  Gastrointestinal:  Positive for diarrhea and nausea. Negative for abdominal pain and blood in stool.  Neurological:  Negative for syncope.    Updated Vital Signs BP ROLLEN)  164/92   Pulse (!) 114   Temp 98.3 F (36.8 C) (Oral)   Resp 17   SpO2 100%   Physical Exam CONSTITUTIONAL disheveled, no acute distress HEAD: Normocephalic/atraumatic EYES: EOMI/PERRL, no icterus ENMT: Mucous membranes dry NECK: supple no meningeal signs CV: S1/S2 noted, tachycardic LUNGS: Crackles in the right base, mild tachypnea, no wheezing ABDOMEN: soft, nontender, no rebound or guarding, protuberant GU:no cva tenderness NEURO: Pt is awake/alert/appropriate, moves all extremitiesx4.  No facial droop.  No arm or leg drift  noted EXTREMITIES: pulses normal/equal, full ROM, chronic skin changes noted to bilateral legs No lower extremity edema SKIN: warm, color normal PSYCH: no abnormalities of mood noted, alert and oriented to situation  (all labs ordered are listed, but only abnormal results are displayed) Labs Reviewed  CBC WITH DIFFERENTIAL/PLATELET  COMPREHENSIVE METABOLIC PANEL WITH GFR  ETHANOL    EKG: EKG Interpretation Date/Time:  Monday December 31 2023 23:20:17 EST Ventricular Rate:  115 PR Interval:  147 QRS Duration:  91 QT Interval:  353 QTC Calculation: 487 R Axis:   25  Text Interpretation: Sinus tachycardia Multiform ventricular premature complexes Minimal ST depression, lateral leads Borderline prolonged QT interval Confirmed by Midge Golas ([id number]) on 12/31/2023 11:35:39 PM  EMS EKG reviewed, sinus tachycardia with a rate of 101, no other acute changes  Radiology: No results found.  {Document cardiac monitor, telemetry assessment procedure when appropriate:32947} Procedures   Medications Ordered in the ED  sodium chloride  0.9 % bolus 1,000 mL (has no administration in time range)      {Click here for ABCD2, HEART and other calculators REFRESH Note before signing:1}                              Medical Decision Making Amount and/or Complexity of Data Reviewed Labs: ordered. Radiology: ordered.   This patient presents to the ED for concern of vomiting and diarrhea, this involves an extensive number of treatment options, and is a complaint that carries with it a high risk of complications and morbidity.  The differential diagnosis includes but is not limited to gastroenteritis, diverticulitis, infectious colitis, ischemic colitis, C. difficile colitis  Comorbidities that complicate the patient evaluation: Patient's presentation is complicated by their history of cirrhosis, CAD  Social Determinants of Health: Patient's lives alone, alcohol use  increases the complexity  of managing their presentation  Additional history obtained: Records reviewed previous admission documents  Lab Tests: I Ordered, and personally interpreted labs.  The pertinent results include:  ***  Imaging Studies ordered: I ordered imaging studies including X-ray chest  I independently visualized and interpreted imaging which showed *** I agree with the radiologist interpretation  Cardiac Monitoring: The patient was maintained on a cardiac monitor.  I personally viewed and interpreted the cardiac monitor which showed an underlying rhythm of:  sinus tachycardia  Medicines ordered and prescription drug management: I ordered medication including normal saline for dehydration Reevaluation of the patient after these medicines showed that the patient    {resolved/improved/worsened:23923::improved}  Test Considered: Patient is low risk / negative by ***, therefore do not feel that *** is indicated.  Critical Interventions:  ***  Consultations Obtained: I requested consultation with the {consultation:26851}, and discussed  findings as well as pertinent plan - they recommend: ***  Reevaluation: After the interventions noted above, I reevaluated the patient and found that they have :{resolved/improved/worsened:23923::improved}  Complexity of problems addressed: Patient's presentation is most consistent with  acute presentation with potential threat to life or bodily function  Disposition: After consideration of the diagnostic results and the patient's response to treatment,  I feel that the patent would benefit from {disposition:26850}.     {Document critical care time when appropriate  Document review of labs and clinical decision tools ie CHADS2VASC2, etc  Document your independent review of radiology images and any outside records  Document your discussion with family members, caretakers and with consultants  Document social determinants of health affecting pt's care   Document your decision making why or why not admission, treatments were needed:32947:::1}   Final diagnoses:  None    ED Discharge Orders     None

## 2024-01-01 ENCOUNTER — Inpatient Hospital Stay (HOSPITAL_COMMUNITY)

## 2024-01-01 ENCOUNTER — Telehealth (HOSPITAL_COMMUNITY): Payer: Self-pay

## 2024-01-01 ENCOUNTER — Other Ambulatory Visit (HOSPITAL_COMMUNITY): Payer: Self-pay

## 2024-01-01 DIAGNOSIS — R918 Other nonspecific abnormal finding of lung field: Secondary | ICD-10-CM | POA: Diagnosis not present

## 2024-01-01 DIAGNOSIS — E860 Dehydration: Secondary | ICD-10-CM | POA: Diagnosis present

## 2024-01-01 DIAGNOSIS — R531 Weakness: Secondary | ICD-10-CM | POA: Diagnosis not present

## 2024-01-01 DIAGNOSIS — R161 Splenomegaly, not elsewhere classified: Secondary | ICD-10-CM | POA: Diagnosis not present

## 2024-01-01 DIAGNOSIS — R188 Other ascites: Secondary | ICD-10-CM | POA: Diagnosis not present

## 2024-01-01 DIAGNOSIS — E876 Hypokalemia: Secondary | ICD-10-CM | POA: Diagnosis not present

## 2024-01-01 DIAGNOSIS — K7460 Unspecified cirrhosis of liver: Secondary | ICD-10-CM | POA: Diagnosis not present

## 2024-01-01 DIAGNOSIS — K529 Noninfective gastroenteritis and colitis, unspecified: Secondary | ICD-10-CM | POA: Diagnosis present

## 2024-01-01 DIAGNOSIS — A0472 Enterocolitis due to Clostridium difficile, not specified as recurrent: Secondary | ICD-10-CM | POA: Diagnosis not present

## 2024-01-01 DIAGNOSIS — K8020 Calculus of gallbladder without cholecystitis without obstruction: Secondary | ICD-10-CM | POA: Diagnosis not present

## 2024-01-01 LAB — COMPREHENSIVE METABOLIC PANEL WITH GFR
ALT: 33 U/L (ref 0–44)
ALT: 31 U/L (ref 0–44)
AST: 81 U/L — ABNORMAL HIGH (ref 15–41)
AST: 73 U/L — ABNORMAL HIGH (ref 15–41)
Albumin: 3.1 g/dL — ABNORMAL LOW (ref 3.5–5.0)
Albumin: 2.9 g/dL — ABNORMAL LOW (ref 3.5–5.0)
Alkaline Phosphatase: 138 U/L — ABNORMAL HIGH (ref 38–126)
Alkaline Phosphatase: 122 U/L (ref 38–126)
Anion gap: 10 (ref 5–15)
Anion gap: 18 — ABNORMAL HIGH (ref 5–15)
BUN: 8 mg/dL (ref 8–23)
BUN: 9 mg/dL (ref 8–23)
CO2: 22 mmol/L (ref 22–32)
CO2: 13 mmol/L — ABNORMAL LOW (ref 22–32)
Calcium: 8.6 mg/dL — ABNORMAL LOW (ref 8.9–10.3)
Calcium: 7.8 mg/dL — ABNORMAL LOW (ref 8.9–10.3)
Chloride: 106 mmol/L (ref 98–111)
Chloride: 104 mmol/L (ref 98–111)
Creatinine, Ser: 0.81 mg/dL (ref 0.61–1.24)
Creatinine, Ser: 0.79 mg/dL (ref 0.61–1.24)
GFR, Estimated: >60 mL/min (ref >60)
GFR, Estimated: >60 mL/min (ref >60)
Glucose, Bld: 89 mg/dL (ref 70–99)
Glucose, Bld: 99 mg/dL (ref 70–99)
Potassium: 2.9 mmol/L — ABNORMAL LOW (ref 3.5–5.1)
Potassium: 3.2 mmol/L — ABNORMAL LOW (ref 3.5–5.1)
Sodium: 138 mmol/L (ref 135–145)
Sodium: 135 mmol/L (ref 135–145)
Total Bilirubin: 2.5 mg/dL — ABNORMAL HIGH (ref 0.0–1.2)
Total Bilirubin: 2.4 mg/dL — ABNORMAL HIGH (ref 0.0–1.2)
Total Protein: 6.7 g/dL (ref 6.5–8.1)
Total Protein: 6.3 g/dL — ABNORMAL LOW (ref 6.5–8.1)

## 2024-01-01 LAB — C DIFFICILE QUICK SCREEN W PCR REFLEX
C Diff antigen: POSITIVE — AB
C Diff interpretation: DETECTED
C Diff toxin: POSITIVE — AB

## 2024-01-01 LAB — CBC WITH DIFFERENTIAL/PLATELET
Abs Immature Granulocytes: 0.01 K/uL (ref 0.00–0.07)
Abs Immature Granulocytes: 0.02 K/uL (ref 0.00–0.07)
Basophils Absolute: 0 K/uL (ref 0.0–0.1)
Basophils Absolute: 0 K/uL (ref 0.0–0.1)
Basophils Relative: 1 %
Basophils Relative: 0 %
Eosinophils Absolute: 0.1 K/uL (ref 0.0–0.5)
Eosinophils Absolute: 0.1 K/uL (ref 0.0–0.5)
Eosinophils Relative: 2 %
Eosinophils Relative: 1 %
HCT: 26.2 % — ABNORMAL LOW (ref 39.0–52.0)
HCT: 25.1 % — ABNORMAL LOW (ref 39.0–52.0)
Hemoglobin: 8.1 g/dL — ABNORMAL LOW (ref 13.0–17.0)
Hemoglobin: 7.5 g/dL — ABNORMAL LOW (ref 13.0–17.0)
Immature Granulocytes: 0 %
Immature Granulocytes: 0 %
Lymphocytes Relative: 31 %
Lymphocytes Relative: 26 %
Lymphs Abs: 1.3 K/uL (ref 0.7–4.0)
Lymphs Abs: 1.2 K/uL (ref 0.7–4.0)
MCH: 23.5 pg — ABNORMAL LOW (ref 26.0–34.0)
MCH: 23.2 pg — ABNORMAL LOW (ref 26.0–34.0)
MCHC: 30.9 g/dL (ref 30.0–36.0)
MCHC: 29.9 g/dL — ABNORMAL LOW (ref 30.0–36.0)
MCV: 76.2 fL — ABNORMAL LOW (ref 80.0–100.0)
MCV: 77.7 fL — ABNORMAL LOW (ref 80.0–100.0)
Monocytes Absolute: 0.6 K/uL (ref 0.1–1.0)
Monocytes Absolute: 0.8 K/uL (ref 0.1–1.0)
Monocytes Relative: 14 %
Monocytes Relative: 17 %
Neutro Abs: 2.1 K/uL (ref 1.7–7.7)
Neutro Abs: 2.5 K/uL (ref 1.7–7.7)
Neutrophils Relative %: 52 %
Neutrophils Relative %: 56 %
Platelets: 51 K/uL — ABNORMAL LOW (ref 150–400)
Platelets: 49 K/uL — ABNORMAL LOW (ref 150–400)
RBC: 3.44 MIL/uL — ABNORMAL LOW (ref 4.22–5.81)
RBC: 3.23 MIL/uL — ABNORMAL LOW (ref 4.22–5.81)
RDW: 19.3 % — ABNORMAL HIGH (ref 11.5–15.5)
RDW: 19.3 % — ABNORMAL HIGH (ref 11.5–15.5)
WBC: 4.1 K/uL (ref 4.0–10.5)
WBC: 4.6 K/uL (ref 4.0–10.5)
nRBC: 0 % (ref 0.0–0.2)
nRBC: 0 % (ref 0.0–0.2)

## 2024-01-01 LAB — HEMOGLOBIN A1C
Hgb A1c MFr Bld: 5.2 % (ref 4.8–5.6)
Mean Plasma Glucose: 102.54 mg/dL

## 2024-01-01 LAB — PHOSPHORUS: Phosphorus: 2 mg/dL — ABNORMAL LOW (ref 2.5–4.6)

## 2024-01-01 LAB — C-REACTIVE PROTEIN: CRP: 0.7 mg/dL (ref <1.0)

## 2024-01-01 LAB — TSH: TSH: 0.311 u[IU]/mL — ABNORMAL LOW (ref 0.350–4.500)

## 2024-01-01 LAB — LACTIC ACID, PLASMA
Lactic Acid, Venous: 1.7 mmol/L (ref 0.5–1.9)
Lactic Acid, Venous: 2.3 mmol/L (ref 0.5–1.9)

## 2024-01-01 LAB — MAGNESIUM: Magnesium: 1.6 mg/dL — ABNORMAL LOW (ref 1.7–2.4)

## 2024-01-01 LAB — ETHANOL: Alcohol, Ethyl (B): <15 mg/dL (ref <15)

## 2024-01-01 MED ORDER — ACETAMINOPHEN 325 MG PO TABS
650.0000 mg | ORAL_TABLET | Freq: Four times a day (QID) | ORAL | Status: DC | PRN
  Administered 2024-01-01: 650 mg via ORAL
  Filled 2024-01-01: qty 2

## 2024-01-01 MED ORDER — ONDANSETRON HCL 4 MG PO TABS: 4.0000 mg | ORAL_TABLET | Freq: Four times a day (QID) | ORAL | Status: DC | PRN

## 2024-01-01 MED ORDER — SODIUM CHLORIDE 0.9 % IV SOLN: INTRAVENOUS | Status: AC

## 2024-01-01 MED ORDER — IOHEXOL 350 MG/ML SOLN
75.0000 mL | Freq: Once | INTRAVENOUS | Status: AC | PRN
  Administered 2024-01-01: 75 mL via INTRAVENOUS

## 2024-01-01 MED ORDER — ONDANSETRON HCL 4 MG/2ML IJ SOLN: 4.0000 mg | Freq: Four times a day (QID) | INTRAMUSCULAR | Status: DC | PRN

## 2024-01-01 MED ORDER — VANCOMYCIN HCL 125 MG PO CAPS
125.0000 mg | ORAL_CAPSULE | Freq: Four times a day (QID) | ORAL | Status: DC
  Administered 2024-01-01 – 2024-01-04 (×12): 125 mg via ORAL
  Filled 2024-01-01 (×16): qty 1

## 2024-01-01 MED ORDER — CARVEDILOL 3.125 MG PO TABS
3.1250 mg | ORAL_TABLET | Freq: Two times a day (BID) | ORAL | Status: DC
  Administered 2024-01-01 – 2024-01-04 (×6): 3.125 mg via ORAL
  Filled 2024-01-01 (×6): qty 1

## 2024-01-01 MED ORDER — THIAMINE MONONITRATE 100 MG PO TABS
100.0000 mg | ORAL_TABLET | Freq: Every day | ORAL | Status: AC
  Administered 2024-01-01 – 2024-01-04 (×4): 100 mg via ORAL
  Filled 2024-01-01 (×4): qty 1

## 2024-01-01 MED ORDER — ALBUTEROL SULFATE (2.5 MG/3ML) 0.083% IN NEBU: 2.5000 mg | INHALATION_SOLUTION | RESPIRATORY_TRACT | Status: DC | PRN

## 2024-01-01 MED ORDER — POTASSIUM CHLORIDE CRYS ER 20 MEQ PO TBCR
40.0000 meq | EXTENDED_RELEASE_TABLET | Freq: Once | ORAL | Status: AC
  Administered 2024-01-01: 40 meq via ORAL
  Filled 2024-01-01: qty 2

## 2024-01-01 MED ORDER — ACETAMINOPHEN 650 MG RE SUPP: 650.0000 mg | Freq: Four times a day (QID) | RECTAL | Status: DC | PRN

## 2024-01-01 MED ORDER — LACTULOSE 10 GM/15ML PO SOLN: 20.0000 g | Freq: Two times a day (BID) | ORAL | Status: DC

## 2024-01-01 MED ORDER — LOPERAMIDE HCL 2 MG PO CAPS
4.0000 mg | ORAL_CAPSULE | Freq: Once | ORAL | Status: AC
  Administered 2024-01-01: 4 mg via ORAL
  Filled 2024-01-01: qty 2

## 2024-01-01 MED ORDER — PANTOPRAZOLE SODIUM 40 MG PO TBEC
40.0000 mg | DELAYED_RELEASE_TABLET | Freq: Every day | ORAL | Status: DC
  Administered 2024-01-02: 40 mg via ORAL
  Filled 2024-01-01 (×2): qty 1

## 2024-01-01 MED ORDER — SODIUM CHLORIDE 0.9 % IV BOLUS (SEPSIS)
500.0000 mL | Freq: Once | INTRAVENOUS | Status: AC
  Administered 2024-01-01: 500 mL via INTRAVENOUS

## 2024-01-01 MED ORDER — SODIUM CHLORIDE 0.9 % IV BOLUS
1000.0000 mL | Freq: Once | INTRAVENOUS | Status: AC
  Administered 2024-01-01: 1000 mL via INTRAVENOUS

## 2024-01-01 MED ORDER — FOLIC ACID 1 MG PO TABS
1.0000 mg | ORAL_TABLET | Freq: Every day | ORAL | Status: DC
  Administered 2024-01-01 – 2024-01-04 (×4): 1 mg via ORAL
  Filled 2024-01-01 (×4): qty 1

## 2024-01-01 NOTE — Plan of Care (Signed)
   Problem: Activity: Goal: Risk for activity intolerance will decrease Outcome: Progressing   Problem: Nutrition: Goal: Adequate nutrition will be maintained Outcome: Progressing   Problem: Pain Managment: Goal: General experience of comfort will improve and/or be controlled Outcome: Progressing   Problem: Safety: Goal: Ability to remain free from injury will improve Outcome: Progressing

## 2024-01-01 NOTE — ED Notes (Signed)
 Pt assisted to bedside commode and HR was noted to be between 114-118 upon standing and pulse oximetry was 94% on room air

## 2024-01-01 NOTE — Telephone Encounter (Signed)
 Pharmacy Patient Advocate Encounter  Insurance verification completed.    The patient is insured through [employer]. Patient has Medicare and is not eligible for a copay card, but may be able to apply for patient assistance or Medicare RX Payment Plan (Patient Must reach out to their plan, if eligible for payment plan), if available.    Ran test claim for Vancomycin  125mg  capsule and the current 30 day co-pay is $90.30.   This test claim was processed through [facility]- copay amounts may vary at other pharmacies due to pharmacy/plan contracts, or as the patient moves through the different stages of their insurance plan.

## 2024-01-01 NOTE — ED Notes (Signed)
 Facilities contacted to repair the call light.

## 2024-01-01 NOTE — H&P (Addendum)
 History and Physical    Robert Greene FMW:[id number] DOB: 08/05/1960 DOA: 12/31/2023  PCP: [name], MD  Patient coming from:   I have personally briefly reviewed patient's old medical records in [facility]  Chief Complaint: n/v diarrhea  HPI: Robert Greene is a 63 y.o. male with medical history significant of  ILD, HTN, Cirrhosis with ascites, RA, thrombocytopenia, hx of Esophageal varices with recent admit 11/2 for  acute GI bleed, hx of hepatic  Encephalopathy, who presents to ED with close to one week history of n/v with profuse diarrhea.  Patient states he notes symptoms after he ate a baked potato. He thought that the potato may have been tainted but then become more concerned as symptoms continued to progressive. He states he began to feel weak and today due to persistent symptoms called Medic for assistance.  He note  blood in stools, and denies any know sick contacts. He notes no fever/chills/ chest pain / has mild epigastric discomfort but otherwise no abdominal pain.  He does state he feels better after fluids in ED.    ED Course:  Per EDP patient has multiple bout of watering diarrhea in ED and due to this patient was give imodium  with good effect.  Gi panel was ordered and currently awaiting collection.   Initial vitals:  Afeb bp 164/92, hr 114, sat 100% rr17 Wbc 4.1, hgb 8.1, plt 51 Na 138, K 2.9, Cl 106, cr 0.81 Ast 81  Alphos 138 Tbili 2.5 Etoh <15 Cxr  IMPRESSION: 1. No acute cardiopulmonary process identified. 2. Expiratory film with bronchovascular crowding and limited basal evaluation. 3. Perihilar and basal coarse linear markings, likely atelectasis. 4. Postsurgical changes in the right upper lobe.   IVF IMPRESSION: 1. No acute cardiopulmonary process identified. 2. Expiratory film with bronchovascular crowding and limited basal evaluation. 3. Perihilar and basal coarse linear markings, likely atelectasis. 4. Postsurgical changes in the right  upper lobe.    Review of Systems: As per HPI otherwise 10 point review of systems negative.   Past Medical History:  Diagnosis Date   Alcoholism (HCC)    Anxiety    Arthritis    rheumatoid   Cirrhosis (HCC)    Coronary artery calcification seen on CAT scan 04/08/2018   Dyspnea    HTN (hypertension) 04/08/2018   Hypercholesteremia    Hypertension    Interstitial lung disease (HCC)    Kidney stones 2020   Pulmonary fibrosis (HCC)    Rheumatoid arthritis [facility])     Past Surgical History:  Procedure Laterality Date   COLONOSCOPY N/A 08/03/2023   Procedure: COLONOSCOPY;  Surgeon: Wilhelmenia Aloha Raddle., MD;  Location: [facility];  Service: Gastroenterology;  Laterality: N/A;   ESOPHAGOGASTRODUODENOSCOPY N/A 08/03/2023   Procedure: EGD (ESOPHAGOGASTRODUODENOSCOPY);  Surgeon: Wilhelmenia Aloha Raddle., MD;  Location: [facility];  Service: Gastroenterology;  Laterality: N/A;   ESOPHAGOGASTRODUODENOSCOPY N/A 09/16/2023   Procedure: EGD (ESOPHAGOGASTRODUODENOSCOPY);  Surgeon: Nandigam, Kavitha V, MD;  Location: [facility];  Service: Gastroenterology;  Laterality: N/A;   ESOPHAGOGASTRODUODENOSCOPY N/A 11/28/2023   Procedure: EGD (ESOPHAGOGASTRODUODENOSCOPY);  Surgeon: Avram Lupita BRAVO, MD;  Location: [facility];  Service: Gastroenterology;  Laterality: N/A;   KNEE CARTILAGE SURGERY Left    x2   LUNG BIOPSY Right 12/06/2017   Procedure: LUNG BIOPSY;  Surgeon: Kerrin Elspeth BROCKS, MD;  Location: [facility] OR;  Service: Thoracic;  Laterality: Right;   VIDEO ASSISTED THORACOSCOPY Right 12/06/2017   Procedure: VIDEO ASSISTED THORACOSCOPY;  Surgeon: Kerrin Elspeth BROCKS, MD;  Location: [facility] OR;  Service: Thoracic;  Laterality: Right;     reports that he has been smoking cigarettes. He has a 60 pack-year smoking history. He has never used smokeless tobacco. He reports that he does not currently use alcohol. He reports that he does not use drugs.  Allergies  Allergen Reactions   Bee Venom  Anaphylaxis, Hives and Rash   Spider Antivenin [S Black Widow (Latrodec Mactans) Antivenin] Anaphylaxis, Hives and Rash    Family History  Problem Relation Age of Onset   Cirrhosis Father    Colon cancer Neg Hx    Stomach cancer Neg Hx    Esophageal cancer Neg Hx    Inflammatory bowel disease Neg Hx    Liver disease Neg Hx    Pancreatic cancer Neg Hx    Rectal cancer Neg Hx     Prior to Admission medications   Medication Sig Start Date End Date Taking? Authorizing Provider  albuterol  (VENTOLIN  HFA) 108 (90 Base) MCG/ACT inhaler Inhale 1-2 puffs into the lungs every 6 (six) hours as needed for wheezing or shortness of breath. 11/09/23   Henderly, Britni A, PA-C  carvedilol  (COREG ) 3.125 MG tablet Take 1 tablet (3.125 mg total) by mouth 2 (two) times daily with a meal. 09/17/23   Rosario Eland I, MD  EPINEPHrine  0.3 mg/0.3 mL IJ SOAJ injection Use once.  If ineffective, use 2nd dose. Patient not taking: Reported on 11/25/2023 11/19/16   Dean Clarity, MD  etanercept (ENBREL) 50 MG/ML injection Inject 50 mg into the skin every Saturday.    [provider]  folic acid  (FOLVITE ) 1 MG tablet Take 1 tablet (1 mg total) by mouth daily. 09/18/23   Rosario Eland FERNS, MD  furosemide  (LASIX ) 20 MG tablet Take 1 tablet (20 mg total) by mouth daily as needed for fluid or edema. 09/20/23   Sebastian Toribio GAILS, MD  hydroxychloroquine  (PLAQUENIL ) 200 MG tablet Take 200 mg by mouth daily. 02/06/23   [provider]  lactulose  (CHRONULAC ) 10 GM/15ML solution Take 30 mLs (20 g total) by mouth 2 (two) times daily. 12/03/23   Sebastian Toribio GAILS, MD  losartan  (COZAAR ) 100 MG tablet Take 100 mg by mouth daily. 08/26/23   [provider]  pantoprazole  (PROTONIX ) 40 MG tablet Take 1 tablet (40 mg total) by mouth daily before breakfast. 12/04/23   Sebastian Toribio GAILS, MD  Thiamine  HCl (B-1) 100 MG TABS Take 1 tablet by mouth daily. 11/10/23   [provider]  triamcinolone  cream  (KENALOG) 0.1 % Apply 1 Application topically 2 (two) times daily. 12/07/23   [provider]    Physical Exam: Vitals:   12/31/23 2333 01/01/24 0305 01/01/24 0334 01/01/24 0345  BP:  (!) 159/92 (!) 150/82 117/64  Pulse:  (!) 105 96 90  Resp:  (!) 23 17 17   Temp: 98.3 F (36.8 C) 98.6 F (37 C)    TempSrc: Oral Oral    SpO2:  100% 100% 100%    Constitutional: NAD, calm, comfortable Vitals:   12/31/23 2333 01/01/24 0305 01/01/24 0334 01/01/24 0345  BP:  (!) 159/92 (!) 150/82 117/64  Pulse:  (!) 105 96 90  Resp:  (!) 23 17 17   Temp: 98.3 F (36.8 C) 98.6 F (37 C)    TempSrc: Oral Oral    SpO2:  100% 100% 100%   Eyes: PERRL, lids and conjunctivae normal ENMT: Mucous membranes are moist. Posterior pharynx clear of any exudate or lesions.Normal dentition.  Neck: normal, supple,  no masses, no thyromegaly Respiratory: clear to auscultation bilaterally, no wheezing, no crackles. Normal respiratory effort. No accessory muscle use.  Cardiovascular: Regular rate and rhythm, no murmurs / rubs / gallops. No extremity edema. 2+ pedal pulses. No carotid bruits.  Abdomen: no tenderness, no masses palpated. No hepatosplenomegaly. Bowel sounds positive.  Musculoskeletal: no clubbing / cyanosis. No joint deformity upper and lower extremities. Good ROM, no contractures. Normal muscle tone.  Skin: no rashes, lesions, ulcers. No induration Neurologic: CN 2-12 grossly intact. Sensation intact, DTR normal. Strength 5/5 in all 4.  Psychiatric: Normal judgment and insight. Alert and oriented x 3. Normal mood.    Labs on Admission: I have personally reviewed following labs and imaging studies  CBC: Recent Labs  Lab 12/31/23 2334  WBC 4.1  NEUTROABS 2.1  HGB 8.1*  HCT 26.2*  MCV 76.2*  PLT 51*   Basic Metabolic Panel: Recent Labs  Lab 12/31/23 2334  NA 138  K 2.9*  CL 106  CO2 22  GLUCOSE 89  BUN 8  CREATININE 0.81  CALCIUM  8.6*   GFR: CrCl cannot be calculated  (Unknown ideal weight.). Liver Function Tests: Recent Labs  Lab 12/31/23 2334  AST 81*  ALT 33  ALKPHOS 138*  BILITOT 2.5*  PROT 6.7  ALBUMIN  3.1*   No results for input(s): LIPASE, AMYLASE in the last 168 hours. No results for input(s): AMMONIA in the last 168 hours. Coagulation Profile: No results for input(s): INR, PROTIME in the last 168 hours. Cardiac Enzymes: No results for input(s): CKTOTAL, CKMB, CKMBINDEX, TROPONINI in the last 168 hours. BNP (last 3 results) No results for input(s): PROBNP in the last 8760 hours. HbA1C: No results for input(s): HGBA1C in the last 72 hours. CBG: No results for input(s): GLUCAP in the last 168 hours. Lipid Profile: No results for input(s): CHOL, HDL, LDLCALC, TRIG, CHOLHDL, LDLDIRECT in the last 72 hours. Thyroid  Function Tests: No results for input(s): TSH, T4TOTAL, FREET4, T3FREE, THYROIDAB in the last 72 hours. Anemia Panel: No results for input(s): VITAMINB12, FOLATE, FERRITIN, TIBC, IRON, RETICCTPCT in the last 72 hours. Urine analysis:    Component Value Date/Time   COLORURINE YELLOW 11/09/2023 2107   APPEARANCEUR CLEAR 11/09/2023 2107   LABSPEC 1.003 (L) 11/09/2023 2107   PHURINE 7.0 11/09/2023 2107   GLUCOSEU NEGATIVE 11/09/2023 2107   HGBUR NEGATIVE 11/09/2023 2107   BILIRUBINUR NEGATIVE 11/09/2023 2107   BILIRUBINUR negative 09/16/2019 1114   KETONESUR NEGATIVE 11/09/2023 2107   PROTEINUR NEGATIVE 11/09/2023 2107   UROBILINOGEN 0.2 09/16/2019 1114   UROBILINOGEN 1.0 06/10/2009 1916   NITRITE NEGATIVE 11/09/2023 2107   LEUKOCYTESUR NEGATIVE 11/09/2023 2107    Radiological Exams on Admission: DG Chest Portable 1 View Result Date: 01/01/2024 EXAM: 1 VIEW(S) XRAY OF THE CHEST 01/01/2024 12:02:59 AM COMPARISON: AP and lateral chest 12/16/2023. CLINICAL HISTORY: weakness FINDINGS: LUNGS AND PLEURA: The lungs are expiratory with bronchovascular crowding and limited  view of the bases. There is perihilar and basal coarse linear markings which are probably due to atelectasis. There is no convincing focal pneumonia. Postsurgical changes are again noted of the right upper lobe. No pleural effusion. No pneumothorax. HEART AND MEDIASTINUM: No acute abnormality of the cardiac and mediastinal silhouettes. BONES AND SOFT TISSUES: Thoracic spondylosis. IMPRESSION: 1. No acute cardiopulmonary process identified. 2. Expiratory film with bronchovascular crowding and limited basal evaluation. 3. Perihilar and basal coarse linear markings, likely atelectasis. 4. Postsurgical changes in the right upper lobe. Electronically signed by: Francis Quam MD 01/01/2024 12:08  AM EST RP Workstation: [id number]    EKG: Independently reviewed.   Assessment/Plan Acute infectious gastroenteritis  -admit to med tele  - supportive care with ivfs  - can repeat imodium  dosing once patient has negative stool study  - will check c-dif was on abx  one mo ago during admission for gi bleed  -no current blood in stool noted  - hgb stable at baseline of around 8  Hx of Cirrhosis  -complicated varices /GI bleed / Ascites  -patient states still intermittently drinks, note last drink a few days ago , he denies w/d symptoms.  Thrombocytopenia -due to liver disease/etoh abuse  -continue to monitor ,currently 51  Elevated Lfts/Alphos -presumed due to cirrhosis  -CT abd/pelvis pending to ensure no new obstructive compoenent -patient denies fever/chills   RA ILD -hold enbrel in setting of infection  - continue plaquenil    Hypertension -currently stable  -resume home setting   DVT prophylaxis: SCD Code Status: full Family Communication: none at bedside Disposition Plan: patient  expected to be admitted greater than 2 midnights  Consults called: n/a Admission status: med tele   Camila DELENA Ned MD [facility]   If 7PM-7AM, please contact  night-coverage [email or web address] Password [email or web address]  01/01/2024, 4:04 AM

## 2024-01-01 NOTE — Progress Notes (Signed)
 No charge note  Patient seen and examined this morning, admitted overnight, H&P reviewed and I agree with assessment and plan  In brief, this is a 63 year old male with history of liver cirrhosis with prior ascites, history of esophageal varices with recent admitted 11/2 for acute GI bleed, hepatic encephalopathy, ongoing EtOH use most recently drank some vodka with orange juice about 5 days ago comes into the hospital with nausea, vomiting, poor p.o. intake as well as profuse watery diarrhea for the past 5 days.  He thinks is related to a baked potato which may have been tainted.  In the ER he is hypokalemic with a potassium of 2.9 on admission, white count is 4.1, hemoglobin 8.1, platelets 51K.  He was admitted for hypokalemia, persistent diarrhea with poor p.o. intake, dehydration  Principal problem C. difficile gastroenteritis -patient C. difficile came back positive.  Started p.o. vancomycin .  This is likely in the setting of recent hospitalization, I believe he received antibiotics in the setting of his GI bleed as well. - Already received IV fluids in the ED, he is now normotensive  Active problems History of liver cirrhosis-with history of GI bleed recently, hemoglobin dropped a little bit on repeat but he has received IV fluids and may be dilutional.  He has no frank bleeding, he did tell me he saw some dark stools.  Fecal occult is pending but there is no frank bleeding  History of hepatic encephalopathy-due to ongoing diarrhea hold lactulose , please resume as soon as his stools are becoming more formed and less in number  Essential hypertension-resume home Coreg , hold furosemide  as well as other antihypertensives  Thrombocytopenia-due to liver disease, EtOH use  Alcohol use-last alcoholic drink was the day before he got sick, he drank vodka and orange juice.  Monitor for withdrawals, no significant symptoms at this point  Elevated LFTs-due to alcohol abuse, liver cirrhosis  RA,  ILD-hold Enbrel, he apparently does not take Plaquenil   Scheduled Meds:  carvedilol   3.125 mg Oral BID WC   folic acid   1 mg Oral Daily   [START ON 01/02/2024] pantoprazole   40 mg Oral QAC breakfast   thiamine   100 mg Oral Daily   vancomycin   125 mg Oral QID   Continuous Infusions:  sodium chloride  125 mL/hr at 01/01/24 0918   PRN Meds:.acetaminophen  **OR** acetaminophen , albuterol , ondansetron  **OR** ondansetron  (ZOFRAN ) IV   Doniesha Landau M. Trixie, MD, PhD [facility]  Between 7 am - 7 pm you can contact me via Amion (for emergencies) or Securechat (non urgent matters).  I am not available 7 pm - 7 am, please contact night coverage MD/APP via Amion

## 2024-01-02 ENCOUNTER — Other Ambulatory Visit (HOSPITAL_COMMUNITY): Payer: Self-pay

## 2024-01-02 DIAGNOSIS — D500 Iron deficiency anemia secondary to blood loss (chronic): Secondary | ICD-10-CM | POA: Diagnosis not present

## 2024-01-02 DIAGNOSIS — D649 Anemia, unspecified: Secondary | ICD-10-CM | POA: Diagnosis not present

## 2024-01-02 DIAGNOSIS — R112 Nausea with vomiting, unspecified: Secondary | ICD-10-CM

## 2024-01-02 DIAGNOSIS — E876 Hypokalemia: Secondary | ICD-10-CM | POA: Diagnosis not present

## 2024-01-02 DIAGNOSIS — A0472 Enterocolitis due to Clostridium difficile, not specified as recurrent: Secondary | ICD-10-CM

## 2024-01-02 DIAGNOSIS — K2951 Unspecified chronic gastritis with bleeding: Secondary | ICD-10-CM

## 2024-01-02 LAB — CBC
HCT: 22.2 % — ABNORMAL LOW (ref 39.0–52.0)
HCT: 23.7 % — ABNORMAL LOW (ref 39.0–52.0)
Hemoglobin: 6.9 g/dL — CL (ref 13.0–17.0)
Hemoglobin: 7.5 g/dL — ABNORMAL LOW (ref 13.0–17.0)
MCH: 23.7 pg — ABNORMAL LOW (ref 26.0–34.0)
MCH: 24.5 pg — ABNORMAL LOW (ref 26.0–34.0)
MCHC: 31.1 g/dL (ref 30.0–36.0)
MCHC: 31.6 g/dL (ref 30.0–36.0)
MCV: 76.3 fL — ABNORMAL LOW (ref 80.0–100.0)
MCV: 77.5 fL — ABNORMAL LOW (ref 80.0–100.0)
Platelets: 41 K/uL — ABNORMAL LOW (ref 150–400)
Platelets: 46 K/uL — ABNORMAL LOW (ref 150–400)
RBC: 2.91 MIL/uL — ABNORMAL LOW (ref 4.22–5.81)
RBC: 3.06 MIL/uL — ABNORMAL LOW (ref 4.22–5.81)
RDW: 18.9 % — ABNORMAL HIGH (ref 11.5–15.5)
RDW: 18.6 % — ABNORMAL HIGH (ref 11.5–15.5)
WBC: 3.8 K/uL — ABNORMAL LOW (ref 4.0–10.5)
WBC: 4.6 K/uL (ref 4.0–10.5)
nRBC: 0 % (ref 0.0–0.2)
nRBC: 0 % (ref 0.0–0.2)

## 2024-01-02 LAB — COMPREHENSIVE METABOLIC PANEL WITH GFR
ALT: 23 U/L (ref 0–44)
AST: 49 U/L — ABNORMAL HIGH (ref 15–41)
Albumin: 2.5 g/dL — ABNORMAL LOW (ref 3.5–5.0)
Alkaline Phosphatase: 107 U/L (ref 38–126)
Anion gap: 7 (ref 5–15)
BUN: 7 mg/dL — ABNORMAL LOW (ref 8–23)
CO2: 20 mmol/L — ABNORMAL LOW (ref 22–32)
Calcium: 7.7 mg/dL — ABNORMAL LOW (ref 8.9–10.3)
Chloride: 108 mmol/L (ref 98–111)
Creatinine, Ser: 0.74 mg/dL (ref 0.61–1.24)
GFR, Estimated: >60 mL/min (ref >60)
Glucose, Bld: 86 mg/dL (ref 70–99)
Potassium: 2.9 mmol/L — ABNORMAL LOW (ref 3.5–5.1)
Sodium: 135 mmol/L (ref 135–145)
Total Bilirubin: 2.1 mg/dL — ABNORMAL HIGH (ref 0.0–1.2)
Total Protein: 5.4 g/dL — ABNORMAL LOW (ref 6.5–8.1)

## 2024-01-02 LAB — MAGNESIUM: Magnesium: 1.4 mg/dL — ABNORMAL LOW (ref 1.7–2.4)

## 2024-01-02 LAB — GASTROINTESTINAL PANEL BY PCR, STOOL (REPLACES STOOL CULTURE)

## 2024-01-02 LAB — PROTIME-INR
INR: 1.5 — ABNORMAL HIGH (ref 0.8–1.2)
Prothrombin Time: 18.4 s — ABNORMAL HIGH (ref 11.4–15.2)

## 2024-01-02 LAB — PHOSPHORUS: Phosphorus: 1.5 mg/dL — ABNORMAL LOW (ref 2.5–4.6)

## 2024-01-02 LAB — LACTIC ACID, PLASMA: Lactic Acid, Venous: 0.9 mmol/L (ref 0.5–1.9)

## 2024-01-02 LAB — PREPARE RBC (CROSSMATCH)

## 2024-01-02 MED ORDER — MAGNESIUM SULFATE 4 GM/100ML IV SOLN
4.0000 g | Freq: Once | INTRAVENOUS | Status: AC
  Administered 2024-01-02: 4 g via INTRAVENOUS
  Filled 2024-01-02: qty 100

## 2024-01-02 MED ORDER — SODIUM CHLORIDE 0.9 % IV SOLN
2.0000 g | INTRAVENOUS | Status: DC
  Administered 2024-01-02 – 2024-01-03 (×2): 2 g via INTRAVENOUS
  Filled 2024-01-02 (×2): qty 20

## 2024-01-02 MED ORDER — LORAZEPAM 2 MG/ML IJ SOLN: 0.5000 mg | Freq: Once | INTRAMUSCULAR | Status: DC | PRN

## 2024-01-02 MED ORDER — PANTOPRAZOLE SODIUM 40 MG IV SOLR: 40.0000 mg | Freq: Two times a day (BID) | INTRAVENOUS | Status: DC

## 2024-01-02 MED ORDER — FAMOTIDINE 20 MG PO TABS
20.0000 mg | ORAL_TABLET | Freq: Two times a day (BID) | ORAL | Status: DC
  Administered 2024-01-02 – 2024-01-04 (×5): 20 mg via ORAL
  Filled 2024-01-02 (×5): qty 1

## 2024-01-02 MED ORDER — POTASSIUM PHOSPHATES 15 MMOLE/5ML IV SOLN
30.0000 mmol | Freq: Once | INTRAVENOUS | Status: AC
  Administered 2024-01-02: 30 mmol via INTRAVENOUS
  Filled 2024-01-02 (×2): qty 10

## 2024-01-02 MED ORDER — POTASSIUM CHLORIDE CRYS ER 20 MEQ PO TBCR
40.0000 meq | EXTENDED_RELEASE_TABLET | ORAL | Status: AC
  Administered 2024-01-02 (×2): 40 meq via ORAL
  Filled 2024-01-02 (×2): qty 2

## 2024-01-02 MED ORDER — SODIUM CHLORIDE 0.9% IV SOLUTION: Freq: Once | INTRAVENOUS | Status: AC

## 2024-01-02 NOTE — Progress Notes (Signed)
 Consult to replace PO4 of 1.4. We will replace with Kphos 30mmol x1. F/u in AM.  Sergio Batch, PharmD, BCIDP, AAHIVP, CPP Infectious Disease Pharmacist 01/02/2024 12:49 PM

## 2024-01-02 NOTE — Consult Note (Addendum)
 Consultation Note   Referring Provider:   [facility] PCP: Robert Cole, MD Primary Gastroenterologist:Robert Mansouraty,  MD       Reason for Consultation: Cirrhosis, concern for GI bleed DOA: 12/31/2023         Hospital Day: 3   ASSESSMENT    63 year old male with decompensated alcohol-related cirrhosis with history of esophageal variceal bleeding status post banding.  Only grade 1 esophageal varices, portal hypertensive gastropathy on EGD last month .  Currently admitted with nausea, vomiting, diarrhea (intermittently black) with decline in hemoglobin.  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  He has tested positive for C. difficile which is likely the cause for his nausea, vomiting and diarrhea rather than a major GI bleed.  MELD - not calculated, INR not updated.  No evidence for encephalopathy though he has not been taking lactulose  at home due to diarrhea.  He does have large-volume ascites.  Has not been taking diuretics at home due to vomiting.   C-difficile infection Oral Vancomycin  has already been started.    Electrolyte abnormalities in setting of GI losses and decreased p.o. intake Low phosphorus, low magnesium , low potassium.   Diminutive distal colon polyp seen at colonoscopy July 2025-Dr. Mansouraty plans colonoscopy and polypectomy when patient stable  See [facility] for any additional medical history  / medical problems  Principal Problem:   Gastroenteritis    PLAN:   --Potassium repletion per primary team --Renal function is normal, hopefully resume home diuretics soon. -- Holding off on diagnostic paracentesis for now.   --In light of ascites and probable intermittent gastrointestinal bleeding will start empiric Rocephin .  --Blood transfusion has already been ordered --Continue home Coreg  --Given C. difficile infection I am going to hold his PPI for now.  Will replace with  famotidine .  -- Will advance from clear liquids to 2 g sodium diet -- Adding INR to today's labs (if not already done) -- Once diarrhea resolves we can resume lactulose . --Home med list reviewed - patient on Embrel Q week. If so, this needs to be held for a couple of weeks given C-diff infection.    HPI   Brief History:   Robert Greene is a 63 year old male with a history of decompensated alcohol related cirrhosis.  He has a history of esophageal variceal bleeding s/p banding.  We have seen him in the hospital several times, most recently he was seen by us  early November for evaluation of melena and coffee-ground emesis.SABRA  He had ongoing alcohol use. EGD during that admission showed grade 1 esophageal varices, nonbleeding.  He did have portal hypertensive gastropathy which was felt to be the source of the coffee-ground emesis and melena..  He was continued on carvedilol , advised to discontinue alcohol.  On admission he was encephalopathic which was felt to be a commendation of alcohol withdrawal and hepatic encephalopathy.  He was advised to continue lactulose  upon discharge.  He was discharged home on 12/03/2023  Interval History:  Patient readmitted yesterday with nausea, vomiting and diarrhea.  He says his symptoms started shortly after he was discharged from the hospital in November.  He describes having multiple episodes of loose stool a day.  He has not been taking lactulose  nor most of his home medications due to the vomiting.SABRA  He describes onset of black diarrhea couple of weeks ago.  He tells me that his stool changes colors, sometimes is yellow, sometimes black. He attributes onset of GI symptoms to a hamburger and some potato chips he consumed.    On admissions patient's hemoglobin was 8.1, unchanged from November labs.  However his hemoglobin has slowly drifted to 6.9.  Lactic acid mildly elevated but has now normalized..    Robert Greene's stool has  tested positive for C. difficile today.    Pertinent GI Studies   Most recent EGD 11/28/23 - Grade I esophageal varices. Not a cause of bleeding - Portal hypertensive gastropathy. Suspect this was cause of coffee ground emesis and melena - Duodenal deformity. Submucosal polypoid lesion - soft and looks benign. Previously seen and unchanged. Looks innocent - The examination was otherwise normal. - No specimens collected. I called and updated his sonDevario, Robert Greene  Labs and Imaging:  Recent Labs    12/31/23 2334 01/01/24 0819 01/02/24 0706  PROT 6.7 6.3* 5.4*  ALBUMIN  3.1* 2.9* 2.5*  AST 81* 73* 49*  ALT 33 31 23  ALKPHOS 138* 122 107  BILITOT 2.5* 2.4* 2.1*   Recent Labs    12/31/23 2334 01/01/24 0819 01/02/24 0706  WBC 4.1 4.6 3.8*  HGB 8.1* 7.5* 6.9*  HCT 26.2* 25.1* 22.2*  MCV 76.2* 77.7* 76.3*  PLT 51* 49* 41*   Recent Labs    12/31/23 2334 01/01/24 0819 01/02/24 0706  NA 138 135 135  K 2.9* 3.2* 2.9*  CL 106 104 108  CO2 22 13* 20*  GLUCOSE 89 99 86  BUN 8 9 7*  CREATININE 0.81 0.79 0.74  CALCIUM  8.6* 7.8* 7.7*     CT ABDOMEN PELVIS W CONTRAST EXAM: CT ABDOMEN AND PELVIS WITH CONTRAST 01/01/2024 06:13:43 AM  TECHNIQUE: CT of the abdomen and pelvis was performed with the administration of intravenous contrast. 75 mL of iohexol  (OMNIPAQUE ) 350 MG/ML injection was administered. Multiplanar reformatted images are provided for review. Automated exposure control, iterative reconstruction, and/or weight-based adjustment of the mA/kV was utilized to reduce the radiation dose to as low as reasonably achievable.  COMPARISON: CT with IV contrast 10/10/2023 and 02/27/2023.  CLINICAL HISTORY: Diarrhea; Vomiting, bilious (Ped 1-17y). Patient has a known history of cirrhosis and steatosis of the liver.  FINDINGS:  LOWER CHEST: Asymmetric subpleural fibrosis in the base of the right middle and lower lobes, subpleural reticulation posteriorly in the left  lower lobe. Lung bases are unchanged. The cardiac size is normal.  LIVER: There is a cirrhotic liver configuration with mild to moderate general steatosis and capsular nodularity. No liver mass is seen.  GALLBLADDER AND BILE DUCTS: There is a cluster of 3 or 4 small subcentimeter stones in the proximal gallbladder but no wall thickening or biliary dilatation.  SPLEEN: There is mild splenomegaly with an AP dimension of 14.9 cm. There is no mass.  PANCREAS: No acute abnormality.  ADRENAL GLANDS: No acute abnormality. There is no adrenal mass  KIDNEYS, URETERS AND BLADDER: No stones in the kidneys or ureters. No hydronephrosis. No perinephric or periureteral stranding. Urinary bladder is unremarkable. There is no renal mass.  GI  AND BOWEL: There are thickened folds in the stomach, duodenum, and multiple upper to mid-abdominal small bowel segments, unknown whether this is due to portal hypertension or gastroenteritis.  There is fluid in the colon without wall thickening. There is diverticulosis without diverticulitis. There are mild increased mesenteric congestive changes.  PERITONEUM AND RETROPERITONEUM: Increased mild abdominal and pelvic free ascites, greater in the right hemiabdomen. There is no free hemorrhage, free air, or localizing collection.  VASCULATURE: Aorta is normal in caliber. Moderate to heavy aortoiliac calcific plaques are again seen.  LYMPH NODES: There are slightly prominent periportal lymph nodes, but no new or bulky adenopathy.  REPRODUCTIVE ORGANS: There is an enlarged prostate measuring 4.8 cm transverse.  BONES AND SOFT TISSUES: Degenerative change noted in thoracic and lumbar spine. No acute or significant osseous findings. No focal soft tissue abnormality.  IMPRESSION: 1. Thickened folds in the stomach, duodenum, and multiple upper to mid-abdominal small bowel segments, and in this case could be due to portal hypertension or  gastroenteritis. 2. Increased mild abdominal and pelvic ascites, greater in the right hemiabdomen. Increased mesenteric congestive change. 3. Cirrhotic liver morphology with mild to moderate hepatic steatosis; no focal liver mass identified. Mild splenomegaly. 4. Uncomplicated cholelithiasis. 5. Prostatomegaly.  Electronically signed by: Francis Quam MD 01/01/2024 06:34 AM EST RP Workstation: [id number] DG Chest Portable 1 View EXAM: 1 VIEW(S) XRAY OF THE CHEST 01/01/2024 12:02:59 AM  COMPARISON: AP and lateral chest 12/16/2023.  CLINICAL HISTORY: weakness  FINDINGS:  LUNGS AND PLEURA: The lungs are expiratory with bronchovascular crowding and limited view of the bases. There is perihilar and basal coarse linear markings which are probably due to atelectasis.  There is no convincing focal pneumonia. Postsurgical changes are again noted of the right upper lobe. No pleural effusion. No pneumothorax.  HEART AND MEDIASTINUM: No acute abnormality of the cardiac and mediastinal silhouettes.  BONES AND SOFT TISSUES: Thoracic spondylosis.  IMPRESSION: 1. No acute cardiopulmonary process identified. 2. Expiratory film with bronchovascular crowding and limited basal evaluation. 3. Perihilar and basal coarse linear markings, likely atelectasis. 4. Postsurgical changes in the right upper lobe.  Electronically signed by: Francis Quam MD 01/01/2024 12:08 AM EST RP Workstation: [id number]     Past Medical History:  Diagnosis Date   Alcoholism (HCC)    Anxiety    Arthritis    rheumatoid   Cirrhosis (HCC)    Coronary artery calcification seen on CAT scan 04/08/2018   Dyspnea    HTN (hypertension) 04/08/2018   Hypercholesteremia    Hypertension    Interstitial lung disease (HCC)    Kidney stones 2020   Pulmonary fibrosis (HCC)    Rheumatoid arthritis (HCC)     Past Surgical History:  Procedure Laterality Date   COLONOSCOPY N/A 08/03/2023   Procedure: COLONOSCOPY;   Surgeon: Wilhelmenia Aloha Raddle., MD;  Location: [facility];  Service: Gastroenterology;  Laterality: N/A;   ESOPHAGOGASTRODUODENOSCOPY N/A 08/03/2023   Procedure: EGD (ESOPHAGOGASTRODUODENOSCOPY);  Surgeon: Wilhelmenia Aloha Raddle., MD;  Location: [facility];  Service: Gastroenterology;  Laterality: N/A;   ESOPHAGOGASTRODUODENOSCOPY N/A 09/16/2023   Procedure: EGD (ESOPHAGOGASTRODUODENOSCOPY);  Surgeon: Nandigam, Kavitha V, MD;  Location: [facility];  Service: Gastroenterology;  Laterality: N/A;   ESOPHAGOGASTRODUODENOSCOPY N/A 11/28/2023   Procedure: EGD (ESOPHAGOGASTRODUODENOSCOPY);  Surgeon: Avram Lupita BRAVO, MD;  Location: [facility];  Service: Gastroenterology;  Laterality: N/A;   KNEE CARTILAGE SURGERY Left    x2   LUNG BIOPSY Right 12/06/2017   Procedure: LUNG BIOPSY;  Surgeon: Kerrin Elspeth BROCKS, MD;  Location: [facility] OR;  Service: Thoracic;  Laterality: Right;   VIDEO ASSISTED THORACOSCOPY Right 12/06/2017   Procedure: VIDEO ASSISTED THORACOSCOPY;  Surgeon: Kerrin Elspeth BROCKS, MD;  Location: [facility] OR;  Service: Thoracic;  Laterality: Right;    Family History  Problem Relation Age of Onset   Cirrhosis Father    Colon cancer Neg Hx    Stomach cancer Neg Hx    Esophageal cancer Neg Hx    Inflammatory bowel disease Neg Hx    Liver disease Neg Hx    Pancreatic cancer Neg Hx    Rectal cancer Neg Hx     Prior to Admission medications   Medication Sig Start Date End Date Taking? Authorizing Provider  carvedilol  (COREG ) 3.125 MG tablet Take 1 tablet (3.125 mg total) by mouth 2 (two) times daily with a meal. 09/17/23  Yes Rosario Leatrice FERNS, MD  etanercept (ENBREL) 50 MG/ML injection Inject 50 mg into the skin every Saturday.   Yes [provider]  folic acid  (FOLVITE ) 1 MG tablet Take 1 tablet (1 mg total) by mouth daily. 09/18/23  Yes Rosario Leatrice FERNS, MD  furosemide  (LASIX ) 20 MG tablet Take 1 tablet (20 mg total) by mouth daily as needed for fluid or edema. 09/20/23   Yes Sebastian Toribio GAILS, MD  lactulose  (CHRONULAC ) 10 GM/15ML solution Take 30 mLs (20 g total) by mouth 2 (two) times daily. 12/03/23  Yes Sebastian Toribio GAILS, MD  pantoprazole  (PROTONIX ) 40 MG tablet Take 1 tablet (40 mg total) by mouth daily before breakfast. 12/04/23  Yes Sebastian Toribio GAILS, MD  Thiamine  HCl (B-1) 100 MG TABS Take 1 tablet by mouth daily. 11/10/23  Yes [provider]  triamcinolone  cream (KENALOG) 0.1 % Apply 1 Application topically 2 (two) times daily. 12/07/23  Yes [provider]  albuterol  (VENTOLIN  HFA) 108 (90 Base) MCG/ACT inhaler Inhale 1-2 puffs into the lungs every 6 (six) hours as needed for wheezing or shortness of breath. Patient not taking: Reported on 01/01/2024 11/09/23   Henderly, Britni A, PA-C  EPINEPHrine  0.3 mg/0.3 mL IJ SOAJ injection Use once.  If ineffective, use 2nd dose. Patient not taking: Reported on 01/01/2024 11/19/16   Dean Clarity, MD  hydroxychloroquine  (PLAQUENIL ) 200 MG tablet Take 200 mg by mouth daily. Patient not taking: Reported on 01/01/2024 02/06/23   [provider]  losartan  (COZAAR ) 100 MG tablet Take 100 mg by mouth daily. 08/26/23   [provider]    Current Facility-Administered Medications  Medication Dose Route Frequency Provider Last Rate Last Admin   0.9 %  sodium chloride  infusion (Manually program via Guardrails IV Fluids)   Intravenous Once Hongalgi, Anand D, MD       acetaminophen  (TYLENOL ) tablet 650 mg  650 mg Oral Q6H PRN Thomas, Sara-Maiz A, MD   650 mg at 01/01/24 9082   Or   acetaminophen  (TYLENOL ) suppository 650 mg  650 mg Rectal Q6H PRN Debby Camila LABOR, MD       albuterol  (PROVENTIL ) (2.5 MG/3ML) 0.083% nebulizer solution 2.5 mg  2.5 mg Nebulization Q2H PRN Debby Camila LABOR, MD       carvedilol  (COREG ) tablet 3.125 mg  3.125 mg Oral BID WC Gherghe, Costin M, MD   3.125 mg at 01/02/24 0759   folic acid  (FOLVITE ) tablet 1 mg  1 mg Oral Daily Thomas, Sara-Maiz A, MD   1 mg  at 01/02/24 0802   ondansetron  (ZOFRAN ) tablet 4 mg  4 mg Oral Q6H PRN Debby Camila LABOR,  MD       Or   ondansetron  (ZOFRAN ) injection 4 mg  4 mg Intravenous Q6H PRN Debby Camila LABOR, MD       pantoprazole  (PROTONIX ) injection 40 mg  40 mg Intravenous Q12H Hongalgi, Anand D, MD       thiamine  (VITAMIN B1) tablet 100 mg  100 mg Oral Daily Thomas, Sara-Maiz A, MD   100 mg at 01/02/24 9197   vancomycin  (VANCOCIN ) capsule 125 mg  125 mg Oral QID Gherghe, Costin M, MD   125 mg at 01/02/24 0800    Allergies as of 12/31/2023 - Review Complete 12/31/2023  Allergen Reaction Noted   Bee venom Anaphylaxis, Hives, and Rash 07/25/2013   Spider antivenin [s black widow (latrodec mactans) antivenin] Anaphylaxis, Hives, and Rash 05/12/2013    Social History   Socioeconomic History   Marital status: Widowed    Spouse name: Not on file   Number of children: 2   Years of education: Not on file   Highest education level: Not on file  Occupational History   Not on file  Tobacco Use   Smoking status: Every Day    Current packs/day: 1.50    Average packs/day: 1.5 packs/day for 40.0 years (60.0 ttl pk-yrs)    Types: Cigarettes   Smokeless tobacco: Never   Tobacco comments:    Pt states he does smoke every now and then but not much. 02/14/22 ALS   Vaping Use   Vaping status: Every Day  Substance and Sexual Activity   Alcohol use: Not Currently   Drug use: No   Sexual activity: Yes    Birth control/protection: None  Other Topics Concern   Not on file  Social History Narrative   Not on file   Social Drivers of Health   Financial Resource Strain: Not on file  Food Insecurity: No Food Insecurity (01/01/2024)   Hunger Vital Sign    Worried About Running Out of Food in the Last Year: Never true    Ran Out of Food in the Last Year: Never true  Transportation Needs: No Transportation Needs (01/01/2024)   PRAPARE - Administrator, Civil Service (Medical): No    Lack of  Transportation (Non-Medical): No  Physical Activity: Not on file  Stress: Not on file  Social Connections: Not on file  Intimate Partner Violence: Not At Risk (01/01/2024)   Humiliation, Afraid, Rape, and Kick questionnaire    Fear of Current or Ex-Partner: No    Emotionally Abused: No    Physically Abused: No    Sexually Abused: No     Code Status   Code Status: Full Code  Review of Systems: All systems reviewed and negative except where noted in HPI.  Physical Exam: Vital signs in last 24 hours: Temp:  [98 F (36.7 C)-99.9 F (37.7 C)] 98.6 F (37 C) (12/10 0738) Pulse Rate:  [78-98] 96 (12/10 0738) Resp:  [18-20] 20 (12/10 0738) BP: (118-145)/(60-91) 144/91 (12/10 0738) SpO2:  [98 %-100 %] 98 % (12/10 0738) Last BM Date : 01/01/24  General:  Pleasant male in NAD Psych:  Cooperative. Normal mood and affect Eyes: Pupils equal Ears:  Normal auditory acuity Nose: No deformity, discharge or lesions Neck:  Supple, no masses felt Lungs:  Clear to auscultation.  Heart:  Regular rate, regular rhythm.  Abdomen:  Soft, moderate to largely distended , nontender, active bowel sounds, no masses felt Rectal :  Deferred Msk: Symmetrical without gross deformities.  Neurologic:  Alert, oriented, grossly normal neurologically.  No asterixis Extremities : 1+ BLE edema Skin:  Intact without significant lesions.    Intake/Output from previous day: 12/09 0701 - 12/10 0700 In: 2252.6 [I.V.:2252.6] Out: 125 [Urine:125] Intake/Output this shift:  No intake/output data recorded.   Vina Dasen, NP-C   01/02/2024, 10:59 AM

## 2024-01-02 NOTE — Progress Notes (Signed)
 Transition of Care [facility]) - Inpatient Brief Assessment   Patient Details  Name: Robert Greene MRN: [id number] Date of Birth: Jun 05, 1960  [facility] CM/SW Contact:    Rosaline JONELLE Joe, RN Phone Number: 01/02/2024, 3:20 PM   Clinical Narrative: CM met with the patient at the bedside to discuss inpatient care management needs.  The patient lives alone and plans to return home when medically stable.  Patient admitted to the hospital for gastroenteritis/ c-diff and is currently receiving PRBC and antibiotics.  DME at the home includes [employer].    Patient states that he is now disabled and currently drives to appointments in the community.  Patient was recently active with [facility] - but is no longer active with services.  Patient is pending Pt/OT evaluation at this time.  Patient faxed out in the hub - pending PT/OT eval.  Patient states that he was pleased with [facility] and would request return of services if recommended post-therapy evaluation.  Patient with history of ETOH use.  Patient states that he last consumed ETOH more than 1 month ago.  Patient is a current smoker of cigarettes - resources provided in the AVS.  Patient declines any drug use.  RNCM will continue to follow the patient for likely need for home heath prior to discharge.  Patient has available family to provide transportation to home when stable for discharge.   Transition of Care Asessment: Insurance and Status: (P) Insurance coverage has been reviewed Patient has primary care physician: (P) Yes Home environment has been reviewed: (P) from home alone Prior level of function:: (P) self Prior/Current Home Services: (P) No current home services (Patient was recently active with [facility] - not now per patient) Social Drivers of Health Review: (P) SDOH reviewed needs interventions Readmission risk has been reviewed: (P) Yes Transition of care needs: (P) transition of care  needs identified, TOC will continue to follow

## 2024-01-02 NOTE — Evaluation (Signed)
 Physical Therapy Evaluation Patient Details Name: Robert Greene MRN: [id number] DOB: 30-Apr-1960 Today's Date: 01/02/2024  History of Present Illness  63 y.o. male presents to [facility] hospital on 12/31/2023 with profuse diarrhea for one week. Positive for Cdiff. PMH includes ILD, HTN, cirrhosis with ascites, RA, thrombocytopenia.  Clinical Impression  Pt presents to PT with deficits in functional mobility, gait, balance, power, endurance. Pt is able to ambulate for household distances with PRN UE support of furniture. Pt reports he had been feeling very weak prior to admission  but this has been improving since arrival to the hospital. Pt will benefit from frequent mobilization in an efrort to improve activity tolerance and to reduce falls risk. PT recommends HHPT at the time of discharge.        If plan is discharge home, recommend the following: A little help with walking and/or transfers;A little help with bathing/dressing/bathroom;Assistance with cooking/housework;Assist for transportation;Help with stairs or ramp for entrance   Can travel by private vehicle        Equipment Recommendations None recommended by PT  Recommendations for Other Services       Functional Status Assessment Patient has had a recent decline in their functional status and demonstrates the ability to make significant improvements in function in a reasonable and predictable amount of time.     Precautions / Restrictions Precautions Precautions: Fall Recall of Precautions/Restrictions: Intact Restrictions Weight Bearing Restrictions Per Provider Order: No      Mobility  Bed Mobility Overal bed mobility: Needs Assistance Bed Mobility: Supine to Sit     Supine to sit: Supervision          Transfers Overall transfer level: Needs assistance Equipment used: None Transfers: Sit to/from Stand Sit to Stand: Supervision                Ambulation/Gait Ambulation/Gait assistance: Contact guard  assist Gait Distance (Feet): 80 Feet Assistive device:  (PRN support of furniture) Gait Pattern/deviations: Step-through pattern, Wide base of support Gait velocity: reduced Gait velocity interpretation: <1.8 ft/sec, indicate of risk for recurrent falls   General Gait Details: slowed step-through gait, pt intermittently holding onto furniture for support  Stairs            Wheelchair Mobility     Tilt Bed    Modified Rankin (Stroke Patients Only)       Balance Overall balance assessment: Needs assistance Sitting-balance support: No upper extremity supported, Feet supported Sitting balance-Leahy Scale: Good     Standing balance support: Single extremity supported, Reliant on assistive device for balance Standing balance-Leahy Scale: Poor                               Pertinent Vitals/Pain Pain Assessment Pain Assessment: No/denies pain    Home Living Family/patient expects to be discharged to:: Private residence Living Arrangements: Alone Available Help at Discharge: Family;Available PRN/intermittently Type of Home: House Home Access: Stairs to enter Entrance Stairs-Rails: Right Entrance Stairs-Number of Steps: 3   Home Layout: One level Home Equipment: Educational Psychologist (4 wheels);Cane - single point;Grab bars - tub/shower      Prior Function Prior Level of Function : Independent/Modified Independent             Mobility Comments: pt reports recently returning to ambulation without DME       Extremity/Trunk Assessment   Upper Extremity Assessment Upper Extremity Assessment: Overall WFL for tasks assessed  Lower Extremity Assessment Lower Extremity Assessment: Generalized weakness    Cervical / Trunk Assessment Cervical / Trunk Assessment: Normal  Communication   Communication Communication: No apparent difficulties Factors Affecting Communication: Reduced clarity of speech    Cognition Arousal: Alert Behavior During  Therapy: WFL for tasks assessed/performed   PT - Cognitive impairments: No family/caregiver present to determine baseline, Problem solving, Awareness                       PT - Cognition Comments: pt's current awareness of the medical situation appears impaired. Pt also reports a provider performed a digital rectal exam yesterday in order to test his stool for blood. Following commands: Intact       Cueing Cueing Techniques: Verbal cues     General Comments General comments (skin integrity, edema, etc.): pt in NAD. After initial sit to stand PT notes drops of blood on the floor. Pt's IV line had come unscrewed  and blood was backflowing out of the IV catheter into the floor. PT assisted by applying a washcloth to the site while also obtaining assistance from nursing staff who were able to re-connect the IV line.    Exercises     Assessment/Plan    PT Assessment Patient needs continued PT services  PT Problem List Decreased strength;Decreased activity tolerance;Decreased balance;Decreased mobility;Decreased cognition;Decreased knowledge of use of DME;Decreased safety awareness;Decreased knowledge of precautions       PT Treatment Interventions DME instruction;Gait training;Functional mobility training;Stair training;Therapeutic activities;Therapeutic exercise;Balance training;Neuromuscular re-education;Cognitive remediation;Patient/family education    PT Goals (Current goals can be found in the Care Plan section)  Acute Rehab PT Goals Patient Stated Goal: to return home PT Goal Formulation: With patient Time For Goal Achievement: 01/16/24 Potential to Achieve Goals: Fair    Frequency Min 2X/week     Co-evaluation               AM-PAC PT 6 Clicks Mobility  Outcome Measure Help needed turning from your back to your side while in a flat bed without using bedrails?: A Little Help needed moving from lying on your back to sitting on the side of a flat bed without  using bedrails?: A Little Help needed moving to and from a bed to a chair (including a wheelchair)?: A Little Help needed standing up from a chair using your arms (e.g., wheelchair or bedside chair)?: A Little Help needed to walk in hospital room?: A Little Help needed climbing 3-5 steps with a railing? : A Lot 6 Click Score: 17    End of Session Equipment Utilized During Treatment: Gait belt Activity Tolerance: Patient tolerated treatment well Patient left: in bed;with call bell/phone within reach;with bed alarm set Nurse Communication: Mobility status PT Visit Diagnosis: Other abnormalities of gait and mobility (R26.89);Muscle weakness (generalized) (M62.81)    Time: 1540-1610 PT Time Calculation (min) (ACUTE ONLY): 30 min   Charges:   PT Evaluation $PT Eval Low Complexity: 1 Low   PT General Charges $$ ACUTE PT VISIT: 1 Visit         [name], PT, DPT Acute Rehabilitation Office [phone]   [name] 01/02/2024, 4:47 PM

## 2024-01-02 NOTE — Progress Notes (Signed)
 PROGRESS NOTE   Robert Greene  FMW:[id number]    DOB: 1960-02-08    DOA: 12/31/2023  PCP: [name], MD   I have briefly reviewed patients previous medical [facility] in [facility].   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  He ruled in for C. difficile.  Course complicated by reported intermittent dark stools, worsening anemia.  [facility] consulted.   Assessment & Plan:   C. difficile colitis - Stool C. difficile antigen and toxin positive. - Suspected due to recent hospitalization and antibiotic use for SBP prophylaxis in the context of GI bleed. - [facility] input appreciated.  They feel that his intermittent dark stools is likely related to intermittent bleeding from portal hypertensive gastropathy but DRE by them on 12/10 showed yellow stools. - Continue oral vancomycin .  PPI transitioned to Pepcid .  Anemia due to chronic blood loss and hepatic cirrhosis Pancytopenia Recent baseline hemoglobin in the 8 g range.  Platelets were 119 on 11/23. Hemoglobin drifted down to 6.9 on 12/10, suspected due to slow intermittent bleeding from portal hypertensive gastropathy. As per [facility] evaluation, DRE on 12/10 showed yellow stools.  Holding off on EGD at this time. Transfuse 1 unit PRBC and follow CBC.  Aim to keep hemoglobin just above 7 g per DL and not over transfuse  due to risk of variceal bleeding. On empiric IV ceftriaxone  for SBP prophylaxis Follow daily CBCs  Alcohol-related cirrhosis complicated by - Esophageal varices s/p previous banding - Portal hypertensive gastropathy - Hepatic encephalopathy, prior - Ascites - Pancytopenia - Mild splenomegaly - As per [facility] MD note yesterday, patient claimed to have consumed alcohol 5 days PTA but his report to [facility] today is that he has not had alcohol for a long time and to this MD he stated that he had not drank alcohol in more than a month.  Therefore not really sure when last he drank alcohol but absolute alcohol cessation was been reiterated. -Continue low-dose carvedilol  for his esophageal varices and portal hypertensive gastropathy history.  Holding lactulose  due to ongoing diarrhea from C. difficile. - Although he has tappable ascites, GI holding off on paracentesis due to significant thrombocytopenia and risk for bleeding complications but covering with IV ceftriaxone  for SBP prophylaxis. - CT A/P results from 10/9 appreciated.  Hypokalemia Hypomagnesemia Hypophosphatemia NAGMA - Replace aggressively and follow - Likely related to ongoing diarrhea  Essential hypertension - Controlled.  Continue low-dose carvedilol  3.125 mg twice daily.  Holding Lasix  and losartan .  Rheumatoid arthritis ILD - Holding Enbrel given active C. difficile infection.  He apparently does not take Plaquenil .   Body mass index is 29.3 kg/m.   DVT prophylaxis: SCDs Start: 01/01/24 0535     Code Status: Full Code:  Family Communication: None at bedside Disposition:  Status is: Inpatient Remains inpatient appropriate because: Anemia requiring blood transfusion, ongoing profuse diarrhea from C. difficile, multiple electrolyte abnormalities requiring IV replacement and follow-up     Consultants:   [facility]  Procedures:     Subjective:  Patient reports that he had 5 BMs overnight and 3 BMs this  morning, small-volume but watery.  Some epigastric discomfort but no nausea or vomiting.  Reports that the stools were dark.  Denied red blood in the stools.  No chest pain, dyspnea, dizziness or lightheadedness.  Objective:   Vitals:   01/02/24 0738 01/02/24 1115 01/02/24 1134 01/02/24 1149  BP: (!) 144/91 129/65 129/65 118/60  Pulse: 96 86 86 81  Resp: 20 19 19 19   Temp: 98.6 F (37 C) 98.9 F (37.2 C) 98.9 F (37.2 C) 99 F (37.2 C)  TempSrc: Oral Oral Oral   SpO2: 98% 99%    Height:        General exam: Young male, moderately built and nourished lying comfortably propped up in bed without distress. Respiratory system: Clear to auscultation. Respiratory effort normal. Cardiovascular system: S1 & S2 heard, RRR. No JVD, murmurs, rubs, gallops or clicks. No pedal edema. Gastrointestinal system: Abdomen is distended but not tense, soft and nontender. No organomegaly or masses felt. Normal bowel sounds heard.  Ascites ++ Central nervous system: Alert and oriented. No focal neurological deficits. Extremities: Symmetric 5 x 5 power. Skin: No rashes, lesions or ulcers Psychiatry: Judgement and insight appear normal. Mood & affect appropriate.     Data Reviewed:   I have personally reviewed following labs and imaging studies   CBC: Recent Labs  Lab 12/31/23 2334 01/01/24 0819 01/02/24 0706  WBC 4.1 4.6 3.8*  NEUTROABS 2.1 2.5  --   HGB 8.1* 7.5* 6.9*  HCT 26.2* 25.1* 22.2*  MCV 76.2* 77.7* 76.3*  PLT 51* 49* 41*    Basic Metabolic Panel: Recent Labs  Lab 12/31/23 2334 01/01/24 0819 01/02/24 0706  NA 138 135 135  K 2.9* 3.2* 2.9*  CL 106 104 108  CO2 22 13* 20*  GLUCOSE 89 99 86  BUN 8 9 7*  CREATININE 0.81 0.79 0.74  CALCIUM  8.6* 7.8* 7.7*  MG  --  1.6* 1.4*  PHOS  --  2.0* 1.5*    Liver Function Tests: Recent Labs  Lab 12/31/23 2334 01/01/24 0819 01/02/24 0706  AST 81* 73* 49*  ALT 33 31 23  ALKPHOS 138* 122 107  BILITOT 2.5* 2.4* 2.1*  PROT 6.7  6.3* 5.4*  ALBUMIN  3.1* 2.9* 2.5*    CBG: No results for input(s): GLUCAP in the last 168 hours.  Microbiology Studies:   Recent Results (from the past 240 hours)  C Difficile Quick Screen w PCR reflex     Status: Abnormal   Collection Time: 01/01/24  1:56 AM   Specimen: STOOL  Result Value Ref Range Status   C Diff antigen POSITIVE (A) NEGATIVE Final   C Diff toxin POSITIVE (A) NEGATIVE Final   C Diff interpretation Toxin producing C. difficile detected.  Final    Comment: called 3 diff numbers couldn't get ahold of Lobbyist Performed at [facility], [address] [place], [place] [address]     Radiology Studies:  CT ABDOMEN PELVIS W CONTRAST Result Date: 01/01/2024 EXAM: CT ABDOMEN AND PELVIS WITH CONTRAST 01/01/2024 06:13:43 AM TECHNIQUE: CT of the abdomen and pelvis was performed with the administration of intravenous contrast. 75 mL of iohexol  (OMNIPAQUE ) 350 MG/ML injection was administered. Multiplanar reformatted images are provided for review. Automated exposure control, iterative reconstruction, and/or weight-based adjustment of the mA/kV was utilized to reduce the radiation dose to as low as reasonably  achievable. COMPARISON: CT with IV contrast 10/10/2023 and 02/27/2023. CLINICAL HISTORY: Diarrhea; Vomiting, bilious (Ped 1-17y). Patient has a known history of cirrhosis and steatosis of the liver. FINDINGS: LOWER CHEST: Asymmetric subpleural fibrosis in the base of the right middle and lower lobes, subpleural reticulation posteriorly in the left lower lobe. Lung bases are unchanged. The cardiac size is normal. LIVER: There is a cirrhotic liver configuration with mild to moderate general steatosis and capsular nodularity. No liver mass is seen. GALLBLADDER AND BILE DUCTS: There is a cluster of 3 or 4 small subcentimeter stones in the proximal gallbladder but no wall thickening or biliary dilatation. SPLEEN: There is mild splenomegaly with an AP dimension of 14.9 cm.  There is no mass. PANCREAS: No acute abnormality. ADRENAL GLANDS: No acute abnormality. There is no adrenal mass KIDNEYS, URETERS AND BLADDER: No stones in the kidneys or ureters. No hydronephrosis. No perinephric or periureteral stranding. Urinary bladder is unremarkable. There is no renal mass. GI AND BOWEL: There are thickened folds in the stomach, duodenum, and multiple upper to mid-abdominal small bowel segments, unknown whether this is due to portal hypertension or gastroenteritis. There is fluid in the colon without wall thickening. There is diverticulosis without diverticulitis. There are mild increased mesenteric congestive changes. PERITONEUM AND RETROPERITONEUM: Increased mild abdominal and pelvic free ascites, greater in the right hemiabdomen. There is no free hemorrhage, free air, or localizing collection. VASCULATURE: Aorta is normal in caliber. Moderate to heavy aortoiliac calcific plaques are again seen. LYMPH NODES: There are slightly prominent periportal lymph nodes, but no new or bulky adenopathy. REPRODUCTIVE ORGANS: There is an enlarged prostate measuring 4.8 cm transverse. BONES AND SOFT TISSUES: Degenerative change noted in thoracic and lumbar spine. No acute or significant osseous findings. No focal soft tissue abnormality. IMPRESSION: 1. Thickened folds in the stomach, duodenum, and multiple upper to mid-abdominal small bowel segments, and in this case could be due to portal hypertension or gastroenteritis. 2. Increased mild abdominal and pelvic ascites, greater in the right hemiabdomen. Increased mesenteric congestive change. 3. Cirrhotic liver morphology with mild to moderate hepatic steatosis; no focal liver mass identified. Mild splenomegaly. 4. Uncomplicated cholelithiasis. 5. Prostatomegaly. Electronically signed by: Francis Quam MD 01/01/2024 06:34 AM EST RP Workstation: [id number]   DG Chest Portable 1 View Result Date: 01/01/2024 EXAM: 1 VIEW(S) XRAY OF THE CHEST 01/01/2024  12:02:59 AM COMPARISON: AP and lateral chest 12/16/2023. CLINICAL HISTORY: weakness FINDINGS: LUNGS AND PLEURA: The lungs are expiratory with bronchovascular crowding and limited view of the bases. There is perihilar and basal coarse linear markings which are probably due to atelectasis. There is no convincing focal pneumonia. Postsurgical changes are again noted of the right upper lobe. No pleural effusion. No pneumothorax. HEART AND MEDIASTINUM: No acute abnormality of the cardiac and mediastinal silhouettes. BONES AND SOFT TISSUES: Thoracic spondylosis. IMPRESSION: 1. No acute cardiopulmonary process identified. 2. Expiratory film with bronchovascular crowding and limited basal evaluation. 3. Perihilar and basal coarse linear markings, likely atelectasis. 4. Postsurgical changes in the right upper lobe. Electronically signed by: Francis Quam MD 01/01/2024 12:08 AM EST RP Workstation: [id number]    Scheduled Meds:    carvedilol   3.125 mg Oral BID WC   famotidine   20 mg Oral BID   folic acid   1 mg Oral Daily   potassium chloride   40 mEq Oral Q4H   thiamine   100 mg Oral Daily   vancomycin   125 mg Oral QID    Continuous Infusions:    cefTRIAXone  (ROCEPHIN )  IV  2 g (01/02/24 1200)   magnesium  sulfate bolus IVPB       LOS: 1 day     Trenda Mar, MD,  FACP, [facility], [facility], [facility]   [facility] [place]      To contact the attending provider between 7A-7P or the covering provider during after hours 7P-7A, please log into the web site [email or web address] and access using universal [place] password for that web site. If you do not have the password, please call the hospital operator.  01/02/2024, 12:34 PM

## 2024-01-02 NOTE — Progress Notes (Signed)
 [facility] night cross cover note:   Patient conveying anxiety, requesting medication for this. Most recent EKG showed borderline qtc prolongation. I subsequently ordered Ativan  0.5 mg IV x 1 dose prn for anxiety.    Eva Pore, DO Hospitalist

## 2024-01-02 NOTE — Progress Notes (Signed)
 PT Cancellation Note  Patient Details Name: Robert Greene MRN: [id number] DOB: 1960/05/13   Cancelled Treatment:    Reason Eval/Treat Not Completed: Medical issues which prohibited therapy. RN starting blood transfusion upon PT arrival for evaluation. PT will attempt to follow up after transfusion is complete.   [name] 01/02/2024, 11:33 AM

## 2024-01-02 NOTE — Progress Notes (Signed)
 Critical lab report of hbg of 6.9 MD made ware. 1 PRBC ordered.

## 2024-01-03 DIAGNOSIS — A0472 Enterocolitis due to Clostridium difficile, not specified as recurrent: Secondary | ICD-10-CM | POA: Diagnosis not present

## 2024-01-03 DIAGNOSIS — R112 Nausea with vomiting, unspecified: Secondary | ICD-10-CM | POA: Diagnosis not present

## 2024-01-03 DIAGNOSIS — K2951 Unspecified chronic gastritis with bleeding: Secondary | ICD-10-CM | POA: Diagnosis not present

## 2024-01-03 DIAGNOSIS — K7031 Alcoholic cirrhosis of liver with ascites: Secondary | ICD-10-CM | POA: Diagnosis not present

## 2024-01-03 LAB — COMPREHENSIVE METABOLIC PANEL WITH GFR
ALT: 21 U/L (ref 0–44)
AST: 40 U/L (ref 15–41)
Albumin: 2.4 g/dL — ABNORMAL LOW (ref 3.5–5.0)
Alkaline Phosphatase: 97 U/L (ref 38–126)
Anion gap: 5 (ref 5–15)
BUN: 7 mg/dL — ABNORMAL LOW (ref 8–23)
CO2: 19 mmol/L — ABNORMAL LOW (ref 22–32)
Calcium: 7.7 mg/dL — ABNORMAL LOW (ref 8.9–10.3)
Chloride: 110 mmol/L (ref 98–111)
Creatinine, Ser: 0.61 mg/dL (ref 0.61–1.24)
GFR, Estimated: >60 mL/min (ref >60)
Glucose, Bld: 71 mg/dL (ref 70–99)
Potassium: 3.4 mmol/L — ABNORMAL LOW (ref 3.5–5.1)
Sodium: 134 mmol/L — ABNORMAL LOW (ref 135–145)
Total Bilirubin: 1.8 mg/dL — ABNORMAL HIGH (ref 0.0–1.2)
Total Protein: 5.4 g/dL — ABNORMAL LOW (ref 6.5–8.1)

## 2024-01-03 LAB — TYPE AND SCREEN
ABO/RH(D): O POS
Antibody Screen: NEGATIVE
Unit division: 0

## 2024-01-03 LAB — BPAM RBC
Blood Product Expiration Date: 202601042359
ISSUE DATE / TIME: 202512101121
Unit Type and Rh: 5100

## 2024-01-03 LAB — CBC
HCT: 24.1 % — ABNORMAL LOW (ref 39.0–52.0)
Hemoglobin: 7.5 g/dL — ABNORMAL LOW (ref 13.0–17.0)
MCH: 24 pg — ABNORMAL LOW (ref 26.0–34.0)
MCHC: 31.1 g/dL (ref 30.0–36.0)
MCV: 77.2 fL — ABNORMAL LOW (ref 80.0–100.0)
Platelets: 40 K/uL — ABNORMAL LOW (ref 150–400)
RBC: 3.12 MIL/uL — ABNORMAL LOW (ref 4.22–5.81)
RDW: 18.9 % — ABNORMAL HIGH (ref 11.5–15.5)
WBC: 3.7 K/uL — ABNORMAL LOW (ref 4.0–10.5)
nRBC: 0.5 % — ABNORMAL HIGH (ref 0.0–0.2)

## 2024-01-03 LAB — PHOSPHORUS: Phosphorus: 2.4 mg/dL — ABNORMAL LOW (ref 2.5–4.6)

## 2024-01-03 LAB — MAGNESIUM: Magnesium: 2 mg/dL (ref 1.7–2.4)

## 2024-01-03 MED ORDER — FUROSEMIDE 20 MG PO TABS
20.0000 mg | ORAL_TABLET | Freq: Every day | ORAL | Status: DC
  Administered 2024-01-03 – 2024-01-04 (×2): 20 mg via ORAL
  Filled 2024-01-03 (×2): qty 1

## 2024-01-03 MED ORDER — SPIRONOLACTONE 25 MG PO TABS
50.0000 mg | ORAL_TABLET | Freq: Every day | ORAL | Status: DC
  Administered 2024-01-03 – 2024-01-04 (×2): 50 mg via ORAL
  Filled 2024-01-03 (×2): qty 2

## 2024-01-03 MED ORDER — POTASSIUM PHOSPHATES 15 MMOLE/5ML IV SOLN
15.0000 mmol | Freq: Once | INTRAVENOUS | Status: AC
  Administered 2024-01-03: 15 mmol via INTRAVENOUS
  Filled 2024-01-03: qty 5

## 2024-01-03 NOTE — Progress Notes (Signed)
 [facility] night cross cover note:  I followed-up one posttransfusion CBC result, which showed hemoglobin of 7.5 relative to pretransfusion H&H of 6.9.  The patient received transfusion of 1 unit PRBC in the interval.  Most recent vital signs appear stable, including heart rates in the 70s, with systolic blood pressures in the 1 teens to 120s mmHg.  Repeat CBC is noted to be ordered with morning labs for further trending of ensuing hemoglobin level.     Eva Pore, DO Hospitalist

## 2024-01-03 NOTE — Evaluation (Signed)
 Occupational Therapy Evaluation Patient Details Name: Robert Greene MRN: [id number] DOB: [birth year] Today's Date: 01/03/2024   History of Present Illness   63 y.o. male presents to [facility] hospital on 12/31/2023 with profuse diarrhea for one week. Positive for Cdiff. PMH includes ILD, HTN, cirrhosis with ascites, RA, thrombocytopenia.     Clinical Impressions Pt up on EOB upon entry, reports just finished showering and dressing. Pt lives alone, PLOF mod I without AD. Pt reports he sometimes has trouble with lower body dressing, but was able to complete independently today sitting EOB. Pt able to stand and ambulate without AD around room as needed, fair balance, no LOB. Pt with good BUE strength and ROM to complete ADLs. Pt spoke at length about what's been going on medically over the last several months, cueing to redirect Pt to activities throughout session. Pt has no acute OT needs, no OT follow up needed. Signing off.      If plan is discharge home, recommend the following:         Functional Status Assessment   Patient has had a recent decline in their functional status and demonstrates the ability to make significant improvements in function in a reasonable and predictable amount of time.     Equipment Recommendations   None recommended by OT     Recommendations for Other Services         Precautions/Restrictions   Precautions Precautions: Fall Recall of Precautions/Restrictions: Intact Restrictions Weight Bearing Restrictions Per Provider Order: No     Mobility Bed Mobility Overal bed mobility: Modified Independent                  Transfers Overall transfer level: Modified independent Equipment used: None               General transfer comment: Pt ambulating around room without assist.      Balance Overall balance assessment: Mild deficits observed, not formally tested                                         ADL  either performed or assessed with clinical judgement   ADL Overall ADL's : Modified independent                                       General ADL Comments: Pt overall mod I for ADLs, was ambulating around room taking care of needs  upon entry. Reported difficulty with LB dressing, but was able to complete without physical assist     Vision Baseline Vision/History: 1 Wears glasses Ability to See in Adequate Light: 0 Adequate Patient Visual Report: No change from baseline       Perception         Praxis         Pertinent Vitals/Pain Pain Assessment Pain Assessment: No/denies pain     Extremity/Trunk Assessment Upper Extremity Assessment Upper Extremity Assessment: Overall WFL for tasks assessed   Lower Extremity Assessment Lower Extremity Assessment: Defer to PT evaluation       Communication Communication Communication: No apparent difficulties   Cognition Arousal: Alert Behavior During Therapy: WFL for tasks assessed/performed Cognition: No family/caregiver present to determine baseline             OT - Cognition Comments: Pt pleasant and conversational.  Following commands: Intact       Cueing  General Comments   Cueing Techniques: Verbal cues      Exercises     Shoulder Instructions      Home Living Family/patient expects to be discharged to:: Private residence Living Arrangements: Alone Available Help at Discharge: Family;Available PRN/intermittently Type of Home: House Home Access: Stairs to enter [employer] of Steps: 3 Entrance Stairs-Rails: Right Home Layout: One level     Bathroom Shower/Tub: Walk-in Pensions Consultant: Standard Bathroom Accessibility: Yes How Accessible: Accessible via walker Home Equipment: Educational Psychologist (4 wheels);Cane - single point;Grab bars - tub/shower          Prior Functioning/Environment Prior Level of Function :  Independent/Modified Independent             Mobility Comments: pt reports recently returning to ambulation without DME ADLs Comments: Indep with ADLs, IADLs in the home. using shower chair    OT Problem List: Decreased activity tolerance;Impaired balance (sitting and/or standing)   OT Treatment/Interventions:        OT Goals(Current goals can be found in the care plan section)   Acute Rehab OT Goals Patient Stated Goal: to return home OT Goal Formulation: With patient Time For Goal Achievement: 01/17/24 Potential to Achieve Goals: Good   OT Frequency:       Co-evaluation              AM-PAC OT 6 Clicks Daily Activity     Outcome Measure Help from another person eating meals?: None Help from another person taking care of personal grooming?: None Help from another person toileting, which includes using toliet, bedpan, or urinal?: None Help from another person bathing (including washing, rinsing, drying)?: None Help from another person to put on and taking off regular upper body clothing?: None Help from another person to put on and taking off regular lower body clothing?: None 6 Click Score: 24   End of Session Nurse Communication: Mobility status  Activity Tolerance: Patient tolerated treatment well Patient left: in bed;with call bell/phone within reach  OT Visit Diagnosis: Other abnormalities of gait and mobility (R26.89);Muscle weakness (generalized) (M62.81)                Time: 1110-1140 OT Time Calculation (min): 30 min Charges:  OT General Charges $OT Visit: 1 Visit OT Evaluation $OT Eval Low Complexity: 1 Low OT Treatments $Self Care/Home Management : 8-22 mins  [place], OTR/L   Elouise JONELLE Bott 01/03/2024, 11:49 AM

## 2024-01-03 NOTE — Plan of Care (Signed)

## 2024-01-03 NOTE — TOC Progression Note (Addendum)
 Transition of Care [facility]) - Progression Note    Patient Details  Name: Robert Greene MRN: [id number] Date of Birth: March 29, 1960  Transition of Care [facility]) CM/SW Contact  Rosaline JONELLE Joe, RN Phone Number: 01/03/2024, 12:19 PM  Clinical Narrative:    CM met with the patient at the bedside and offered Medicare choice regarding home health services and the patient chose [facility].  Baker, RNCM was called and patient was accepted for [facility] RN, PT.  HH orders placed - to be co-signed by the MD.  Patient is aware that home health agency would reach out when patient returns home.                     Expected Discharge Plan and Services                                               Social Drivers of Health (SDOH) Interventions SDOH Screenings   Food Insecurity: No Food Insecurity (01/01/2024)  Housing: Low Risk (01/01/2024)  Transportation Needs: No Transportation Needs (01/01/2024)  Utilities: Not At Risk (01/01/2024)  Tobacco Use: High Risk (12/31/2023)    Readmission Risk Interventions    01/02/2024    3:06 PM  Readmission Risk Prevention Plan  Transportation Screening Complete  Medication Review (RN Care Manager) Complete  PCP or Specialist appointment within 3-5 days of discharge Complete  HRI or Home Care Consult Complete  SW Recovery Care/Counseling Consult Complete  Palliative Care Screening Complete  Skilled Nursing Facility Not Applicable

## 2024-01-03 NOTE — Progress Notes (Signed)
 Physical Therapy Treatment Patient Details Name: Robert Greene MRN: [id number] DOB: Jul 31, 1960 Today's Date: 01/03/2024   History of Present Illness 63 y.o. male presents to [facility] hospital on 12/31/2023 with profuse diarrhea for one week. Positive for Cdiff. PMH includes ILD, HTN, cirrhosis with ascites, RA, thrombocytopenia.    PT Comments  Pt received in supine and agreeable to session. Pt demonstrates improved activity tolerance and stability without UE support this session. Pt able to increase gait distance, but demonstrates DOE requiring seated rest. Pt able to perform x5 STS with BUE progressing to single UE support, but demonstrates increased difficulty with no UE support. Pt reports pattern of poor safety awareness leading to falls in past. Education provided on activity progression, energy conservation, and reducing fall risk with pt verbalizing understanding. Pt continues to benefit from PT services to progress toward functional mobility goals.    If plan is discharge home, recommend the following: A little help with walking and/or transfers;A little help with bathing/dressing/bathroom;Assistance with cooking/housework;Assist for transportation;Help with stairs or ramp for entrance   Can travel by private vehicle        Equipment Recommendations  None recommended by PT    Recommendations for Other Services       Precautions / Restrictions Precautions Precautions: Fall Recall of Precautions/Restrictions: Intact Restrictions Weight Bearing Restrictions Per Provider Order: No     Mobility  Bed Mobility Overal bed mobility: Modified Independent                  Transfers Overall transfer level: Needs assistance Equipment used: None Transfers: Sit to/from Stand Sit to Stand: Supervision                Ambulation/Gait Ambulation/Gait assistance: Contact guard assist Gait Distance (Feet): 170 Feet Assistive device: None Gait Pattern/deviations:  Step-through pattern, Wide base of support       General Gait Details: some instability without UE support, but no overt LOB. CGA for safety   Stairs             Wheelchair Mobility     Tilt Bed    Modified Rankin (Stroke Patients Only)       Balance Overall balance assessment: Needs assistance Sitting-balance support: No upper extremity supported, Feet supported Sitting balance-Leahy Scale: Good     Standing balance support: No upper extremity supported, During functional activity Standing balance-Leahy Scale: Fair Standing balance comment: without UE support                            Communication Communication Communication: No apparent difficulties  Cognition Arousal: Alert Behavior During Therapy: WFL for tasks assessed/performed   PT - Cognitive impairments: No family/caregiver present to determine baseline, Awareness, Safety/Judgement                       PT - Cognition Comments: some decreased safety awareness and awareness of deficits, but appears to be baseline per pt reports Following commands: Intact      Cueing Cueing Techniques: Verbal cues  Exercises Other Exercises Other Exercises: x5 serial STS    General Comments        Pertinent Vitals/Pain Pain Assessment Pain Assessment: No/denies pain     PT Goals (current goals can now be found in the care plan section) Acute Rehab PT Goals Patient Stated Goal: to return home PT Goal Formulation: With patient Time For Goal Achievement: 01/16/24 Progress towards PT goals:  Progressing toward goals    Frequency    Min 2X/week       AM-PAC PT 6 Clicks Mobility   Outcome Measure  Help needed turning from your back to your side while in a flat bed without using bedrails?: None Help needed moving from lying on your back to sitting on the side of a flat bed without using bedrails?: None Help needed moving to and from a bed to a chair (including a wheelchair)?: A  Little Help needed standing up from a chair using your arms (e.g., wheelchair or bedside chair)?: A Little Help needed to walk in hospital room?: A Little Help needed climbing 3-5 steps with a railing? : A Little 6 Click Score: 20    End of Session Equipment Utilized During Treatment: Gait belt Activity Tolerance: Patient tolerated treatment well Patient left: with call bell/phone within reach;in chair Nurse Communication: Mobility status PT Visit Diagnosis: Other abnormalities of gait and mobility (R26.89);Muscle weakness (generalized) (M62.81)     Time: 8499-8478 PT Time Calculation (min) (ACUTE ONLY): 21 min  Charges:    $Gait Training: 8-22 mins PT General Charges $$ ACUTE PT VISIT: 1 Visit                    Darryle George, PTA Acute Rehabilitation Services Secure Chat Preferred  [phone]    Darryle George 01/03/2024, 3:40 PM

## 2024-01-03 NOTE — Progress Notes (Signed)
 PROGRESS NOTE   Robert Greene  FMW:[id number]    DOB: Nov 27, 1960    DOA: 12/31/2023  PCP: [name], MD   I have briefly reviewed patients previous medical records in [facility].   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  He ruled in for C. difficile.  Course complicated by reported intermittent dark stools, worsening anemia.  [facility] consulted.  Diarrhea improved.  Hemoglobin stable posttransfusion.   Assessment & Plan:   C. difficile colitis - Stool C. difficile antigen and toxin positive. - Suspected due to recent hospitalization and antibiotic use for SBP prophylaxis in the context of GI bleed. - [facility] input appreciated.  They feel that his intermittent dark stools is likely related to intermittent bleeding from portal hypertensive gastropathy but DRE by them on 12/10 showed yellow stools. - Continue oral vancomycin .  PPI transitioned to Pepcid .  Improving.  Anemia due to chronic blood loss and hepatic cirrhosis Pancytopenia Recent baseline hemoglobin in the 8 g range.  Platelets were 119 on 11/23. Hemoglobin drifted down to 6.9 on 12/10, suspected due to slow intermittent bleeding from portal hypertensive gastropathy. As per [facility] evaluation, DRE on 12/10 showed yellow stools.  Holding off on EGD at this time. Aim to keep hemoglobin just above 7 g per  DL and not over transfuse due to risk of variceal bleeding. On empiric IV ceftriaxone  for SBP prophylaxis-await GI follow-up to advise choice and duration of antibiotics.  Since platelets still in the 40s, may not be a safe candidate for paracentesis. S/p 1 unit PRBC, hemoglobin up from 6.9 > 7.5 > 7.5.  Follow CBC in AM.  Alcohol-related cirrhosis complicated by - Esophageal varices s/p previous banding - Portal hypertensive gastropathy - Hepatic encephalopathy, prior - Ascites - Pancytopenia - Mild splenomegaly - As per [facility] MD note 12/9, patient claimed to have consumed alcohol 5 days PTA but his report to [facility] today is that he has not had alcohol for a long time and to this MD he stated that he had not drank alcohol in more than a month.  Therefore not really sure when last he drank alcohol but absolute alcohol cessation was been reiterated. -Continue low-dose carvedilol  for his esophageal varices and portal hypertensive gastropathy history.  Holding lactulose  due to ongoing diarrhea from C. difficile. - Although he has tappable ascites, GI holding off on paracentesis due to significant thrombocytopenia and risk for bleeding complications but covering with IV ceftriaxone  for SBP prophylaxis. - CT A/P results from 10/9 appreciated.  Hypokalemia Hypomagnesemia Hypophosphatemia NAGMA - Likely due to diarrhea on admission.  Better.  Continue to replace potassium and phosphorus and follow labs in AM.  Magnesium  normal.  Essential hypertension - Controlled.  Continue low-dose carvedilol  3.125 mg twice daily.  Holding Lasix  and losartan .  Rheumatoid arthritis ILD - Holding Enbrel given active C. difficile infection.  He apparently does not take Plaquenil .   Body mass index is 29.3 kg/m.   DVT prophylaxis: SCDs  Start: 01/01/24 0535     Code Status: Full Code:  Family Communication: None at bedside Disposition:  Status is: Inpatient Remains inpatient appropriate because: Anemia  requiring blood transfusion, ongoing profuse diarrhea from C. difficile, multiple electrolyte abnormalities requiring IV replacement and follow-up   Patient has clinically improved.  Possible DC home tomorrow pending GI clearance.  Therapies recommend home health PT, ordered.  Consultants:   [facility]  Procedures:     Subjective:  He states that overall he is doing much better.  No further diarrhea after 2 BMs on 12/10 morning.  No abdominal pain.  Tolerating diet.  He states that he is alone, independent, has a walker and a wheelchair which he does not use.  Objective:   Vitals:   01/03/24 0011 01/03/24 0401 01/03/24 0815 01/03/24 1146  BP: (!) 113/56 119/71 136/65 114/70  Pulse: 73 69 74 72  Resp: 20 20 18 18   Temp: 98.3 F (36.8 C) 98.9 F (37.2 C) 98.1 F (36.7 C) 98 F (36.7 C)  TempSrc: Oral Oral Oral Oral  SpO2: 98% 99% 99% 99%  Height:        General exam: Young male, moderately built and nourished lying comfortably propped up in bed without distress. Respiratory system: Clear to auscultation. Respiratory effort normal. Cardiovascular system: S1 & S2 heard, RRR. No JVD, murmurs, rubs, gallops or clicks. No pedal edema.  Telemetry personally reviewed: Sinus rhythm with occasional PVCs, discontinued telemetry. Gastrointestinal system: Abdomen is distended but not tense, soft and nontender. No organomegaly or masses felt. Normal bowel sounds heard.  Ascites ++ Central nervous system: Alert and oriented. No focal neurological deficits. Extremities: Symmetric 5 x 5 power. Skin: No rashes, lesions or ulcers Psychiatry: Judgement and insight appear normal. Mood & affect appropriate.     Data Reviewed:   I have personally reviewed following labs and imaging studies   CBC: Recent Labs  Lab 12/31/23 2334 01/01/24 0819 01/02/24 0706 01/02/24 2224 01/03/24 0452  WBC 4.1 4.6 3.8* 4.6 3.7*  NEUTROABS 2.1 2.5  --   --   --   HGB 8.1* 7.5* 6.9* 7.5* 7.5*  HCT 26.2*  25.1* 22.2* 23.7* 24.1*  MCV 76.2* 77.7* 76.3* 77.5* 77.2*  PLT 51* 49* 41* 46* 40*    Basic Metabolic Panel: Recent Labs  Lab 12/31/23 2334 01/01/24 0819 01/02/24 0706 01/03/24 0452  NA 138 135 135 134*  K 2.9* 3.2* 2.9* 3.4*  CL 106 104 108 110  CO2 22 13* 20* 19*  GLUCOSE 89 99 86 71  BUN 8 9 7* 7*  CREATININE 0.81 0.79 0.74 0.61  CALCIUM  8.6* 7.8* 7.7* 7.7*  MG  --  1.6* 1.4* 2.0  PHOS  --  2.0* 1.5* 2.4*    Liver Function Tests: Recent Labs  Lab 12/31/23 2334 01/01/24 0819 01/02/24 0706 01/03/24 0452  AST 81* 73* 49* 40  ALT 33 31 23 21   ALKPHOS 138* 122 107 97  BILITOT 2.5* 2.4* 2.1* 1.8*  PROT 6.7 6.3* 5.4* 5.4*  ALBUMIN  3.1* 2.9* 2.5* 2.4*    CBG: No results for input(s): GLUCAP in the last 168 hours.  Microbiology Studies:   Recent Results (from the past 240 hours)  C Difficile Quick Screen w PCR reflex     Status: Abnormal   Collection Time: 01/01/24  1:56 AM   Specimen: STOOL  Result Value Ref Range Status   C Diff antigen POSITIVE (A) NEGATIVE Final   C Diff toxin POSITIVE (A) NEGATIVE Final  C Diff interpretation Toxin producing C. difficile detected.  Final    Comment: called 3 diff numbers couldn't get ahold of Lobbyist Performed at [facility], [address] [place], [place] [address]   Gastrointestinal Panel by PCR , Stool     Status: None   Collection Time: 01/01/24  1:56 AM   Specimen: Stool  Result Value Ref Range Status   Campylobacter species NOT DETECTED NOT DETECTED Final   Plesimonas shigelloides NOT DETECTED NOT DETECTED Final   Salmonella species NOT DETECTED NOT DETECTED Final   Yersinia enterocolitica NOT DETECTED NOT DETECTED Final   Vibrio species NOT DETECTED NOT DETECTED Final   Vibrio cholerae NOT DETECTED NOT DETECTED Final   Enteroaggregative E coli (EAEC) NOT DETECTED NOT DETECTED Final   Enteropathogenic E coli (EPEC) NOT DETECTED NOT DETECTED Final   Enterotoxigenic E coli (ETEC) NOT DETECTED NOT  DETECTED Final   Shiga like toxin producing E coli (STEC) NOT DETECTED NOT DETECTED Final   Shigella/Enteroinvasive E coli (EIEC) NOT DETECTED NOT DETECTED Final   Cryptosporidium NOT DETECTED NOT DETECTED Final   Cyclospora cayetanensis NOT DETECTED NOT DETECTED Final   Entamoeba histolytica NOT DETECTED NOT DETECTED Final   Giardia lamblia NOT DETECTED NOT DETECTED Final   Adenovirus F40/41 NOT DETECTED NOT DETECTED Final   Astrovirus NOT DETECTED NOT DETECTED Final   Norovirus GI/GII NOT DETECTED NOT DETECTED Final   Rotavirus A NOT DETECTED NOT DETECTED Final   Sapovirus (I, II, IV, and V) NOT DETECTED NOT DETECTED Final    Comment: Performed at [facility], [address], [place], [place] [address]    Radiology Studies:  No results found.   Scheduled Meds:    carvedilol   3.125 mg Oral BID WC   famotidine   20 mg Oral BID   folic acid   1 mg Oral Daily   thiamine   100 mg Oral Daily   vancomycin   125 mg Oral QID    Continuous Infusions:    cefTRIAXone  (ROCEPHIN )  IV 2 g (01/03/24 1235)     LOS: 2 days     Trenda Mar, MD,  FACP, [facility], [facility], [facility]   [facility] Advisor [place]      To contact the attending provider between 7A-7P or the covering provider during after hours 7P-7A, please log into the web site [email or web address] and access using universal [place] password for that web site. If you do not have the password, please call the hospital operator.  01/03/2024, 2:39 PM

## 2024-01-03 NOTE — Progress Notes (Addendum)
 M    Daily Progress Note  DOA: 12/31/2023 Hospital Day: 4  Cc: History decompensated alcoholic cirrhosis, C. 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  Currently admitted with nausea, vomiting, diarrhea (intermittently black) with decline in hemoglobin.  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  He has been diagnosed with C. difficile which is the cause for diarrhea.   No evidence for encephalopathy though he has not been taking lactulose  at home due to diarrhea.  He does have large-volume ascites.  Has not been taking diuretics at home due to vomiting.  TODAY:  Hemoglobin back to baseline at 7.5 with 1 unit RBCs  C-difficile infection.   Diarrhea has already completely resolved on oral vancomycin   Electrolyte abnormalities in setting of GI losses and decreased p.o. intake Electrolyte repletion in progress per primary team   RA Takes Embrel  Diminutive distal colon polyp seen at colonoscopy July 2025-Dr. Mansouraty plans colonoscopy and polypectomy when patient stable   See [facility] for any additional medical history  / medical problems   Principal Problem:   Gastroenteritis Active Problems:   Chronic gastritis with bleeding   Enteritis due to Clostridium difficile   Nausea and vomiting    PLAN   -- Complete 10-day course of oral vancomycin  for C-diff --Patient takes Embrel, possibly once a week.  If so, recommend he hold this medication for a couple of weeks since it is a biologic -- Though most of the diarrhea was due to C. Difficile, given that there was reports of black stool and he did have a decline in hemoglobin it is prudent to continue antibiotics empirically for SBP especially since he has ascites.  Would recommend total of 5 days. -- Continue famotidine  in place of PPI for the next 10 to 14 days.   Following that he can resume home PPI.   -- Instructed him on 2 g sodium restricted diet.  -- Unclear why he was only on as needed low-dose Lasix  at home prior to admission?  He came in with ascites and lower extremity edema though unlikely he is adhering to a 2 g sodium restricted diet.  Upon discharge recommend 20 mg of Lasix  daily and 50 mg of Aldactone daily.   -- Continue home dose of carvedilol  -- He was discharged home in November on rifaximin  but was not taking it at home for unclear reasons?  He does not like lactulose  due to diarrhea so would recommend he resume home rifaximin .  Otherwise, he is going to need to resume lactulose  and titrate to 2-3 BMs a day.  -- Our office will contact him with hospital follow-up -- Indefinite abstinence from alcohol.     Subjective    Diarrhea has totally resolved after just a day of vancomycin  He is asking to go home  Objective    Recent Labs    01/02/24 0706 01/02/24 2224 01/03/24 0452  WBC 3.8* 4.6 3.7*  HGB 6.9* 7.5* 7.5*  HCT 22.2* 23.7* 24.1*  MCV 76.3* 77.5* 77.2*  PLT 41* 46* 40*   No results for input(s): FOLATE, VITAMINB12, FERRITIN, TIBC, IRONPCTSAT in the last 72 hours. Recent Labs    01/01/24 0819 01/02/24 0706 01/03/24 0452  NA 135 135 134*  K 3.2* 2.9* 3.4*  CL 104 108 110  CO2 13* 20* 19*  GLUCOSE 99 86 71  BUN 9 7* 7*  CREATININE 0.79  0.74 0.61  CALCIUM  7.8* 7.7* 7.7*   Recent Labs    01/01/24 0819 01/02/24 0706 01/03/24 0452  PROT 6.3* 5.4* 5.4*  ALBUMIN  2.9* 2.5* 2.4*  AST 73* 49* 40  ALT 31 23 21   ALKPHOS 122 107 97  BILITOT 2.4* 2.1* 1.8*     Imaging:  CT ABDOMEN PELVIS W CONTRAST EXAM: CT ABDOMEN AND PELVIS WITH CONTRAST 01/01/2024 06:13:43 AM  TECHNIQUE: CT of the abdomen and pelvis was performed with the administration of intravenous contrast. 75 mL of iohexol  (OMNIPAQUE ) 350 MG/ML injection was administered. Multiplanar reformatted images are provided for review.  Automated exposure control, iterative reconstruction, and/or weight-based adjustment of the mA/kV was utilized to reduce the radiation dose to as low as reasonably achievable.  COMPARISON: CT with IV contrast 10/10/2023 and 02/27/2023.  CLINICAL HISTORY: Diarrhea; Vomiting, bilious (Ped 1-17y). Patient has a known history of cirrhosis and steatosis of the liver.  FINDINGS:  LOWER CHEST: Asymmetric subpleural fibrosis in the base of the right middle and lower lobes, subpleural reticulation posteriorly in the left lower lobe. Lung bases are unchanged. The cardiac size is normal.  LIVER: There is a cirrhotic liver configuration with mild to moderate general steatosis and capsular nodularity. No liver mass is seen.  GALLBLADDER AND BILE DUCTS: There is a cluster of 3 or 4 small subcentimeter stones in the proximal gallbladder but no wall thickening or biliary dilatation.  SPLEEN: There is mild splenomegaly with an AP dimension of 14.9 cm. There is no mass.  PANCREAS: No acute abnormality.  ADRENAL GLANDS: No acute abnormality. There is no adrenal mass  KIDNEYS, URETERS AND BLADDER: No stones in the kidneys or ureters. No hydronephrosis. No perinephric or periureteral stranding. Urinary bladder is unremarkable. There is no renal mass.  GI AND BOWEL: There are thickened folds in the stomach, duodenum, and multiple upper to mid-abdominal small bowel segments, unknown whether this is due to portal hypertension or gastroenteritis.  There is fluid in the colon without wall thickening. There is diverticulosis without diverticulitis. There are mild increased mesenteric congestive changes.  PERITONEUM AND RETROPERITONEUM: Increased mild abdominal and pelvic free ascites, greater in the right hemiabdomen. There is no free hemorrhage, free air, or localizing collection.  VASCULATURE: Aorta is normal in caliber. Moderate to heavy aortoiliac calcific plaques are again  seen.  LYMPH NODES: There are slightly prominent periportal lymph nodes, but no new or bulky adenopathy.  REPRODUCTIVE ORGANS: There is an enlarged prostate measuring 4.8 cm transverse.  BONES AND SOFT TISSUES: Degenerative change noted in thoracic and lumbar spine. No acute or significant osseous findings. No focal soft tissue abnormality.  IMPRESSION: 1. Thickened folds in the stomach, duodenum, and multiple upper to mid-abdominal small bowel segments, and in this case could be due to portal hypertension or gastroenteritis. 2. Increased mild abdominal and pelvic ascites, greater in the right hemiabdomen. Increased mesenteric congestive change. 3. Cirrhotic liver morphology with mild to moderate hepatic steatosis; no focal liver mass identified. Mild splenomegaly. 4. Uncomplicated cholelithiasis. 5. Prostatomegaly.  Electronically signed by: Francis Quam MD 01/01/2024 06:34 AM EST RP Workstation: [id number] DG Chest Portable 1 View EXAM: 1 VIEW(S) XRAY OF THE CHEST 01/01/2024 12:02:59 AM  COMPARISON: AP and lateral chest 12/16/2023.  CLINICAL HISTORY: weakness  FINDINGS:  LUNGS AND PLEURA: The lungs are expiratory with bronchovascular crowding and limited view of the bases. There is perihilar and basal coarse linear markings which are probably due to atelectasis.  There is no convincing focal pneumonia. Postsurgical changes are  again noted of the right upper lobe. No pleural effusion. No pneumothorax.  HEART AND MEDIASTINUM: No acute abnormality of the cardiac and mediastinal silhouettes.  BONES AND SOFT TISSUES: Thoracic spondylosis.  IMPRESSION: 1. No acute cardiopulmonary process identified. 2. Expiratory film with bronchovascular crowding and limited basal evaluation. 3. Perihilar and basal coarse linear markings, likely atelectasis. 4. Postsurgical changes in the right upper lobe.  Electronically signed by: Francis Quam MD 01/01/2024 12:08 AM EST  RP Workstation: [id number]     Scheduled inpatient medications:   carvedilol   3.125 mg Oral BID WC   famotidine   20 mg Oral BID   folic acid   1 mg Oral Daily   thiamine   100 mg Oral Daily   vancomycin   125 mg Oral QID   Continuous inpatient infusions:   cefTRIAXone  (ROCEPHIN )  IV 2 g (01/03/24 1235)   potassium PHOSPHATE  IVPB (in mmol)     PRN inpatient medications: acetaminophen  **OR** acetaminophen , albuterol , LORazepam , ondansetron  **OR** ondansetron  (ZOFRAN ) IV  Vital signs in last 24 hours: Temp:  [97.7 F (36.5 C)-98.9 F (37.2 C)] 97.8 F (36.6 C) (12/11 1603) Pulse Rate:  [69-78] 78 (12/11 1603) Resp:  [18-20] 18 (12/11 1146) BP: (113-148)/(56-92) 119/69 (12/11 1603) SpO2:  [98 %-99 %] 98 % (12/11 1603) Last BM Date : 01/02/24  Intake/Output Summary (Last 24 hours) at 01/03/2024 1632 Last data filed at 01/03/2024 0900 Gross per 24 hour  Intake 563.24 ml  Output --  Net 563.24 ml    Intake/Output from previous day: 12/10 0701 - 12/11 0700 In: 711.2 [Blood:358; IV Piggyback:353.2] Out: -  Intake/Output this shift: Total I/O In: 210 [P.O.:210] Out: -    Physical Exam:  General: Alert male in NAD Heart:  Regular rate and rhythm.  Pulmonary: Normal respiratory effort Abdomen: Soft, moderately distended, nontender. Normal bowel sounds. Extremities: 2+ BLE   Neurologic: Alert and oriented Psych: Pleasant. Cooperative    LOS: 2 days   Vina Dasen ,NP 01/03/2024, 4:32 PM

## 2024-01-04 ENCOUNTER — Other Ambulatory Visit (HOSPITAL_COMMUNITY): Payer: Self-pay

## 2024-01-04 DIAGNOSIS — K529 Noninfective gastroenteritis and colitis, unspecified: Secondary | ICD-10-CM | POA: Diagnosis not present

## 2024-01-04 LAB — COMPREHENSIVE METABOLIC PANEL WITH GFR
ALT: 21 U/L (ref 0–44)
AST: 38 U/L (ref 15–41)
Albumin: 2.6 g/dL — ABNORMAL LOW (ref 3.5–5.0)
Alkaline Phosphatase: 104 U/L (ref 38–126)
Anion gap: 7 (ref 5–15)
BUN: 8 mg/dL (ref 8–23)
CO2: 19 mmol/L — ABNORMAL LOW (ref 22–32)
Calcium: 7.7 mg/dL — ABNORMAL LOW (ref 8.9–10.3)
Chloride: 105 mmol/L (ref 98–111)
Creatinine, Ser: 0.73 mg/dL (ref 0.61–1.24)
GFR, Estimated: >60 mL/min (ref >60)
Glucose, Bld: 92 mg/dL (ref 70–99)
Potassium: 3.4 mmol/L — ABNORMAL LOW (ref 3.5–5.1)
Sodium: 131 mmol/L — ABNORMAL LOW (ref 135–145)
Total Bilirubin: 1.1 mg/dL (ref 0.0–1.2)
Total Protein: 5.8 g/dL — ABNORMAL LOW (ref 6.5–8.1)

## 2024-01-04 LAB — CBC
HCT: 24.8 % — ABNORMAL LOW (ref 39.0–52.0)
Hemoglobin: 7.8 g/dL — ABNORMAL LOW (ref 13.0–17.0)
MCH: 24.2 pg — ABNORMAL LOW (ref 26.0–34.0)
MCHC: 31.5 g/dL (ref 30.0–36.0)
MCV: 77 fL — ABNORMAL LOW (ref 80.0–100.0)
Platelets: 51 K/uL — ABNORMAL LOW (ref 150–400)
RBC: 3.22 MIL/uL — ABNORMAL LOW (ref 4.22–5.81)
RDW: 19.1 % — ABNORMAL HIGH (ref 11.5–15.5)
WBC: 4 K/uL (ref 4.0–10.5)
nRBC: 0.5 % — ABNORMAL HIGH (ref 0.0–0.2)

## 2024-01-04 LAB — PHOSPHORUS: Phosphorus: 2.5 mg/dL (ref 2.5–4.6)

## 2024-01-04 LAB — MAGNESIUM: Magnesium: 1.8 mg/dL (ref 1.7–2.4)

## 2024-01-04 MED ORDER — SPIRONOLACTONE 50 MG PO TABS
50.0000 mg | ORAL_TABLET | Freq: Every day | ORAL | 1 refills | Status: DC
  Filled 2024-01-04: qty 30, 30d supply, fill #0

## 2024-01-04 MED ORDER — FUROSEMIDE 20 MG PO TABS
20.0000 mg | ORAL_TABLET | Freq: Every day | ORAL | 1 refills | Status: DC
  Filled 2024-01-04: qty 30, 30d supply, fill #0

## 2024-01-04 MED ORDER — POTASSIUM CHLORIDE CRYS ER 20 MEQ PO TBCR
40.0000 meq | EXTENDED_RELEASE_TABLET | Freq: Once | ORAL | Status: AC
  Administered 2024-01-04: 40 meq via ORAL
  Filled 2024-01-04: qty 2

## 2024-01-04 MED ORDER — VANCOMYCIN HCL 125 MG PO CAPS
125.0000 mg | ORAL_CAPSULE | Freq: Four times a day (QID) | ORAL | 0 refills | Status: AC
  Filled 2024-01-04: qty 28, 7d supply, fill #0

## 2024-01-04 MED ORDER — K PHOS MONO-SOD PHOS DI & MONO 155-852-130 MG PO TABS
500.0000 mg | ORAL_TABLET | ORAL | Status: DC
  Administered 2024-01-04: 500 mg via ORAL
  Filled 2024-01-04: qty 2

## 2024-01-04 MED ORDER — FAMOTIDINE 20 MG PO TABS
20.0000 mg | ORAL_TABLET | Freq: Two times a day (BID) | ORAL | 1 refills | Status: DC
  Filled 2024-01-04: qty 60, 30d supply, fill #0

## 2024-01-04 NOTE — Discharge Summary (Signed)
 Physician Discharge Summary  Robert Greene FMW:[id number] DOB: Sep 10, 1960  PCP: [name], MD  Admitted from: Home Discharged to: Home  Admit date: 12/31/2023 Discharge date: 01/04/2024  Recommendations for Outpatient Follow-up:    Contact information for follow-up providers     [name], MD. Schedule an appointment as soon as possible for a visit in 1 week(s).   Specialty: Family Medicine Why: To be seen with repeat labs (CBC, CMP, magnesium  and phosphorus). Contact information: [address] [place] [address] [phone]              Contact information for after-discharge care     Home Medical Care     [facility]) .   Service: Home Health Services Contact information: [address] [place]  [address] [phone]                      Home Health: Home Health Orders (From admission, onward)     Start     Ordered   01/03/24 1218  Home Health  At discharge       Question Answer Comment  To provide the following care/treatments PT   To provide the following care/treatments RN      01/03/24 1217             Equipment/Devices: None    Discharge Condition: Improved and stable.   Code Status: Full Code Diet recommendation:  Discharge Diet Orders (From admission, onward)     Start     Ordered   01/04/24 0000  Diet - low sodium heart healthy        01/04/24 1126             Discharge Diagnoses:  Principal Problem:   Gastroenteritis Active Problems:   Chronic gastritis with bleeding   Enteritis due to Clostridium difficile   Nausea and vomiting   Brief Hospital Course:   63 year old male with PMH of alcohol use disorder, alcohol-related cirrhosis complicated by esophageal varices s/p banding in the past, portal hypertensive gastropathy, ascites, pancytopenia and hepatic encephalopathy; rheumatoid arthritis on Enbrel and Plaquenil ,  hypertension recent hospitalization 11/2 - 11/10 for alcohol withdrawal, concern for GI bleed, GI was consulted, underwent EGD 11/5 that showed grade 1 esophageal varices, portal hypertensive gastropathy, reportedly drank some vodka with orange juice 5 days prior to admission and then presented to the hospital with nausea, vomiting, poor oral intake and profuse diarrhea.  He ruled in for C. difficile.  Course complicated by reported intermittent dark stools, worsening anemia.  [facility] consulted.  Diarrhea improved.  Hemoglobin stable posttransfusion.     Assessment & Plan:    C. difficile colitis - Stool C. difficile antigen and toxin positive.  GI panel PCR negative. - Suspected due to recent hospitalization and antibiotic use for SBP prophylaxis in the context of GI bleed. - [facility] input appreciated.  They feel that his intermittent dark stools is likely related to intermittent bleeding from portal hypertensive gastropathy but DRE by them on 12/10 showed yellow stools. - Continue oral vancomycin  to complete 10 days course.  PPI transitioned to Pepcid .  Improved.  As suggested by GI, continue to hold Enbrel for 1-2 doses, held at DC until outpatient follow-up with GI for which she has an appointment on 12/26  and they can recommend if he can restart it.   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  Platelets were 119 on 11/23. Hemoglobin drifted down to 6.9 on 12/10, suspected due to slow intermittent bleeding from portal hypertensive gastropathy. As per [facility] evaluation, DRE on 12/10 showed yellow stools.  Holding off on EGD at this time. Aim to keep hemoglobin just above 7 g per DL and not over transfuse due to risk of variceal bleeding. Treated with IV ceftriaxone  for empiric SBP prophylaxis.  As discussed with Dr. Leigh on day of discharge, no further antibiotics at discharge given C. difficile and risk of  worsening. S/p 1 unit PRBC, hemoglobin up from 6.9 > 7.5 > 7.5 >7.8.  Stable.  Outpatient follow-up.   Alcohol-related cirrhosis complicated by - Esophageal varices s/p previous banding - Portal hypertensive gastropathy - Hepatic encephalopathy, prior - Ascites - Pancytopenia - Mild splenomegaly - As per [facility] MD note 12/9, patient claimed to have consumed alcohol 5 days PTA but his report to [facility] today is that he has not had alcohol for a long time and to this MD he stated that he had not drank alcohol in more than a month.  Therefore not really sure when last he drank alcohol but absolute alcohol cessation was been reiterated. -Continue low-dose carvedilol  for his esophageal varices and portal hypertensive gastropathy history.  Holding lactulose  due to ongoing diarrhea from C. difficile.  Now that his diarrhea has abated, can hold lactulose  for a couple more days and then resume. - Although he has tappable ascites, held off paracentesis due to significant thrombocytopenia and bleeding risks.  He was started on Lasix  20 mg daily and Aldactone 50 mg daily. - CT A/P results from 10/9 appreciated. -Patient has been extensively counseled by multiple providers regarding absolute alcohol cessation and he verbalizes understanding.   Hypokalemia Hypomagnesemia Hypophosphatemia NAGMA - Continued to closely monitor and replace aggressively including on day of discharge.  Follow labs closely as outpatient with PCP.   Essential hypertension - Controlled and soft at times.  Continue low-dose carvedilol  3.125 mg twice daily which should help his portal hypertensive gastropathy.  GI have started Lasix  20 mg daily and Aldactone 50 mg daily.  Hold losartan  to avoid hypotension and this can be addressed during close outpatient PCP/GI visit.   Rheumatoid arthritis ILD - Holding Enbrel given active C. difficile infection-see discussion above.  He apparently does not take Plaquenil .     Body mass index  is 29.3 kg/m.      Consultants:   [facility]   Procedures:       Discharge Instructions  Discharge Instructions     Call MD for:   Complete by: As directed    Recurrent or persistent dark tarry stools, blood in stools, vomiting blood or coffee ground colored material, recurrent diarrhea.   Call MD for:  difficulty breathing, headache or visual disturbances   Complete by: As directed    Call MD for:  extreme fatigue   Complete by: As directed    Call MD for:  persistant dizziness or light-headedness   Complete by: As directed    Call MD for:  persistant nausea and vomiting   Complete by: As directed    Call MD for:  severe uncontrolled pain   Complete by: As directed    Call MD for:  temperature >100.4   Complete by: As directed    Diet - low sodium heart healthy   Complete by: As directed    Increase activity slowly  Complete by: As directed         Medication List     PAUSE taking these medications    etanercept 50 MG/ML injection Wait to take this until your doctor or other care provider tells you to start again. Commonly known as: ENBREL Inject 50 mg into the skin every Saturday.   lactulose  10 GM/15ML solution Wait to take this until: January 07, 2024 Commonly known as: CHRONULAC  Take 30 mLs (20 g total) by mouth 2 (two) times daily.   losartan  100 MG tablet Wait to take this until your doctor or other care provider tells you to start again. Commonly known as: COZAAR  Take 100 mg by mouth daily.       STOP taking these medications    albuterol  108 (90 Base) MCG/ACT inhaler Commonly known as: VENTOLIN  HFA   hydroxychloroquine  200 MG tablet Commonly known as: PLAQUENIL    pantoprazole  40 MG tablet Commonly known as: PROTONIX        TAKE these medications    B-1 100 MG Tabs Take 1 tablet by mouth daily.   carvedilol  3.125 MG tablet Commonly known as: COREG  Take 1 tablet (3.125 mg total) by mouth 2 (two) times daily with a meal.    EPINEPHrine  0.3 mg/0.3 mL Soaj injection Commonly known as: EPI-PEN Use once.  If ineffective, use 2nd dose.   famotidine  20 MG tablet Commonly known as: PEPCID  Take 1 tablet (20 mg total) by mouth 2 (two) times daily.   folic acid  1 MG tablet Commonly known as: FOLVITE  Take 1 tablet (1 mg total) by mouth daily.   furosemide  20 MG tablet Commonly known as: Lasix  Take 1 tablet (20 mg total) by mouth daily. What changed:  when to take this reasons to take this   spironolactone 50 MG tablet Commonly known as: ALDACTONE Take 1 tablet (50 mg total) by mouth daily. Start taking on: January 05, 2024   triamcinolone  cream 0.1 % Commonly known as: KENALOG Apply 1 Application topically 2 (two) times daily.   vancomycin  125 MG capsule Commonly known as: VANCOCIN  Take 1 capsule (125 mg total) by mouth 4 (four) times daily for 7 days.       Allergies[1]    Procedures/Studies: CT ABDOMEN PELVIS W CONTRAST Result Date: 01/01/2024 EXAM: CT ABDOMEN AND PELVIS WITH CONTRAST 01/01/2024 06:13:43 AM TECHNIQUE: CT of the abdomen and pelvis was performed with the administration of intravenous contrast. 75 mL of iohexol  (OMNIPAQUE ) 350 MG/ML injection was administered. Multiplanar reformatted images are provided for review. Automated exposure control, iterative reconstruction, and/or weight-based adjustment of the mA/kV was utilized to reduce the radiation dose to as low as reasonably achievable. COMPARISON: CT with IV contrast 10/10/2023 and 02/27/2023. CLINICAL HISTORY: Diarrhea; Vomiting, bilious (Ped 1-17y). Patient has a known history of cirrhosis and steatosis of the liver. FINDINGS: LOWER CHEST: Asymmetric subpleural fibrosis in the base of the right middle and lower lobes, subpleural reticulation posteriorly in the left lower lobe. Lung bases are unchanged. The cardiac size is normal. LIVER: There is a cirrhotic liver configuration with mild to moderate general steatosis and capsular  nodularity. No liver mass is seen. GALLBLADDER AND BILE DUCTS: There is a cluster of 3 or 4 small subcentimeter stones in the proximal gallbladder but no wall thickening or biliary dilatation. SPLEEN: There is mild splenomegaly with an AP dimension of 14.9 cm. There is no mass. PANCREAS: No acute abnormality. ADRENAL GLANDS: No acute abnormality. There is no adrenal mass KIDNEYS, URETERS AND BLADDER: No stones  in the kidneys or ureters. No hydronephrosis. No perinephric or periureteral stranding. Urinary bladder is unremarkable. There is no renal mass. GI AND BOWEL: There are thickened folds in the stomach, duodenum, and multiple upper to mid-abdominal small bowel segments, unknown whether this is due to portal hypertension or gastroenteritis. There is fluid in the colon without wall thickening. There is diverticulosis without diverticulitis. There are mild increased mesenteric congestive changes. PERITONEUM AND RETROPERITONEUM: Increased mild abdominal and pelvic free ascites, greater in the right hemiabdomen. There is no free hemorrhage, free air, or localizing collection. VASCULATURE: Aorta is normal in caliber. Moderate to heavy aortoiliac calcific plaques are again seen. LYMPH NODES: There are slightly prominent periportal lymph nodes, but no new or bulky adenopathy. REPRODUCTIVE ORGANS: There is an enlarged prostate measuring 4.8 cm transverse. BONES AND SOFT TISSUES: Degenerative change noted in thoracic and lumbar spine. No acute or significant osseous findings. No focal soft tissue abnormality. IMPRESSION: 1. Thickened folds in the stomach, duodenum, and multiple upper to mid-abdominal small bowel segments, and in this case could be due to portal hypertension or gastroenteritis. 2. Increased mild abdominal and pelvic ascites, greater in the right hemiabdomen. Increased mesenteric congestive change. 3. Cirrhotic liver morphology with mild to moderate hepatic steatosis; no focal liver mass identified. Mild  splenomegaly. 4. Uncomplicated cholelithiasis. 5. Prostatomegaly. Electronically signed by: Francis Quam MD 01/01/2024 06:34 AM EST RP Workstation: [id number]   DG Chest Portable 1 View Result Date: 01/01/2024 EXAM: 1 VIEW(S) XRAY OF THE CHEST 01/01/2024 12:02:59 AM COMPARISON: AP and lateral chest 12/16/2023. CLINICAL HISTORY: weakness FINDINGS: LUNGS AND PLEURA: The lungs are expiratory with bronchovascular crowding and limited view of the bases. There is perihilar and basal coarse linear markings which are probably due to atelectasis. There is no convincing focal pneumonia. Postsurgical changes are again noted of the right upper lobe. No pleural effusion. No pneumothorax. HEART AND MEDIASTINUM: No acute abnormality of the cardiac and mediastinal silhouettes. BONES AND SOFT TISSUES: Thoracic spondylosis. IMPRESSION: 1. No acute cardiopulmonary process identified. 2. Expiratory film with bronchovascular crowding and limited basal evaluation. 3. Perihilar and basal coarse linear markings, likely atelectasis. 4. Postsurgical changes in the right upper lobe. Electronically signed by: Francis Quam MD 01/01/2024 12:08 AM EST RP Workstation: [id number]     Subjective: Feels great.  Denies complaints.  Reports that he had formed stool last night and this morning without diarrhea.  No abdominal pain.  Tolerating diet.  Ambulating steadily in the room and in the hall.  Discharge Exam:  Vitals:   01/03/24 2010 01/04/24 0104 01/04/24 0437 01/04/24 0751  BP: 116/61 (!) 153/76 (!) 156/84 (!) 102/48  Pulse: 73 80 79 76  Resp:    19  Temp: 98.7 F (37.1 C) 98.3 F (36.8 C) 98.3 F (36.8 C) 98.5 F (36.9 C)  TempSrc: Oral   Oral  SpO2: 100% 99% 99% 99%  Height:        General exam: Young male, moderately built and nourished seen ambulating steadily in the hall.  Subsequently evaluated in his bed. Respiratory system: Clear to auscultation. Respiratory effort normal. Cardiovascular system: S1 & S2  heard, RRR. No JVD, murmurs, rubs, gallops or clicks. No pedal edema.  Off telemetry. Gastrointestinal system: Abdomen is distended but not tense, soft and nontender. No organomegaly or masses felt. Normal bowel sounds heard.  Ascites ++ Central nervous system: Alert and oriented. No focal neurological deficits. Extremities: Symmetric 5 x 5 power. Skin: No rashes, lesions or ulcers Psychiatry: Judgement and insight  appear normal. Mood & affect appropriate.     The results of significant diagnostics from this hospitalization (including imaging, microbiology, ancillary and laboratory) are listed below for reference.     Microbiology: Recent Results (from the past 240 hours)  C Difficile Quick Screen w PCR reflex     Status: Abnormal   Collection Time: 01/01/24  1:56 AM   Specimen: STOOL  Result Value Ref Range Status   C Diff antigen POSITIVE (A) NEGATIVE Final   C Diff toxin POSITIVE (A) NEGATIVE Final   C Diff interpretation Toxin producing C. difficile detected.  Final    Comment: called 3 diff numbers couldn't get ahold of Lobbyist Performed at [facility], [address], [place], [place] [address]   Gastrointestinal Panel by PCR , Stool     Status: None   Collection Time: 01/01/24  1:56 AM   Specimen: Stool  Result Value Ref Range Status   Campylobacter species NOT DETECTED NOT DETECTED Final   Plesimonas shigelloides NOT DETECTED NOT DETECTED Final   Salmonella species NOT DETECTED NOT DETECTED Final   Yersinia enterocolitica NOT DETECTED NOT DETECTED Final   Vibrio species NOT DETECTED NOT DETECTED Final   Vibrio cholerae NOT DETECTED NOT DETECTED Final   Enteroaggregative E coli (EAEC) NOT DETECTED NOT DETECTED Final   Enteropathogenic E coli (EPEC) NOT DETECTED NOT DETECTED Final   Enterotoxigenic E coli (ETEC) NOT DETECTED NOT DETECTED Final   Shiga like toxin producing E coli (STEC) NOT DETECTED NOT DETECTED Final   Shigella/Enteroinvasive E coli (EIEC) NOT  DETECTED NOT DETECTED Final   Cryptosporidium NOT DETECTED NOT DETECTED Final   Cyclospora cayetanensis NOT DETECTED NOT DETECTED Final   Entamoeba histolytica NOT DETECTED NOT DETECTED Final   Giardia lamblia NOT DETECTED NOT DETECTED Final   Adenovirus F40/41 NOT DETECTED NOT DETECTED Final   Astrovirus NOT DETECTED NOT DETECTED Final   Norovirus GI/GII NOT DETECTED NOT DETECTED Final   Rotavirus A NOT DETECTED NOT DETECTED Final   Sapovirus (I, II, IV, and V) NOT DETECTED NOT DETECTED Final    Comment: Performed at [facility], [address] [place], [place] [address]     Labs: CBC: Recent Labs  Lab 12/31/23 2334 01/01/24 0819 01/02/24 0706 01/02/24 2224 01/03/24 0452 01/04/24 0529  WBC 4.1 4.6 3.8* 4.6 3.7* 4.0  NEUTROABS 2.1 2.5  --   --   --   --   HGB 8.1* 7.5* 6.9* 7.5* 7.5* 7.8*  HCT 26.2* 25.1* 22.2* 23.7* 24.1* 24.8*  MCV 76.2* 77.7* 76.3* 77.5* 77.2* 77.0*  PLT 51* 49* 41* 46* 40* 51*    Basic Metabolic Panel: Recent Labs  Lab 12/31/23 2334 01/01/24 0819 01/02/24 0706 01/03/24 0452 01/04/24 0529  NA 138 135 135 134* 131*  K 2.9* 3.2* 2.9* 3.4* 3.4*  CL 106 104 108 110 105  CO2 22 13* 20* 19* 19*  GLUCOSE 89 99 86 71 92  BUN 8 9 7* 7* 8  CREATININE 0.81 0.79 0.74 0.61 0.73  CALCIUM  8.6* 7.8* 7.7* 7.7* 7.7*  MG  --  1.6* 1.4* 2.0 1.8  PHOS  --  2.0* 1.5* 2.4* 2.5    Liver Function Tests: Recent Labs  Lab 12/31/23 2334 01/01/24 0819 01/02/24 0706 01/03/24 0452 01/04/24 0529  AST 81* 73* 49* 40 38  ALT 33 31 23 21 21   ALKPHOS 138* 122 107 97 104  BILITOT 2.5* 2.4* 2.1* 1.8* 1.1  PROT 6.7 6.3* 5.4* 5.4*  5.8*  ALBUMIN  3.1* 2.9* 2.5* 2.4* 2.6*      Time coordinating discharge: 35 minutes  SIGNED:  Trenda Mar, MD,  FACP, [facility], [facility], [facility]   [facility] [place]     To contact the attending provider between 7A-7P or the covering provider during after hours 7P-7A, please log into  the web site [email or web address] and access using universal [place] password for that web site. If you do not have the password, please call the hospital operator.    [1]  Allergies Allergen Reactions   Bee Venom Anaphylaxis, Hives and Rash   Spider Antivenin [S Black Widow (Latrodec Mactans) Antivenin] Anaphylaxis, Hives and Rash

## 2024-01-04 NOTE — Discharge Instructions (Signed)

## 2024-01-04 NOTE — Plan of Care (Signed)

## 2024-01-04 NOTE — Progress Notes (Signed)
 Pharmacy Electrolyte Replacement  Recent Labs:  Recent Labs    01/04/24 0529  K 3.4*  MG 1.8  PHOS 2.5  CREATININE 0.73   MD Contacted: Dr Judeth ok to replace K  Plan:  KPhos Neutral 500 mg PO q4h x 2 doses KCl 40 mEq PO x1  Delon Sax, PharmD, BCPS 8:08 AM

## 2024-01-04 NOTE — Plan of Care (Signed)

## 2024-01-14 ENCOUNTER — Observation Stay (HOSPITAL_COMMUNITY)
Admission: EM | Admit: 2024-01-14 | Discharge: 2024-01-16 | Disposition: A | Discharge status: Home / Self Care | Attending: Emergency Medicine | Admitting: Emergency Medicine

## 2024-01-14 ENCOUNTER — Observation Stay (HOSPITAL_COMMUNITY)

## 2024-01-14 ENCOUNTER — Emergency Department (HOSPITAL_COMMUNITY)

## 2024-01-14 ENCOUNTER — Encounter (HOSPITAL_COMMUNITY): Payer: Self-pay | Admitting: Internal Medicine

## 2024-01-14 ENCOUNTER — Other Ambulatory Visit: Payer: Self-pay

## 2024-01-14 DIAGNOSIS — Z79899 Other long term (current) drug therapy: Secondary | ICD-10-CM | POA: Insufficient documentation

## 2024-01-14 DIAGNOSIS — G9340 Encephalopathy, unspecified: Secondary | ICD-10-CM | POA: Diagnosis present

## 2024-01-14 DIAGNOSIS — F17210 Nicotine dependence, cigarettes, uncomplicated: Secondary | ICD-10-CM | POA: Insufficient documentation

## 2024-01-14 DIAGNOSIS — D509 Iron deficiency anemia, unspecified: Secondary | ICD-10-CM | POA: Insufficient documentation

## 2024-01-14 DIAGNOSIS — F1010 Alcohol abuse, uncomplicated: Secondary | ICD-10-CM | POA: Insufficient documentation

## 2024-01-14 DIAGNOSIS — K7030 Alcoholic cirrhosis of liver without ascites: Secondary | ICD-10-CM | POA: Insufficient documentation

## 2024-01-14 DIAGNOSIS — R197 Diarrhea, unspecified: Secondary | ICD-10-CM | POA: Insufficient documentation

## 2024-01-14 DIAGNOSIS — R748 Abnormal levels of other serum enzymes: Secondary | ICD-10-CM | POA: Diagnosis not present

## 2024-01-14 DIAGNOSIS — G9341 Metabolic encephalopathy: Secondary | ICD-10-CM

## 2024-01-14 DIAGNOSIS — D696 Thrombocytopenia, unspecified: Secondary | ICD-10-CM | POA: Insufficient documentation

## 2024-01-14 DIAGNOSIS — R4182 Altered mental status, unspecified: Principal | ICD-10-CM | POA: Insufficient documentation

## 2024-01-14 DIAGNOSIS — R410 Disorientation, unspecified: Secondary | ICD-10-CM | POA: Diagnosis present

## 2024-01-14 DIAGNOSIS — M069 Rheumatoid arthritis, unspecified: Secondary | ICD-10-CM | POA: Diagnosis not present

## 2024-01-14 LAB — CBC WITH DIFFERENTIAL/PLATELET
Abs Immature Granulocytes: 0.02 K/uL (ref 0.00–0.07)
Basophils Absolute: 0.1 K/uL (ref 0.0–0.1)
Basophils Relative: 1 %
Eosinophils Absolute: 0.1 K/uL (ref 0.0–0.5)
Eosinophils Relative: 2 %
HCT: 28.7 % — ABNORMAL LOW (ref 39.0–52.0)
Hemoglobin: 8.7 g/dL — ABNORMAL LOW (ref 13.0–17.0)
Immature Granulocytes: 0 %
Lymphocytes Relative: 28 %
Lymphs Abs: 2 K/uL (ref 0.7–4.0)
MCH: 22.8 pg — ABNORMAL LOW (ref 26.0–34.0)
MCHC: 30.3 g/dL (ref 30.0–36.0)
MCV: 75.1 fL — ABNORMAL LOW (ref 80.0–100.0)
Monocytes Absolute: 0.9 K/uL (ref 0.1–1.0)
Monocytes Relative: 12 %
Neutro Abs: 4.2 K/uL (ref 1.7–7.7)
Neutrophils Relative %: 57 %
Platelets: 171 K/uL (ref 150–400)
RBC: 3.82 MIL/uL — ABNORMAL LOW (ref 4.22–5.81)
RDW: 19.3 % — ABNORMAL HIGH (ref 11.5–15.5)
WBC: 7.4 K/uL (ref 4.0–10.5)
nRBC: 0 % (ref 0.0–0.2)

## 2024-01-14 LAB — BLOOD GAS, VENOUS
Acid-Base Excess: 2.6 mmol/L — ABNORMAL HIGH (ref 0.0–2.0)
Bicarbonate: 25.9 mmol/L (ref 20.0–28.0)
O2 Saturation: 89.1 %
Patient temperature: 37
pCO2, Ven: 34 mmHg — ABNORMAL LOW (ref 44–60)
pH, Ven: 7.49 — ABNORMAL HIGH (ref 7.25–7.43)
pO2, Ven: 57 mmHg — ABNORMAL HIGH (ref 32–45)

## 2024-01-14 LAB — COMPREHENSIVE METABOLIC PANEL WITH GFR
ALT: 32 U/L (ref 0–44)
AST: 69 U/L — ABNORMAL HIGH (ref 15–41)
Albumin: 4 g/dL (ref 3.5–5.0)
Alkaline Phosphatase: 170 U/L — ABNORMAL HIGH (ref 38–126)
Anion gap: 11 (ref 5–15)
BUN: 9 mg/dL (ref 8–23)
CO2: 24 mmol/L (ref 22–32)
Calcium: 9.5 mg/dL (ref 8.9–10.3)
Chloride: 104 mmol/L (ref 98–111)
Creatinine, Ser: 0.76 mg/dL (ref 0.61–1.24)
GFR, Estimated: >60 mL/min
Glucose, Bld: 113 mg/dL — ABNORMAL HIGH (ref 70–99)
Potassium: 4.3 mmol/L (ref 3.5–5.1)
Sodium: 138 mmol/L (ref 135–145)
Total Bilirubin: 1.3 mg/dL — ABNORMAL HIGH (ref 0.0–1.2)
Total Protein: 7.4 g/dL (ref 6.5–8.1)

## 2024-01-14 LAB — AMMONIA: Ammonia: 57 umol/L — ABNORMAL HIGH (ref 9–35)

## 2024-01-14 LAB — MAGNESIUM: Magnesium: 2 mg/dL (ref 1.7–2.4)

## 2024-01-14 LAB — ETHANOL: Alcohol, Ethyl (B): 91 mg/dL — ABNORMAL HIGH

## 2024-01-14 MED ORDER — ADULT MULTIVITAMIN W/MINERALS CH
1.0000 | ORAL_TABLET | Freq: Every day | ORAL | Status: DC
  Administered 2024-01-14 – 2024-01-16 (×3): 1 via ORAL
  Filled 2024-01-14 (×3): qty 1

## 2024-01-14 MED ORDER — TRAZODONE HCL 50 MG PO TABS
25.0000 mg | ORAL_TABLET | Freq: Every evening | ORAL | Status: DC | PRN
  Administered 2024-01-14: 25 mg via ORAL
  Filled 2024-01-14: qty 1

## 2024-01-14 MED ORDER — THIAMINE HCL 100 MG/ML IJ SOLN: 100.0000 mg | Freq: Every day | INTRAMUSCULAR | Status: DC

## 2024-01-14 MED ORDER — LORAZEPAM 1 MG PO TABS
1.0000 mg | ORAL_TABLET | ORAL | Status: DC | PRN
  Administered 2024-01-14 (×2): 1 mg via ORAL
  Filled 2024-01-14 (×2): qty 1

## 2024-01-14 MED ORDER — ONDANSETRON HCL 4 MG/2ML IJ SOLN
4.0000 mg | Freq: Four times a day (QID) | INTRAMUSCULAR | Status: DC | PRN
  Administered 2024-01-15: 4 mg via INTRAVENOUS
  Filled 2024-01-14: qty 2

## 2024-01-14 MED ORDER — SODIUM CHLORIDE 0.9 % IV BOLUS
1000.0000 mL | Freq: Once | INTRAVENOUS | Status: AC
  Administered 2024-01-14: 1000 mL via INTRAVENOUS

## 2024-01-14 MED ORDER — FUROSEMIDE 40 MG PO TABS
20.0000 mg | ORAL_TABLET | Freq: Every day | ORAL | Status: DC
  Administered 2024-01-14 – 2024-01-16 (×3): 20 mg via ORAL
  Filled 2024-01-14 (×3): qty 1

## 2024-01-14 MED ORDER — FOLIC ACID 1 MG PO TABS
1.0000 mg | ORAL_TABLET | Freq: Every day | ORAL | Status: DC
  Administered 2024-01-14 – 2024-01-16 (×3): 1 mg via ORAL
  Filled 2024-01-14 (×3): qty 1

## 2024-01-14 MED ORDER — ACETAMINOPHEN 325 MG PO TABS: 650.0000 mg | ORAL_TABLET | Freq: Four times a day (QID) | ORAL | Status: DC | PRN

## 2024-01-14 MED ORDER — ALBUTEROL SULFATE (2.5 MG/3ML) 0.083% IN NEBU: 2.5000 mg | INHALATION_SOLUTION | RESPIRATORY_TRACT | Status: DC | PRN

## 2024-01-14 MED ORDER — ONDANSETRON HCL 4 MG PO TABS
4.0000 mg | ORAL_TABLET | Freq: Four times a day (QID) | ORAL | Status: DC | PRN
  Administered 2024-01-16: 4 mg via ORAL
  Filled 2024-01-14: qty 1

## 2024-01-14 MED ORDER — FAMOTIDINE 20 MG PO TABS
20.0000 mg | ORAL_TABLET | Freq: Two times a day (BID) | ORAL | Status: DC
  Administered 2024-01-14 – 2024-01-16 (×5): 20 mg via ORAL
  Filled 2024-01-14 (×5): qty 1

## 2024-01-14 MED ORDER — CARVEDILOL 3.125 MG PO TABS
3.1250 mg | ORAL_TABLET | Freq: Two times a day (BID) | ORAL | Status: DC
  Administered 2024-01-14 – 2024-01-16 (×5): 3.125 mg via ORAL
  Filled 2024-01-14 (×5): qty 1

## 2024-01-14 MED ORDER — LACTULOSE 10 GM/15ML PO SOLN
20.0000 g | Freq: Three times a day (TID) | ORAL | Status: DC
  Administered 2024-01-14 (×4): 20 g via ORAL
  Filled 2024-01-14 (×4): qty 30

## 2024-01-14 MED ORDER — SPIRONOLACTONE 25 MG PO TABS
50.0000 mg | ORAL_TABLET | Freq: Every day | ORAL | Status: DC
  Administered 2024-01-14 – 2024-01-16 (×3): 50 mg via ORAL
  Filled 2024-01-14 (×3): qty 2

## 2024-01-14 MED ORDER — ACETAMINOPHEN 650 MG RE SUPP: 650.0000 mg | Freq: Four times a day (QID) | RECTAL | Status: DC | PRN

## 2024-01-14 MED ORDER — LORAZEPAM 2 MG/ML IJ SOLN
1.0000 mg | INTRAMUSCULAR | Status: DC | PRN
  Administered 2024-01-14: 1 mg via INTRAVENOUS
  Filled 2024-01-14: qty 1

## 2024-01-14 MED ORDER — ACETAMINOPHEN 650 MG RE SUPP: 325.0000 mg | Freq: Four times a day (QID) | RECTAL | Status: DC | PRN

## 2024-01-14 MED ORDER — THIAMINE MONONITRATE 100 MG PO TABS
100.0000 mg | ORAL_TABLET | Freq: Every day | ORAL | Status: DC
  Administered 2024-01-14 – 2024-01-16 (×3): 100 mg via ORAL
  Filled 2024-01-14 (×3): qty 1

## 2024-01-14 MED ORDER — ACETAMINOPHEN 500 MG PO TABS
500.0000 mg | ORAL_TABLET | Freq: Four times a day (QID) | ORAL | Status: DC | PRN
  Administered 2024-01-15 – 2024-01-16 (×2): 500 mg via ORAL
  Filled 2024-01-14 (×2): qty 1

## 2024-01-14 NOTE — H&P (Addendum)
 " History and Physical  Robert Greene FMW:[id number] DOB: Feb 21, 1960 DOA: 01/14/2024  PCP: [name], MD   Chief Complaint: Cannot focus  HPI: Robert Greene is a 63 y.o. male with medical history significant for alcoholism, hypertension, hyperlipidemia, RA on Enbrel (which is now on hold), liver cirrhosis and associated ascites with portal hypertensive gastropathy being admitted to the hospital with confusion likely due to hepatic encephalopathy and early alcohol withdrawal.  Patient was recently discharged from [facility] on 12/2, after stay for C. difficile colitis which was felt to be a result of empiric antibiotics for presumptive SBP.  At that time he was noted to have some ascites which was not tapped due to high risk of bleeding.  He was discharged from the hospital on oral lactulose , Lasix  and Aldactone .  He was told to hold his Enbrel for at least a couple of weeks, until he follows up with GI on 12/26.  Last night, patient called EMS and was unable to indicate exactly why.  He was brought to the ER for evaluation, where as detailed below he was found to have some elevated ammonia, and some agitation consistent with early/mild alcohol withdrawal.  Currently he is feeling better, he tells me that last night he was just feeling unwell, felt jittery and like he could not focus.  He could not read, he could not write, felt slightly confused and just felt like he needed to call for help.  Overall he states he has been doing okay since he went home, he has had multiple falls at home without serious injury and is unable to tell me when exactly this fall happened.  He denies any fevers, said that he had some subjective chills last night.  Denies any vomiting, any blood in his stool.  Admits that he has not been taking any of his prescription medications on a regular basis, he states that he did drink some liquor yesterday while he was watching the [employer].  Review of Systems: Please see HPI for  pertinent positives and negatives. A complete 10 system review of systems are otherwise negative.  Past Medical History:  Diagnosis Date   Alcoholism (HCC)    Anxiety    Arthritis    rheumatoid   Cirrhosis (HCC)    Coronary artery calcification seen on CAT scan 04/08/2018   Dyspnea    HTN (hypertension) 04/08/2018   Hypercholesteremia    Hypertension    Interstitial lung disease (HCC)    Kidney stones 2020   Pulmonary fibrosis (HCC)    Rheumatoid arthritis [facility])    Past Surgical History:  Procedure Laterality Date   COLONOSCOPY N/A 08/03/2023   Procedure: COLONOSCOPY;  Surgeon: Wilhelmenia Aloha Raddle., MD;  Location: [facility];  Service: Gastroenterology;  Laterality: N/A;   ESOPHAGOGASTRODUODENOSCOPY N/A 08/03/2023   Procedure: EGD (ESOPHAGOGASTRODUODENOSCOPY);  Surgeon: Wilhelmenia Aloha Raddle., MD;  Location: [facility];  Service: Gastroenterology;  Laterality: N/A;   ESOPHAGOGASTRODUODENOSCOPY N/A 09/16/2023   Procedure: EGD (ESOPHAGOGASTRODUODENOSCOPY);  Surgeon: Nandigam, Kavitha V, MD;  Location: [facility];  Service: Gastroenterology;  Laterality: N/A;   ESOPHAGOGASTRODUODENOSCOPY N/A 11/28/2023   Procedure: EGD (ESOPHAGOGASTRODUODENOSCOPY);  Surgeon: Avram Lupita BRAVO, MD;  Location: [facility];  Service: Gastroenterology;  Laterality: N/A;   KNEE CARTILAGE SURGERY Left    x2   LUNG BIOPSY Right 12/06/2017   Procedure: LUNG BIOPSY;  Surgeon: Kerrin Elspeth BROCKS, MD;  Location: [facility] OR;  Service: Thoracic;  Laterality: Right;   VIDEO ASSISTED THORACOSCOPY Right 12/06/2017   Procedure:  VIDEO ASSISTED THORACOSCOPY;  Surgeon: Kerrin Elspeth BROCKS, MD;  Location: [facility] OR;  Service: Thoracic;  Laterality: Right;   Social History:  reports that he has been smoking cigarettes. He has a 60 pack-year smoking history. He has never used smokeless tobacco. He reports that he does not currently use alcohol. He reports that he does not use drugs.  Allergies[1]  Family History   Problem Relation Age of Onset   Cirrhosis Father    Colon cancer Neg Hx    Stomach cancer Neg Hx    Esophageal cancer Neg Hx    Inflammatory bowel disease Neg Hx    Liver disease Neg Hx    Pancreatic cancer Neg Hx    Rectal cancer Neg Hx      Prior to Admission medications  Medication Sig Start Date End Date Taking? Authorizing Provider  carvedilol  (COREG ) 3.125 MG tablet Take 1 tablet (3.125 mg total) by mouth 2 (two) times daily with a meal. 09/17/23   Rosario Eland I, MD  EPINEPHrine  0.3 mg/0.3 mL IJ SOAJ injection Use once.  If ineffective, use 2nd dose. Patient not taking: Reported on 01/01/2024 11/19/16   Dean Clarity, MD  [Paused] etanercept (ENBREL) 50 MG/ML injection Inject 50 mg into the skin every Saturday. Wait to take this until your doctor or other care provider tells you to start again.    [provider]  famotidine  (PEPCID ) 20 MG tablet Take 1 tablet (20 mg total) by mouth 2 (two) times daily. 01/04/24   Hongalgi, Anand D, MD  folic acid  (FOLVITE ) 1 MG tablet Take 1 tablet (1 mg total) by mouth daily. 09/18/23   Rosario Eland FERNS, MD  furosemide  (LASIX ) 20 MG tablet Take 1 tablet (20 mg total) by mouth daily. 01/04/24   Hongalgi, Anand D, MD  lactulose  (CHRONULAC ) 10 GM/15ML solution Take 30 mLs (20 g total) by mouth 2 (two) times daily. 12/03/23   Sebastian Toribio GAILS, MD  [Paused] losartan  (COZAAR ) 100 MG tablet Take 100 mg by mouth daily. Wait to take this until your doctor or other care provider tells you to start again. 08/26/23   [provider]  spironolactone  (ALDACTONE ) 50 MG tablet Take 1 tablet (50 mg total) by mouth daily. 01/05/24   Hongalgi, Anand D, MD  Thiamine  HCl (B-1) 100 MG TABS Take 1 tablet by mouth daily. 11/10/23   [provider]  triamcinolone  cream (KENALOG) 0.1 % Apply 1 Application topically 2 (two) times daily. 12/07/23   [provider]    Physical Exam: BP 139/83   Pulse (!) 105   Temp 98.9 F  (37.2 C) (Oral)   Resp 19   SpO2 97%  General:  Alert, oriented x 4, calm, in no acute distress.  He looks disheveled, but is cooperative. Eyes: EOMI, clear conjuctivae, white sclerea Neck: supple, no masses, trachea mildline  Cardiovascular: RRR, no murmurs or rubs, no peripheral edema  Respiratory: clear to auscultation bilaterally, no wheezes, no crackles  Abdomen: soft, nontender, slightly distended with no obvious fluid wave, normal bowel tones heard  Skin: dry, no rashes  Musculoskeletal: no joint effusions, normal range of motion  Psychiatric: appropriate affect, normal speech  Neurologic: extraocular muscles intact, clear speech, moving all extremities with intact sensorium         Labs on Admission:  Basic Metabolic Panel: Recent Labs  Lab 01/14/24 0401  NA 138  K 4.3  CL 104  CO2 24  GLUCOSE 113*  BUN 9  CREATININE 0.76  CALCIUM  9.5   Liver Function Tests: Recent Labs  Lab 01/14/24 0401  AST 69*  ALT 32  ALKPHOS 170*  BILITOT 1.3*  PROT 7.4  ALBUMIN  4.0   No results for input(s): LIPASE, AMYLASE in the last 168 hours. Recent Labs  Lab 01/14/24 0401  AMMONIA 57*   CBC: Recent Labs  Lab 01/14/24 0401  WBC 7.4  NEUTROABS 4.2  HGB 8.7*  HCT 28.7*  MCV 75.1*  PLT 171   Cardiac Enzymes: No results for input(s): CKTOTAL, CKMB, CKMBINDEX, TROPONINI in the last 168 hours. BNP (last 3 results) Recent Labs    10/20/23 0756 11/09/23 2042 12/16/23 1040  BNP 20.6 49.8 55.6    ProBNP (last 3 results) No results for input(s): PROBNP in the last 8760 hours.  CBG: No results for input(s): GLUCAP in the last 168 hours.  Radiological Exams on Admission: CT Head Wo Contrast Result Date: 01/14/2024 EXAM: CT HEAD WITHOUT CONTRAST 01/14/2024 04:21:06 AM TECHNIQUE: CT of the head was performed without the administration of intravenous contrast. Automated exposure control, iterative reconstruction, and/or weight based adjustment of the  mA/kV was utilized to reduce the radiation dose to as low as reasonably achievable. COMPARISON: CT Head Nov 09, 2023 CLINICAL HISTORY: Mental status change, unknown cause FINDINGS: BRAIN AND VENTRICLES: No acute hemorrhage. No evidence of acute infarct. No hydrocephalus. No extra-axial collection. No mass effect or midline shift. ORBITS: No acute abnormality. SINUSES: No acute abnormality. SOFT TISSUES AND SKULL: No acute soft tissue abnormality. No skull fracture. IMPRESSION: 1. No acute intracranial abnormality. Electronically signed by: Gilmore Molt 01/14/2024 04:25 AM EST RP Workstation: [id number]   Assessment/Plan Robert Greene is a 63 y.o. male with medical history significant for alcoholism, hypertension, hyperlipidemia, RA on Enbrel (which is now on hold), liver cirrhosis and associated ascites with portal hypertensive gastropathy being admitted to the hospital with confusion likely due to hepatic encephalopathy and early alcohol withdrawal.   Acute encephalopathy-likely due to a combination of hepatic encephalopathy due to medication noncompliance, as well as mild metabolic encephalopathy from alcohol withdrawal.  CT head unremarkable, lab work reassuring without any signs or symptoms of acute infection. -Observation admission -Monitor closely on progressive unit -Resume cirrhosis related medications as below -Treat potential alcohol withdrawal as below  Alcoholic liver cirrhosis-patient continues to drink alcohol, and has not been compliant with his medications.  Presents with mildly elevated ammonia, and mild confusion.  He has very little if any ascites on exam, abdomen is nontender and he is afebrile.  Admits to medication noncompliance. -Continue lactulose  3 times daily -Continue home dose Lasix  and Aldactone   Alcohol abuse-on chart review, it appears patient has given different descriptions of his alcohol use, during his last hospitalization he told some of his care team that he  has not drank any alcohol in months.  Regardless, this morning he admits to having drank vodka yesterday. -Thiamine , folate, multivitamin -P.o./IV Ativan  per CIWA protocol  Rheumatoid arthritis-without any evidence of flare, Enbrel has been held during his last admission.  Plan is to resume when okay with gastroenterology, he has a follow-up visit with them on 12/26.  Elevated AST and alk phos-likely due to recent alcohol use -Trend LFTs with morning labs  Chronic anemia-without evidence of acute bleeding, likely due to combination of iron deficiency from slow GI bleed from his portal hypertensive gastropathy, as well as bone marrow suppression from alcohol abuse.  Hemoglobin is stable.  DVT prophylaxis: SCDs only    Code Status: Full Code  Consults called: None  Admission status: Observation  Time spent: 53 minutes  Zanasia Hickson CHRISTELLA Gail MD [facility] Pager [phone]  If 7PM-7AM, please contact night-coverage [email or web address] Password [facility]  01/14/2024, 7:37 AM      [1]  Allergies Allergen Reactions   Bee Venom Anaphylaxis, Hives and Rash   Spider Antivenin [S Black Widow (Latrodec Mactans) Antivenin] Anaphylaxis, Hives and Rash   "

## 2024-01-14 NOTE — Hospital Course (Addendum)
" °  63 year old man phx of ILD, HTN, alcoholic cirrhosis, esophageal varices s/p banding, portal hypertensive gastropathy, hepatic encephalopathy, chronic ascites, pancytopenia, splenomegaly, severe alcohol use disorder, RA and recent C. difficile infection presented to ED via EMS under apparently unknown reason.He lives by himself and apparently called 911. EMS understood that he may be feeling generally weak. Upon arrival he is unable to endorse weakness. He states he does not know what is going on. He tells me that he drinks occasionally but does not remember the last time he drank.   At presentation to ED patient is borderline hypertensive and tachycardic. Labwork CBC unremarkable normal WBC count stable H&H normal platelet count. CMP showing chronic transaminitis otherwise unremarkable. Elevated blood alcohol level 51. Elevated ammonia level.  Pending band level. CT head unremarkable.  In the ED patient is requiring IV Ativan  as needed for alcohol withdrawal.  Hospitalist consulted for further evaluation management of encephalopathy in the setting of alcohol use and hyperammonemia.   "

## 2024-01-14 NOTE — TOC Initial Note (Signed)
 Transition of Care [facility]) - Initial/Assessment Note   Patient Details  Name: Robert Greene MRN: [id number] Date of Birth: [birth year]  [facility] CM/SW Contact:    Duwaine GORMAN Aran, LCSW Phone Number: 01/14/2024, 1:31 PM  Clinical Narrative: Patient is from home and is currently active with [facility] for HHPT/RN. HH orders will be needed at discharge. ETOH use resources added to AVS at discharge. Care management to follow.  Expected Discharge Plan: Home w Home Health Services Barriers to Discharge: Continued Medical Work up  Expected Discharge Plan and Services In-house Referral: Clinical Social Work Post Acute Care Choice: Resumption of Svcs/PTA Provider Living arrangements for the past 2 months: Single Family Home           DME Arranged: N/A DME Agency: NA HH Agency: [facility] ([facility]) Date HH Agency Contacted: 01/14/24 Representative spoke with at [facility] Agency: [facility]  Prior Living Arrangements/Services Living arrangements for the past 2 months: Single Family Home Lives with:: Spouse Patient language and need for interpreter reviewed:: Yes Do you feel safe going back to the place where you live?: Yes      Need for Family Participation in Patient Care: Yes (Comment) Care giver support system in place?: Yes (comment) Current home services: [facility]) Criminal Activity/Legal Involvement Pertinent to Current Situation/Hospitalization: No - Comment as needed  Activities of Daily Living ADL Screening (condition at time of admission) Independently performs ADLs?: Yes (appropriate for developmental age) Does the patient have a NEW difficulty with getting in/out of bed, walking, or climbing stairs that is expected to last >3 days?: Yes (Initiates electronic notice to provider for possible PT consult) Does the patient have a NEW difficulty with communication that is expected to last >3 days?: No Is the patient deaf or have difficulty hearing?: No Does the  patient have difficulty seeing, even when wearing glasses/contacts?: No Does the patient have difficulty concentrating, remembering, or making decisions?: No  Emotional Assessment Alcohol / Substance Use: Alcohol Use Psych Involvement: No (comment)  Admission diagnosis:  Encephalopathy [G93.40] Altered mental status, unspecified altered mental status type [R41.82] Encephalopathy, unspecified type [G93.40] Patient Active Problem List   Diagnosis Date Noted   Encephalopathy 01/14/2024   Enteritis due to Clostridium difficile 01/02/2024   Nausea and vomiting 01/02/2024   Gastroenteritis 01/01/2024   Coffee ground emesis 11/28/2023   Encephalopathy, hepatic (HCC) 11/26/2023   Acute blood loss anemia 11/26/2023   Acute GI bleeding 11/25/2023   Rheumatoid arthritis (HCC) 11/25/2023   Thrombocytopenia 11/25/2023   Hypertensive urgency 11/25/2023   Melena 11/25/2023   Bleeding esophageal varices (HCC) 11/25/2023   Falls frequently 09/20/2023   Hypokalemia 09/19/2023   Pancytopenia (HCC) 09/19/2023   Weakness 09/14/2023   Chronic gastritis with bleeding 08/03/2023   Cirrhosis of liver with ascites (HCC) 08/02/2023   Rectal bleeding 08/02/2023   GI bleed 08/01/2023   Hypotension 01/30/2023   Alcohol use disorder, moderate, dependence (HCC) 01/30/2023   Colon cancer screening 05/13/2022   Alcoholic cirrhosis of liver with ascites (HCC) 05/13/2022   Secondary esophageal varices without bleeding (HCC) 11/10/2021   Portal hypertensive gastropathy (HCC) 11/10/2021   Duodenal nodule 11/10/2021   Alcoholic cirrhosis of liver without ascites (HCC) 07/16/2021   Anemia 07/16/2021   History of colon polyps 07/16/2021   Alcohol use disorder, severe, dependence (HCC) 07/16/2021   Atherosclerosis of both carotid arteries 07/07/2019   Essential hypertension 04/08/2018   Coronary artery calcification seen on CAT scan 04/08/2018   Laboratory examination 04/08/2018  Mixed hyperlipidemia  04/08/2018   ILD (interstitial lung disease) (HCC) 12/06/2017   PCP:  [name], MD Pharmacy:   [facility] [phone] - [address] [place] - [address] AT [facility] [address] [place] [address] Phone: [phone] Fax: [phone]  [facility] [address] [place] [address] Phone: [phone] Fax: [phone]  Social Drivers of Health (SDOH) Social History: SDOH Screenings   Food Insecurity: Food Insecurity Present (01/14/2024)  Housing: Low Risk (01/14/2024)  Transportation Needs: No Transportation Needs (01/14/2024)  Utilities: Not At Risk (01/14/2024)  Tobacco Use: High Risk (01/14/2024)   SDOH Interventions:    Readmission Risk Interventions    01/02/2024    3:06 PM  Readmission Risk Prevention Plan  Transportation Screening Complete  Medication Review (RN Care Manager) Complete  PCP or Specialist appointment within 3-5 days of discharge Complete  HRI or Home Care Consult Complete  SW Recovery Care/Counseling Consult Complete  Palliative Care Screening Complete  Skilled Nursing Facility Not Applicable

## 2024-01-14 NOTE — ED Provider Notes (Signed)
 " [place] EMERGENCY DEPARTMENT AT [facility] Provider Note   CSN: [id number] Arrival date & time: 01/14/24  9681     Patient presents with: Altered Mental Status   Robert Greene is a 63 y.o. male.  Patient with past medical history significant for interstitial lung disease, hypertension, alcoholic cirrhosis of liver, severe alcohol use disorder with unknown recent alcohol consumption, frequent presentations for weakness, RA, recent C. difficile infection presents to the emergency department via EMS but is unsure as to why.  He lives by himself and apparently called 911.  EMS understood that he may be feeling generally weak.  Upon arrival he is unable to endorse weakness.  He states he does not know what is going on.  He tells me that he drinks occasionally but does not remember the last time he drank.  He is denying physical complaints such as chest pain, abdominal pain, nausea, vomiting, shortness of breath.  He is oriented to person, place, and time but has no knowledge as to why he is in the emergency department.  The patient does feel that he may have briefly been able to see and I have been seeing things when at home but does not endorse these findings now.  He is unsure as to the last time he felt normal.  He does state that he remembers eating something much earlier in the day but is unsure as to when that was.    Altered Mental Status      Prior to Admission medications  Medication Sig Start Date End Date Taking? Authorizing Provider  carvedilol  (COREG ) 3.125 MG tablet Take 1 tablet (3.125 mg total) by mouth 2 (two) times daily with a meal. 09/17/23   Rosario Eland I, MD  EPINEPHrine  0.3 mg/0.3 mL IJ SOAJ injection Use once.  If ineffective, use 2nd dose. Patient not taking: Reported on 01/01/2024 11/19/16   Dean Clarity, MD  [Paused] etanercept (ENBREL) 50 MG/ML injection Inject 50 mg into the skin every Saturday. Wait to take this until your doctor or other  care provider tells you to start again.    [provider]  famotidine  (PEPCID ) 20 MG tablet Take 1 tablet (20 mg total) by mouth 2 (two) times daily. 01/04/24   Hongalgi, Anand D, MD  folic acid  (FOLVITE ) 1 MG tablet Take 1 tablet (1 mg total) by mouth daily. 09/18/23   Rosario Eland FERNS, MD  furosemide  (LASIX ) 20 MG tablet Take 1 tablet (20 mg total) by mouth daily. 01/04/24   Hongalgi, Anand D, MD  lactulose  (CHRONULAC ) 10 GM/15ML solution Take 30 mLs (20 g total) by mouth 2 (two) times daily. 12/03/23   Sebastian Toribio GAILS, MD  [Paused] losartan  (COZAAR ) 100 MG tablet Take 100 mg by mouth daily. Wait to take this until your doctor or other care provider tells you to start again. 08/26/23   [provider]  spironolactone  (ALDACTONE ) 50 MG tablet Take 1 tablet (50 mg total) by mouth daily. 01/05/24   Hongalgi, Anand D, MD  Thiamine  HCl (B-1) 100 MG TABS Take 1 tablet by mouth daily. 11/10/23   [provider]  triamcinolone  cream (KENALOG) 0.1 % Apply 1 Application topically 2 (two) times daily. 12/07/23   [provider]    Allergies: Bee venom and Spider antivenin [s black widow (latrodec mactans) antivenin]    Review of Systems  Updated Vital Signs BP (!) 152/109   Pulse (!) 110   Temp 98.9 F (37.2 C) (Oral)  Resp (!) 28   SpO2 96%   Physical Exam Vitals and nursing note reviewed.  Constitutional:      General: He is not in acute distress.    Appearance: He is well-developed.  HENT:     Head: Normocephalic and atraumatic.  Eyes:     Conjunctiva/sclera: Conjunctivae normal.  Cardiovascular:     Rate and Rhythm: Regular rhythm. Tachycardia present.  Pulmonary:     Effort: Pulmonary effort is normal. No respiratory distress.     Breath sounds: Normal breath sounds.  Abdominal:     Palpations: Abdomen is soft.     Tenderness: There is no abdominal tenderness.  Musculoskeletal:        General: No swelling.     Cervical back: Neck supple.   Skin:    General: Skin is warm and dry.     Capillary Refill: Capillary refill takes less than 2 seconds.  Neurological:     Mental Status: He is alert.  Psychiatric:        Mood and Affect: Mood normal.     (all labs ordered are listed, but only abnormal results are displayed) Labs Reviewed  BLOOD GAS, VENOUS - Abnormal; Notable for the following components:      Result Value   pH, Ven 7.49 (*)    pCO2, Ven 34 (*)    pO2, Ven 57 (*)    Acid-Base Excess 2.6 (*)    All other components within normal limits  AMMONIA - Abnormal; Notable for the following components:   Ammonia 57 (*)    All other components within normal limits  CBC WITH DIFFERENTIAL/PLATELET - Abnormal; Notable for the following components:   RBC 3.82 (*)    Hemoglobin 8.7 (*)    HCT 28.7 (*)    MCV 75.1 (*)    MCH 22.8 (*)    RDW 19.3 (*)    All other components within normal limits  COMPREHENSIVE METABOLIC PANEL WITH GFR - Abnormal; Notable for the following components:   Glucose, Bld 113 (*)    AST 69 (*)    Alkaline Phosphatase 170 (*)    Total Bilirubin 1.3 (*)    All other components within normal limits  ETHANOL - Abnormal; Notable for the following components:   Alcohol, Ethyl (B) 91 (*)    All other components within normal limits  URINALYSIS, ROUTINE W REFLEX MICROSCOPIC  MAGNESIUM     EKG: EKG Interpretation Date/Time:  Monday January 14 2024 03:58:34 EST Ventricular Rate:  88 PR Interval:  145 QRS Duration:  91 QT Interval:  388 QTC Calculation: 470 R Axis:   31  Text Interpretation: Sinus rhythm Multiple ventricular premature complexes Minimal ST elevation, anterior leads Baseline wander in lead(s) V2 Confirmed by Trine Likes [phone]) on 01/14/2024 4:28:19 AM  Radiology: CT Head Wo Contrast Result Date: 01/14/2024 EXAM: CT HEAD WITHOUT CONTRAST 01/14/2024 04:21:06 AM TECHNIQUE: CT of the head was performed without the administration of intravenous contrast. Automated exposure  control, iterative reconstruction, and/or weight based adjustment of the mA/kV was utilized to reduce the radiation dose to as low as reasonably achievable. COMPARISON: CT Head Nov 09, 2023 CLINICAL HISTORY: Mental status change, unknown cause FINDINGS: BRAIN AND VENTRICLES: No acute hemorrhage. No evidence of acute infarct. No hydrocephalus. No extra-axial collection. No mass effect or midline shift. ORBITS: No acute abnormality. SINUSES: No acute abnormality. SOFT TISSUES AND SKULL: No acute soft tissue abnormality. No skull fracture. IMPRESSION: 1. No acute intracranial abnormality. Electronically signed by: Gilmore  Jones 01/14/2024 04:25 AM EST RP Workstation: [id number]     .Critical Care  Performed by: Logan Ubaldo NOVAK, PA-C Authorized by: Logan Ubaldo NOVAK, PA-C   Critical care provider statement:    Critical care time (minutes):  30   Critical care time was exclusive of:  Separately billable procedures and treating other patients   Critical care was necessary to treat or prevent imminent or life-threatening deterioration of the following conditions:  CNS failure or compromise   Critical care was time spent personally by me on the following activities:  Development of treatment plan with patient or surrogate, discussions with consultants, evaluation of patient's response to treatment, examination of patient, ordering and review of laboratory studies, ordering and review of radiographic studies, ordering and performing treatments and interventions, pulse oximetry, re-evaluation of patient's condition and review of old charts   Care discussed with: admitting provider      Medications Ordered in the ED  LORazepam  (ATIVAN ) tablet 1-4 mg (has no administration in time range)    Or  LORazepam  (ATIVAN ) injection 1-4 mg (has no administration in time range)  thiamine  (VITAMIN B1) tablet 100 mg (has no administration in time range)    Or  thiamine  (VITAMIN B1) injection 100 mg (has no  administration in time range)  folic acid  (FOLVITE ) tablet 1 mg (has no administration in time range)  multivitamin with minerals tablet 1 tablet (has no administration in time range)  lactulose  (CHRONULAC ) 10 GM/15ML solution 20 g (has no administration in time range)  sodium chloride  0.9 % bolus 1,000 mL (0 mLs Intravenous Stopped 01/14/24 0602)                                    Medical Decision Making Amount and/or Complexity of Data Reviewed Labs: ordered. Radiology: ordered.  Risk OTC drugs. Prescription drug management.   This patient presents to the ED for concern of confusion, this involves an extensive number of treatment options, and is a complaint that carries with it a high risk of complications and morbidity.  The differential diagnosis includes hepatic encephalopathy, alcohol intoxication, Warnicke's encephalopathy, early alcohol withdrawal, intracranial abnormality, others   Co morbidities / Chronic conditions that complicate the patient evaluation  As noted in HPI   Additional history obtained:  Additional history obtained from EMR External records from outside source obtained and reviewed including recent admission notes   Lab Tests:  I Ordered, and personally interpreted labs.  The pertinent results include: Ethanol 91, ammonia 57   Imaging Studies ordered:  I ordered imaging studies including CT head without contrast I independently visualized and interpreted imaging which showed no acute findings I agree with the radiologist interpretation   Cardiac Monitoring: / EKG:  The patient was maintained on a cardiac monitor.  I personally viewed and interpreted the cardiac monitored which showed an underlying rhythm of: Sinus rhythm   Problem List / ED Course / Critical interventions / Medication management   I ordered medication including saline bolus, Ativan , folic acid , thiamine  Reevaluation of the patient after these medicines showed that the  patient stayed the sameed   Consultations Obtained:  I requested consultation with the hospitalist, Dr.Sundil, and discussed lab and imaging findings as well as pertinent plan - they recommend: admission   Social Determinants of Health:  Patient is a daily smoker   Test / Admission - Considered:  Patient with continued confusion, concerning for possible  hepatic cephalopathy versus Warnicke's encephalopathy versus early withdrawal.  Unable to differentiate based on current lab work.  He does have elevated ammonia but has had higher ammonia in the past.  He still has an ethanol level of 91 but has history concerning for liver disease also still have concerns about possible early withdrawal.  CIWA protocol initiated.  He does not need ICU admission at this time as he is not requiring Precedex .  He does need admission for further evaluation.      Final diagnoses:  Altered mental status, unspecified altered mental status type  Encephalopathy, unspecified type    ED Discharge Orders     None          Logan Ubaldo KATHEE DEVONNA 01/14/24 9376    Trine Raynell Moder, MD 01/14/24 [phone]  "

## 2024-01-14 NOTE — ED Triage Notes (Signed)
 Patient BIB EMS from home c/o confusion and visual changes. Patient report he has visual changes when he wake up this morning, Patient states he can't remember what happened and why his in the hospital. Patient denies fall. Denies taking blood thinners. Unknown LKN.

## 2024-01-14 NOTE — Plan of Care (Signed)

## 2024-01-15 ENCOUNTER — Telehealth: Payer: Self-pay | Admitting: *Deleted

## 2024-01-15 DIAGNOSIS — K7682 Hepatic encephalopathy: Secondary | ICD-10-CM

## 2024-01-15 LAB — CBC
HCT: 26.6 % — ABNORMAL LOW (ref 39.0–52.0)
Hemoglobin: 8.3 g/dL — ABNORMAL LOW (ref 13.0–17.0)
MCH: 23.1 pg — ABNORMAL LOW (ref 26.0–34.0)
MCHC: 31.2 g/dL (ref 30.0–36.0)
MCV: 74.1 fL — ABNORMAL LOW (ref 80.0–100.0)
Platelets: 114 K/uL — ABNORMAL LOW (ref 150–400)
RBC: 3.59 MIL/uL — ABNORMAL LOW (ref 4.22–5.81)
RDW: 18.8 % — ABNORMAL HIGH (ref 11.5–15.5)
WBC: 4.6 K/uL (ref 4.0–10.5)
nRBC: 0 % (ref 0.0–0.2)

## 2024-01-15 LAB — COMPREHENSIVE METABOLIC PANEL WITH GFR
ALT: 25 U/L (ref 0–44)
AST: 54 U/L — ABNORMAL HIGH (ref 15–41)
Albumin: 3.6 g/dL (ref 3.5–5.0)
Alkaline Phosphatase: 156 U/L — ABNORMAL HIGH (ref 38–126)
Anion gap: 10 (ref 5–15)
BUN: 10 mg/dL (ref 8–23)
CO2: 20 mmol/L — ABNORMAL LOW (ref 22–32)
Calcium: 9.2 mg/dL (ref 8.9–10.3)
Chloride: 107 mmol/L (ref 98–111)
Creatinine, Ser: 0.68 mg/dL (ref 0.61–1.24)
GFR, Estimated: >60 mL/min
Glucose, Bld: 83 mg/dL (ref 70–99)
Potassium: 3.6 mmol/L (ref 3.5–5.1)
Sodium: 136 mmol/L (ref 135–145)
Total Bilirubin: 1.9 mg/dL — ABNORMAL HIGH (ref 0.0–1.2)
Total Protein: 6.6 g/dL (ref 6.5–8.1)

## 2024-01-15 NOTE — Progress Notes (Signed)
 " PROGRESS NOTE    Robert Greene  FMW:[id number] DOB: March 21, 1960 DOA: 01/14/2024 PCP: [name], MD    Brief Narrative:  63 year old with ongoing alcoholism, chronic liver disease, hypertension, hyperlipidemia, rheumatoid arthritis on Enbrel that is on hold, portal hypertensive gastropathy presented to the emergency room with confusion.  Recently admitted to [facility] and discharged on 12/12 after diagnosis of C. difficile colitis that was thought to be aggravated by presumptive antibiotic that was used for SBP.  Patient tells me that he did well going home, but yesterday he woke up confused so called EMS. In the emergency room hemodynamically stable.  Blood pressure stable.  Hemoglobin 8.7 at about baseline.  CT head without any acute findings.  Ammonia was 57.  Patient reported drinking alcohol the night before.  He was admitted due to significant symptoms.  Patient has reported 6-10 bowel movements a day.  Subjective: Patient seen and examined.  He denies any confusion, tremors.  Denies any nausea vomiting.  He tells me that he is going to bathroom at least 10 times in a day.  He was not taking any lactulose  at home.  Patient is not sure if he completed vancomycin  that was prescribed 10 days ago. Patient was asking whether he has liver damage from his drinking and he was counseled that he does have evidence of liver damage.   Assessment & Plan:   Acute encephalopathy, suspected hepatic encephalopathy, medication noncompliance. Clinically improving.  CT head without any acute findings.  Nonfocal. Given lactulose  overnight, see below about C. difficile infection. Supportive care, mobility, monitoring.  Ongoing diarrhea, recent C. difficile colitis: Likely incompletely treated.  Patient is on lactulose  20 g 3 times daily since admission with multiple episodes of diarrhea.  Unknown whether diarrhea is caused by lactulose  or by C. difficile. Hold lactulose  today.  Monitor.   Continue maintenance fluid.  If persistent diarrhea despite stopping lactulose , will need to repeat dose of vancomycin  for C. difficile.  Discussed with ID pharmacy.  Alcoholic liver cirrhosis, ongoing alcohol use: Currently stable.  Compensated.  No evidence of ascites on exam.  Continue Lasix  and Aldactone .  Holding lactulose  for ongoing diarrhea. Patient at risk of alcohol withdrawal.  Remains on CIWA protocol with benzodiazepines.  Also on multivitamins.  Anemia of chronic disease: At about baseline.  Monitor.  Rheumatoid arthritis: Without evidence of flare.  Enbrel on hold due to ongoing gastroenteritis.     DVT prophylaxis: SCDs Start: 01/14/24 9271   Code Status: Full code Family Communication: No family at the bedside Disposition Plan: Status is: Observation The patient will require care spanning > 2 midnights and should be moved to inpatient because: Significant symptoms, needs monitoring and treatment     Consultants:  None  Procedures:  None  Antimicrobials:  None     Objective: Vitals:   01/14/24 1349 01/14/24 1746 01/14/24 2146 01/15/24 0600  BP: (!) 140/84 137/69 133/82 (!) 111/58  Pulse: (!) 102 81 84 72  Resp: 16 16 17 16   Temp: 98.8 F (37.1 C) 98.9 F (37.2 C) 99.3 F (37.4 C) 98.9 F (37.2 C)  TempSrc: Oral Oral Oral Oral  SpO2: 98% 99% 97% 97%  Weight:      Height:        Intake/Output Summary (Last 24 hours) at 01/15/2024 1251 Last data filed at 01/15/2024 0602 Gross per 24 hour  Intake --  Output 700 ml  Net -700 ml   Filed Weights   01/14/24 0905  Weight: 86 kg    Examination:  General exam: Appears calm and comfortable.  Frail and debilitated.  Chronically sick looking.  Not in any distress. Respiratory system: Clear to auscultation. Respiratory effort normal.  On room air. Cardiovascular system: S1 & S2 heard, RRR. No JVD, murmurs, rubs, gallops or clicks. No pedal edema. Gastrointestinal system: Abdomen is nondistended,  soft and nontender. No organomegaly or masses felt. Normal bowel sounds heard. Central nervous system: Alert and oriented. No focal neurological deficits.  No evidence of tremors or asterixis. Extremities: Symmetric 5 x 5 power.    Data Reviewed: I have personally reviewed following labs and imaging studies  CBC: Recent Labs  Lab 01/14/24 0401 01/15/24 0405  WBC 7.4 4.6  NEUTROABS 4.2  --   HGB 8.7* 8.3*  HCT 28.7* 26.6*  MCV 75.1* 74.1*  PLT 171 114*   Basic Metabolic Panel: Recent Labs  Lab 01/14/24 0401 01/15/24 0405  NA 138 136  K 4.3 3.6  CL 104 107  CO2 24 20*  GLUCOSE 113* 83  BUN 9 10  CREATININE 0.76 0.68  CALCIUM  9.5 9.2  MG 2.0  --    GFR: Estimated Creatinine Clearance: 102.7 mL/min (by C-G formula based on SCr of 0.68 mg/dL). Liver Function Tests: Recent Labs  Lab 01/14/24 0401 01/15/24 0405  AST 69* 54*  ALT 32 25  ALKPHOS 170* 156*  BILITOT 1.3* 1.9*  PROT 7.4 6.6  ALBUMIN  4.0 3.6   No results for input(s): LIPASE, AMYLASE in the last 168 hours. Recent Labs  Lab 01/14/24 0401  AMMONIA 57*   Coagulation Profile: No results for input(s): INR, PROTIME in the last 168 hours. Cardiac Enzymes: No results for input(s): CKTOTAL, CKMB, CKMBINDEX, TROPONINI in the last 168 hours. BNP (last 3 results) No results for input(s): PROBNP in the last 8760 hours. HbA1C: No results for input(s): HGBA1C in the last 72 hours. CBG: No results for input(s): GLUCAP in the last 168 hours. Lipid Profile: No results for input(s): CHOL, HDL, LDLCALC, TRIG, CHOLHDL, LDLDIRECT in the last 72 hours. Thyroid  Function Tests: No results for input(s): TSH, T4TOTAL, FREET4, T3FREE, THYROIDAB in the last 72 hours. Anemia Panel: No results for input(s): VITAMINB12, FOLATE, FERRITIN, TIBC, IRON, RETICCTPCT in the last 72 hours. Sepsis Labs: No results for input(s): PROCALCITON, LATICACIDVEN in the last 168  hours.  No results found for this or any previous visit (from the past 240 hours).       Radiology Studies: CT Head Wo Contrast Result Date: 01/14/2024 EXAM: CT HEAD WITHOUT CONTRAST 01/14/2024 04:21:06 AM TECHNIQUE: CT of the head was performed without the administration of intravenous contrast. Automated exposure control, iterative reconstruction, and/or weight based adjustment of the mA/kV was utilized to reduce the radiation dose to as low as reasonably achievable. COMPARISON: CT Head Nov 09, 2023 CLINICAL HISTORY: Mental status change, unknown cause FINDINGS: BRAIN AND VENTRICLES: No acute hemorrhage. No evidence of acute infarct. No hydrocephalus. No extra-axial collection. No mass effect or midline shift. ORBITS: No acute abnormality. SINUSES: No acute abnormality. SOFT TISSUES AND SKULL: No acute soft tissue abnormality. No skull fracture. IMPRESSION: 1. No acute intracranial abnormality. Electronically signed by: Gilmore Molt 01/14/2024 04:25 AM EST RP Workstation: [id number]        Scheduled Meds:  carvedilol   3.125 mg Oral BID WC   famotidine   20 mg Oral BID   folic acid   1 mg Oral Daily   furosemide   20 mg Oral Daily   multivitamin with minerals  1 tablet Oral Daily   spironolactone   50 mg Oral Daily   thiamine   100 mg Oral Daily   Or   thiamine   100 mg Intravenous Daily   Continuous Infusions:   LOS: 0 days    Time spent: 51 minutes    Renato Applebaum, MD [facility]   "

## 2024-01-15 NOTE — Telephone Encounter (Signed)
 Appointment canceled. Mailbox full unable to leave message.

## 2024-01-15 NOTE — Telephone Encounter (Signed)
-----   Message from [place], [place] sent at 01/15/2024  3:39 PM EST ----- Regarding: in the hospital This patient of mine who is scheduled for my 1040 appointment on Friday is currently in the hospital.  He will not need to see me even if he does get out over the next 48 hours.  I am assuming they will call for another appointment when he gets discharged.  Please cancel this appointment for him and let him know.  Thanks, JL L

## 2024-01-15 NOTE — Plan of Care (Signed)
  Problem: Clinical Measurements: Goal: Will remain free from infection Outcome: Progressing   Problem: Clinical Measurements: Goal: Respiratory complications will improve Outcome: Progressing   Problem: Clinical Measurements: Goal: Cardiovascular complication will be avoided Outcome: Progressing   Problem: Coping: Goal: Level of anxiety will decrease Outcome: Progressing   Problem: Elimination: Goal: Will not experience complications related to bowel motility Outcome: Progressing  Problem: Elimination: Goal: Will not experience complications related to urinary retention Outcome: Progressing

## 2024-01-16 ENCOUNTER — Other Ambulatory Visit (HOSPITAL_COMMUNITY): Payer: Self-pay

## 2024-01-16 DIAGNOSIS — K7682 Hepatic encephalopathy: Secondary | ICD-10-CM | POA: Diagnosis not present

## 2024-01-16 MED ORDER — FUROSEMIDE 20 MG PO TABS
20.0000 mg | ORAL_TABLET | Freq: Every day | ORAL | 0 refills | Status: AC
  Filled 2024-01-16: qty 90, 90d supply, fill #0

## 2024-01-16 MED ORDER — FOLIC ACID 1 MG PO TABS
1.0000 mg | ORAL_TABLET | Freq: Every day | ORAL | 1 refills | Status: AC
  Filled 2024-01-16: qty 30, 30d supply, fill #0

## 2024-01-16 MED ORDER — ADULT MULTIVITAMIN W/MINERALS CH: 1.0000 | ORAL_TABLET | Freq: Every day | ORAL | Status: DC

## 2024-01-16 MED ORDER — B-1 100 MG PO TABS
1.0000 | ORAL_TABLET | Freq: Every day | ORAL | 0 refills | Status: AC
  Filled 2024-01-16: qty 30, 30d supply, fill #0

## 2024-01-16 MED ORDER — LACTULOSE 10 GM/15ML PO SOLN
20.0000 g | ORAL | 1 refills | Status: AC
  Filled 2024-01-16: qty 946, 10d supply, fill #0

## 2024-01-16 MED ORDER — SPIRONOLACTONE 50 MG PO TABS
50.0000 mg | ORAL_TABLET | Freq: Every day | ORAL | 0 refills | Status: AC
  Filled 2024-01-16: qty 90, 90d supply, fill #0

## 2024-01-16 MED ORDER — FAMOTIDINE 20 MG PO TABS
20.0000 mg | ORAL_TABLET | Freq: Two times a day (BID) | ORAL | 0 refills | Status: AC
  Filled 2024-01-16: qty 180, 90d supply, fill #0

## 2024-01-16 NOTE — Plan of Care (Signed)
   Problem: Clinical Measurements: Goal: Diagnostic test results will improve Outcome: Progressing   Problem: Clinical Measurements: Goal: Respiratory complications will improve Outcome: Progressing   Problem: Clinical Measurements: Goal: Cardiovascular complication will be avoided Outcome: Progressing

## 2024-01-17 NOTE — Discharge Summary (Signed)
 "  Physician Discharge Summary  Robert Greene FMW:[id number] DOB: 07/15/1960 DOA: 01/14/2024  PCP: [name], MD  Admit date: 01/14/2024 Discharge date: 01/16/2024  Admitted From: Home Disposition: Home Recommendations for Outpatient Follow-up:  Outpatient follow-up with PCP in 1 to 2 weeks Check CMP and CBC at follow-up Please follow up on the following pending results: None  Home Health: HHPT and RN resumed Equipment/Devices: Patient has appropriate DME's  Discharge Condition: Stable CODE STATUS: Full code   Follow-up Information     [facility] [place] [facility]) Follow up.   Specialty: Home Health Services Why: [facility] will resume PT and nursing after discharge. Contact information: [address] [place]   [address] [phone]        [name], MD. Schedule an appointment as soon as possible for a visit in 1 week(s).   Specialty: Family Medicine Contact information: [address] [place] [address] [phone]                 Hospital course 63 year old M with PMH of alcoholic liver cirrhosis, portal hypertensive gastropathy, HTN, HLD, RA on Enbrel, noncompliance and recent hospitalization for C. difficile for which he was treated with p.o. vancomycin  presented to ED with confusion for 1 day, and admitted with acute hepatic encephalopathy due to noncompliance with medication.  In ED, stable vitals.  Hgb 8.7 (baseline).  CT head without acute finding.  Ammonia is elevated to 57.   Patient was started on lactulose  with improvement in his mental status mod multiple diarrheal episodes.  Diarrhea resolved after holding lactulose .  Had no fever, leukocytosis or abdominal tenderness to suggest C. difficile reactivation.  On the day of discharge, patient was awake and alert and oriented x 4.  No complaints.  Felt well and ready to go home.  Encouraged to stop drinking alcohol, and take  his medications as prescribed.  He will continue lactulose  20 g 1-3 times a day for a goal of 2-3 bowel once a day.  Renewed his prescriptions including lactulose , diuretics and vitamins.  Discontinue losartan  since he remained normotensive without it.  He will continue low-dose Coreg .   See individual problem list below for more.   Problems addressed during this hospitalization Acute hepatic encephalopathy - Will continue lactulose  with goal of 2-3 bowel meds a day. -Encouraged alcohol cessation  Diarrhea due to lactulose : Resolved.  Recent C. difficile infection: Completed course of p.o. vancomycin .  Diarrhea stopped after stopping lactulose .  No fever, leukocytosis or abdominal tenderness to suggest active infection.  Alcoholic liver cirrhosis without ascites -Will continue diuretics and lactulose  -Encouraged alcohol cessation.  Ongoing alcohol abuse: No withdrawal symptoms -Encourage cessation. -Provided with resources. -Renewed prescription for vitamins.  Generalized weakness -Resume home health PT/RN.  Microcytic anemia: Stable.  Likely due to liver cirrhosis and ongoing use of alcohol.  Thrombocytopenia: Improved.   Rheumatoid arthritis: Without evidence of flare.  - Continue Enbrel outpatient  Body mass index is 27.98 kg/m.           Consultations: None  Time spent 35  minutes  Vital signs Vitals:   01/15/24 0600 01/15/24 1351 01/15/24 2042 01/16/24 0539  BP: (!) 111/58 (!) 114/59 130/69 122/83  Pulse: 72 80 76 90  Temp: 98.9 F (37.2 C) 98.4 F (36.9 C) 99.4 F (37.4 C) 99.1 F (37.3 C)  Resp: 16 18 18 20   Height:      Weight:  SpO2: 97% 97% 100% 98%  TempSrc: Oral  Oral Oral  BMI (Calculated):         Discharge exam  GENERAL: No apparent distress.  Nontoxic. HEENT: MMM.  Vision and hearing grossly intact.  NECK: Supple.  No apparent JVD.  RESP:  No IWOB.  Fair aeration bilaterally. CVS:  RRR. Heart sounds normal.  ABD/GI/GU: BS+.  Abd soft, NTND.  MSK/EXT:  Moves extremities. No apparent deformity. No edema.  SKIN: no apparent skin lesion or wound NEURO: Awake and alert. Oriented x 4.  No apparent focal neuro deficit. PSYCH: Calm. Normal affect.   Discharge Instructions Discharge Instructions     Discharge instructions   Complete by: As directed    It has been a pleasure taking care of you!  You were hospitalized due to confusion likely from high ammonia level due to liver cirrhosis.  We strongly recommend you take your medications including lactulose  as prescribed. It is also important that you avoid alcohol. Follow-up with your primary care doctor in 1 to 2 weeks or sooner if needed.   Take care,   Increase activity slowly   Complete by: As directed       Allergies as of 01/16/2024       Reactions   Bee Venom Anaphylaxis, Hives, Rash   Spider Antivenin [s Black Widow (latrodec Mactans) Antivenin] Anaphylaxis, Hives, Rash        Medication List     PAUSE taking these medications    etanercept 50 MG/ML injection Wait to take this until your doctor or other care provider tells you to start again. Commonly known as: ENBREL Inject 50 mg into the skin every Saturday.       STOP taking these medications    losartan  100 MG tablet Commonly known as: COZAAR    vancomycin  125 MG capsule Commonly known as: VANCOCIN        TAKE these medications    carvedilol  3.125 MG tablet Commonly known as: COREG  Take 1 tablet (3.125 mg total) by mouth 2 (two) times daily with a meal.   Constulose  10 GM/15ML solution Generic drug: lactulose  Take 30 mLs (20 g total) by mouth once (1) to three (3) times daily for a goal of 2-3 bowel movements a day. What changed: when to take this   EPINEPHrine  0.3 mg/0.3 mL Soaj injection Commonly known as: EPI-PEN Use once.  If ineffective, use 2nd dose.   famotidine  20 MG tablet Commonly known as: PEPCID  Take 1 tablet (20 mg total) by mouth 2 (two) times daily.    folic acid  1 MG tablet Commonly known as: FOLVITE  Take 1 tablet (1 mg total) by mouth daily.   furosemide  20 MG tablet Commonly known as: Lasix  Take 1 tablet (20 mg total) by mouth daily.   multivitamin with minerals Tabs tablet Take 1 tablet by mouth daily.   spironolactone  50 MG tablet Commonly known as: ALDACTONE  Take 1 tablet (50 mg total) by mouth daily.   thiamine  100 MG tablet Commonly known as: VITAMIN B1 Take 1 tablet (100 mg total) by mouth daily.   triamcinolone  cream 0.1 % Commonly known as: KENALOG Apply 1 Application topically 2 (two) times daily.         Procedures/Studies:   CT Head Wo Contrast Result Date: 01/14/2024 EXAM: CT HEAD WITHOUT CONTRAST 01/14/2024 04:21:06 AM TECHNIQUE: CT of the head was performed without the administration of intravenous contrast. Automated exposure control, iterative reconstruction, and/or weight based adjustment of the mA/kV was utilized to reduce  the radiation dose to as low as reasonably achievable. COMPARISON: CT Head Nov 09, 2023 CLINICAL HISTORY: Mental status change, unknown cause FINDINGS: BRAIN AND VENTRICLES: No acute hemorrhage. No evidence of acute infarct. No hydrocephalus. No extra-axial collection. No mass effect or midline shift. ORBITS: No acute abnormality. SINUSES: No acute abnormality. SOFT TISSUES AND SKULL: No acute soft tissue abnormality. No skull fracture. IMPRESSION: 1. No acute intracranial abnormality. Electronically signed by: Gilmore Molt 01/14/2024 04:25 AM EST RP Workstation: [id number]   CT ABDOMEN PELVIS W CONTRAST Result Date: 01/01/2024 EXAM: CT ABDOMEN AND PELVIS WITH CONTRAST 01/01/2024 06:13:43 AM TECHNIQUE: CT of the abdomen and pelvis was performed with the administration of intravenous contrast. 75 mL of iohexol  (OMNIPAQUE ) 350 MG/ML injection was administered. Multiplanar reformatted images are provided for review. Automated exposure control, iterative reconstruction, and/or  weight-based adjustment of the mA/kV was utilized to reduce the radiation dose to as low as reasonably achievable. COMPARISON: CT with IV contrast 10/10/2023 and 02/27/2023. CLINICAL HISTORY: Diarrhea; Vomiting, bilious (Ped 1-17y). Patient has a known history of cirrhosis and steatosis of the liver. FINDINGS: LOWER CHEST: Asymmetric subpleural fibrosis in the base of the right middle and lower lobes, subpleural reticulation posteriorly in the left lower lobe. Lung bases are unchanged. The cardiac size is normal. LIVER: There is a cirrhotic liver configuration with mild to moderate general steatosis and capsular nodularity. No liver mass is seen. GALLBLADDER AND BILE DUCTS: There is a cluster of 3 or 4 small subcentimeter stones in the proximal gallbladder but no wall thickening or biliary dilatation. SPLEEN: There is mild splenomegaly with an AP dimension of 14.9 cm. There is no mass. PANCREAS: No acute abnormality. ADRENAL GLANDS: No acute abnormality. There is no adrenal mass KIDNEYS, URETERS AND BLADDER: No stones in the kidneys or ureters. No hydronephrosis. No perinephric or periureteral stranding. Urinary bladder is unremarkable. There is no renal mass. GI AND BOWEL: There are thickened folds in the stomach, duodenum, and multiple upper to mid-abdominal small bowel segments, unknown whether this is due to portal hypertension or gastroenteritis. There is fluid in the colon without wall thickening. There is diverticulosis without diverticulitis. There are mild increased mesenteric congestive changes. PERITONEUM AND RETROPERITONEUM: Increased mild abdominal and pelvic free ascites, greater in the right hemiabdomen. There is no free hemorrhage, free air, or localizing collection. VASCULATURE: Aorta is normal in caliber. Moderate to heavy aortoiliac calcific plaques are again seen. LYMPH NODES: There are slightly prominent periportal lymph nodes, but no new or bulky adenopathy. REPRODUCTIVE ORGANS: There is an  enlarged prostate measuring 4.8 cm transverse. BONES AND SOFT TISSUES: Degenerative change noted in thoracic and lumbar spine. No acute or significant osseous findings. No focal soft tissue abnormality. IMPRESSION: 1. Thickened folds in the stomach, duodenum, and multiple upper to mid-abdominal small bowel segments, and in this case could be due to portal hypertension or gastroenteritis. 2. Increased mild abdominal and pelvic ascites, greater in the right hemiabdomen. Increased mesenteric congestive change. 3. Cirrhotic liver morphology with mild to moderate hepatic steatosis; no focal liver mass identified. Mild splenomegaly. 4. Uncomplicated cholelithiasis. 5. Prostatomegaly. Electronically signed by: Francis Quam MD 01/01/2024 06:34 AM EST RP Workstation: [id number]   DG Chest Portable 1 View Result Date: 01/01/2024 EXAM: 1 VIEW(S) XRAY OF THE CHEST 01/01/2024 12:02:59 AM COMPARISON: AP and lateral chest 12/16/2023. CLINICAL HISTORY: weakness FINDINGS: LUNGS AND PLEURA: The lungs are expiratory with bronchovascular crowding and limited view of the bases. There is perihilar and basal coarse linear markings which are probably  due to atelectasis. There is no convincing focal pneumonia. Postsurgical changes are again noted of the right upper lobe. No pleural effusion. No pneumothorax. HEART AND MEDIASTINUM: No acute abnormality of the cardiac and mediastinal silhouettes. BONES AND SOFT TISSUES: Thoracic spondylosis. IMPRESSION: 1. No acute cardiopulmonary process identified. 2. Expiratory film with bronchovascular crowding and limited basal evaluation. 3. Perihilar and basal coarse linear markings, likely atelectasis. 4. Postsurgical changes in the right upper lobe. Electronically signed by: Francis Quam MD 01/01/2024 12:08 AM EST RP Workstation: [id number]       The results of significant diagnostics from this hospitalization (including imaging, microbiology, ancillary and laboratory) are listed below for  reference.     Microbiology: No results found for this or any previous visit (from the past 240 hours).   Labs:  CBC: Recent Labs  Lab 01/14/24 0401 01/15/24 0405  WBC 7.4 4.6  NEUTROABS 4.2  --   HGB 8.7* 8.3*  HCT 28.7* 26.6*  MCV 75.1* 74.1*  PLT 171 114*   BMP &GFR Recent Labs  Lab 01/14/24 0401 01/15/24 0405  NA 138 136  K 4.3 3.6  CL 104 107  CO2 24 20*  GLUCOSE 113* 83  BUN 9 10  CREATININE 0.76 0.68  CALCIUM  9.5 9.2  MG 2.0  --    Estimated Creatinine Clearance: 102.7 mL/min (by C-G formula based on SCr of 0.68 mg/dL). Liver & Pancreas: Recent Labs  Lab 01/14/24 0401 01/15/24 0405  AST 69* 54*  ALT 32 25  ALKPHOS 170* 156*  BILITOT 1.3* 1.9*  PROT 7.4 6.6  ALBUMIN  4.0 3.6   No results for input(s): LIPASE, AMYLASE in the last 168 hours. Recent Labs  Lab 01/14/24 0401  AMMONIA 57*   Diabetic: No results for input(s): HGBA1C in the last 72 hours. No results for input(s): GLUCAP in the last 168 hours. Cardiac Enzymes: No results for input(s): CKTOTAL, CKMB, CKMBINDEX, TROPONINI in the last 168 hours. No results for input(s): PROBNP in the last 8760 hours. Coagulation Profile: No results for input(s): INR, PROTIME in the last 168 hours. Thyroid  Function Tests: No results for input(s): TSH, T4TOTAL, FREET4, T3FREE, THYROIDAB in the last 72 hours. Lipid Profile: No results for input(s): CHOL, HDL, LDLCALC, TRIG, CHOLHDL, LDLDIRECT in the last 72 hours. Anemia Panel: No results for input(s): VITAMINB12, FOLATE, FERRITIN, TIBC, IRON, RETICCTPCT in the last 72 hours. Urine analysis:    Component Value Date/Time   COLORURINE YELLOW 11/09/2023 2107   APPEARANCEUR CLEAR 11/09/2023 2107   LABSPEC 1.003 (L) 11/09/2023 2107   PHURINE 7.0 11/09/2023 2107   GLUCOSEU NEGATIVE 11/09/2023 2107   HGBUR NEGATIVE 11/09/2023 2107   BILIRUBINUR NEGATIVE 11/09/2023 2107   BILIRUBINUR negative  09/16/2019 1114   KETONESUR NEGATIVE 11/09/2023 2107   PROTEINUR NEGATIVE 11/09/2023 2107   UROBILINOGEN 0.2 09/16/2019 1114   UROBILINOGEN 1.0 06/10/2009 1916   NITRITE NEGATIVE 11/09/2023 2107   LEUKOCYTESUR NEGATIVE 11/09/2023 2107   Sepsis Labs: Invalid input(s): PROCALCITONIN, LACTICIDVEN   SIGNED:  Mignon ONEIDA Bump, MD  [facility] 01/17/2024, 8:20 AM   "

## 2024-01-18 ENCOUNTER — Ambulatory Visit: Admitting: Physician Assistant

## 2024-02-07 ENCOUNTER — Emergency Department (HOSPITAL_COMMUNITY)

## 2024-02-07 ENCOUNTER — Encounter (HOSPITAL_COMMUNITY): Payer: Self-pay

## 2024-02-07 ENCOUNTER — Emergency Department (HOSPITAL_COMMUNITY)
Admission: EM | Admit: 2024-02-07 | Discharge: 2024-02-07 | Disposition: A | Discharge status: Home / Self Care | Attending: Emergency Medicine | Admitting: Emergency Medicine

## 2024-02-07 DIAGNOSIS — Z87442 Personal history of urinary calculi: Secondary | ICD-10-CM | POA: Insufficient documentation

## 2024-02-07 DIAGNOSIS — Y908 Blood alcohol level of 240 mg/100 ml or more: Secondary | ICD-10-CM | POA: Insufficient documentation

## 2024-02-07 DIAGNOSIS — F10120 Alcohol abuse with intoxication, uncomplicated: Secondary | ICD-10-CM | POA: Diagnosis not present

## 2024-02-07 DIAGNOSIS — S3992XA Unspecified injury of lower back, initial encounter: Secondary | ICD-10-CM | POA: Diagnosis present

## 2024-02-07 DIAGNOSIS — R4182 Altered mental status, unspecified: Secondary | ICD-10-CM | POA: Insufficient documentation

## 2024-02-07 DIAGNOSIS — S32018A Other fracture of first lumbar vertebra, initial encounter for closed fracture: Secondary | ICD-10-CM | POA: Insufficient documentation

## 2024-02-07 DIAGNOSIS — Y92000 Kitchen of unspecified non-institutional (private) residence as  the place of occurrence of the external cause: Secondary | ICD-10-CM | POA: Diagnosis not present

## 2024-02-07 DIAGNOSIS — I10 Essential (primary) hypertension: Secondary | ICD-10-CM | POA: Insufficient documentation

## 2024-02-07 DIAGNOSIS — S32009A Unspecified fracture of unspecified lumbar vertebra, initial encounter for closed fracture: Secondary | ICD-10-CM

## 2024-02-07 DIAGNOSIS — W01198A Fall on same level from slipping, tripping and stumbling with subsequent striking against other object, initial encounter: Secondary | ICD-10-CM | POA: Insufficient documentation

## 2024-02-07 DIAGNOSIS — F10920 Alcohol use, unspecified with intoxication, uncomplicated: Secondary | ICD-10-CM

## 2024-02-07 LAB — COMPREHENSIVE METABOLIC PANEL WITH GFR
ALT: 26 U/L (ref 0–44)
AST: 69 U/L — ABNORMAL HIGH (ref 15–41)
Albumin: 3.8 g/dL (ref 3.5–5.0)
Alkaline Phosphatase: 134 U/L — ABNORMAL HIGH (ref 38–126)
Anion gap: 12 (ref 5–15)
BUN: 9 mg/dL (ref 8–23)
CO2: 23 mmol/L (ref 22–32)
Calcium: 9.4 mg/dL (ref 8.9–10.3)
Chloride: 105 mmol/L (ref 98–111)
Creatinine, Ser: 0.88 mg/dL (ref 0.61–1.24)
GFR, Estimated: >60 mL/min
Glucose, Bld: 126 mg/dL — ABNORMAL HIGH (ref 70–99)
Potassium: 3.6 mmol/L (ref 3.5–5.1)
Sodium: 140 mmol/L (ref 135–145)
Total Bilirubin: 1.2 mg/dL (ref 0.0–1.2)
Total Protein: 7.8 g/dL (ref 6.5–8.1)

## 2024-02-07 LAB — I-STAT CHEM 8, ED
BUN: 9 mg/dL (ref 8–23)
Calcium, Ion: 1.09 mmol/L — ABNORMAL LOW (ref 1.15–1.40)
Chloride: 104 mmol/L (ref 98–111)
Creatinine, Ser: 1.2 mg/dL (ref 0.61–1.24)
Glucose, Bld: 123 mg/dL — ABNORMAL HIGH (ref 70–99)
HCT: 33 % — ABNORMAL LOW (ref 39.0–52.0)
Hemoglobin: 11.2 g/dL — ABNORMAL LOW (ref 13.0–17.0)
Potassium: 4 mmol/L (ref 3.5–5.1)
Sodium: 143 mmol/L (ref 135–145)
TCO2: 25 mmol/L (ref 22–32)

## 2024-02-07 LAB — CBC WITH DIFFERENTIAL/PLATELET
Abs Immature Granulocytes: 0 K/uL (ref 0.00–0.07)
Basophils Absolute: 0.1 K/uL (ref 0.0–0.1)
Basophils Relative: 2 %
Eosinophils Absolute: 0.1 K/uL (ref 0.0–0.5)
Eosinophils Relative: 3 %
HCT: 32.2 % — ABNORMAL LOW (ref 39.0–52.0)
Hemoglobin: 9.9 g/dL — ABNORMAL LOW (ref 13.0–17.0)
Immature Granulocytes: 0 %
Lymphocytes Relative: 49 %
Lymphs Abs: 2.3 K/uL (ref 0.7–4.0)
MCH: 22.6 pg — ABNORMAL LOW (ref 26.0–34.0)
MCHC: 30.7 g/dL (ref 30.0–36.0)
MCV: 73.5 fL — ABNORMAL LOW (ref 80.0–100.0)
Monocytes Absolute: 0.6 K/uL (ref 0.1–1.0)
Monocytes Relative: 13 %
Neutro Abs: 1.5 K/uL — ABNORMAL LOW (ref 1.7–7.7)
Neutrophils Relative %: 33 %
Platelets: 102 K/uL — ABNORMAL LOW (ref 150–400)
RBC: 4.38 MIL/uL (ref 4.22–5.81)
RDW: 19.5 % — ABNORMAL HIGH (ref 11.5–15.5)
WBC: 4.6 K/uL (ref 4.0–10.5)
nRBC: 0 % (ref 0.0–0.2)

## 2024-02-07 LAB — ETHANOL: Alcohol, Ethyl (B): 262 mg/dL — ABNORMAL HIGH

## 2024-02-07 NOTE — Discharge Instructions (Signed)
Make an appointment to have close follow-up with your primary care doctor.  Return to the emergency room if you have any worsening symptoms. 

## 2024-02-07 NOTE — ED Triage Notes (Addendum)
 Pt arrived via witnessed fall, per daughter ETOH, stumbling around the house, fell into kitchen counter, c/o right rib pain. No LOC, no head strike, no thinners.

## 2024-02-07 NOTE — ED Provider Notes (Signed)
 " [place] EMERGENCY DEPARTMENT AT [facility] Provider Note   CSN: [id number] Arrival date & time: 02/07/24  1050     Patient presents with: Alcohol Intoxication and Fall   Robert Greene is a 64 y.o. male.   Patient is a 64 year old male who presents by EMS with possible intoxication.  Per EMS and family members, he had been drinking vodka this morning and fell, hitting his ribs on the kitchen counter.  He says he hurts all over.  He denies any recent cough or cold symptoms.  No abdominal pain.  No vomiting.  He says he has not had any alcohol in 2 days.       Prior to Admission medications  Medication Sig Start Date End Date Taking? Authorizing Provider  carvedilol  (COREG ) 3.125 MG tablet Take 1 tablet (3.125 mg total) by mouth 2 (two) times daily with a meal. 09/17/23   Rosario Eland I, MD  EPINEPHrine  0.3 mg/0.3 mL IJ SOAJ injection Use once.  If ineffective, use 2nd dose. 11/19/16   Dean Clarity, MD  [Paused] etanercept (ENBREL) 50 MG/ML injection Inject 50 mg into the skin every Saturday. Patient not taking: Reported on 01/14/2024 Wait to take this until your doctor or other care provider tells you to start again.    [provider]  famotidine  (PEPCID ) 20 MG tablet Take 1 tablet (20 mg total) by mouth 2 (two) times daily. 01/16/24   Gonfa, Taye T, MD  folic acid  (FOLVITE ) 1 MG tablet Take 1 tablet (1 mg total) by mouth daily. 01/16/24   Gonfa, Taye T, MD  furosemide  (LASIX ) 20 MG tablet Take 1 tablet (20 mg total) by mouth daily. 01/16/24   Gonfa, Taye T, MD  lactulose  (CHRONULAC ) 10 GM/15ML solution Take 30 mLs (20 g total) by mouth once (1) to three (3) times daily for a goal of 2-3 bowel movements a day. 01/16/24   Gonfa, Taye T, MD  Multiple Vitamin (MULTIVITAMIN WITH MINERALS) TABS tablet Take 1 tablet by mouth daily. 01/16/24   Gonfa, Taye T, MD  spironolactone  (ALDACTONE ) 50 MG tablet Take 1 tablet (50 mg total) by mouth daily. 01/16/24    Gonfa, Taye T, MD  Thiamine  HCl (B-1) 100 MG TABS Take 1 tablet (100 mg total) by mouth daily. 01/16/24   Gonfa, Taye T, MD  triamcinolone  cream (KENALOG) 0.1 % Apply 1 Application topically 2 (two) times daily. Patient not taking: Reported on 01/14/2024 12/07/23   [provider]    Allergies: Bee venom and Spider antivenin [s black widow (latrodec mactans) antivenin]    Review of Systems  Constitutional:  Negative for chills, diaphoresis, fatigue and fever.  HENT:  Negative for congestion, rhinorrhea and sneezing.   Eyes: Negative.   Respiratory:  Negative for cough, chest tightness and shortness of breath.   Cardiovascular:  Negative for chest pain and leg swelling.  Gastrointestinal:  Negative for abdominal pain, diarrhea, nausea and vomiting.  Genitourinary:  Negative for difficulty urinating, flank pain and frequency.  Musculoskeletal:  Positive for arthralgias and back pain.  Skin:  Negative for rash.  Neurological:  Negative for dizziness, speech difficulty, weakness, numbness and headaches.    Updated Vital Signs BP 127/80   Pulse 94   Temp 98.5 F (36.9 C) (Oral)   Resp 18   SpO2 100%   Physical Exam Constitutional:      Appearance: He is well-developed.  HENT:     Head: Normocephalic and atraumatic.  Eyes:  Pupils: Pupils are equal, round, and reactive to light.  Neck:     Comments: No pain to the cervical or thoracic spine.  There is tenderness to the mid lumbosacral spine.  No step-offs or deformities. Cardiovascular:     Rate and Rhythm: Normal rate and regular rhythm.     Heart sounds: Normal heart sounds.  Pulmonary:     Effort: Pulmonary effort is normal. No respiratory distress.     Breath sounds: Normal breath sounds. No wheezing or rales.  Chest:     Chest wall: Tenderness (Positive tenderness to the right ribs, no crepitus or deformity) present.  Abdominal:     General: Bowel sounds are normal.     Palpations: Abdomen is soft.      Tenderness: There is no abdominal tenderness. There is no guarding or rebound.  Musculoskeletal:        General: Normal range of motion.     Comments: He has some chronic skin discoloration to his lower extremities.  Pedal pulses are intact.  He has some generalized pain with movement of both ankles.  There is some pain to his left forearm.  No other pain on palpation or range of motion of the extremities.  Lymphadenopathy:     Cervical: No cervical adenopathy.  Skin:    General: Skin is warm and dry.     Findings: No rash.  Neurological:     General: No focal deficit present.     Mental Status: He is alert and oriented to person, place, and time.     (all labs ordered are listed, but only abnormal results are displayed) Labs Reviewed  COMPREHENSIVE METABOLIC PANEL WITH GFR - Abnormal; Notable for the following components:      Result Value   Glucose, Bld 126 (*)    AST 69 (*)    Alkaline Phosphatase 134 (*)    All other components within normal limits  CBC WITH DIFFERENTIAL/PLATELET - Abnormal; Notable for the following components:   Hemoglobin 9.9 (*)    HCT 32.2 (*)    MCV 73.5 (*)    MCH 22.6 (*)    RDW 19.5 (*)    Platelets 102 (*)    Neutro Abs 1.5 (*)    All other components within normal limits  ETHANOL - Abnormal; Notable for the following components:   Alcohol, Ethyl (B) 262 (*)    All other components within normal limits  I-STAT CHEM 8, ED - Abnormal; Notable for the following components:   Glucose, Bld 123 (*)    Calcium , Ion 1.09 (*)    Hemoglobin 11.2 (*)    HCT 33.0 (*)    All other components within normal limits    EKG: EKG Interpretation Date/Time:  Thursday February 07 2024 13:11:24 EST Ventricular Rate:  105 PR Interval:  176 QRS Duration:  76 QT Interval:  410 QTC Calculation: 541 R Axis:   36  Text Interpretation: Sinus rhythm with Premature supraventricular complexes and with frequent Premature ventricular complexes Right atrial enlargement  Marked ST abnormality, possible anteroseptal subendocardial injury Prolonged QT Abnormal ECG since last tracing no significant change Confirmed by Lenor Hollering [phone]) on 02/07/2024 2:28:05 PM  Radiology: [facility] Forearm Left Result Date: 02/07/2024 CLINICAL DATA:  Left arm pain after fall EXAM: LEFT FOREARM - 2 VIEW COMPARISON:  None Available. FINDINGS: There is no evidence of fracture or other focal bone lesions. Soft tissues are unremarkable. IMPRESSION: Negative. Electronically Signed   By: Lynwood Landy Mickey CHRISTELLA.D.  On: 02/07/2024 13:54   DG Chest 1 View Result Date: 02/07/2024 CLINICAL DATA:  Fall EXAM: CHEST  1 VIEW COMPARISON:  December 31, 2023 FINDINGS: The heart size and mediastinal contours are within normal limits. Left lung is clear. Minimal right basilar subsegmental atelectasis or scarring is noted. The visualized skeletal structures are unremarkable. IMPRESSION: Minimal right basilar subsegmental atelectasis or scarring. Electronically Signed   By: Lynwood Landy Raddle M.D.   On: 02/07/2024 13:52   CT Lumbar Spine Wo Contrast Result Date: 02/07/2024 EXAM: CT OF THE LUMBAR SPINE WITHOUT CONTRAST 02/07/2024 12:53:18 PM TECHNIQUE: CT of the lumbar spine was performed without the administration of intravenous contrast. Multiplanar reformatted images are provided for review. Automated exposure control, iterative reconstruction, and/or weight based adjustment of the mA/kV was utilized to reduce the radiation dose to as low as reasonably achievable. COMPARISON: None available. CLINICAL HISTORY: Back trauma, no prior imaging (Age >= 16y) FINDINGS: BONES AND ALIGNMENT: Normal vertebral body heights. There is no evidence of acute compression fracture in the lumbar spine. Possible nondisplaced fractures of the right L1 transverse process. No suspicious bone lesion. Normal alignment. DEGENERATIVE CHANGES: Degenerative endplate osteophytes in the lumbar spine. There is disc space narrowing at L4-L5 with associated  vacuum disc phenomenon. Disc bulge and facet arthrosis at L2-L3 resulting in mild spinal canal stenosis. Additional disc bulge and facet arthrosis at L3-L4 resulting in mild spinal canal stenosis. Disc bulge and facet arthrosis at L4-L5 resulting in mild spinal canal stenosis. There is an asymmetric disc bulge at L5-S1 resulting in lateral recess narrowing on the right. There is moderate right and mild left foraminal stenosis at L4-L5. Additional mild foraminal stenosis on the right at L5-S1. Degenerative changes of the bilateral sacroiliac joints. SOFT TISSUES: No acute abnormality. VASCULATURE: Moderate atherosclerosis of the abdominal aorta and branch vessels. IMPRESSION: 1. Possible nondisplaced fractures of the right L1 transverse process. 2. No evidence of acute compression fracture in the lumbar spine. No traumatic malalignment. Electronically signed by: Donnice Mania MD 02/07/2024 01:27 PM EST RP Workstation: [id number]   CT Head Wo Contrast Result Date: 02/07/2024 CLINICAL DATA:  Fall. EXAM: CT HEAD WITHOUT CONTRAST CT CERVICAL SPINE WITHOUT CONTRAST TECHNIQUE: Multidetector CT imaging of the head and cervical spine was performed following the standard protocol without intravenous contrast. Multiplanar CT image reconstructions of the cervical spine were also generated. RADIATION DOSE REDUCTION: This exam was performed according to the departmental dose-optimization program which includes automated exposure control, adjustment of the mA and/or kV according to patient size and/or use of iterative reconstruction technique. COMPARISON:  Head CT 01/14/2024 and head/cervical spine CT 11/09/2023 FINDINGS: CT HEAD FINDINGS Brain: No evidence of acute infarction, hemorrhage, hydrocephalus, extra-axial collection or mass lesion/mass effect. Vascular: No hyperdense vessel or unexpected calcification. Skull: Normal. Negative for fracture or focal lesion. Sinuses/Orbits: No acute finding. Other: None. CT CERVICAL  SPINE FINDINGS Alignment: Normal. Skull base and vertebrae: Moderate spondylosis of the cervical spine to include uncovertebral joint spurring and facet arthropathy. Vertebral body heights are maintained. No acute fracture. Mild to moderate left-sided neural foraminal narrowing at the C3-4 level. Moderate bilateral neural from narrowing at the C5-6 level and moderate left-sided neural from narrowing at the C6-7 level. Soft tissues and spinal canal: Mild narrowing of the AP diameter of the spinal canal at the C5-6 and C6-7 levels due to adjacent posterior spurring. Disc levels: Mild disc space narrowing from the C4-5 to the C6-7 levels. Upper chest: No acute findings. Other: None. IMPRESSION: 1. No  acute brain injury. 2. No acute cervical spine injury. 3. 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 Electronically Signed   By: Toribio Agreste M.D.   On: 02/07/2024 13:05   CT Cervical Spine Wo Contrast Result Date: 02/07/2024 CLINICAL DATA:  Fall. EXAM: CT HEAD WITHOUT CONTRAST CT CERVICAL SPINE WITHOUT CONTRAST TECHNIQUE: Multidetector CT imaging of the head and cervical spine was performed following the standard protocol without intravenous contrast. Multiplanar CT image reconstructions of the cervical spine were also generated. RADIATION DOSE REDUCTION: This exam was performed according to the departmental dose-optimization program which includes automated exposure control, adjustment of the mA and/or kV according to patient size and/or use of iterative reconstruction technique. COMPARISON:  Head CT 01/14/2024 and head/cervical spine CT 11/09/2023 FINDINGS: CT HEAD FINDINGS Brain: No evidence of acute infarction, hemorrhage, hydrocephalus, extra-axial collection or mass lesion/mass effect. Vascular: No hyperdense vessel or unexpected calcification. Skull: Normal. Negative for fracture or focal lesion. Sinuses/Orbits: No  acute finding. Other: None. CT CERVICAL SPINE FINDINGS Alignment: Normal. Skull base and vertebrae: Moderate spondylosis of the cervical spine to include uncovertebral joint spurring and facet arthropathy. Vertebral body heights are maintained. No acute fracture. Mild to moderate left-sided neural foraminal narrowing at the C3-4 level. Moderate bilateral neural from narrowing at the C5-6 level and moderate left-sided neural from narrowing at the C6-7 level. Soft tissues and spinal canal: Mild narrowing of the AP diameter of the spinal canal at the C5-6 and C6-7 levels due to adjacent posterior spurring. Disc levels: Mild disc space narrowing from the C4-5 to the C6-7 levels. Upper chest: No acute findings. Other: None. IMPRESSION: 1. No acute brain injury. 2. No acute cervical spine injury. 3. 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 Electronically Signed   By: Toribio Agreste M.D.   On: 02/07/2024 13:05     Procedures   Medications Ordered in the ED - No data to display                                  Medical Decision Making Amount and/or Complexity of Data Reviewed Labs: ordered. Radiology: ordered.   This patient presents to the ED for concern of fall, this involves an extensive number of treatment options, and is a complaint that carries with it a high risk of complications and morbidity.  I considered the following differential and admission for this acute, potentially life threatening condition.  The differential diagnosis includes alcohol intoxication, withdrawal, infection, intracranial hemorrhage, spinal injury, extremity fracture, rib fractures, pneumothorax, intra-abdominal trauma  MDM:    Patient is a 64 year old who presents after a fall.  He denied that he had used any alcohol today however his alcohol level is 262 which would suggest otherwise.  His labs showed anemia which is similar  to prior values.  His LFTs are minimally elevated but similar to prior values.  He has some thrombocytopenia which appears to be chronic.  CT of his head, cervical spine and lumbosacral spine show possible transverse process fracture of L1 but otherwise no acute traumatic injuries.  X-rays of his left forearm do not reveal any fracture.  I had ordered x-rays of his ankles but he declines these.  He initially refused rib x-rays but he did agree to a 1 view chest which was performed.  No pneumothorax  or other acute abnormality was noted.  Will need monitoring here in the ED for metabolization of his alcohol.  Care turned over to Dr. Zammit.  (Labs, imaging, consults)  Labs: I Ordered, and personally interpreted labs.  The pertinent results include: Thrombocytopenia, anemia, mild elevation in his LFTs.  Elevated EtOH.  Imaging Studies ordered: I ordered imaging studies including CT head, CT cervical spine, CT lumbosacral spine, x-ray left forearm, chest x-ray I independently visualized and interpreted imaging. I agree with the radiologist interpretation  Additional history obtained from EMS.  External records from outside source obtained and reviewed including prior notes  Cardiac Monitoring: The patient was not maintained on a cardiac monitor.  If on the cardiac monitor, I personally viewed and interpreted the cardiac monitored which showed an underlying rhythm of:    Reevaluation: After the interventions noted above, I reevaluated the patient and found that they have :improved  Social Determinants of Health:  alcohol use  Disposition: Pending  Co morbidities that complicate the patient evaluation  Past Medical History:  Diagnosis Date   Alcoholism (HCC)    Anxiety    Arthritis    rheumatoid   Cirrhosis (HCC)    Coronary artery calcification seen on CAT scan 04/08/2018   Dyspnea    HTN (hypertension) 04/08/2018   Hypercholesteremia    Hypertension    Interstitial lung disease  (HCC)    Kidney stones 2020   Pulmonary fibrosis (HCC)    Rheumatoid arthritis (HCC)      Medicines No orders of the defined types were placed in this encounter.   I have reviewed the patients home medicines and have made adjustments as needed  Problem List / ED Course: Problem List Items Addressed This Visit   None Visit Diagnoses       Alcoholic intoxication without complication    -  Primary     Closed fracture of transverse process of lumbar vertebra, initial encounter [facility])                    Final diagnoses:  Alcoholic intoxication without complication  Closed fracture of transverse process of lumbar vertebra, initial encounter [facility])    ED Discharge Orders     None          Lenor Hollering, MD 02/07/24 1621  "

## 2024-02-14 ENCOUNTER — Inpatient Hospital Stay (HOSPITAL_COMMUNITY)
Admission: EM | Admit: 2024-02-14 | Source: Home / Self Care | Attending: Internal Medicine | Admitting: Internal Medicine

## 2024-02-14 ENCOUNTER — Other Ambulatory Visit: Payer: Self-pay

## 2024-02-14 ENCOUNTER — Encounter (HOSPITAL_COMMUNITY): Payer: Self-pay

## 2024-02-14 DIAGNOSIS — K922 Gastrointestinal hemorrhage, unspecified: Secondary | ICD-10-CM | POA: Diagnosis present

## 2024-02-14 DIAGNOSIS — K921 Melena: Principal | ICD-10-CM

## 2024-02-14 DIAGNOSIS — F10931 Alcohol use, unspecified with withdrawal delirium: Secondary | ICD-10-CM

## 2024-02-14 DIAGNOSIS — I8500 Esophageal varices without bleeding: Secondary | ICD-10-CM | POA: Diagnosis present

## 2024-02-14 DIAGNOSIS — E7220 Disorder of urea cycle metabolism, unspecified: Secondary | ICD-10-CM

## 2024-02-14 DIAGNOSIS — D649 Anemia, unspecified: Secondary | ICD-10-CM

## 2024-02-14 DIAGNOSIS — J690 Pneumonitis due to inhalation of food and vomit: Secondary | ICD-10-CM | POA: Insufficient documentation

## 2024-02-14 DIAGNOSIS — K7030 Alcoholic cirrhosis of liver without ascites: Secondary | ICD-10-CM | POA: Diagnosis present

## 2024-02-14 LAB — IRON AND TIBC
Iron: 24 ug/dL — ABNORMAL LOW (ref 45–182)
Saturation Ratios: 6 % — ABNORMAL LOW (ref 17.9–39.5)
TIBC: 413 ug/dL (ref 250–450)
UIBC: 389 ug/dL

## 2024-02-14 LAB — URINE DRUG SCREEN
Amphetamines: NEGATIVE
Barbiturates: NEGATIVE
Benzodiazepines: NEGATIVE
Cocaine: NEGATIVE
Fentanyl: NEGATIVE
Methadone Scn, Ur: NEGATIVE
Opiates: NEGATIVE
Tetrahydrocannabinol: NEGATIVE

## 2024-02-14 LAB — COMPREHENSIVE METABOLIC PANEL WITH GFR
ALT: 26 U/L (ref 0–44)
AST: 67 U/L — ABNORMAL HIGH (ref 15–41)
Albumin: 3.6 g/dL (ref 3.5–5.0)
Alkaline Phosphatase: 132 U/L — ABNORMAL HIGH (ref 38–126)
Anion gap: 14 (ref 5–15)
BUN: 13 mg/dL (ref 8–23)
CO2: 23 mmol/L (ref 22–32)
Calcium: 8.9 mg/dL (ref 8.9–10.3)
Chloride: 100 mmol/L (ref 98–111)
Creatinine, Ser: 0.82 mg/dL (ref 0.61–1.24)
GFR, Estimated: >60 mL/min
Glucose, Bld: 139 mg/dL — ABNORMAL HIGH (ref 70–99)
Potassium: 3.6 mmol/L (ref 3.5–5.1)
Sodium: 136 mmol/L (ref 135–145)
Total Bilirubin: 1 mg/dL (ref 0.0–1.2)
Total Protein: 6.9 g/dL (ref 6.5–8.1)

## 2024-02-14 LAB — FOLATE: Folate: >20 ng/mL

## 2024-02-14 LAB — CBC
HCT: 26.7 % — ABNORMAL LOW (ref 39.0–52.0)
Hemoglobin: 8.5 g/dL — ABNORMAL LOW (ref 13.0–17.0)
MCH: 22.6 pg — ABNORMAL LOW (ref 26.0–34.0)
MCHC: 31.8 g/dL (ref 30.0–36.0)
MCV: 71 fL — ABNORMAL LOW (ref 80.0–100.0)
Platelets: 57 K/uL — ABNORMAL LOW (ref 150–400)
RBC: 3.76 MIL/uL — ABNORMAL LOW (ref 4.22–5.81)
RDW: 19.3 % — ABNORMAL HIGH (ref 11.5–15.5)
WBC: 4.4 K/uL (ref 4.0–10.5)
nRBC: 0 % (ref 0.0–0.2)

## 2024-02-14 LAB — RETICULOCYTES
Immature Retic Fract: 36.7 % — ABNORMAL HIGH (ref 2.3–15.9)
RBC.: 3.26 MIL/uL — ABNORMAL LOW (ref 4.22–5.81)
Retic Count, Absolute: 31.9 K/uL (ref 19.0–186.0)
Retic Ct Pct: 1 % (ref 0.4–3.1)

## 2024-02-14 LAB — AMMONIA: Ammonia: 306 umol/L — ABNORMAL HIGH (ref 9–35)

## 2024-02-14 LAB — PROTIME-INR
INR: 1.2 (ref 0.8–1.2)
Prothrombin Time: 16.2 s — ABNORMAL HIGH (ref 11.4–15.2)

## 2024-02-14 LAB — URINALYSIS, ROUTINE W REFLEX MICROSCOPIC
Bacteria, UA: NONE SEEN
Bilirubin Urine: NEGATIVE
Glucose, UA: NEGATIVE mg/dL
Hgb urine dipstick: NEGATIVE
Ketones, ur: NEGATIVE mg/dL
Leukocytes,Ua: NEGATIVE
Nitrite: NEGATIVE
Protein, ur: NEGATIVE mg/dL
Specific Gravity, Urine: 1.016 (ref 1.005–1.030)
pH: 6 (ref 5.0–8.0)

## 2024-02-14 LAB — HEMATOCRIT
HCT: 21 % — ABNORMAL LOW (ref 39.0–52.0)
HCT: 23.7 % — ABNORMAL LOW (ref 39.0–52.0)

## 2024-02-14 LAB — POC OCCULT BLOOD, ED: Fecal Occult Bld: POSITIVE — AB

## 2024-02-14 LAB — T4, FREE: Free T4: 1.28 ng/dL (ref 0.80–2.00)

## 2024-02-14 LAB — TSH: TSH: 0.204 u[IU]/mL — ABNORMAL LOW (ref 0.350–4.500)

## 2024-02-14 LAB — HEMOGLOBIN
Hemoglobin: 6.7 g/dL — CL (ref 13.0–17.0)
Hemoglobin: 7.4 g/dL — ABNORMAL LOW (ref 13.0–17.0)

## 2024-02-14 LAB — MAGNESIUM: Magnesium: 1.7 mg/dL (ref 1.7–2.4)

## 2024-02-14 LAB — ETHANOL: Alcohol, Ethyl (B): 141 mg/dL — ABNORMAL HIGH

## 2024-02-14 LAB — VITAMIN B12: Vitamin B-12: 953 pg/mL — ABNORMAL HIGH (ref 180–914)

## 2024-02-14 LAB — FERRITIN: Ferritin: 24 ng/mL (ref 24–336)

## 2024-02-14 LAB — LIPASE, BLOOD: Lipase: 86 U/L — ABNORMAL HIGH (ref 11–51)

## 2024-02-14 MED ORDER — SODIUM CHLORIDE 0.9% IV SOLUTION: Freq: Once | INTRAVENOUS | Status: AC

## 2024-02-14 MED ORDER — LORAZEPAM 1 MG PO TABS: 1.0000 mg | ORAL_TABLET | ORAL | Status: DC | PRN

## 2024-02-14 MED ORDER — ONDANSETRON HCL 4 MG/2ML IJ SOLN
4.0000 mg | Freq: Once | INTRAMUSCULAR | Status: AC
  Administered 2024-02-14: 4 mg via INTRAVENOUS
  Filled 2024-02-14: qty 2

## 2024-02-14 MED ORDER — PANTOPRAZOLE SODIUM 40 MG IV SOLR
80.0000 mg | Freq: Once | INTRAVENOUS | Status: AC
  Administered 2024-02-14: 80 mg via INTRAVENOUS
  Filled 2024-02-14: qty 20

## 2024-02-14 MED ORDER — PANTOPRAZOLE SODIUM 40 MG IV SOLR
40.0000 mg | Freq: Two times a day (BID) | INTRAVENOUS | Status: DC
  Administered 2024-02-15 – 2024-02-22 (×15): 40 mg via INTRAVENOUS
  Filled 2024-02-14 (×15): qty 10

## 2024-02-14 MED ORDER — ACETAMINOPHEN 650 MG RE SUPP
650.0000 mg | Freq: Four times a day (QID) | RECTAL | Status: DC | PRN
  Administered 2024-02-16: 650 mg via RECTAL
  Filled 2024-02-14: qty 1

## 2024-02-14 MED ORDER — LACTATED RINGERS IV BOLUS
1000.0000 mL | Freq: Once | INTRAVENOUS | Status: AC
  Administered 2024-02-14: 1000 mL via INTRAVENOUS

## 2024-02-14 MED ORDER — THIAMINE HCL 100 MG/ML IJ SOLN
500.0000 mg | Freq: Once | INTRAVENOUS | Status: AC
  Administered 2024-02-14: 500 mg via INTRAVENOUS
  Filled 2024-02-14: qty 500

## 2024-02-14 MED ORDER — THIAMINE HCL 100 MG/ML IJ SOLN
100.0000 mg | Freq: Every day | INTRAMUSCULAR | Status: DC
  Administered 2024-02-15 – 2024-02-21 (×7): 100 mg via INTRAVENOUS
  Filled 2024-02-14 (×7): qty 2

## 2024-02-14 MED ORDER — THIAMINE MONONITRATE 100 MG PO TABS
100.0000 mg | ORAL_TABLET | Freq: Every day | ORAL | Status: AC
  Administered 2024-02-14 – 2024-02-29 (×8): 100 mg via ORAL
  Filled 2024-02-14 (×11): qty 1

## 2024-02-14 MED ORDER — LORAZEPAM 2 MG/ML IJ SOLN
1.0000 mg | INTRAMUSCULAR | Status: DC | PRN
  Administered 2024-02-15 – 2024-02-16 (×2): 2 mg via INTRAVENOUS
  Administered 2024-02-16: 1 mg via INTRAVENOUS
  Administered 2024-02-16: 3 mg via INTRAVENOUS
  Administered 2024-02-16: 1 mg via INTRAVENOUS
  Administered 2024-02-16: 2 mg via INTRAVENOUS
  Administered 2024-02-16: 1 mg via INTRAVENOUS
  Administered 2024-02-17 (×2): 3 mg via INTRAVENOUS
  Administered 2024-02-17 (×3): 2 mg via INTRAVENOUS
  Administered 2024-02-17: 3 mg via INTRAVENOUS
  Filled 2024-02-14 (×2): qty 1
  Filled 2024-02-14: qty 2
  Filled 2024-02-14 (×3): qty 1
  Filled 2024-02-14: qty 2
  Filled 2024-02-14 (×3): qty 1
  Filled 2024-02-14: qty 2
  Filled 2024-02-14: qty 1
  Filled 2024-02-14: qty 2
  Filled 2024-02-14 (×2): qty 1

## 2024-02-14 MED ORDER — HYDRALAZINE HCL 20 MG/ML IJ SOLN: 5.0000 mg | INTRAMUSCULAR | Status: DC | PRN

## 2024-02-14 MED ORDER — ADULT MULTIVITAMIN W/MINERALS CH
1.0000 | ORAL_TABLET | Freq: Every day | ORAL | Status: DC
  Administered 2024-02-14 – 2024-02-21 (×3): 1 via ORAL
  Filled 2024-02-14 (×6): qty 1

## 2024-02-14 MED ORDER — SODIUM CHLORIDE 0.9 % IV SOLN
1.0000 g | INTRAVENOUS | Status: AC
  Administered 2024-02-14 – 2024-02-18 (×5): 1 g via INTRAVENOUS
  Filled 2024-02-14 (×5): qty 10

## 2024-02-14 MED ORDER — LACTULOSE 10 GM/15ML PO SOLN
20.0000 g | Freq: Three times a day (TID) | ORAL | Status: DC
  Administered 2024-02-14 – 2024-02-15 (×2): 20 g via ORAL
  Filled 2024-02-14 (×2): qty 30

## 2024-02-14 MED ORDER — SODIUM CHLORIDE 0.9 % IV SOLN: 2.0000 g | INTRAVENOUS | Status: DC

## 2024-02-14 MED ORDER — MORPHINE SULFATE (PF) 2 MG/ML IV SOLN: 2.0000 mg | INTRAVENOUS | Status: DC | PRN

## 2024-02-14 MED ORDER — FOLIC ACID 1 MG PO TABS
1.0000 mg | ORAL_TABLET | Freq: Every day | ORAL | Status: DC
  Administered 2024-02-14: 1 mg via ORAL
  Filled 2024-02-14: qty 1

## 2024-02-14 MED ORDER — ACETAMINOPHEN 325 MG PO TABS
650.0000 mg | ORAL_TABLET | Freq: Four times a day (QID) | ORAL | Status: DC | PRN
  Administered 2024-02-25 – 2024-02-26 (×4): 650 mg via ORAL
  Filled 2024-02-14 (×5): qty 2

## 2024-02-14 NOTE — ED Notes (Signed)
 Pt satting 88% on [place] 2L applied

## 2024-02-14 NOTE — ED Provider Notes (Signed)
 "  EMERGENCY DEPARTMENT AT [facility] Provider Note   CSN: [id number] Arrival date & time: 02/14/24  1449     Patient presents with: Black Tarry Stools   Robert Greene is a 64 y.o. male.   Patient is a 64 year old male with a history of alcohol use disorder who presents with black tarry stools.  He said he woke up this morning and has had several black stools.  He has some dry heaves but no hematemesis.  No associate abdominal pain.  No fevers.  Last alcohol use was yesterday per his report.  No fevers, no cough or cold symptoms.       Prior to Admission medications  Medication Sig Start Date End Date Taking? Authorizing Provider  carvedilol  (COREG ) 3.125 MG tablet Take 1 tablet (3.125 mg total) by mouth 2 (two) times daily with a meal. 09/17/23  Yes Rosario Leatrice FERNS, MD  diphenhydrAMINE  (BENADRYL  ALLERGY) 25 MG tablet Take 25 mg by mouth every 6 (six) hours as needed for itching.   Yes [provider]  EPINEPHrine  0.3 mg/0.3 mL IJ SOAJ injection Use once.  If ineffective, use 2nd dose. 11/19/16  Yes Dean Clarity, MD  famotidine  (PEPCID ) 20 MG tablet Take 1 tablet (20 mg total) by mouth 2 (two) times daily. 01/16/24  Yes Gonfa, Taye T, MD  folic acid  (FOLVITE ) 1 MG tablet Take 1 tablet (1 mg total) by mouth daily. 01/16/24  Yes Gonfa, Taye T, MD  furosemide  (LASIX ) 20 MG tablet Take 1 tablet (20 mg total) by mouth daily. 01/16/24  Yes Gonfa, Taye T, MD  hydrocortisone cream 1 % Apply 1 Application topically 2 (two) times daily as needed for itching (if out of triamcinolone  cream).   Yes [provider]  lactulose  (CHRONULAC ) 10 GM/15ML solution Take 30 mLs (20 g total) by mouth once (1) to three (3) times daily for a goal of 2-3 bowel movements a day. 01/16/24  Yes Gonfa, Taye T, MD  spironolactone  (ALDACTONE ) 50 MG tablet Take 1 tablet (50 mg total) by mouth daily. 01/16/24  Yes Gonfa, Taye T, MD  Thiamine  HCl (B-1) 100 MG TABS Take 1  tablet (100 mg total) by mouth daily. 01/16/24  Yes Gonfa, Taye T, MD  triamcinolone  cream (KENALOG) 0.1 % Apply 1 Application topically 2 (two) times daily. 12/07/23  Yes [provider]  [Paused] etanercept (ENBREL) 50 MG/ML injection Inject 50 mg into the skin every Saturday. Patient not taking: Reported on 01/14/2024 Wait to take this until your doctor or other care provider tells you to start again.    [provider]    Allergies: Bee venom and Spider antivenin [s black widow (latrodec mactans) antivenin]    Review of Systems  Constitutional:  Positive for fatigue. Negative for chills, diaphoresis and fever.  HENT:  Negative for congestion, rhinorrhea and sneezing.   Eyes: Negative.   Respiratory:  Negative for cough, chest tightness and shortness of breath.   Cardiovascular:  Negative for chest pain and leg swelling.  Gastrointestinal:  Positive for blood in stool and nausea. Negative for abdominal pain, diarrhea and vomiting.  Genitourinary:  Negative for difficulty urinating, flank pain and frequency.  Musculoskeletal:  Negative for arthralgias and back pain.  Skin:  Negative for rash.  Neurological:  Negative for dizziness, speech difficulty, weakness, numbness and headaches.    Updated Vital Signs BP 126/76   Pulse 94   Temp 98.5 F (36.9 C) (Oral)   Resp 14  Ht 5' 9 (1.753 m)   Wt 90.7 kg   SpO2 100%   BMI 29.53 kg/m   Physical Exam Constitutional:      Appearance: He is well-developed.  HENT:     Head: Normocephalic and atraumatic.  Eyes:     Pupils: Pupils are equal, round, and reactive to light.  Cardiovascular:     Rate and Rhythm: Normal rate and regular rhythm.     Heart sounds: Normal heart sounds.  Pulmonary:     Effort: Pulmonary effort is normal. No respiratory distress.     Breath sounds: Normal breath sounds. No wheezing or rales.  Chest:     Chest wall: No tenderness.  Abdominal:     General: Bowel sounds are normal.      Palpations: Abdomen is soft.     Tenderness: There is no abdominal tenderness. There is no guarding or rebound.  Musculoskeletal:        General: Normal range of motion.     Cervical back: Normal range of motion and neck supple.     Comments: Mild tremor noted, no focal deficits  Lymphadenopathy:     Cervical: No cervical adenopathy.  Skin:    General: Skin is warm and dry.     Findings: No rash.  Neurological:     General: No focal deficit present.     Mental Status: He is alert and oriented to person, place, and time.     Comments: He is oriented x 3 but slow to answer questions     (all labs ordered are listed, but only abnormal results are displayed) Labs Reviewed  COMPREHENSIVE METABOLIC PANEL WITH GFR - Abnormal; Notable for the following components:      Result Value   Glucose, Bld 139 (*)    AST 67 (*)    Alkaline Phosphatase 132 (*)    All other components within normal limits  CBC - Abnormal; Notable for the following components:   RBC 3.76 (*)    Hemoglobin 8.5 (*)    HCT 26.7 (*)    MCV 71.0 (*)    MCH 22.6 (*)    RDW 19.3 (*)    Platelets 57 (*)    All other components within normal limits  LIPASE, BLOOD - Abnormal; Notable for the following components:   Lipase 86 (*)    All other components within normal limits  ETHANOL - Abnormal; Notable for the following components:   Alcohol, Ethyl (B) 141 (*)    All other components within normal limits  PROTIME-INR - Abnormal; Notable for the following components:   Prothrombin Time 16.2 (*)    All other components within normal limits  AMMONIA - Abnormal; Notable for the following components:   Ammonia 306 (*)    All other components within normal limits  HEMOGLOBIN - Abnormal; Notable for the following components:   Hemoglobin 7.4 (*)    All other components within normal limits  HEMATOCRIT - Abnormal; Notable for the following components:   HCT 23.7 (*)    All other components within normal limits  VITAMIN  B12 - Abnormal; Notable for the following components:   Vitamin B-12 953 (*)    All other components within normal limits  IRON AND TIBC - Abnormal; Notable for the following components:   Iron 24 (*)    Saturation Ratios 6 (*)    All other components within normal limits  RETICULOCYTES - Abnormal; Notable for the following components:   RBC. 3.26 (*)  Immature Retic Fract 36.7 (*)    All other components within normal limits  TSH - Abnormal; Notable for the following components:   TSH 0.204 (*)    All other components within normal limits  POC OCCULT BLOOD, ED - Abnormal; Notable for the following components:   Fecal Occult Bld POSITIVE (*)    All other components within normal limits  URINE DRUG SCREEN  URINALYSIS, ROUTINE W REFLEX MICROSCOPIC  MAGNESIUM   FOLATE  FERRITIN  T4, FREE  HEMOGLOBIN  HEMATOCRIT  COMPREHENSIVE METABOLIC PANEL WITH GFR  CBC  MAGNESIUM   TYPE AND SCREEN    EKG: None  Radiology: No results found.   Procedures   Medications Ordered in the ED  acetaminophen  (TYLENOL ) tablet 650 mg (has no administration in time range)    Or  acetaminophen  (TYLENOL ) suppository 650 mg (has no administration in time range)  morphine  (PF) 2 MG/ML injection 2 mg (has no administration in time range)  hydrALAZINE  (APRESOLINE ) injection 5 mg (has no administration in time range)  pantoprazole  (PROTONIX ) injection 40 mg (has no administration in time range)  lactulose  (CHRONULAC ) 10 GM/15ML solution 20 g (20 g Oral Given 02/14/24 2105)  cefTRIAXone  (ROCEPHIN ) 1 g in sodium chloride  0.9 % 100 mL IVPB (0 g Intravenous Stopped 02/14/24 2139)  thiamine  (VITAMIN B1) tablet 100 mg (100 mg Oral Given 02/14/24 2101)    Or  thiamine  (VITAMIN B1) injection 100 mg ( Intravenous See Alternative 02/14/24 2101)  folic acid  (FOLVITE ) tablet 1 mg (1 mg Oral Given 02/14/24 2102)  multivitamin with minerals tablet 1 tablet (1 tablet Oral Given 02/14/24 2102)  LORazepam  (ATIVAN ) tablet  1-4 mg (has no administration in time range)    Or  LORazepam  (ATIVAN ) injection 1-4 mg (has no administration in time range)  lactated ringers  bolus 1,000 mL (0 mLs Intravenous Stopped 02/14/24 1714)  ondansetron  (ZOFRAN ) injection 4 mg (4 mg Intravenous Given 02/14/24 1529)  pantoprazole  (PROTONIX ) injection 80 mg (80 mg Intravenous Given 02/14/24 1527)  pantoprazole  (PROTONIX ) injection 80 mg (80 mg Intravenous Given 02/14/24 1713)  thiamine  (VITAMIN B1) 500 mg in sodium chloride  0.9 % 50 mL IVPB (0 mg Intravenous Stopped 02/14/24 1924)                                    Medical Decision Making Amount and/or Complexity of Data Reviewed Labs: ordered.  Risk OTC drugs. Prescription drug management. Decision regarding hospitalization.   This patient presents to the ED for concern of melena, this involves an extensive number of treatment options, and is a complaint that carries with it a high risk of complications and morbidity.  I considered the following differential and admission for this acute, potentially life threatening condition.  The differential diagnosis includes GI bleed, alcohol withdrawal, abdominal infection, abdominal perforation, anemia  MDM:    Patient is a 64 year old who presents with black tarry stools.  His vital signs are stable.  His labs show an anemia which is a drop from his last most recent vitals.  He does not have associated abdominal tenderness.  His ammonia level is markedly elevated.  His alcohol is elevated at 141.  Lipase is mildly elevated appears to be chronically elevated.  LFTs are mildly elevated but similar to baseline values.  Rectal exam does show small amount of gross blood.  He was given IV Protonix .  Discussed with Dr. Tobie who will admit the patient.  Discussed with Dr. Abran with gastroenterology who will see the patient tomorrow.  (Labs, imaging, consults)  Labs: I Ordered, and personally interpreted labs.  The pertinent results include:  Anemia, elevated ammonia level, baseline creatinine  Imaging Studies ordered: I ordered imaging studies including   I independently visualized and interpreted imaging. I agree with the radiologist interpretation  Additional history obtained from  .  External records from outside source obtained and reviewed including prior notes  Cardiac Monitoring: The patient was not maintained on a cardiac monitor.  If on the cardiac monitor, I personally viewed and interpreted the cardiac monitored which showed an underlying rhythm of:    Reevaluation: After the interventions noted above, I reevaluated the patient and found that they have :stayed the same  Social Determinants of Health:  alcohol use disorder  Disposition: Admit to hospital  Co morbidities that complicate the patient evaluation  Past Medical History:  Diagnosis Date   Alcoholism (HCC)    Anxiety    Arthritis    rheumatoid   Cirrhosis (HCC)    Coronary artery calcification seen on CAT scan 04/08/2018   Dyspnea    HTN (hypertension) 04/08/2018   Hypercholesteremia    Hypertension    Interstitial lung disease (HCC)    Kidney stones 2020   Pulmonary fibrosis (HCC)    Rheumatoid arthritis (HCC)      Medicines Meds ordered this encounter  Medications   lactated ringers  bolus 1,000 mL   ondansetron  (ZOFRAN ) injection 4 mg   pantoprazole  (PROTONIX ) injection 80 mg   pantoprazole  (PROTONIX ) injection 80 mg   thiamine  (VITAMIN B1) 500 mg in sodium chloride  0.9 % 50 mL IVPB   OR Linked Order Group    acetaminophen  (TYLENOL ) tablet 650 mg    acetaminophen  (TYLENOL ) suppository 650 mg   morphine  (PF) 2 MG/ML injection 2 mg   hydrALAZINE  (APRESOLINE ) injection 5 mg   pantoprazole  (PROTONIX ) injection 40 mg   lactulose  (CHRONULAC ) 10 GM/15ML solution 20 g   DISCONTD: cefTRIAXone  (ROCEPHIN ) 2 g in sodium chloride  0.9 % 100 mL IVPB    Antibiotic Indication::   Other Indication (list below)    Other Indication::   Alcoholic  liver cirrhosis and GI bleed   cefTRIAXone  (ROCEPHIN ) 1 g in sodium chloride  0.9 % 100 mL IVPB    Antibiotic Indication::   Other Indication (list below)    Other Indication::   Alcoholic liver cirrhosis and GI bleed   OR Linked Order Group    thiamine  (VITAMIN B1) tablet 100 mg    thiamine  (VITAMIN B1) injection 100 mg   folic acid  (FOLVITE ) tablet 1 mg   multivitamin with minerals tablet 1 tablet   OR Linked Order Group    LORazepam  (ATIVAN ) tablet 1-4 mg     CIWA-AR < 5 =:   0 mg     CIWA-AR 5 -10 =:   1 mg     CIWA-AR 11 -15 =:   2 mg     CIWA-AR 16 -20 =:   3 mg     CIWA-AR 16 -20 =:   Recheck CIWA-AR in 1 hour; if > 20 notify MD     CIWA-AR > 20 =:   4 mg     CIWA-AR > 20 =:   Call Rapid Response    LORazepam  (ATIVAN ) injection 1-4 mg     CIWA-AR < 5 =:   0 mg     CIWA-AR 5 -10 =:   1 mg  CIWA-AR 11 -15 =:   2 mg     CIWA-AR 16 -20 =:   3 mg     CIWA-AR 16 -20 =:   Recheck CIWA-AR in 1 hour; if > 20 notify MD     CIWA-AR > 20 =:   4 mg     CIWA-AR > 20 =:   Call Rapid Response    I have reviewed the patients home medicines and have made adjustments as needed  Problem List / ED Course: Problem List Items Addressed This Visit       Digestive   Melena - Primary     Other   Anemia   Relevant Medications   folic acid  (FOLVITE ) tablet 1 mg   Other Visit Diagnoses       Hyperammonemia                    Final diagnoses:  Melena  Anemia, unspecified type  Hyperammonemia    ED Discharge Orders     None          Lenor Hollering, MD 02/14/24 2347  "

## 2024-02-14 NOTE — H&P (Signed)
 " History and Physical    [name] FMW:[id number] DOB: 1960-03-24 DOA: 02/14/2024 DOS: the patient was seen and examined on 02/14/2024 . PCP: [name], MD  Patient coming from: Home Chief complaint: Chief Complaint  Patient presents with   Black Tarry Stools   HPI:  Robert Greene is a 64 y.o. male with past medical history  of alcoholism, essential hypertension, hyperlipidemia, rheumatoid arthritis with history of Enbrel, liver cirrhosis and history of decompensated liver cirrhosis with ascites portal hypertensive gastropathy history of hepatic encephalopathy alcohol withdrawal, history of C. difficile colitis, pulmonary fibrosis, medical noncompliance, tobacco abuse, coming today for again brought by EMS from home with the last 3 days of feeling nauseated and weak and then this morning he noticed that he had several black tarry stools. Called son logan who did not know about pt being the hospital.  He had a procedure few months ago for tear in intestine. Pt has not had any transfusion in past per son.  Pt is still drinking  and last drink was approximately Tuesday. He spoke to him on Wednesday and he said he was not feeling good.  ED Course:  Vital signs in the ED were notable for the following:  Vitals:   02/14/24 1449 02/14/24 1457 02/14/24 1801  BP: 116/71  110/63  Pulse: 79  94  Temp: 98.1 F (36.7 C)  98.2 F (36.8 C)  Resp: 18  17  Height:  5' 9 (1.753 m)   Weight:  90.7 kg   SpO2: 100%  100%  TempSrc: Oral  Oral  BMI (Calculated):  29.52    >>ED evaluation thus far shows: CMP shows glucose 139 normal kidney function, abnormal lft with 67 AST, ALT 26 lipase 86 ammonia 306. CBC showing normal white count of 4.4 hemoglobin 8.5 previous hemoglobin of 11.2 MCV 71 RDW 19.3 and platelets of 57. Fecal occult is positive and stool was bloody. Alcohol level of 141. 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   >>While in the ED patient received the following: Medications  thiamine  (VITAMIN B1) 500 mg in sodium chloride  0.9 % 50 mL IVPB (500 mg Intravenous New Bag/Given 02/14/24 1854)  lactated ringers  bolus 1,000 mL (0 mLs Intravenous Stopped 02/14/24 1714)  ondansetron  (ZOFRAN ) injection 4 mg (4 mg Intravenous Given 02/14/24 1529)  pantoprazole  (PROTONIX ) injection 80 mg (80 mg Intravenous Given 02/14/24 1527)  pantoprazole  (PROTONIX ) injection 80 mg (80 mg Intravenous Given 02/14/24 1713)   Review of Systems  Reason unable to perform ROS: somnolent.   Past Medical History:  Diagnosis Date   Alcoholism (HCC)    Anxiety    Arthritis    rheumatoid   Cirrhosis (HCC)    Coronary artery calcification seen on CAT scan 04/08/2018   Dyspnea    HTN (hypertension) 04/08/2018   Hypercholesteremia    Hypertension    Interstitial lung disease (HCC)    Kidney stones 2020   Pulmonary fibrosis (HCC)    Rheumatoid arthritis (HCC)    Past Surgical History:  Procedure Laterality Date   COLONOSCOPY N/A 08/03/2023  Procedure: COLONOSCOPY;  Surgeon: Wilhelmenia Aloha Raddle., MD;  Location: [facility];  Service: Gastroenterology;  Laterality: N/A;   ESOPHAGOGASTRODUODENOSCOPY N/A 08/03/2023   Procedure: EGD (ESOPHAGOGASTRODUODENOSCOPY);  Surgeon: Wilhelmenia Aloha Raddle., MD;  Location: [facility];  Service: Gastroenterology;  Laterality: N/A;   ESOPHAGOGASTRODUODENOSCOPY N/A 09/16/2023   Procedure: EGD (ESOPHAGOGASTRODUODENOSCOPY);  Surgeon: Nandigam, Kavitha V,  MD;  Location: [facility];  Service: Gastroenterology;  Laterality: N/A;   ESOPHAGOGASTRODUODENOSCOPY N/A 11/28/2023   Procedure: EGD (ESOPHAGOGASTRODUODENOSCOPY);  Surgeon: Avram Lupita BRAVO, MD;  Location: [facility];  Service: Gastroenterology;  Laterality: N/A;   KNEE CARTILAGE SURGERY Left    x2   LUNG BIOPSY Right 12/06/2017   Procedure: LUNG BIOPSY;  Surgeon: Kerrin Elspeth BROCKS, MD;  Location: [facility];  Service: Thoracic;  Laterality: Right;   VIDEO ASSISTED THORACOSCOPY Right 12/06/2017   Procedure: VIDEO ASSISTED THORACOSCOPY;  Surgeon: Kerrin Elspeth BROCKS, MD;  Location: [facility] OR;  Service: Thoracic;  Laterality: Right;    reports that he has been smoking cigarettes. He has a 60 pack-year smoking history. He has never used smokeless tobacco. He reports that he does not currently use alcohol. He reports that he does not use drugs. Allergies[1] Family History  Problem Relation Age of Onset   Cirrhosis Father    Colon cancer Neg Hx    Stomach cancer Neg Hx    Esophageal cancer Neg Hx    Inflammatory bowel disease Neg Hx    Liver disease Neg Hx    Pancreatic cancer Neg Hx    Rectal cancer Neg Hx    Prior to Admission medications  Medication Sig Start Date End Date Taking? Authorizing Provider  carvedilol  (COREG ) 3.125 MG tablet Take 1 tablet (3.125 mg total) by mouth 2 (two) times daily with a meal. 09/17/23   Rosario Eland I, MD  EPINEPHrine  0.3 mg/0.3 mL IJ SOAJ injection Use once.  If ineffective, use 2nd dose. 11/19/16   Dean Clarity, MD  [Paused] etanercept (ENBREL) 50 MG/ML injection Inject 50 mg into the skin every Saturday. Patient not taking: Reported on 01/14/2024 Wait to take this until your doctor or other care provider tells you to start again.    [provider]  famotidine  (PEPCID ) 20 MG tablet Take 1 tablet (20 mg total) by mouth 2 (two) times daily. 01/16/24   Gonfa, Taye T, MD  folic acid  (FOLVITE ) 1 MG tablet Take 1 tablet (1 mg total) by mouth  daily. 01/16/24   Gonfa, Taye T, MD  furosemide  (LASIX ) 20 MG tablet Take 1 tablet (20 mg total) by mouth daily. 01/16/24   Gonfa, Taye T, MD  lactulose  (CHRONULAC ) 10 GM/15ML solution Take 30 mLs (20 g total) by mouth once (1) to three (3) times daily for a goal of 2-3 bowel movements a day. 01/16/24   Gonfa, Taye T, MD  Multiple Vitamin (MULTIVITAMIN WITH MINERALS) TABS tablet Take 1 tablet by mouth daily. 01/16/24   Gonfa, Taye T, MD  spironolactone  (ALDACTONE ) 50 MG tablet Take 1 tablet (50 mg total) by mouth daily. 01/16/24   Gonfa, Taye T, MD  Thiamine  HCl (B-1) 100 MG TABS Take 1 tablet (100 mg total) by mouth daily. 01/16/24   Gonfa, Taye T, MD  triamcinolone  cream (KENALOG) 0.1 % Apply 1 Application topically 2 (two) times daily. Patient not taking: Reported on 01/14/2024 12/07/23   [provider]  Vitals:   02/14/24 1449 02/14/24 1457 02/14/24 1801  BP: 116/71  110/63  Pulse: 79  94  Resp: 18  17  Temp: 98.1 F (36.7 C)  98.2 F (36.8 C)  TempSrc: Oral  Oral  SpO2: 100%  100%  Weight:  90.7 kg   Height:  5' 9 (1.753 m)    Physical Exam Vitals reviewed.  Constitutional:      General: He is not in acute distress.    Appearance: He is obese. He is not ill-appearing.     Comments: Somnolent.   HENT:     Head: Normocephalic.  Eyes:     Extraocular Movements: Extraocular movements intact.  Cardiovascular:     Rate and Rhythm: Normal rate and regular rhythm.     Pulses: Normal pulses.     Heart sounds: Normal heart sounds.  Pulmonary:     Effort: Pulmonary effort is normal.     Breath sounds: Normal breath sounds.  Abdominal:     General: There is no distension.     Palpations: Abdomen is soft.     Tenderness: There is no abdominal tenderness.  Musculoskeletal:     Right lower leg: Edema present.     Left lower leg: Edema present.  Neurological:     General: No focal deficit  present.     Mental Status: He is oriented to person, place, and time.  Psychiatric:        Behavior: Behavior is cooperative.     Labs on Admission: I have personally reviewed following labs and imaging studies CBC: Recent Labs  Lab 02/14/24 1514  WBC 4.4  HGB 8.5*  HCT 26.7*  MCV 71.0*  PLT 57*   Basic Metabolic Panel: Recent Labs  Lab 02/14/24 1514  NA 136  K 3.6  CL 100  CO2 23  GLUCOSE 139*  BUN 13  CREATININE 0.82  CALCIUM  8.9   GFR: Estimated Creatinine Clearance: 102.6 mL/min (by C-G formula based on SCr of 0.82 mg/dL). Liver Function Tests: Recent Labs  Lab 02/14/24 1514  AST 67*  ALT 26  ALKPHOS 132*  BILITOT 1.0  PROT 6.9  ALBUMIN  3.6   Recent Labs  Lab 02/14/24 1514  LIPASE 86*   Recent Labs  Lab 02/14/24 1514  AMMONIA 306*   Recent Labs    12/16/23 1313 12/31/23 2334 01/01/24 0819 01/02/24 0706 01/03/24 0452 01/04/24 0529 01/14/24 0401 01/15/24 0405 02/07/24 1326 02/14/24 1514  BUN 6* 8 9 7* 7* 8 9 10 9  9 13   CREATININE 0.80 0.81 0.79 0.74 0.61 0.73 0.76 0.68 0.88  1.20 0.82    Cardiac Enzymes: No results for input(s): CKTOTAL, CKMB, CKMBINDEX, TROPONINI in the last 168 hours. BNP (last 3 results) No results for input(s): PROBNP in the last 8760 hours. HbA1C: No results for input(s): HGBA1C in the last 72 hours. CBG: No results for input(s): GLUCAP in the last 168 hours. Lipid Profile: No results for input(s): CHOL, HDL, LDLCALC, TRIG, CHOLHDL, LDLDIRECT in the last 72 hours. Thyroid  Function Tests: No results for input(s): TSH, T4TOTAL, FREET4, T3FREE, THYROIDAB in the last 72 hours. Anemia Panel: No results for input(s): VITAMINB12, FOLATE, FERRITIN, TIBC, IRON, RETICCTPCT in the last 72 hours. Urine analysis:    Component Value Date/Time   COLORURINE YELLOW 11/09/2023 2107   APPEARANCEUR CLEAR 11/09/2023 2107   LABSPEC 1.003 (L) 11/09/2023 2107   PHURINE 7.0  11/09/2023 2107   GLUCOSEU NEGATIVE 11/09/2023 2107   HGBUR NEGATIVE 11/09/2023 2107  BILIRUBINUR NEGATIVE 11/09/2023 2107   BILIRUBINUR negative 09/16/2019 1114   KETONESUR NEGATIVE 11/09/2023 2107   PROTEINUR NEGATIVE 11/09/2023 2107   UROBILINOGEN 0.2 09/16/2019 1114   UROBILINOGEN 1.0 06/10/2009 1916   NITRITE NEGATIVE 11/09/2023 2107   LEUKOCYTESUR NEGATIVE 11/09/2023 2107   Radiological Exams on Admission: No results found. Data Reviewed: Relevant notes from primary care and specialist visits, past discharge summaries as available in EHR, including Care Everywhere . Prior diagnostic testing as pertinent to current admission diagnoses, Updated medications and problem lists for reconciliation .ED course, including vitals, labs, imaging, treatment and response to treatment,Triage notes, nursing and pharmacy notes and ED provider's notes.Notable results as noted in HPI.Discussed case with EDMD/ ED APP/ or Specialty MD on call and as needed.  Assessment & Plan  >>Melena / UGIB/ ABLA/Acute variceal hemorrhage: Patient presenting with black tarry stools.  Suspect variceal bleed as patient had gross blood on rectal exam per EDMD.  Will admit to progressive stepdown unit with continuous cardiac monitoring H&H every 4 hours x 3.  Continue patient on Protonix  IV every 12.  Will transfuse if patient's hemoglobin is 7 or below.  GI GI consult and management.  Patient is n.p.o. except ice chips and sips. Will consider octreotide  infusion if hemoglobin drops to 7 or below along with Blood product transfusion.  INR is within normal limits, thrombocytopenia is significant and will follow.    Latest Ref Rng & Units 02/14/2024    3:14 PM 02/07/2024    1:26 PM 01/15/2024    4:05 AM  CBC  WBC 4.0 - 10.5 K/uL 4.4  4.6  4.6   Hemoglobin 13.0 - 17.0 g/dL 8.5  9.9    88.7  8.3   Hematocrit 39.0 - 52.0 % 26.7  32.2    33.0  26.6   Platelets 150 - 400 K/uL 57  102  114   Type and screen, anemia panel,  trending hemoglobin.   >> Alcohol abuse: Stat thiamine , aspiration fall seizure precautions CIWA precautions. Counseling once stable.   >> Decompensated liver cirrhosis with hepatic encephalopathy: Secondary to patient's ongoing alcohol abuse.  Lactulose  ordered 3 times daily for the next 48 hours.  Neurochecks.  N.p.o. except for ice chips.  Rifaximin  once patient is able to take p.o.  >> Thrombocytopenia: Secondary to patient's portal hypertensive gastropathy with splenic sequestration , will consider transfusion if patients platelet count drops as a consequence of PRBC transfusion,  SCDs for DVT prophylaxis.  >> Essential hypertension: Vitals:   02/14/24 1449 02/14/24 1801  BP: 116/71 110/63  Hold PTA meds which includes Coreg  will consider propranolol upon discharge.  DVT prophylaxis:  SCDs Consults:  Gastroenterology  Advance Care Planning:    Code Status: Full Code   Family Communication:  Son Paediatric Nurse Disposition Plan:  To be determined Severity of Illness: The appropriate patient status for this patient is INPATIENT. Inpatient status is judged to be reasonable and necessary in order to provide the required intensity of service to ensure the patient's safety. The patient's presenting symptoms, physical exam findings, and initial radiographic and laboratory data in the context of their chronic comorbidities is felt to place them at high risk for further clinical deterioration. Furthermore, it is not anticipated that the patient will be medically stable for discharge from the hospital within 2 midnights of admission.   * I certify that at the point of admission it is my clinical judgment that the patient will require inpatient hospital care spanning beyond 2 midnights from  the point of admission due to high intensity of service, high risk for further deterioration and high frequency of surveillance required.*  Unresulted Labs (From admission, onward)     Start     Ordered    02/15/24 0500  Comprehensive metabolic panel  Tomorrow morning,   R        02/14/24 1906   02/15/24 0500  CBC  Tomorrow morning,   R        02/14/24 1906   02/15/24 0500  Magnesium   Tomorrow morning,   R        02/14/24 1906   02/14/24 1909  Vitamin B12  (Anemia Panel (PNL))  Once,   R        02/14/24 1908   02/14/24 1909  Folate  (Anemia Panel (PNL))  Once,   R        02/14/24 1908   02/14/24 1909  Iron and TIBC  (Anemia Panel (PNL))  Once,   R        02/14/24 1908   02/14/24 1909  Ferritin  (Anemia Panel (PNL))  Once,   R        02/14/24 1908   02/14/24 1909  Reticulocytes  (Anemia Panel (PNL))  Once,   R        02/14/24 1908   02/14/24 1909  TSH  Add-on,   AD        02/14/24 1908   02/14/24 1909  T4, free  Add-on,   AD        02/14/24 1908   02/14/24 1905  Hemoglobin  Now then every 4 hours,   R      02/14/24 1906   02/14/24 1905  Hematocrit  Now then every 4 hours,   R      02/14/24 1906   02/14/24 1905  Magnesium   Add-on,   AD        02/14/24 1906   02/14/24 1459  Urine Drug Screen  Once,   URGENT        02/14/24 1458   02/14/24 1459  Urinalysis, Routine w reflex microscopic -Urine, Clean Catch  ONCE - STAT,   URGENT       Question:  Specimen Source  Answer:  Urine, Clean Catch   02/14/24 1458            Meds ordered this encounter  Medications   lactated ringers  bolus 1,000 mL   ondansetron  (ZOFRAN ) injection 4 mg   pantoprazole  (PROTONIX ) injection 80 mg   pantoprazole  (PROTONIX ) injection 80 mg   thiamine  (VITAMIN B1) 500 mg in sodium chloride  0.9 % 50 mL IVPB   OR Linked Order Group    acetaminophen  (TYLENOL ) tablet 650 mg    acetaminophen  (TYLENOL ) suppository 650 mg   morphine  (PF) 2 MG/ML injection 2 mg   hydrALAZINE  (APRESOLINE ) injection 5 mg   pantoprazole  (PROTONIX ) injection 40 mg   lactulose  (CHRONULAC ) 10 GM/15ML solution 20 g   DISCONTD: cefTRIAXone  (ROCEPHIN ) 2 g in sodium chloride  0.9 % 100 mL IVPB    Antibiotic Indication::   Other  Indication (list below)    Other Indication::   Alcoholic liver cirrhosis and GI bleed   cefTRIAXone  (ROCEPHIN ) 1 g in sodium chloride  0.9 % 100 mL IVPB    Antibiotic Indication::   Other Indication (list below)    Other Indication::   Alcoholic liver cirrhosis and GI bleed   OR Linked Order Group    thiamine  (VITAMIN B1) tablet 100 mg  thiamine  (VITAMIN B1) injection 100 mg   folic acid  (FOLVITE ) tablet 1 mg   multivitamin with minerals tablet 1 tablet   OR Linked Order Group    LORazepam  (ATIVAN ) tablet 1-4 mg     CIWA-AR < 5 =:   0 mg     CIWA-AR 5 -10 =:   1 mg     CIWA-AR 11 -15 =:   2 mg     CIWA-AR 16 -20 =:   3 mg     CIWA-AR 16 -20 =:   Recheck CIWA-AR in 1 hour; if > 20 notify MD     CIWA-AR > 20 =:   4 mg     CIWA-AR > 20 =:   Call Rapid Response    LORazepam  (ATIVAN ) injection 1-4 mg     CIWA-AR < 5 =:   0 mg     CIWA-AR 5 -10 =:   1 mg     CIWA-AR 11 -15 =:   2 mg     CIWA-AR 16 -20 =:   3 mg     CIWA-AR 16 -20 =:   Recheck CIWA-AR in 1 hour; if > 20 notify MD     CIWA-AR > 20 =:   4 mg     CIWA-AR > 20 =:   Call Rapid Response     Orders Placed This Encounter  Procedures   Comprehensive metabolic panel with GFR   CBC   Lipase, blood   Ethanol   Urine Drug Screen   Urinalysis, Routine w reflex microscopic -Urine, Clean Catch   Protime-INR   Ammonia   Hemoglobin   Hematocrit   Comprehensive metabolic panel   CBC   Magnesium    Magnesium    Vitamin B12   Folate   Iron and TIBC   Ferritin   Reticulocytes   TSH   T4, free   Diet NPO time specified Except for: Ice Chips, Sips with Meds   SCDs   Vital signs   Notify physician (specify)   Mobility Protocol: No Restrictions   Refer to Sidebar Report Mobility Protocol for Adult Inpatient   Initiate Adult Central Line Maintenance and Catheter Clearance Protocol for patients with central line (CVC, PICC, Port, Hemodialysis, Trialysis)   If patient diabetic or glucose greater than 140 notify physician for  Sliding Scale Insulin Orders   Intake and Output   Initiate CHG Protocol for patients in ICU/SD or any patient with a central line or foley catheter   Do not place and if present remove PureWick   Initiate Oral Care Protocol   Initiate Carrier Fluid Protocol   RN may order General Admission PRN Orders utilizing General Admission PRN medications (through manage orders) for the following patient needs: allergy symptoms (Claritin), cold sores (Carmex), cough (Robitussin DM), eye irritation (Liquifilm Tears), hemorrhoids (Tucks), indigestion (Maalox), minor skin irritation (Hydrocortisone Cream), muscle pain Lucienne Gay), nose irritation (saline nasal spray) and sore throat (Chloraseptic spray).   Cardiac Monitoring - Continuous Indefinite   Ambulate with assistance   Neuro checks   Clinical Institute Withdrawal Assessent (CIWA)   Notify Pharmacy to change IV Ativan  to PO if tolerating POs well.   If Ativan  given, reassess Clinical Institute Withdrawal Assessment (CIWA) with blood pressure and pulse rate within 1 hour of Ativan  administration   Refer to Sidebar Report to reference: ETOH Withdrawal Guidelines   Cardiac monitoring   Notify physician (specify)   Full code   Consult to hospitalist   Consult  to gastroenterology   Consult to Transition of Care Team   Pulse oximetry check with vital signs   Oxygen therapy Mode or (Route): Nasal cannula; Liters Per Minute: 2; Keep O2 saturation between: greater than 92 %   Pulse oximetry, continuous   POC occult blood, ED Provider will collect   Type and screen   Admit to Inpatient (patient's expected length of stay will be greater than 2 midnights or inpatient only procedure)   Aspiration precautions   Fall precautions   Seizure precautions    Author: Mario LULLA Blanch, MD 12 pm- 8 pm. [facility]. 02/14/2024 7:52 PM Please note for any communication after hours contact [facility] Assigned provider on call on Amion.     [1]  Allergies Allergen  Reactions   Bee Venom Anaphylaxis, Hives and Rash   Spider Antivenin [S Black Widow (Latrodec Mactans) Antivenin] Anaphylaxis, Hives and Rash   "

## 2024-02-14 NOTE — TOC CM/SW Note (Signed)
 TOC consult received for substance abuse. Follow-up to be completed with patient as appropriate.    Merilee Batty, MSN, RN Case Management [phone]

## 2024-02-14 NOTE — ED Notes (Signed)
 Based on CIWA score of 7, pt is due 1mg  ativan . Pt too lethargic to administer Ativan . Provider @ bedside made aware. Ok to hold ativan  for 1 hr and reassess.

## 2024-02-14 NOTE — ED Provider Triage Note (Signed)
 Emergency Medicine Provider Triage Evaluation Note  Robert Greene , a 64 y.o. male  was evaluated in triage.  Pt w hx etoh use disorder, c/o nausea/dry heaves, general weakness, and noting black/tarry stools in past 1-2 days. No fevers. No constant and/or focal abd pain.   Review of Systems  Positive: Dark stooks, weakness.  Negative: Chest pain, sob.   Physical Exam  BP 116/71 (BP Location: Left Arm)   Pulse 79   Temp 98.1 F (36.7 C) (Oral)   Resp 18   Ht 1.753 m (5' 9)   Wt 90.7 kg   SpO2 100%   BMI 29.53 kg/m  Gen:   Awake, no acute distress.  Resp:  Normal effort, breathing comfortably.  Abd:   Soft, mild epigastric tenderness.    Medical Decision Making  Medically screening exam initiated at 3:04 PM.  Appropriate orders placed.  JAVANNI MARING was informed that the remainder of the evaluation will be completed by another provider, this initial triage assessment does not replace that evaluation, and the importance of remaining in the ED until their evaluation is complete.  Labs ordered. Ivf and meds ordered.    Bernard Drivers, MD 02/14/24 1505

## 2024-02-14 NOTE — ED Triage Notes (Signed)
 Pt BIB [facility] from home d/t the last 3 days feeling nauseous & weak, then this AM has had several black tarry stools since this morning. VSS @ 110/68, 90 bpm, 98% on RA, CBG 172.

## 2024-02-14 NOTE — Significant Event (Signed)
 Patient's hemoglobin dropped to 6.7.  Patient had some nausea but no obvious vomiting or rectal bleeding since admission.  On exam at bedside patient is following commands.  Patient did consent for blood transfusion.  I ordered 2 units PRBC.  Redia Cleaver MD.

## 2024-02-15 ENCOUNTER — Encounter (HOSPITAL_COMMUNITY): Admission: EM | Payer: Self-pay | Source: Home / Self Care | Attending: Internal Medicine

## 2024-02-15 ENCOUNTER — Inpatient Hospital Stay (HOSPITAL_COMMUNITY): Admitting: Anesthesiology

## 2024-02-15 DIAGNOSIS — F1090 Alcohol use, unspecified, uncomplicated: Secondary | ICD-10-CM

## 2024-02-15 DIAGNOSIS — K766 Portal hypertension: Secondary | ICD-10-CM

## 2024-02-15 DIAGNOSIS — K3189 Other diseases of stomach and duodenum: Secondary | ICD-10-CM

## 2024-02-15 DIAGNOSIS — I10 Essential (primary) hypertension: Secondary | ICD-10-CM

## 2024-02-15 DIAGNOSIS — I8500 Esophageal varices without bleeding: Secondary | ICD-10-CM

## 2024-02-15 DIAGNOSIS — D649 Anemia, unspecified: Secondary | ICD-10-CM

## 2024-02-15 DIAGNOSIS — K922 Gastrointestinal hemorrhage, unspecified: Secondary | ICD-10-CM | POA: Diagnosis not present

## 2024-02-15 DIAGNOSIS — K920 Hematemesis: Secondary | ICD-10-CM | POA: Diagnosis not present

## 2024-02-15 DIAGNOSIS — I2510 Atherosclerotic heart disease of native coronary artery without angina pectoris: Secondary | ICD-10-CM | POA: Diagnosis not present

## 2024-02-15 DIAGNOSIS — K921 Melena: Secondary | ICD-10-CM | POA: Diagnosis not present

## 2024-02-15 DIAGNOSIS — K7030 Alcoholic cirrhosis of liver without ascites: Secondary | ICD-10-CM | POA: Diagnosis not present

## 2024-02-15 DIAGNOSIS — F419 Anxiety disorder, unspecified: Secondary | ICD-10-CM | POA: Diagnosis not present

## 2024-02-15 LAB — COMPREHENSIVE METABOLIC PANEL WITH GFR
ALT: 26 U/L (ref 0–44)
AST: 65 U/L — ABNORMAL HIGH (ref 15–41)
Albumin: 3.5 g/dL (ref 3.5–5.0)
Alkaline Phosphatase: 118 U/L (ref 38–126)
Anion gap: 12 (ref 5–15)
BUN: 13 mg/dL (ref 8–23)
CO2: 23 mmol/L (ref 22–32)
Calcium: 9 mg/dL (ref 8.9–10.3)
Chloride: 104 mmol/L (ref 98–111)
Creatinine, Ser: 0.78 mg/dL (ref 0.61–1.24)
GFR, Estimated: >60 mL/min
Glucose, Bld: 135 mg/dL — ABNORMAL HIGH (ref 70–99)
Potassium: 3.5 mmol/L (ref 3.5–5.1)
Sodium: 139 mmol/L (ref 135–145)
Total Bilirubin: 2.3 mg/dL — ABNORMAL HIGH (ref 0.0–1.2)
Total Protein: 6.7 g/dL (ref 6.5–8.1)

## 2024-02-15 LAB — CBC
HCT: 29.3 % — ABNORMAL LOW (ref 39.0–52.0)
Hemoglobin: 9.7 g/dL — ABNORMAL LOW (ref 13.0–17.0)
MCH: 23.8 pg — ABNORMAL LOW (ref 26.0–34.0)
MCHC: 33.1 g/dL (ref 30.0–36.0)
MCV: 72 fL — ABNORMAL LOW (ref 80.0–100.0)
Platelets: 45 K/uL — ABNORMAL LOW (ref 150–400)
RBC: 4.07 MIL/uL — ABNORMAL LOW (ref 4.22–5.81)
RDW: 19.6 % — ABNORMAL HIGH (ref 11.5–15.5)
WBC: 4.4 K/uL (ref 4.0–10.5)
nRBC: 0 % (ref 0.0–0.2)

## 2024-02-15 LAB — PREPARE RBC (CROSSMATCH)

## 2024-02-15 LAB — MAGNESIUM: Magnesium: 1.8 mg/dL (ref 1.7–2.4)

## 2024-02-15 LAB — MRSA NEXT GEN BY PCR, NASAL: MRSA by PCR Next Gen: NOT DETECTED

## 2024-02-15 MED ORDER — DEXAMETHASONE SOD PHOSPHATE PF 10 MG/ML IJ SOLN
INTRAMUSCULAR | Status: DC | PRN
  Administered 2024-02-15: 10 mg via INTRAVENOUS

## 2024-02-15 MED ORDER — LABETALOL HCL 5 MG/ML IV SOLN
INTRAVENOUS | Status: DC | PRN
  Administered 2024-02-15 (×2): 5 mg via INTRAVENOUS

## 2024-02-15 MED ORDER — SODIUM CHLORIDE 0.9 % IV SOLN: INTRAVENOUS | Status: DC

## 2024-02-15 MED ORDER — KETOROLAC TROMETHAMINE 30 MG/ML IJ SOLN: 30.0000 mg | Freq: Once | INTRAMUSCULAR | Status: DC

## 2024-02-15 MED ORDER — SUCCINYLCHOLINE CHLORIDE 200 MG/10ML IV SOSY
PREFILLED_SYRINGE | INTRAVENOUS | Status: DC | PRN
  Administered 2024-02-15: 120 mg via INTRAVENOUS

## 2024-02-15 MED ORDER — PROPOFOL 10 MG/ML IV BOLUS
INTRAVENOUS | Status: DC | PRN
  Administered 2024-02-15: 20 mg via INTRAVENOUS
  Administered 2024-02-15: 30 mg via INTRAVENOUS
  Administered 2024-02-15: 20 mg via INTRAVENOUS
  Administered 2024-02-15: 50 mg via INTRAVENOUS
  Administered 2024-02-15: 40 mg via INTRAVENOUS
  Administered 2024-02-15: 30 mg via INTRAVENOUS

## 2024-02-15 MED ORDER — ONDANSETRON HCL 4 MG/2ML IJ SOLN
INTRAMUSCULAR | Status: DC | PRN
  Administered 2024-02-15: 4 mg via INTRAVENOUS

## 2024-02-15 MED ORDER — LACTULOSE 10 GM/15ML PO SOLN
20.0000 g | Freq: Two times a day (BID) | ORAL | Status: DC | PRN
  Filled 2024-02-15: qty 30

## 2024-02-15 MED ORDER — LIDOCAINE 2% (20 MG/ML) 5 ML SYRINGE
INTRAMUSCULAR | Status: DC | PRN
  Administered 2024-02-15: 100 mg via INTRAVENOUS

## 2024-02-15 MED ORDER — PROPOFOL 500 MG/50ML IV EMUL
INTRAVENOUS | Status: DC | PRN
  Administered 2024-02-15: 50 ug/kg/min via INTRAVENOUS

## 2024-02-15 MED ORDER — PROCHLORPERAZINE EDISYLATE 10 MG/2ML IJ SOLN
5.0000 mg | Freq: Once | INTRAMUSCULAR | Status: AC
  Administered 2024-02-15: 5 mg via INTRAVENOUS
  Filled 2024-02-15: qty 2

## 2024-02-15 MED ORDER — LACTULOSE ENEMA
300.0000 mL | Freq: Two times a day (BID) | ORAL | Status: DC
  Administered 2024-02-15: 300 mL via RECTAL
  Filled 2024-02-15 (×3): qty 300

## 2024-02-15 NOTE — ED Notes (Signed)
 Pt with 2 large bloody Bowel movements with large clots within 30 minutes of each other. Provider paged for notification

## 2024-02-15 NOTE — Consult Note (Signed)
 "                                               Consultation Note   Referring Provider:   [facility] PCP: [name], MD Primary Gastroenterologist:   Aloha Finner, MD   Reason for Consultation: Cirrhosis, upper GI bleed DOA: 02/14/2024         Hospital Day: 2   Assessment::    # 64 year old male with decompensated alcohol related cirrhosis with history of esophageal variceal bleeding status post banding.  Presents now with recurrent upper GI bleeding with coffee-ground emesis /melena and and ongoing alcohol use.  Dmani takes a beta-blocker and only grade 1 esophageal varices were found on most recent bleeding EGD 11/28/2023  (in addition to portal hypertensive gastropathy ) but still consider variceal bleed in the setting of ongoing alcohol use.] MELD 3.0: 11 at 02/15/2024  6:19 AM MELD-Na: 12 at 02/15/2024  6:19 AM  # Chronic anemia with iron  deficiency.  Presenting hemoglobin 8.5.  Down from 11.2 a couple of weeks ago (difficult to know what is true baseline hemoglobin is as most labs in epic are during hospitalizations when he has been acutely ill /  having GI bleeds).   # C. difficile infection Admitted December 2025 with diarrhea /C. Difficile.   # Diminutive distal colon polyp Found at time of colonoscopy July 2025.  Plan was for repeat colonoscopy with polypectomy when patient was stable  # See PMH for any additional medical history  / medical problems   Plan: -- Continue twice daily IV pantoprazole  -- Getting IV and p.o. thiamine  -- Getting empiric Rocephin  -- Patient unable to take p.o. right now he is quite obtunded from IV Ativan .  Once the effects of Ativan  wear off will start p.o. lactulose .  -- EGD today.  Depending on results will decide if octreotide  is needed.  Patient's RN Darlyn and I called patient's son [name] to get consent for the EGD. The risks and benefits of EGD with possible biopsies were discussed with [name] .  He gives consent for us  to proceed      HPI:   Brief History:  Robert Greene is a 64 year old male with a history of alcohol related decompensated cirrhosis.  He has a history of esophageal variceal bleeding status post banding.  In November he had an upper GI bleed but EGD showed only small esophageal varices felt not to be the source of bleeding.  Portal hypertension was the most likely source.  In December he was hospitalized with diarrhea, stool studies were positive for C. difficile..   Interval History:  [name] was brought in to the ED yesterday by EMS with complaints of nausea vomiting and black stool.  [name] has received IV Ativan  and is not able to give any history.  He is nearly obtunded.  I spoke with his son [name] over the phone and patient is actively consuming alcohol.  Son was not aware that patient was having an acute GI bleed.   Workup notable for:  In the ED patient has had transient mild tachycardia, BP stable, afebrile hemoglobin 8.5, down from 11.2 on 02/07/2024, WBC 4.4, platelets 57, alk phos 132, AST 67, ALT 26, ammonia 306, ferritin 24, TIBC 413 with 6% iron  saturation, INR 1.2   Recent Labs    02/14/24 1514 02/15/24 0619  PROT 6.9 6.7  ALBUMIN  3.6 3.5  AST 67* 65*  ALT 26 26  ALKPHOS 132* 118  BILITOT 1.0 2.3*   Recent Labs    02/14/24 1514 02/14/24 2018 02/14/24 2330 02/15/24 0619  WBC 4.4  --   --  4.4  HGB 8.5* 7.4* 6.7* 9.7*  HCT 26.7* 23.7* 21.0* 29.3*  MCV 71.0*  --   --  72.0*  PLT 57*  --   --  45*   Recent Labs    02/14/24 1514 02/15/24 0619  NA 136 139  K 3.6 3.5  CL 100 104  CO2 23 23  GLUCOSE 139* 135*  BUN 13 13  CREATININE 0.82 0.78  CALCIUM  8.9 9.0      Review of Systems: Unable to obtain secondary to altered mental status  Physical Exam: Vital signs in last 24 hours: Temp:  [97.9 F (36.6 C)-98.9 F (37.2 C)] 98.5 F (36.9 C) (01/23 0841) Pulse Rate:  [72-111] 107 (01/23 0830) Resp:  [12-24] 17 (01/23 0830) BP: (92-160)/(37-93) 131/78 (01/23 0830) SpO2:   [98 %-100 %] 100 % (01/23 0830) Weight:  [90.7 kg] 90.7 kg (01/22 1457)   General: White male lying on his side , difficult to wake up Eyes: Pupils equal, sluggish reaction Ears:  Normal auditory acuity Nose: No deformity, discharge or lesions Neck:  Supple, no masses felt Lungs:  Clear to auscultation.  Heart:  Regular rate, regular rhythm.  Abdomen:  Soft, nondistended, doesn't appear to be tender, active bowel sounds, no masses felt Rectal :  Deferred Msk: Symmetrical without gross deformities.  Neurologic:  difficult to arouse  Extremities : No edema Skin:  Intact without significant lesions.   OUTPATIENT MEDICATIONS Prior to Admission medications  Medication Sig Start Date End Date Taking? Authorizing Provider  carvedilol  (COREG ) 3.125 MG tablet Take 1 tablet (3.125 mg total) by mouth 2 (two) times daily with a meal. 09/17/23  Yes Rosario Leatrice FERNS, MD  diphenhydrAMINE  (BENADRYL  ALLERGY) 25 MG tablet Take 25 mg by mouth every 6 (six) hours as needed for itching.   Yes [provider]  EPINEPHrine  0.3 mg/0.3 mL IJ SOAJ injection Use once.  If ineffective, use 2nd dose. 11/19/16  Yes Dean Clarity, MD  famotidine  (PEPCID ) 20 MG tablet Take 1 tablet (20 mg total) by mouth 2 (two) times daily. 01/16/24  Yes Gonfa, Taye T, MD  folic acid  (FOLVITE ) 1 MG tablet Take 1 tablet (1 mg total) by mouth daily. 01/16/24  Yes Gonfa, Taye T, MD  furosemide  (LASIX ) 20 MG tablet Take 1 tablet (20 mg total) by mouth daily. 01/16/24  Yes Gonfa, Taye T, MD  hydrocortisone cream 1 % Apply 1 Application topically 2 (two) times daily as needed for itching (if out of triamcinolone  cream).   Yes [provider]  lactulose  (CHRONULAC ) 10 GM/15ML solution Take 30 mLs (20 g total) by mouth once (1) to three (3) times daily for a goal of 2-3 bowel movements a day. 01/16/24  Yes Gonfa, Taye T, MD  spironolactone  (ALDACTONE ) 50 MG tablet Take 1 tablet (50 mg total) by mouth daily. 01/16/24   Yes Kathrin Mignon DASEN, MD  Thiamine  HCl (B-1) 100 MG TABS Take 1 tablet (100 mg total) by mouth daily. 01/16/24  Yes Gonfa, Taye T, MD  triamcinolone  cream (KENALOG) 0.1 % Apply 1 Application topically 2 (two) times daily. 12/07/23  Yes [provider]  [Paused] etanercept (ENBREL) 50 MG/ML injection Inject 50 mg into the skin every Saturday. Patient not taking: Reported on  01/14/2024 Wait to take this until your doctor or other care provider tells you to start again.    [provider]    Allergies as of 02/14/2024 - Review Complete 02/14/2024  Allergen Reaction Noted   Bee venom Anaphylaxis, Hives, and Rash 07/25/2013   Spider antivenin [s black widow (latrodec mactans) antivenin] Anaphylaxis, Hives, and Rash 05/12/2013    INPATIENT MEDICATIONS Current Facility-Administered Medications  Medication Dose Route Frequency Provider Last Rate Last Admin   acetaminophen  (TYLENOL ) tablet 650 mg  650 mg Oral Q6H PRN Patel, Ekta V, MD       Or   acetaminophen  (TYLENOL ) suppository 650 mg  650 mg Rectal Q6H PRN Patel, Ekta V, MD       cefTRIAXone  (ROCEPHIN ) 1 g in sodium chloride  0.9 % 100 mL IVPB  1 g Intravenous Q24H Patel, Ekta V, MD   Stopped at 02/14/24 2139   folic acid  (FOLVITE ) tablet 1 mg  1 mg Oral Daily Tobie Mario GAILS, MD   1 mg at 02/14/24 2102   hydrALAZINE  (APRESOLINE ) injection 5 mg  5 mg Intravenous Q4H PRN Tobie Mario GAILS, MD       lactulose  (CHRONULAC ) 10 GM/15ML solution 20 g  20 g Oral TID Patel, Ekta V, MD   20 g at 02/14/24 2105   LORazepam  (ATIVAN ) tablet 1-4 mg  1-4 mg Oral Q1H PRN Patel, Ekta V, MD       Or   LORazepam  (ATIVAN ) injection 1-4 mg  1-4 mg Intravenous Q1H PRN Patel, Ekta V, MD   2 mg at 02/15/24 0319   morphine  (PF) 2 MG/ML injection 2 mg  2 mg Intravenous Q2H PRN Patel, Ekta V, MD       multivitamin with minerals tablet 1 tablet  1 tablet Oral Daily Tobie Mario GAILS, MD   1 tablet at 02/14/24 2102   pantoprazole  (PROTONIX ) injection 40 mg  40 mg  Intravenous Q12H Tobie Mario GAILS, MD       thiamine  (VITAMIN B1) tablet 100 mg  100 mg Oral Daily Tobie Mario GAILS, MD   100 mg at 02/14/24 2101   Or   thiamine  (VITAMIN B1) injection 100 mg  100 mg Intravenous Daily Tobie Mario GAILS, MD       Current Outpatient Medications  Medication Sig Dispense Refill   carvedilol  (COREG ) 3.125 MG tablet Take 1 tablet (3.125 mg total) by mouth 2 (two) times daily with a meal. 60 tablet 1   diphenhydrAMINE  (BENADRYL  ALLERGY) 25 MG tablet Take 25 mg by mouth every 6 (six) hours as needed for itching.     EPINEPHrine  0.3 mg/0.3 mL IJ SOAJ injection Use once.  If ineffective, use 2nd dose. 2 Device 0   famotidine  (PEPCID ) 20 MG tablet Take 1 tablet (20 mg total) by mouth 2 (two) times daily. 180 tablet 0   folic acid  (FOLVITE ) 1 MG tablet Take 1 tablet (1 mg total) by mouth daily. 30 tablet 1   furosemide  (LASIX ) 20 MG tablet Take 1 tablet (20 mg total) by mouth daily. 90 tablet 0   hydrocortisone cream 1 % Apply 1 Application topically 2 (two) times daily as needed for itching (if out of triamcinolone  cream).     lactulose  (CHRONULAC ) 10 GM/15ML solution Take 30 mLs (20 g total) by mouth once (1) to three (3) times daily for a goal of 2-3 bowel movements a day. 946 mL 1   spironolactone  (ALDACTONE ) 50 MG tablet Take 1 tablet (50 mg total) by  mouth daily. 90 tablet 0   Thiamine  HCl (B-1) 100 MG TABS Take 1 tablet (100 mg total) by mouth daily. 30 tablet 0   triamcinolone  cream (KENALOG) 0.1 % Apply 1 Application topically 2 (two) times daily.     [Paused] etanercept (ENBREL) 50 MG/ML injection Inject 50 mg into the skin every Saturday. (Patient not taking: Reported on 01/14/2024)       Past Medical History:  Diagnosis Date   Alcoholism (HCC)    Anxiety    Arthritis    rheumatoid   Cirrhosis (HCC)    Coronary artery calcification seen on CAT scan 04/08/2018   Dyspnea    HTN (hypertension) 04/08/2018   Hypercholesteremia    Hypertension    Interstitial lung  disease (HCC)    Kidney stones 2020   Pulmonary fibrosis (HCC)    Rheumatoid arthritis (HCC)     Past Surgical History:  Procedure Laterality Date   COLONOSCOPY N/A 08/03/2023   Procedure: COLONOSCOPY;  Surgeon: Wilhelmenia Aloha Raddle., MD;  Location: [facility];  Service: Gastroenterology;  Laterality: N/A;   ESOPHAGOGASTRODUODENOSCOPY N/A 08/03/2023   Procedure: EGD (ESOPHAGOGASTRODUODENOSCOPY);  Surgeon: Wilhelmenia Aloha Raddle., MD;  Location: [facility];  Service: Gastroenterology;  Laterality: N/A;   ESOPHAGOGASTRODUODENOSCOPY N/A 09/16/2023   Procedure: EGD (ESOPHAGOGASTRODUODENOSCOPY);  Surgeon: Nandigam, Kavitha V, MD;  Location: [facility];  Service: Gastroenterology;  Laterality: N/A;   ESOPHAGOGASTRODUODENOSCOPY N/A 11/28/2023   Procedure: EGD (ESOPHAGOGASTRODUODENOSCOPY);  Surgeon: Avram Lupita BRAVO, MD;  Location: [facility];  Service: Gastroenterology;  Laterality: N/A;   KNEE CARTILAGE SURGERY Left    x2   LUNG BIOPSY Right 12/06/2017   Procedure: LUNG BIOPSY;  Surgeon: Kerrin Elspeth BROCKS, MD;  Location: [facility] OR;  Service: Thoracic;  Laterality: Right;   VIDEO ASSISTED THORACOSCOPY Right 12/06/2017   Procedure: VIDEO ASSISTED THORACOSCOPY;  Surgeon: Kerrin Elspeth BROCKS, MD;  Location: [facility] OR;  Service: Thoracic;  Laterality: Right;    Family History  Problem Relation Age of Onset   Cirrhosis Father    Colon cancer Neg Hx    Stomach cancer Neg Hx    Esophageal cancer Neg Hx    Inflammatory bowel disease Neg Hx    Liver disease Neg Hx    Pancreatic cancer Neg Hx    Rectal cancer Neg Hx     Social History   Socioeconomic History   Marital status: Widowed    Spouse name: Not on file   Number of children: 2   Years of education: Not on file   Highest education level: Not on file  Occupational History   Not on file  Tobacco Use   Smoking status: Every Day    Current packs/day: 1.50    Average packs/day: 1.5 packs/day for 40.0 years (60.0 ttl pk-yrs)     Types: Cigarettes   Smokeless tobacco: Never   Tobacco comments:    Pt states he does smoke every now and then but not much. 02/14/22 ALS   Vaping Use   Vaping status: Every Day  Substance and Sexual Activity   Alcohol use: Not Currently   Drug use: No   Sexual activity: Yes    Birth control/protection: None  Other Topics Concern   Not on file  Social History Narrative   Not on file   Social Drivers of Health   Tobacco Use: High Risk (02/14/2024)   Patient History    Smoking Tobacco Use: Every Day    Smokeless Tobacco Use: Never    Passive Exposure: Not on  file  Financial Resource Strain: Not on file  Food Insecurity: Food Insecurity Present (01/14/2024)   Epic    Worried About Programme Researcher, Broadcasting/film/video in the Last Year: Sometimes true    Ran Out of Food in the Last Year: Sometimes true  Transportation Needs: No Transportation Needs (01/14/2024)   Epic    Lack of Transportation (Medical): No    Lack of Transportation (Non-Medical): No  Physical Activity: Not on file  Stress: Not on file  Social Connections: Not on file  Intimate Partner Violence: Not At Risk (01/14/2024)   Epic    Fear of Current or Ex-Partner: No    Emotionally Abused: No    Physically Abused: No    Sexually Abused: No  Depression (PHQ2-9): Not on file  Alcohol Screen: Not on file  Housing: Low Risk (01/14/2024)   Epic    Unable to Pay for Housing in the Last Year: No    Number of Times Moved in the Last Year: 0    Homeless in the Last Year: No  Utilities: Not At Risk (01/14/2024)   Epic    Threatened with loss of utilities: No  Health Literacy: Not on file    Code Status   Code Status: Full Code   Vina Dasen, NP-C   02/15/2024, 8:54 AM     "

## 2024-02-15 NOTE — Progress Notes (Signed)
 Procedure paused to change from mac to general anesthesia

## 2024-02-15 NOTE — Transfer of Care (Signed)
 Immediate Anesthesia Transfer of Care Note  [name]  Procedure(s) Performed: EGD (ESOPHAGOGASTRODUODENOSCOPY) ESOPHAGOSCOPY, WITH ESOPHAGEAL VARICES BAND LIGATION  Patient Location: Endoscopy Unit  Anesthesia Type:General  Level of Consciousness: drowsy  Airway & Oxygen Therapy: Patient Spontanous Breathing and Patient connected to face mask oxygen  Post-op Assessment: Report given to RN, Post -op Vital signs reviewed and stable, Patient moving all extremities, and Patient moving all extremities X 4  Post vital signs: Reviewed and stable  Last Vitals:  Vitals Value Taken Time  BP 179/103 02/15/24 12:39  Temp 36.7 C 02/15/24 12:39  Pulse 123 02/15/24 12:40  Resp 20 02/15/24 12:40  SpO2 99 % 02/15/24 12:40  Vitals shown include unfiled device data.  Last Pain:  Vitals:   02/15/24 1239  TempSrc: (P) Temporal  PainSc:          Complications: No notable events documented.

## 2024-02-15 NOTE — ED Notes (Signed)
 Provider [place] returned page at [phone]. Provider advised to wait to see what the repeat CBC is and that he would contact GI.

## 2024-02-15 NOTE — Progress Notes (Addendum)
 " Progress Note   [name] FMW:[id number] DOB: 04-16-1960 DOA: 02/14/2024     1 DOS: the patient was seen and examined on 02/15/2024   Brief hospital course: From HPI Robert Greene is a 64 y.o. male with past medical history  of alcoholism, essential hypertension, hyperlipidemia, rheumatoid arthritis with history of Enbrel, liver cirrhosis and history of decompensated liver cirrhosis with ascites portal hypertensive gastropathy history of hepatic encephalopathy alcohol withdrawal, history of C. difficile colitis, pulmonary fibrosis, medical noncompliance, tobacco abuse, coming today for again brought by EMS from home with the last 3 days of feeling nauseated and weak and then this morning he noticed that he had several black tarry stools.  Patient had an upper endoscopy on 11/28/2023 Showed grade 1 varices in lower third of the esophagus severe portal hypertensive gastropathy submucosal polypoid deformity in the second portion of the duodenum, colonoscopy in July 2025 showing hemorrhoids: Showed excessive looping with redundancy, small angioectasia in the proximal ascending colon, multiple sessile polyps in the rectosigmoid sigmoid colon and descending colon small nonbleeding rectal varix normal mucosa no evidence of diverticulosis patient did have ablation of the AVM.     Assessment and plan:  Acute blood loss anemia likely secondary to GI bleed Patient with a history of liver cirrhosis and history of decompensated liver cirrhosis with ascites portal hypertensive gastropathy  Hemoglobin noted to be 6.7 upon presentation Patient underwent blood transfusion Continue Protonix  Patient seen by gastroenterologist and underwent EGD Will consider octreotide  infusion if hemoglobin drops to 7 or below along with Blood product transfusion.  INR is within normal limits, thrombocytopenia is significant and will follow. Monitor CBC closely Still n.p.o. as patient is not awake enough to  resume diet Continue to monitor neurochecks   Acute hepatic encephalopathy Decompensated liver cirrhosis Thrombocytopenia likely secondary to alcohol Patient presented with ammonia level of 306 Given significant ammonia levels and unable to give p.o. or insert NG tube given recent banding of esophageal varices today, GI has agreed for lactulose  enema Rifaximin  once patient is able to take p.o.  Chronic alcohol use  Counseled on alcohol cessation  Continue thiamine  Placed on CIWA protocol   Essential hypertension Currently on as needed hydralazine  Avoiding scheduled antihypertensives given GI bleed with normal blood pressure  Hypertension-resume statin when appropriate  History of rheumatoid arthritis Outpatient follow-up   DVT prophylaxis: SCD  Consults:  Gastroenterology   Advance Care Planning:    Code Status: Full Code    Family Communication: None at bedside   Subjective:  Patient seen and examined at bedside this morning Still encephalopathic unable to contribute to history  Physical Exam:    Extraocular Movements: Extraocular movements intact.  Cardiovascular:     Rate and Rhythm: Normal rate and regular rhythm.     Pulses: Normal pulses.     Heart sounds: Normal heart sounds.  Pulmonary:     Effort: Pulmonary effort is normal.     Breath sounds: Normal breath sounds.  Abdominal:     General: There is no distension.     Palpations: Abdomen is soft.     Tenderness: There is no abdominal tenderness.  Musculoskeletal: Bilateral lower extremity edema Neurological: Confused, disoriented  Vitals:   02/15/24 1250 02/15/24 1300 02/15/24 1325 02/15/24 1500  BP: (!) 148/93 (!) 143/96 (!) 133/99 (!) 114/57  Pulse: 93 92 84 76  Resp: 15 15 18 14   Temp:   98 F (36.7 C) 98 F (36.7 C)  TempSrc:  Axillary Axillary  SpO2: 100% 97% 98% 99%  Weight:      Height:        Data Reviewed: X-ray review showing subsegmental atelectasis    Latest Ref Rng & Units  02/15/2024    6:19 AM 02/14/2024   11:30 PM 02/14/2024    8:18 PM  CBC  WBC 4.0 - 10.5 K/uL 4.4     Hemoglobin 13.0 - 17.0 g/dL 9.7  6.7  7.4   Hematocrit 39.0 - 52.0 % 29.3  21.0  23.7   Platelets 150 - 400 K/uL 45          Latest Ref Rng & Units 02/15/2024    6:19 AM 02/14/2024    3:14 PM 02/07/2024    1:26 PM  BMP  Glucose 70 - 99 mg/dL 864  860  873    876   BUN 8 - 23 mg/dL 13  13  9    9    Creatinine 0.61 - 1.24 mg/dL 9.21  9.17  9.11    8.79   Sodium 135 - 145 mmol/L 139  136  140    143   Potassium 3.5 - 5.1 mmol/L 3.5  3.6  3.6    4.0   Chloride 98 - 111 mmol/L 104  100  105    104   CO2 22 - 32 mmol/L 23  23  23    Calcium  8.9 - 10.3 mg/dL 9.0  8.9  9.4       Author: Drue ONEIDA Potter, MD 02/15/2024 5:10 PM  For on call review [email or web address].  "

## 2024-02-15 NOTE — Progress Notes (Signed)
 Pt received complete bed change and transferred to ed

## 2024-02-15 NOTE — Op Note (Signed)
 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 Estimated blood loss:                            Minimal. Estimated Blood Loss:     Estimated blood loss was minimal. Procedure:                Pre-Anesthesia Assessment:                           - Prior to the procedure, a History and Physical                            was performed, and patient medications and                            allergies were reviewed. The patient's tolerance of                            previous anesthesia was also reviewed. The risks                            and benefits of the procedure and the sedation                            options and risks were discussed with the patient.                            All questions were answered, and informed consent                            was obtained. Prior Anticoagulants: The patient has                            taken no anticoagulant or antiplatelet agents.  ASA                            Grade Assessment: III - A patient with severe                            systemic disease. After reviewing the risks and                            benefits, the patient was deemed in satisfactory                            condition to undergo the procedure.                           After obtaining informed consent, the endoscope was                             passed under direct vision. Throughout the                            procedure, the patient's blood pressure, pulse, and                            oxygen saturations were monitored continuously. The                            [id number]-H190 ([id number]) Olympus endoscope was introduced                            through the mouth, and advanced to the second part                            of duodenum. The upper GI endoscopy was                            accomplished without difficulty. The patient                            tolerated the procedure well. Scope In: Scope Out: Findings:      The upper third of the esophagus was normal.      Grade II varices were found in the middle third of the esophagus and in       the lower third of the esophagus. These were not actively bleeding,       however, due to size and a red spot over 1 varix at the GE junction,       decision was made to perform esophageal variceal banding. Four bands       were successfully placed with incomplete eradication of varices.      Moderate portal hypertensive gastropathy was found in the entire       examined stomach. Normal retroflexion in the cardia and fundus. No       gastric varices. There was no evidence of active bleeding or stigmata of       recent bleeding in the stomach.  Nodular mucosa was found in the duodenal bulb.      The second portion of the duodenum was normal. No active bleeding or       stigmata of recent bleeding in the duodenum. Impression:               - Normal upper third of esophagus.                           - Grade II esophageal varices. Incompletely                            eradicated. Banded. These were not bleeding at the                            time of EGD but banded due to increase in size                            compared to previous procedures.                           - Portal hypertensive gastropathy.                           - Nodular mucosa  in the duodenal bulb.                           - Normal second portion of the duodenum.                           - Patient reported to have melena as well as                            bloody stools. There was no evidence of active                            bleeding or stigmata of recent bleeding in the                            upper GI tract. Similar to prior hospitalizations,                            it is unclear if the patient's presentation is due                            to upper versus lower GI bleeding. Colonoscopy                            07/2023 showed polyps and an angioectasia. Video                            capsule study 07/2023 unrevealing. Recommendation:           - Return patient to hospital ward for ongoing care.                           -  Continue to monitor for overt GI bleeding                           - Follow serial hemoglobin hematocrit and maintain                            hemoglobin greater than 7                           - Continue IV PPI for now given portal hypertensive                            gastropathy                           - No indication for octreotide  at this time                           - Patient may experience chest pain status post                            esophageal variceal banding. This usually improves                            within 48 to 72 hours. This can be managed with                            pain medications.                           - Patient will need follow-up EGD 4 weeks status                            post hospital discharge for reevaluation of                            esophageal varices. Procedure Code(s):        --- Professional ---                           [id number], Esophagogastroduodenoscopy, flexible,                            transoral; with band ligation of esophageal/gastric                            varices Diagnosis Code(s):        --- Professional ---                           I85.00,  Esophageal varices without bleeding                           K76.6, Portal hypertension                           K31.89,  Other diseases of stomach and duodenum                           K92.0, Hematemesis                           K92.1, Melena (includes Hematochezia) CPT copyright 2022 [employer]. All rights reserved. The codes documented in this report are preliminary and upon coder review may  be revised to meet current compliance requirements. Inocente Hausen, MD 02/15/2024 12:36:45 PM This report has been signed electronically. Number of Addenda: 0

## 2024-02-15 NOTE — ED Notes (Addendum)
 Pt had 7 beat run of vtach. This RN paged rounding Hospitalist RONAL Cleaver @ [phone] to notify of critical. Provider returned call @ [phone] and was made aware of EKG. Provider to order labs.   Lab advised that blood cannot be drawn until 2 hours post blood administration. This RN had provider paged again to notify. Provider returned message at 0425 to notify ok to wait to draw labs.   No new orders at this time

## 2024-02-15 NOTE — Anesthesia Procedure Notes (Addendum)
 Procedure Name: Intubation Date/Time: 02/15/2024 12:19 PM  Performed by: Arvell Edsel HERO, CRNAPre-anesthesia Checklist: Patient identified, Emergency Drugs available, Suction available and Patient being monitored Patient Re-evaluated:Patient Re-evaluated prior to induction Oxygen Delivery Method: Circle System Utilized Preoxygenation: Pre-oxygenation with 100% oxygen Induction Type: IV induction, Rapid sequence and Cricoid Pressure applied Ventilation: Mask ventilation without difficulty Laryngoscope Size: McGrath and 4 Grade View: Grade II Tube type: Oral Tube size: 7.5 mm Number of attempts: 1 Airway Equipment and Method: Stylet Placement Confirmation: ETT inserted through vocal cords under direct vision, positive ETCO2 and breath sounds checked- equal and bilateral Secured at: 24 cm Tube secured with: Tape Dental Injury: Teeth and Oropharynx as per pre-operative assessment

## 2024-02-15 NOTE — Anesthesia Preprocedure Evaluation (Signed)
"                                    Anesthesia Evaluation  Patient identified by MRN, date of birth, ID band Patient awake    Reviewed: Allergy & Precautions, H&P , NPO status , Patient's Chart, lab work & pertinent test results  Airway Mallampati: II   Neck ROM: full    Dental   Pulmonary shortness of breath, Current Smoker and Patient abstained from smoking. Interstitial lung disease   breath sounds clear to auscultation       Cardiovascular hypertension, + CAD   Rhythm:regular Rate:Normal     Neuro/Psych  PSYCHIATRIC DISORDERS Anxiety        GI/Hepatic ,,,(+) Cirrhosis     substance abuse  alcohol use  Endo/Other    Renal/GU stones     Musculoskeletal  (+) Arthritis ,    Abdominal   Peds  Hematology   Anesthesia Other Findings   Reproductive/Obstetrics                              Anesthesia Physical Anesthesia Plan  ASA: 3  Anesthesia Plan: MAC   Post-op Pain Management:    Induction: Intravenous  PONV Risk Score and Plan: 0 and Propofol  infusion and Treatment may vary due to age or medical condition  Airway Management Planned: Nasal Cannula  Additional Equipment:   Intra-op Plan:   Post-operative Plan:   Informed Consent: I have reviewed the patients History and Physical, chart, labs and discussed the procedure including the risks, benefits and alternatives for the proposed anesthesia with the patient or authorized representative who has indicated his/her understanding and acceptance.     Dental advisory given  Plan Discussed with: CRNA, Anesthesiologist and Surgeon  Anesthesia Plan Comments:         Anesthesia Quick Evaluation  "

## 2024-02-16 DIAGNOSIS — K921 Melena: Secondary | ICD-10-CM | POA: Diagnosis not present

## 2024-02-16 LAB — BASIC METABOLIC PANEL WITH GFR
Anion gap: 13 (ref 5–15)
Anion gap: 12 (ref 5–15)
BUN: 20 mg/dL (ref 8–23)
BUN: 22 mg/dL (ref 8–23)
CO2: 22 mmol/L (ref 22–32)
CO2: 20 mmol/L — ABNORMAL LOW (ref 22–32)
Calcium: 9 mg/dL (ref 8.9–10.3)
Calcium: 9.2 mg/dL (ref 8.9–10.3)
Chloride: 110 mmol/L (ref 98–111)
Chloride: 110 mmol/L (ref 98–111)
Creatinine, Ser: 0.95 mg/dL (ref 0.61–1.24)
Creatinine, Ser: 0.97 mg/dL (ref 0.61–1.24)
GFR, Estimated: >60 mL/min
GFR, Estimated: >60 mL/min
Glucose, Bld: 166 mg/dL — ABNORMAL HIGH (ref 70–99)
Glucose, Bld: 136 mg/dL — ABNORMAL HIGH (ref 70–99)
Potassium: 2.9 mmol/L — ABNORMAL LOW (ref 3.5–5.1)
Potassium: 3.7 mmol/L (ref 3.5–5.1)
Sodium: 144 mmol/L (ref 135–145)
Sodium: 142 mmol/L (ref 135–145)

## 2024-02-16 LAB — BPAM RBC
Blood Product Expiration Date: 202602122359
Blood Product Expiration Date: 202602132359
ISSUE DATE / TIME: 202601230024
ISSUE DATE / TIME: 202601230215
Unit Type and Rh: 5100
Unit Type and Rh: 5100

## 2024-02-16 LAB — HEMOGLOBIN AND HEMATOCRIT, BLOOD
HCT: 27.6 % — ABNORMAL LOW (ref 39.0–52.0)
HCT: 26.9 % — ABNORMAL LOW (ref 39.0–52.0)
Hemoglobin: 8.6 g/dL — ABNORMAL LOW (ref 13.0–17.0)
Hemoglobin: 8.6 g/dL — ABNORMAL LOW (ref 13.0–17.0)

## 2024-02-16 LAB — TYPE AND SCREEN
ABO/RH(D): O POS
Antibody Screen: NEGATIVE
Unit division: 0
Unit division: 0

## 2024-02-16 LAB — CBC WITH DIFFERENTIAL/PLATELET
Abs Immature Granulocytes: 0.03 10*3/uL (ref 0.00–0.07)
Basophils Absolute: 0 10*3/uL (ref 0.0–0.1)
Basophils Relative: 0 %
Eosinophils Absolute: 0 10*3/uL (ref 0.0–0.5)
Eosinophils Relative: 0 %
HCT: 26.7 % — ABNORMAL LOW (ref 39.0–52.0)
Hemoglobin: 8.7 g/dL — ABNORMAL LOW (ref 13.0–17.0)
Immature Granulocytes: 1 %
Lymphocytes Relative: 13 %
Lymphs Abs: 0.6 10*3/uL — ABNORMAL LOW (ref 0.7–4.0)
MCH: 23.8 pg — ABNORMAL LOW (ref 26.0–34.0)
MCHC: 32.6 g/dL (ref 30.0–36.0)
MCV: 73.2 fL — ABNORMAL LOW (ref 80.0–100.0)
Monocytes Absolute: 0.4 10*3/uL (ref 0.1–1.0)
Monocytes Relative: 10 %
Neutro Abs: 3.2 10*3/uL (ref 1.7–7.7)
Neutrophils Relative %: 76 %
Platelets: 47 10*3/uL — ABNORMAL LOW (ref 150–400)
RBC: 3.65 MIL/uL — ABNORMAL LOW (ref 4.22–5.81)
RDW: 19.9 % — ABNORMAL HIGH (ref 11.5–15.5)
WBC: 4.3 10*3/uL (ref 4.0–10.5)
nRBC: 0 % (ref 0.0–0.2)

## 2024-02-16 LAB — MAGNESIUM: Magnesium: 2 mg/dL (ref 1.7–2.4)

## 2024-02-16 LAB — PHOSPHORUS: Phosphorus: 2.4 mg/dL — ABNORMAL LOW (ref 2.5–4.6)

## 2024-02-16 LAB — AMMONIA: Ammonia: 49 umol/L — ABNORMAL HIGH (ref 9–35)

## 2024-02-16 MED ORDER — LACTULOSE 10 GM/15ML PO SOLN
20.0000 g | Freq: Three times a day (TID) | ORAL | Status: DC
  Administered 2024-02-17 – 2024-02-26 (×17): 20 g via ORAL
  Filled 2024-02-16 (×23): qty 30

## 2024-02-16 MED ORDER — POTASSIUM CHLORIDE 10 MEQ/100ML IV SOLN
10.0000 meq | INTRAVENOUS | Status: AC
  Administered 2024-02-16 (×6): 10 meq via INTRAVENOUS
  Filled 2024-02-16 (×6): qty 100

## 2024-02-16 MED ORDER — LACTULOSE ENEMA
300.0000 mL | Freq: Three times a day (TID) | ORAL | Status: DC
  Administered 2024-02-16 – 2024-02-20 (×10): 300 mL via RECTAL
  Filled 2024-02-16 (×18): qty 300

## 2024-02-16 MED ORDER — FOLIC ACID 5 MG/ML IJ SOLN
1.0000 mg | Freq: Every day | INTRAMUSCULAR | Status: DC
  Administered 2024-02-16 – 2024-02-20 (×3): 1 mg via INTRAVENOUS
  Filled 2024-02-16 (×5): qty 0.2

## 2024-02-16 MED ORDER — LACTATED RINGERS IV SOLN: INTRAVENOUS | Status: AC

## 2024-02-16 MED ORDER — FOLIC ACID 1 MG PO TABS
1.0000 mg | ORAL_TABLET | Freq: Every day | ORAL | Status: DC
  Administered 2024-02-17 – 2024-02-27 (×7): 1 mg via ORAL
  Filled 2024-02-16 (×9): qty 1

## 2024-02-16 NOTE — Plan of Care (Signed)
  Problem: Clinical Measurements: Goal: Will remain free from infection Outcome: Progressing   Problem: Coping: Goal: Level of anxiety will decrease Outcome: Progressing   Problem: Elimination: Goal: Will not experience complications related to bowel motility Outcome: Progressing Goal: Will not experience complications related to urinary retention Outcome: Progressing   Problem: Pain Managment: Goal: General experience of comfort will improve and/or be controlled Outcome: Progressing   Problem: Safety: Goal: Ability to remain free from injury will improve Outcome: Progressing   Problem: Skin Integrity: Goal: Risk for impaired skin integrity will decrease Outcome: Progressing

## 2024-02-16 NOTE — Progress Notes (Signed)
 " Progress Note   [name] FMW:[id number] DOB: 05/31/1960 DOA: 02/14/2024     2 DOS: the patient was seen and examined on 02/16/2024   Brief hospital course: From HPI Robert Greene is a 64 y.o. male with past medical history  of alcoholism, essential hypertension, hyperlipidemia, rheumatoid arthritis with history of Enbrel, liver cirrhosis and history of decompensated liver cirrhosis with ascites portal hypertensive gastropathy history of hepatic encephalopathy alcohol withdrawal, history of C. difficile colitis, pulmonary fibrosis, medical noncompliance, tobacco abuse, coming today for again brought by EMS from home with the last 3 days of feeling nauseated and weak and then this morning he noticed that he had several black tarry stools.  Patient had an upper endoscopy on 11/28/2023 Showed grade 1 varices in lower third of the esophagus severe portal hypertensive gastropathy submucosal polypoid deformity in the second portion of the duodenum, colonoscopy in July 2025 showing hemorrhoids: Showed excessive looping with redundancy, small angioectasia in the proximal ascending colon, multiple sessile polyps in the rectosigmoid sigmoid colon and descending colon small nonbleeding rectal varix normal mucosa no evidence of diverticulosis patient did have ablation of the AVM.     Assessment and plan:  Acute blood loss anemia likely secondary to GI bleed Patient with a history of liver cirrhosis and history of decompensated liver cirrhosis with ascites portal hypertensive gastropathy  Hemoglobin noted to be 6.7 upon presentation Patient underwent blood transfusion Continue Protonix  Patient seen by gastroenterologist and underwent EGD Will consider octreotide  infusion if hemoglobin drops to 7 or below along with Blood product transfusion.  INR is within normal limits, thrombocytopenia is significant and will follow. Monitor CBC closely Still n.p.o. as patient is not awake enough to  resume diet Continue to monitor neurochecks   Hypokalemia, potassium repleted. Hypophosphatemia, monitor Monitor electrolytes and replete as needed.   Acute hepatic encephalopathy Decompensated liver cirrhosis Thrombocytopenia likely secondary to alcohol Patient presented with ammonia level of 306 Given significant ammonia levels and unable to give p.o. or insert NG tube given recent banding of esophageal varices today, GI has agreed for lactulose  enema Rifaximin  once patient is able to take p.o.  Chronic alcohol use  Counseled on alcohol cessation  Continue thiamine  Placed on CIWA protocol   Essential hypertension Currently on as needed hydralazine  Avoiding scheduled antihypertensives given GI bleed with normal blood pressure  Hypertension-resume statin when appropriate  History of rheumatoid arthritis Outpatient follow-up   DVT prophylaxis: SCD  Consults:  Gastroenterology   Advance Care Planning:    Code Status: Full Code    Family Communication: None at bedside   Subjective:  Patient seen and examined at bedside this morning Still encephalopathic unable to offer any complaints, lying comfortably, without any acute distress.  Physical Exam:    Extraocular Movements: Extraocular movements intact.  Cardiovascular:     Rate and Rhythm: Normal rate and regular rhythm.     Pulses: Normal pulses.     Heart sounds: Normal heart sounds.  Pulmonary:     Effort: Pulmonary effort is normal.     Breath sounds: Normal breath sounds.  Abdominal:     General: There is no distension.     Palpations: Abdomen is soft.     Tenderness: There is no abdominal tenderness.  Musculoskeletal: Bilateral lower extremity edema Neurological: Confused, disoriented  Vitals:   02/16/24 0719 02/16/24 0811 02/16/24 1123 02/16/24 1203  BP: 109/65 (!) 135/99 (!) 147/88 (!) 151/87  Pulse: (!) 111 (!) 102 99 100  Resp:  16  20   Temp: 99.6 F (37.6 C)  98.3 F (36.8 C)   TempSrc: Oral   Oral   SpO2: 97%     Weight:      Height:        Data Reviewed: X-ray review showing subsegmental atelectasis    Latest Ref Rng & Units 02/16/2024   12:18 PM 02/16/2024    2:10 AM 02/15/2024    6:19 AM  CBC  WBC 4.0 - 10.5 K/uL  4.3  4.4   Hemoglobin 13.0 - 17.0 g/dL 8.6  8.7  9.7   Hematocrit 39.0 - 52.0 % 27.6  26.7  29.3   Platelets 150 - 400 K/uL  47  45        Latest Ref Rng & Units 02/16/2024   12:18 PM 02/16/2024    2:10 AM 02/15/2024    6:19 AM  BMP  Glucose 70 - 99 mg/dL 863  833  864   BUN 8 - 23 mg/dL 22  20  13    Creatinine 0.61 - 1.24 mg/dL 9.02  9.04  9.21   Sodium 135 - 145 mmol/L 142  144  139   Potassium 3.5 - 5.1 mmol/L 3.7  2.9  3.5   Chloride 98 - 111 mmol/L 110  110  104   CO2 22 - 32 mmol/L 20  22  23    Calcium  8.9 - 10.3 mg/dL 9.2  9.0  9.0     Total time spent: 55 minutes  Author: Elvan Sor, MD 02/16/2024 2:14 PM  For on call review [email or web address].  "

## 2024-02-16 NOTE — Progress Notes (Signed)
 [facility] night cross cover note:   Regarding this patient with chronic alcohol abuse, alcoholic cirrhosis, who is being admitted with acute GI bleed, for which he underwent EGD today, alcohol withdrawal, RN complains that the patient is altered, confused.  He is noted to be on lactulose  and is also undergoing CIWA protocol in the setting of concern for alcohol withdrawal.  Vital signs at this time are notable for the following: Afebrile; heart rates in the 130s associated with sinus tachycardia yes, systolic blood pressures in the 170s, respiratory rate 17-20, and oxygen saturation 96 to 99% on room air.  Suspect a contribution from alcohol withdrawal, and will proceed with symptoms based CIWA protocol with prn Ativan .  However, in the setting of his presenting acute upper gastrointestinal bleed, and worsening sinus tachycardia, levels ordered a stat CBC for further trending and hemoglobin level and I placed an order for a updated type and screen.   Shortly after placing these orders, the patient had a large bloody bowel movement with some associated clots.  Relative to his sinus tachycardia he is noted to be throwing frequent PVCs.  I started him on continuous lactated Ringer 's at 100 cc/h x 8 hours.  A.m. labs, which includes BMP , to be drawn early.  In light of the patient's chronic alcohol abuse, and frequent PVCs, I have also added magnesium  level.   Update: Updated labs notable for CBC showing hemoglobin of 8.7 down from most recent prior value of 9.7 on the morning of 02/15/2024.  No indication for PRBC transfusion at this time.  However, I have ordered an updated H&H to be checked later this morning.  BMP notable for potassium level of 2.9, with creatinine 0.95.  Particularly in the setting of his frequent PVCs, I have ordered potassium chloride  60 mill equivalents IV over 6 hours.  Will decrease the rate of his continuous lactated Ringer 's from 100 cc/h to 50 cc/h while he is receiving these K riders.   Also ordered repeat BMP to further trend potassium level, with his BMP to be checked around 11 AM, corresponding with timing of repeat CBC draw.  Updated magnesium  level 2.0.     Eva Pore, DO Hospitalist

## 2024-02-16 NOTE — Progress Notes (Signed)
 Subjective: Hematochezia with lactulose  enema at 10 PM last evening.  Objective: Vital signs in last 24 hours: Temp:  [98 F (36.7 C)-99.7 F (37.6 C)] 99.6 F (37.6 C) (01/24 0719) Pulse Rate:  [75-132] 102 (01/24 0811) Resp:  [13-21] 16 (01/24 0719) BP: (109-179)/(57-104) 135/99 (01/24 0811) SpO2:  [95 %-100 %] 97 % (01/24 0719) Last BM Date : 02/15/24  Intake/Output from previous day: 01/23 0701 - 01/24 0700 In: 922.6 [I.V.:439.3; IV Piggyback:483.3] Out: 400 [Urine:400] Intake/Output this shift: No intake/output data recorded.  General appearance: confused, writhing around in bed GI: soft, non-tender; bowel sounds normal; no masses,  no organomegaly  Lab Results: Recent Labs    02/14/24 1514 02/14/24 2018 02/14/24 2330 02/15/24 0619 02/16/24 0210  WBC 4.4  --   --  4.4 4.3  HGB 8.5*   < > 6.7* 9.7* 8.7*  HCT 26.7*   < > 21.0* 29.3* 26.7*  PLT 57*  --   --  45* 47*   < > = values in this interval not displayed.   BMET Recent Labs    02/14/24 1514 02/15/24 0619 02/16/24 0210  NA 136 139 144  K 3.6 3.5 2.9*  CL 100 104 110  CO2 23 23 22   GLUCOSE 139* 135* 166*  BUN 13 13 20   CREATININE 0.82 0.78 0.95  CALCIUM  8.9 9.0 9.0   LFT Recent Labs    02/15/24 0619  PROT 6.7  ALBUMIN  3.5  AST 65*  ALT 26  ALKPHOS 118  BILITOT 2.3*   PT/INR Recent Labs    02/14/24 1514  LABPROT 16.2*  INR 1.2   Hepatitis Panel No results for input(s): HEPBSAG, HCVAB, HEPAIGM, HEPBIGM in the last 72 hours. C-Diff No results for input(s): CDIFFTOX in the last 72 hours. Fecal Lactopherrin No results for input(s): FECLLACTOFRN in the last 72 hours.  Studies/Results: No results found.  Medications: Scheduled:  folic acid   1 mg Oral Daily   Or   folic acid   1 mg Intravenous Daily   lactulose   300 mL Rectal TID   Or   lactulose   20 g Oral TID   multivitamin with minerals  1 tablet Oral Daily   pantoprazole  (PROTONIX ) IV  40 mg Intravenous Q12H    thiamine   100 mg Oral Daily   Or   thiamine   100 mg Intravenous Daily   Continuous:  cefTRIAXone  (ROCEPHIN )  IV Stopped (02/15/24 2132)   potassium chloride  10 mEq (02/16/24 1007)    Assessment/Plan: 1) Anemia. 2) Hematochezia. 3) Hepatic encephalopathy. 4) Decompensated ETOH cirrhosis.   Nursing reported hematochezia with yesterday's lactulose  enema.  There was a 1 gram drop of his HGB overnight.  He remains hemodynamically stable, but he has AMS from his hepatic encephalopathy.  There is the possibility of a colonic source for his GI bleed.  Plan: 1) Continue to monitor HGB. 2) If hematochezia persists, a CTA is recommended. 3) Continue with lactulose  enemas and orally. 4) Correct hypokalemia.  LOS: 2 days   Amandalee Lacap D 02/16/2024, 10:48 AM

## 2024-02-17 ENCOUNTER — Encounter (HOSPITAL_COMMUNITY): Payer: Self-pay | Admitting: Pediatrics

## 2024-02-17 DIAGNOSIS — K921 Melena: Secondary | ICD-10-CM | POA: Diagnosis not present

## 2024-02-17 LAB — BASIC METABOLIC PANEL WITH GFR
Anion gap: 11 (ref 5–15)
BUN: 21 mg/dL (ref 8–23)
CO2: 21 mmol/L — ABNORMAL LOW (ref 22–32)
Calcium: 9 mg/dL (ref 8.9–10.3)
Chloride: 113 mmol/L — ABNORMAL HIGH (ref 98–111)
Creatinine, Ser: 0.85 mg/dL (ref 0.61–1.24)
GFR, Estimated: >60 mL/min
Glucose, Bld: 112 mg/dL — ABNORMAL HIGH (ref 70–99)
Potassium: 3.2 mmol/L — ABNORMAL LOW (ref 3.5–5.1)
Sodium: 145 mmol/L (ref 135–145)

## 2024-02-17 LAB — CBC
HCT: 26.1 % — ABNORMAL LOW (ref 39.0–52.0)
Hemoglobin: 8.2 g/dL — ABNORMAL LOW (ref 13.0–17.0)
MCH: 23.7 pg — ABNORMAL LOW (ref 26.0–34.0)
MCHC: 31.4 g/dL (ref 30.0–36.0)
MCV: 75.4 fL — ABNORMAL LOW (ref 80.0–100.0)
Platelets: 58 10*3/uL — ABNORMAL LOW (ref 150–400)
RBC: 3.46 MIL/uL — ABNORMAL LOW (ref 4.22–5.81)
RDW: 20.6 % — ABNORMAL HIGH (ref 11.5–15.5)
WBC: 7.4 10*3/uL (ref 4.0–10.5)
nRBC: 0 % (ref 0.0–0.2)

## 2024-02-17 LAB — HEPATIC FUNCTION PANEL
ALT: 25 U/L (ref 0–44)
AST: 64 U/L — ABNORMAL HIGH (ref 15–41)
Albumin: 3.3 g/dL — ABNORMAL LOW (ref 3.5–5.0)
Alkaline Phosphatase: 102 U/L (ref 38–126)
Bilirubin, Direct: 0.8 mg/dL — ABNORMAL HIGH (ref 0.0–0.2)
Indirect Bilirubin: 0.8 mg/dL (ref 0.3–0.9)
Total Bilirubin: 1.6 mg/dL — ABNORMAL HIGH (ref 0.0–1.2)
Total Protein: 6 g/dL — ABNORMAL LOW (ref 6.5–8.1)

## 2024-02-17 LAB — PHOSPHORUS: Phosphorus: 2.6 mg/dL (ref 2.5–4.6)

## 2024-02-17 LAB — GLUCOSE, CAPILLARY: Glucose-Capillary: 110 mg/dL — ABNORMAL HIGH (ref 70–99)

## 2024-02-17 LAB — AMMONIA: Ammonia: 33 umol/L (ref 9–35)

## 2024-02-17 LAB — MAGNESIUM: Magnesium: 2.1 mg/dL (ref 1.7–2.4)

## 2024-02-17 MED ORDER — DIAZEPAM 5 MG/ML IJ SOLN
5.0000 mg | INTRAMUSCULAR | Status: DC | PRN
  Administered 2024-02-17: 5 mg via INTRAVENOUS
  Filled 2024-02-17 (×3): qty 2

## 2024-02-17 MED ORDER — DIAZEPAM 5 MG/ML IJ SOLN
10.0000 mg | INTRAMUSCULAR | Status: DC | PRN
  Administered 2024-02-17: 10 mg via INTRAVENOUS
  Filled 2024-02-17: qty 2

## 2024-02-17 MED ORDER — HALOPERIDOL LACTATE 5 MG/ML IJ SOLN
2.0000 mg | Freq: Four times a day (QID) | INTRAMUSCULAR | Status: AC | PRN
  Administered 2024-02-17 – 2024-02-28 (×9): 2 mg via INTRAVENOUS
  Filled 2024-02-17 (×9): qty 1

## 2024-02-17 MED ORDER — POTASSIUM CHLORIDE 10 MEQ/100ML IV SOLN
10.0000 meq | INTRAVENOUS | Status: AC
  Administered 2024-02-17 (×4): 10 meq via INTRAVENOUS
  Filled 2024-02-17 (×4): qty 100

## 2024-02-17 MED ORDER — LORAZEPAM 2 MG/ML IJ SOLN: 0.5000 mg/h | INTRAVENOUS | Status: DC

## 2024-02-17 MED ORDER — DIAZEPAM 5 MG/ML IJ SOLN
5.0000 mg | Freq: Once | INTRAMUSCULAR | Status: AC
  Administered 2024-02-17: 5 mg via INTRAVENOUS
  Filled 2024-02-17: qty 2

## 2024-02-17 MED ORDER — HALOPERIDOL LACTATE 5 MG/ML IJ SOLN
5.0000 mg | Freq: Once | INTRAMUSCULAR | Status: AC
  Administered 2024-02-17: 5 mg via INTRAVENOUS
  Filled 2024-02-17: qty 1

## 2024-02-17 NOTE — Consult Note (Incomplete)
 "  NAME:  Robert Greene, MRN:  [id number], DOB:  06-25-1960, LOS: 3 ADMISSION DATE:  02/14/2024, CONSULTATION DATE:  1/25 REFERRING MD:  Dr. Von, CHIEF COMPLAINT:  gib; alcohol withdrawal  History of Present Illness:  Patient is a 64 yo M w/ pertinent PMH alcohol abuse w/ hx of alcohol withdrawal, Liver cirrhosis w/ ascites, ILD, tobacco abuse, htn, hld RA presents to [facility] on 1/22 w/ GIB.  Patient had endoscopy on 11/2023 showing grade 1 varices in lower third esophagus w/ severe portal hypertensive gastropathy. Patient came to [facility] on 1/22 w/ black tarry stools. Per son patient is still drinking. On arrival hgb 8.5 and platelets 57. Fecal occult positive. Alcohol level 141. LFTs mildly elevated and INR wnl. Ammonia 306. Patient given PPI and GI consulted. Started on empiric rocephin . Unable to give rifaximin  while npo. Giving lactulose  enemas. EGD on 1/23 grade II esophageal varices. While in hospital patient with encephalopathy. CIWA protocol in place. On 1/25 ammonia improved to 33. Overnight patient waking up more trying to get out of bed and confused/agitated. PCCM consulted.  Pertinent  Medical History   Past Medical History:  Diagnosis Date   Alcoholism (HCC)    Anxiety    Arthritis    rheumatoid   Cirrhosis (HCC)    Coronary artery calcification seen on CAT scan 04/08/2018   Dyspnea    HTN (hypertension) 04/08/2018   Hypercholesteremia    Hypertension    Interstitial lung disease (HCC)    Kidney stones 2020   Pulmonary fibrosis (HCC)    Rheumatoid arthritis (HCC)      Significant Hospital Events: Including procedures, antibiotic start and stop dates in addition to other pertinent events   1/22 admit 1/25 agitation likely from alcohol withdrawal; pccm consulted  Interim History / Subjective:  See above  Objective    Blood pressure (!) 140/106, pulse (!) 130, temperature 98.3 F (36.8 C), temperature source Axillary, resp. rate 18, height 5' 9 (1.753 m), weight 90.7  kg, SpO2 95%.        Intake/Output Summary (Last 24 hours) at 02/17/2024 2344 Last data filed at 02/17/2024 1532 Gross per 24 hour  Intake 930.12 ml  Output 2000 ml  Net -1069.88 ml   Filed Weights   02/14/24 1457  Weight: 90.7 kg    Examination: General: confused male in NAD HEENT: MM pink/moist Neuro: confused stating wanting to go home but not oriented; mae spontaneously CV: s1s2, RRR, no m/r/g PULM:  dim clear BS bilaterally; room air GI: soft, bsx4 active  Extremities: warm/dry    Resolved problem list   Assessment and Plan   Alcohol withdrawal Acute hepatic encephalopathy Decompensated liver cirrhosis Plan: -will add phenobarb taper -spoke w/ attending recommending starting valium  scheduled to taper off in several days -thiamine , folic acid , mvi -cessation counseling when appropriate -cont lactulose  enemas -no icu needs at this time. If patient remains agitated despite above, may need to consider icu and starting precedex . PCCM will sign off and available as needed.  ILD: follows w/ Dr. Geronimo Tobacco abuse Plan: -nicotine  patch -duoneb prn -cessation counseling when appropriate -f/u w/ pulm outpt  ABLA 2/2 to GIB Thrombocytopenia likely secondary to alcohol and cirrhosis  Htn Hld RA Plan: -per primary and GI  Labs   CBC: Recent Labs  Lab 02/14/24 1514 02/14/24 2018 02/15/24 0619 02/16/24 0210 02/16/24 1218 02/16/24 1641 02/17/24 0209  WBC 4.4  --  4.4 4.3  --   --  7.4  NEUTROABS  --   --   --  3.2  --   --   --   HGB 8.5*   < > 9.7* 8.7* 8.6* 8.6* 8.2*  HCT 26.7*   < > 29.3* 26.7* 27.6* 26.9* 26.1*  MCV 71.0*  --  72.0* 73.2*  --   --  75.4*  PLT 57*  --  45* 47*  --   --  58*   < > = values in this interval not displayed.    Basic Metabolic Panel: Recent Labs  Lab 02/14/24 1514 02/14/24 2018 02/15/24 0619 02/16/24 0210 02/16/24 1218 02/17/24 0209  NA 136  --  139 144 142 145  K 3.6  --  3.5 2.9* 3.7 3.2*  CL 100  --   104 110 110 113*  CO2 23  --  23 22 20* 21*  GLUCOSE 139*  --  135* 166* 136* 112*  BUN 13  --  13 20 22 21   CREATININE 0.82  --  0.78 0.95 0.97 0.85  CALCIUM  8.9  --  9.0 9.0 9.2 9.0  MG  --  1.7 1.8 2.0  --  2.1  PHOS  --   --   --   --  2.4* 2.6   GFR: Estimated Creatinine Clearance: 99 mL/min (by C-G formula based on SCr of 0.85 mg/dL). Recent Labs  Lab 02/14/24 1514 02/15/24 0619 02/16/24 0210 02/17/24 0209  WBC 4.4 4.4 4.3 7.4    Liver Function Tests: Recent Labs  Lab 02/14/24 1514 02/15/24 0619 02/17/24 0209  AST 67* 65* 64*  ALT 26 26 25   ALKPHOS 132* 118 102  BILITOT 1.0 2.3* 1.6*  PROT 6.9 6.7 6.0*  ALBUMIN  3.6 3.5 3.3*   Recent Labs  Lab 02/14/24 1514  LIPASE 86*   Recent Labs  Lab 02/14/24 1514 02/16/24 0210 02/17/24 0209  AMMONIA 306* 49* 33    ABG    Component Value Date/Time   PHART 7.429 12/07/2017 0416   PCO2ART 36.8 12/07/2017 0416   PO2ART 86.2 12/07/2017 0416   HCO3 25.9 01/14/2024 0401   TCO2 25 02/07/2024 1326   ACIDBASEDEF 3.0 (H) 11/09/2023 2049   O2SAT 89.1 01/14/2024 0401     Coagulation Profile: Recent Labs  Lab 02/14/24 1514  INR 1.2    Cardiac Enzymes: No results for input(s): CKTOTAL, CKMB, CKMBINDEX, TROPONINI in the last 168 hours.  HbA1C: Hgb A1c MFr Bld  Date/Time Value Ref Range Status  01/01/2024 08:19 AM 5.2 4.8 - 5.6 % Final    Comment:    (NOTE) Diagnosis of Diabetes The following HbA1c ranges recommended by the American Diabetes Association (ADA) may be used as an aid in the diagnosis of diabetes mellitus.  Hemoglobin             Suggested A1C NGSP%              Diagnosis  <5.7                   Non Diabetic  5.7-6.4                Pre-Diabetic  >6.4                   Diabetic  <7.0                   Glycemic control for                       adults with diabetes.  CBG: Recent Labs  Lab 02/17/24 2321  GLUCAP 110*    Review of Systems:   Patient is encephalopathic;  therefore, history has been obtained from chart review.    Past Medical History:  He,  has a past medical history of Alcoholism (HCC), Anxiety, Arthritis, Cirrhosis (HCC), Coronary artery calcification seen on CAT scan (04/08/2018), Dyspnea, HTN (hypertension) (04/08/2018), Hypercholesteremia, Hypertension, Interstitial lung disease (HCC), Kidney stones (2020), Pulmonary fibrosis (HCC), and Rheumatoid arthritis (HCC).   Surgical History:   Past Surgical History:  Procedure Laterality Date   COLONOSCOPY N/A 08/03/2023   Procedure: COLONOSCOPY;  Surgeon: Mansouraty, Aloha Raddle., MD;  Location: [facility];  Service: Gastroenterology;  Laterality: N/A;   ESOPHAGEAL BANDING  02/15/2024   Procedure: ESOPHAGOSCOPY, WITH ESOPHAGEAL VARICES BAND LIGATION;  Surgeon: Suzann Inocente HERO, MD;  Location: [facility];  Service: Gastroenterology;;   ESOPHAGOGASTRODUODENOSCOPY N/A 08/03/2023   Procedure: EGD (ESOPHAGOGASTRODUODENOSCOPY);  Surgeon: Wilhelmenia Aloha Raddle., MD;  Location: [facility];  Service: Gastroenterology;  Laterality: N/A;   ESOPHAGOGASTRODUODENOSCOPY N/A 09/16/2023   Procedure: EGD (ESOPHAGOGASTRODUODENOSCOPY);  Surgeon: Nandigam, Kavitha V, MD;  Location: [facility];  Service: Gastroenterology;  Laterality: N/A;   ESOPHAGOGASTRODUODENOSCOPY N/A 11/28/2023   Procedure: EGD (ESOPHAGOGASTRODUODENOSCOPY);  Surgeon: Avram Lupita BRAVO, MD;  Location: [facility];  Service: Gastroenterology;  Laterality: N/A;   ESOPHAGOGASTRODUODENOSCOPY N/A 02/15/2024   Procedure: EGD (ESOPHAGOGASTRODUODENOSCOPY);  Surgeon: Suzann Inocente HERO, MD;  Location: [facility] ENDOSCOPY;  Service: Gastroenterology;  Laterality: N/A;   KNEE CARTILAGE SURGERY Left    x2   LUNG BIOPSY Right 12/06/2017   Procedure: LUNG BIOPSY;  Surgeon: Kerrin Elspeth BROCKS, MD;  Location: [facility] OR;  Service: Thoracic;  Laterality: Right;   VIDEO ASSISTED THORACOSCOPY Right 12/06/2017   Procedure: VIDEO ASSISTED THORACOSCOPY;  Surgeon:  Kerrin Elspeth BROCKS, MD;  Location: [facility] OR;  Service: Thoracic;  Laterality: Right;     Social History:   reports that he has been smoking cigarettes. He has a 60 pack-year smoking history. He has never used smokeless tobacco. He reports that he does not currently use alcohol. He reports that he does not use drugs.   Family History:  His family history includes Cirrhosis in his father. There is no history of Colon cancer, Stomach cancer, Esophageal cancer, Inflammatory bowel disease, Liver disease, Pancreatic cancer, or Rectal cancer.   Allergies Allergies[1]   Home Medications  Prior to Admission medications  Medication Sig Start Date End Date Taking? Authorizing Provider  carvedilol  (COREG ) 3.125 MG tablet Take 1 tablet (3.125 mg total) by mouth 2 (two) times daily with a meal. 09/17/23  Yes Rosario Leatrice FERNS, MD  diphenhydrAMINE  (BENADRYL  ALLERGY) 25 MG tablet Take 25 mg by mouth every 6 (six) hours as needed for itching.   Yes [provider]  EPINEPHrine  0.3 mg/0.3 mL IJ SOAJ injection Use once.  If ineffective, use 2nd dose. 11/19/16  Yes Dean Clarity, MD  famotidine  (PEPCID ) 20 MG tablet Take 1 tablet (20 mg total) by mouth 2 (two) times daily. 01/16/24  Yes Gonfa, Taye T, MD  folic acid  (FOLVITE ) 1 MG tablet Take 1 tablet (1 mg total) by mouth daily. 01/16/24  Yes Gonfa, Taye T, MD  furosemide  (LASIX ) 20 MG tablet Take 1 tablet (20 mg total) by mouth daily. 01/16/24  Yes Gonfa, Taye T, MD  hydrocortisone cream 1 % Apply 1 Application topically 2 (two) times daily as needed for itching (if out of triamcinolone  cream).   Yes [provider]  lactulose  (CHRONULAC ) 10 GM/15ML solution Take 30  mLs (20 g total) by mouth once (1) to three (3) times daily for a goal of 2-3 bowel movements a day. 01/16/24  Yes Gonfa, Taye T, MD  spironolactone  (ALDACTONE ) 50 MG tablet Take 1 tablet (50 mg total) by mouth daily. 01/16/24  Yes Gonfa, Taye T, MD  Thiamine  HCl (B-1) 100 MG  TABS Take 1 tablet (100 mg total) by mouth daily. 01/16/24  Yes Gonfa, Taye T, MD  triamcinolone  cream (KENALOG) 0.1 % Apply 1 Application topically 2 (two) times daily. 12/07/23  Yes [provider]  [Paused] etanercept (ENBREL) 50 MG/ML injection Inject 50 mg into the skin every Saturday. Patient not taking: Reported on 01/14/2024 Wait to take this until your doctor or other care provider tells you to start again.    [provider]     Critical care time: na     RODGER Emilio DEVONNA [facility] 02/17/2024, 11:44 PM  Please see [email or web address] for pager details.  From 7A-7P if no response, please call [phone]. After hours, please call ELink [phone].          [1]  Allergies Allergen Reactions   Bee Venom Anaphylaxis, Hives and Rash   Spider Antivenin [S Black Widow (Latrodec Mactans) Antivenin] Anaphylaxis, Hives and Rash   "

## 2024-02-17 NOTE — Care Management (Signed)
 SDOH resources for food insecurity added to AVS

## 2024-02-17 NOTE — Progress Notes (Signed)
 Subjective: See below.  Objective: Vital signs in last 24 hours: Temp:  [97.5 F (36.4 C)-98.9 F (37.2 C)] 98.3 F (36.8 C) (01/25 1913) Pulse Rate:  [96-132] 111 (01/25 1913) Resp:  [13-29] 15 (01/25 1913) BP: (108-151)/(65-119) 144/91 (01/25 1913) SpO2:  [93 %-100 %] 98 % (01/25 1913) Last BM Date : 02/16/24  Intake/Output from previous day: 01/24 0701 - 01/25 0700 In: 720.7 [I.V.:320.7; IV Piggyback:100] Out: 900 [Urine:400; Stool:500] Intake/Output this shift: No intake/output data recorded.  General appearance: confused, disoriented GI: soft, nontender  Lab Results: Recent Labs    02/15/24 0619 02/16/24 0210 02/16/24 1218 02/16/24 1641 02/17/24 0209  WBC 4.4 4.3  --   --  7.4  HGB 9.7* 8.7* 8.6* 8.6* 8.2*  HCT 29.3* 26.7* 27.6* 26.9* 26.1*  PLT 45* 47*  --   --  58*   BMET Recent Labs    02/16/24 0210 02/16/24 1218 02/17/24 0209  NA 144 142 145  K 2.9* 3.7 3.2*  CL 110 110 113*  CO2 22 20* 21*  GLUCOSE 166* 136* 112*  BUN 20 22 21   CREATININE 0.95 0.97 0.85  CALCIUM  9.0 9.2 9.0   LFT Recent Labs    02/17/24 0209  PROT 6.0*  ALBUMIN  3.3*  AST 64*  ALT 25  ALKPHOS 102  BILITOT 1.6*  BILIDIR 0.8*  IBILI 0.8   PT/INR No results for input(s): LABPROT, INR in the last 72 hours. Hepatitis Panel No results for input(s): HEPBSAG, HCVAB, HEPAIGM, HEPBIGM in the last 72 hours. C-Diff No results for input(s): CDIFFTOX in the last 72 hours. Fecal Lactopherrin No results for input(s): FECLLACTOFRN in the last 72 hours.  Studies/Results: No results found.  Medications: Scheduled:  folic acid   1 mg Oral Daily   Or   folic acid   1 mg Intravenous Daily   lactulose   300 mL Rectal TID   Or   lactulose   20 g Oral TID   multivitamin with minerals  1 tablet Oral Daily   pantoprazole  (PROTONIX ) IV  40 mg Intravenous Q12H   thiamine   100 mg Oral Daily   Or   thiamine   100 mg Intravenous Daily   Continuous:  cefTRIAXone   (ROCEPHIN )  IV 1 g (02/17/24 1842)    Assessment/Plan: 1) Anemia. 2) Hematochezia. 3) Hepatic encephalopathy. 4) Decompensated ETOH cirrhosis. 5) ETOH withdraw.   The patient remains significantly encephalopathic.  He was restless and wanting to get out of the bed.  It is likely that his AMS is compounded with ETOH withdraw.  His HGB remains stable and there were no reports of any further hematochezia.  Plan: 1) Continue treat with lactulose . 2) Maintain CIWA. 3) Follow HGB. 4) Dr. Wilhelmenia will assume care in the AM.  LOS: 3 days   Mckynzie Liwanag D 02/17/2024, 8:10 PM

## 2024-02-17 NOTE — Progress Notes (Signed)
 " Progress Note   [name] FMW:[id number] DOB: 02/27/1960 DOA: 02/14/2024     3 DOS: the patient was seen and examined on 02/17/2024   Brief hospital course: From HPI Robert Greene is a 64 y.o. male with past medical history  of alcoholism, essential hypertension, hyperlipidemia, rheumatoid arthritis with history of Enbrel, liver cirrhosis and history of decompensated liver cirrhosis with ascites portal hypertensive gastropathy history of hepatic encephalopathy alcohol withdrawal, history of C. difficile colitis, pulmonary fibrosis, medical noncompliance, tobacco abuse, coming today for again brought by EMS from home with the last 3 days of feeling nauseated and weak and then this morning he noticed that he had several black tarry stools.  Patient had an upper endoscopy on 11/28/2023 Showed grade 1 varices in lower third of the esophagus severe portal hypertensive gastropathy submucosal polypoid deformity in the second portion of the duodenum, colonoscopy in July 2025 showing hemorrhoids: Showed excessive looping with redundancy, small angioectasia in the proximal ascending colon, multiple sessile polyps in the rectosigmoid sigmoid colon and descending colon small nonbleeding rectal varix normal mucosa no evidence of diverticulosis patient did have ablation of the AVM.     Assessment and plan:  Acute blood loss anemia likely secondary to GI bleed Patient with a history of liver cirrhosis and history of decompensated liver cirrhosis with ascites portal hypertensive gastropathy  Hemoglobin noted to be 6.7 upon presentation Patient underwent blood transfusion Continue Protonix  Patient seen by gastroenterologist and underwent EGD Will consider octreotide  infusion if hemoglobin drops to 7 or below along with Blood product transfusion.  INR is within normal limits, thrombocytopenia is significant and will follow. Monitor CBC closely Still n.p.o. as patient is not awake enough to  resume diet Continue to monitor neurochecks   Hypokalemia, potassium repleted. Hypophosphatemia, monitor Monitor electrolytes and replete as needed.   Acute hepatic encephalopathy Decompensated liver cirrhosis Thrombocytopenia likely secondary to alcohol and cirrhosis Patient presented with ammonia level of 306 Given significant ammonia levels and unable to give p.o. or insert NG tube given recent banding of esophageal varices today, GI has agreed for lactulose  enema Rifaximin  once patient is able to take p.o.  Chronic alcohol use  Counseled on alcohol cessation  Continue thiamine  Continue CIWA protocol  1/25 CIWA score 15 in the a.m., patient received Ativan  as needed   Essential hypertension Currently on as needed hydralazine  Avoiding scheduled antihypertensives given GI bleed with normal blood pressure  Hyperlipidemia: resume statin when appropriate  History of rheumatoid arthritis Outpatient follow-up   DVT prophylaxis: SCD  Consults:  Gastroenterology   Advance Care Planning:    Code Status: Full Code    Family Communication: None at bedside   Subjective:  Patient seen and examined at bedside this morning Still encephalopathic unable to offer any complaints, lying comfortably, without any acute distress. CIWA score was 15 as per RN, patient received Ativan  so he was resting comfortably  Physical Exam:    Extraocular Movements: Extraocular movements intact.  Cardiovascular:     Rate and Rhythm: Normal rate and regular rhythm.     Pulses: Normal pulses.     Heart sounds: Normal heart sounds.  Pulmonary:     Effort: Pulmonary effort is normal.     Breath sounds: Normal breath sounds.  Abdominal:     General: There is no distension.     Palpations: Abdomen is soft.     Tenderness: There is no abdominal tenderness.  Musculoskeletal: Bilateral lower extremity edema Neurological: Confused, disoriented  Vitals:   02/17/24 0903 02/17/24 1003 02/17/24 1057  02/17/24 1100  BP: (!) 148/97 (!) 150/119 (!) 151/98 (!) 151/98  Pulse: (!) 123 (!) 109 (!) 115 (!) 115  Resp: (!) 27 20 (!) 21 (!) 29  Temp:  97.8 F (36.6 C) 97.7 F (36.5 C)   TempSrc:   Oral   SpO2: 100% 100% 100% 98%  Weight:      Height:        Data Reviewed: X-ray review showing subsegmental atelectasis    Latest Ref Rng & Units 02/17/2024    2:09 AM 02/16/2024    4:41 PM 02/16/2024   12:18 PM  CBC  WBC 4.0 - 10.5 K/uL 7.4     Hemoglobin 13.0 - 17.0 g/dL 8.2  8.6  8.6   Hematocrit 39.0 - 52.0 % 26.1  26.9  27.6   Platelets 150 - 400 K/uL 58          Latest Ref Rng & Units 02/17/2024    2:09 AM 02/16/2024   12:18 PM 02/16/2024    2:10 AM  BMP  Glucose 70 - 99 mg/dL 887  863  833   BUN 8 - 23 mg/dL 21  22  20    Creatinine 0.61 - 1.24 mg/dL 9.14  9.02  9.04   Sodium 135 - 145 mmol/L 145  142  144   Potassium 3.5 - 5.1 mmol/L 3.2  3.7  2.9   Chloride 98 - 111 mmol/L 113  110  110   CO2 22 - 32 mmol/L 21  20  22    Calcium  8.9 - 10.3 mg/dL 9.0  9.2  9.0     Total time spent: 55 minutes  Author: Elvan Sor, MD 02/17/2024 12:27 PM  For on call review [email or web address].  "

## 2024-02-17 NOTE — Progress Notes (Signed)
 [facility] night cross cover note:   Breakthrough agitation while on CIWA protocol for alcohol withdrawal.  As it looks like she versus for encephalopathy/agitation from sources beyond just the alcohol withdrawal, including independent metabolic sources along with documentation of acute hepatic encephalopathy, will try a dose of IV haldol  to further attend to his agitation, will continuing CIWA protocol and continuing workup and management of his acute hepatic encephalopathy, including existing plan regarding lactulose  provision.     Eva Pore, DO Hospitalist

## 2024-02-17 NOTE — Discharge Instructions (Signed)
 [place] [address] [place] [address] [phone] PANTRY 3rd Th: 9a - 12p; 3rd Th: 4p - 7p  [employer] [facility] [address] [place] [address] [phone] PANTRY varies - call agency  [address] [facility] [address] [place] [address] [phone] PANTRY 2nd & 4th Saturday 11am-1pm  [address] [place] [address] [phone] PANTRY by appointment Sa: 10:30a - 11:30a  [facility] [address] [place] [address] [phone] PANTRY Sa: 10a - 12p  on varying weeks; call agency for schedule  Unice Sylvie Falconer [facility] [employer] [address] [place] [address] [phone] PANTRY Tu : 10a - 12p; W: 10a - 12p; Th: 10a - 12p  Robert Greene Robert [facility] [place] [address] [place] [address] [phone] PANTRY 1st & 3rd Tu: 10a - 1p; 3rd Sa: 10a - 12p  [facility] [address] [place] [address] [phone] PANTRY W (By Appointment only - call prior to W to make an appointment)  [facility] [address] [place] [address] [phone] MARINE MASSING M - F: times and dates vary - call agency  [facility] [address] [employer] [place] [address] [phone] PANTRY 1st & 3rd Th: 10a-2p  [facility] [address] [place] [address] [phone] PANTRY 3rd Sat of month: 11a-1p  [facility] [employer] [address] [place] [address] [phone] MARINE MASSING 4th [place]: 11a - 2p  [facility] [place] [address] [place] [address] [phone] PANTRY weekly on W 9a-10:30a  [employer] [facility] [address] [place] [address] [phone] PANTRY 2nd & 4th Th: 1p - 2:30p  [facility] [address] [place] [address] [phone] PANTRY 1st & 3rd F: 9a-12p; 2nd&4th Sa: 9a-12p  [facility] [address] [place] [address] [phone]  PANTRY T-TH 9am-2pm   [facility] [phone] [place] [address] [phone] PANTRY Thursdays 10am-12:30pm  [employer] [facility] [address] [place] [address] [phone] PANTRY 4th Sa: 9a - [address] [facility] [place] [address] [phone] PANTRY 2nd M 5:30p - 7:30p; each W 9:30a - 11:30a  [facility] [place] [address] [place] [address] [phone] PANTRY Tues 11a-1p call for appointment preferred  [facility] [place] [address] [place] [address] [phone] PANTRY mobile distribution sites; call agency for listing  [facility] [address] [place] [address] [phone] PANTRY W: 9a-11a  [facility] [address] [place] [place] [address] [phone] ` T: 12:p -2p  [facility] [address] [place] [address] [phone] PANTRY M: 9:30a - 2:30p; Tu: 9:30a - 2:30p; W: 9:30a - 2:30p; Th: 9:30a - 2:30p  [facility] [address] [place] [address] [phone] PANTRY F: 11a - 2p  [facility] the [facility] [address] [place] [address] [phone] PANTRY mobile distribution sites; call agency for listing  [facility] [address] [place] [address] [phone] PANTRY M & W 6:30p - 8:30p  Pop up on Th 11a - 12:30p at different locations (call)  [facility] [place] [address] [place] [address] [phone] SOUP KITCHEN M & Th: 6p-9p  [facility] [address] [place] [address] [phone], [place] 3rd Thursday 2:30-3:30  [facility] [address] [place] [address]  [phone] PANTRY Tu: 9a - 11a; Th: 9a - 11a  [facility] [address] [place] [address] [phone] PANTRY 1st & 3rd Sa: 9a - 12p  [facility] [address] [address] [phone] PANTRY Tu: 9a - 12p; Th 9a - 12p  [employer] [address] [place] [address] [phone] PANTRY 4th Saturday: 10a - 12p  [facility] [address] [place] [address] [phone] PANTRY W: 9a - 12p  [facility] [address] [employer] [facility] [address] [phone] PANTRY M - F: 11a-3p  [facility] [address] [place] [address] [phone] PANTRY Fridays 11am-2pm  [facility] [address] [place] [address] [phone] PANTRY 3rd Tu: 5p - 7p; 3rd Sa: 9a - 1p  [employer] [facility] [address] [place] [address] [phone] PANTRY 3rd [place]: 9a-12p  [employer] [facility] [address] [place] [address] [phone] PANTRY Sa: 1p - 4p  [facility] [address]  [place]  [address] [phone] PANTRY 3rd Sat 10-12  [facility] - [facility] [address] [place] [address] [phone] PANTRY Th: 10a-11a  [facility] [address] [employer] [phone] PANTRY 1st & 3rd Saturday of each month: 9a - 12p (Please call Monday before to schedule appointment)  [facility] [place] [address] [place] [phone] PANTRY 2nd & 4th Sat of month 10a-2p  [facility] [address] [facility] [address] [phone] PANTRY Tu - Th: 9a -11a and 1p - 4p  [facility] [place] [facility] [address] [place] [address] [phone] PANTRY 3rd Sa: 9a - 10a  [facility] [address] [place] [address] [phone] PANTRY Offers delivery - call in advance  Robert Greene [address] [facility] [phone] PANTRY Please call for locations  Robert Greene [address] [place] [address] [phone] PANTRY by appointment M - F 10a - 4p  [facility] [address] [place] [address] [phone] PANTRY 2nd & 3rd F: 11a - 3p  [facility] [address] [place] [address] [phone] SOUP KITCHEN Breakfast: M - Su 8a  - 9a; Lunch: M - Sa 11a - 12p; Dinner: Robert Greene - Su 6p - 7p  [facility] [address] [place] [address] [phone] PANTRY M - F: 11a - 2p  [facility] [address] [place] [address] [phone] PANTRY Tu: 11a - 3p; Th: 11a - 3p  [address] [employer]    [phone] PANTRY/ SOUP KITCHEN Pantry: Su: 10:30a - 12p; Soup Kitchen: Th-6pm-8pm, Sat-8am-9:30am  [facility] [address] [place] [address] [phone] PANTRY Please call   [facility] [address] [employer] [address] [phone] PANTRY M: 8a - 11:30a + 1p - 2:30p; W: 8a - 11:30a + 1p - 2:30p; F: 8a - 11:30a + 1p - 2:30p  [facility] [address] [place] [address] [phone] PANTRY M - F: 12p - 4p  [facility] [address] [place] [address] [phone] PANTRY 3rd Wed 3p-4p; Seniors & Disabled 3rd Tuesday 11a-12:30p  [facility] [address] [place] [address] [phone] PANTRY W:10:30a-11:30a  [facility] [address] [place] [address] [phone] SOUP KITCHEN 1st & 2nd Sa: 11a-2p  [facility] [address] [place] [address] [phone] PANTRY/ SOUP KITCHEN Pantry: W: 3p - 5p; Soup Kitchen: Th: 5p - 6:30p

## 2024-02-18 ENCOUNTER — Encounter (HOSPITAL_COMMUNITY): Payer: Self-pay | Admitting: Pediatrics

## 2024-02-18 DIAGNOSIS — G9341 Metabolic encephalopathy: Secondary | ICD-10-CM

## 2024-02-18 DIAGNOSIS — D62 Acute posthemorrhagic anemia: Secondary | ICD-10-CM | POA: Diagnosis not present

## 2024-02-18 DIAGNOSIS — E7220 Disorder of urea cycle metabolism, unspecified: Secondary | ICD-10-CM | POA: Diagnosis not present

## 2024-02-18 DIAGNOSIS — K921 Melena: Secondary | ICD-10-CM | POA: Diagnosis not present

## 2024-02-18 DIAGNOSIS — K7030 Alcoholic cirrhosis of liver without ascites: Secondary | ICD-10-CM | POA: Diagnosis not present

## 2024-02-18 DIAGNOSIS — F10931 Alcohol use, unspecified with withdrawal delirium: Secondary | ICD-10-CM

## 2024-02-18 LAB — CBC
HCT: 26.2 % — ABNORMAL LOW (ref 39.0–52.0)
Hemoglobin: 8.2 g/dL — ABNORMAL LOW (ref 13.0–17.0)
MCH: 24 pg — ABNORMAL LOW (ref 26.0–34.0)
MCHC: 31.3 g/dL (ref 30.0–36.0)
MCV: 76.6 fL — ABNORMAL LOW (ref 80.0–100.0)
Platelets: 65 10*3/uL — ABNORMAL LOW (ref 150–400)
RBC: 3.42 MIL/uL — ABNORMAL LOW (ref 4.22–5.81)
RDW: 21.1 % — ABNORMAL HIGH (ref 11.5–15.5)
WBC: 4.8 10*3/uL (ref 4.0–10.5)
nRBC: 0 % (ref 0.0–0.2)

## 2024-02-18 LAB — BASIC METABOLIC PANEL WITH GFR
Anion gap: 15 (ref 5–15)
Anion gap: 12 (ref 5–15)
BUN: 19 mg/dL (ref 8–23)
BUN: 18 mg/dL (ref 8–23)
CO2: 16 mmol/L — ABNORMAL LOW (ref 22–32)
CO2: 22 mmol/L (ref 22–32)
Calcium: 8.7 mg/dL — ABNORMAL LOW (ref 8.9–10.3)
Calcium: 8.6 mg/dL — ABNORMAL LOW (ref 8.9–10.3)
Chloride: 114 mmol/L — ABNORMAL HIGH (ref 98–111)
Chloride: 116 mmol/L — ABNORMAL HIGH (ref 98–111)
Creatinine, Ser: 0.87 mg/dL (ref 0.61–1.24)
Creatinine, Ser: 0.88 mg/dL (ref 0.61–1.24)
GFR, Estimated: >60 mL/min
GFR, Estimated: >60 mL/min
Glucose, Bld: 104 mg/dL — ABNORMAL HIGH (ref 70–99)
Glucose, Bld: 123 mg/dL — ABNORMAL HIGH (ref 70–99)
Potassium: 3.7 mmol/L (ref 3.5–5.1)
Potassium: 3.5 mmol/L (ref 3.5–5.1)
Sodium: 144 mmol/L (ref 135–145)
Sodium: 150 mmol/L — ABNORMAL HIGH (ref 135–145)

## 2024-02-18 LAB — PHOSPHORUS: Phosphorus: 3 mg/dL (ref 2.5–4.6)

## 2024-02-18 LAB — GLUCOSE, CAPILLARY
Glucose-Capillary: 120 mg/dL — ABNORMAL HIGH (ref 70–99)
Glucose-Capillary: 142 mg/dL — ABNORMAL HIGH (ref 70–99)

## 2024-02-18 LAB — MAGNESIUM: Magnesium: 2 mg/dL (ref 1.7–2.4)

## 2024-02-18 MED ORDER — IPRATROPIUM-ALBUTEROL 0.5-2.5 (3) MG/3ML IN SOLN: 3.0000 mL | RESPIRATORY_TRACT | Status: AC | PRN

## 2024-02-18 MED ORDER — PHENOBARBITAL SODIUM 65 MG/ML IJ SOLN
32.5000 mg | Freq: Three times a day (TID) | INTRAMUSCULAR | Status: DC
  Administered 2024-02-22 (×2): 32.5 mg via INTRAVENOUS
  Filled 2024-02-18 (×2): qty 1

## 2024-02-18 MED ORDER — DIAZEPAM 5 MG/ML IJ SOLN: 2.5000 mg | Freq: Every day | INTRAMUSCULAR | Status: DC

## 2024-02-18 MED ORDER — DIAZEPAM 5 MG/ML IJ SOLN: 2.5000 mg | Freq: Four times a day (QID) | INTRAMUSCULAR | Status: DC

## 2024-02-18 MED ORDER — LABETALOL HCL 5 MG/ML IV SOLN: 10.0000 mg | Freq: Four times a day (QID) | INTRAVENOUS | Status: DC | PRN

## 2024-02-18 MED ORDER — HYDRALAZINE HCL 20 MG/ML IJ SOLN
10.0000 mg | INTRAMUSCULAR | Status: AC | PRN
  Administered 2024-02-24 – 2024-02-25 (×2): 10 mg via INTRAVENOUS
  Filled 2024-02-18 (×2): qty 1

## 2024-02-18 MED ORDER — POTASSIUM CHLORIDE 10 MEQ/100ML IV SOLN
10.0000 meq | INTRAVENOUS | Status: AC
  Administered 2024-02-18 (×4): 10 meq via INTRAVENOUS
  Filled 2024-02-18 (×4): qty 100

## 2024-02-18 MED ORDER — DIAZEPAM 5 MG/ML IJ SOLN: 5.0000 mg | Freq: Four times a day (QID) | INTRAMUSCULAR | Status: DC

## 2024-02-18 MED ORDER — SODIUM BICARBONATE 8.4 % IV SOLN
100.0000 meq | Freq: Once | INTRAVENOUS | Status: AC
  Administered 2024-02-18: 100 meq via INTRAVENOUS
  Filled 2024-02-18: qty 50

## 2024-02-18 MED ORDER — LORAZEPAM 2 MG/ML IJ SOLN
1.0000 mg | INTRAMUSCULAR | Status: DC | PRN
  Administered 2024-02-18 – 2024-02-20 (×6): 1 mg via INTRAVENOUS
  Filled 2024-02-18 (×6): qty 1

## 2024-02-18 MED ORDER — DEXTROSE IN LACTATED RINGERS 5 % IV SOLN: INTRAVENOUS | Status: AC

## 2024-02-18 MED ORDER — LABETALOL HCL 5 MG/ML IV SOLN: 5.0000 mg | Freq: Once | INTRAVENOUS | Status: DC

## 2024-02-18 MED ORDER — PHENOBARBITAL SODIUM 130 MG/ML IJ SOLN
97.5000 mg | Freq: Three times a day (TID) | INTRAMUSCULAR | Status: AC
  Administered 2024-02-18 – 2024-02-19 (×6): 97.5 mg via INTRAVENOUS
  Filled 2024-02-18 (×6): qty 1

## 2024-02-18 MED ORDER — NICOTINE 21 MG/24HR TD PT24
21.0000 mg | MEDICATED_PATCH | Freq: Every day | TRANSDERMAL | Status: AC
  Administered 2024-02-18 – 2024-02-29 (×12): 21 mg via TRANSDERMAL
  Filled 2024-02-18 (×12): qty 1

## 2024-02-18 MED ORDER — DEXMEDETOMIDINE HCL IN NACL 400 MCG/100ML IV SOLN
0.0000 ug/kg/h | INTRAVENOUS | Status: DC
  Administered 2024-02-18: 0.4 ug/kg/h via INTRAVENOUS
  Administered 2024-02-19: 0.5 ug/kg/h via INTRAVENOUS
  Administered 2024-02-19: 0.4 ug/kg/h via INTRAVENOUS
  Administered 2024-02-20: 0.7 ug/kg/h via INTRAVENOUS
  Administered 2024-02-20: 0.4 ug/kg/h via INTRAVENOUS
  Administered 2024-02-20 – 2024-02-21 (×2): 0.5 ug/kg/h via INTRAVENOUS
  Filled 2024-02-18 (×8): qty 100

## 2024-02-18 MED ORDER — SODIUM CHLORIDE 0.9 % IV SOLN
200.0000 mg | Freq: Once | INTRAVENOUS | Status: AC
  Administered 2024-02-18: 200 mg via INTRAVENOUS
  Filled 2024-02-18: qty 10

## 2024-02-18 MED ORDER — METOPROLOL TARTRATE 5 MG/5ML IV SOLN
10.0000 mg | Freq: Four times a day (QID) | INTRAVENOUS | Status: AC | PRN
  Administered 2024-02-18 – 2024-02-29 (×3): 10 mg via INTRAVENOUS
  Filled 2024-02-18 (×3): qty 10

## 2024-02-18 MED ORDER — DIAZEPAM 5 MG/ML IJ SOLN
5.0000 mg | Freq: Four times a day (QID) | INTRAMUSCULAR | Status: DC
  Administered 2024-02-18 (×2): 5 mg via INTRAVENOUS
  Filled 2024-02-18 (×2): qty 2

## 2024-02-18 MED ORDER — PHENOBARBITAL SODIUM 65 MG/ML IJ SOLN
65.0000 mg | Freq: Three times a day (TID) | INTRAMUSCULAR | Status: AC
  Administered 2024-02-20 – 2024-02-21 (×6): 65 mg via INTRAVENOUS
  Filled 2024-02-18 (×6): qty 1

## 2024-02-18 MED ORDER — CHLORHEXIDINE GLUCONATE CLOTH 2 % EX PADS
6.0000 | MEDICATED_PAD | Freq: Every day | CUTANEOUS | Status: DC
  Administered 2024-02-18 – 2024-02-22 (×5): 6 via TOPICAL

## 2024-02-18 NOTE — Progress Notes (Addendum)
 "  NAME:  Robert Greene, MRN:  [id number], DOB:  [birth year], LOS: 4 ADMISSION DATE:  02/14/2024, CONSULTATION DATE:  1/26 REFERRING MD:   Dr. Von, CHIEF COMPLAINT:  gib; alcohol withdrawal   History of Present Illness:  Patient is a 64 yo M w/ pertinent PMH alcohol abuse w/ hx of alcohol withdrawal, Liver cirrhosis w/ ascites, ILD, tobacco abuse, htn, hld RA presents to [facility] on 1/22 w/ GIB.   Patient had endoscopy on 11/2023 showing grade 1 varices in lower third esophagus w/ severe portal hypertensive gastropathy. Patient came to [facility] on 1/22 w/ black tarry stools. Per son patient is still drinking. On arrival hgb 8.5 and platelets 57. Fecal occult positive. Alcohol level 141. LFTs mildly elevated and INR wnl. Ammonia 306. Patient given PPI and GI consulted. Started on empiric rocephin . Unable to give rifaximin  while npo. Giving lactulose  enemas. EGD on 1/23 grade II esophageal varices. While in hospital patient with encephalopathy. CIWA protocol in place. On 1/25 ammonia improved to 33.  PCCM reconsulted for agitation/confusion trying to get out of bed   Pertinent  Medical History    Alcoholism (HCC)     Anxiety     Arthritis      rheumatoid   Cirrhosis (HCC)     Coronary artery calcification seen on CAT scan 04/08/2018   Dyspnea     HTN (hypertension) 04/08/2018   Hypercholesteremia     Hypertension     Interstitial lung disease (HCC)     Kidney stones 2020   Pulmonary fibrosis (HCC)     Rheumatoid arthritis (HCC)     Significant Hospital Events: Including procedures, antibiotic start and stop dates in addition to other pertinent events   1/22 admit 1/25 agitation likely from alcohol withdrawal; pccm consulted 1/26: Agitation/confusion from alcohol withdrawal PCCM reconsulted  Interim History / Subjective:  Patient reported to be agitated and confused trying to get out of bed despite being on alcohol withdrawal meds. Patient is on phenobarb, diazepam , and Ativan . At time of  exam patient received diazepam  and Ativan , and was deeply sedated, not following commands or localizing to pain.  He is protecting his airway  Objective    Blood pressure (!) 154/92, pulse 83, temperature 98.8 F (37.1 C), temperature source Oral, resp. rate (!) 24, height 5' 9 (1.753 m), weight 90.7 kg, SpO2 97%.        Intake/Output Summary (Last 24 hours) at 02/18/2024 1443 Last data filed at 02/18/2024 1411 Gross per 24 hour  Intake 1180.76 ml  Output 4200 ml  Net -3019.24 ml   Filed Weights   02/14/24 1457  Weight: 90.7 kg    Examination: General: Laying in bed, not in distress HENT: NCAT, moist mucous membrane Lungs: Symmetrical chest movement, clear to auscultation Cardiovascular: S1-S2 normal, no murmur or regurg Abdomen: Soft nontender nondistended Extremities: Warm, mild edema Neuro: Deeply sedated, not following commands, or localizing to pain GU: Deferred  Resolved problem list   Assessment and Plan   64 year old male with history of alcohol abuse, alcohol withdrawal, liver cirrhosis with ascites, tobacco use, HTN, who presents to [facility] on 1/22 with GI bleed Patient is s/p endoscopy 1/23 grade 2 esophageal varices   Acute metabolic encephalopathy multifactorial due to: - Acute hepatic encephalopathy - Decompensated liver cirrhosis - Hyperammonemia-initial ammonia 306, latest 36/25, on lactulose  - Alcohol withdrawal -Alcohol use disorder Initial alcohol level 141, - Tobacco abuse-nicotine  patch -acute blood loss anemia likely due to GI bleed-improving   Plan - Transferred  to the ICU for close monitoring and management - Airway watch, - Continuous pulse ox and cardiac monitoring - Continue PPI - Continue Rocephin  - Continue rifaximin , and lactulose  will aim for BM> 3 - Continue phenobarb - Diazepam  DC'd, will use Precedex  if needed - Continue thiamine , folic acid  and multivitamin - Will do counseling when mentating well - Monitor and replace  electrolyte as needed   Labs   CBC: Recent Labs  Lab 02/14/24 1514 02/14/24 2018 02/15/24 0619 02/16/24 0210 02/16/24 1218 02/16/24 1641 02/17/24 0209 02/18/24 0505  WBC 4.4  --  4.4 4.3  --   --  7.4 4.8  NEUTROABS  --   --   --  3.2  --   --   --   --   HGB 8.5*   < > 9.7* 8.7* 8.6* 8.6* 8.2* 8.2*  HCT 26.7*   < > 29.3* 26.7* 27.6* 26.9* 26.1* 26.2*  MCV 71.0*  --  72.0* 73.2*  --   --  75.4* 76.6*  PLT 57*  --  45* 47*  --   --  58* 65*   < > = values in this interval not displayed.    Basic Metabolic Panel: Recent Labs  Lab 02/14/24 2018 02/15/24 0619 02/16/24 0210 02/16/24 1218 02/17/24 0209 02/18/24 0022  NA  --  139 144 142 145 144  K  --  3.5 2.9* 3.7 3.2* 3.7  CL  --  104 110 110 113* 114*  CO2  --  23 22 20* 21* 16*  GLUCOSE  --  135* 166* 136* 112* 104*  BUN  --  13 20 22 21 19   CREATININE  --  0.78 0.95 0.97 0.85 0.87  CALCIUM   --  9.0 9.0 9.2 9.0 8.7*  MG 1.7 1.8 2.0  --  2.1 2.0  PHOS  --   --   --  2.4* 2.6 3.0   GFR: Estimated Creatinine Clearance: 96.7 mL/min (by C-G formula based on SCr of 0.87 mg/dL). Recent Labs  Lab 02/15/24 0619 02/16/24 0210 02/17/24 0209 02/18/24 0505  WBC 4.4 4.3 7.4 4.8    Liver Function Tests: Recent Labs  Lab 02/14/24 1514 02/15/24 0619 02/17/24 0209  AST 67* 65* 64*  ALT 26 26 25   ALKPHOS 132* 118 102  BILITOT 1.0 2.3* 1.6*  PROT 6.9 6.7 6.0*  ALBUMIN  3.6 3.5 3.3*   Recent Labs  Lab 02/14/24 1514  LIPASE 86*   Recent Labs  Lab 02/14/24 1514 02/16/24 0210 02/17/24 0209  AMMONIA 306* 49* 33    ABG    Component Value Date/Time   PHART 7.429 12/07/2017 0416   PCO2ART 36.8 12/07/2017 0416   PO2ART 86.2 12/07/2017 0416   HCO3 25.9 01/14/2024 0401   TCO2 25 02/07/2024 1326   ACIDBASEDEF 3.0 (H) 11/09/2023 2049   O2SAT 89.1 01/14/2024 0401     Coagulation Profile: Recent Labs  Lab 02/14/24 1514  INR 1.2    Cardiac Enzymes: No results for input(s): CKTOTAL, CKMB, CKMBINDEX,  TROPONINI in the last 168 hours.  HbA1C: Hgb A1c MFr Bld  Date/Time Value Ref Range Status  01/01/2024 08:19 AM 5.2 4.8 - 5.6 % Final    Comment:    (NOTE) Diagnosis of Diabetes The following HbA1c ranges recommended by the American Diabetes Association (ADA) may be used as an aid in the diagnosis of diabetes mellitus.  Hemoglobin             Suggested A1C NGSP%  Diagnosis  <5.7                   Non Diabetic  5.7-6.4                Pre-Diabetic  >6.4                   Diabetic  <7.0                   Glycemic control for                       adults with diabetes.      CBG: Recent Labs  Lab 02/17/24 2321  GLUCAP 110*    Review of Systems:   Negative except above  Past Medical History:  He,  has a past medical history of Alcoholism (HCC), Anxiety, Arthritis, Cirrhosis (HCC), Coronary artery calcification seen on CAT scan (04/08/2018), Dyspnea, HTN (hypertension) (04/08/2018), Hypercholesteremia, Hypertension, Interstitial lung disease (HCC), Kidney stones (2020), Pulmonary fibrosis (HCC), and Rheumatoid arthritis (HCC).   Surgical History:   Past Surgical History:  Procedure Laterality Date   COLONOSCOPY N/A 08/03/2023   Procedure: COLONOSCOPY;  Surgeon: Mansouraty, Aloha Raddle., MD;  Location: [facility];  Service: Gastroenterology;  Laterality: N/A;   ESOPHAGEAL BANDING  02/15/2024   Procedure: ESOPHAGOSCOPY, WITH ESOPHAGEAL VARICES BAND LIGATION;  Surgeon: Suzann Inocente HERO, MD;  [facility];  Service: Gastroenterology;;   ESOPHAGOGASTRODUODENOSCOPY N/A 08/03/2023   Procedure: EGD (ESOPHAGOGASTRODUODENOSCOPY);  Surgeon: Wilhelmenia Aloha Raddle., MD;  Location: [facility];  Service: Gastroenterology;  Laterality: N/A;   ESOPHAGOGASTRODUODENOSCOPY N/A 09/16/2023   Procedure: EGD (ESOPHAGOGASTRODUODENOSCOPY);  Surgeon: Nandigam, Kavitha V, MD;  Location: [facility];  Service: Gastroenterology;  Laterality: N/A;    ESOPHAGOGASTRODUODENOSCOPY N/A 11/28/2023   Procedure: EGD (ESOPHAGOGASTRODUODENOSCOPY);  Surgeon: Avram Lupita BRAVO, MD;  Location: [facility];  Service: Gastroenterology;  Laterality: N/A;   ESOPHAGOGASTRODUODENOSCOPY N/A 02/15/2024   Procedure: EGD (ESOPHAGOGASTRODUODENOSCOPY);  Surgeon: Suzann Inocente HERO, MD;  Location[facility] END[facility]COPY;  Service: Gastroenterology;  Laterality: N/A;   KNEE CARTILAGE SURGERY Left    x2   LUNG BIOPSY Right 12/06/2017   Procedure: LUNG BIOPSY;  Surgeon: Kerrin Elspeth BROCKS, MD;  Location: [facility] OR;  Service: Thoracic;  Laterality: Right;   VIDEO ASSISTED THORACOSCOPY Right 12/06/2017   Procedure: VIDEO ASSISTED THORACOSCOPY;  Surgeon: Kerrin Elspeth BROCKS, MD;  Location: [facility] OR;  Service: Thoracic;  Laterality: Right;     Social History:   reports that he has been smoking cigarettes. He has a 60 pack-year smoking history. He has never used smokeless tobacco. He reports that he does not currently use alcohol. He reports that he does not use drugs.   Family History:  His family history includes Cirrhosis in his father. There is no history of Colon cancer, Stomach cancer, Esophageal cancer, Inflammatory bowel disease, Liver disease, Pancreatic cancer, or Rectal cancer.   Allergies Allergies[1]   Home Medications  Prior to Admission medications  Medication Sig Start Date End Date Taking? Authorizing Provider  carvedilol  (COREG ) 3.125 MG tablet Take 1 tablet (3.125 mg total) by mouth 2 (two) times daily with a meal. 09/17/23  Yes Rosario Leatrice FERNS, MD  diphenhydrAMINE  (BENADRYL  ALLERGY) 25 MG tablet Take 25 mg by mouth every 6 (six) hours as needed for itching.   Yes [provider]  EPINEPHrine  0.3 mg/0.3 mL IJ SOAJ injection Use once.  If ineffective, use 2nd dose. 11/19/16  Yes Dean Clarity, MD  famotidine  (  PEPCID ) 20 MG tablet Take 1 tablet (20 mg total) by mouth 2 (two) times daily. 01/16/24  Yes Gonfa, Taye T, MD  folic acid  (FOLVITE ) 1 MG tablet  Take 1 tablet (1 mg total) by mouth daily. 01/16/24  Yes Gonfa, Taye T, MD  furosemide  (LASIX ) 20 MG tablet Take 1 tablet (20 mg total) by mouth daily. 01/16/24  Yes Gonfa, Taye T, MD  hydrocortisone cream 1 % Apply 1 Application topically 2 (two) times daily as needed for itching (if out of triamcinolone  cream).   Yes [provider]  lactulose  (CHRONULAC ) 10 GM/15ML solution Take 30 mLs (20 g total) by mouth once (1) to three (3) times daily for a goal of 2-3 bowel movements a day. 01/16/24  Yes Gonfa, Taye T, MD  spironolactone  (ALDACTONE ) 50 MG tablet Take 1 tablet (50 mg total) by mouth daily. 01/16/24  Yes Gonfa, Taye T, MD  Thiamine  HCl (B-1) 100 MG TABS Take 1 tablet (100 mg total) by mouth daily. 01/16/24  Yes Gonfa, Taye T, MD  triamcinolone  cream (KENALOG) 0.1 % Apply 1 Application topically 2 (two) times daily. 12/07/23  Yes [provider]  [Paused] etanercept (ENBREL) 50 MG/ML injection Inject 50 mg into the skin every Saturday. Patient not taking: Reported on 01/14/2024 Wait to take this until your doctor or other care provider tells you to start again.    [provider]       CRITICAL CARE Lenny Drought, MD  [facility] Prefer epic messenger for cross cover needs   My critical care time: 40 minutes  Critical care time was exclusive of separately billable procedures and treating other patients.  Critical care was necessary to treat or prevent imminent or life-threatening deterioration.  Critical care was time spent personally by me on the following activities: development of treatment plan with patient and/or surrogate as well as nursing, discussions with consultants, evaluation of patient's response to treatment, examination of patient, obtaining history from patient or surrogate, ordering and performing treatments and interventions, ordering and review of laboratory studies, ordering and review of radiographic studies,  pulse oximetry, re-evaluation of patient's condition and participation in multidisciplinary rounds.     [1]  Allergies Allergen Reactions   Bee Venom Anaphylaxis, Hives and Rash   Spider Antivenin [S Black Widow (Latrodec Mactans) Antivenin] Anaphylaxis, Hives and Rash   "

## 2024-02-18 NOTE — Progress Notes (Addendum)
 Initial Nutrition Assessment  DOCUMENTATION CODES:   Not applicable  INTERVENTION:  Initiate tube feeding via NGT/Cortrak once enteral access obtained: Osmolite 1.5 at 55 ml/h (1320 ml per day) Initiate at 25ml/hr and increase by 10ml/hr q8h until goal rate achieved  Prosource TF20 60 ml daily Provides 2060 kcal, 103 gm protein, 1006 ml free water daily  Continue to monitor magnesium , potassium, and phosphorus daily for refeeding until labs stabilize, MD to replete as needed  Monitor diet advancement/tolerance and ability to wean nutrition support, if indicated  Continue MVI/thiamine /folic acid  Collect new weight to assess recent trend   NUTRITION DIAGNOSIS:  Inadequate oral intake related to inability to eat (cirrhosis) as evidenced by NPO status.  GOAL:  Patient will meet greater than or equal to 90% of their needs  MONITOR:  PO intake, Supplement acceptance, Diet advancement, Weight trends, Labs, Skin, I & O's  REASON FOR ASSESSMENT:   Consult Enteral/tube feeding initiation and management  ASSESSMENT:   Pt with PMH significant for: HTN, tobacco use, chronic alcoholism, anemia (chronic illness and iron  deficiency), cirrhosis, esophageal varices, and portal gastropathy. Presented with upper GI bleed s/p EGD showing esophageal varices and portal gastropathy and required blood transfusion.  1/23 admitted; EGD: grade 2 esophageal varices, 1 unit PRBC transfused 1/25 PCCM consulted and started phenobarbital  taper + Valium   Patient remains on lactulose  enemas. He remains encephalopathic and on CIWA protocol. Agitated/confused overnight and pulling at lines. EGD on 1/23 showed grade II esophageal varices. Noted difference from grade I varices in lower third of esophagus noted on endoscopy in November of 2025. That same EGD in November also showed severe portal gastropathy submucosal polypoid deformity in the second portion of the duodenum.   Received consult for initiation of  enteral nutrition today. Noted no Cortrak consult entered and Cortrak not in service until Wednesday (1/28) due to inclement weather. Discussed with RN and MD via secure chat that bedside NGT would need to be placed, if enteral access needed immediately. C/f ability to maintain enteral access once placed, as patient documented as agitated and pulling at lines.   RD working remotely and unable to obtain nutrition history as patient is altered. Spoke with patient's son, [name], via telephone to obtain. He notes that patient's appetite/intake is stable and adequate consuming 2-3 meals per day when he is not consuming alcohol. He notes that when patient does consume alcohol, intake declines drastically where he barely consumes anything. He reports that this cycle is ongoing where he will be admitted to the hospital and be mentally altered. When encephalopathy improves, adequate intake resumes. Discussed implications for artificial nutrition support and ability to adequately administer nutrition regimen in the setting of his agitation. Discussed plan of care that was dicussed with RN/MD above.   Admit Weight: 90.7 kg - appears stated; needs new weight collected   Son endorses no significant weight changes in last 6-12 months. Per chart review, weight stable over last six months. Noted admission weight appears stated. Will need new weight to corroborate recent weight trend since admission back in November 2025.   Some electrolyte derangements noted and being repleted. At risk for refeeding and on dextrose . Thiamine  and folic acid  continue.   Drains/Lines: FMS: 1800 ml x24 hours UOP: 2000 ml x24 hours  Meds: diazepam , folic acid , lactulose , MVI, pantoprazole , phenobarbital , thiamine , IV ABX Drips: D5W @50  Venofer  10 mEq KCl x4  Labs:  Na+ 144 (wdl) K+ 2.9--->3.7 (wdl) PHOS 2.4 --->3.0 (wdl) Mg 2.0 (wdl) Iron  24 (L)  Hgb 6.7-->8.2 (L) TSH 0.204 (L) CBGs 104-112 x24 hours A1c 5.2  (12/2023)   NUTRITION - FOCUSED PHYSICAL EXAM: Defer to next in person assessment   Diet Order:   Diet Order             Diet NPO time specified Except for: Ice Chips, Sips with Meds  Diet effective midnight                   EDUCATION NEEDS:  Not appropriate for education at this time  Skin:  Skin Assessment: Reviewed RN Assessment  Last BM:     Height:  Ht Readings from Last 1 Encounters:  02/14/24 5' 9 (1.753 m)   Weight:  Wt Readings from Last 1 Encounters:  02/14/24 90.7 kg    Ideal Body Weight:  72.7 kg  BMI:  Body mass index is 29.53 kg/m.  Estimated Nutritional Needs:   Kcal:  2000-2200 kcals  Protein:  95-110g  Fluid:  >2L/day  Blair Deaner MS, RD, LDN Registered Dietitian I Clinical Nutrition RD Inpatient Contact Info in Amion

## 2024-02-18 NOTE — Progress Notes (Addendum)
 Patient is consistently agitated, has been trying to get out of the bed several times, legs are constantly over the bed rails even with the headstart belt on, pulled out IV x2 this morning even with mittens on. Will sleep for 5-10 minutes at a time.  CIWA's remain 18-19. MD made aware.

## 2024-02-18 NOTE — Progress Notes (Signed)
 Patients daughter, [name], notified of ICU transfer. All questions answered. Daughter understands and will let her brother know.

## 2024-02-18 NOTE — Anesthesia Postprocedure Evaluation (Addendum)
"   Anesthesia Post Note  [name]  Procedure(s) Performed: EGD (ESOPHAGOGASTRODUODENOSCOPY) ESOPHAGOSCOPY, WITH ESOPHAGEAL VARICES BAND LIGATION     Patient location during evaluation: Endoscopy Anesthesia Type: General Level of consciousness: awake and alert Pain management: pain level controlled Vital Signs Assessment: post-procedure vital signs reviewed and stable Respiratory status: spontaneous breathing, nonlabored ventilation, respiratory function stable and patient connected to nasal cannula oxygen Cardiovascular status: stable and blood pressure returned to baseline Postop Assessment: no apparent nausea or vomiting Anesthetic complications: no   No notable events documented.  Last Vitals:  Vitals:   02/18/24 1100 02/18/24 1300  BP: (!) 161/112 (!) 158/82  Pulse: (!) 118 (!) 132  Resp: (!) 26 (!) 31  Temp:    SpO2: 97% 95%    Last Pain:  Vitals:   02/18/24 0700  TempSrc: Oral                 Oluwasemilore Pascuzzi S      "

## 2024-02-18 NOTE — Progress Notes (Signed)
 Pt sleeps for 5 mins on and of, then wakes up, he  tries to get up or pull lines multiple times despite  PRN meds and scheduled valium . Observed Pt still not following commands and still confuse which is not new. Additional safety precautions were placed. Ciwa score still @15 -18. CCM was consulted. Plan of care ongoing.

## 2024-02-18 NOTE — Progress Notes (Signed)
 " Progress Note   [name] FMW:[id number] DOB: 06-24-1960 DOA: 02/14/2024     4 DOS: the patient was seen and examined on 02/18/2024   Brief hospital course: From HPI Robert Greene is a 64 y.o. male with past medical history  of alcoholism, essential hypertension, hyperlipidemia, rheumatoid arthritis with history of Enbrel, liver cirrhosis and history of decompensated liver cirrhosis with ascites portal hypertensive gastropathy history of hepatic encephalopathy alcohol withdrawal, history of C. difficile colitis, pulmonary fibrosis, medical noncompliance, tobacco abuse, coming today for again brought by EMS from home with the last 3 days of feeling nauseated and weak and then this morning he noticed that he had several black tarry stools.  Patient had an upper endoscopy on 11/28/2023 Showed grade 1 varices in lower third of the esophagus severe portal hypertensive gastropathy submucosal polypoid deformity in the second portion of the duodenum, colonoscopy in July 2025 showing hemorrhoids: Showed excessive looping with redundancy, small angioectasia in the proximal ascending colon, multiple sessile polyps in the rectosigmoid sigmoid colon and descending colon small nonbleeding rectal varix normal mucosa no evidence of diverticulosis patient did have ablation of the AVM.     Assessment and plan:  # Alcohol withdrawal symptoms # Chronic alcohol use  Counseled on alcohol cessation  Continue thiamine  1/25 CIWA score 15 in the a.m., patient received Ativan  as needed PCCM was consulted at night on 1/25, started phenobarbital  tapering dose and Valium  tapering dose Continue to monitor and use CIWA protocol We will consider consulting PCCM again if patient needs Precedex  IV infusion  # Acute blood loss anemia likely secondary to GI bleed Patient with a history of liver cirrhosis and history of decompensated liver cirrhosis with ascites portal hypertensive gastropathy  Hemoglobin  noted to be 6.7 upon presentation S/p blood transfusion Continue Protonix  GI consulted, s/p EGD: Grade 2 esophageal varices, incompletely eradicated, banded.  No active bleeding.  Portal hypertensive gastropathy.  GI recommended to continue PPI, no indication for octreotide . Watch for overt GI bleeding and monitor H&H    # Hypokalemia, potassium repleted. # Hypophosphatemia, monitor Monitor electrolytes and replete as needed.  # Metabolic acidosis, CO2 16 on 1/26 Bicarb 100 mEq IV push given x 1 dose Follow repeat BMP and use IV bicarb as needed   # Acute hepatic encephalopathy # Decompensated liver cirrhosis # Thrombocytopenia likely secondary to alcohol and cirrhosis Patient presented with ammonia level of 306 Given significant ammonia levels and unable to give p.o. or insert NG tube given recent banding of esophageal varices today, GI has agreed for lactulose  enema Rifaximin  once patient is able to take p.o.   # Essential hypertension Currently on as needed hydralazine  Avoiding scheduled antihypertensives given GI bleed with normal blood pressure 1/26 hypertensive with tachycardia most likely due to withdrawal symptoms.  Labetalol  5 mg x 1 dose given followed by 10 mg IV as needed   # Hyperlipidemia: resume statin when appropriate  # History of rheumatoid arthritis Outpatient follow-up   DVT prophylaxis: SCD  Consults:  Gastroenterology   Advance Care Planning:    Code Status: Full Code    Family Communication: None at bedside   Subjective:  Patient seen and examined at bedside this morning Still encephalopathic unable to offer any complaints, lying comfortably, without any acute distress. Last night patient had significant agitation so PCCM was consulted, patient was started on phenobarbital  and Valium  tapering doses.    Physical Exam: General: NAD, lying comfortably Eyes: Briefly opened, dosing of ENT: Oral  Mucosa dry Neck: no JVD,  Cardiovascular: S1  and S2 Present, no Murmur,  Respiratory: good air entry b/l, No crackles or wheezes Abdomen: BS present, Soft and no tenderness,  Extremities: no significant pedal edema Neurologic: Obtunded and sedated, unable to offer any complaint    Vitals:   02/18/24 0500 02/18/24 0550 02/18/24 0600 02/18/24 0700  BP: (!) 165/101  135/80 135/73  Pulse: (!) 134 (!) 134 (!) 126 (!) 114  Resp: (!) 26  (!) 21 14  Temp:    98.8 F (37.1 C)  TempSrc:    Oral  SpO2: 97%  95% 96%  Weight:      Height:        Data Reviewed: X-ray review showing subsegmental atelectasis    Latest Ref Rng & Units 02/18/2024    5:05 AM 02/17/2024    2:09 AM 02/16/2024    4:41 PM  CBC  WBC 4.0 - 10.5 K/uL 4.8  7.4    Hemoglobin 13.0 - 17.0 g/dL 8.2  8.2  8.6   Hematocrit 39.0 - 52.0 % 26.2  26.1  26.9   Platelets 150 - 400 K/uL 65  58         Latest Ref Rng & Units 02/18/2024   12:22 AM 02/17/2024    2:09 AM 02/16/2024   12:18 PM  BMP  Glucose 70 - 99 mg/dL 895  887  863   BUN 8 - 23 mg/dL 19  21  22    Creatinine 0.61 - 1.24 mg/dL 9.12  9.14  9.02   Sodium 135 - 145 mmol/L 144  145  142   Potassium 3.5 - 5.1 mmol/L 3.7  3.2  3.7   Chloride 98 - 111 mmol/L 114  113  110   CO2 22 - 32 mmol/L 16  21  20    Calcium  8.9 - 10.3 mg/dL 8.7  9.0  9.2     Total time spent: 55 minutes  Author: Elvan Sor, MD 02/18/2024 12:09 PM  For on call review [email or web address].  "

## 2024-02-19 DIAGNOSIS — I10 Essential (primary) hypertension: Secondary | ICD-10-CM | POA: Diagnosis not present

## 2024-02-19 DIAGNOSIS — F10931 Alcohol use, unspecified with withdrawal delirium: Secondary | ICD-10-CM

## 2024-02-19 DIAGNOSIS — E870 Hyperosmolality and hypernatremia: Secondary | ICD-10-CM

## 2024-02-19 DIAGNOSIS — G9341 Metabolic encephalopathy: Secondary | ICD-10-CM | POA: Diagnosis not present

## 2024-02-19 DIAGNOSIS — K7682 Hepatic encephalopathy: Secondary | ICD-10-CM

## 2024-02-19 DIAGNOSIS — I8500 Esophageal varices without bleeding: Secondary | ICD-10-CM | POA: Diagnosis not present

## 2024-02-19 DIAGNOSIS — K3189 Other diseases of stomach and duodenum: Secondary | ICD-10-CM | POA: Diagnosis not present

## 2024-02-19 DIAGNOSIS — E876 Hypokalemia: Secondary | ICD-10-CM | POA: Diagnosis not present

## 2024-02-19 DIAGNOSIS — K7030 Alcoholic cirrhosis of liver without ascites: Secondary | ICD-10-CM | POA: Diagnosis not present

## 2024-02-19 DIAGNOSIS — K766 Portal hypertension: Secondary | ICD-10-CM | POA: Diagnosis not present

## 2024-02-19 DIAGNOSIS — J849 Interstitial pulmonary disease, unspecified: Secondary | ICD-10-CM | POA: Diagnosis not present

## 2024-02-19 DIAGNOSIS — K7290 Hepatic failure, unspecified without coma: Secondary | ICD-10-CM

## 2024-02-19 DIAGNOSIS — F10930 Alcohol use, unspecified with withdrawal, uncomplicated: Secondary | ICD-10-CM | POA: Diagnosis not present

## 2024-02-19 DIAGNOSIS — K922 Gastrointestinal hemorrhage, unspecified: Secondary | ICD-10-CM | POA: Diagnosis not present

## 2024-02-19 DIAGNOSIS — D649 Anemia, unspecified: Secondary | ICD-10-CM | POA: Diagnosis not present

## 2024-02-19 LAB — BASIC METABOLIC PANEL WITH GFR
Anion gap: 8 (ref 5–15)
BUN: 17 mg/dL (ref 8–23)
CO2: 23 mmol/L (ref 22–32)
Calcium: 8.3 mg/dL — ABNORMAL LOW (ref 8.9–10.3)
Chloride: 118 mmol/L — ABNORMAL HIGH (ref 98–111)
Creatinine, Ser: 0.82 mg/dL (ref 0.61–1.24)
GFR, Estimated: >60 mL/min
Glucose, Bld: 143 mg/dL — ABNORMAL HIGH (ref 70–99)
Potassium: 3.5 mmol/L (ref 3.5–5.1)
Sodium: 149 mmol/L — ABNORMAL HIGH (ref 135–145)

## 2024-02-19 LAB — CBC
HCT: 25 % — ABNORMAL LOW (ref 39.0–52.0)
Hemoglobin: 7.7 g/dL — ABNORMAL LOW (ref 13.0–17.0)
MCH: 24 pg — ABNORMAL LOW (ref 26.0–34.0)
MCHC: 30.8 g/dL (ref 30.0–36.0)
MCV: 77.9 fL — ABNORMAL LOW (ref 80.0–100.0)
Platelets: 58 10*3/uL — ABNORMAL LOW (ref 150–400)
RBC: 3.21 MIL/uL — ABNORMAL LOW (ref 4.22–5.81)
RDW: 22.5 % — ABNORMAL HIGH (ref 11.5–15.5)
WBC: 3.6 10*3/uL — ABNORMAL LOW (ref 4.0–10.5)
nRBC: 0.6 % — ABNORMAL HIGH (ref 0.0–0.2)

## 2024-02-19 LAB — COMPREHENSIVE METABOLIC PANEL WITH GFR
ALT: 27 U/L (ref 0–44)
AST: 48 U/L — ABNORMAL HIGH (ref 15–41)
Albumin: 3.1 g/dL — ABNORMAL LOW (ref 3.5–5.0)
Alkaline Phosphatase: 91 U/L (ref 38–126)
Anion gap: 10 (ref 5–15)
BUN: 18 mg/dL (ref 8–23)
CO2: 23 mmol/L (ref 22–32)
Calcium: 8.5 mg/dL — ABNORMAL LOW (ref 8.9–10.3)
Chloride: 119 mmol/L — ABNORMAL HIGH (ref 98–111)
Creatinine, Ser: 0.91 mg/dL (ref 0.61–1.24)
GFR, Estimated: >60 mL/min
Glucose, Bld: 137 mg/dL — ABNORMAL HIGH (ref 70–99)
Potassium: 3.4 mmol/L — ABNORMAL LOW (ref 3.5–5.1)
Sodium: 152 mmol/L — ABNORMAL HIGH (ref 135–145)
Total Bilirubin: 1.3 mg/dL — ABNORMAL HIGH (ref 0.0–1.2)
Total Protein: 5.6 g/dL — ABNORMAL LOW (ref 6.5–8.1)

## 2024-02-19 LAB — GLUCOSE, CAPILLARY
Glucose-Capillary: 110 mg/dL — ABNORMAL HIGH (ref 70–99)
Glucose-Capillary: 121 mg/dL — ABNORMAL HIGH (ref 70–99)
Glucose-Capillary: 126 mg/dL — ABNORMAL HIGH (ref 70–99)
Glucose-Capillary: 132 mg/dL — ABNORMAL HIGH (ref 70–99)
Glucose-Capillary: 141 mg/dL — ABNORMAL HIGH (ref 70–99)
Glucose-Capillary: 142 mg/dL — ABNORMAL HIGH (ref 70–99)
Glucose-Capillary: 149 mg/dL — ABNORMAL HIGH (ref 70–99)

## 2024-02-19 LAB — MAGNESIUM: Magnesium: 2.2 mg/dL (ref 1.7–2.4)

## 2024-02-19 LAB — AMMONIA: Ammonia: 41 umol/L — ABNORMAL HIGH (ref 9–35)

## 2024-02-19 LAB — PHOSPHORUS: Phosphorus: 4 mg/dL (ref 2.5–4.6)

## 2024-02-19 MED ORDER — DEXTROSE 5 % IV SOLN: INTRAVENOUS | Status: AC

## 2024-02-19 MED ORDER — POTASSIUM CHLORIDE 10 MEQ/100ML IV SOLN
10.0000 meq | INTRAVENOUS | Status: AC
  Administered 2024-02-19 (×2): 10 meq via INTRAVENOUS
  Filled 2024-02-19 (×2): qty 100

## 2024-02-19 NOTE — Progress Notes (Addendum)
 Patient ID: Robert Greene, male   DOB: 08-20-1960, 64 y.o.   MRN: [id number]    Progress Note   Subjective   Day # 5 CC; decompensated EtOH related cirrhosis, EtOH withdrawal, hepatic encephalopathy, anemia, hematochezia  Worsening EtOH withdrawal over the past 48 hours precipitating CCM consultation-now on Precedex  and phenobarbital  taper  EGD/1/23= grade 2 varices not actively bleeding however due to size and red spot over 1 varix he was banded x 4, moderate portal hypertensive gastropathy in the entire stomach, no gastric varices  Labs today WBC 3.6/hemoglobin 7.7/hematocrit 25.0-transfused x 1 Ammonia 41 Sodium 152/potassium 3.4/BUN 18/creatinine 0.91 T. bili 1.3/AST 48  Sleeping soundly on Precedex , did not try to awaken.  Bedside nurse reports patient has been stable, getting lactulose  enemas, small amount of dark stool with last enema   Objective   Vital signs in last 24 hours: Temp:  [97.5 F (36.4 C)-98.4 F (36.9 C)] 97.8 F (36.6 C) (01/27 1113) Pulse Rate:  [56-109] 61 (01/27 1300) Resp:  [14-31] 17 (01/27 1300) BP: (83-158)/(54-116) 107/70 (01/27 1300) SpO2:  [90 %-99 %] 94 % (01/27 1300) Weight:  [91.5 kg] 91.5 kg (01/27 0158) Last BM Date : 02/18/24 General: Older white male, somnolent, sedated Heart:  Regular rate and rhythm; no murmurs Lungs: Respirations even and unlabored, lungs CTA bilaterally Abdomen:  Soft, nontender no significant fluid wave normal bowel sounds. Extremities:  Without edema. Neurologic: Sedated Psych: Sedated  Intake/Output from previous day: 01/26 0701 - 01/27 0700 In: 3062.3 [I.V.:1472.4; IV Piggyback:789.9] Out: 2375 [Urine:1000; Stool:1375] Intake/Output this shift: Total I/O In: 545.4 [I.V.:517.9; IV Piggyback:27.5] Out: -   Lab Results: Recent Labs    02/17/24 0209 02/18/24 0505 02/19/24 0323  WBC 7.4 4.8 3.6*  HGB 8.2* 8.2* 7.7*  HCT 26.1* 26.2* 25.0*  PLT 58* 65* 58*   BMET Recent Labs    02/18/24 1448  02/19/24 0323 02/19/24 0948  NA 150* 152* 149*  K 3.5 3.4* 3.5  CL 116* 119* 118*  CO2 22 23 23   GLUCOSE 123* 137* 143*  BUN 18 18 17   CREATININE 0.88 0.91 0.82  CALCIUM  8.6* 8.5* 8.3*   LFT Recent Labs    02/17/24 0209 02/19/24 0323  PROT 6.0* 5.6*  ALBUMIN  3.3* 3.1*  AST 64* 48*  ALT 25 27  ALKPHOS 102 91  BILITOT 1.6* 1.3*  BILIDIR 0.8*  --   IBILI 0.8  --    PT/INR No results for input(s): LABPROT, INR in the last 72 hours.  Studies/Results: No results found.     Assessment / Plan:    #26 64 year old male with severely decompensated EtOH related cirrhosis, with ongoing EtOH use admitted 6 days ago with acute upper GI bleeding.  MELD 3.0: 11 at 02/16/2024 12:18 PM MELD-Na: 12 at 02/16/2024 12:18 PM Calculated from: Serum Creatinine: 0.97 mg/dL (Using min of 1 mg/dL) at 8/75/7973 87:81 PM Serum Sodium: 142 mmol/L (Using max of 137 mmol/L) at 02/16/2024 12:18 PM Total Bilirubin: 2.3 mg/dL at 8/76/7973  3:80 AM Serum Albumin : 3.5 g/dL at 8/76/7973  3:80 AM INR(ratio): 1.2 at 02/14/2024  3:14 PM Age at listing (hypothetical): 80 years Sex: Male at 02/16/2024 12:18 PM    EGD 02/15/2023 with finding of grade 2 esophageal varices, 1 of which had a red spot overlying at the GE junction, he was banded x 4 excessively and was also noted to have moderate portal gastropathy.  Hospital course complicated by EtOH withdrawal, and hepatic encephalopathy.  He had been on  regular doses of lactulose  and Xifaxan  when taking p.o.'s however worsening EtOH withdrawal symptoms over the past 48 hours with agitation etc. culminating now in transfer to ICU and management with Precedex  and phenobarbital  taper.  He has been hemodynamically stable  #2 persistent hepatic encephalopathy-unable to give p.o.'s at present, continues on lactulose  enemas  Core track to be placed tomorrow for installation of meds and feedings  #3 hypernatremia, hypokalemia-replating potassium, started on IV  fluids this a.m. #4 anemia-acute on chronic secondary to recent acute upper GI bleeding. Transfused 1 unit earlier today for hemoglobin 7.7  Given portal hypertension, would allow hemoglobin to drift to 7 prior to any transfusions  #5 interstitial lung disease/rheumatoid arthritis  Plan; patient is okay for core track placement in setting of grade 2 varices Initiate tube feedings and use for meds once placed.-Will need new order placed for oral lactulose  30 cc 3 times daily, and resume Xifaxan   Patient will need repeat EGD in 1 month-Dr. Mansouraty   Principal Problem:   GIB (gastrointestinal bleeding) Active Problems:   Esophageal varices without bleeding (HCC)   Alcoholic cirrhosis (HCC)   Hyperammonemia   Alcohol withdrawal syndrome, with delirium (HCC)     LOS: 5 days   Amy EsterwoodPA-C  02/19/2024, 1:42 PM      Attending Physician's Attestation   I have taken an interval history, reviewed the chart and examined the patient.   Patien seen in the ICU and is resting in bed on Precedex  being adjusted by CV team.  He is not arousable but is getting his lactulose  enemas.  Okay for core track placement in the setting of his recently banded varices just to try to help increase the chance that he can get oral lactulose  and oral medications is reasonable.  Hopefully core track will be able to be placed tomorrow.  How much of his encephalopathy is alcohol withdrawal at this point versus portosystemic encephalopathy is harder to gauge though he does not have any clonus on physical exam today.  We will continue to follow periodically.  He will need repeat upper endoscopy in the coming weeks for variceal rebanding protocol.  If he is still having a difficult time waking up into tomorrow, we should order an abdominal ultrasound to ensure there is not ascites and rule out any sign of SBP or non neutrocytic bacterascites as another reason for patient's mental status issues.   I agree with the  Advanced Practitioner's note, impression, and recommendations with updates and my documentation as noted above.  The majority of the medical decision making/process, formulation of the impression/plan of action for the patient were performed by me with substantive portion of this encounter (>50% time spent including complete performance of at least one of the key components of MDM, History, and/or Exam).   Aloha Finner, MD [facility]oscopy Office # [phone]

## 2024-02-19 NOTE — Plan of Care (Signed)
  Problem: Clinical Measurements: Goal: Cardiovascular complication will be avoided Outcome: Progressing   Problem: Coping: Goal: Level of anxiety will decrease Outcome: Progressing   Problem: Elimination: Goal: Will not experience complications related to bowel motility Outcome: Progressing Goal: Will not experience complications related to urinary retention Outcome: Progressing   Problem: Pain Managment: Goal: General experience of comfort will improve and/or be controlled Outcome: Progressing   Problem: Safety: Goal: Ability to remain free from injury will improve Outcome: Progressing

## 2024-02-19 NOTE — Progress Notes (Signed)
 Brief Nutrition Note  New consult received for management of enteral feeds as pt now has cortrak order for 1/28. Discussed with resident, pt with hx of varices and prefers cortrak placement versus blind placement of [place] sump. Pt being followed by RD team with last full assessment 1/26 (see note for details). Once tube in place, will enter TF recommendations as previously outlined 1/26.  Vernell Lukes, RD, LDN, CNSC Registered Dietitian II Please reach out via secure chat

## 2024-02-19 NOTE — Progress Notes (Signed)
 "  NAME:  Robert Greene, MRN:  [id number], DOB:  1960/04/15, LOS: 4 ADMISSION DATE:  02/14/2024, CONSULTATION DATE:  1/26 REFERRING MD:   Dr. Von, CHIEF COMPLAINT:  gib; alcohol withdrawal   History of Present Illness:  Patient is a 64 yo M w/ pertinent PMH alcohol abuse w/ hx of alcohol withdrawal, Liver cirrhosis w/ ascites, ILD, tobacco abuse, htn, hld RA presents to [facility] on 1/22 w/ GIB.   Patient had endoscopy on 11/2023 showing grade 1 varices in lower third esophagus w/ severe portal hypertensive gastropathy. Patient came to [facility] on 1/22 w/ black tarry stools. Per son patient is still drinking. On arrival hgb 8.5 and platelets 57. Fecal occult positive. Alcohol level 141. LFTs mildly elevated and INR wnl. Ammonia 306. Patient given PPI and GI consulted. Started on empiric rocephin . Unable to give rifaximin  while npo. Giving lactulose  enemas. EGD on 1/23 grade II esophageal varices. While in hospital patient with encephalopathy. CIWA protocol in place. On 1/25 ammonia improved to 33.  PCCM reconsulted for agitation/confusion trying to get out of bed  Pertinent  Medical History  f alcohol withdrawal, Liver cirrhosis w/ ascites, ILD, tobacco abuse, htn, hld RA presents to [facility] on 1/22 w/ GIB.  Significant Hospital Events: Including procedures, antibiotic start and stop dates in addition to other pertinent events   1/22 admit 1/25 agitation likely from alcohol withdrawal; pccm consulted 1/26: Agitation/confusion from alcohol withdrawal PCCM reconsulted  Interim History / Subjective:  Prior to prece dex initiation, patient with severe agitation, attempting to get out of bed with mittens on. Also, attempting to remove lines.  Overnight with hypernatremia while NPO. Started on D5W.  No overt hematochezia but persistent black, tarry stool.   Objective    Blood pressure 116/67, pulse 76, temperature 98 F (36.7 C), temperature source Oral, resp. rate 17, height 5' 9 (1.753 m), weight 91.5  kg, SpO2 92%.        Intake/Output Summary (Last 24 hours) at 02/19/2024 0756 Last data filed at 02/19/2024 0500 Gross per 24 hour  Intake 2726.31 ml  Output 2375 ml  Net 351.31 ml   Filed Weights   02/14/24 1457 02/19/24 0158  Weight: 90.7 kg 91.5 kg    Examination: General: Male laying in bed, somnolent HENT: clear Lungs: CTAB, no rales Cardiovascular: RRR Abdomen: soft, non tender, non distended Extremities: Warm and dry. No LE edema Neuro: Somnolent. Withdraws from pain. Moving extremities, not to direction GU: fecal management system in place  Resolved problem list   Assessment and Plan   Metabolic encephalopathy Hepatic encephalopathy vs EtOH withdrawal  Ammonia trend: 49 ->33->41 - Lactulose  enemas - Stool output+ - On prece dex and phenobarbital  taper - Rifaximin  when enteral access placed  Decompensated EtOH cirrhosis Portal hypertensive gastropathy and grade I varices lower third esophagus on 11/2023 endoscopy Grade II esophageal varices on 1/23 EGD - s/p Rocephin   Alcohol use disorder and withdrawal CIWA score 6, last recorded. Will need enteral access to transition to ?clonidine per tube. Discussed with Dr. Terrill who agrees to Cortrak placement - Electrolyte replacement - Haldol  - Precedex  - Ativan  PRN - Phenobarbital  taber - Folic acid  and thiamine   Hypernatremia On D5W  - Follow up 10 AM BMP  Anemia Acute on chronic from GI bleed - Hgb stable 7.7 from 8.2 -PPI - S/p Venofer  1x dose  Hypertension Hydralazine  PRN Metoprolol  tartrate PRN x1  ILD RA  Enteral access needed Ok per GI.  - RD and Cortrak order placed  in this patient with esophageal varices   Labs   CBC: Recent Labs  Lab 02/15/24 0619 02/16/24 0210 02/16/24 1218 02/16/24 1641 02/17/24 0209 02/18/24 0505 02/19/24 0323  WBC 4.4 4.3  --   --  7.4 4.8 3.6*  NEUTROABS  --  3.2  --   --   --   --   --   HGB 9.7* 8.7* 8.6* 8.6* 8.2* 8.2* 7.7*  HCT 29.3* 26.7*  27.6* 26.9* 26.1* 26.2* 25.0*  MCV 72.0* 73.2*  --   --  75.4* 76.6* 77.9*  PLT 45* 47*  --   --  58* 65* 58*    Basic Metabolic Panel: Recent Labs  Lab 02/15/24 0619 02/16/24 0210 02/16/24 1218 02/17/24 0209 02/18/24 0022 02/18/24 1448 02/19/24 0323  NA 139 144 142 145 144 150* 152*  K 3.5 2.9* 3.7 3.2* 3.7 3.5 3.4*  CL 104 110 110 113* 114* 116* 119*  CO2 23 22 20* 21* 16* 22 23  GLUCOSE 135* 166* 136* 112* 104* 123* 137*  BUN 13 20 22 21 19 18 18   CREATININE 0.78 0.95 0.97 0.85 0.87 0.88 0.91  CALCIUM  9.0 9.0 9.2 9.0 8.7* 8.6* 8.5*  MG 1.8 2.0  --  2.1 2.0  --  2.2  PHOS  --   --  2.4* 2.6 3.0  --  4.0   GFR: Estimated Creatinine Clearance: 92.8 mL/min (by C-G formula based on SCr of 0.91 mg/dL). Recent Labs  Lab 02/16/24 0210 02/17/24 0209 02/18/24 0505 02/19/24 0323  WBC 4.3 7.4 4.8 3.6*    Liver Function Tests: Recent Labs  Lab 02/14/24 1514 02/15/24 0619 02/17/24 0209 02/19/24 0323  AST 67* 65* 64* 48*  ALT 26 26 25 27   ALKPHOS 132* 118 102 91  BILITOT 1.0 2.3* 1.6* 1.3*  PROT 6.9 6.7 6.0* 5.6*  ALBUMIN  3.6 3.5 3.3* 3.1*   Recent Labs  Lab 02/14/24 1514  LIPASE 86*   Recent Labs  Lab 02/14/24 1514 02/16/24 0210 02/17/24 0209 02/19/24 0323  AMMONIA 306* 49* 33 41*    ABG    Component Value Date/Time   PHART 7.429 12/07/2017 0416   PCO2ART 36.8 12/07/2017 0416   PO2ART 86.2 12/07/2017 0416   HCO3 25.9 01/14/2024 0401   TCO2 25 02/07/2024 1326   ACIDBASEDEF 3.0 (H) 11/09/2023 2049   O2SAT 89.1 01/14/2024 0401     Coagulation Profile: Recent Labs  Lab 02/14/24 1514  INR 1.2    Cardiac Enzymes: No results for input(s): CKTOTAL, CKMB, CKMBINDEX, TROPONINI in the last 168 hours.  HbA1C: Hgb A1c MFr Bld  Date/Time Value Ref Range Status  01/01/2024 08:19 AM 5.2 4.8 - 5.6 % Final    Comment:    (NOTE) Diagnosis of Diabetes The following HbA1c ranges recommended by the American Diabetes Association (ADA) may be used  as an aid in the diagnosis of diabetes mellitus.  Hemoglobin             Suggested A1C NGSP%              Diagnosis  <5.7                   Non Diabetic  5.7-6.4                Pre-Diabetic  >6.4                   Diabetic  <7.0  Glycemic control for                       adults with diabetes.      CBG: Recent Labs  Lab 02/18/24 1541 02/18/24 1931 02/19/24 0015 02/19/24 0353 02/19/24 0746  GLUCAP 120* 142* 142* 126* 141*      Past Medical History:  He,  has a past medical history of Alcoholism (HCC), Anxiety, Arthritis, Cirrhosis (HCC), Coronary artery calcification seen on CAT scan (04/08/2018), Dyspnea, HTN (hypertension) (04/08/2018), Hypercholesteremia, Hypertension, Interstitial lung disease (HCC), Kidney stones (2020), Pulmonary fibrosis (HCC), and Rheumatoid arthritis (HCC).   Surgical History:   Past Surgical History:  Procedure Laterality Date   COLONOSCOPY N/A 08/03/2023   Procedure: COLONOSCOPY;  Surgeon: Mansouraty, Aloha Raddle., MD;  Location: [facility];  Service: Gastroenterology;  Laterality: N/A;   ESOPHAGEAL BANDING  02/15/2024   Procedure: ESOPHAGOSCOPY, WITH ESOPHAGEAL VARICES BAND LIGATION;  Surgeon: Suzann Inocente HERO, MD;  Location[facility] ENDOSCOPY;  Service: Gastroenterology;;   ESOPHAGOGASTRODUODENOSCOPY N/A 08/03/2023   Procedure: EGD (ESOPHAGOGASTRODUODENOSCOPY);  Surgeon: Wilhelmenia Aloha Raddle., MD;  Location: [facility];  Service: Gastroenterology;  Laterality: N/A;   ESOPHAGOGASTRODUODENOSCOPY N/A 09/16/2023   Procedure: EGD (ESOPHAGOGASTRODUODENOSCOPY);  Surgeon: Nandigam, Kavitha V, MD;  Location: [facility];  Service: Gastroenterology;  Laterality: N/A;   ESOPHAGOGASTRODUODENOSCOPY N/A 11/28/2023   Procedure: EGD (ESOPHAGOGASTRODUODENOSCOPY);  Surgeon: Avram Lupita BRAVO, MD;  Location: [facility];  Service: Gastroenterology;  Laterality: N/A;   ESOPHAGOGASTRODUODENOSCOPY N/A 02/15/2024   Procedure: EGD  (ESOPHAGOGASTRODUODENOSCOPY);  Surgeon: Suzann Inocente HERO, MD;  Location: [facility] ENDOSCOPY;  Service: Gastroenterology;  Laterality: N/A;   KNEE CARTILAGE SURGERY Left    x2   LUNG BIOPSY Right 12/06/2017   Procedure: LUNG BIOPSY;  Surgeon: Kerrin Elspeth BROCKS, MD;  Location: [facility] OR;  Service: Thoracic;  Laterality: Right;   VIDEO ASSISTED THORACOSCOPY Right 12/06/2017   Procedure: VIDEO ASSISTED THORACOSCOPY;  Surgeon: Kerrin Elspeth BROCKS, MD;  Location: [facility] OR;  Service: Thoracic;  Laterality: Right;     Social History:   reports that he has been smoking cigarettes. He has a 60 pack-year smoking history. He has never used smokeless tobacco. He reports that he does not currently use alcohol. He reports that he does not use drugs.   Family History:  His family history includes Cirrhosis in his father. There is no history of Colon cancer, Stomach cancer, Esophageal cancer, Inflammatory bowel disease, Liver disease, Pancreatic cancer, or Rectal cancer.   Allergies Allergies[1]   Home Medications  Prior to Admission medications  Medication Sig Start Date End Date Taking? Authorizing Provider  carvedilol  (COREG ) 3.125 MG tablet Take 1 tablet (3.125 mg total) by mouth 2 (two) times daily with a meal. 09/17/23  Yes Rosario Leatrice FERNS, MD  diphenhydrAMINE  (BENADRYL  ALLERGY) 25 MG tablet Take 25 mg by mouth every 6 (six) hours as needed for itching.   Yes [provider]  EPINEPHrine  0.3 mg/0.3 mL IJ SOAJ injection Use once.  If ineffective, use 2nd dose. 11/19/16  Yes Dean Clarity, MD  famotidine  (PEPCID ) 20 MG tablet Take 1 tablet (20 mg total) by mouth 2 (two) times daily. 01/16/24  Yes Gonfa, Taye T, MD  folic acid  (FOLVITE ) 1 MG tablet Take 1 tablet (1 mg total) by mouth daily. 01/16/24  Yes Gonfa, Taye T, MD  furosemide  (LASIX ) 20 MG tablet Take 1 tablet (20 mg total) by mouth daily. 01/16/24  Yes Gonfa, Taye T, MD  hydrocortisone cream 1 % Apply 1 Application topically  2 (two)  times daily as needed for itching (if out of triamcinolone  cream).   Yes [provider]  lactulose  (CHRONULAC ) 10 GM/15ML solution Take 30 mLs (20 g total) by mouth once (1) to three (3) times daily for a goal of 2-3 bowel movements a day. 01/16/24  Yes Gonfa, Taye T, MD  spironolactone  (ALDACTONE ) 50 MG tablet Take 1 tablet (50 mg total) by mouth daily. 01/16/24  Yes Gonfa, Taye T, MD  Thiamine  HCl (B-1) 100 MG TABS Take 1 tablet (100 mg total) by mouth daily. 01/16/24  Yes Gonfa, Taye T, MD  triamcinolone  cream (KENALOG) 0.1 % Apply 1 Application topically 2 (two) times daily. 12/07/23  Yes [provider]  [Paused] etanercept (ENBREL) 50 MG/ML injection Inject 50 mg into the skin every Saturday. Patient not taking: Reported on 01/14/2024 Wait to take this until your doctor or other care provider tells you to start again.    [provider]     Critical care time:               [1]  Allergies Allergen Reactions   Bee Venom Anaphylaxis, Hives and Rash   Spider Antivenin [S Black Widow (Latrodec Mactans) Antivenin] Anaphylaxis, Hives and Rash   "

## 2024-02-19 NOTE — Plan of Care (Signed)
" °  Problem: Clinical Measurements: Goal: Ability to maintain clinical measurements within normal limits will improve Outcome: Progressing Goal: Respiratory complications will improve Outcome: Progressing Goal: Cardiovascular complication will be avoided Outcome: Progressing   Problem: Activity: Goal: Risk for activity intolerance will decrease Outcome: Progressing   Problem: Coping: Goal: Level of anxiety will decrease Outcome: Progressing   Problem: Elimination: Goal: Will not experience complications related to urinary retention Outcome: Progressing   Problem: Pain Managment: Goal: General experience of comfort will improve and/or be controlled Outcome: Progressing   Problem: Skin Integrity: Goal: Risk for impaired skin integrity will decrease Outcome: Progressing   "

## 2024-02-19 NOTE — Progress Notes (Signed)
 eLink Physician-Brief Progress Note Patient Name: Robert Greene DOB: 09-18-1960 MRN: [id number]   Date of Service  02/19/2024  HPI/Events of Note  Notified of hypernatremia with Na at 152, K 3.4, chloride 119, crea 0.91.  Pt is NPO and has peripheral Ivs. He is not getting any IVFs at this time.   Free water deficit to improve Na to 145 is about 2.2L  eICU Interventions  Start on D5W at 75cc/hr.  Replete K - IV potassium chloride  10meq x 2 doses.  Repeat BMP within the day.      Intervention Category Intermediate Interventions: Electrolyte abnormality - evaluation and management  Lurae Hornbrook 02/19/2024, 4:38 AM

## 2024-02-20 DIAGNOSIS — K766 Portal hypertension: Secondary | ICD-10-CM | POA: Diagnosis not present

## 2024-02-20 DIAGNOSIS — G9341 Metabolic encephalopathy: Secondary | ICD-10-CM | POA: Diagnosis not present

## 2024-02-20 DIAGNOSIS — F10930 Alcohol use, unspecified with withdrawal, uncomplicated: Secondary | ICD-10-CM | POA: Diagnosis not present

## 2024-02-20 DIAGNOSIS — K7030 Alcoholic cirrhosis of liver without ascites: Secondary | ICD-10-CM | POA: Diagnosis not present

## 2024-02-20 DIAGNOSIS — E876 Hypokalemia: Secondary | ICD-10-CM

## 2024-02-20 LAB — COMPREHENSIVE METABOLIC PANEL WITH GFR
ALT: 27 U/L (ref 0–44)
AST: 51 U/L — ABNORMAL HIGH (ref 15–41)
Albumin: 3.2 g/dL — ABNORMAL LOW (ref 3.5–5.0)
Alkaline Phosphatase: 102 U/L (ref 38–126)
Anion gap: 11 (ref 5–15)
BUN: 15 mg/dL (ref 8–23)
CO2: 20 mmol/L — ABNORMAL LOW (ref 22–32)
Calcium: 8.8 mg/dL — ABNORMAL LOW (ref 8.9–10.3)
Chloride: 117 mmol/L — ABNORMAL HIGH (ref 98–111)
Creatinine, Ser: 0.9 mg/dL (ref 0.61–1.24)
GFR, Estimated: >60 mL/min
Glucose, Bld: 127 mg/dL — ABNORMAL HIGH (ref 70–99)
Potassium: 3.4 mmol/L — ABNORMAL LOW (ref 3.5–5.1)
Sodium: 148 mmol/L — ABNORMAL HIGH (ref 135–145)
Total Bilirubin: 1.3 mg/dL — ABNORMAL HIGH (ref 0.0–1.2)
Total Protein: 6 g/dL — ABNORMAL LOW (ref 6.5–8.1)

## 2024-02-20 LAB — GLUCOSE, CAPILLARY
Glucose-Capillary: 115 mg/dL — ABNORMAL HIGH (ref 70–99)
Glucose-Capillary: 110 mg/dL — ABNORMAL HIGH (ref 70–99)
Glucose-Capillary: 122 mg/dL — ABNORMAL HIGH (ref 70–99)
Glucose-Capillary: 134 mg/dL — ABNORMAL HIGH (ref 70–99)
Glucose-Capillary: 154 mg/dL — ABNORMAL HIGH (ref 70–99)
Glucose-Capillary: 108 mg/dL — ABNORMAL HIGH (ref 70–99)

## 2024-02-20 LAB — PHOSPHORUS: Phosphorus: 3.2 mg/dL (ref 2.5–4.6)

## 2024-02-20 LAB — CBC
HCT: 27.7 % — ABNORMAL LOW (ref 39.0–52.0)
Hemoglobin: 8.5 g/dL — ABNORMAL LOW (ref 13.0–17.0)
MCH: 23.9 pg — ABNORMAL LOW (ref 26.0–34.0)
MCHC: 30.7 g/dL (ref 30.0–36.0)
MCV: 77.8 fL — ABNORMAL LOW (ref 80.0–100.0)
Platelets: 67 10*3/uL — ABNORMAL LOW (ref 150–400)
RBC: 3.56 MIL/uL — ABNORMAL LOW (ref 4.22–5.81)
RDW: 23.1 % — ABNORMAL HIGH (ref 11.5–15.5)
WBC: 5.9 10*3/uL (ref 4.0–10.5)
nRBC: 0.3 % — ABNORMAL HIGH (ref 0.0–0.2)

## 2024-02-20 LAB — BASIC METABOLIC PANEL WITH GFR
Anion gap: 10 (ref 5–15)
BUN: 15 mg/dL (ref 8–23)
CO2: 20 mmol/L — ABNORMAL LOW (ref 22–32)
Calcium: 8.6 mg/dL — ABNORMAL LOW (ref 8.9–10.3)
Chloride: 117 mmol/L — ABNORMAL HIGH (ref 98–111)
Creatinine, Ser: 0.92 mg/dL (ref 0.61–1.24)
GFR, Estimated: >60 mL/min
Glucose, Bld: 149 mg/dL — ABNORMAL HIGH (ref 70–99)
Potassium: 3.6 mmol/L (ref 3.5–5.1)
Sodium: 146 mmol/L — ABNORMAL HIGH (ref 135–145)

## 2024-02-20 LAB — MAGNESIUM: Magnesium: 2 mg/dL (ref 1.7–2.4)

## 2024-02-20 MED ORDER — POTASSIUM CHLORIDE 10 MEQ/100ML IV SOLN
10.0000 meq | INTRAVENOUS | Status: AC
  Administered 2024-02-20 (×4): 10 meq via INTRAVENOUS
  Filled 2024-02-20 (×4): qty 100

## 2024-02-20 MED ORDER — DEXTROSE 5 % IV SOLN: INTRAVENOUS | Status: AC

## 2024-02-20 MED ORDER — ORAL CARE MOUTH RINSE: 15.0000 mL | OROMUCOSAL | Status: AC | PRN

## 2024-02-20 MED ORDER — RIFAXIMIN 550 MG PO TABS
550.0000 mg | ORAL_TABLET | Freq: Two times a day (BID) | ORAL | Status: AC
  Administered 2024-02-20 – 2024-02-29 (×18): 550 mg
  Filled 2024-02-20 (×18): qty 1

## 2024-02-20 MED ORDER — PHENOBARBITAL SODIUM 65 MG/ML IJ SOLN: 65.0000 mg | INTRAMUSCULAR | Status: DC | PRN

## 2024-02-20 NOTE — Progress Notes (Signed)
 Cortrak Tube Team Note:  Consult received to place a Cortrak feeding tube.   Patient has a history of esophageal varices with recent banding. Discussed with CCM and GI, okay to attempt Cortrak placement.  Cortrak placement attempted but tube continued to meet resistance and not advance past 55cm. Procedure aborted and Cortrak tube removed. Informed MD and RN.  Trude Ned RD, LDN Contact via [employer].

## 2024-02-20 NOTE — TOC CM/SW Note (Signed)
 Left message with son, [name], requesting return call.

## 2024-02-20 NOTE — Progress Notes (Signed)
 eLink Physician-Brief Progress Note Patient Name: Robert Greene DOB: 1960-02-16 MRN: [id number]   Date of Service  02/20/2024  HPI/Events of Note  Potassium 3.4, normal creatinine, etoh abuse. Mag/po4 was normal on 27 th.  eICU Interventions  Potassium 10 meq q1 hr x 4 doses and mag lelevel ordered. NPO status. Has PIV only.     Intervention Category Intermediate Interventions: Electrolyte abnormality - evaluation and management  [name] 02/20/2024, 5:21 AM

## 2024-02-20 NOTE — Progress Notes (Signed)
 "  NAME:  Robert Greene, MRN:  [id number], DOB:  Dec 31, 1960, LOS: 4 ADMISSION DATE:  02/14/2024, CONSULTATION DATE:  1/26 REFERRING MD:   Dr. Von, CHIEF COMPLAINT:  gib; alcohol withdrawal   History of Present Illness:  Patient is a 64 yo M w/ pertinent PMH alcohol abuse w/ hx of alcohol withdrawal, Liver cirrhosis w/ ascites, ILD, tobacco abuse, htn, hld RA presents to [facility] on 1/22 w/ GIB.   Patient had endoscopy on 11/2023 showing grade 1 varices in lower third esophagus w/ severe portal hypertensive gastropathy. Patient came to [facility] on 1/22 w/ black tarry stools. Per son patient is still drinking. On arrival hgb 8.5 and platelets 57. Fecal occult positive. Alcohol level 141. LFTs mildly elevated and INR wnl. Ammonia 306. Patient given PPI and GI consulted. Started on empiric rocephin . Unable to give rifaximin  while npo. Giving lactulose  enemas. EGD on 1/23 grade II esophageal varices. While in hospital patient with encephalopathy. CIWA protocol in place. On 1/25 ammonia improved to 33.  PCCM reconsulted for agitation/confusion trying to get out of bed  Pertinent  Medical History  f alcohol withdrawal, Liver cirrhosis w/ ascites, ILD, tobacco abuse, htn, hld RA presents to [facility] on 1/22 w/ GIB.  Significant Hospital Events: Including procedures, antibiotic start and stop dates in addition to other pertinent events   1/22 admit 1/25 agitation likely from alcohol withdrawal; pccm consulted 1/26: Agitation/confusion from alcohol withdrawal PCCM reconsulted 1/27: continuous precedex  need. Awaiting enteral access  Interim History / Subjective:  Precedex  increased to 0.5 for agitation. Interacting more today but not coherent speech. Low normal blood pressures. High ostomy output.   Objective    Blood pressure (!) 94/59, pulse 72, temperature 98.6 F (37 C), temperature source Oral, resp. rate 20, height 5' 9 (1.753 m), weight 91.5 kg, SpO2 98%.        Intake/Output Summary (Last 24 hours)  at 02/20/2024 0829 Last data filed at 02/20/2024 0744 Gross per 24 hour  Intake 1803.69 ml  Output 3200 ml  Net -1396.31 ml   Filed Weights   02/14/24 1457 02/19/24 0158 02/20/24 0500  Weight: 90.7 kg 91.5 kg 91.5 kg    Examination: General: Male laying in bed, somnolent HENT: clear. Protecting airway Lungs:  CTAB Cardiovascular: RRR Abdomen: soft, non tender Extremities: Warm and dry Neuro: awakens to voice and knows name. 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  - Stool output+ - On precedex  and phenobarbital  taper - Rifaximin  when enteral access placed - Awaiting Cortrak placement  Decompensated EtOH cirrhosis Moderate Portal hypertensive gastropathy  Grade II esophageal varices on 1/23 EGD s/p banding 4x Stable hgb 8.5 today - Transfusion goal >7 - S/p rocephin   Alcohol use disorder and withdrawal Hypokalemia; concern for refeeding syndrome CIWA score 12 overnight. Will need enteral access to transition to ?clonidine per tube. Discussed with Dr. Terrill who agrees to Cortrak placement: ordered and planned for today - Electrolyte replacement - Precedex  - Ativan  PRN x1 overnight - Phenobarbital  taper - Folic acid  and thiamine   Hypernatremia Improved to 148.  - Resume 75 ml/HR D5W - Follow up BMP this PM and if needed, start free water per tube  Anemia Acute on chronic from upper GI bleed -Hgv 8.5 today -PPI - S/p Venofer  1x dose  Hypertension Metoprolol  tartrate PRN  ILD Rheumatoid arthritis  Enteral access needed Ok per GI.  - RD and Cortrak order placed in this patient with esophageal varices   Labs   CBC: Recent Labs  Lab 02/16/24 0210 02/16/24 1218 02/16/24 1641 02/17/24 0209 02/18/24 0505 02/19/24 0323 02/20/24 0344   WBC 4.3  --   --  7.4 4.8 3.6* 5.9  NEUTROABS 3.2  --   --   --   --   --   --   HGB 8.7*   < > 8.6* 8.2* 8.2* 7.7* 8.5*  HCT 26.7*   < > 26.9* 26.1* 26.2* 25.0* 27.7*  MCV 73.2*  --   --  75.4* 76.6* 77.9* 77.8*  PLT 47*  --   --  58* 65* 58* 67*   < > = values in this interval not displayed.    Basic Metabolic Panel: Recent Labs  Lab 02/15/24 0619 02/16/24 0210 02/16/24 1218 02/17/24 9790 02/18/24 0022 02/18/24 1448 02/19/24 0323 02/19/24 0948 02/20/24 0344  NA 139 144 142 145 144 150* 152* 149* 148*  K 3.5 2.9* 3.7 3.2* 3.7 3.5 3.4* 3.5 3.4*  CL 104 110 110 113* 114* 116* 119* 118* 117*  CO2 23 22 20* 21* 16* 22 23 23  20*  GLUCOSE 135* 166* 136* 112* 104* 123* 137* 143* 127*  BUN 13 20 22 21 19 18 18 17 15   CREATININE 0.78 0.95 0.97 0.85 0.87 0.88 0.91 0.82 0.90  CALCIUM  9.0 9.0 9.2 9.0 8.7* 8.6* 8.5* 8.3* 8.8*  MG 1.8 2.0  --  2.1 2.0  --  2.2  --   --   PHOS  --   --  2.4* 2.6 3.0  --  4.0  --   --    GFR: Estimated Creatinine Clearance: 93.9 mL/min (by C-G formula based on SCr of 0.9 mg/dL). Recent Labs  Lab 02/17/24 0209 02/18/24 0505 02/19/24 0323 02/20/24 0344  WBC 7.4 4.8 3.6* 5.9    Liver Function Tests: Recent Labs  Lab 02/14/24 1514 02/15/24 0619 02/17/24 0209 02/19/24 0323 02/20/24 0344  AST 67* 65* 64* 48* 51*  ALT 26 26 25 27 27   ALKPHOS 132* 118 102 91 102  BILITOT 1.0 2.3* 1.6* 1.3* 1.3*  PROT 6.9 6.7 6.0* 5.6* 6.0*  ALBUMIN  3.6 3.5 3.3* 3.1* 3.2*   Recent Labs  Lab 02/14/24 1514  LIPASE 86*   Recent Labs  Lab 02/14/24 1514 02/16/24 0210 02/17/24 0209 02/19/24 0323  AMMONIA 306* 49* 33 41*    ABG    Component Value Date/Time   PHART 7.429 12/07/2017 0416   PCO2ART 36.8 12/07/2017 0416   PO2ART 86.2 12/07/2017 0416   HCO3 25.9 01/14/2024 0401   TCO2 25 02/07/2024 1326   ACIDBASEDEF 3.0 (H) 11/09/2023 2049   O2SAT 89.1 01/14/2024 0401     Coagulation Profile: Recent Labs  Lab 02/14/24 1514  INR 1.2    Cardiac  Enzymes: No results for input(s): CKTOTAL, CKMB, CKMBINDEX, TROPONINI in the last 168 hours.  HbA1C: Hgb A1c MFr Bld  Date/Time Value Ref Range Status  01/01/2024 08:19 AM 5.2 4.8 - 5.6 % Final    Comment:    (NOTE) Diagnosis of Diabetes The following HbA1c ranges recommended by the American Diabetes Association (ADA) may be used as an aid in the diagnosis of diabetes mellitus.  Hemoglobin             Suggested A1C NGSP%  Diagnosis  <5.7                   Non Diabetic  5.7-6.4                Pre-Diabetic  >6.4                   Diabetic  <7.0                   Glycemic control for                       adults with diabetes.      CBG: Recent Labs  Lab 02/19/24 1518 02/19/24 1937 02/19/24 2324 02/20/24 0320 02/20/24 0736  GLUCAP 149* 110* 121* 115* 110*      Past Medical History:  He,  has a past medical history of Alcoholism (HCC), Anxiety, Arthritis, Cirrhosis (HCC), Coronary artery calcification seen on CAT scan (04/08/2018), Dyspnea, HTN (hypertension) (04/08/2018), Hypercholesteremia, Hypertension, Interstitial lung disease (HCC), Kidney stones (2020), Pulmonary fibrosis (HCC), and Rheumatoid arthritis (HCC).   Surgical History:   Past Surgical History:  Procedure Laterality Date   COLONOSCOPY N/A 08/03/2023   Procedure: COLONOSCOPY;  Surgeon: Mansouraty, Aloha Raddle., MD;  Location: [facility];  Service: Gastroenterology;  Laterality: N/A;   ESOPHAGEAL BANDING  02/15/2024   Procedure: ESOPHAGOSCOPY, WITH ESOPHAGEAL VARICES BAND LIGATION;  Surgeon: Suzann Inocente HERO, MD;  Location: [facility] ENDOSCOPY;  Service: Gastroenterology;;   ESOPHAGOGASTRODUODENOSCOPY N/A 08/03/2023   Procedure: EGD (ESOPHAGOGASTRODUODENOSCOPY);  Surgeon: Wilhelmenia Aloha Raddle., MD;  Location: [facility];  Service: Gastroenterology;  Laterality: N/A;   ESOPHAGOGASTRODUODENOSCOPY N/A 09/16/2023   Procedure: EGD (ESOPHAGOGASTRODUODENOSCOPY);  Surgeon: Nandigam, Kavitha V,  MD;  Location: [facility];  Service: Gastroenterology;  Laterality: N/A;   ESOPHAGOGASTRODUODENOSCOPY N/A 11/28/2023   Procedure: EGD (ESOPHAGOGASTRODUODENOSCOPY);  Surgeon: Avram Lupita BRAVO, MD;  Location: [facility];  Service: Gastroenterology;  Laterality: N/A;   ESOPHAGOGASTRODUODENOSCOPY N/A 02/15/2024   Procedure: EGD (ESOPHAGOGASTRODUODENOSCOPY);  Surgeon: Suzann Inocente HERO, MD;  Location: [facility] ENDOSCOPY;  Service: Gastroenterology;  Laterality: N/A;   KNEE CARTILAGE SURGERY Left    x2   LUNG BIOPSY Right 12/06/2017   Procedure: LUNG BIOPSY;  Surgeon: Kerrin Elspeth BROCKS, MD;  Location: [facility] OR;  Service: Thoracic;  Laterality: Right;   VIDEO ASSISTED THORACOSCOPY Right 12/06/2017   Procedure: VIDEO ASSISTED THORACOSCOPY;  Surgeon: Kerrin Elspeth BROCKS, MD;  Location: [facility] OR;  Service: Thoracic;  Laterality: Right;     Social History:   reports that he has been smoking cigarettes. He has a 60 pack-year smoking history. He has never used smokeless tobacco. He reports that he does not currently use alcohol. He reports that he does not use drugs.   Family History:  His family history includes Cirrhosis in his father. There is no history of Colon cancer, Stomach cancer, Esophageal cancer, Inflammatory bowel disease, Liver disease, Pancreatic cancer, or Rectal cancer.   Allergies Allergies[1]   Home Medications  Prior to Admission medications  Medication Sig Start Date End Date Taking? Authorizing Provider  carvedilol  (COREG ) 3.125 MG tablet Take 1 tablet (3.125 mg total) by mouth 2 (two) times daily with a meal. 09/17/23  Yes Rosario Leatrice FERNS, MD  diphenhydrAMINE  (BENADRYL  ALLERGY) 25 MG tablet Take 25 mg by mouth every 6 (six) hours as needed for itching.   Yes [provider]  EPINEPHrine  0.3 mg/0.3 mL IJ SOAJ injection Use once.  If ineffective, use 2nd dose. 11/19/16  Yes  Dean Clarity, MD  famotidine  (PEPCID ) 20 MG tablet Take 1 tablet (20 mg total) by mouth 2 (two)  times daily. 01/16/24  Yes Gonfa, Taye T, MD  folic acid  (FOLVITE ) 1 MG tablet Take 1 tablet (1 mg total) by mouth daily. 01/16/24  Yes Gonfa, Taye T, MD  furosemide  (LASIX ) 20 MG tablet Take 1 tablet (20 mg total) by mouth daily. 01/16/24  Yes Gonfa, Taye T, MD  hydrocortisone cream 1 % Apply 1 Application topically 2 (two) times daily as needed for itching (if out of triamcinolone  cream).   Yes [provider]  lactulose  (CHRONULAC ) 10 GM/15ML solution Take 30 mLs (20 g total) by mouth once (1) to three (3) times daily for a goal of 2-3 bowel movements a day. 01/16/24  Yes Gonfa, Taye T, MD  spironolactone  (ALDACTONE ) 50 MG tablet Take 1 tablet (50 mg total) by mouth daily. 01/16/24  Yes Gonfa, Taye T, MD  Thiamine  HCl (B-1) 100 MG TABS Take 1 tablet (100 mg total) by mouth daily. 01/16/24  Yes Gonfa, Taye T, MD  triamcinolone  cream (KENALOG) 0.1 % Apply 1 Application topically 2 (two) times daily. 12/07/23  Yes [provider]  [Paused] etanercept (ENBREL) 50 MG/ML injection Inject 50 mg into the skin every Saturday. Patient not taking: Reported on 01/14/2024 Wait to take this until your doctor or other care provider tells you to start again.    [provider]     Critical care time:               [1]  Allergies Allergen Reactions   Bee Venom Anaphylaxis, Hives and Rash   Spider Antivenin [S Black Widow (Latrodec Mactans) Antivenin] Anaphylaxis, Hives and Rash   "

## 2024-02-20 NOTE — Plan of Care (Signed)

## 2024-02-21 ENCOUNTER — Other Ambulatory Visit (HOSPITAL_COMMUNITY): Payer: Self-pay

## 2024-02-21 ENCOUNTER — Telehealth (HOSPITAL_COMMUNITY): Payer: Self-pay

## 2024-02-21 DIAGNOSIS — F10939 Alcohol use, unspecified with withdrawal, unspecified: Secondary | ICD-10-CM

## 2024-02-21 DIAGNOSIS — I10 Essential (primary) hypertension: Secondary | ICD-10-CM | POA: Diagnosis not present

## 2024-02-21 DIAGNOSIS — D649 Anemia, unspecified: Secondary | ICD-10-CM | POA: Diagnosis not present

## 2024-02-21 DIAGNOSIS — E876 Hypokalemia: Secondary | ICD-10-CM | POA: Diagnosis not present

## 2024-02-21 DIAGNOSIS — E870 Hyperosmolality and hypernatremia: Secondary | ICD-10-CM | POA: Diagnosis not present

## 2024-02-21 DIAGNOSIS — K7030 Alcoholic cirrhosis of liver without ascites: Secondary | ICD-10-CM | POA: Diagnosis not present

## 2024-02-21 DIAGNOSIS — J849 Interstitial pulmonary disease, unspecified: Secondary | ICD-10-CM | POA: Diagnosis not present

## 2024-02-21 DIAGNOSIS — G9341 Metabolic encephalopathy: Secondary | ICD-10-CM | POA: Diagnosis not present

## 2024-02-21 LAB — COMPREHENSIVE METABOLIC PANEL WITH GFR
ALT: 24 U/L (ref 0–44)
AST: 42 U/L — ABNORMAL HIGH (ref 15–41)
Albumin: 2.9 g/dL — ABNORMAL LOW (ref 3.5–5.0)
Alkaline Phosphatase: 97 U/L (ref 38–126)
Anion gap: 9 (ref 5–15)
BUN: 16 mg/dL (ref 8–23)
CO2: 20 mmol/L — ABNORMAL LOW (ref 22–32)
Calcium: 8.4 mg/dL — ABNORMAL LOW (ref 8.9–10.3)
Chloride: 118 mmol/L — ABNORMAL HIGH (ref 98–111)
Creatinine, Ser: 0.89 mg/dL (ref 0.61–1.24)
GFR, Estimated: >60 mL/min
Glucose, Bld: 120 mg/dL — ABNORMAL HIGH (ref 70–99)
Potassium: 3.4 mmol/L — ABNORMAL LOW (ref 3.5–5.1)
Sodium: 147 mmol/L — ABNORMAL HIGH (ref 135–145)
Total Bilirubin: 1.2 mg/dL (ref 0.0–1.2)
Total Protein: 5.5 g/dL — ABNORMAL LOW (ref 6.5–8.1)

## 2024-02-21 LAB — CBC
HCT: 27.3 % — ABNORMAL LOW (ref 39.0–52.0)
Hemoglobin: 8.2 g/dL — ABNORMAL LOW (ref 13.0–17.0)
MCH: 23.7 pg — ABNORMAL LOW (ref 26.0–34.0)
MCHC: 30 g/dL (ref 30.0–36.0)
MCV: 78.9 fL — ABNORMAL LOW (ref 80.0–100.0)
Platelets: 55 10*3/uL — ABNORMAL LOW (ref 150–400)
RBC: 3.46 MIL/uL — ABNORMAL LOW (ref 4.22–5.81)
RDW: 24 % — ABNORMAL HIGH (ref 11.5–15.5)
WBC: 4.5 10*3/uL (ref 4.0–10.5)
nRBC: 0.4 % — ABNORMAL HIGH (ref 0.0–0.2)

## 2024-02-21 LAB — GLUCOSE, CAPILLARY
Glucose-Capillary: 110 mg/dL — ABNORMAL HIGH (ref 70–99)
Glucose-Capillary: 100 mg/dL — ABNORMAL HIGH (ref 70–99)
Glucose-Capillary: 131 mg/dL — ABNORMAL HIGH (ref 70–99)
Glucose-Capillary: 161 mg/dL — ABNORMAL HIGH (ref 70–99)
Glucose-Capillary: 100 mg/dL — ABNORMAL HIGH (ref 70–99)

## 2024-02-21 MED ORDER — CARVEDILOL 6.25 MG PO TABS
6.2500 mg | ORAL_TABLET | Freq: Two times a day (BID) | ORAL | Status: DC
  Administered 2024-02-21 – 2024-02-27 (×11): 6.25 mg via ORAL
  Filled 2024-02-21 (×12): qty 1

## 2024-02-21 MED ORDER — LORAZEPAM 2 MG/ML IJ SOLN
0.0000 mg | INTRAMUSCULAR | Status: AC
  Administered 2024-02-21 – 2024-02-22 (×2): 2 mg via INTRAVENOUS
  Administered 2024-02-22: 3 mg via INTRAVENOUS
  Administered 2024-02-22 – 2024-02-23 (×2): 2 mg via INTRAVENOUS
  Filled 2024-02-21 (×4): qty 1
  Filled 2024-02-21: qty 2
  Filled 2024-02-21: qty 1

## 2024-02-21 MED ORDER — LORAZEPAM 2 MG/ML IJ SOLN
0.0000 mg | Freq: Three times a day (TID) | INTRAMUSCULAR | Status: AC
  Administered 2024-02-24 (×2): 2 mg via INTRAVENOUS
  Administered 2024-02-25: 1 mg via INTRAVENOUS
  Administered 2024-02-25: 2 mg via INTRAVENOUS
  Filled 2024-02-21 (×5): qty 1

## 2024-02-21 MED ORDER — LORAZEPAM 2 MG/ML IJ SOLN: 2.0000 mg | Freq: Once | INTRAMUSCULAR | Status: DC

## 2024-02-21 MED ORDER — LORAZEPAM 2 MG/ML IJ SOLN
1.0000 mg | INTRAMUSCULAR | Status: AC | PRN
  Administered 2024-02-21: 2 mg via INTRAVENOUS
  Administered 2024-02-22: 3 mg via INTRAVENOUS
  Administered 2024-02-23 – 2024-02-24 (×2): 2 mg via INTRAVENOUS
  Filled 2024-02-21 (×2): qty 1
  Filled 2024-02-21: qty 2

## 2024-02-21 MED ORDER — POTASSIUM CHLORIDE 10 MEQ/100ML IV SOLN
10.0000 meq | INTRAVENOUS | Status: AC
  Administered 2024-02-21 (×4): 10 meq via INTRAVENOUS
  Filled 2024-02-21: qty 100

## 2024-02-21 NOTE — Evaluation (Signed)
 Clinical/Bedside Swallow Evaluation Patient Details  Name: Robert Greene MRN: [id number] Date of Birth: [birth year]  Today's Date: 02/21/2024 Time: SLP Start Time (ACUTE ONLY): 1445 SLP Stop Time (ACUTE ONLY): 1514 SLP Time Calculation (min) (ACUTE ONLY): 29 min  Past Medical History:  Past Medical History:  Diagnosis Date   Alcoholism (HCC)    Anxiety    Arthritis    rheumatoid   Cirrhosis (HCC)    Coronary artery calcification seen on CAT scan 04/08/2018   Dyspnea    HTN (hypertension) 04/08/2018   Hypercholesteremia    Hypertension    Interstitial lung disease (HCC)    Kidney stones 2020   Pulmonary fibrosis (HCC)    Rheumatoid arthritis (HCC)    Past Surgical History:  Past Surgical History:  Procedure Laterality Date   COLONOSCOPY N/A 08/03/2023   Procedure: COLONOSCOPY;  Surgeon: Wilhelmenia Aloha Raddle., MD;  Location: [facility];  Service: Gastroenterology;  Laterality: N/A;   ESOPHAGEAL BANDING  02/15/2024   Procedure: ESOPHAGOSCOPY, WITH ESOPHAGEAL VARICES BAND LIGATION;  Surgeon: Suzann Inocente HERO, MD;  Location: [facility] ENDOSCOPY;  Service: Gastroenterology;;   ESOPHAGOGASTRODUODENOSCOPY N/A 08/03/2023   Procedure: EGD (ESOPHAGOGASTRODUODENOSCOPY);  Surgeon: Wilhelmenia Aloha Raddle., MD;  Location: [facility];  Service: Gastroenterology;  Laterality: N/A;   ESOPHAGOGASTRODUODENOSCOPY N/A 09/16/2023   Procedure: EGD (ESOPHAGOGASTRODUODENOSCOPY);  Surgeon: Nandigam, Kavitha V, MD;  Location: [facility];  Service: Gastroenterology;  Laterality: N/A;   ESOPHAGOGASTRODUODENOSCOPY N/A 11/28/2023   Procedure: EGD (ESOPHAGOGASTRODUODENOSCOPY);  Surgeon: Avram Lupita BRAVO, MD;  Location: [facility];  Service: Gastroenterology;  Laterality: N/A;   ESOPHAGOGASTRODUODENOSCOPY N/A 02/15/2024   Procedure: EGD (ESOPHAGOGASTRODUODENOSCOPY);  Surgeon: Suzann Inocente HERO, MD;  Location: [facility] ENDOSCOPY;  Service: Gastroenterology;  Laterality: N/A;   KNEE CARTILAGE SURGERY Left    x2    LUNG BIOPSY Right 12/06/2017   Procedure: LUNG BIOPSY;  Surgeon: Kerrin Elspeth BROCKS, MD;  Location: [facility] OR;  Service: Thoracic;  Laterality: Right;   VIDEO ASSISTED THORACOSCOPY Right 12/06/2017   Procedure: VIDEO ASSISTED THORACOSCOPY;  Surgeon: Kerrin Elspeth BROCKS, MD;  Location: [facility] OR;  Service: Thoracic;  Laterality: Right;   HPI:  Robert Greene is a 64 y.o.male who presented on 02/14/24 from home to ED secondary to three days of nausea and black tarry stools. In ED, fecal occult was positive and stool was bloody. He had an upper endoscopy 11/28/23 which showed grade 1 varices in lower third of the esophagus severe portal hypertensive gastropathy submucosal polypoid deformity in the second portion of the duodenum. He was admitted with GI bleed. Hospitalization complicated by ETOH withdrawal resulting in agitation and confusion with need for Precedex . EGD completed 02/15/24 which showed grade II esophageal varices in middle and lower third of esophagus and portal hypertensive gastropathy. Full liquids diet placed on 02/20/24 but RN observed patient to exhibit frequent coughing with liquids and SLP swallow evaluation was ordered. PMH: alcohol abuse w/ hx of alcohol withdrawal, Liver cirrhosis w/ ascites, ILD, tobacco abuse, HTN, HLD, RA.    Assessment / Plan / Recommendation  Clinical Impression  SLP recommending advance to Dys 2 (minced) solids, thin liquids and will follow for toleration.  Patient is presenting with clinical s/s of what appears to be a cognitive based oral dysphagia as per this BSE. He did exhibit two instances of cough response which occured during PO consumption of close to 20 ounces of liquids. Swallow initiation appeared timely and no change in patient's voice or vitals. SLP does suspect that cough likely related to secretion management  but unable to complete r/o cough from PO intake. Mastication of solids was mildly delayed and patient did exhibit decreased attention during PO  intake of solids as well.   SLP Visit Diagnosis: Dysphagia, oral phase (R13.11)    Aspiration Risk  Mild aspiration risk    Diet Recommendation Thin liquid;Dysphagia 2 (Fine chop)    Liquid Administration via: Cup;Straw Medication Administration: Other (Comment) (as tolerated) Supervision: Patient able to self feed;Full supervision/cueing for compensatory strategies Compensations: Slow rate;Small sips/bites;Minimize environmental distractions Postural Changes: Seated upright at 90 degrees    Other Recommendations Oral Care Recommendations: Oral care BID     Swallow Evaluation Recommendations     Assistance Recommended at Discharge    Functional Status Assessment Patient has had a recent decline in their functional status and demonstrates the ability to make significant improvements in function in a reasonable and predictable amount of time.  Frequency and Duration min 1 x/week  1 week       Prognosis Prognosis for improved oropharyngeal function: Good Barriers to Reach Goals: Cognitive deficits      Swallow Study   General Date of Onset: 02/21/24 HPI: Robert Greene is a 64 y.o.male who presented on 02/14/24 from home to ED secondary to three days of nausea and black tarry stools. In ED, fecal occult was positive and stool was bloody. He had an upper endoscopy 11/28/23 which showed grade 1 varices in lower third of the esophagus severe portal hypertensive gastropathy submucosal polypoid deformity in the second portion of the duodenum. He was admitted with GI bleed. Hospitalization complicated by ETOH withdrawal resulting in agitation and confusion with need for Precedex . EGD completed 02/15/24 which showed grade II esophageal varices in middle and lower third of esophagus and portal hypertensive gastropathy. Full liquids diet placed on 02/20/24 but RN observed patient to exhibit frequent coughing with liquids and SLP swallow evaluation was ordered. PMH: alcohol abuse w/ hx of alcohol  withdrawal, Liver cirrhosis w/ ascites, ILD, tobacco abuse, HTN, HLD, RA. Type of Study: Bedside Swallow Evaluation Previous Swallow Assessment: none found Diet Prior to this Study: Full liquid diet;Thin liquids (Level 0) Temperature Spikes Noted: No Respiratory Status: Room air;Non-rebreather History of Recent Intubation: Yes Total duration of intubation (days):  (for surgery (EGD)) Date extubated: 02/15/24 Behavior/Cognition: Alert;Cooperative;Pleasant mood;Confused Oral Cavity Assessment: Dry;Dried secretions Oral Care Completed by SLP: Yes Oral Cavity - Dentition: Adequate natural dentition;Missing dentition Vision: Functional for self-feeding Self-Feeding Abilities: Able to feed self Patient Positioning: Upright in bed Baseline Vocal Quality: Normal Volitional Cough: Cognitively unable to elicit Volitional Swallow: Unable to elicit    Oral/Motor/Sensory Function Overall Oral Motor/Sensory Function: Within functional limits   Ice Chips     Thin Liquid Thin Liquid: Impaired Presentation: Straw;Self Fed Pharyngeal  Phase Impairments: Other (comments) Other Comments: two instances of cough observed during PO consumption of close to 20 ounces of thin liquids    Nectar Thick Nectar Thick Liquid: Not tested   Honey Thick     Puree Puree: Not tested   Solid     Solid: Impaired Presentation: Self Fed Oral Phase Functional Implications: Impaired mastication     Norleen IVAR Blase, MA, CCC-SLP Speech Therapy  02/21/2024,4:58 PM

## 2024-02-21 NOTE — Progress Notes (Signed)
 eLink Physician-Brief Progress Note Patient Name: Robert Greene DOB: 1960/01/30 MRN: [id number]   Date of Service  02/21/2024  HPI/Events of Note  Notified of potassium 3.4, CRET 0.89, GFR >60  eICU Interventions  Ordered to initiate electrolyte replacement protocol     Intervention Category Intermediate Interventions: Electrolyte abnormality - evaluation and management  [name] 02/21/2024, 4:43 AM

## 2024-02-21 NOTE — Progress Notes (Signed)
 Patient arrived in the unit, he is alert, agitated , CCMD notified, V/S obtained,  red MEWS documentation done, notified to MD, CIWA score 12, CHG bath given, all needs met, bed alarm on, call bell in reach, Ativan  administered as per the order.

## 2024-02-21 NOTE — Progress Notes (Signed)
 Nutrition Follow-up  DOCUMENTATION CODES:  Not applicable  INTERVENTION:  Monitor SLP evaluation for diet recommendations Will add supplements once diet recommendations have been made If not able to take in PO, recommend attempting cortrak again 1/30  NUTRITION DIAGNOSIS:  Inadequate oral intake related to inability to eat (cirrhosis) as evidenced by NPO status. - remains applicable  GOAL:  Patient will meet greater than or equal to 90% of their needs - progressing  MONITOR:  PO intake, Supplement acceptance, Diet advancement, Weight trends, Labs, Skin, I & O's  REASON FOR ASSESSMENT:  Consult Enteral/tube feeding initiation and management  ASSESSMENT:  Pt with PMH significant for: HTN, tobacco use, chronic alcoholism, anemia (chronic illness and iron  deficiency), cirrhosis, esophageal varices, and portal gastropathy. Presented with upper GI bleed s/p EGD showing esophageal varices and portal gastropathy and required blood transfusion.  1/22 - presented to ED 1/23 - EGD showed grade 2 esophageal varices which were banded  1/26 - transferred to ICU to manage precedex  for withdrawal 1/28 - cortrak attempted, unable to pass into the stomach, full liquids   Pt resting in bed at the time of assessment. Cortrak was attempted yesterday but after resistance was met at the GE junction, procedure was aborted. Has had minimal PO intake this admission because of his procedures and mentation. Still not back to his baseline mental status. Diet order was placed yesterday, but RN worried about swallowing as pt keeps coughing with PO intake. Pt yelling out for liquids and food. SLP to evaluate this afternoon to determine if pt will require swallowing interventions.   If pt with inability to eat safely, would recommend attempting cortrak placement again 1/30.  Admit weight: 90.7 kg  Current weight: 94.3 kg  Overall stable weight hx   Intake/Output Summary (Last 24 hours) at 02/21/2024  1350 Last data filed at 02/21/2024 1300 Gross per 24 hour  Intake 743.89 ml  Output 1150 ml  Net -406.11 ml  Net IO Since Admission: -2,569.63 mL [02/21/24 1350]  Drains/Lines: UOP x 24 hours FMS x 24 hours  Nutritionally Relevant Medications: Scheduled Meds:  folic acid   1 mg Oral Daily   lactulose   300 mL Rectal TID   Or   lactulose   20 g Oral TID   multivitamin with minerals  1 tablet Oral Daily   pantoprazole  IV  40 mg Intravenous Q12H   PHENObarbital   65 mg Intravenous Q8H   rifaximin   550 mg Per Tube BID   thiamine   100 mg Intravenous Daily   Continuous Infusions:  potassium chloride  10 mEq (02/21/24 0931)   PRN Meds: lactulose   Labs Reviewed: Sodium 147, chloride 118 Potassium 3.4 CBG ranges from 100-154 mg/dL over the last 24 hours HgbA1c 5.2%  NUTRITION - FOCUSED PHYSICAL EXAM: Flowsheet Row Most Recent Value  Orbital Region Mild depletion  Upper Arm Region No depletion  Thoracic and Lumbar Region No depletion  Buccal Region No depletion  Temple Region Moderate depletion  Clavicle Bone Region Mild depletion  Clavicle and Acromion Bone Region Mild depletion  Scapular Bone Region Mild depletion  Dorsal Hand Unable to assess  Patellar Region Mild depletion  Anterior Thigh Region Mild depletion  Posterior Calf Region No depletion  Edema (RD Assessment) Mild  Hair Reviewed  Eyes Reviewed  Mouth Reviewed  Skin Reviewed  Nails Unable to assess    Diet Order:   Diet Order             Diet full liquid Room service appropriate?  Yes; Fluid consistency: Thin  Diet effective now                   EDUCATION NEEDS:  Not appropriate for education at this time  Skin:  Skin Assessment: Reviewed RN Assessment  Last BM:  1/28 - type 7 FMS placed 1/24   Height:  Ht Readings from Last 1 Encounters:  02/14/24 5' 9 (1.753 m)    Weight:  Wt Readings from Last 1 Encounters:  02/21/24 94.3 kg    Ideal Body Weight:  72.7 kg  BMI:   Body mass index is 30.7 kg/m.  Estimated Nutritional Needs:  Kcal:  2000-2200 kcals Protein:  95-110g Fluid:  >2L/day    Vernell Lukes, RD, LDN, CNSC Registered Dietitian II Please reach out via secure chat

## 2024-02-21 NOTE — Plan of Care (Signed)
   Problem: Activity: Goal: Risk for activity intolerance will decrease Outcome: Progressing   Problem: Nutrition: Goal: Adequate nutrition will be maintained Outcome: Progressing   Problem: Coping: Goal: Level of anxiety will decrease Outcome: Progressing   Problem: Elimination: Goal: Will not experience complications related to bowel motility Outcome: Progressing

## 2024-02-21 NOTE — Telephone Encounter (Signed)
 Pharmacy Patient Advocate Encounter  Insurance verification completed.    The patient is insured through [place]. Patient has Medicare and is not eligible for a copay card, but may be able to apply for patient assistance or Medicare RX Payment Plan (Patient Must reach out to their plan, if eligible for payment plan), if available.    Ran test claim for Xifaxan  550mg  tablet and the current 30 day co-pay is $1,210.33 ($250 going towards deductible).   This test claim was processed through [facility]- copay amounts may vary at other pharmacies due to pharmacy/plan contracts, or as the patient moves through the different stages of their insurance plan.

## 2024-02-21 NOTE — Progress Notes (Signed)
" ° °  NAME:  Robert Greene, MRN:  [id number], DOB:  [birth year], LOS: 4 ADMISSION DATE:  02/14/2024, CONSULTATION DATE:  1/26 REFERRING MD:   Dr. Von, CHIEF COMPLAINT:  gib; alcohol withdrawal   History of Present Illness:  Patient is a 64 yo M w/ pertinent PMH alcohol abuse w/ hx of alcohol withdrawal, Liver cirrhosis w/ ascites, ILD, tobacco abuse, htn, hld RA presents to [facility] on 1/22 w/ GIB.   Patient had endoscopy on 11/2023 showing grade 1 varices in lower third esophagus w/ severe portal hypertensive gastropathy. Patient came to [facility] on 1/22 w/ black tarry stools. Per son patient is still drinking. On arrival hgb 8.5 and platelets 57. Fecal occult positive. Alcohol level 141. LFTs mildly elevated and INR wnl. Ammonia 306. Patient given PPI and GI consulted. Started on empiric rocephin . Unable to give rifaximin  while npo. Giving lactulose  enemas. EGD on 1/23 grade II esophageal varices. While in hospital patient with encephalopathy. CIWA protocol in place. On 1/25 ammonia improved to 33.  PCCM reconsulted for agitation/confusion trying to get out of bed  Pertinent  Medical History  f alcohol withdrawal, Liver cirrhosis w/ ascites, ILD, tobacco abuse, htn, hld RA presents to [facility] on 1/22 w/ GIB.  Significant Hospital Events: Including procedures, antibiotic start and stop dates in addition to other pertinent events   1/22 admit 1/25 agitation likely from alcohol withdrawal; pccm consulted 1/26: Agitation/confusion from alcohol withdrawal PCCM reconsulted 1/27: continuous precedex  need. Awaiting enteral access  Interim History / Subjective:  More awake today. Failed bedside swallow, SLP to see.  Objective    Blood pressure 110/73, pulse 78, temperature 98.1 F (36.7 C), temperature source Oral, resp. rate 14, height 5' 9 (1.753 m), weight 94.3 kg, SpO2 97%.        Intake/Output Summary (Last 24 hours) at 02/21/2024 1508 Last data filed at 02/21/2024 1300 Gross per 24 hour  Intake  743.89 ml  Output 1150 ml  Net -406.11 ml   Filed Weights   02/19/24 0158 02/20/24 0500 02/21/24 0500  Weight: 91.5 kg 91.5 kg 94.3 kg    Examination: More awake Aox2-3 Follows commands Globally weak Abd soft Trace anasarca  Hgb Stable, plts a little lower  Resolved problem list   Assessment and Plan   Metabolic encephalopathy Hepatic encephalopathy vs EtOH withdrawal  Decompensated EtOH cirrhosis Moderate Portal hypertensive gastropathy  Grade II esophageal varices on 1/23 EGD s/p banding 4x Alcohol use disorder and withdrawal Hypokalemia Hypernatremia Anemia Acute on chronic from upper GI bleed Hypertension ILD Enteral access needed- failed cortrak 1/28  SLP to see then can start lactulose  Hopefully can replete free water enterally as well Phenobarb as ordered Off precedex , should be okay for progressive, appreciate TRH taking back over 02/22/24  Rolan Sharps MD PCCM     "

## 2024-02-22 ENCOUNTER — Inpatient Hospital Stay: Admitting: Internal Medicine

## 2024-02-22 DIAGNOSIS — F10131 Alcohol abuse with withdrawal delirium: Secondary | ICD-10-CM

## 2024-02-22 DIAGNOSIS — K922 Gastrointestinal hemorrhage, unspecified: Secondary | ICD-10-CM | POA: Diagnosis not present

## 2024-02-22 DIAGNOSIS — I8500 Esophageal varices without bleeding: Secondary | ICD-10-CM | POA: Diagnosis not present

## 2024-02-22 DIAGNOSIS — K7030 Alcoholic cirrhosis of liver without ascites: Secondary | ICD-10-CM | POA: Diagnosis not present

## 2024-02-22 DIAGNOSIS — K3189 Other diseases of stomach and duodenum: Secondary | ICD-10-CM | POA: Diagnosis not present

## 2024-02-22 DIAGNOSIS — K921 Melena: Secondary | ICD-10-CM | POA: Diagnosis not present

## 2024-02-22 DIAGNOSIS — D6959 Other secondary thrombocytopenia: Secondary | ICD-10-CM

## 2024-02-22 DIAGNOSIS — R4182 Altered mental status, unspecified: Secondary | ICD-10-CM

## 2024-02-22 DIAGNOSIS — K7682 Hepatic encephalopathy: Secondary | ICD-10-CM | POA: Diagnosis not present

## 2024-02-22 DIAGNOSIS — D649 Anemia, unspecified: Secondary | ICD-10-CM | POA: Diagnosis not present

## 2024-02-22 LAB — GLUCOSE, CAPILLARY
Glucose-Capillary: 103 mg/dL — ABNORMAL HIGH (ref 70–99)
Glucose-Capillary: 108 mg/dL — ABNORMAL HIGH (ref 70–99)
Glucose-Capillary: 112 mg/dL — ABNORMAL HIGH (ref 70–99)
Glucose-Capillary: 84 mg/dL (ref 70–99)
Glucose-Capillary: 87 mg/dL (ref 70–99)
Glucose-Capillary: 98 mg/dL (ref 70–99)
Glucose-Capillary: 98 mg/dL (ref 70–99)

## 2024-02-22 LAB — PHOSPHORUS: Phosphorus: 2.1 mg/dL — ABNORMAL LOW (ref 2.5–4.6)

## 2024-02-22 LAB — MAGNESIUM: Magnesium: 2.1 mg/dL (ref 1.7–2.4)

## 2024-02-22 MED ORDER — PANTOPRAZOLE SODIUM 40 MG PO TBEC
40.0000 mg | DELAYED_RELEASE_TABLET | Freq: Two times a day (BID) | ORAL | Status: DC
  Administered 2024-02-22 – 2024-02-28 (×6): 40 mg via ORAL
  Filled 2024-02-22 (×7): qty 1

## 2024-02-22 MED ORDER — OSMOLITE 1.5 CAL PO LIQD
1000.0000 mL | ORAL | Status: AC
  Administered 2024-02-22 – 2024-02-28 (×6): 1000 mL
  Filled 2024-02-22 (×12): qty 1000

## 2024-02-22 MED ORDER — KCL IN DEXTROSE-NACL 10-5-0.45 MEQ/L-%-% IV SOLN
INTRAVENOUS | Status: AC
  Filled 2024-02-22 (×2): qty 1000

## 2024-02-22 MED ORDER — ADULT MULTIVITAMIN W/MINERALS CH
1.0000 | ORAL_TABLET | Freq: Every day | ORAL | Status: AC
  Administered 2024-02-23 – 2024-02-29 (×7): 1
  Filled 2024-02-22 (×7): qty 1

## 2024-02-22 MED ORDER — ENSURE PLUS HIGH PROTEIN PO LIQD: 237.0000 mL | Freq: Two times a day (BID) | ORAL | Status: DC

## 2024-02-22 MED ORDER — PROSOURCE TF20 ENFIT COMPATIBL EN LIQD
60.0000 mL | Freq: Every day | ENTERAL | Status: AC
  Administered 2024-02-22 – 2024-02-29 (×8): 60 mL
  Filled 2024-02-22 (×8): qty 60

## 2024-02-22 NOTE — Procedures (Signed)
 Cortrak  Person Inserting Tube:  Robert Greene, Robert Greene, RD Tube Type:  Cortrak - 43 inches Tube Size:  10 Tube Location:  Left nare Secured by: Bridle Initial Placement:  Gastric Technique Used to Measure Tube Placement:  Marking at nare/corner of mouth Cortrak Secured At:  75 cm Initial Placement Verification:  Cortrak device (Registered Dieticians Only)  Cortrak Tube Team Note:  Consult received to place a Cortrak feeding tube.   No x-ray is required. RN may begin using tube.   If the tube becomes dislodged please keep the tube and contact the Cortrak team at [email or web address] for replacement.  If after hours and replacement cannot be delayed, place a NG tube and confirm placement with an abdominal x-ray.    Robert Kenning, RD Registered Dietitian  See Amion for more information

## 2024-02-22 NOTE — Progress Notes (Signed)
 SLP Cancellation Note  Patient Details Name: Robert Greene MRN: [id number] DOB: [birth year]   Cancelled treatment:       Reason Eval/Treat Not Completed: Medical issues which prohibited therapy;Patient declined, no reason specified (Pt confused, restless. Declining PO trials. Per RN, took pills whole with water  this AM with cueing and no overt s/sx. May be getting cortrak today. Concern for pt's ability to meet nutrional needs orally. Will continue efforts as appropriate.)  Delon Bangs, M.S., CCC-SLP Speech-Language Pathologist Secure Chat Preferred  O: [phone]  Delon CHRISTELLA Bangs 02/22/2024, 9:29 AM

## 2024-02-22 NOTE — Progress Notes (Signed)
 Nutrition Brief Note  Patient has remained on Cortrak list d/t necessity for supplemental nutrition support. SLP recommended initiation of dysphagia 2, thin liquids yesterday as pt expressing clinical s/s of a cognitive based oral dysphagia.  Spoke with RN and MD regarding pt's mental status. RN reports that per night shift report, pt can and will eat though is limited d/t encephalopathy, tremors and coordination. He is very drowsy and lethargic today. It is unlikely pt will be able to consume enough oral intake to sustain nutritional status at this time. Plan to continue with Cortrak placement to support nutrition needs until mental status improves and pt better able to meet oral nutrition needs.   Recommended Nutrition Interventions: Intiate tube feeds via Cortrak: Start Osmolite 1.5 at 15ml and advance by 10ml q8h to goal rate of 67ml/hr ( per day) 60ml ProSource TF20 once daily TF at goal provides: 2060 kcal, 103g protein and per day Once hypernatremia improved with IVF, recommend addition of free water  flushes if remains with enteral access Continue thiamine , folvite  and MVI in setting of EtOH use Monitor for improvement in mental status and ability to improve oral nutrition intake and ability to remove Cortrak   Royce Maris, RDN, LDN Clinical Nutrition See AMiON for contact information.

## 2024-02-22 NOTE — Care Management Important Message (Signed)
 Important Message  Patient Details  Name: Robert Greene MRN: [id number] Date of Birth: Apr 10, 1960   Important Message Given:  Yes - Medicare IM     [name] 02/22/2024, 11:16 AM

## 2024-02-22 NOTE — Progress Notes (Addendum)
 Patient ID: Robert Greene, male   DOB: 1960/03/11, 64 y.o.   MRN: [id number]    Progress Note   Subjective   Day # 8 CC; decompensated EtOH related cirrhosis, EtOH withdrawal, hepatic encephalopathy, anemia/hematochezia Severe EtOH withdrawal, requiring Precedex  drip and phenobarb taper weaned off both yesterday and transferred to floor  Cortrak being placed today, dysphagia 3 diet as tolerates Flexi-Seal in place liquid greenish stool  Lactulose  20 g 3 times daily Xifaxan  550 twice daily Continues on CIWA protocol   EGD 02/15/2024 grade 2 varices middle third of esophagus and lower third not actively bleeding, however due to size and red spot over 1 varix he was banded x 4, also with moderate portal gastropathy in the entire stomach, no gastric varices  Labs pending today 1/29 WBC 4.5/hemoglobin 8.2/hematocrit 27.2 stable/platelets 55 Sodium 147/potassium 3.4/BUN 16/creatinine 0.89 LFTs within normal limits with exception of AST of 42  Patient is awake, speech somewhat garbled unable to answer any direct questions     Objective   Vital signs in last 24 hours: Temp:  [97.9 F (36.6 C)-99.4 F (37.4 C)] 99.1 F (37.3 C) (01/30 0747) Pulse Rate:  [78-121] 94 (01/30 1100) Resp:  [14-33] 19 (01/30 1100) BP: (83-171)/(62-93) 138/76 (01/30 1100) SpO2:  [96 %-100 %] 96 % (01/30 1100) Weight:  [95.3 kg] 95.3 kg (01/30 0327) Last BM Date : 02/21/24 General:   Older white male in NAD awake, speech somewhat garbled, unable to participate in conversation Heart:  Regular rate and rhythm; no murmurs Lungs: Respirations even and unlabored, lungs CTA bilaterally Abdomen:  Soft, nontender and nontense, normal bowel sounds. Extremities:  Without edema. Neurologic:  Alert not following direct commands, unable to demonstrate any asterixis but patient unable to participate Psych: Alert  Intake/Output from previous day: 01/29 0701 - 01/30 0700 In: 688.8 [P.O.:240; I.V.:48.8; IV  Piggyback:400] Out: 400 [Urine:400] Intake/Output this shift: No intake/output data recorded.  Lab Results: Recent Labs    02/20/24 0344 02/21/24 0248  WBC 5.9 4.5  HGB 8.5* 8.2*  HCT 27.7* 27.3*  PLT 67* 55*   BMET Recent Labs    02/20/24 0344 02/20/24 1449 02/21/24 0248  NA 148* 146* 147*  K 3.4* 3.6 3.4*  CL 117* 117* 118*  CO2 20* 20* 20*  GLUCOSE 127* 149* 120*  BUN 15 15 16   CREATININE 0.90 0.92 0.89  CALCIUM  8.8* 8.6* 8.4*   LFT Recent Labs    02/21/24 0248  PROT 5.5*  ALBUMIN  2.9*  AST 42*  ALT 24  ALKPHOS 97  BILITOT 1.2   PT/INR No results for input(s): LABPROT, INR in the last 72 hours.  Studies/Results: No results found.     Assessment / Plan:    #16 64 year old white male with history of EtOH abuse, alcohol-induced decompensated cirrhosis, admitted on 02/14/2024 with upper GI bleeding.  EGD revealed grade 2 esophageal varices in the lower third of the esophagus, he was banded x 4 though not actively bleeding and noted to have severe portal gastropathy as well.  No active bleeding over the past several days.  MELD-Na=12 1/24  #2 severe alcohol withdrawal requiring ICU care, Precedex  and phenobarbital .  Tapered off yesterday and transferred to floor  Remains on CIWA protocol  Patient is alert but unable to participate in conversation, speech garbled not following direct commands  #3 persistent altered mental status-secondary to hepatic encephalopathy and prolonged severe EtOH withdrawal  #4 anemia-stable #5 thrombocytopenia secondary to cirrhosis stable #6 malnutrition/poor oral intake-plate clean  today had been fed dysphagia diet, core track placed today tube feedings not started as yet  #7 interstitial lung disease  Plan; continue lactulose  20 g 3 times daily Continue Xifaxan  550 twice daily Continue CIWA protocol Tube feedings to start today He may require several more days for significant improvement in mental  status  Will need repeat EGD in 4 weeks GI will continue to follow along will follow up Sunday or Monday    Principal Problem:   GIB (gastrointestinal bleeding) Active Problems:   Esophageal varices without bleeding (HCC)   Alcoholic cirrhosis (HCC)   Hyperammonemia   Alcohol withdrawal syndrome, with delirium (HCC)     LOS: 8 days   Amy EsterwoodPA-C  02/22/2024, 1:14 PM     Attending Physician's Attestation   I have taken an interval history, reviewed the chart and examined the patient.   We need to try to minimize as you all are doing his CIWA protocol and Alcohol withdrawal protocol.  It is not clear that PSE is playing a role with his current encephalopathy.  From a GI perspective, no further issues or concerns of anemia.  I am going to have my team work on trying to arrange followup EGD at end of February or beginning of March for variceal surveillance.  Otherwise, he should be continued on Xifaxin as outpatient and Lactulose  to titrate 3-4 Bms per days.    Inpatient GI will signoff at this time, if issues of acute GI bleeding occur or other questions, please let us  know.  I agree with the Advanced Practitioner's note, impression, and recommendations with updates and my documentation as noted above.  The majority of the medical decision making/process, formulation of the impression/plan of action for the patient were performed by me with substantive portion of this encounter (>50% time spent including complete performance of at least one of the key components of MDM, History, and/or Exam).   Aloha Finner, MD [facility] Office # [phone]

## 2024-02-23 DIAGNOSIS — K921 Melena: Secondary | ICD-10-CM | POA: Diagnosis not present

## 2024-02-23 LAB — CBC WITH DIFFERENTIAL/PLATELET
Abs Immature Granulocytes: 0.03 10*3/uL (ref 0.00–0.07)
Basophils Absolute: 0.1 10*3/uL (ref 0.0–0.1)
Basophils Relative: 1 %
Eosinophils Absolute: 0.2 10*3/uL (ref 0.0–0.5)
Eosinophils Relative: 3 %
HCT: 28.1 % — ABNORMAL LOW (ref 39.0–52.0)
Hemoglobin: 8.7 g/dL — ABNORMAL LOW (ref 13.0–17.0)
Immature Granulocytes: 1 %
Lymphocytes Relative: 20 %
Lymphs Abs: 1.2 10*3/uL (ref 0.7–4.0)
MCH: 24.1 pg — ABNORMAL LOW (ref 26.0–34.0)
MCHC: 31 g/dL (ref 30.0–36.0)
MCV: 77.8 fL — ABNORMAL LOW (ref 80.0–100.0)
Monocytes Absolute: 0.9 10*3/uL (ref 0.1–1.0)
Monocytes Relative: 16 %
Neutro Abs: 3.6 10*3/uL (ref 1.7–7.7)
Neutrophils Relative %: 59 %
Platelets: 67 10*3/uL — ABNORMAL LOW (ref 150–400)
RBC: 3.61 MIL/uL — ABNORMAL LOW (ref 4.22–5.81)
RDW: 23.7 % — ABNORMAL HIGH (ref 11.5–15.5)
WBC: 6 10*3/uL (ref 4.0–10.5)
nRBC: 0 % (ref 0.0–0.2)

## 2024-02-23 LAB — MAGNESIUM: Magnesium: 2.1 mg/dL (ref 1.7–2.4)

## 2024-02-23 LAB — GLUCOSE, CAPILLARY
Glucose-Capillary: 138 mg/dL — ABNORMAL HIGH (ref 70–99)
Glucose-Capillary: 155 mg/dL — ABNORMAL HIGH (ref 70–99)
Glucose-Capillary: 99 mg/dL (ref 70–99)
Glucose-Capillary: 116 mg/dL — ABNORMAL HIGH (ref 70–99)
Glucose-Capillary: 131 mg/dL — ABNORMAL HIGH (ref 70–99)

## 2024-02-23 LAB — BASIC METABOLIC PANEL WITH GFR
Anion gap: 11 (ref 5–15)
BUN: 12 mg/dL (ref 8–23)
CO2: 18 mmol/L — ABNORMAL LOW (ref 22–32)
Calcium: 7.9 mg/dL — ABNORMAL LOW (ref 8.9–10.3)
Chloride: 110 mmol/L (ref 98–111)
Creatinine, Ser: 0.64 mg/dL (ref 0.61–1.24)
GFR, Estimated: >60 mL/min
Glucose, Bld: 135 mg/dL — ABNORMAL HIGH (ref 70–99)
Potassium: 3.6 mmol/L (ref 3.5–5.1)
Sodium: 138 mmol/L (ref 135–145)

## 2024-02-23 LAB — PHOSPHORUS: Phosphorus: 2 mg/dL — ABNORMAL LOW (ref 2.5–4.6)

## 2024-02-23 MED ORDER — SODIUM PHOSPHATES 45 MMOLE/15ML IV SOLN
15.0000 mmol | Freq: Once | INTRAVENOUS | Status: AC
  Administered 2024-02-23: 15 mmol via INTRAVENOUS
  Filled 2024-02-23 (×3): qty 5

## 2024-02-23 NOTE — Progress Notes (Addendum)
 " PROGRESS NOTE    Robert Greene  FMW:[id number] DOB: [birth year] DOA: 02/14/2024 PCP: [name], MD   Brief Narrative:    64 y.o. male with past medical history  of alcoholism, essential hypertension, hyperlipidemia, rheumatoid arthritis with history of Enbrel, liver cirrhosis and history of decompensated liver cirrhosis with ascites portal hypertensive gastropathy history of hepatic encephalopathy alcohol withdrawal, history of C. difficile colitis, pulmonary fibrosis, medical noncompliance, tobacco abuse,  brought by EMS from home with multiple complaints including weakness, black tarry stools in the setting of alcohol abuse.  S/p EGD done on 02/15/24-Grade II varices in middle third of esophagus and in lower third, not actively bleeding, s/p variceal banding x 4. Moderate portal hypertensive gastropathy.  PCCM consulted for severe alcohol withdrawal symptoms needing precedex  drip and phenobarbital  taper.  TRH assumed care on 1/30  1/30: Cortrack placed because of poor oral intake in the setting of alcohol withdrawal/encephalopathy. On Tube feeds with osmolite.    Assessment & Plan:  Principal Problem:   GIB (gastrointestinal bleeding) Active Problems:   Esophageal varices without bleeding (HCC)   Alcoholic cirrhosis (HCC)   Hyperammonemia   Alcohol withdrawal syndrome, with delirium (HCC)    Alcohol withdrawal symptoms Chronic alcohol use  Counseled on alcohol cessation  Continue thiamine  and folate Transferred to PCCM for phenobarbital  taper and precedex  infusions TRH assumed care on 1/30 Dced phenobarbital  on 1/30 as this was making him too drowsy and confused Off of precedex  Continue CIWA protocol.    Acute blood loss anemia likely secondary to GI bleed Patient with a history of liver cirrhosis and history of decompensated liver cirrhosis with ascites portal hypertensive gastropathy  Hemoglobin noted to be 6.7 upon presentation S/p blood transfusion Continue  Protonix  GI consulted, s/p EGD 02/15/24: Grade 2 esophageal varices, incompletely eradicated, banded.  No active bleeding.  Portal hypertensive gastropathy.  GI recommended to continue PPI, no indication for octreotide . Needs repeat EGD in 4 weeks.     Hypokalemia, replete as needed Hypophosphatemia, ordered repletion  Hypernatremia: Secondary to dehydration and poor oral intake. Ordered D5 1/2 NS with 10 meq KCL at 75 cc/hr. F/u BMP in am.   Metabolic acidosis, monitor BMP closely     Acute hepatic encephalopathy Decompensated liver cirrhosis Thrombocytopenia likely secondary to alcohol and cirrhosis Continue with lactulose  and rifaximin . GI On board     Essential hypertension Coreg  6.25 mg PO BID    Hyperlipidemia: resume statin when appropriate   History of rheumatoid arthritis Outpatient follow-up  Nutrition: Cortrak placed on 1/30. RD on board. On osmolite 1.2   DVT prophylaxis: SCDs Start: 02/14/24 1902     Code Status: Full Code Family Communication:  None at the bedside Status is: Inpatient Remains inpatient appropriate because: acute encephalopathy, poor oral intake, alcohol withdrawal    Subjective:  Appears confused and drowsy, unable to engage in a meaningful conversation. Left nostril cortrak in place. He is on osmolite 1.2 at 25 cc/hr. Vitals are stable.  Examination:  General exam: confused and disoriented, unable to engage in a meaningful conversation. Opens eyes to sternal rub and then mumbles incomprehensibly Respiratory system: Clear to auscultation. Respiratory effort normal. Cardiovascular system: S1 & S2 heard, RRR. No JVD, murmurs, rubs, gallops or clicks. No pedal edema. Gastrointestinal system: Abdomen is nondistended, soft and nontender. No organomegaly or masses felt. Normal bowel sounds heard. Central nervous system: confused and disoriented Extremities: No obvious abnormalities Skin: No rashes, lesions or ulcers Psychiatry: confused  and disoriented  Diet Orders (From admission, onward)     Start     Ordered   02/22/24 0924  DIET DYS 2 Room service appropriate? No; Fluid consistency: Thin  Diet effective now       Comments: Meds as tolerated (whole or crushed in puree)  Question Answer Comment  Room service appropriate? No   Fluid consistency: Thin      02/22/24 0923            Objective: Vitals:   02/23/24 0400 02/23/24 0405 02/23/24 0728 02/23/24 0800  BP: 136/68  121/75 121/84  Pulse: 85  85 81  Resp: 20  20   Temp: 97.9 F (36.6 C)  98.3 F (36.8 C)   TempSrc: Oral  Oral   SpO2: 98%  100% 100%  Weight:  93.5 kg    Height:        Intake/Output Summary (Last 24 hours) at 02/23/2024 0935 Last data filed at 02/23/2024 9271 Gross per 24 hour  Intake 1717.72 ml  Output 1400 ml  Net 317.72 ml   Filed Weights   02/21/24 0500 02/22/24 0327 02/23/24 0405  Weight: 94.3 kg 95.3 kg 93.5 kg    Scheduled Meds:  carvedilol   6.25 mg Oral BID WC   feeding supplement  237 mL Oral BID WC   feeding supplement (PROSource TF20)  60 mL Per Tube Daily   folic acid   1 mg Oral Daily   lactulose   20 g Oral TID   LORazepam   0-4 mg Intravenous Q4H   Followed by   LORazepam   0-4 mg Intravenous Q8H   multivitamin with minerals  1 tablet Per Tube Daily   nicotine   21 mg Transdermal Daily   pantoprazole   40 mg Oral BID   rifaximin   550 mg Per Tube BID   thiamine   100 mg Oral Daily   Continuous Infusions:  dextrose  5 % and 0.45 % NaCl with KCl 10 mEq/L 75 mL/hr at 02/23/24 0005   feeding supplement (OSMOLITE 1.5 CAL) 25 mL/hr at 02/23/24 0008   sodium PHOSPHATE  IVPB (in mmol)      Nutritional status Signs/Symptoms: NPO status Interventions: Refer to RD note for recommendations Body mass index is 30.44 kg/m.  Data Reviewed:   CBC: Recent Labs  Lab 02/18/24 0505 02/19/24 0323 02/20/24 0344 02/21/24 0248 02/23/24 0311  WBC 4.8 3.6* 5.9 4.5 6.0  NEUTROABS  --   --   --   --  3.6  HGB 8.2* 7.7*  8.5* 8.2* 8.7*  HCT 26.2* 25.0* 27.7* 27.3* 28.1*  MCV 76.6* 77.9* 77.8* 78.9* 77.8*  PLT 65* 58* 67* 55* 67*   Basic Metabolic Panel: Recent Labs  Lab 02/18/24 0022 02/18/24 1448 02/19/24 0323 02/19/24 0948 02/20/24 0344 02/20/24 1449 02/21/24 0248 02/22/24 1444 02/23/24 0311  NA 144   < > 152* 149* 148* 146* 147*  --  138  K 3.7   < > 3.4* 3.5 3.4* 3.6 3.4*  --  3.6  CL 114*   < > 119* 118* 117* 117* 118*  --  110  CO2 16*   < > 23 23 20* 20* 20*  --  18*  GLUCOSE 104*   < > 137* 143* 127* 149* 120*  --  135*  BUN 19   < > 18 17 15 15 16   --  12  CREATININE 0.87   < > 0.91 0.82 0.90 0.92 0.89  --  0.64  CALCIUM  8.7*   < > 8.5* 8.3* 8.8*  8.6* 8.4*  --  7.9*  MG 2.0  --  2.2  --  2.0  --   --  2.1 2.1  PHOS 3.0  --  4.0  --  3.2  --   --  2.1* 2.0*   < > = values in this interval not displayed.   GFR: Estimated Creatinine Clearance: 106.7 mL/min (by C-G formula based on SCr of 0.64 mg/dL). Liver Function Tests: Recent Labs  Lab 02/17/24 0209 02/19/24 0323 02/20/24 0344 02/21/24 0248  AST 64* 48* 51* 42*  ALT 25 27 27 24   ALKPHOS 102 91 102 97  BILITOT 1.6* 1.3* 1.3* 1.2  PROT 6.0* 5.6* 6.0* 5.5*  ALBUMIN  3.3* 3.1* 3.2* 2.9*   No results for input(s): LIPASE, AMYLASE in the last 168 hours. Recent Labs  Lab 02/17/24 0209 02/19/24 0323  AMMONIA 33 41*   Coagulation Profile: No results for input(s): INR, PROTIME in the last 168 hours. Cardiac Enzymes: No results for input(s): CKTOTAL, CKMB, CKMBINDEX, TROPONINI in the last 168 hours. BNP (last 3 results) No results for input(s): PROBNP in the last 8760 hours. HbA1C: No results for input(s): HGBA1C in the last 72 hours. CBG: Recent Labs  Lab 02/22/24 1610 02/22/24 1938 02/22/24 2356 02/23/24 0402 02/23/24 0731  GLUCAP 103* 108* 112* 138* 155*   Lipid Profile: No results for input(s): CHOL, HDL, LDLCALC, TRIG, CHOLHDL, LDLDIRECT in the last 72 hours. Thyroid  Function  Tests: No results for input(s): TSH, T4TOTAL, FREET4, T3FREE, THYROIDAB in the last 72 hours. Anemia Panel: No results for input(s): VITAMINB12, FOLATE, FERRITIN, TIBC, IRON , RETICCTPCT in the last 72 hours. Sepsis Labs: No results for input(s): PROCALCITON, LATICACIDVEN in the last 168 hours.  Recent Results (from the past 240 hours)  MRSA Next Gen by PCR, Nasal     Status: None   Collection Time: 02/15/24  4:00 PM   Specimen: Nasal Mucosa; Nasal Swab  Result Value Ref Range Status   MRSA by PCR Next Gen NOT DETECTED NOT DETECTED Final    Comment: (NOTE) The GeneXpert MRSA Assay (FDA approved for NASAL specimens only), is one component of a comprehensive MRSA colonization surveillance program. It is not intended to diagnose MRSA infection nor to guide or monitor treatment for MRSA infections. Test performance is not FDA approved in patients less than 45 years old. Performed at [facility], [address] [place], [place] [address]          Radiology Studies: No results found.         LOS: 9 days   Time spent= 35 mins    Deliliah Room, MD [facility]  If 7PM-7AM, please contact night-coverage  02/23/2024, 9:35 AM  "

## 2024-02-23 NOTE — Progress Notes (Signed)
 SLP Cancellation Note  Patient Details Name: Robert Greene MRN: [id number] DOB: Feb 29, 1960   Cancelled treatment:       Reason Eval/Treat Not Completed: Fatigue/lethargy limiting ability to participate (Per RN.). RN requested that SLP be allowed to rest.   Rea Pass MA, CCC-SLP    Ihsan Nomura Meryl 02/23/2024, 10:28 AM

## 2024-02-23 NOTE — Plan of Care (Signed)
" °  Problem: Clinical Measurements: Goal: Will remain free from infection Outcome: Progressing   Problem: Nutrition: Goal: Adequate nutrition will be maintained Outcome: Progressing   Problem: Coping: Goal: Level of anxiety will decrease Outcome: Progressing   Problem: Elimination: Goal: Will not experience complications related to urinary retention Outcome: Progressing   Problem: Pain Managment: Goal: General experience of comfort will improve and/or be controlled Outcome: Progressing   Problem: Safety: Goal: Ability to remain free from injury will improve Outcome: Progressing   Problem: Safety: Goal: Non-violent Restraint(s) Outcome: Not Applicable   "

## 2024-02-24 ENCOUNTER — Inpatient Hospital Stay (HOSPITAL_COMMUNITY)

## 2024-02-24 LAB — GLUCOSE, CAPILLARY
Glucose-Capillary: 120 mg/dL — ABNORMAL HIGH (ref 70–99)
Glucose-Capillary: 128 mg/dL — ABNORMAL HIGH (ref 70–99)
Glucose-Capillary: 138 mg/dL — ABNORMAL HIGH (ref 70–99)

## 2024-02-24 LAB — PHOSPHORUS: Phosphorus: 2 mg/dL — ABNORMAL LOW (ref 2.5–4.6)

## 2024-02-24 LAB — MAGNESIUM: Magnesium: 2.1 mg/dL (ref 1.7–2.4)

## 2024-02-24 MED ORDER — SODIUM PHOSPHATES 45 MMOLE/15ML IV SOLN
15.0000 mmol | Freq: Once | INTRAVENOUS | Status: AC
  Administered 2024-02-24: 15 mmol via INTRAVENOUS
  Filled 2024-02-24: qty 5

## 2024-02-24 NOTE — Progress Notes (Addendum)
 " PROGRESS NOTE    Robert Greene  FMW:[id number] DOB: 05/12/1960 DOA: 02/14/2024 PCP: [name], MD   Brief Narrative:    64 y.o. male with past medical history  of alcoholism, essential hypertension, hyperlipidemia, rheumatoid arthritis with history of Enbrel, liver cirrhosis and history of decompensated liver cirrhosis with ascites portal hypertensive gastropathy history of hepatic encephalopathy alcohol withdrawal, history of C. difficile colitis, pulmonary fibrosis, medical noncompliance, tobacco abuse,  brought by EMS from home with multiple complaints including weakness, black tarry stools in the setting of alcohol abuse.  S/p EGD done on 02/15/24-Grade II varices in middle third of esophagus and in lower third, not actively bleeding, s/p variceal banding x 4. Moderate portal hypertensive gastropathy.  PCCM consulted for severe alcohol withdrawal symptoms needing precedex  drip and phenobarbital  taper.  TRH assumed care on 1/30  1/30: Cortrack placed because of poor oral intake in the setting of alcohol withdrawal/encephalopathy. On Tube feeds with osmolite.    Assessment & Plan:  Principal Problem:   GIB (gastrointestinal bleeding) Active Problems:   Esophageal varices without bleeding (HCC)   Alcoholic cirrhosis (HCC)   Hyperammonemia   Alcohol withdrawal syndrome, with delirium (HCC)    Alcohol withdrawal symptoms Chronic alcohol use  Counseled on alcohol cessation  Continue thiamine  and folate Transferred to PCCM for phenobarbital  taper and precedex  infusions TRH assumed care on 1/30 Dced phenobarbital  on 1/30 as this was making him too drowsy and confused Off of precedex  Continue CIWA protocol.    Acute blood loss anemia likely secondary to GI bleed Patient with a history of liver cirrhosis and history of decompensated liver cirrhosis with ascites portal hypertensive gastropathy  Hemoglobin noted to be 6.7 upon presentation S/p blood transfusion Continue  Protonix  GI consulted, s/p EGD 02/15/24: Grade 2 esophageal varices, incompletely eradicated, banded.  No active bleeding.  Portal hypertensive gastropathy.  GI recommended to continue PPI, no indication for octreotide . Needs repeat EGD in 4 weeks.     Hypokalemia, replete as needed Hypophosphatemia, ordered repletion  Hypernatremia: Secondary to dehydration and poor oral intake.  Resolved now   Metabolic acidosis, monitor BMP closely     Acute hepatic encephalopathy Decompensated liver cirrhosis Thrombocytopenia likely secondary to alcohol and cirrhosis Continue with lactulose  and rifaximin . GI On board     Essential hypertension Coreg  6.25 mg PO BID    Hyperlipidemia: resume statin when appropriate   History of rheumatoid arthritis Outpatient follow-up  Nutrition: Cortrak placed on 1/30. RD on board. On osmolite 1.5 at 55 cc/h  Disposition: He would likely need a skilled nursing facility placement.   DVT prophylaxis: SCDs Start: 02/14/24 1902     Code Status: Full Code Family Communication:  None at the bedside Status is: Inpatient Remains inpatient appropriate because: acute encephalopathy, poor oral intake, alcohol withdrawal    Subjective:  Appears confused and drowsy, but able respond to questions appropriately.  He was trying to eat his breakfast.  He currently has a left nostril core track in place and is on Osmolite 1.5 at 55 cc/h.  He thinks that he is [place] [facility].  Examination:  General exam: confused and disoriented, unable to engage in a meaningful conversation.  He is slightly more awake today as compared to yesterday. Respiratory system: Clear to auscultation. Respiratory effort normal. Cardiovascular system: S1 & S2 heard, RRR. No JVD, murmurs, rubs, gallops or clicks. No pedal edema. Gastrointestinal system: Abdomen is nondistended, soft and nontender. No organomegaly or masses felt. Normal bowel sounds heard. Central  nervous system:  confused and disoriented Extremities: No obvious abnormalities Skin: No rashes, lesions or ulcers Psychiatry: confused and disoriented     Diet Orders (From admission, onward)     Start     Ordered   02/22/24 0924  DIET DYS 2 Room service appropriate? No; Fluid consistency: Thin  Diet effective now       Comments: Meds as tolerated (whole or crushed in puree)  Question Answer Comment  Room service appropriate? No   Fluid consistency: Thin      02/22/24 0923            Objective: Vitals:   02/24/24 0000 02/24/24 0200 02/24/24 0257 02/24/24 0400  BP: (!) 148/85 (!) 142/91  134/87  Pulse: 90 92 92 95  Resp: 20 20 20 17   Temp: 98.6 F (37 C) 98.7 F (37.1 C)  98.6 F (37 C)  TempSrc: Oral Oral  Oral  SpO2: 98% 100% 98% 100%  Weight:   93.7 kg   Height:        Intake/Output Summary (Last 24 hours) at 02/24/2024 0735 Last data filed at 02/24/2024 0601 Gross per 24 hour  Intake 969.33 ml  Output 1100 ml  Net -130.67 ml   Filed Weights   02/22/24 0327 02/23/24 0405 02/24/24 0257  Weight: 95.3 kg 93.5 kg 93.7 kg    Scheduled Meds:  carvedilol   6.25 mg Oral BID WC   feeding supplement  237 mL Oral BID WC   feeding supplement (PROSource TF20)  60 mL Per Tube Daily   folic acid   1 mg Oral Daily   lactulose   20 g Oral TID   LORazepam   0-4 mg Intravenous Q8H   multivitamin with minerals  1 tablet Per Tube Daily   nicotine   21 mg Transdermal Daily   pantoprazole   40 mg Oral BID   rifaximin   550 mg Per Tube BID   thiamine   100 mg Oral Daily   Continuous Infusions:  feeding supplement (OSMOLITE 1.5 CAL) 55 mL/hr at 02/24/24 0220    Nutritional status Signs/Symptoms: NPO status Interventions: Refer to RD note for recommendations Body mass index is 30.51 kg/m.  Data Reviewed:   CBC: Recent Labs  Lab 02/18/24 0505 02/19/24 0323 02/20/24 0344 02/21/24 0248 02/23/24 0311  WBC 4.8 3.6* 5.9 4.5 6.0  NEUTROABS  --   --   --   --  3.6  HGB 8.2* 7.7* 8.5* 8.2*  8.7*  HCT 26.2* 25.0* 27.7* 27.3* 28.1*  MCV 76.6* 77.9* 77.8* 78.9* 77.8*  PLT 65* 58* 67* 55* 67*   Basic Metabolic Panel: Recent Labs  Lab 02/19/24 0323 02/19/24 0948 02/20/24 0344 02/20/24 1449 02/21/24 0248 02/22/24 1444 02/23/24 0311 02/24/24 0200  NA 152* 149* 148* 146* 147*  --  138  --   K 3.4* 3.5 3.4* 3.6 3.4*  --  3.6  --   CL 119* 118* 117* 117* 118*  --  110  --   CO2 23 23 20* 20* 20*  --  18*  --   GLUCOSE 137* 143* 127* 149* 120*  --  135*  --   BUN 18 17 15 15 16   --  12  --   CREATININE 0.91 0.82 0.90 0.92 0.89  --  0.64  --   CALCIUM  8.5* 8.3* 8.8* 8.6* 8.4*  --  7.9*  --   MG 2.2  --  2.0  --   --  2.1 2.1 2.1  PHOS 4.0  --  3.2  --   --  2.1* 2.0* 2.0*   GFR: Estimated Creatinine Clearance: 106.8 mL/min (by C-G formula based on SCr of 0.64 mg/dL). Liver Function Tests: Recent Labs  Lab 02/19/24 0323 02/20/24 0344 02/21/24 0248  AST 48* 51* 42*  ALT 27 27 24   ALKPHOS 91 102 97  BILITOT 1.3* 1.3* 1.2  PROT 5.6* 6.0* 5.5*  ALBUMIN  3.1* 3.2* 2.9*   No results for input(s): LIPASE, AMYLASE in the last 168 hours. Recent Labs  Lab 02/19/24 0323  AMMONIA 41*   Coagulation Profile: No results for input(s): INR, PROTIME in the last 168 hours. Cardiac Enzymes: No results for input(s): CKTOTAL, CKMB, CKMBINDEX, TROPONINI in the last 168 hours. BNP (last 3 results) No results for input(s): PROBNP in the last 8760 hours. HbA1C: No results for input(s): HGBA1C in the last 72 hours. CBG: Recent Labs  Lab 02/23/24 1158 02/23/24 1709 02/23/24 1958 02/24/24 0005 02/24/24 0409  GLUCAP 99 116* 131* 120* 128*   Lipid Profile: No results for input(s): CHOL, HDL, LDLCALC, TRIG, CHOLHDL, LDLDIRECT in the last 72 hours. Thyroid  Function Tests: No results for input(s): TSH, T4TOTAL, FREET4, T3FREE, THYROIDAB in the last 72 hours. Anemia Panel: No results for input(s): VITAMINB12, FOLATE, FERRITIN,  TIBC, IRON , RETICCTPCT in the last 72 hours. Sepsis Labs: No results for input(s): PROCALCITON, LATICACIDVEN in the last 168 hours.  Recent Results (from the past 240 hours)  MRSA Next Gen by PCR, Nasal     Status: None   Collection Time: 02/15/24  4:00 PM   Specimen: Nasal Mucosa; Nasal Swab  Result Value Ref Range Status   MRSA by PCR Next Gen NOT DETECTED NOT DETECTED Final    Comment: (NOTE) The GeneXpert MRSA Assay (FDA approved for NASAL specimens only), is one component of a comprehensive MRSA colonization surveillance program. It is not intended to diagnose MRSA infection nor to guide or monitor treatment for MRSA infections. Test performance is not FDA approved in patients less than 89 years old. Performed at [facility], [address], [place], [place] [address]          Radiology Studies: No results found.         LOS: 10 days   Time spent= 35 mins    Deliliah Room, MD [facility]  If 7PM-7AM, please contact night-coverage  02/24/2024, 7:35 AM  "

## 2024-02-24 NOTE — Progress Notes (Signed)
 Patient with increased coughing since this evening per report; pt is on tube feeds via cortrak. Lung sounds WNL at this time. Night coverage MD was notified. STAT CXR ordered.

## 2024-02-25 ENCOUNTER — Inpatient Hospital Stay (HOSPITAL_COMMUNITY)

## 2024-02-25 DIAGNOSIS — J690 Pneumonitis due to inhalation of food and vomit: Secondary | ICD-10-CM | POA: Insufficient documentation

## 2024-02-25 LAB — GLUCOSE, CAPILLARY
Glucose-Capillary: 105 mg/dL — ABNORMAL HIGH (ref 70–99)
Glucose-Capillary: 106 mg/dL — ABNORMAL HIGH (ref 70–99)
Glucose-Capillary: 119 mg/dL — ABNORMAL HIGH (ref 70–99)
Glucose-Capillary: 124 mg/dL — ABNORMAL HIGH (ref 70–99)
Glucose-Capillary: 124 mg/dL — ABNORMAL HIGH (ref 70–99)
Glucose-Capillary: 125 mg/dL — ABNORMAL HIGH (ref 70–99)
Glucose-Capillary: 94 mg/dL (ref 70–99)

## 2024-02-25 LAB — COMPREHENSIVE METABOLIC PANEL WITH GFR
ALT: 26 U/L (ref 0–44)
AST: 42 U/L — ABNORMAL HIGH (ref 15–41)
Albumin: 2.8 g/dL — ABNORMAL LOW (ref 3.5–5.0)
Alkaline Phosphatase: 118 U/L (ref 38–126)
Anion gap: 10 (ref 5–15)
BUN: 14 mg/dL (ref 8–23)
CO2: 18 mmol/L — ABNORMAL LOW (ref 22–32)
Calcium: 8.6 mg/dL — ABNORMAL LOW (ref 8.9–10.3)
Chloride: 113 mmol/L — ABNORMAL HIGH (ref 98–111)
Creatinine, Ser: 0.71 mg/dL (ref 0.61–1.24)
GFR, Estimated: >60 mL/min
Glucose, Bld: 114 mg/dL — ABNORMAL HIGH (ref 70–99)
Potassium: 3.8 mmol/L (ref 3.5–5.1)
Sodium: 141 mmol/L (ref 135–145)
Total Bilirubin: 1.6 mg/dL — ABNORMAL HIGH (ref 0.0–1.2)
Total Protein: 5.8 g/dL — ABNORMAL LOW (ref 6.5–8.1)

## 2024-02-25 LAB — CBC WITH DIFFERENTIAL/PLATELET
Abs Immature Granulocytes: 0.06 10*3/uL (ref 0.00–0.07)
Basophils Absolute: 0.1 10*3/uL (ref 0.0–0.1)
Basophils Relative: 1 %
Eosinophils Absolute: 0 10*3/uL (ref 0.0–0.5)
Eosinophils Relative: 0 %
HCT: 28 % — ABNORMAL LOW (ref 39.0–52.0)
Hemoglobin: 8.9 g/dL — ABNORMAL LOW (ref 13.0–17.0)
Immature Granulocytes: 1 %
Lymphocytes Relative: 13 %
Lymphs Abs: 1.4 10*3/uL (ref 0.7–4.0)
MCH: 24.7 pg — ABNORMAL LOW (ref 26.0–34.0)
MCHC: 31.8 g/dL (ref 30.0–36.0)
MCV: 77.8 fL — ABNORMAL LOW (ref 80.0–100.0)
Monocytes Absolute: 1.3 10*3/uL — ABNORMAL HIGH (ref 0.1–1.0)
Monocytes Relative: 11 %
Neutro Abs: 8.4 10*3/uL — ABNORMAL HIGH (ref 1.7–7.7)
Neutrophils Relative %: 74 %
Platelets: 97 10*3/uL — ABNORMAL LOW (ref 150–400)
RBC: 3.6 MIL/uL — ABNORMAL LOW (ref 4.22–5.81)
RDW: 24.4 % — ABNORMAL HIGH (ref 11.5–15.5)
WBC: 11.3 10*3/uL — ABNORMAL HIGH (ref 4.0–10.5)
nRBC: 0 % (ref 0.0–0.2)

## 2024-02-25 LAB — PHOSPHORUS: Phosphorus: 1.9 mg/dL — ABNORMAL LOW (ref 2.5–4.6)

## 2024-02-25 LAB — MAGNESIUM: Magnesium: 2.3 mg/dL (ref 1.7–2.4)

## 2024-02-25 MED ORDER — LACTATED RINGERS IV SOLN: INTRAVENOUS | Status: AC

## 2024-02-25 MED ORDER — FOLIC ACID 5 MG/ML IJ SOLN
1.0000 mg | Freq: Once | INTRAMUSCULAR | Status: AC
  Administered 2024-02-25: 1 mg via INTRAVENOUS
  Filled 2024-02-25: qty 0.2

## 2024-02-25 MED ORDER — FREE WATER
165.0000 mL | Status: AC
  Administered 2024-02-25 – 2024-02-29 (×24): 165 mL

## 2024-02-25 MED ORDER — ACETAMINOPHEN 325 MG PO TABS
325.0000 mg | ORAL_TABLET | Freq: Once | ORAL | Status: DC
  Filled 2024-02-25: qty 1

## 2024-02-25 MED ORDER — ACETAMINOPHEN 650 MG RE SUPP
325.0000 mg | Freq: Once | RECTAL | Status: AC
  Administered 2024-02-25: 325 mg via RECTAL
  Filled 2024-02-25: qty 1

## 2024-02-25 MED ORDER — VANCOMYCIN HCL 1250 MG/250ML IV SOLN
1250.0000 mg | Freq: Two times a day (BID) | INTRAVENOUS | Status: DC
  Administered 2024-02-25 – 2024-02-27 (×4): 1250 mg via INTRAVENOUS
  Filled 2024-02-25 (×5): qty 250

## 2024-02-25 MED ORDER — SODIUM PHOSPHATES 45 MMOLE/15ML IV SOLN
30.0000 mmol | Freq: Once | INTRAVENOUS | Status: AC
  Administered 2024-02-25: 30 mmol via INTRAVENOUS
  Filled 2024-02-25: qty 10

## 2024-02-25 MED ORDER — PIPERACILLIN-TAZOBACTAM 3.375 G IVPB
3.3750 g | Freq: Three times a day (TID) | INTRAVENOUS | Status: AC
  Administered 2024-02-25 – 2024-02-29 (×15): 3.375 g via INTRAVENOUS
  Filled 2024-02-25 (×15): qty 50

## 2024-02-25 MED ORDER — VANCOMYCIN HCL 2000 MG/400ML IV SOLN
2000.0000 mg | Freq: Once | INTRAVENOUS | Status: AC
  Administered 2024-02-25: 2000 mg via INTRAVENOUS
  Filled 2024-02-25: qty 400

## 2024-02-25 NOTE — Progress Notes (Signed)
 Nutrition Follow-up  DOCUMENTATION CODES:  Not applicable  INTERVENTION:  Resume tube feeds via Cortrak: Resume Osmolite 1.5 at 66ml/hr and advance by 10ml q8h back to goal rate of 27ml/hr ( per day) 60ml ProSource TF20 once daily Recommend free water  flushes 165ml q4h to provide 990ml per day TF at goal provides: 2060 kcal, 103g protein and per day  Monitor SLP assessment and ability to resume oral diet as mentation allows  NUTRITION DIAGNOSIS:  Inadequate oral intake related to inability to eat (cirrhosis) as evidenced by NPO status. - remains applicable  GOAL:  Patient will meet greater than or equal to 90% of their needs - goal met via TF  MONITOR:  PO intake, Supplement acceptance, Diet advancement, Weight trends, Labs, Skin, I & O's  REASON FOR ASSESSMENT:  Consult Enteral/tube feeding initiation and management  ASSESSMENT:  Pt with PMH significant for: HTN, tobacco use, chronic alcoholism, anemia (chronic illness and iron  deficiency), cirrhosis, esophageal varices, and portal gastropathy. Presented with upper GI bleed s/p EGD showing esophageal varices and portal gastropathy and required blood transfusion.  1/22 - presented to ED 1/23 - EGD showed grade 2 esophageal varices which were banded  1/26 - transferred to ICU to manage precedex  for withdrawal 1/28 - cortrak attempted, unable to pass into the stomach, full liquids  1/29 - dysphagia 2, thin liquid diet per SLP 1/30 - Cortrak placed; TF initiated 2/2 - cortrak dislodged; replaced; NPO per SLP  Over the weekend pt with increased secretions and persistent coughing and food particles suctioned. Chest xray reflects cortrak dislodged and subsequently pulled.   Cortrak replaced today and diet downgraded to NPO per SLP d/t persistent lethargy and new concern for possible aspiration. Recommended for MBS and esopahgeal sweep when pt is alert enough.   Checked in with patient at bedside. He remains drowsy  and unable to provide meaningful information. Despite coughing episodes, pt without report of nausea and emesis since initiation of tube feeds.   Given phosphorus lab is lower today, will re-initiate tube feeds at lower rate with plan to titrate back to goal, to allow time for repletion.   Hypernatremia resolved.   Admit weight: 90.7 kg Current weight: 94.5 kg + edema: non-pitting BLE  Medications: folvite , lactulose  20g TID, MVI, protonix , thiamine  Drips: LR @ 53ml/hr Abx  Labs:  Chloride 113 Phosphorus 1.9 Albumin  2.8 AST 42 CBG's 105-124 x24 hours  Diet Order:   Diet Order             Diet NPO time specified Except for: Ice Chips  Diet effective now                   EDUCATION NEEDS:  Not appropriate for education at this time  Skin:  Skin Assessment: Reviewed RN Assessment  Last BM:  2/1 type 6 large  Height:  Ht Readings from Last 1 Encounters:  02/14/24 5' 9 (1.753 m)    Weight:  Wt Readings from Last 1 Encounters:  02/25/24 94.5 kg    Ideal Body Weight:  72.7 kg  BMI:  Body mass index is 30.77 kg/m.  Estimated Nutritional Needs:   Kcal:  2000-2200 kcals  Protein:  95-110g  Fluid:  >2L/day  Royce Maris, RDN, LDN Clinical Nutrition See AMiON for contact information.

## 2024-02-25 NOTE — Progress Notes (Signed)
 Patient developed a fever (101.4), has been in sinus tachycardia/SVT, continued coughing, requiring oral suction - eventually pt able to cough up food particles. MD notified again and came to bedside. CN and Rapid response RN made aware of situation. See new orders.

## 2024-02-25 NOTE — Plan of Care (Addendum)
 Patient noticed to have increased respiratory secretion and also started having a temperature of 101.4 F with tachycardia.  Chest x-ray did not show anything acute.  On exam at bedside patient is having persistent productive cough.  I reviewed patient's labs notes and medications.  Will get blood cultures start patient on empiric antibiotics get CT chest given the persistent productive cough suspect patient may be having possible aspiration.  Addendum -looking at the chest x-ray patient's core track tube was displaced and so it was pulled out by the rapid response nurse Mr. Alm.  Will follow CT chest.  Core track to has to be placed in the morning again.  Redia Said MD.

## 2024-02-25 NOTE — Progress Notes (Signed)
 SLP Cancellation Note  Patient Details Name: Robert Greene MRN: [id number] DOB: 1960/01/29   Cancelled treatment:       Reason Eval/Treat Not Completed: Fatigue/lethargy limiting ability to participate. SLP made pt NPO due to persistent lethargy and new concern for possible aspiration given recent rever and coughing up food particles. Per notes, anticipate Cortrak to be replaced today. SLP will follow up when pt is more alert and able to safely participate in swallow assessment. Recommend MBSS + esophageal sweep when pt is alert enough to complete.    Abdulla Pooley [employer] 02/25/2024, 11:46 AM

## 2024-02-25 NOTE — Progress Notes (Signed)
" °   02/24/24 2319  Assess: MEWS Score  Temp 99.2 F (37.3 C)  BP (!) 161/73  MAP (mmHg) 85  Pulse Rate (!) 115  ECG Heart Rate (!) 115  Resp (!) 22  SpO2 94 %  O2 Device Room Air  Assess: MEWS Score  MEWS Temp 0  MEWS Systolic 0  MEWS Pulse 2  MEWS RR 1  MEWS LOC 1  MEWS Score 4  MEWS Score Color Red  Assess: if the MEWS score is Yellow or Red  Were vital signs accurate and taken at a resting state? Yes  Does the patient meet 2 or more of the SIRS criteria? Yes  Does the patient have a confirmed or suspected source of infection? No  MEWS guidelines implemented  Yes, red  Treat  MEWS Interventions Considered administering scheduled or prn medications/treatments as ordered  Take Vital Signs  Increase Vital Sign Frequency  Red: Q1hr x2, continue Q4hrs until patient remains green for 12hrs  Escalate  MEWS: Escalate Red: Discuss with charge nurse and notify provider. Consider notifying RRT. If remains red for 2 hours consider need for higher level of care  Notify: Charge Nurse/RN  Name of Charge Nurse/RN Notified Reden, RN  Assess: SIRS CRITERIA  SIRS Temperature  0  SIRS Respirations  1  SIRS Pulse 1  SIRS WBC 0  SIRS Score Sum  2   PRN hypertensive med given. "

## 2024-02-25 NOTE — Procedures (Signed)
 Cortrak  Person Inserting Tube:  Robert Greene, RD Tube Type:  Cortrak - 43 inches Tube Size:  10 Tube Location:  Left nare Secured by: Bridle Initial Placement:  Gastric Technique Used to Measure Tube Placement:  Marking at nare/corner of mouth Cortrak Secured At:  70 cm Initial Placement Verification:  Cortrak device (Registered Dieticians Only)  Cortrak Tube Team Note:  Consult received to place a Cortrak feeding tube.   No x-ray is required. RN may begin using tube.   If the tube becomes dislodged please keep the tube and contact the Cortrak team at [email or web address] for replacement.  If after hours and replacement cannot be delayed, place a NG tube and confirm placement with an abdominal x-ray.    Robert Elihue, MS, RDN, LDN Clinical Dietitian I Please reach out via secure chat

## 2024-02-25 NOTE — Progress Notes (Signed)
 Cortrak halfway out; cortrak removed. MD made aware.

## 2024-02-26 ENCOUNTER — Telehealth: Payer: Self-pay

## 2024-02-26 DIAGNOSIS — J690 Pneumonitis due to inhalation of food and vomit: Secondary | ICD-10-CM | POA: Diagnosis not present

## 2024-02-26 LAB — COMPREHENSIVE METABOLIC PANEL WITH GFR
ALT: 26 U/L (ref 0–44)
AST: 42 U/L — ABNORMAL HIGH (ref 15–41)
Albumin: 2.9 g/dL — ABNORMAL LOW (ref 3.5–5.0)
Alkaline Phosphatase: 123 U/L (ref 38–126)
Anion gap: 8 (ref 5–15)
BUN: 14 mg/dL (ref 8–23)
CO2: 19 mmol/L — ABNORMAL LOW (ref 22–32)
Calcium: 8.7 mg/dL — ABNORMAL LOW (ref 8.9–10.3)
Chloride: 115 mmol/L — ABNORMAL HIGH (ref 98–111)
Creatinine, Ser: 0.6 mg/dL — ABNORMAL LOW (ref 0.61–1.24)
GFR, Estimated: >60 mL/min
Glucose, Bld: 123 mg/dL — ABNORMAL HIGH (ref 70–99)
Potassium: 3.7 mmol/L (ref 3.5–5.1)
Sodium: 142 mmol/L (ref 135–145)
Total Bilirubin: 1.4 mg/dL — ABNORMAL HIGH (ref 0.0–1.2)
Total Protein: 6 g/dL — ABNORMAL LOW (ref 6.5–8.1)

## 2024-02-26 LAB — GLUCOSE, CAPILLARY
Glucose-Capillary: 114 mg/dL — ABNORMAL HIGH (ref 70–99)
Glucose-Capillary: 118 mg/dL — ABNORMAL HIGH (ref 70–99)
Glucose-Capillary: 127 mg/dL — ABNORMAL HIGH (ref 70–99)
Glucose-Capillary: 128 mg/dL — ABNORMAL HIGH (ref 70–99)
Glucose-Capillary: 130 mg/dL — ABNORMAL HIGH (ref 70–99)
Glucose-Capillary: 142 mg/dL — ABNORMAL HIGH (ref 70–99)

## 2024-02-26 LAB — CBC
HCT: 29.1 % — ABNORMAL LOW (ref 39.0–52.0)
Hemoglobin: 9 g/dL — ABNORMAL LOW (ref 13.0–17.0)
MCH: 24.3 pg — ABNORMAL LOW (ref 26.0–34.0)
MCHC: 30.9 g/dL (ref 30.0–36.0)
MCV: 78.4 fL — ABNORMAL LOW (ref 80.0–100.0)
Platelets: 97 10*3/uL — ABNORMAL LOW (ref 150–400)
RBC: 3.71 MIL/uL — ABNORMAL LOW (ref 4.22–5.81)
RDW: 24.2 % — ABNORMAL HIGH (ref 11.5–15.5)
WBC: 8.2 10*3/uL (ref 4.0–10.5)
nRBC: 0 % (ref 0.0–0.2)

## 2024-02-26 NOTE — Telephone Encounter (Signed)
-----   Message from Aloha Finner, MD sent at 02/22/2024  9:54 PM EST ----- Regarding: Followup Robert Greene, Patient needs clinic followup in Pod with PA in next 6-weeks.  He will be inhouse for a few more days, so you can place the appointment so that it is available on his discharge paperwork. Thanks. GM  Mireya Meditz, This patient needs EGD in hospital for variceal surveillance protocol in next 6-8 weeks. Thanks. GM

## 2024-02-26 NOTE — Telephone Encounter (Signed)
 Appt scheduled and pt to be advised at discharge from hospital.

## 2024-02-27 ENCOUNTER — Other Ambulatory Visit: Payer: Self-pay

## 2024-02-27 ENCOUNTER — Inpatient Hospital Stay (HOSPITAL_COMMUNITY)

## 2024-02-27 DIAGNOSIS — I8500 Esophageal varices without bleeding: Secondary | ICD-10-CM

## 2024-02-27 DIAGNOSIS — K7030 Alcoholic cirrhosis of liver without ascites: Secondary | ICD-10-CM

## 2024-02-27 DIAGNOSIS — J690 Pneumonitis due to inhalation of food and vomit: Secondary | ICD-10-CM | POA: Diagnosis not present

## 2024-02-27 LAB — GLUCOSE, CAPILLARY
Glucose-Capillary: 126 mg/dL — ABNORMAL HIGH (ref 70–99)
Glucose-Capillary: 143 mg/dL — ABNORMAL HIGH (ref 70–99)
Glucose-Capillary: 123 mg/dL — ABNORMAL HIGH (ref 70–99)
Glucose-Capillary: 123 mg/dL — ABNORMAL HIGH (ref 70–99)
Glucose-Capillary: 126 mg/dL — ABNORMAL HIGH (ref 70–99)
Glucose-Capillary: 108 mg/dL — ABNORMAL HIGH (ref 70–99)

## 2024-02-27 NOTE — Progress Notes (Signed)
 " PROGRESS NOTE    Robert Greene  FMW:[id number] DOB: Apr 11, 1960 DOA: 02/14/2024 PCP: [name], MD   Brief Narrative:   64 y.o. male with past medical history  of alcoholism, essential hypertension, hyperlipidemia, rheumatoid arthritis with history of Enbrel, liver cirrhosis and history of decompensated liver cirrhosis with ascites, portal hypertensive gastropathy, history of hepatic encephalopathy, alcohol withdrawal, history of C. difficile colitis, pulmonary fibrosis, medical noncompliance, tobacco abuse,  brought by EMS from home with multiple complaints including weakness, black tarry stools in the setting of alcohol abuse.  S/p EGD done on 02/15/24-Grade II varices in middle third of esophagus and in lower third, not actively bleeding, s/p variceal banding x 4. Moderate portal hypertensive gastropathy.  PCCM consulted for severe alcohol withdrawal symptoms needing precedex  drip and phenobarbital  taper.  TRH assumed care on 1/30  1/30: Cortrack placed because of poor oral intake in the setting of alcohol withdrawal/encephalopathy. On Tube feeds with osmolite.  2/2: Developed fever. Concern for aspiration. Started on broad spectrum antibiotics.    Assessment & Plan:  Principal Problem:   Aspiration pneumonia of right middle lobe [facility]) Active Problems:   Esophageal varices without bleeding (HCC)   Alcoholic cirrhosis (HCC)   GIB (gastrointestinal bleeding)   Hyperammonemia   Alcohol withdrawal syndrome, with delirium (HCC)   Fever Right sided pneumonia, likely due to aspiration Patient developed fever in the early hours of 02/25/2024. Noted to have increased respiratory secretions. Also noted to have dislodged his Dobbhoff tube. Tube has been replaced. CT chest reveals right-sided pneumonia. -Patient started on broad-spectrum antibiotics (vancomycin , Zosyn ).  Alcohol withdrawal symptoms Chronic alcohol use  Counseled on alcohol cessation  Continue thiamine  and  folate Transferred to PCCM for phenobarbital  taper and precedex  infusions TRH assumed care on 1/30 Dced phenobarbital  on 1/30 as this was making him too drowsy and confused Off of precedex  Continue CIWA protocol.   Acute blood loss anemia likely secondary to GI bleed Patient with a history of liver cirrhosis and history of decompensated liver cirrhosis with ascites portal hypertensive gastropathy  Hemoglobin noted to be 6.7 upon presentation S/p blood transfusion Continue Protonix  GI consulted, s/p EGD 02/15/24: Grade 2 esophageal varices, incompletely eradicated, banded.  No active bleeding.  Portal hypertensive gastropathy.  GI recommended to continue PPI, no indication for octreotide . Needs repeat EGD in 4 weeks.   Hypokalemia, replete as needed Hypophosphatemia, replete as needed  Hypernatremia: Secondary to dehydration and poor oral intake.  Resolved now   Metabolic acidosis, monitor BMP closely    Acute hepatic encephalopathy Decompensated liver cirrhosis Thrombocytopenia likely secondary to alcohol and cirrhosis Continue with lactulose  and rifaximin . GI On board  Dysphagia DHT placed on 1/30 (replaced on 2/2). RD on board. On osmolite 1.5 at 55 cc/h SLP following.  Patient has not yet been cleared for oral intake-appears to have poor secretion management. - Continue tube feeds via Dobbhoff.  Essential hypertension Coreg  6.25 mg PO BID  Hyperlipidemia: resume statin when appropriate   History of rheumatoid arthritis Outpatient follow-up  Disposition: He would likely need a skilled nursing facility placement.  -PT evaluation ordered.     DVT prophylaxis: SCDs Start: 02/14/24 1902     Code Status: Full Code  Family Communication: n/a  Status is: Inpatient Remains inpatient appropriate because: acute encephalopathy, poor oral intake on tube feeds, alcohol withdrawal  Subjective: Patient seems more alert today.   Examination:  General: Awake, alert,  responsive Eyes: Pupils equal, reactive  Oral cavity: moist mucous membranes  DHT  in place Head: Atraumatic, normocephalic  Neck: supple  Chest: clear to auscultation. No crackles, no wheezes  CVS: S1,S2 RRR. No murmurs  Abd: No distention, soft, non-tender. No masses palpable  Extr: No edema   MSK: No joint deformities or swelling  Neurological: Grossly intact.      Diet Orders (From admission, onward)     Start     Ordered   02/25/24 1147  Diet NPO time specified Except for: Ice Chips  Diet effective now       Question:  Except for  Answer:  Ice Chips   02/25/24 1146            Objective: Vitals:   02/27/24 0400 02/27/24 0500 02/27/24 0843 02/27/24 1100  BP: 123/86 (!) 127/97 122/79 (!) 130/99  Pulse: 96 92 100 100  Resp: 20 (!) 23 (!) 23 19  Temp: 97.9 F (36.6 C)  97.6 F (36.4 C) 98.5 F (36.9 C)  TempSrc: Oral  Oral Oral  SpO2: 97% 100% 99% 96%  Weight:  93.7 kg    Height:        Intake/Output Summary (Last 24 hours) at 02/27/2024 1307 Last data filed at 02/27/2024 0800 Gross per 24 hour  Intake 1790 ml  Output 1200 ml  Net 590 ml   Filed Weights   02/25/24 0722 02/26/24 0304 02/27/24 0500  Weight: 94.5 kg 95.4 kg 93.7 kg    Scheduled Meds:  acetaminophen   325 mg Oral Once   carvedilol   6.25 mg Oral BID WC   feeding supplement (PROSource TF20)  60 mL Per Tube Daily   folic acid   1 mg Oral Daily   free water   165 mL Per Tube Q4H   lactulose   20 g Oral TID   multivitamin with minerals  1 tablet Per Tube Daily   nicotine   21 mg Transdermal Daily   pantoprazole   40 mg Oral BID   rifaximin   550 mg Per Tube BID   thiamine   100 mg Oral Daily   Continuous Infusions:  feeding supplement (OSMOLITE 1.5 CAL) 1,000 mL (02/26/24 1946)   piperacillin -tazobactam (ZOSYN )  IV 3.375 g (02/27/24 1008)    Nutritional status Signs/Symptoms: NPO status Interventions: Refer to RD note for recommendations Body mass index is 30.51 kg/m.  Data Reviewed:    CBC: Recent Labs  Lab 02/21/24 0248 02/23/24 0311 02/25/24 1021 02/26/24 0330  WBC 4.5 6.0 11.3* 8.2  NEUTROABS  --  3.6 8.4*  --   HGB 8.2* 8.7* 8.9* 9.0*  HCT 27.3* 28.1* 28.0* 29.1*  MCV 78.9* 77.8* 77.8* 78.4*  PLT 55* 67* 97* 97*   Basic Metabolic Panel: Recent Labs  Lab 02/20/24 1449 02/21/24 0248 02/22/24 1444 02/23/24 0311 02/24/24 0200 02/25/24 0157 02/25/24 1021 02/26/24 0330  NA 146* 147*  --  138  --   --  141 142  K 3.6 3.4*  --  3.6  --   --  3.8 3.7  CL 117* 118*  --  110  --   --  113* 115*  CO2 20* 20*  --  18*  --   --  18* 19*  GLUCOSE 149* 120*  --  135*  --   --  114* 123*  BUN 15 16  --  12  --   --  14 14  CREATININE 0.92 0.89  --  0.64  --   --  0.71 0.60*  CALCIUM  8.6* 8.4*  --  7.9*  --   --  8.6* 8.7*  MG  --   --  2.1 2.1 2.1 2.3  --   --   PHOS  --   --  2.1* 2.0* 2.0* 1.9*  --   --    GFR: Estimated Creatinine Clearance: 106.8 mL/min (A) (by C-G formula based on SCr of 0.6 mg/dL (L)). Liver Function Tests: Recent Labs  Lab 02/21/24 0248 02/25/24 1021 02/26/24 0330  AST 42* 42* 42*  ALT 24 26 26   ALKPHOS 97 118 123  BILITOT 1.2 1.6* 1.4*  PROT 5.5* 5.8* 6.0*  ALBUMIN  2.9* 2.8* 2.9*   No results for input(s): LIPASE, AMYLASE in the last 168 hours. No results for input(s): AMMONIA in the last 168 hours.  Coagulation Profile: No results for input(s): INR, PROTIME in the last 168 hours. Cardiac Enzymes: No results for input(s): CKTOTAL, CKMB, CKMBINDEX, TROPONINI in the last 168 hours. BNP (last 3 results) No results for input(s): PROBNP in the last 8760 hours. HbA1C: No results for input(s): HGBA1C in the last 72 hours. CBG: Recent Labs  Lab 02/26/24 2003 02/26/24 2352 02/27/24 0407 02/27/24 0845 02/27/24 1132  GLUCAP 127* 130* 126* 143* 123*   Lipid Profile: No results for input(s): CHOL, HDL, LDLCALC, TRIG, CHOLHDL, LDLDIRECT in the last 72 hours. Thyroid  Function Tests: No  results for input(s): TSH, T4TOTAL, FREET4, T3FREE, THYROIDAB in the last 72 hours. Anemia Panel: No results for input(s): VITAMINB12, FOLATE, FERRITIN, TIBC, IRON , RETICCTPCT in the last 72 hours. Sepsis Labs: No results for input(s): PROCALCITON, LATICACIDVEN in the last 168 hours.  Recent Results (from the past 240 hours)  Culture, blood (Routine X 2) w Reflex to ID Panel     Status: None (Preliminary result)   Collection Time: 02/25/24  1:57 AM   Specimen: BLOOD  Result Value Ref Range Status   Specimen Description BLOOD SITE NOT SPECIFIED  Final   Special Requests   Final    BOTTLES DRAWN AEROBIC AND ANAEROBIC Blood Culture adequate volume   Culture   Final    NO GROWTH 2 DAYS Performed at [facility], [address] [place], [place] [address]    Report Status PENDING  Incomplete  Culture, blood (Routine X 2) w Reflex to ID Panel     Status: None (Preliminary result)   Collection Time: 02/25/24  1:58 AM   Specimen: BLOOD  Result Value Ref Range Status   Specimen Description BLOOD SITE NOT SPECIFIED  Final   Special Requests   Final    BOTTLES DRAWN AEROBIC AND ANAEROBIC Blood Culture results may not be optimal due to an inadequate volume of blood received in culture bottles   Culture   Final    NO GROWTH 2 DAYS Performed at [facility], [address], [place], [place] [address]    Report Status PENDING  Incomplete       Radiology Studies: No results found.    LOS: 13 days   MDALA-GAUSI, GOLDEN PILLOW, MD [facility]  If 7PM-7AM, please contact night-coverage  02/27/2024, 1:07 PM  "

## 2024-02-27 NOTE — Telephone Encounter (Signed)
 EGD has been set up for 04/17/24 at 2 pm at [facility] with GM   All information has been mailed, sent to My Chart and pt to be advised at discharge.

## 2024-02-27 NOTE — Procedures (Signed)
 Modified Barium Swallow Study  Patient Details  Name: Robert Greene MRN: [id number] Date of Birth: 1960-05-08  Today's Date: 02/27/2024  Modified Barium Swallow completed.  Full report located under Chart Review in the Imaging Section.  History of Present Illness Robert Greene is a 64 y.o.male who presented on 02/14/24 from home to ED secondary to three days of nausea and black tarry stools. In ED, fecal occult was positive and stool was bloody. He had an upper endoscopy 11/28/23 which showed grade 1 varices in lower third of the esophagus severe portal hypertensive gastropathy submucosal polypoid deformity in the second portion of the duodenum. He was admitted with GI bleed. Hospitalization complicated by ETOH withdrawal resulting in agitation and confusion with need for Precedex . EGD completed 02/15/24 which showed grade II esophageal varices in middle and lower third of esophagus and portal hypertensive gastropathy. Full liquids diet placed on 02/20/24 but RN observed patient to exhibit frequent coughing with liquids and SLP swallow evaluation was ordered. BSE completed 1/29 and Dys 2 (minced) solids, thin liquids diet initiated. Due to persistent lethargy and concern for potential aspiration, he was made NPO by SLP on 2/2 with recommendation for MBS when he was adequately alert. Cortrak has been placed. PMH: alcohol abuse w/ hx of alcohol withdrawal, Liver cirrhosis w/ ascites, ILD, tobacco abuse, HTN, HLD, RA.   Clinical Impression  Rec: NPO, water  protocol, SLP will follow for PO trials.  Patient presents with a mild-moderate oropharyngeal dysphagia as per this MBS. His aspiration risk continues to be impacted by his cognitive deficits as well as inadequate management of secretions. Anterior hyoid excursion and epiglottic inversion were both partial in completion but laryngeal vestibule closure was complete. He exhibited two instances of barium contrast entering airway and then fully exiting  when patient coughed(PAS 7). This occured when administering nectar thick and honey thick consistency barium but SLP suspects secretions mixing with barium during these events which occured following initial swallow. No penetration or aspiration observed with thin liquid consistency or puree consistency. Factors that may increase risk of adverse event in presence of aspiration Robert Greene 2021): Reduced cognitive function;Weak cough DIGEST Swallow Severity Rating*  Safety: 2  Efficiency:2  Overall Pharyngeal Swallow Severity:  1: mild; 2: moderate; 3: severe; 4: profound  *The Dynamic Imaging Grade of Swallowing Toxicity is standardized for the head and neck cancer population, however, demonstrates promising clinical applications across populations to standardize the clinical rating of pharyngeal swallow safety and severity.  Swallow Evaluation Recommendations Recommendations: NPO;Free water  protocol after oral care Liquid Administration via: Cup;Straw Medication Administration: Via alternative means Supervision: Full assist for feeding;Full supervision/cueing for swallowing strategies Swallowing strategies  : Slow rate;Small bites/sips Postural changes: Position pt fully upright for meals Oral care recommendations: Oral care BID (2x/day);Oral care before ice chips/water       Norleen IVAR Blase, MA, CCC-SLP Speech Therapy  02/27/2024,1:21 PM

## 2024-02-27 NOTE — Evaluation (Signed)
 Physical Therapy Evaluation Patient Details Name: Robert Greene MRN: [id number] DOB: 1960/04/15 Today's Date: 02/27/2024  History of Present Illness  The pt is a 64 yo male presenting 1/22 with nausea and black tarry stools, in ED pt with run of [facility] and continued bloody stool, s/p upper endoscopy 1/23, hospitalization complicated by acute hepatic encephalopathy and agitation/confusion due to alcohol withdrawal. PMH includes: alcoholism, anxiety, arthritis, cirrhosis, HTN, ILD, kidney stones, RA, and pulmonary fibrosis.  Clinical Impression  Pt yelling out to PT in hallway for help getting up and getting his phone. Pt poor historian, does not know birthdate, knows he is in the hospital but not which one. PT asked home set up questions and when PT asked about steps to his home suggested number of steps from prior hospitalization to which pt answered its more like a shack when asked if it had running water  pt says he does not want to talk about it. Pt lacks full extension of LE even with PT assistance, and abduction of his hips as if he has been sitting for a substantial amount of time. Attempted to stand and pt could not extend LE to come to standing. Pt also not able to coordinate scooting along EoB. Pt will likely need PT at a LTACH. OT orders placed. PT will continue to follow to progress mobility.         If plan is discharge home, recommend the following: Two people to help with walking and/or transfers;Two people to help with bathing/dressing/bathroom;Assistance with cooking/housework;Assistance with feeding;Direct supervision/assist for medications management;Direct supervision/assist for financial management;Assist for transportation;Help with stairs or ramp for entrance;Supervision due to cognitive status   Can travel by private vehicle    NO    Equipment Recommendations Hospital bed;None recommended by PT;Wheelchair (measurements PT);Wheelchair cushion (measurements PT)   Recommendations for Other   Services   No    Functional Status Assessment Patient has had a recent decline in their functional status and demonstrates the ability to make significant improvements in function in a reasonable and predictable amount of time.     Precautions / Restrictions Precautions Precautions: Fall Recall of Precautions/Restrictions: Impaired Restrictions Weight Bearing Restrictions Per Provider Order: No      Mobility  Bed Mobility Overal bed mobility: Needs Assistance Bed Mobility: Supine to Sit, Sit to Supine     Supine to sit: Max assist Sit to supine: Total assist   General bed mobility comments: PT placement of B hands on rail, needs maxA with pad to scoot hips to EoB and bring trunk to upright, total A for returning LE to bed    Transfers Overall transfer level: Needs assistance Equipment used: Rolling walker (2 wheels), 1 person hand held assist Transfers: Sit to/from Stand, Bed to chair/wheelchair/BSC Sit to Stand: Total assist          Lateral/Scoot Transfers: Total assist General transfer comment: pt attempts to come to standing in RW, however pt with very narrow BOS attempt to widen but hips are too tight, attmept to stand and pt knees do not extend, attempted again with face to face assist and knees do not extend, pt reports maybe we shouldn't try this today. , PT attempts to have PT scoot up in the bed, pt attempts but all he does is rock forwards and back cannot make any lateral assist without total A from PT with bed pad.          Balance Overall balance assessment: Needs assistance Sitting-balance support: Feet supported, Bilateral  upper extremity supported Sitting balance-Leahy Scale: Poor Sitting balance - Comments: requires b UE support to maintain his balance     Standing balance-Leahy Scale: Zero                               Pertinent Vitals/Pain Pain Assessment Pain Assessment: Faces Faces Pain Scale:  Hurts a little bit Pain Location: generalized with movement and attempt to straighten LE Pain Descriptors / Indicators: Grimacing, Guarding, Moaning    Home Living Family/patient expects to be discharged to:: Private residence Living Arrangements: Alone                 Additional Comments: poor historian, asked questions appropriate to his living from hospitalization in 12/25, pt reports it's more of a shack when asked about running water  pt reports he does not want to talk about it anymore    Prior Function Prior Level of Function : History of Falls (last six months);Patient poor historian/Family not available             Mobility Comments: reports he holds onto the furniture but doesn't get up a lot       Extremity/Trunk Assessment   Upper Extremity Assessment Upper Extremity Assessment: Defer to OT evaluation    Lower Extremity Assessment Lower Extremity Assessment: RLE deficits/detail;LLE deficits/detail RLE Deficits / Details: pt unable to extend knees,even with maximal assist from PT flexed at approx 60 degrees, hips lack abduction to stand, ankles lack dorsiflexion, poor ability for volitional movement RLE Coordination: decreased fine motor;decreased gross motor LLE Deficits / Details: pt unable to extend knees even with maximal assist from PT, flexed at approx 75 degrees, hips lack abduction to stand, ankles lack dorsiflexion, poor ability for volitional movement LLE Coordination: decreased fine motor;decreased gross motor    Cervical / Trunk Assessment Cervical / Trunk Assessment: Kyphotic  Communication   Communication Communication: Impaired Factors Affecting Communication: Reduced clarity of speech    Cognition Arousal: Alert Behavior During Therapy: Restless, Flat affect, Impulsive   PT - Cognitive impairments: Orientation, Awareness, Memory, Attention, Initiation, Sequencing, Problem solving, Safety/Judgement   Orientation impairments: Person,  Place, Time, Situation                   PT - Cognition Comments: can state his name, thiks he was born in 1932, and can not correct, not sure why he is here. Following commands: Impaired Following commands impaired: Follows one step commands inconsistently     Cueing Cueing Techniques: Verbal cues, Gestural cues, Tactile cues, Visual cues     General Comments General comments (skin integrity, edema, etc.): Pt with global edema, HR in 110s and SoB with all activity at [facility]        Assessment/Plan    PT Assessment Patient needs continued PT services  PT Problem List Decreased strength;Decreased range of motion;Decreased activity tolerance;Decreased balance;Decreased mobility;Decreased coordination;Decreased cognition;Decreased safety awareness;Pain       PT Treatment Interventions DME instruction;Gait training;Functional mobility training;Therapeutic activities;Therapeutic exercise;Balance training;Cognitive remediation;Patient/family education    PT Goals (Current goals can be found in the Care Plan section)  Acute Rehab PT Goals PT Goal Formulation: Patient unable to participate in goal setting Time For Goal Achievement: 03/12/24 Potential to Achieve Goals: Poor    Frequency Min 2X/week        AM-PAC PT 6 Clicks Mobility  Outcome Measure Help needed turning from your back to your side while in a flat  bed without using bedrails?: A Lot Help needed moving from lying on your back to sitting on the side of a flat bed without using bedrails?: Total Help needed moving to and from a bed to a chair (including a wheelchair)?: Total Help needed standing up from a chair using your arms (e.g., wheelchair or bedside chair)?: Total Help needed to walk in hospital room?: Total Help needed climbing 3-5 steps with a railing? : Total 6 Click Score: 7    End of Session Equipment Utilized During Treatment: Gait belt Activity Tolerance: Other (comment) (treatment limited due to  decreased ROM in LE) Patient left: in bed;with call bell/phone within reach;with bed alarm set Nurse Communication: Mobility status;Need for lift equipment PT Visit Diagnosis: Unsteadiness on feet (R26.81);Other abnormalities of gait and mobility (R26.89);Muscle weakness (generalized) (M62.81);History of falling (Z91.81);Difficulty in walking, not elsewhere classified (R26.2);Pain;Adult, failure to thrive (R62.7) Pain - Right/Left:  (bilateal) Pain - part of body: Leg;Knee    Time: 8472-8448 PT Time Calculation (min) (ACUTE ONLY): 24 min   Charges:   PT Evaluation $PT Eval Moderate Complexity: 1 Mod PT Treatments $Therapeutic Activity: 8-22 mins PT General Charges $$ ACUTE PT VISIT: 1 Visit         Aarvi Stotts B. Fleeta Lapidus PT, DPT Acute Rehabilitation Services Please use secure chat or  Call Office [phone]   Almarie KATHEE Fleeta Fleet 02/27/2024, 4:12 PM

## 2024-02-28 LAB — COMPREHENSIVE METABOLIC PANEL WITH GFR
ALT: 34 U/L (ref 0–44)
AST: 60 U/L — ABNORMAL HIGH (ref 15–41)
Albumin: 2.9 g/dL — ABNORMAL LOW (ref 3.5–5.0)
Alkaline Phosphatase: 139 U/L — ABNORMAL HIGH (ref 38–126)
Anion gap: 8 (ref 5–15)
BUN: 12 mg/dL (ref 8–23)
CO2: 20 mmol/L — ABNORMAL LOW (ref 22–32)
Calcium: 8.8 mg/dL — ABNORMAL LOW (ref 8.9–10.3)
Chloride: 111 mmol/L (ref 98–111)
Creatinine, Ser: 0.53 mg/dL — ABNORMAL LOW (ref 0.61–1.24)
GFR, Estimated: >60 mL/min
Glucose, Bld: 100 mg/dL — ABNORMAL HIGH (ref 70–99)
Potassium: 3.6 mmol/L (ref 3.5–5.1)
Sodium: 140 mmol/L (ref 135–145)
Total Bilirubin: 1.3 mg/dL — ABNORMAL HIGH (ref 0.0–1.2)
Total Protein: 6 g/dL — ABNORMAL LOW (ref 6.5–8.1)

## 2024-02-28 LAB — GLUCOSE, CAPILLARY
Glucose-Capillary: 112 mg/dL — ABNORMAL HIGH (ref 70–99)
Glucose-Capillary: 102 mg/dL — ABNORMAL HIGH (ref 70–99)
Glucose-Capillary: 157 mg/dL — ABNORMAL HIGH (ref 70–99)
Glucose-Capillary: 136 mg/dL — ABNORMAL HIGH (ref 70–99)
Glucose-Capillary: 125 mg/dL — ABNORMAL HIGH (ref 70–99)

## 2024-02-28 LAB — CBC
HCT: 29.5 % — ABNORMAL LOW (ref 39.0–52.0)
Hemoglobin: 9 g/dL — ABNORMAL LOW (ref 13.0–17.0)
MCH: 23.7 pg — ABNORMAL LOW (ref 26.0–34.0)
MCHC: 30.5 g/dL (ref 30.0–36.0)
MCV: 77.8 fL — ABNORMAL LOW (ref 80.0–100.0)
Platelets: 134 10*3/uL — ABNORMAL LOW (ref 150–400)
RBC: 3.79 MIL/uL — ABNORMAL LOW (ref 4.22–5.81)
RDW: 23.6 % — ABNORMAL HIGH (ref 11.5–15.5)
WBC: 7.4 10*3/uL (ref 4.0–10.5)
nRBC: 0 % (ref 0.0–0.2)

## 2024-02-28 MED ORDER — FOLIC ACID 1 MG PO TABS
1.0000 mg | ORAL_TABLET | Freq: Every day | ORAL | Status: AC
  Administered 2024-02-28 – 2024-02-29 (×2): 1 mg
  Filled 2024-02-28 (×2): qty 1

## 2024-02-28 MED ORDER — ACETAMINOPHEN 325 MG PO TABS
650.0000 mg | ORAL_TABLET | Freq: Four times a day (QID) | ORAL | Status: AC | PRN
  Administered 2024-02-29: 650 mg
  Filled 2024-02-28: qty 2

## 2024-02-28 MED ORDER — CARVEDILOL 6.25 MG PO TABS
6.2500 mg | ORAL_TABLET | Freq: Two times a day (BID) | ORAL | Status: AC
  Administered 2024-02-28 – 2024-02-29 (×3): 6.25 mg
  Filled 2024-02-28 (×3): qty 1

## 2024-02-28 MED ORDER — PANTOPRAZOLE SODIUM 40 MG IV SOLR
40.0000 mg | Freq: Two times a day (BID) | INTRAVENOUS | Status: AC
  Administered 2024-02-28 – 2024-02-29 (×4): 40 mg via INTRAVENOUS
  Filled 2024-02-28 (×4): qty 10

## 2024-02-28 MED ORDER — ACETAMINOPHEN 650 MG RE SUPP: 650.0000 mg | Freq: Four times a day (QID) | RECTAL | Status: AC | PRN

## 2024-02-28 MED ORDER — LACTULOSE 10 GM/15ML PO SOLN
20.0000 g | Freq: Three times a day (TID) | ORAL | Status: AC
  Administered 2024-02-28 – 2024-02-29 (×2): 20 g
  Filled 2024-02-28 (×2): qty 30

## 2024-02-28 MED ORDER — LACTULOSE 10 GM/15ML PO SOLN: 20.0000 g | Freq: Two times a day (BID) | ORAL | Status: AC | PRN

## 2024-02-28 NOTE — Progress Notes (Signed)
 Speech Language Pathology Treatment: Dysphagia  Patient Details Name: Robert Greene MRN: [id number] DOB: August 12, 1960 Today's Date: 02/28/2024 Time: 1100-1130 SLP Time Calculation (min) (ACUTE ONLY): 30 min  Assessment / Plan / Recommendation Clinical Impression  PLAN: Continue NPO/Cortrak feeds. SLP will follow for dysphagia therapy, PO trials, and to determine readiness to repeat MBS/begin PO intake.  Pt seen at bedside for skilled ST intervention targeting goals for PO readiness and adherence to safe swallow precautions. Suction was set up at bedside to facilitate thorough oral care. Thick voice quality noted during session, raising concern for adequate secretion management. Pt exhibited congested nonproductive cough intermittently throughout session.   After oral care, pt accepted trials of ice chips and ice cream. Cough response was intermittent. Pt appeared to have difficulty performing effortful swallow. He repeatedly asked for bilateral mitts to be removed. Pt with Cortrak in place. Will continue to follow acutely for dysphagia treatment per POC.    HPI HPI: 64yo male admitted 02/14/24 from home with nausea and black tarry stools x3 days. GI Bleed with positive Fecal occult and bloody stool. Upper endoscopy 11/28/23: grade 1 varices in lower third of the esophagus, severe portal hypertensive gastropathy, submucosal polypoid deformity in the second portion of the duodenum. ETOH withdrawal resulting in agitation and confusion with need for Precedex . EGD completed 02/15/24 which showed grade II esophageal varices in middle and lower third of esophagus and portal hypertensive gastropathy. MBS 02/28/24: Mild-moderate oropharyngeal dysphagia. Aspiration risk impacted by cognitive deficits and poor secretion management. Cortrak has been placed. PMH: alcohol abuse w/ hx of alcohol withdrawal, Liver cirrhosis w/ ascites, ILD, tobacco abuse, HTN, HLD, RA.      SLP Plan  Continue with current plan of  care;Goals updated        Swallow Evaluation Recommendations    NPO, PO trials with SLP     Recommendations  Diet recommendations: NPO Medication Administration: Via alternative means        Oral care BID;Staff/trained caregiver to provide oral care   Frequent or constant Supervision/Assistance Dysphagia, unspecified (R13.10)     Continue with current plan of care;Goals updated    Robert Greene, MSP, CCC-SLP Speech Language Pathologist Office: [phone]  Robert Greene 02/28/2024, 11:40 AM
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
## 2024-02-28 NOTE — Plan of Care (Signed)
  Problem: Pain Managment: Goal: General experience of comfort will improve and/or be controlled Outcome: Not Progressing   Problem: Safety: Goal: Ability to remain free from injury will improve Outcome: Not Progressing   Problem: Skin Integrity: Goal: Risk for impaired skin integrity will decrease Outcome: Not Progressing

## 2024-02-28 NOTE — Progress Notes (Signed)
 " PROGRESS NOTE    Robert Greene  FMW:[id number] DOB: 07-02-1960 DOA: 02/14/2024 PCP: [name], MD   Brief Narrative:   64 y.o. male with past medical history  of alcoholism, essential hypertension, hyperlipidemia, rheumatoid arthritis with history of Enbrel, liver cirrhosis and history of decompensated liver cirrhosis with ascites, portal hypertensive gastropathy, history of hepatic encephalopathy, alcohol withdrawal, history of C. difficile colitis, pulmonary fibrosis, medical noncompliance, tobacco abuse,  brought by EMS from home with multiple complaints including weakness, black tarry stools in the setting of alcohol abuse.  S/p EGD done on 02/15/24-Grade II varices in middle third of esophagus and in lower third, not actively bleeding, s/p variceal banding x 4. Moderate portal hypertensive gastropathy.  PCCM consulted for severe alcohol withdrawal symptoms needing precedex  drip and phenobarbital  taper.  TR[facility] assumed care on 1/30  1/30: Cortrack placed because of poor oral intake in the setting of alcohol withdrawal/encephalopathy. On Tube feeds with osmolite.  2/2: Developed fever. Concern for aspiration. Started on broad spectrum antibiotics.    Assessment & Plan:  Principal Problem:   Aspiration pneumonia of right middle lobe [facility]) Active Problems:   Esophageal varices without bleeding (HCC)   Alcoholic cirrhosis (HCC)   GIB (gastrointestinal bleeding)   Hyperammonemia   Alcohol withdrawal syndrome, with delirium (HCC)   Fever Right sided pneumonia, likely due to aspiration Patient developed fever in the early hours of 02/25/2024. Noted to have increased respiratory secretions. Also noted to have dislodged his Dobbhoff tube. Tube has been replaced. CT chest reveals right-sided pneumonia. -Patient started on broad-spectrum antibiotics (vancomycin , Zosyn ), now deescalated  Alcohol withdrawal symptoms Chronic alcohol use  Counseled on alcohol cessation  Continue  thiamine  and folate Transferred to PCCM for phenobarbital  taper and precedex  infusions TRH assumed care on 1/30 Dced phenobarbital  on 1/30 as this was making him too drowsy and confused Off of precedex  Continue CIWA protocol.   Acute blood loss anemia likely secondary to GI bleed Patient with a history of liver cirrhosis and history of decompensated liver cirrhosis with ascites portal hypertensive gastropathy  Hemoglobin noted to be 6.7 upon presentation S/p blood transfusion Continue Protonix  GI consulted, s/p EGD 02/15/24: Grade 2 esophageal varices, incompletely eradicated, banded.  No active bleeding.  Portal hypertensive gastropathy.  GI recommended to continue PPI, no indication for octreotide . Needs repeat EGD in 4 weeks.   Hypokalemia, replete as needed Hypophosphatemia, replete as needed  Hypernatremia: Secondary to dehydration and poor oral intake.  Resolved now   Metabolic acidosis, monitor BMP closely    Acute hepatic encephalopathy Decompensated liver cirrhosis Thrombocytopenia likely secondary to alcohol and cirrhosis Continue with lactulose  and rifaximin . GI On board  Dysphagia DHT placed on 1/30 (replaced on 2/2). RD on board. On osmolite 1.5 at 55 cc/h SLP following.  Patient has not yet been cleared for oral intake-appears to have poor secretion management. - Continue tube feeds via Dobbhoff. - Repeat swallow evaluation when appropriate  Essential hypertension Coreg  6.25 mg PO BID  Hyperlipidemia: resume statin when appropriate   History of rheumatoid arthritis Outpatient follow-up  Disposition:  Seen by PT. LTACH recommended.      DVT prophylaxis: SCDs Start: 02/14/24 1902     Code Status: Full Code  Family Communication: n/a  Status is: Inpatient Remains inpatient appropriate because: acute encephalopathy, poor oral intake on tube feeds, alcohol withdrawal  Subjective: Patient seems more alert today.   Examination:  General: Awake,  alert, responsive Eyes: Pupils equal, reactive  Oral cavity: moist mucous membranes  DHT in place Head: Atraumatic, normocephalic  Neck: supple  Chest: clear to auscultation. No crackles, no wheezes  CVS: S1,S2 RRR. No murmurs  Abd: No distention, soft, non-tender. No masses palpable  Extr: No edema   MSK: No joint deformities or swelling  Neurological: Grossly intact.   Wound 02/27/24 1858 Pressure Injury Heel Left Stage 1 -  Intact skin with non-blanchable redness of a localized area usually over a bony prominence. (Active)     Diet Orders (From admission, onward)     Start     Ordered   02/25/24 1147  Diet NPO time specified Except for: Ice Chips  Diet effective now       Question:  Except for  Answer:  Ice Chips   02/25/24 1146            Objective: Vitals:   02/28/24 0500 02/28/24 0532 02/28/24 0750 02/28/24 1130  BP:  133/88 (!) 159/76 114/88  Pulse:   (!) 119 (!) 124  Resp:   20 20  Temp:   99.1 F (37.3 C) 98.6 F (37 C)  TempSrc:      SpO2:  99% 97% 100%  Weight: 92.1 kg     Height:        Intake/Output Summary (Last 24 hours) at 02/28/2024 1426 Last data filed at 02/28/2024 1200 Gross per 24 hour  Intake 660 ml  Output --  Net 660 ml   Filed Weights   02/26/24 0304 02/27/24 0500 02/28/24 0500  Weight: 95.4 kg 93.7 kg 92.1 kg    Scheduled Meds:  acetaminophen   325 mg Oral Once   carvedilol   6.25 mg Per Tube BID WC   feeding supplement (PROSource TF20)  60 mL Per Tube Daily   folic acid   1 mg Per Tube Daily   free water   165 mL Per Tube Q4H   lactulose   20 g Per Tube TID   multivitamin with minerals  1 tablet Per Tube Daily   nicotine   21 mg Transdermal Daily   pantoprazole  (PROTONIX ) IV  40 mg Intravenous Q12H   rifaximin   550 mg Per Tube BID   thiamine   100 mg Oral Daily   Continuous Infusions:  feeding supplement (OSMOLITE 1.5 CAL) 1,000 mL (02/28/24 9161)   piperacillin -tazobactam (ZOSYN )  IV 3.375 g (02/28/24 0902)    Nutritional  status Signs/Symptoms: NPO status Interventions: Refer to RD note for recommendations Body mass index is 29.98 kg/m.  Data Reviewed:   CBC: Recent Labs  Lab 02/23/24 0311 02/25/24 1021 02/26/24 0330 02/28/24 0142  WBC 6.0 11.3* 8.2 7.4  NEUTROABS 3.6 8.4*  --   --   HGB 8.7* 8.9* 9.0* 9.0*  HCT 28.1* 28.0* 29.1* 29.5*  MCV 77.8* 77.8* 78.4* 77.8*  PLT 67* 97* 97* 134*   Basic Metabolic Panel: Recent Labs  Lab 02/22/24 1444 02/23/24 0311 02/24/24 0200 02/25/24 0157 02/25/24 1021 02/26/24 0330 02/28/24 0142  NA  --  138  --   --  141 142 140  K  --  3.6  --   --  3.8 3.7 3.6  CL  --  110  --   --  113* 115* 111  CO2  --  18*  --   --  18* 19* 20*  GLUCOSE  --  135*  --   --  114* 123* 100*  BUN  --  12  --   --  14 14 12   CREATININE  --  0.64  --   --  0.71 0.60* 0.53*  CALCIUM   --  7.9*  --   --  8.6* 8.7* 8.8*  MG 2.1 2.1 2.1 2.3  --   --   --   PHOS 2.1* 2.0* 2.0* 1.9*  --   --   --    GFR: Estimated Creatinine Clearance: 106 mL/min (A) (by C-G formula based on SCr of 0.53 mg/dL (L)). Liver Function Tests: Recent Labs  Lab 02/25/24 1021 02/26/24 0330 02/28/24 0142  AST 42* 42* 60*  ALT 26 26 34  ALKPHOS 118 123 139*  BILITOT 1.6* 1.4* 1.3*  PROT 5.8* 6.0* 6.0*  ALBUMIN  2.8* 2.9* 2.9*   No results for input(s): LIPASE, AMYLASE in the last 168 hours. No results for input(s): AMMONIA in the last 168 hours.  Coagulation Profile: No results for input(s): INR, PROTIME in the last 168 hours. Cardiac Enzymes: No results for input(s): CKTOTAL, CKMB, CKMBINDEX, TROPONINI in the last 168 hours. BNP (last 3 results) No results for input(s): PROBNP in the last 8760 hours. HbA1C: No results for input(s): HGBA1C in the last 72 hours. CBG: Recent Labs  Lab 02/27/24 2004 02/27/24 2248 02/28/24 0532 02/28/24 0839 02/28/24 1131  GLUCAP 126* 108* 112* 102* 157*   Lipid Profile: No results for input(s): CHOL, HDL, LDLCALC,  TRIG, CHOLHDL, LDLDIRECT in the last 72 hours. Thyroid  Function Tests: No results for input(s): TSH, T4TOTAL, FREET4, T3FREE, THYROIDAB in the last 72 hours. Anemia Panel: No results for input(s): VITAMINB12, FOLATE, FERRITIN, TIBC, IRON , RETICCTPCT in the last 72 hours. Sepsis Labs: No results for input(s): PROCALCITON, LATICACIDVEN in the last 168 hours.  Recent Results (from the past 240 hours)  Culture, blood (Routine X 2) w Reflex to ID Panel     Status: None (Preliminary result)   Collection Time: 02/25/24  1:57 AM   Specimen: BLOOD  Result Value Ref Range Status   Specimen Description BLOOD SITE NOT SPECIFIED  Final   Special Requests   Final    BOTTLES DRAWN AEROBIC AND ANAEROBIC Blood Culture adequate volume   Culture   Final    NO GROWTH 3 DAYS Performed at [facility], [address], [place], [place] [address]    Report Status PENDING  Incomplete  Culture, blood (Routine X 2) w Reflex to ID Panel     Status: None (Preliminary result)   Collection Time: 02/25/24  1:58 AM   Specimen: BLOOD  Result Value Ref Range Status   Specimen Description BLOOD SITE NOT SPECIFIED  Final   Special Requests   Final    BOTTLES DRAWN AEROBIC AND ANAEROBIC Blood Culture results may not be optimal due to an inadequate volume of blood received in culture bottles   Culture   Final    NO GROWTH 3 DAYS Performed at [facility], [address] [place], [place] [address]    Report Status PENDING  Incomplete       LOS: 14 days   MDALA-GAUSI, GOLDEN PILLOW, MD [facility]  If 7PM-7AM, please contact night-coverage  02/28/2024, 2:26 PM  "

## 2024-02-29 LAB — CULTURE, BLOOD (ROUTINE X 2)
Culture: NO GROWTH
Culture: NO GROWTH
Special Requests: ADEQUATE

## 2024-02-29 LAB — GLUCOSE, CAPILLARY
Glucose-Capillary: 123 mg/dL — ABNORMAL HIGH (ref 70–99)
Glucose-Capillary: 132 mg/dL — ABNORMAL HIGH (ref 70–99)
Glucose-Capillary: 146 mg/dL — ABNORMAL HIGH (ref 70–99)
Glucose-Capillary: 149 mg/dL — ABNORMAL HIGH (ref 70–99)
Glucose-Capillary: 149 mg/dL — ABNORMAL HIGH (ref 70–99)
Glucose-Capillary: 152 mg/dL — ABNORMAL HIGH (ref 70–99)

## 2024-02-29 MED ORDER — ORAL CARE MOUTH RINSE
15.0000 mL | OROMUCOSAL | Status: AC
  Administered 2024-02-29 (×2): 15 mL via OROMUCOSAL

## 2024-02-29 NOTE — Plan of Care (Signed)
  Problem: Clinical Measurements: Goal: Will remain free from infection Outcome: Progressing Goal: Diagnostic test results will improve Outcome: Progressing Goal: Respiratory complications will improve Outcome: Progressing Goal: Cardiovascular complication will be avoided Outcome: Progressing   Problem: Nutrition: Goal: Adequate nutrition will be maintained Outcome: Progressing   Problem: Coping: Goal: Level of anxiety will decrease Outcome: Progressing   Problem: Elimination: Goal: Will not experience complications related to urinary retention Outcome: Progressing   Problem: Pain Managment: Goal: General experience of comfort will improve and/or be controlled Outcome: Progressing   Problem: Safety: Goal: Ability to remain free from injury will improve Outcome: Progressing   Problem: Skin Integrity: Goal: Risk for impaired skin integrity will decrease Outcome: Progressing

## 2024-02-29 NOTE — Progress Notes (Signed)
 " PROGRESS NOTE    Robert Greene  FMW:[id number] DOB: 07-27-1960 DOA: 02/14/2024 PCP: [name], MD   Brief Narrative:   64 y.o. male with past medical history  of alcoholism, essential hypertension, hyperlipidemia, rheumatoid arthritis with history of Enbrel, liver cirrhosis and history of decompensated liver cirrhosis with ascites, portal hypertensive gastropathy, history of hepatic encephalopathy, alcohol withdrawal, history of C. difficile colitis, pulmonary fibrosis, medical noncompliance, tobacco abuse,  brought by EMS from home with multiple complaints including weakness, black tarry stools in the setting of alcohol abuse.  S/p EGD done on 02/15/24-Grade II varices in middle third of esophagus and in lower third, not actively bleeding, s/p variceal banding x 4. Moderate portal hypertensive gastropathy.  PCCM consulted for severe alcohol withdrawal symptoms needing precedex  drip and phenobarbital  taper.  TRH assumed care on 1/30  1/30: Cortrack placed because of poor oral intake in the setting of alcohol withdrawal/encephalopathy. On Tube feeds with osmolite.  2/2: Developed fever. Concern for aspiration. Started on broad spectrum antibiotics.    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 Noted to have increased respiratory secretions. Also noted to have dislodged his Dobbhoff tube. Tube has been replaced. CT chest reveals right-sided pneumonia. -Patient started on broad-spectrum antibiotics (vancomycin , Zosyn ), now deescalated. Plan to complete 7 days of treatment.   Alcohol withdrawal symptoms Chronic alcohol use   Counseled on alcohol cessation  Continue thiamine  and folate Transferred to PCCM for phenobarbital  taper and precedex  infusions TRH assumed care on 1/30 Dced phenobarbital  on 1/30 as this was making him too drowsy and confused Off of precedex  Continue CIWA protocol.   Acute blood loss anemia likely secondary to GI bleed Patient with a history of liver cirrhosis and history of decompensated liver cirrhosis with ascites portal hypertensive gastropathy  Hemoglobin noted to be 6.7 upon presentation S/p blood transfusion Continue Protonix  GI consulted, s/p EGD 02/15/24: Grade 2 esophageal varices, incompletely eradicated, banded.  No active bleeding.  Portal hypertensive gastropathy.  GI recommended to continue PPI, no indication for octreotide . Needs repeat EGD in 4 weeks.   Hypokalemia, replete as needed Hypophosphatemia, replete as needed  Hypernatremia: Secondary to dehydration and poor oral intake.  Resolved now   Metabolic acidosis, monitor BMP closely    Acute hepatic encephalopathy Decompensated liver cirrhosis Thrombocytopenia likely secondary to alcohol and cirrhosis Continue with lactulose  and rifaximin . GI On board  Dysphagia DHT placed on 1/30 (replaced on 2/2). RD on board. On osmolite 1.5 at 55 cc/h SLP following.  Patient has not yet been cleared for oral intake-appears to have poor secretion management. - Continue tube feeds via Dobbhoff. - Repeat swallow evaluation when appropriate  Essential hypertension Coreg  6.25 mg PO BID  Hyperlipidemia: resume statin when appropriate   History of rheumatoid arthritis Outpatient follow-up  Disposition:  Seen by PT. LTACH recommended.      DVT prophylaxis: SCDs Start: 02/14/24 1902     Code Status: Full Code  Family Communication: n/a  Status is: Inpatient Remains inpatient appropriate because: acute encephalopathy, poor oral intake on tube feeds, alcohol withdrawal  Subjective: Patient seems more alert  today.   Examination:  General: Awake, alert, responsive Eyes: Pupils  equal, reactive  Oral cavity: moist mucous membranes  DHT in place Head: Atraumatic, normocephalic  Neck: supple  Chest: clear to auscultation. No crackles, no wheezes  CVS: S1,S2 RRR. No murmurs  Abd: No distention, soft, non-tender. No masses palpable  Extr: No edema   MSK: No joint deformities or swelling  Neurological: Grossly intact.   Wound 02/27/24 1858 Pressure Injury Heel Left Stage 1 -  Intact skin with non-blanchable redness of a localized area usually over a bony prominence. (Active)     Diet Orders (From admission, onward)     Start     Ordered   02/25/24 1147  Diet NPO time specified Except for: Ice Chips  Diet effective now       Question:  Except for  Answer:  Ice Chips   02/25/24 1146            Objective: Vitals:   02/29/24 0633 02/29/24 0706 02/29/24 0809 02/29/24 1200  BP:   (!) 156/92 132/72  Pulse: 87  100 98  Resp:   16   Temp:   (!) 100.8 F (38.2 C) 99.1 F (37.3 C)  TempSrc:      SpO2:   95% 95%  Weight:  91.3 kg    Height:        Intake/Output Summary (Last 24 hours) at 02/29/2024 1322 Last data filed at 02/29/2024 1245 Gross per 24 hour  Intake 1465 ml  Output 600 ml  Net 865 ml   Filed Weights   02/27/24 0500 02/28/24 0500 02/29/24 0706  Weight: 93.7 kg 92.1 kg 91.3 kg    Scheduled Meds:  carvedilol   6.25 mg Per Tube BID WC   feeding supplement (PROSource TF20)  60 mL Per Tube Daily   folic acid   1 mg Per Tube Daily   free water   165 mL Per Tube Q4H   lactulose   20 g Per Tube TID   multivitamin with minerals  1 tablet Per Tube Daily   nicotine   21 mg Transdermal Daily   mouth rinse  15 mL Mouth Rinse 4 times per day   pantoprazole  (PROTONIX ) IV  40 mg Intravenous Q12H   rifaximin   550 mg Per Tube BID   thiamine   100 mg Oral Daily   Continuous Infusions:  feeding supplement (OSMOLITE 1.5 CAL) 55 mL/hr at 02/28/24 1615   piperacillin -tazobactam  (ZOSYN )  IV 3.375 g (02/29/24 9166)    Nutritional status Signs/Symptoms: NPO status Interventions: Refer to RD note for recommendations Body mass index is 29.72 kg/m.  Data Reviewed:   CBC: Recent Labs  Lab 02/23/24 0311 02/25/24 1021 02/26/24 0330 02/28/24 0142  WBC 6.0 11.3* 8.2 7.4  NEUTROABS 3.6 8.4*  --   --   HGB 8.7* 8.9* 9.0* 9.0*  HCT 28.1* 28.0* 29.1* 29.5*  MCV 77.8* 77.8* 78.4* 77.8*  PLT 67* 97* 97* 134*   Basic Metabolic Panel: Recent Labs  Lab 02/22/24 1444 02/23/24 0311 02/24/24 0200 02/25/24 0157 02/25/24 1021 02/26/24 0330 02/28/24 0142  NA  --  138  --   --  141 142 140  K  --  3.6  --   --  3.8 3.7 3.6  CL  --  110  --   --  113* 115* 111  CO2  --  18*  --   --  18* 19* 20*  GLUCOSE  --  135*  --   --  114* 123* 100*  BUN  --  12  --   --  14 14 12   CREATININE  --  0.64  --   --  0.71 0.60* 0.53*  CALCIUM   --  7.9*  --   --  8.6* 8.7* 8.8*  MG 2.1 2.1 2.1 2.3  --   --   --   PHOS 2.1* 2.0* 2.0* 1.9*  --   --   --    GFR: Estimated Creatinine Clearance: 105.5 mL/min (A) (by C-G formula based on SCr of 0.53 mg/dL (L)). Liver Function Tests: Recent Labs  Lab 02/25/24 1021 02/26/24 0330 02/28/24 0142  AST 42* 42* 60*  ALT 26 26 34  ALKPHOS 118 123 139*  BILITOT 1.6* 1.4* 1.3*  PROT 5.8* 6.0* 6.0*  ALBUMIN  2.8* 2.9* 2.9*   No results for input(s): LIPASE, AMYLASE in the last 168 hours. No results for input(s): AMMONIA in the last 168 hours.  Coagulation Profile: No results for input(s): INR, PROTIME in the last 168 hours. Cardiac Enzymes: No results for input(s): CKTOTAL, CKMB, CKMBINDEX, TROPONINI in the last 168 hours. BNP (last 3 results) No results for input(s): PROBNP in the last 8760 hours. HbA1C: No results for input(s): HGBA1C in the last 72 hours. CBG: Recent Labs  Lab 02/28/24 2107 02/29/24 0051 02/29/24 0547 02/29/24 0805 02/29/24 1159  GLUCAP 125* 132* 149* 146* 152*   Lipid  Profile: No results for input(s): CHOL, HDL, LDLCALC, TRIG, CHOLHDL, LDLDIRECT in the last 72 hours. Thyroid  Function Tests: No results for input(s): TSH, T4TOTAL, FREET4, T3FREE, THYROIDAB in the last 72 hours. Anemia Panel: No results for input(s): VITAMINB12, FOLATE, FERRITIN, TIBC, IRON , RETICCTPCT in the last 72 hours. Sepsis Labs: No results for input(s): PROCALCITON, LATICACIDVEN in the last 168 hours.  Recent Results (from the past 240 hours)  Culture, blood (Routine X 2) w Reflex to ID Panel     Status: None (Preliminary result)   Collection Time: 02/25/24  1:57 AM   Specimen: BLOOD  Result Value Ref Range Status   Specimen Description BLOOD SITE NOT SPECIFIED  Final   Special Requests   Final    BOTTLES DRAWN AEROBIC AND ANAEROBIC Blood Culture adequate volume   Culture   Final    NO GROWTH 4 DAYS Performed at [facility], [address] [place], [place] [address]    Report Status PENDING  Incomplete  Culture, blood (Routine X 2) w Reflex to ID Panel     Status: None (Preliminary result)   Collection Time: 02/25/24  1:58 AM   Specimen: BLOOD  Result Value Ref Range Status   Specimen Description BLOOD SITE NOT SPECIFIED  Final   Special Requests   Final    BOTTLES DRAWN AEROBIC AND ANAEROBIC Blood Culture results may not be optimal due to an inadequate volume of blood received in culture bottles   Culture   Final    NO GROWTH 4 DAYS Performed at [facility], [address], [place], [place] [address]    Report Status PENDING  Incomplete       LOS: 15 days   MDALA-GAUSI, GOLDEN PILLOW, MD [facility]  If 7PM-7AM, please contact night-coverage  02/29/2024, 1:22 PM  "

## 2024-03-24 ENCOUNTER — Ambulatory Visit: Admitting: Gastroenterology

## 2024-04-17 ENCOUNTER — Encounter (HOSPITAL_COMMUNITY): Payer: Self-pay

## 2024-04-17 ENCOUNTER — Ambulatory Visit (HOSPITAL_COMMUNITY): Admit: 2024-04-17 | Admitting: Gastroenterology
